# Patient Record
Sex: Female | Born: 1970 | Race: Black or African American | Hispanic: No | Smoking: Never smoker
Health system: Southern US, Community
[De-identification: ages and names within clinical notes are randomized; demographics above are authoritative.]

## PROBLEM LIST (undated history)

## (undated) DIAGNOSIS — C801 Malignant (primary) neoplasm, unspecified: Secondary | ICD-10-CM

## (undated) DIAGNOSIS — N200 Calculus of kidney: Secondary | ICD-10-CM

## (undated) DIAGNOSIS — D496 Neoplasm of unspecified behavior of brain: Secondary | ICD-10-CM

## (undated) DIAGNOSIS — J45909 Unspecified asthma, uncomplicated: Secondary | ICD-10-CM

## (undated) DIAGNOSIS — E079 Disorder of thyroid, unspecified: Secondary | ICD-10-CM

## (undated) DIAGNOSIS — E559 Vitamin D deficiency, unspecified: Secondary | ICD-10-CM

## (undated) DIAGNOSIS — R490 Dysphonia: Secondary | ICD-10-CM

## (undated) DIAGNOSIS — I10 Essential (primary) hypertension: Secondary | ICD-10-CM

## (undated) DIAGNOSIS — I2699 Other pulmonary embolism without acute cor pulmonale: Secondary | ICD-10-CM

## (undated) DIAGNOSIS — R519 Headache, unspecified: Secondary | ICD-10-CM

## (undated) DIAGNOSIS — IMO0002 Reserved for concepts with insufficient information to code with codable children: Secondary | ICD-10-CM

## (undated) DIAGNOSIS — N390 Urinary tract infection, site not specified: Secondary | ICD-10-CM

## (undated) DIAGNOSIS — IMO0001 Reserved for inherently not codable concepts without codable children: Secondary | ICD-10-CM

## (undated) DIAGNOSIS — E669 Obesity, unspecified: Secondary | ICD-10-CM

## (undated) DIAGNOSIS — O009 Unspecified ectopic pregnancy without intrauterine pregnancy: Secondary | ICD-10-CM

## (undated) DIAGNOSIS — F419 Anxiety disorder, unspecified: Secondary | ICD-10-CM

## (undated) DIAGNOSIS — D649 Anemia, unspecified: Secondary | ICD-10-CM

## (undated) DIAGNOSIS — T4145XA Adverse effect of unspecified anesthetic, initial encounter: Secondary | ICD-10-CM

## (undated) DIAGNOSIS — E215 Disorder of parathyroid gland, unspecified: Secondary | ICD-10-CM

## (undated) DIAGNOSIS — F32A Depression, unspecified: Secondary | ICD-10-CM

## (undated) DIAGNOSIS — D332 Benign neoplasm of brain, unspecified: Secondary | ICD-10-CM

## (undated) DIAGNOSIS — F329 Major depressive disorder, single episode, unspecified: Secondary | ICD-10-CM

## (undated) DIAGNOSIS — R51 Headache: Secondary | ICD-10-CM

## (undated) HISTORY — DX: Benign neoplasm of brain, unspecified: D33.2

## (undated) HISTORY — DX: Unspecified ectopic pregnancy without intrauterine pregnancy: O00.90

## (undated) HISTORY — PX: OTHER SURGICAL HISTORY: SHX169

## (undated) HISTORY — DX: Other pulmonary embolism without acute cor pulmonale: I26.99

## (undated) HISTORY — PX: LITHOTRIPSY: SUR834

## (undated) HISTORY — DX: Disorder of thyroid, unspecified: E07.9

## (undated) HISTORY — PX: CYSTOSCOPY W/ URETEROSCOPY: SUR379

---

## 1997-12-18 DIAGNOSIS — T8859XA Other complications of anesthesia, initial encounter: Secondary | ICD-10-CM

## 1997-12-18 HISTORY — DX: Other complications of anesthesia, initial encounter: T88.59XA

## 1997-12-18 HISTORY — PX: CHOLECYSTECTOMY: SHX55

## 2009-12-18 DIAGNOSIS — I2699 Other pulmonary embolism without acute cor pulmonale: Secondary | ICD-10-CM

## 2009-12-18 HISTORY — DX: Other pulmonary embolism without acute cor pulmonale: I26.99

## 2009-12-18 HISTORY — PX: OTHER SURGICAL HISTORY: SHX169

## 2009-12-18 HISTORY — PX: TUBAL LIGATION: SHX77

## 2010-10-14 ENCOUNTER — Emergency Department (HOSPITAL_COMMUNITY): Admission: EM | Admit: 2010-10-14 | Discharge: 2010-10-15 | Payer: Self-pay | Admitting: Emergency Medicine

## 2010-11-21 ENCOUNTER — Emergency Department (HOSPITAL_COMMUNITY): Admission: EM | Admit: 2010-11-21 | Discharge: 2010-11-21 | Payer: Self-pay | Admitting: Emergency Medicine

## 2010-12-15 ENCOUNTER — Emergency Department (HOSPITAL_COMMUNITY)
Admission: EM | Admit: 2010-12-15 | Discharge: 2010-12-15 | Payer: Self-pay | Source: Home / Self Care | Admitting: Emergency Medicine

## 2010-12-28 ENCOUNTER — Emergency Department (HOSPITAL_COMMUNITY)
Admission: EM | Admit: 2010-12-28 | Discharge: 2010-12-28 | Payer: Self-pay | Source: Home / Self Care | Admitting: Family Medicine

## 2011-01-02 LAB — POCT I-STAT, CHEM 8
BUN: 5 mg/dL — ABNORMAL LOW (ref 6–23)
Calcium, Ion: 1.36 mmol/L — ABNORMAL HIGH (ref 1.12–1.32)
Chloride: 105 mEq/L (ref 96–112)
Creatinine, Ser: 0.8 mg/dL (ref 0.4–1.2)
Glucose, Bld: 105 mg/dL — ABNORMAL HIGH (ref 70–99)
HCT: 43 % (ref 36.0–46.0)
Hemoglobin: 14.6 g/dL (ref 12.0–15.0)
Potassium: 4.2 mEq/L (ref 3.5–5.1)
Sodium: 141 mEq/L (ref 135–145)
TCO2: 30 mmol/L (ref 0–100)

## 2011-01-02 LAB — PROTIME-INR
INR: 1.38 (ref 0.00–1.49)
Prothrombin Time: 17.2 seconds — ABNORMAL HIGH (ref 11.6–15.2)

## 2011-02-06 ENCOUNTER — Ambulatory Visit: Payer: Self-pay

## 2011-02-07 ENCOUNTER — Emergency Department (HOSPITAL_COMMUNITY): Payer: Medicaid Other

## 2011-02-07 ENCOUNTER — Emergency Department (HOSPITAL_COMMUNITY)
Admission: EM | Admit: 2011-02-07 | Discharge: 2011-02-07 | Disposition: A | Discharge status: Home / Self Care | Payer: Medicaid Other | Attending: Emergency Medicine | Admitting: Emergency Medicine

## 2011-02-07 DIAGNOSIS — Z86711 Personal history of pulmonary embolism: Secondary | ICD-10-CM | POA: Insufficient documentation

## 2011-02-07 DIAGNOSIS — M545 Low back pain, unspecified: Secondary | ICD-10-CM | POA: Insufficient documentation

## 2011-02-07 DIAGNOSIS — E7800 Pure hypercholesterolemia, unspecified: Secondary | ICD-10-CM | POA: Insufficient documentation

## 2011-02-07 DIAGNOSIS — J45909 Unspecified asthma, uncomplicated: Secondary | ICD-10-CM | POA: Insufficient documentation

## 2011-02-07 DIAGNOSIS — R109 Unspecified abdominal pain: Secondary | ICD-10-CM | POA: Insufficient documentation

## 2011-02-07 DIAGNOSIS — E669 Obesity, unspecified: Secondary | ICD-10-CM | POA: Insufficient documentation

## 2011-02-07 DIAGNOSIS — E119 Type 2 diabetes mellitus without complications: Secondary | ICD-10-CM | POA: Insufficient documentation

## 2011-02-07 DIAGNOSIS — Z7901 Long term (current) use of anticoagulants: Secondary | ICD-10-CM | POA: Insufficient documentation

## 2011-02-07 DIAGNOSIS — I10 Essential (primary) hypertension: Secondary | ICD-10-CM | POA: Insufficient documentation

## 2011-02-07 DIAGNOSIS — Z79899 Other long term (current) drug therapy: Secondary | ICD-10-CM | POA: Insufficient documentation

## 2011-02-07 LAB — URINALYSIS, ROUTINE W REFLEX MICROSCOPIC
Bilirubin Urine: NEGATIVE
Hgb urine dipstick: NEGATIVE
Ketones, ur: NEGATIVE mg/dL
Nitrite: NEGATIVE
Protein, ur: NEGATIVE mg/dL
Specific Gravity, Urine: 1.021 (ref 1.005–1.030)
Urine Glucose, Fasting: NEGATIVE mg/dL
Urobilinogen, UA: 1 mg/dL (ref 0.0–1.0)
pH: 6.5 (ref 5.0–8.0)

## 2011-02-07 LAB — COMPREHENSIVE METABOLIC PANEL
ALT: 14 U/L (ref 0–35)
AST: 14 U/L (ref 0–37)
Albumin: 3.8 g/dL (ref 3.5–5.2)
Alkaline Phosphatase: 112 U/L (ref 39–117)
BUN: 8 mg/dL (ref 6–23)
CO2: 27 mEq/L (ref 19–32)
Calcium: 10.6 mg/dL — ABNORMAL HIGH (ref 8.4–10.5)
Chloride: 105 mEq/L (ref 96–112)
Creatinine, Ser: 0.71 mg/dL (ref 0.4–1.2)
GFR calc Af Amer: >60 mL/min (ref >60)
GFR calc non Af Amer: >60 mL/min (ref >60)
Glucose, Bld: 101 mg/dL — ABNORMAL HIGH (ref 70–99)
Potassium: 4.2 mEq/L (ref 3.5–5.1)
Sodium: 136 mEq/L (ref 135–145)
Total Bilirubin: 0.3 mg/dL (ref 0.3–1.2)
Total Protein: 8 g/dL (ref 6.0–8.3)

## 2011-02-07 LAB — CBC
HCT: 42.9 % (ref 36.0–46.0)
Hemoglobin: 13.3 g/dL (ref 12.0–15.0)
MCH: 26.8 pg (ref 26.0–34.0)
MCHC: 31 g/dL (ref 30.0–36.0)
MCV: 86.3 fL (ref 78.0–100.0)
Platelets: 363 10*3/uL (ref 150–400)
RBC: 4.97 MIL/uL (ref 3.87–5.11)
RDW: 14.4 % (ref 11.5–15.5)
WBC: 8.3 10*3/uL (ref 4.0–10.5)

## 2011-02-07 LAB — PROTIME-INR
INR: 1.09 (ref 0.00–1.49)
Prothrombin Time: 14.3 seconds (ref 11.6–15.2)

## 2011-02-07 LAB — DIFFERENTIAL
Basophils Absolute: 0.1 10*3/uL (ref 0.0–0.1)
Basophils Relative: 1 % (ref 0–1)
Eosinophils Absolute: 0.2 10*3/uL (ref 0.0–0.7)
Eosinophils Relative: 2 % (ref 0–5)
Lymphocytes Relative: 40 % (ref 12–46)
Lymphs Abs: 3.3 10*3/uL (ref 0.7–4.0)
Monocytes Absolute: 0.6 10*3/uL (ref 0.1–1.0)
Monocytes Relative: 8 % (ref 3–12)
Neutro Abs: 4.1 10*3/uL (ref 1.7–7.7)
Neutrophils Relative %: 49 % (ref 43–77)

## 2011-02-07 LAB — POCT PREGNANCY, URINE
Preg Test, Ur: NEGATIVE
Preg Test, Ur: NEGATIVE

## 2011-02-07 LAB — LIPASE, BLOOD: Lipase: 22 U/L (ref 11–59)

## 2011-02-20 ENCOUNTER — Emergency Department (HOSPITAL_COMMUNITY)
Admission: EM | Admit: 2011-02-20 | Discharge: 2011-02-21 | Disposition: A | Discharge status: Home / Self Care | Payer: Medicaid Other | Attending: Emergency Medicine | Admitting: Emergency Medicine

## 2011-02-20 DIAGNOSIS — H9209 Otalgia, unspecified ear: Secondary | ICD-10-CM | POA: Insufficient documentation

## 2011-02-20 DIAGNOSIS — J45909 Unspecified asthma, uncomplicated: Secondary | ICD-10-CM | POA: Insufficient documentation

## 2011-02-20 DIAGNOSIS — E119 Type 2 diabetes mellitus without complications: Secondary | ICD-10-CM | POA: Insufficient documentation

## 2011-02-20 DIAGNOSIS — Z7901 Long term (current) use of anticoagulants: Secondary | ICD-10-CM | POA: Insufficient documentation

## 2011-02-20 DIAGNOSIS — R070 Pain in throat: Secondary | ICD-10-CM | POA: Insufficient documentation

## 2011-02-20 DIAGNOSIS — E7800 Pure hypercholesterolemia, unspecified: Secondary | ICD-10-CM | POA: Insufficient documentation

## 2011-02-20 DIAGNOSIS — J020 Streptococcal pharyngitis: Secondary | ICD-10-CM | POA: Insufficient documentation

## 2011-02-20 DIAGNOSIS — J351 Hypertrophy of tonsils: Secondary | ICD-10-CM | POA: Insufficient documentation

## 2011-02-20 DIAGNOSIS — R599 Enlarged lymph nodes, unspecified: Secondary | ICD-10-CM | POA: Insufficient documentation

## 2011-02-20 DIAGNOSIS — R6883 Chills (without fever): Secondary | ICD-10-CM | POA: Insufficient documentation

## 2011-02-20 DIAGNOSIS — Z86718 Personal history of other venous thrombosis and embolism: Secondary | ICD-10-CM | POA: Insufficient documentation

## 2011-02-20 DIAGNOSIS — Z79899 Other long term (current) drug therapy: Secondary | ICD-10-CM | POA: Insufficient documentation

## 2011-02-20 DIAGNOSIS — R112 Nausea with vomiting, unspecified: Secondary | ICD-10-CM | POA: Insufficient documentation

## 2011-02-20 DIAGNOSIS — H6690 Otitis media, unspecified, unspecified ear: Secondary | ICD-10-CM | POA: Insufficient documentation

## 2011-02-20 DIAGNOSIS — R1310 Dysphagia, unspecified: Secondary | ICD-10-CM | POA: Insufficient documentation

## 2011-02-20 LAB — POCT I-STAT, CHEM 8
BUN: 7 mg/dL (ref 6–23)
Calcium, Ion: 1.38 mmol/L — ABNORMAL HIGH (ref 1.12–1.32)
Chloride: 105 mEq/L (ref 96–112)
Creatinine, Ser: 0.9 mg/dL (ref 0.4–1.2)
Glucose, Bld: 105 mg/dL — ABNORMAL HIGH (ref 70–99)
HCT: 40 % (ref 36.0–46.0)
Hemoglobin: 13.6 g/dL (ref 12.0–15.0)
Potassium: 4.3 mEq/L (ref 3.5–5.1)
Sodium: 139 mEq/L (ref 135–145)
TCO2: 25 mmol/L (ref 0–100)

## 2011-02-20 LAB — CBC
HCT: 38.2 % (ref 36.0–46.0)
Hemoglobin: 12.3 g/dL (ref 12.0–15.0)
MCH: 27.3 pg (ref 26.0–34.0)
MCHC: 32.2 g/dL (ref 30.0–36.0)
MCV: 84.9 fL (ref 78.0–100.0)
Platelets: 328 10*3/uL (ref 150–400)
RBC: 4.5 MIL/uL (ref 3.87–5.11)
RDW: 14.5 % (ref 11.5–15.5)
WBC: 12.2 10*3/uL — ABNORMAL HIGH (ref 4.0–10.5)

## 2011-02-20 LAB — DIFFERENTIAL
Basophils Absolute: 0.1 10*3/uL (ref 0.0–0.1)
Basophils Relative: 1 % (ref 0–1)
Eosinophils Absolute: 0.2 10*3/uL (ref 0.0–0.7)
Eosinophils Relative: 2 % (ref 0–5)
Lymphocytes Relative: 24 % (ref 12–46)
Lymphs Abs: 2.9 10*3/uL (ref 0.7–4.0)
Monocytes Absolute: 1.3 10*3/uL — ABNORMAL HIGH (ref 0.1–1.0)
Monocytes Relative: 11 % (ref 3–12)
Neutro Abs: 7.8 10*3/uL — ABNORMAL HIGH (ref 1.7–7.7)
Neutrophils Relative %: 64 % (ref 43–77)

## 2011-02-21 LAB — URINALYSIS, ROUTINE W REFLEX MICROSCOPIC
Bilirubin Urine: NEGATIVE
Glucose, UA: NEGATIVE mg/dL
Ketones, ur: NEGATIVE mg/dL
Leukocytes, UA: NEGATIVE
Nitrite: NEGATIVE
Protein, ur: NEGATIVE mg/dL
Specific Gravity, Urine: 1.018 (ref 1.005–1.030)
Urobilinogen, UA: 2 mg/dL — ABNORMAL HIGH (ref 0.0–1.0)
pH: 6.5 (ref 5.0–8.0)

## 2011-02-21 LAB — URINE MICROSCOPIC-ADD ON

## 2011-02-21 LAB — PROTIME-INR
INR: 1.13 (ref 0.00–1.49)
Prothrombin Time: 14.7 seconds (ref 11.6–15.2)

## 2011-02-21 LAB — POCT PREGNANCY, URINE: Preg Test, Ur: NEGATIVE

## 2011-02-27 LAB — URINE MICROSCOPIC-ADD ON

## 2011-02-27 LAB — DIFFERENTIAL
Basophils Absolute: 0 10*3/uL (ref 0.0–0.1)
Basophils Relative: 1 % (ref 0–1)
Eosinophils Absolute: 0.2 10*3/uL (ref 0.0–0.7)
Eosinophils Relative: 2 % (ref 0–5)
Lymphocytes Relative: 44 % (ref 12–46)
Lymphs Abs: 3.6 10*3/uL (ref 0.7–4.0)
Monocytes Absolute: 1 10*3/uL (ref 0.1–1.0)
Monocytes Relative: 12 % (ref 3–12)
Neutro Abs: 3.4 10*3/uL (ref 1.7–7.7)
Neutrophils Relative %: 42 % — ABNORMAL LOW (ref 43–77)

## 2011-02-27 LAB — POCT I-STAT, CHEM 8
BUN: 8 mg/dL (ref 6–23)
Calcium, Ion: 1.44 mmol/L — ABNORMAL HIGH (ref 1.12–1.32)
Chloride: 106 meq/L (ref 96–112)
Creatinine, Ser: 0.7 mg/dL (ref 0.4–1.2)
Glucose, Bld: 128 mg/dL — ABNORMAL HIGH (ref 70–99)
HCT: 42 % (ref 36.0–46.0)
Hemoglobin: 14.3 g/dL (ref 12.0–15.0)
Potassium: 4.1 meq/L (ref 3.5–5.1)
Sodium: 141 mEq/L (ref 135–145)
TCO2: 26 mmol/L (ref 0–100)

## 2011-02-27 LAB — POCT PREGNANCY, URINE: Preg Test, Ur: NEGATIVE

## 2011-02-27 LAB — URINALYSIS, ROUTINE W REFLEX MICROSCOPIC
Bilirubin Urine: NEGATIVE
Glucose, UA: NEGATIVE mg/dL
Ketones, ur: NEGATIVE mg/dL
Nitrite: NEGATIVE
Protein, ur: NEGATIVE mg/dL
Specific Gravity, Urine: 1.021 (ref 1.005–1.030)
Urobilinogen, UA: 1 mg/dL (ref 0.0–1.0)
pH: 7 (ref 5.0–8.0)

## 2011-02-27 LAB — LIPASE, BLOOD: Lipase: 27 U/L (ref 11–59)

## 2011-02-27 LAB — HEPATIC FUNCTION PANEL
ALT: 15 U/L (ref 0–35)
Total Protein: 7.4 g/dL (ref 6.0–8.3)

## 2011-02-27 LAB — CBC
HCT: 39.5 % (ref 36.0–46.0)
Hemoglobin: 12.7 g/dL (ref 12.0–15.0)
MCH: 27.7 pg (ref 26.0–34.0)
MCHC: 32.2 g/dL (ref 30.0–36.0)
MCV: 86.2 fL (ref 78.0–100.0)
Platelets: 375 10*3/uL (ref 150–400)
RBC: 4.58 MIL/uL (ref 3.87–5.11)
RDW: 14.7 % (ref 11.5–15.5)
WBC: 8.2 10*3/uL (ref 4.0–10.5)

## 2011-02-27 LAB — PROTIME-INR: INR: 1.21 (ref 0.00–1.49)

## 2011-03-01 LAB — DIFFERENTIAL
Basophils Absolute: 0 10*3/uL (ref 0.0–0.1)
Basophils Relative: 0 % (ref 0–1)
Eosinophils Absolute: 0.2 10*3/uL (ref 0.0–0.7)
Eosinophils Relative: 2 % (ref 0–5)
Lymphocytes Relative: 34 % (ref 12–46)
Lymphs Abs: 3.2 10*3/uL (ref 0.7–4.0)
Monocytes Absolute: 1.3 10*3/uL — ABNORMAL HIGH (ref 0.1–1.0)
Monocytes Relative: 13 % — ABNORMAL HIGH (ref 3–12)
Neutro Abs: 4.9 10*3/uL (ref 1.7–7.7)
Neutrophils Relative %: 51 % (ref 43–77)

## 2011-03-01 LAB — TROPONIN I: Troponin I: 0.02 ng/mL (ref 0.00–0.06)

## 2011-03-01 LAB — CBC
Hemoglobin: 12.1 g/dL (ref 12.0–15.0)
MCH: 27.1 pg (ref 26.0–34.0)
RBC: 4.46 MIL/uL (ref 3.87–5.11)
WBC: 9.6 10*3/uL (ref 4.0–10.5)

## 2011-03-01 LAB — URINALYSIS, ROUTINE W REFLEX MICROSCOPIC
Ketones, ur: NEGATIVE mg/dL
Leukocytes, UA: NEGATIVE
Nitrite: NEGATIVE
Protein, ur: NEGATIVE mg/dL

## 2011-03-01 LAB — PROTIME-INR
INR: 2.4 — ABNORMAL HIGH (ref 0.00–1.49)
Prothrombin Time: 26.3 seconds — ABNORMAL HIGH (ref 11.6–15.2)

## 2011-03-01 LAB — CK TOTAL AND CKMB (NOT AT ARMC)
CK, MB: 1.3 ng/mL (ref 0.3–4.0)
Total CK: 167 U/L (ref 7–177)

## 2011-03-01 LAB — BASIC METABOLIC PANEL
CO2: 29 mEq/L (ref 19–32)
Calcium: 10.1 mg/dL (ref 8.4–10.5)
Chloride: 108 mEq/L (ref 96–112)
GFR calc Af Amer: >60 mL/min (ref >60)
Sodium: 139 mEq/L (ref 135–145)

## 2011-03-01 LAB — URINE MICROSCOPIC-ADD ON

## 2011-03-01 LAB — APTT: aPTT: 40 seconds — ABNORMAL HIGH (ref 24–37)

## 2011-03-01 LAB — POCT PREGNANCY, URINE: Preg Test, Ur: NEGATIVE

## 2011-07-23 ENCOUNTER — Emergency Department (HOSPITAL_COMMUNITY): Payer: Medicaid Other

## 2011-07-23 ENCOUNTER — Emergency Department (HOSPITAL_COMMUNITY)
Admission: EM | Admit: 2011-07-23 | Discharge: 2011-07-23 | Disposition: A | Discharge status: Home / Self Care | Payer: Medicaid Other | Attending: Emergency Medicine | Admitting: Emergency Medicine

## 2011-07-23 DIAGNOSIS — N201 Calculus of ureter: Secondary | ICD-10-CM | POA: Insufficient documentation

## 2011-07-23 DIAGNOSIS — Z86718 Personal history of other venous thrombosis and embolism: Secondary | ICD-10-CM | POA: Insufficient documentation

## 2011-07-23 DIAGNOSIS — R109 Unspecified abdominal pain: Secondary | ICD-10-CM | POA: Insufficient documentation

## 2011-07-23 DIAGNOSIS — E119 Type 2 diabetes mellitus without complications: Secondary | ICD-10-CM | POA: Insufficient documentation

## 2011-07-23 DIAGNOSIS — Z7901 Long term (current) use of anticoagulants: Secondary | ICD-10-CM | POA: Insufficient documentation

## 2011-07-23 LAB — DIFFERENTIAL
Eosinophils Relative: 2 % (ref 0–5)
Lymphocytes Relative: 42 % (ref 12–46)
Lymphs Abs: 3.3 10*3/uL (ref 0.7–4.0)
Monocytes Absolute: 1.1 10*3/uL — ABNORMAL HIGH (ref 0.1–1.0)
Monocytes Relative: 13 % — ABNORMAL HIGH (ref 3–12)
Neutro Abs: 3.4 10*3/uL (ref 1.7–7.7)

## 2011-07-23 LAB — CBC
HCT: 39.5 % (ref 36.0–46.0)
Hemoglobin: 13.1 g/dL (ref 12.0–15.0)
MCH: 27.6 pg (ref 26.0–34.0)
MCHC: 33.2 g/dL (ref 30.0–36.0)
MCV: 83.2 fL (ref 78.0–100.0)
RDW: 14.6 % (ref 11.5–15.5)

## 2011-07-23 LAB — URINALYSIS, ROUTINE W REFLEX MICROSCOPIC
Nitrite: NEGATIVE
Specific Gravity, Urine: 1.027 (ref 1.005–1.030)
Urobilinogen, UA: 1 mg/dL (ref 0.0–1.0)
pH: 5 (ref 5.0–8.0)

## 2011-07-23 LAB — URINE MICROSCOPIC-ADD ON

## 2011-07-23 LAB — BASIC METABOLIC PANEL
BUN: 10 mg/dL (ref 6–23)
Creatinine, Ser: 0.78 mg/dL (ref 0.50–1.10)
GFR calc Af Amer: >60 mL/min (ref >60)
GFR calc non Af Amer: >60 mL/min (ref >60)
Glucose, Bld: 141 mg/dL — ABNORMAL HIGH (ref 70–99)

## 2011-07-23 LAB — GLUCOSE, CAPILLARY
Glucose-Capillary: 138 mg/dL — ABNORMAL HIGH (ref 70–99)
Glucose-Capillary: 111 mg/dL — ABNORMAL HIGH (ref 70–99)

## 2011-08-31 ENCOUNTER — Ambulatory Visit: Payer: Medicaid Other | Attending: Family Medicine | Admitting: Physical Therapy

## 2011-09-28 ENCOUNTER — Ambulatory Visit: Payer: Medicaid Other | Admitting: Physical Therapy

## 2011-10-23 ENCOUNTER — Ambulatory Visit: Payer: Medicaid Other | Attending: Family Medicine | Admitting: Physical Therapy

## 2011-10-23 DIAGNOSIS — M25559 Pain in unspecified hip: Secondary | ICD-10-CM | POA: Insufficient documentation

## 2011-10-23 DIAGNOSIS — M545 Low back pain, unspecified: Secondary | ICD-10-CM | POA: Insufficient documentation

## 2011-10-23 DIAGNOSIS — IMO0001 Reserved for inherently not codable concepts without codable children: Secondary | ICD-10-CM | POA: Insufficient documentation

## 2011-10-31 ENCOUNTER — Ambulatory Visit: Payer: Medicaid Other | Admitting: Physical Therapy

## 2011-11-01 ENCOUNTER — Encounter: Payer: Medicaid Other | Admitting: Physical Therapy

## 2012-07-20 ENCOUNTER — Encounter (HOSPITAL_COMMUNITY): Payer: Self-pay | Admitting: *Deleted

## 2012-07-20 ENCOUNTER — Other Ambulatory Visit (HOSPITAL_COMMUNITY): Payer: Self-pay

## 2012-07-20 ENCOUNTER — Emergency Department (HOSPITAL_COMMUNITY)
Admission: EM | Admit: 2012-07-20 | Discharge: 2012-07-20 | Disposition: A | Discharge status: Home / Self Care | Payer: Medicaid Other | Attending: Emergency Medicine | Admitting: Emergency Medicine

## 2012-07-20 ENCOUNTER — Emergency Department (HOSPITAL_COMMUNITY): Payer: Medicaid Other

## 2012-07-20 DIAGNOSIS — R10819 Abdominal tenderness, unspecified site: Secondary | ICD-10-CM | POA: Insufficient documentation

## 2012-07-20 DIAGNOSIS — R110 Nausea: Secondary | ICD-10-CM

## 2012-07-20 DIAGNOSIS — R109 Unspecified abdominal pain: Secondary | ICD-10-CM | POA: Insufficient documentation

## 2012-07-20 DIAGNOSIS — N200 Calculus of kidney: Secondary | ICD-10-CM | POA: Insufficient documentation

## 2012-07-20 HISTORY — DX: Urinary tract infection, site not specified: N39.0

## 2012-07-20 HISTORY — DX: Obesity, unspecified: E66.9

## 2012-07-20 HISTORY — DX: Calculus of kidney: N20.0

## 2012-07-20 LAB — COMPREHENSIVE METABOLIC PANEL
AST: 12 U/L (ref 0–37)
CO2: 27 mEq/L (ref 19–32)
Calcium: 10.7 mg/dL — ABNORMAL HIGH (ref 8.4–10.5)
Creatinine, Ser: 0.72 mg/dL (ref 0.50–1.10)
GFR calc Af Amer: >90 mL/min (ref >90)
GFR calc non Af Amer: >90 mL/min (ref >90)
Total Protein: 7.5 g/dL (ref 6.0–8.3)

## 2012-07-20 LAB — URINALYSIS, ROUTINE W REFLEX MICROSCOPIC
Glucose, UA: NEGATIVE mg/dL
Nitrite: NEGATIVE
Specific Gravity, Urine: 1.026 (ref 1.005–1.030)
pH: 6.5 (ref 5.0–8.0)

## 2012-07-20 LAB — CBC WITH DIFFERENTIAL/PLATELET
Basophils Absolute: 0.1 10*3/uL (ref 0.0–0.1)
Eosinophils Absolute: 0.2 10*3/uL (ref 0.0–0.7)
Eosinophils Relative: 3 % (ref 0–5)
HCT: 39.4 % (ref 36.0–46.0)
Lymphocytes Relative: 39 % (ref 12–46)
MCH: 28.1 pg (ref 26.0–34.0)
MCHC: 33 g/dL (ref 30.0–36.0)
MCV: 85.1 fL (ref 78.0–100.0)
Monocytes Absolute: 0.7 10*3/uL (ref 0.1–1.0)
RDW: 14.1 % (ref 11.5–15.5)
WBC: 7.1 10*3/uL (ref 4.0–10.5)

## 2012-07-20 LAB — URINE MICROSCOPIC-ADD ON

## 2012-07-20 MED ORDER — HYDROMORPHONE HCL PF 1 MG/ML IJ SOLN
1.0000 mg | Freq: Once | INTRAMUSCULAR | Status: AC
  Administered 2012-07-20: 1 mg via INTRAVENOUS
  Filled 2012-07-20: qty 1

## 2012-07-20 MED ORDER — SODIUM CHLORIDE 0.9 % IV BOLUS (SEPSIS)
500.0000 mL | Freq: Once | INTRAVENOUS | Status: AC
  Administered 2012-07-20: 500 mL via INTRAVENOUS

## 2012-07-20 MED ORDER — OXYCODONE-ACETAMINOPHEN 5-325 MG PO TABS: 1.0000 | ORAL_TABLET | Freq: Four times a day (QID) | ORAL | Status: AC | PRN

## 2012-07-20 MED ORDER — ONDANSETRON HCL 4 MG/2ML IJ SOLN
INTRAMUSCULAR | Status: AC
  Administered 2012-07-20: 4 mg via INTRAVENOUS
  Filled 2012-07-20: qty 2

## 2012-07-20 MED ORDER — ONDANSETRON HCL 4 MG PO TABS: 4.0000 mg | ORAL_TABLET | Freq: Four times a day (QID) | ORAL | Status: AC

## 2012-07-20 MED ORDER — ONDANSETRON HCL 4 MG/2ML IJ SOLN
4.0000 mg | Freq: Once | INTRAMUSCULAR | Status: AC
  Administered 2012-07-20: 4 mg via INTRAVENOUS

## 2012-07-20 NOTE — ED Notes (Signed)
Pt reports lower abd pain and left side pain for a week that has become progressively worse. Denies vaginal symptoms, does report mild pain with urination. Hx of UTI and kidney stones.

## 2012-07-20 NOTE — ED Provider Notes (Signed)
History     CSN: [id number]  Arrival date & time 07/20/12  1242   First MD Initiated Contact with Patient 07/20/12 1251      Chief Complaint  Patient presents with  . Abdominal Pain  . Flank Pain    (Consider location/radiation/quality/duration/timing/severity/associated sxs/prior treatment) HPI Comments: 41 y/o female presents with left sided back and flank pain x 1 week. Pain has worsened over the past couple days. Rates pain 8/10. She admits to history of kidney stones, last one diagnosed Aug 2012. Denies dysuria, but states she has an uncomfortable feeling when she urinates. Admits to increased frequency, urgency, and nausea over the past 4 days. Denies hematuria, vomiting, fever, chills, chest pain, sob, vaginal bleeding or discharge.  Patient is a 41 y.o. female presenting with abdominal pain and flank pain. The history is provided by the patient.  Abdominal Pain The primary symptoms of the illness include abdominal pain and nausea. The primary symptoms of the illness do not include fever, shortness of breath, dysuria, vaginal discharge or vaginal bleeding.  Additional symptoms associated with the illness include urgency, frequency and back pain. Symptoms associated with the illness do not include chills.  Flank Pain Associated symptoms include abdominal pain and nausea. Pertinent negatives include no chest pain, chills or fever.    Past Medical History  Diagnosis Date  . Kidney stone   . UTI (urinary tract infection)   . Obesity     History reviewed. No pertinent past surgical history.  History reviewed. No pertinent family history.  History  Substance Use Topics  . Smoking status: Not on file  . Smokeless tobacco: Not on file  . Alcohol Use: No    OB History    Grav Para Term Preterm Abortions TAB SAB Ect Mult Living                  Review of Systems  Constitutional: Negative for fever and chills.  Respiratory: Negative for shortness of breath.     Cardiovascular: Negative for chest pain.  Gastrointestinal: Positive for nausea and abdominal pain.  Genitourinary: Positive for urgency, frequency and flank pain. Negative for dysuria, vaginal bleeding, vaginal discharge and pelvic pain.  Musculoskeletal: Positive for back pain.    Allergies  Review of patient's allergies indicates not on file.  Home Medications  No current outpatient prescriptions on file.  BP 159/90  Pulse 106  Temp 98.1 F (36.7 C) (Oral)  Resp 20  SpO2 97%  LMP 07/11/2012  Physical Exam  Nursing note and vitals reviewed. Constitutional: She is oriented to person, place, and time. She appears well-developed and well-nourished. No distress.  HENT:  Head: Normocephalic and atraumatic.  Mouth/Throat: Oropharynx is clear and moist.  Eyes: Conjunctivae are normal.  Neck: Neck supple.  Cardiovascular: Normal rate, regular rhythm and normal heart sounds.   Pulmonary/Chest: Effort normal and breath sounds normal.  Abdominal: Soft. Bowel sounds are normal. There is tenderness. There is CVA tenderness (on left). There is no rigidity, no rebound and no guarding.       Positive suprapubic tenderness to deep palpation. Positive left flank tanderness.  Neurological: She is alert and oriented to person, place, and time.  Skin: Skin is warm and dry. No rash noted.  Psychiatric: She has a normal mood and affect. Her behavior is normal.    ED Course  Procedures (including critical care time)  1:58 PM Patient no longer nauseated after receiving zofran. Still in some pain.  Labs Reviewed  URINALYSIS,  ROUTINE W REFLEX MICROSCOPIC  URINE CULTURE  CBC WITH DIFFERENTIAL  COMPREHENSIVE METABOLIC PANEL   Results for orders placed during the hospital encounter of 07/20/12  URINALYSIS, ROUTINE W REFLEX MICROSCOPIC      Component Value Range   Color, Urine YELLOW  YELLOW   APPearance TURBID (*) CLEAR   Specific Gravity, Urine 1.026  1.005 - 1.030   pH 6.5  5.0 - 8.0    Glucose, UA NEGATIVE  NEGATIVE mg/dL   Hgb urine dipstick NEGATIVE  NEGATIVE   Bilirubin Urine NEGATIVE  NEGATIVE   Ketones, ur NEGATIVE  NEGATIVE mg/dL   Protein, ur NEGATIVE  NEGATIVE mg/dL   Urobilinogen, UA 1.0  0.0 - 1.0 mg/dL   Nitrite NEGATIVE  NEGATIVE   Leukocytes, UA SMALL (*) NEGATIVE  CBC WITH DIFFERENTIAL      Component Value Range   WBC 7.1  4.0 - 10.5 K/uL   RBC 4.63  3.87 - 5.11 MIL/uL   Hemoglobin 13.0  12.0 - 15.0 g/dL   HCT 16.1  09.6 - 04.5 %   MCV 85.1  78.0 - 100.0 fL   MCH 28.1  26.0 - 34.0 pg   MCHC 33.0  30.0 - 36.0 g/dL   RDW 40.9  81.1 - 91.4 %   Platelets 335  150 - 400 K/uL   Neutrophils Relative 48  43 - 77 %   Neutro Abs 3.4  1.7 - 7.7 K/uL   Lymphocytes Relative 39  12 - 46 %   Lymphs Abs 2.8  0.7 - 4.0 K/uL   Monocytes Relative 9  3 - 12 %   Monocytes Absolute 0.7  0.1 - 1.0 K/uL   Eosinophils Relative 3  0 - 5 %   Eosinophils Absolute 0.2  0.0 - 0.7 K/uL   Basophils Relative 1  0 - 1 %   Basophils Absolute 0.1  0.0 - 0.1 K/uL  COMPREHENSIVE METABOLIC PANEL      Component Value Range   Sodium 140  135 - 145 mEq/L   Potassium 4.2  3.5 - 5.1 mEq/L   Chloride 106  96 - 112 mEq/L   CO2 27  19 - 32 mEq/L   Glucose, Bld 132 (*) 70 - 99 mg/dL   BUN 7  6 - 23 mg/dL   Creatinine, Ser 7.82  0.50 - 1.10 mg/dL   Calcium 95.6 (*) 8.4 - 10.5 mg/dL   Total Protein 7.5  6.0 - 8.3 g/dL   Albumin 3.5  3.5 - 5.2 g/dL   AST 12  0 - 37 U/L   ALT 9  0 - 35 U/L   Alkaline Phosphatase 130 (*) 39 - 117 U/L   Total Bilirubin 0.3  0.3 - 1.2 mg/dL   GFR calc non Af Amer >90  >90 mL/min   GFR calc Af Amer >90  >90 mL/min  URINE MICROSCOPIC-ADD ON      Component Value Range   Squamous Epithelial / LPF MANY (*) RARE   WBC, UA 3-6  <3 WBC/hpf   Urine-Other MUCOUS PRESENT      Ct Abdomen Pelvis Wo Contrast  07/20/2012  *RADIOLOGY REPORT*  Clinical Data: Flank pain.  CT ABDOMEN AND PELVIS WITHOUT CONTRAST  Technique:  Multidetector CT imaging of the abdomen and  pelvis was performed following the standard protocol without intravenous contrast.  Comparison: 07/23/2011  Findings:  Lung bases are clear.  No pericardial or pleural effusion.  No focal liver abnormalities. Prior cholecystectomy.  The spleen  appears within normal limits. The pancreas is negative.  Both adrenal glands are unremarkable.  The right kidney is normal. No right-sided hydronephrosis or nephrolithiasis.  There is no right hydroureter or ureterolithiasis.  Two stones are identified within the upper pole collecting system of the left kidney.  These measure up to 3 mm, image 37.  There is no left-sided hydronephrosis or hydroureter.  The urinary bladder appears normal.  The uterus and adnexal structures are unremarkable.  There are no enlarged upper abdominal lymph nodes.  There is no pelvic or inguinal adenopathy.  No free fluid or fluid collections identified within the abdomen or the pelvis.  The stomach appears normal.  The small bowel loops are unremarkable.  The appendix is identified and appears normal.  Normal appearance of the colon.  Review of the visualized osseous structures is unremarkable.  IMPRESSION:  1.  No acute findings identified within the abdomen or the pelvis. 2.  Nonobstructing left renal calculi.  Original Report Authenticated By: Rosealee Albee, M.D.     1. Renal calculi   2. Nausea       MDM  41 y/o female with left sided flank pain. CT showing non obstructive left renal calculi. No evidence of UTI. Patient will be discharged with pain control, zofran, and instructions to follow up with urology. Case discussed with Dr. Chaney Malling who agrees with plan of care.       Trevor Mace, PA-C 07/20/12 1455

## 2012-07-20 NOTE — ED Provider Notes (Signed)
Medical screening examination/treatment/procedure(s) were performed by non-physician practitioner and as supervising physician I was immediately available for consultation/collaboration.   Richardean Canal, MD 07/20/12 [phone]

## 2012-07-21 LAB — URINE CULTURE

## 2012-09-04 ENCOUNTER — Other Ambulatory Visit: Payer: Self-pay | Admitting: Ophthalmology

## 2012-09-04 DIAGNOSIS — H469 Unspecified optic neuritis: Secondary | ICD-10-CM

## 2012-09-08 ENCOUNTER — Other Ambulatory Visit: Payer: Self-pay

## 2012-09-14 ENCOUNTER — Ambulatory Visit
Admission: RE | Admit: 2012-09-14 | Discharge: 2012-09-14 | Disposition: A | Discharge status: Home / Self Care | Payer: Medicaid Other | Source: Ambulatory Visit | Attending: Ophthalmology | Admitting: Ophthalmology

## 2012-09-14 DIAGNOSIS — H469 Unspecified optic neuritis: Secondary | ICD-10-CM

## 2012-09-14 MED ORDER — GADOBENATE DIMEGLUMINE 529 MG/ML IV SOLN
20.0000 mL | Freq: Once | INTRAVENOUS | Status: AC | PRN
  Administered 2012-09-14: 20 mL via INTRAVENOUS

## 2012-10-01 ENCOUNTER — Encounter (HOSPITAL_COMMUNITY): Payer: Self-pay | Admitting: Emergency Medicine

## 2012-10-01 ENCOUNTER — Emergency Department (HOSPITAL_COMMUNITY)
Admission: EM | Admit: 2012-10-01 | Discharge: 2012-10-02 | Disposition: A | Discharge status: Home / Self Care | Payer: Medicaid Other | Attending: Emergency Medicine | Admitting: Emergency Medicine

## 2012-10-01 DIAGNOSIS — R109 Unspecified abdominal pain: Secondary | ICD-10-CM | POA: Insufficient documentation

## 2012-10-01 DIAGNOSIS — R51 Headache: Secondary | ICD-10-CM | POA: Insufficient documentation

## 2012-10-01 DIAGNOSIS — R42 Dizziness and giddiness: Secondary | ICD-10-CM | POA: Insufficient documentation

## 2012-10-01 DIAGNOSIS — N390 Urinary tract infection, site not specified: Secondary | ICD-10-CM | POA: Insufficient documentation

## 2012-10-01 LAB — COMPREHENSIVE METABOLIC PANEL
AST: 11 U/L (ref 0–37)
Albumin: 3.6 g/dL (ref 3.5–5.2)
Alkaline Phosphatase: 130 U/L — ABNORMAL HIGH (ref 39–117)
Chloride: 101 mEq/L (ref 96–112)
Potassium: 3.7 mEq/L (ref 3.5–5.1)
Total Bilirubin: 0.2 mg/dL — ABNORMAL LOW (ref 0.3–1.2)

## 2012-10-01 LAB — URINALYSIS, MICROSCOPIC ONLY
Glucose, UA: NEGATIVE mg/dL
Hgb urine dipstick: NEGATIVE
Ketones, ur: NEGATIVE mg/dL
Protein, ur: NEGATIVE mg/dL

## 2012-10-01 LAB — CBC WITH DIFFERENTIAL/PLATELET
Basophils Absolute: 0.1 10*3/uL (ref 0.0–0.1)
Basophils Relative: 1 % (ref 0–1)
Hemoglobin: 12.8 g/dL (ref 12.0–15.0)
MCHC: 32.4 g/dL (ref 30.0–36.0)
Neutro Abs: 4.5 10*3/uL (ref 1.7–7.7)
Neutrophils Relative %: 49 % (ref 43–77)
RDW: 14.2 % (ref 11.5–15.5)

## 2012-10-01 MED ORDER — METOCLOPRAMIDE HCL 5 MG/ML IJ SOLN
10.0000 mg | Freq: Once | INTRAMUSCULAR | Status: AC
  Administered 2012-10-02: 10 mg via INTRAVENOUS
  Filled 2012-10-01: qty 2

## 2012-10-01 MED ORDER — MORPHINE SULFATE 4 MG/ML IJ SOLN
4.0000 mg | Freq: Once | INTRAMUSCULAR | Status: AC
  Administered 2012-10-02: 4 mg via INTRAVENOUS
  Filled 2012-10-01: qty 1

## 2012-10-01 MED ORDER — ONDANSETRON HCL 4 MG/2ML IJ SOLN
4.0000 mg | Freq: Once | INTRAMUSCULAR | Status: AC
  Administered 2012-10-02: 4 mg via INTRAVENOUS
  Filled 2012-10-01: qty 2

## 2012-10-01 NOTE — ED Provider Notes (Signed)
History     CSN: [id number]  Arrival date & time 10/01/12  2032   First MD Initiated Contact with Patient 10/01/12 2308      Chief Complaint  Patient presents with  . Flank Pain    (Consider location/radiation/quality/duration/timing/severity/associated sxs/prior treatment) HPI History provided by pt and prior chart.  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  Pt reports that she has had chronic, brief shooting pains on the left side of her head but pain more severe and constant and covering entire left side of head since waking this morning.  Associated w/ worse than baseline blurred vision on left, tingling on left side of face and loss of balance.  Denies fever, nasal congestion, rhinorrhea.  Also c/o acute on chronic L flank pain.  Pain present x 3 weeks but also acutely worsened today.  Increased urinary frequency this evening but otherwise no urinary sx, as well as nausea.  Pain feels similar to pas kidney stones.  Denies trauma.    Past Medical History  Diagnosis Date  . Kidney stone   . UTI (urinary tract infection)   . Obesity     History reviewed. No pertinent past surgical history.  No family history on file.  History  Substance Use Topics  . Smoking status: Not on file  . Smokeless tobacco: Not on file  . Alcohol Use: No    OB History    Grav Para Term Preterm Abortions TAB SAB Ect Mult Living                  Review of Systems  All other systems reviewed and are negative.    Allergies  Penicillins  Home Medications   Current Outpatient Rx  Name Route Sig Dispense Refill  . ACETAMINOPHEN 500 MG PO TABS Oral Take 1,000 mg by mouth every 6 (six) hours as needed. For pain.    . ALBUTEROL SULFATE HFA 108 (90 BASE) MCG/ACT IN AERS Inhalation Inhale 2 puffs into the lungs every 6 (six) hours as needed. Shortness of breath    .  LISINOPRIL-HYDROCHLOROTHIAZIDE 20-12.5 MG PO TABS Oral Take 1 tablet by mouth daily.    Marland Kitchen PRESCRIPTION MEDICATION Oral Take 1 tablet by mouth at bedtime. Pravastatin    . SERTRALINE HCL 50 MG PO TABS Oral Take 50 mg by mouth daily.      BP 124/72  Pulse 73  Temp 97.8 F (36.6 C) (Oral)  Resp 20  SpO2 98%  Physical Exam  Nursing note and vitals reviewed. Constitutional: She is oriented to person, place, and time. She appears well-developed and well-nourished. No distress.       Morbidly obese  HENT:  Head: Normocephalic and atraumatic.  Eyes:       Normal appearance.  No ptosis or conjunctival injection  Neck: Normal range of motion.  Cardiovascular: Normal rate, regular rhythm and intact distal pulses.   Pulmonary/Chest: Effort normal and breath sounds normal.  Abdominal: Soft. Bowel sounds are normal. She exhibits no distension and no mass. There is no tenderness. There is no rebound and no guarding.  Genitourinary:       L CVA ttp  Musculoskeletal: Normal range of motion.  Neurological: She is alert and oriented to person, place, and time. No sensory deficit. Coordination normal.       CN 3-12 intact.  No nystagmus.  5/5 and equal upper  and lower extremity strength.  No past pointing.    Skin: Skin is warm and dry. No rash noted.  Psychiatric: She has a normal mood and affect. Her behavior is normal.    ED Course  Procedures (including critical care time)  Labs Reviewed  COMPREHENSIVE METABOLIC PANEL - Abnormal; Notable for the following:    Sodium 134 (*)     Glucose, Bld 100 (*)     Calcium 10.9 (*)     Alkaline Phosphatase 130 (*)     Total Bilirubin 0.2 (*)     All other components within normal limits  URINALYSIS, MICROSCOPIC ONLY - Abnormal; Notable for the following:    APPearance CLOUDY (*)     Bacteria, UA MANY (*)     Squamous Epithelial / LPF FEW (*)     All other components within normal limits  CBC WITH DIFFERENTIAL   Ct Head Wo Contrast  10/02/2012   *RADIOLOGY REPORT*  Clinical Data: Dizziness, headaches, tinnitus.  Diagnosed with a tumor on Saturday.  CT HEAD WITHOUT CONTRAST  Technique:  Contiguous axial images were obtained from the base of the skull through the vertex without contrast.  Comparison: MRI brain 09/14/2012  Findings: Increased density in the left sphenoid bone consistent with known meningioma.  This is better seen on the prior MR images. Intracranial contents demonstrate no mass effect or midline shift. No abnormal extra-axial fluid collections.  Gray-white matter junctions are distinct.  Basal cisterns are not effaced.  No ventricular dilatation.  No evidence of acute intracranial hemorrhage.  No depressed skull fractures.  Visualized paranasal sinuses and mastoid air cells are not opacified.  Vascular calcifications.  IMPRESSION: Left sphenoid wing meningioma is better demonstrated on previous MR examination.  No acute intracranial abnormalities.   Original Report Authenticated By: Marlon Pel, M.D.      1. Headache   2. Flank pain   3. UTI (lower urinary tract infection)       MDM  41yo F presents w/ 2 separate complaints.  Recently diagnosed w/ spenoid meningioma and had acute worsening of head pain and left-sided blurred vision yesterday.  On exam, pt well-appearing, afebrile, no focal neuro deficits or meningeal signs.  Had relief of pain w/ IV NS, reglan, benadryl and decadron but now reports feeling "woozy".  This is likely d/t the benadryl.  Will feed and continue to monitor.  Also c/o acute on chronic left flank pain x several weeks.  H/o kidney stones.  CT abd/pelvis for same sx in 07/2012 that showed non-obstructive nephrolithasis.  Pain reproducible w/ palpation but not movement on exam.  U/A positive for many bacteria.  Will treat w/ cipro for UTI and possible early pyelo.  Pt will be discharged home w/ same + hydrocodone and zofran.  I advised her to f/u with her neurologist at [facility] as scheduled, follow up  with her primary care doctor for further evaluation of flank pain, and return to ER if her headache or flank pain worsen, she develops associated fever or uncontrolled vomiting.    Pt reports that her headache has completely resolved, her left flank pain is very mild and she is no longer feeling woozy.  VSS.  Discharged home.   4:15 AM        Otilio Miu, PA 10/02/12 [phone]

## 2012-10-01 NOTE — ED Notes (Signed)
Pt alert, arrives from home, c/o left flank pain, onset a month ago, hx of kidney stone, c/o nausea, no emesis, resp even unlabored, skin pwd

## 2012-10-01 NOTE — ED Notes (Signed)
Pt states she was diagnosed with a tumor on Saturday and started having dizziness, HA, and tinnitus in ears today. Also c/o left flank pain reports hx of kidney stones.

## 2012-10-02 ENCOUNTER — Emergency Department (HOSPITAL_COMMUNITY): Payer: Medicaid Other

## 2012-10-02 MED ORDER — CIPROFLOXACIN HCL 500 MG PO TABS: 500.0000 mg | ORAL_TABLET | Freq: Two times a day (BID) | ORAL | Status: DC

## 2012-10-02 MED ORDER — DEXTROSE 5 % IV SOLN
1.0000 g | Freq: Once | INTRAVENOUS | Status: AC
  Administered 2012-10-02: 1 g via INTRAVENOUS
  Filled 2012-10-02: qty 10

## 2012-10-02 MED ORDER — HYDROCODONE-ACETAMINOPHEN 5-325 MG PO TABS: 1.0000 | ORAL_TABLET | ORAL | Status: DC | PRN

## 2012-10-02 MED ORDER — ONDANSETRON HCL 8 MG PO TABS: 8.0000 mg | ORAL_TABLET | Freq: Three times a day (TID) | ORAL | Status: DC | PRN

## 2012-10-02 NOTE — ED Provider Notes (Signed)
Medical screening examination/treatment/procedure(s) were performed by non-physician practitioner and as supervising physician I was immediately available for consultation/collaboration.   Harper Smoker L Eoghan Belcher, MD 10/02/12 0741 

## 2012-10-18 ENCOUNTER — Emergency Department (HOSPITAL_COMMUNITY)
Admission: EM | Admit: 2012-10-18 | Discharge: 2012-10-18 | Disposition: A | Discharge status: Home / Self Care | Payer: Medicaid Other | Attending: Emergency Medicine | Admitting: Emergency Medicine

## 2012-10-18 ENCOUNTER — Encounter (HOSPITAL_COMMUNITY): Payer: Self-pay | Admitting: Emergency Medicine

## 2012-10-18 ENCOUNTER — Emergency Department (HOSPITAL_COMMUNITY): Payer: Medicaid Other

## 2012-10-18 DIAGNOSIS — E669 Obesity, unspecified: Secondary | ICD-10-CM | POA: Insufficient documentation

## 2012-10-18 DIAGNOSIS — Z79899 Other long term (current) drug therapy: Secondary | ICD-10-CM | POA: Insufficient documentation

## 2012-10-18 DIAGNOSIS — R112 Nausea with vomiting, unspecified: Secondary | ICD-10-CM | POA: Insufficient documentation

## 2012-10-18 DIAGNOSIS — Z87440 Personal history of urinary (tract) infections: Secondary | ICD-10-CM | POA: Insufficient documentation

## 2012-10-18 DIAGNOSIS — Z791 Long term (current) use of non-steroidal anti-inflammatories (NSAID): Secondary | ICD-10-CM | POA: Insufficient documentation

## 2012-10-18 DIAGNOSIS — N200 Calculus of kidney: Secondary | ICD-10-CM | POA: Insufficient documentation

## 2012-10-18 LAB — URINALYSIS, DIPSTICK ONLY
Bilirubin Urine: NEGATIVE
Glucose, UA: NEGATIVE mg/dL
Nitrite: NEGATIVE
Specific Gravity, Urine: 1.027 (ref 1.005–1.030)
pH: 6 (ref 5.0–8.0)

## 2012-10-18 MED ORDER — HYDROMORPHONE HCL PF 1 MG/ML IJ SOLN
1.0000 mg | Freq: Once | INTRAMUSCULAR | Status: AC
Start: 2012-10-18 — End: 2012-10-18
  Administered 2012-10-18: 1 mg via INTRAVENOUS
  Filled 2012-10-18: qty 1

## 2012-10-18 MED ORDER — TAMSULOSIN HCL 0.4 MG PO CAPS: 0.4000 mg | ORAL_CAPSULE | Freq: Two times a day (BID) | ORAL | Status: DC

## 2012-10-18 MED ORDER — PROMETHAZINE HCL 25 MG PO TABS: 25.0000 mg | ORAL_TABLET | Freq: Four times a day (QID) | ORAL | Status: DC | PRN

## 2012-10-18 MED ORDER — KETOROLAC TROMETHAMINE 30 MG/ML IJ SOLN
30.0000 mg | Freq: Once | INTRAMUSCULAR | Status: AC
  Administered 2012-10-18: 30 mg via INTRAVENOUS
  Filled 2012-10-18: qty 1

## 2012-10-18 MED ORDER — NAPROXEN 500 MG PO TABS: 500.0000 mg | ORAL_TABLET | Freq: Two times a day (BID) | ORAL | Status: DC

## 2012-10-18 MED ORDER — HYDROCODONE-ACETAMINOPHEN 5-500 MG PO TABS: 1.0000 | ORAL_TABLET | Freq: Four times a day (QID) | ORAL | Status: DC | PRN

## 2012-10-18 MED ORDER — HYDROMORPHONE HCL PF 1 MG/ML IJ SOLN
1.0000 mg | Freq: Once | INTRAMUSCULAR | Status: AC
  Administered 2012-10-18: 1 mg via INTRAVENOUS
  Filled 2012-10-18: qty 1

## 2012-10-18 MED ORDER — ONDANSETRON 4 MG PO TBDP: 4.0000 mg | ORAL_TABLET | Freq: Three times a day (TID) | ORAL | Status: DC | PRN

## 2012-10-18 MED ORDER — ONDANSETRON HCL 4 MG/2ML IJ SOLN
4.0000 mg | Freq: Once | INTRAMUSCULAR | Status: AC
  Administered 2012-10-18: 4 mg via INTRAVENOUS
  Filled 2012-10-18: qty 2

## 2012-10-18 NOTE — ED Provider Notes (Signed)
History     CSN: [id number]  Arrival date & time 10/18/12  0110   First MD Initiated Contact with Patient 10/18/12 0146      Chief Complaint  Patient presents with  . Flank Pain  . Emesis    (Consider location/radiation/quality/duration/timing/severity/associated sxs/prior treatment) HPI Comments: 41 year old female with a history of recurrent nephrolithiasis who presents with approximately one hour of left-sided flank pain which radiates to the left groin. This is sharp in nature, intermittent and fluctuates in intensity, nothing makes it better or worse and it is associated with nausea and vomiting prior to arrival. At this time the pain has eased off somewhat. She does have a history of needing to have lithotripsy as well as surgery in the past for kidney stones it did not progress. She has had no medication prior to arrival  Patient is a 41 y.o. female presenting with flank pain and vomiting. The history is provided by the patient and a relative.  Flank Pain Associated symptoms include abdominal pain.  Emesis  Associated symptoms include abdominal pain. Pertinent negatives include no chills and no fever.    Past Medical History  Diagnosis Date  . Kidney stone   . UTI (urinary tract infection)   . Obesity     History reviewed. No pertinent past surgical history.  History reviewed. No pertinent family history.  History  Substance Use Topics  . Smoking status: Not on file  . Smokeless tobacco: Not on file  . Alcohol Use: No    OB History    Grav Para Term Preterm Abortions TAB SAB Ect Mult Living                  Review of Systems  Constitutional: Negative for fever and chills.  Gastrointestinal: Positive for nausea, vomiting and abdominal pain.  Genitourinary: Positive for flank pain. Negative for dysuria and hematuria.    Allergies  Penicillins  Home Medications   Current Outpatient Rx  Name Route Sig Dispense Refill  . ACETAMINOPHEN 500 MG PO TABS  Oral Take 1,000 mg by mouth every 6 (six) hours as needed. For pain.    . ALBUTEROL SULFATE HFA 108 (90 BASE) MCG/ACT IN AERS Inhalation Inhale 2 puffs into the lungs every 6 (six) hours as needed. Shortness of breath    . ATORVASTATIN CALCIUM 10 MG PO TABS Oral Take 10 mg by mouth daily.    Marland Kitchen LISINOPRIL-HYDROCHLOROTHIAZIDE 20-12.5 MG PO TABS Oral Take 1 tablet by mouth daily.    Marland Kitchen ONDANSETRON HCL 8 MG PO TABS Oral Take by mouth every 8 (eight) hours as needed. Nausea    . SERTRALINE HCL 50 MG PO TABS Oral Take 50 mg by mouth daily.    Marland Kitchen HYDROCODONE-ACETAMINOPHEN 5-500 MG PO TABS Oral Take 1-2 tablets by mouth every 6 (six) hours as needed for pain. 15 tablet 0  . NAPROXEN 500 MG PO TABS Oral Take 1 tablet (500 mg total) by mouth 2 (two) times daily with a meal. 30 tablet 0  . ONDANSETRON 4 MG PO TBDP Oral Take 1 tablet (4 mg total) by mouth every 8 (eight) hours as needed for nausea. 10 tablet 0  . PROMETHAZINE HCL 25 MG PO TABS Oral Take 1 tablet (25 mg total) by mouth every 6 (six) hours as needed for nausea. 12 tablet 0  . TAMSULOSIN HCL 0.4 MG PO CAPS Oral Take 1 capsule (0.4 mg total) by mouth 2 (two) times daily. 10 capsule 0    BP  144/58  Pulse 87  Temp 97.8 F (36.6 C) (Oral)  Resp 16  SpO2 95%  LMP 10/11/2012  Physical Exam  Nursing note and vitals reviewed. Constitutional: She appears well-developed and well-nourished. No distress.  HENT:  Head: Normocephalic and atraumatic.  Mouth/Throat: Oropharynx is clear and moist. No oropharyngeal exudate.  Eyes: Conjunctivae normal and EOM are normal. Pupils are equal, round, and reactive to light. Right eye exhibits no discharge. Left eye exhibits no discharge. No scleral icterus.  Neck: Normal range of motion. Neck supple. No JVD present. No thyromegaly present.  Cardiovascular: Normal rate, regular rhythm, normal heart sounds and intact distal pulses.  Exam reveals no gallop and no friction rub.   No murmur heard. Pulmonary/Chest:  Effort normal and breath sounds normal. No respiratory distress. She has no wheezes. She has no rales.  Abdominal: Soft. Bowel sounds are normal. She exhibits no distension and no mass. There is no tenderness.       There is no abdominal tenderness to palpation, no CVA tenderness, no guarding, no rebound, no peritoneal signs  Musculoskeletal: Normal range of motion. She exhibits no edema and no tenderness.  Lymphadenopathy:    She has no cervical adenopathy.  Neurological: She is alert. Coordination normal.  Skin: Skin is warm and dry. No rash noted. No erythema.  Psychiatric: She has a normal mood and affect. Her behavior is normal.    ED Course  Procedures (including critical care time)  Labs Reviewed  URINALYSIS, DIPSTICK ONLY - Abnormal; Notable for the following:    Hgb urine dipstick LARGE (*)     All other components within normal limits   Ct Abdomen Pelvis Wo Contrast  10/18/2012  *RADIOLOGY REPORT*  Clinical Data: Left flank and lower abdominal pain.  Micro hematuria.  CT ABDOMEN AND PELVIS WITHOUT CONTRAST  Technique:  Multidetector CT imaging of the abdomen and pelvis was performed following the standard protocol without intravenous contrast.  Comparison: CT abdomen and pelvis 07/20/2012.  Findings: The lung bases are clear.  No pleural or pericardial effusion.  There are no right renal or ureteral stones.  The patient has mild left hydronephrosis due to a punctate stone measuring 0.2 cm just below the pelvic brim.  Two additional small nonobstructing left renal stones are noted.  The patient is status post cholecystectomy.  The liver, spleen, adrenal glands and pancreas appear normal.  The stomach, small and large bowel and appendix are unremarkable.  Calcification in the periphery of the left ovary may be due to prior infection.  Adnexa are otherwise unremarkable.  Uterus appears normal.  No focal bony abnormality.  IMPRESSION: Mild left hydronephrosis due to a 0.2 cm stone just  below the pelvic brim.  Two additional nonobstructing left renal stones are noted.   Original Report Authenticated By: Holley Dexter, M.D.      1. Kidney stone on left side       MDM  At this time the patient appears well, has ongoing mild pain which is fluctuating and has hematuria consistent with nephrolithiasis or ureterolithiasis. Due to the rapid progression of the moving of the pain from her flank to her suprapubic area it appears that the stone is moving, she is able to tolerate the pain and she will be given pain medications. There is no signs of urinary infection, she has agreed to undergo pain control and avoid imaging and radiation at this time.   0.2cm kidney stone - pt stable for d/c.  Med have improved sx.  Discharge Prescriptions include:  Naprosyn Hydrocodone Flomax Phenergan      Vida Roller, MD 10/18/12 [phone]

## 2012-10-18 NOTE — ED Notes (Signed)
ZOX:[id number]<AV> Expected date:10/18/12<BR> Expected time:12:56 AM<BR> Means of arrival:Ambulance<BR> Comments:<BR> Left lower quadrant pain

## 2012-10-18 NOTE — ED Notes (Signed)
Per pt: left flank pain that has moved into lower left abdomen for two hours, history of kidney stones, vomited at home x5. No vomiting since EMS arrival. Pt reports 8/10 pain. #18 in R a/c.

## 2012-10-30 DIAGNOSIS — D320 Benign neoplasm of cerebral meninges: Secondary | ICD-10-CM | POA: Insufficient documentation

## 2012-12-18 DIAGNOSIS — D496 Neoplasm of unspecified behavior of brain: Secondary | ICD-10-CM | POA: Insufficient documentation

## 2012-12-18 HISTORY — DX: Neoplasm of unspecified behavior of brain: D49.6

## 2013-01-16 ENCOUNTER — Encounter (HOSPITAL_COMMUNITY): Payer: Self-pay | Admitting: Emergency Medicine

## 2013-01-16 ENCOUNTER — Emergency Department (HOSPITAL_COMMUNITY)
Admission: EM | Admit: 2013-01-16 | Discharge: 2013-01-16 | Disposition: A | Discharge status: Home / Self Care | Payer: Medicaid Other | Attending: Emergency Medicine | Admitting: Emergency Medicine

## 2013-01-16 ENCOUNTER — Emergency Department (HOSPITAL_COMMUNITY): Payer: Medicaid Other

## 2013-01-16 DIAGNOSIS — Z8661 Personal history of infections of the central nervous system: Secondary | ICD-10-CM | POA: Insufficient documentation

## 2013-01-16 DIAGNOSIS — E669 Obesity, unspecified: Secondary | ICD-10-CM | POA: Insufficient documentation

## 2013-01-16 DIAGNOSIS — Z87440 Personal history of urinary (tract) infections: Secondary | ICD-10-CM | POA: Insufficient documentation

## 2013-01-16 DIAGNOSIS — N200 Calculus of kidney: Secondary | ICD-10-CM | POA: Insufficient documentation

## 2013-01-16 DIAGNOSIS — Z79899 Other long term (current) drug therapy: Secondary | ICD-10-CM | POA: Insufficient documentation

## 2013-01-16 DIAGNOSIS — R109 Unspecified abdominal pain: Secondary | ICD-10-CM | POA: Insufficient documentation

## 2013-01-16 DIAGNOSIS — R112 Nausea with vomiting, unspecified: Secondary | ICD-10-CM | POA: Insufficient documentation

## 2013-01-16 HISTORY — DX: Neoplasm of unspecified behavior of brain: D49.6

## 2013-01-16 LAB — URINE MICROSCOPIC-ADD ON

## 2013-01-16 LAB — URINALYSIS, ROUTINE W REFLEX MICROSCOPIC
Ketones, ur: NEGATIVE mg/dL
Nitrite: NEGATIVE
Protein, ur: NEGATIVE mg/dL

## 2013-01-16 LAB — CBC WITH DIFFERENTIAL/PLATELET
Basophils Absolute: 0 10*3/uL (ref 0.0–0.1)
Basophils Relative: 0 % (ref 0–1)
Eosinophils Absolute: 0 10*3/uL (ref 0.0–0.7)
Eosinophils Relative: 0 % (ref 0–5)
Lymphocytes Relative: 12 % (ref 12–46)
MCH: 28.2 pg (ref 26.0–34.0)
MCV: 88.6 fL (ref 78.0–100.0)
Platelets: 311 10*3/uL (ref 150–400)
RDW: 14.3 % (ref 11.5–15.5)
WBC: 8.4 10*3/uL (ref 4.0–10.5)

## 2013-01-16 LAB — COMPREHENSIVE METABOLIC PANEL
Albumin: 3 g/dL — ABNORMAL LOW (ref 3.5–5.2)
Alkaline Phosphatase: 103 U/L (ref 39–117)
BUN: 12 mg/dL (ref 6–23)
Chloride: 101 mEq/L (ref 96–112)
Potassium: 4.2 mEq/L (ref 3.5–5.1)
Total Bilirubin: 0.3 mg/dL (ref 0.3–1.2)

## 2013-01-16 MED ORDER — HYDROMORPHONE HCL PF 1 MG/ML IJ SOLN
1.0000 mg | Freq: Once | INTRAMUSCULAR | Status: AC
  Administered 2013-01-16: 1 mg via INTRAVENOUS
  Filled 2013-01-16: qty 1

## 2013-01-16 MED ORDER — SODIUM CHLORIDE 0.9 % IV BOLUS (SEPSIS)
1000.0000 mL | Freq: Once | INTRAVENOUS | Status: AC
Start: 2013-01-16 — End: 2013-01-16
  Administered 2013-01-16: 1000 mL via INTRAVENOUS

## 2013-01-16 MED ORDER — ONDANSETRON HCL 4 MG/2ML IJ SOLN
4.0000 mg | Freq: Once | INTRAMUSCULAR | Status: AC
  Administered 2013-01-16: 4 mg via INTRAVENOUS
  Filled 2013-01-16: qty 2

## 2013-01-16 MED ORDER — ONDANSETRON HCL 8 MG PO TABS: 8.0000 mg | ORAL_TABLET | Freq: Three times a day (TID) | ORAL | Status: DC | PRN

## 2013-01-16 MED ORDER — HYDROCODONE-ACETAMINOPHEN 5-500 MG PO TABS: 1.0000 | ORAL_TABLET | Freq: Four times a day (QID) | ORAL | Status: DC | PRN

## 2013-01-16 NOTE — ED Notes (Signed)
Off floor for testing 

## 2013-01-16 NOTE — ED Notes (Signed)
Per EMS, history of kidney stones-last one 6 months ago on left side-dysuria, pain with urination-receives radiation for brain tumor

## 2013-01-16 NOTE — ED Notes (Signed)
Patient received 60 mg of toradol IM in route

## 2013-01-16 NOTE — ED Provider Notes (Signed)
History     CSN: [id number]  Arrival date & time 01/16/13  1308   First MD Initiated Contact with Patient 01/16/13 [phone]      Chief Complaint  Patient presents with  . Nephrolithiasis    (Consider location/radiation/quality/duration/timing/severity/associated sxs/prior treatment) HPI  The patient presents with new left flank pain.  The pain began approximately 2 hours ago, subacutely.  Since onset the pain has become severe, with radiation towards the left inguinal crease.  There is no dysuria, hematuria, though the patient notes that her urine appears odd.  There is associated nausea, vomiting, but no diarrhea.  No fever, chills, no confusion or disorientation. The patient states that the symptoms are very similar to those shakes of prior kidney stone. Notably, the patient is also receiving radiation therapy for a meningioma.  Last session was 2 days ago.   Past Medical History  Diagnosis Date  . Kidney stone   . UTI (urinary tract infection)   . Obesity     No past surgical history on file.  No family history on file.  History  Substance Use Topics  . Smoking status: Not on file  . Smokeless tobacco: Not on file  . Alcohol Use: No    OB History    Grav Para Term Preterm Abortions TAB SAB Ect Mult Living                  Review of Systems  Constitutional:       Per HPI, otherwise negative  HENT:       Per HPI, otherwise negative  Eyes: Negative.   Respiratory:       Per HPI, otherwise negative  Cardiovascular:       Per HPI, otherwise negative  Gastrointestinal: Positive for nausea, vomiting and abdominal pain. Negative for diarrhea.  Genitourinary: Positive for flank pain. Negative for dysuria, hematuria and pelvic pain.  Musculoskeletal:       Per HPI, otherwise negative  Skin: Negative.   Neurological: Negative for syncope.    Allergies  Penicillins  Home Medications   Current Outpatient Rx  Name  Route  Sig  Dispense  Refill  . ACETAMINOPHEN  500 MG PO TABS   Oral   Take 1,000 mg by mouth every 6 (six) hours as needed. For pain.         . ALBUTEROL SULFATE HFA 108 (90 BASE) MCG/ACT IN AERS   Inhalation   Inhale 2 puffs into the lungs every 6 (six) hours as needed. Shortness of breath         . ATORVASTATIN CALCIUM 10 MG PO TABS   Oral   Take 10 mg by mouth daily.         Marland Kitchen HYDROCODONE-ACETAMINOPHEN 5-500 MG PO TABS   Oral   Take 1-2 tablets by mouth every 6 (six) hours as needed for pain.   15 tablet   0   . LISINOPRIL-HYDROCHLOROTHIAZIDE 20-12.5 MG PO TABS   Oral   Take 1 tablet by mouth daily.         Marland Kitchen NAPROXEN 500 MG PO TABS   Oral   Take 1 tablet (500 mg total) by mouth 2 (two) times daily with a meal.   30 tablet   0   . ONDANSETRON 4 MG PO TBDP   Oral   Take 1 tablet (4 mg total) by mouth every 8 (eight) hours as needed for nausea.   10 tablet   0   . ONDANSETRON HCL  8 MG PO TABS   Oral   Take by mouth every 8 (eight) hours as needed. Nausea         . PROMETHAZINE HCL 25 MG PO TABS   Oral   Take 1 tablet (25 mg total) by mouth every 6 (six) hours as needed for nausea.   12 tablet   0   . SERTRALINE HCL 50 MG PO TABS   Oral   Take 50 mg by mouth daily.         Marland Kitchen TAMSULOSIN HCL 0.4 MG PO CAPS   Oral   Take 1 capsule (0.4 mg total) by mouth 2 (two) times daily.   10 capsule   0     There were no vitals taken for this visit.  Physical Exam  Nursing note and vitals reviewed. Constitutional: She is oriented to person, place, and time. She appears well-developed and well-nourished. No distress.  HENT:  Head: Normocephalic and atraumatic.  Eyes: Conjunctivae normal and EOM are normal.  Cardiovascular: Normal rate and regular rhythm.   Pulmonary/Chest: Effort normal and breath sounds normal. No stridor. No respiratory distress.  Abdominal: Soft. Normal appearance. She exhibits no distension. There is tenderness in the left upper quadrant and left lower quadrant. There is CVA  tenderness. There is no rigidity, no rebound and no guarding.  Musculoskeletal: She exhibits no edema.  Neurological: She is alert and oriented to person, place, and time. No cranial nerve deficit.  Skin: Skin is warm and dry.  Psychiatric: She has a normal mood and affect.    ED Course  Procedures (including critical care time)   Labs Reviewed  COMPREHENSIVE METABOLIC PANEL  CBC WITH DIFFERENTIAL  URINALYSIS, ROUTINE W REFLEX MICROSCOPIC  LIPASE, BLOOD   No results found.   No diagnosis found.  11:05 AM I reviewed CT results with the patient.  She remains in pain, but not in distress.  MDM  This patient with a history of kidney stones, as well as recent treatment for meningioma now presents with new left flank pain.  On exam she is in distress, hemodynamically stable.  No fever, nor any leukocytosis.  The patient does have a 3 mm stone, with mild hydronephrosis, but after distress, abnormal vital signs, she is appropriate for discharge with urology followup.     Gerhard Munch, MD 01/16/13 [phone]

## 2013-01-16 NOTE — ED Notes (Signed)
JYN:[id number]<AO> Expected date:<BR> Expected time:<BR> Means of arrival:<BR> Comments:<BR> EMS-kidney stone

## 2013-01-17 LAB — URINE CULTURE

## 2013-03-25 DIAGNOSIS — J45909 Unspecified asthma, uncomplicated: Secondary | ICD-10-CM | POA: Insufficient documentation

## 2013-03-25 DIAGNOSIS — F411 Generalized anxiety disorder: Secondary | ICD-10-CM | POA: Insufficient documentation

## 2013-03-25 DIAGNOSIS — G4733 Obstructive sleep apnea (adult) (pediatric): Secondary | ICD-10-CM | POA: Insufficient documentation

## 2013-03-25 DIAGNOSIS — Z86718 Personal history of other venous thrombosis and embolism: Secondary | ICD-10-CM | POA: Insufficient documentation

## 2013-03-25 DIAGNOSIS — H40009 Preglaucoma, unspecified, unspecified eye: Secondary | ICD-10-CM | POA: Insufficient documentation

## 2013-03-25 DIAGNOSIS — Z6841 Body Mass Index (BMI) 40.0 and over, adult: Secondary | ICD-10-CM | POA: Insufficient documentation

## 2013-03-25 DIAGNOSIS — F32A Depression, unspecified: Secondary | ICD-10-CM | POA: Insufficient documentation

## 2013-05-12 ENCOUNTER — Encounter (HOSPITAL_COMMUNITY): Payer: Self-pay | Admitting: Emergency Medicine

## 2013-05-12 ENCOUNTER — Emergency Department (HOSPITAL_COMMUNITY)
Admission: EM | Admit: 2013-05-12 | Discharge: 2013-05-13 | Disposition: A | Discharge status: Home / Self Care | Payer: Medicaid Other | Attending: Emergency Medicine | Admitting: Emergency Medicine

## 2013-05-12 DIAGNOSIS — D434 Neoplasm of uncertain behavior of spinal cord: Secondary | ICD-10-CM | POA: Insufficient documentation

## 2013-05-12 DIAGNOSIS — Z79899 Other long term (current) drug therapy: Secondary | ICD-10-CM | POA: Insufficient documentation

## 2013-05-12 DIAGNOSIS — D432 Neoplasm of uncertain behavior of brain, unspecified: Secondary | ICD-10-CM | POA: Insufficient documentation

## 2013-05-12 DIAGNOSIS — Z86718 Personal history of other venous thrombosis and embolism: Secondary | ICD-10-CM | POA: Insufficient documentation

## 2013-05-12 DIAGNOSIS — Z87442 Personal history of urinary calculi: Secondary | ICD-10-CM | POA: Insufficient documentation

## 2013-05-12 DIAGNOSIS — R109 Unspecified abdominal pain: Secondary | ICD-10-CM

## 2013-05-12 DIAGNOSIS — M79609 Pain in unspecified limb: Secondary | ICD-10-CM | POA: Insufficient documentation

## 2013-05-12 DIAGNOSIS — Z880 Allergy status to penicillin: Secondary | ICD-10-CM | POA: Insufficient documentation

## 2013-05-12 DIAGNOSIS — Z8639 Personal history of other endocrine, nutritional and metabolic disease: Secondary | ICD-10-CM | POA: Insufficient documentation

## 2013-05-12 DIAGNOSIS — Z87440 Personal history of urinary (tract) infections: Secondary | ICD-10-CM | POA: Insufficient documentation

## 2013-05-12 DIAGNOSIS — R51 Headache: Secondary | ICD-10-CM

## 2013-05-12 DIAGNOSIS — S0990XA Unspecified injury of head, initial encounter: Secondary | ICD-10-CM | POA: Insufficient documentation

## 2013-05-12 DIAGNOSIS — R1110 Vomiting, unspecified: Secondary | ICD-10-CM | POA: Insufficient documentation

## 2013-05-12 DIAGNOSIS — Z9089 Acquired absence of other organs: Secondary | ICD-10-CM | POA: Insufficient documentation

## 2013-05-12 DIAGNOSIS — Z862 Personal history of diseases of the blood and blood-forming organs and certain disorders involving the immune mechanism: Secondary | ICD-10-CM | POA: Insufficient documentation

## 2013-05-12 DIAGNOSIS — S3981XA Other specified injuries of abdomen, initial encounter: Secondary | ICD-10-CM | POA: Insufficient documentation

## 2013-05-12 DIAGNOSIS — Z86011 Personal history of benign neoplasm of the brain: Secondary | ICD-10-CM | POA: Insufficient documentation

## 2013-05-12 DIAGNOSIS — Z923 Personal history of irradiation: Secondary | ICD-10-CM | POA: Insufficient documentation

## 2013-05-12 HISTORY — DX: Reserved for inherently not codable concepts without codable children: IMO0001

## 2013-05-12 HISTORY — DX: Reserved for concepts with insufficient information to code with codable children: IMO0002

## 2013-05-12 MED ORDER — LORAZEPAM 1 MG PO TABS
2.0000 mg | ORAL_TABLET | Freq: Once | ORAL | Status: AC
  Administered 2013-05-12: 2 mg via ORAL
  Filled 2013-05-12: qty 2

## 2013-05-12 MED ORDER — HYDROCODONE-ACETAMINOPHEN 5-325 MG PO TABS
2.0000 | ORAL_TABLET | Freq: Once | ORAL | Status: AC
  Administered 2013-05-12: 2 via ORAL
  Filled 2013-05-12: qty 2
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
## 2013-05-13 MED ORDER — ONDANSETRON 8 MG PO TBDP
8.0000 mg | ORAL_TABLET | Freq: Once | ORAL | Status: AC
  Administered 2013-05-13: 8 mg via ORAL
  Filled 2013-05-13: qty 1

## 2013-05-13 MED ORDER — HYDROMORPHONE HCL PF 2 MG/ML IJ SOLN
2.0000 mg | Freq: Once | INTRAMUSCULAR | Status: AC
  Administered 2013-05-13: 2 mg via INTRAMUSCULAR
  Filled 2013-05-13: qty 1

## 2013-05-13 MED ORDER — DIPHENHYDRAMINE HCL 25 MG PO CAPS
25.0000 mg | ORAL_CAPSULE | Freq: Once | ORAL | Status: AC
  Administered 2013-05-13: 25 mg via ORAL
  Filled 2013-05-13: qty 1

## 2013-08-19 ENCOUNTER — Emergency Department (HOSPITAL_BASED_OUTPATIENT_CLINIC_OR_DEPARTMENT_OTHER)
Admission: EM | Admit: 2013-08-19 | Discharge: 2013-08-19 | Disposition: A | Discharge status: Home / Self Care | Payer: Medicaid Other | Attending: Emergency Medicine | Admitting: Emergency Medicine

## 2013-08-19 ENCOUNTER — Encounter (HOSPITAL_BASED_OUTPATIENT_CLINIC_OR_DEPARTMENT_OTHER): Payer: Self-pay | Admitting: Emergency Medicine

## 2013-08-19 DIAGNOSIS — I10 Essential (primary) hypertension: Secondary | ICD-10-CM | POA: Insufficient documentation

## 2013-08-19 DIAGNOSIS — Z86011 Personal history of benign neoplasm of the brain: Secondary | ICD-10-CM | POA: Insufficient documentation

## 2013-08-19 DIAGNOSIS — Z79899 Other long term (current) drug therapy: Secondary | ICD-10-CM | POA: Insufficient documentation

## 2013-08-19 DIAGNOSIS — Z87442 Personal history of urinary calculi: Secondary | ICD-10-CM | POA: Insufficient documentation

## 2013-08-19 DIAGNOSIS — E669 Obesity, unspecified: Secondary | ICD-10-CM | POA: Insufficient documentation

## 2013-08-19 DIAGNOSIS — Z880 Allergy status to penicillin: Secondary | ICD-10-CM | POA: Insufficient documentation

## 2013-08-19 DIAGNOSIS — Z91040 Latex allergy status: Secondary | ICD-10-CM | POA: Insufficient documentation

## 2013-08-19 DIAGNOSIS — N390 Urinary tract infection, site not specified: Secondary | ICD-10-CM | POA: Insufficient documentation

## 2013-08-19 HISTORY — DX: Essential (primary) hypertension: I10

## 2013-08-19 LAB — URINALYSIS, ROUTINE W REFLEX MICROSCOPIC
Bilirubin Urine: NEGATIVE
Leukocytes, UA: NEGATIVE
Nitrite: NEGATIVE
Specific Gravity, Urine: 1.024 (ref 1.005–1.030)
pH: 6.5 (ref 5.0–8.0)

## 2013-08-19 MED ORDER — NITROFURANTOIN MONOHYD MACRO 100 MG PO CAPS: 100.0000 mg | ORAL_CAPSULE | Freq: Two times a day (BID) | ORAL | Status: DC

## 2013-08-19 MED ORDER — NITROFURANTOIN MONOHYD MACRO 100 MG PO CAPS
100.0000 mg | ORAL_CAPSULE | Freq: Once | ORAL | Status: AC
  Administered 2013-08-19: 100 mg via ORAL
  Filled 2013-08-19: qty 1

## 2013-08-19 NOTE — ED Provider Notes (Signed)
CSN: [id number]     Arrival date & time 08/19/13  2215 History   First MD Initiated Contact with Patient 08/19/13 2233     Chief Complaint  Patient presents with  . Urinary Frequency   (Consider location/radiation/quality/duration/timing/severity/associated sxs/prior Treatment) The history is provided by the patient.  Garrett Bowring is a 42 y.o. female history of recurrent UTI, hypertension, kidney stone here presenting with urinary frequency. Urinary frequency for the last 5 days. She also complains of some pain in her bladder. Denies any fevers or chills or vomiting. She has recurrent urinary tract infections that she says usually resolves with Macrobid.    Past Medical History  Diagnosis Date  . Kidney stone   . UTI (urinary tract infection)   . Obesity   . Brain tumor   . Radiation     Brain tumor  . Hypertension    Past Surgical History  Procedure Laterality Date  . Cholecystectomy    . Kidney stone removal    . Tubal ligation     History reviewed. No pertinent family history. History  Substance Use Topics  . Smoking status: Never Smoker   . Smokeless tobacco: Not on file  . Alcohol Use: No   OB History   Grav Para Term Preterm Abortions TAB SAB Ect Mult Living                 Review of Systems  Genitourinary: Positive for frequency.    Allergies  Latex and Penicillins  Home Medications   Current Outpatient Rx  Name  Route  Sig  Dispense  Refill  . lisinopril (PRINIVIL,ZESTRIL) 10 MG tablet   Oral   Take 10 mg by mouth daily.         Marland Kitchen LORazepam (ATIVAN) 1 MG tablet   Oral   Take 1 mg by mouth every 6 (six) hours as needed for anxiety (anxiety).         Marland Kitchen albuterol (PROVENTIL HFA;VENTOLIN HFA) 108 (90 BASE) MCG/ACT inhaler   Inhalation   Inhale 2 puffs into the lungs every 6 (six) hours as needed. Shortness of breath         . HYDROcodone-acetaminophen (NORCO) 10-325 MG per tablet   Oral   Take 1 tablet by mouth every 6 (six) hours as  needed for pain (pain).         . naproxen sodium (ANAPROX) 220 MG tablet   Oral   Take 220 mg by mouth 2 (two) times daily with a meal. pain         . sertraline (ZOLOFT) 50 MG tablet   Oral   Take 50 mg by mouth daily.          BP 174/93  Pulse 88  Temp(Src) 98.5 F (36.9 C) (Oral)  Resp 18  SpO2 100%  LMP 08/01/2013 Physical Exam  Nursing note and vitals reviewed. Constitutional: She is oriented to person, place, and time. She appears well-developed and well-nourished.  Obese   HENT:  Head: Normocephalic.  Mouth/Throat: Oropharynx is clear and moist.  Eyes: Pupils are equal, round, and reactive to light.  Neck: Normal range of motion. Neck supple.  Cardiovascular: Normal rate, regular rhythm and normal heart sounds.   Pulmonary/Chest: Effort normal and breath sounds normal. No respiratory distress. She has no wheezes. She has no rales.  Abdominal:  Obese, soft. Mild suprapubic tenderness. No CVAT   Musculoskeletal: Normal range of motion.  Neurological: She is alert and oriented to person, place, and  time.  Skin: Skin is warm and dry.  Psychiatric: She has a normal mood and affect. Her behavior is normal. Judgment and thought content normal.    ED Course  Procedures (including critical care time) Labs Review Labs Reviewed  URINALYSIS, ROUTINE W REFLEX MICROSCOPIC - Abnormal; Notable for the following:    APPearance CLOUDY (*)    All other components within normal limits  URINE CULTURE   Imaging Review No results found.  MDM  No diagnosis found. Faren Florence is a 42 y.o. female here with urinary frequency, suprapubic tenderness. Likely UTI. Urine was cloudy but no obvious infection. Will get urine culture. Will treat empirically with macrobid.     Richardean Canal, MD 08/19/13 [phone]

## 2013-08-19 NOTE — ED Notes (Signed)
Pt reports that she gets frequent uti's and for 1 week has had increased urination and pain when urinating

## 2013-08-21 LAB — URINE CULTURE
Colony Count: NO GROWTH
Culture: NO GROWTH

## 2013-12-13 ENCOUNTER — Encounter (HOSPITAL_COMMUNITY): Payer: Self-pay | Admitting: Emergency Medicine

## 2013-12-13 ENCOUNTER — Emergency Department (HOSPITAL_COMMUNITY): Payer: Medicaid Other

## 2013-12-13 ENCOUNTER — Emergency Department (HOSPITAL_COMMUNITY)
Admission: EM | Admit: 2013-12-13 | Discharge: 2013-12-13 | Disposition: A | Discharge status: Home / Self Care | Payer: Medicaid Other | Attending: Emergency Medicine | Admitting: Emergency Medicine

## 2013-12-13 DIAGNOSIS — Z791 Long term (current) use of non-steroidal anti-inflammatories (NSAID): Secondary | ICD-10-CM | POA: Insufficient documentation

## 2013-12-13 DIAGNOSIS — R42 Dizziness and giddiness: Secondary | ICD-10-CM | POA: Insufficient documentation

## 2013-12-13 DIAGNOSIS — Z79899 Other long term (current) drug therapy: Secondary | ICD-10-CM | POA: Insufficient documentation

## 2013-12-13 DIAGNOSIS — Z8669 Personal history of other diseases of the nervous system and sense organs: Secondary | ICD-10-CM | POA: Insufficient documentation

## 2013-12-13 DIAGNOSIS — Z91040 Latex allergy status: Secondary | ICD-10-CM | POA: Insufficient documentation

## 2013-12-13 DIAGNOSIS — R209 Unspecified disturbances of skin sensation: Secondary | ICD-10-CM | POA: Insufficient documentation

## 2013-12-13 DIAGNOSIS — Z87442 Personal history of urinary calculi: Secondary | ICD-10-CM | POA: Insufficient documentation

## 2013-12-13 DIAGNOSIS — Z880 Allergy status to penicillin: Secondary | ICD-10-CM | POA: Insufficient documentation

## 2013-12-13 DIAGNOSIS — I10 Essential (primary) hypertension: Secondary | ICD-10-CM | POA: Insufficient documentation

## 2013-12-13 DIAGNOSIS — R519 Headache, unspecified: Secondary | ICD-10-CM

## 2013-12-13 DIAGNOSIS — E669 Obesity, unspecified: Secondary | ICD-10-CM | POA: Insufficient documentation

## 2013-12-13 DIAGNOSIS — Z923 Personal history of irradiation: Secondary | ICD-10-CM | POA: Insufficient documentation

## 2013-12-13 DIAGNOSIS — R0789 Other chest pain: Secondary | ICD-10-CM

## 2013-12-13 DIAGNOSIS — R110 Nausea: Secondary | ICD-10-CM | POA: Insufficient documentation

## 2013-12-13 DIAGNOSIS — R51 Headache: Secondary | ICD-10-CM | POA: Insufficient documentation

## 2013-12-13 DIAGNOSIS — Z87440 Personal history of urinary (tract) infections: Secondary | ICD-10-CM | POA: Insufficient documentation

## 2013-12-13 LAB — CBC
HCT: 40.4 % (ref 36.0–46.0)
Hemoglobin: 13.1 g/dL (ref 12.0–15.0)
MCH: 28.5 pg (ref 26.0–34.0)
MCV: 88 fL (ref 78.0–100.0)
RBC: 4.59 MIL/uL (ref 3.87–5.11)

## 2013-12-13 LAB — TROPONIN I: Troponin I: <0.3 ng/mL (ref <0.30)

## 2013-12-13 LAB — POCT I-STAT, CHEM 8
BUN: 12 mg/dL (ref 6–23)
Calcium, Ion: 1.49 mmol/L — ABNORMAL HIGH (ref 1.12–1.23)
Chloride: 102 mEq/L (ref 96–112)
Glucose, Bld: 90 mg/dL (ref 70–99)
Potassium: 3.9 mEq/L (ref 3.5–5.1)

## 2013-12-13 MED ORDER — KETOROLAC TROMETHAMINE 30 MG/ML IJ SOLN
30.0000 mg | Freq: Once | INTRAMUSCULAR | Status: AC
  Administered 2013-12-13: 30 mg via INTRAVENOUS
  Filled 2013-12-13: qty 1

## 2013-12-13 MED ORDER — HYDROCODONE-ACETAMINOPHEN 5-325 MG PO TABS: 1.0000 | ORAL_TABLET | Freq: Four times a day (QID) | ORAL | Status: DC | PRN

## 2013-12-13 MED ORDER — PROCHLORPERAZINE EDISYLATE 5 MG/ML IJ SOLN
10.0000 mg | Freq: Once | INTRAMUSCULAR | Status: AC
  Administered 2013-12-13: 10 mg via INTRAVENOUS
  Filled 2013-12-13: qty 2

## 2013-12-13 MED ORDER — SODIUM CHLORIDE 0.9 % IV BOLUS (SEPSIS)
1000.0000 mL | Freq: Once | INTRAVENOUS | Status: AC
  Administered 2013-12-13: 1000 mL via INTRAVENOUS

## 2013-12-13 MED ORDER — IBUPROFEN 800 MG PO TABS: 800.0000 mg | ORAL_TABLET | Freq: Three times a day (TID) | ORAL | Status: DC | PRN

## 2013-12-13 NOTE — ED Notes (Signed)
Pt reports CP for the past week with [facility], pt states that she has not had a cough but that her ribs also hurt when she breathes. Pt states that she has been nauseated and dizzy as well. Pt a&o x4, NAD noted at this time.

## 2013-12-13 NOTE — ED Provider Notes (Signed)
CSN: [id number]     Arrival date & time 12/13/13  0104 History   First MD Initiated Contact with Patient 12/13/13 0601     Chief Complaint  Patient presents with  . Chest Pain   (Consider location/radiation/quality/duration/timing/severity/associated sxs/prior Treatment) HPI Patient presents to the emergency department with headache, chest discomfort and bilateral arm tingling.  For the last 3 days.  Patient, states she's had some mild nausea.  The patient, states, that she has been somewhat dizzy, as well.  Patient denies blurred vision, diarrhea, vomiting, back pain, dysuria, weakness, numbness, shortness of breath, coughing up blood, fever, or syncope.  The patient, states, that she's also had some mild neck discomfort bilaterally.  Patient, states, that she googled all of her symptoms, and that's what brought her to the emergency department.  Patient, states the light does bother her headache. Past Medical History  Diagnosis Date  . Kidney stone   . UTI (urinary tract infection)   . Obesity   . Brain tumor   . Radiation     Brain tumor  . Hypertension    Past Surgical History  Procedure Laterality Date  . Cholecystectomy    . Kidney stone removal    . Tubal ligation     History reviewed. No pertinent family history. History  Substance Use Topics  . Smoking status: Never Smoker   . Smokeless tobacco: Not on file  . Alcohol Use: No   OB History   Grav Para Term Preterm Abortions TAB SAB Ect Mult Living                 Review of Systems All other systems negative except as documented in the HPI. All pertinent positives and negatives as reviewed in the HPI. Allergies  Penicillins and Latex  Home Medications   Current Outpatient Rx  Name  Route  Sig  Dispense  Refill  . albuterol (PROVENTIL HFA;VENTOLIN HFA) 108 (90 BASE) MCG/ACT inhaler   Inhalation   Inhale 2 puffs into the lungs every 6 (six) hours as needed. Shortness of breath         .  HYDROcodone-acetaminophen (NORCO) 10-325 MG per tablet   Oral   Take 1 tablet by mouth every 6 (six) hours as needed for pain (pain).         Marland Kitchen lisinopril (PRINIVIL,ZESTRIL) 10 MG tablet   Oral   Take 10 mg by mouth daily.         Marland Kitchen LORazepam (ATIVAN) 1 MG tablet   Oral   Take 1 mg by mouth every 6 (six) hours as needed for anxiety (anxiety).         . naproxen sodium (ANAPROX) 220 MG tablet   Oral   Take 220 mg by mouth 2 (two) times daily with a meal. pain          BP 109/64  Pulse 79  Temp(Src) 98 F (36.7 C) (Oral)  Resp 16  SpO2 97%  LMP 12/01/2013 Physical Exam  Nursing note and vitals reviewed. Constitutional: She is oriented to person, place, and time. She appears well-developed and well-nourished. No distress.  HENT:  Head: Normocephalic and atraumatic.  Mouth/Throat: Oropharynx is clear and moist.  Eyes: Pupils are equal, round, and reactive to light.  Neck: Normal range of motion. Neck supple.  Cardiovascular: Normal rate, regular rhythm and normal heart sounds.  Exam reveals no gallop and no friction rub.   No murmur heard. Pulmonary/Chest: Effort normal and breath sounds normal. No respiratory  distress.  Neurological: She is alert and oriented to person, place, and time. Coordination normal.  Skin: Skin is warm and dry. No rash noted. No erythema.    ED Course  Procedures (including critical care time) Labs Review Labs Reviewed  GLUCOSE, CAPILLARY - Abnormal; Notable for the following:    Glucose-Capillary 110 (*)    All other components within normal limits  POCT I-STAT, CHEM 8 - Abnormal; Notable for the following:    Calcium, Ion 1.49 (*)    All other components within normal limits  CBC  TROPONIN I  TROPONIN I   Imaging Review Dg Chest 2 View  12/13/2013   CLINICAL DATA:  Chest pain, shortness of breath, nausea and dizziness. Hand tingling.  EXAM: CHEST  2 VIEW  COMPARISON:  Chest radiograph performed 10/14/2010, and CTA of the chest  performed 10/15/2010  FINDINGS: The lungs are well-aerated and clear. There is no evidence of focal opacification, pleural effusion or pneumothorax. Minimal bibasilar densities are thought to reflect overlying soft tissues.  The heart is normal in size; the mediastinal contour is within normal limits. No acute osseous abnormalities are seen. Clips are noted within the right upper quadrant, reflecting prior cholecystectomy.  IMPRESSION: No acute cardiopulmonary process seen.   Electronically Signed   By: Roanna Raider M.D.   On: 12/13/2013 02:33   Ct Head Wo Contrast  12/13/2013   CLINICAL DATA:  Headache, reported sphenoid meningioma.  EXAM: CT HEAD WITHOUT CONTRAST  TECHNIQUE: Contiguous axial images were obtained from the base of the skull through the vertex without intravenous contrast.  COMPARISON:  CT of the head October 02, 2012  FINDINGS: The ventricles and sulci are normal. No intraparenchymal hemorrhage, mass effect nor midline shift. No acute large vascular territory infarcts.  No abnormal extra-axial fluid collections. Basal cisterns are patent.  No skull fracture. Hyperostosis frontalis internus. In addition, platelike calcification along the left sphenoid wing which is thickened/hyperostotic, in addition to thickened appearance of the left anterior clinoid, and sphenoid bone. Visualized paranasal sinuses and mastoid air-cells are well-aerated. The included ocular globes and orbital contents are non-suspicious.  IMPRESSION: No acute intracranial process. 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   Electronically Signed   By: Awilda Metro   On: 12/13/2013 06:46    EKG Interpretation    Date/Time:  Saturday December 13 2013 01:17:52 EST Ventricular Rate:  82 PR Interval:  176 QRS Duration: 80 QT Interval:  359 QTC Calculation: 419 R Axis:   51 Text Interpretation:  Sinus rhythm Low voltage, precordial leads Nonspecific ST  abnormality Abnormal ECG Confirmed by OPITZ  MD, BRIAN ([id number]) on 12/13/2013 1:27:15 AM           Patient is PERC negative.  She has had a history of brain tumor with radiation. we got a CT scan to look for any further history from the.  The nursing note mentioned, shortness of breath.  Patient, states, that she's not really been short of breath.  Patient has no cardiac risk factors.  She does have a history of anxiety and with the, bilateral arm tingling.  This could be a possibility for the cause of her chest discomfort was discomfort has also been constant for the past 3 days.  Carlyle Dolly, PA-C 12/13/13 [phone]

## 2013-12-13 NOTE — ED Provider Notes (Signed)
Medical screening examination/treatment/procedure(s) were performed by non-physician practitioner and as supervising physician I was immediately available for consultation/collaboration.  EKG Interpretation    Date/Time:  Saturday December 13 2013 01:17:52 EST Ventricular Rate:  82 PR Interval:  176 QRS Duration: 80 QT Interval:  359 QTC Calculation: 419 R Axis:   51 Text Interpretation:  Sinus rhythm Low voltage, precordial leads Nonspecific ST abnormality Abnormal ECG Confirmed by OPITZ  MD, BRIAN [phone]) on 12/13/2013 1:27:15 AM             Shon Baton, MD 12/13/13 [id number]

## 2013-12-21 ENCOUNTER — Encounter (HOSPITAL_COMMUNITY): Payer: Self-pay | Admitting: Emergency Medicine

## 2013-12-21 ENCOUNTER — Emergency Department (HOSPITAL_COMMUNITY)
Admission: EM | Admit: 2013-12-21 | Discharge: 2013-12-22 | Disposition: A | Discharge status: Home / Self Care | Payer: Medicaid Other | Attending: Emergency Medicine | Admitting: Emergency Medicine

## 2013-12-21 ENCOUNTER — Emergency Department (HOSPITAL_COMMUNITY): Payer: Medicaid Other

## 2013-12-21 DIAGNOSIS — Z792 Long term (current) use of antibiotics: Secondary | ICD-10-CM | POA: Insufficient documentation

## 2013-12-21 DIAGNOSIS — M79609 Pain in unspecified limb: Secondary | ICD-10-CM | POA: Insufficient documentation

## 2013-12-21 DIAGNOSIS — M7918 Myalgia, other site: Secondary | ICD-10-CM

## 2013-12-21 DIAGNOSIS — Z79899 Other long term (current) drug therapy: Secondary | ICD-10-CM | POA: Insufficient documentation

## 2013-12-21 DIAGNOSIS — Z91040 Latex allergy status: Secondary | ICD-10-CM | POA: Insufficient documentation

## 2013-12-21 DIAGNOSIS — I10 Essential (primary) hypertension: Secondary | ICD-10-CM | POA: Insufficient documentation

## 2013-12-21 DIAGNOSIS — Z86011 Personal history of benign neoplasm of the brain: Secondary | ICD-10-CM | POA: Insufficient documentation

## 2013-12-21 DIAGNOSIS — Z880 Allergy status to penicillin: Secondary | ICD-10-CM | POA: Insufficient documentation

## 2013-12-21 DIAGNOSIS — Z791 Long term (current) use of non-steroidal anti-inflammatories (NSAID): Secondary | ICD-10-CM | POA: Insufficient documentation

## 2013-12-21 DIAGNOSIS — E669 Obesity, unspecified: Secondary | ICD-10-CM | POA: Insufficient documentation

## 2013-12-21 DIAGNOSIS — Z87440 Personal history of urinary (tract) infections: Secondary | ICD-10-CM | POA: Insufficient documentation

## 2013-12-21 DIAGNOSIS — Z87442 Personal history of urinary calculi: Secondary | ICD-10-CM | POA: Insufficient documentation

## 2013-12-21 DIAGNOSIS — M542 Cervicalgia: Secondary | ICD-10-CM | POA: Insufficient documentation

## 2013-12-21 MED ORDER — KETOROLAC TROMETHAMINE 30 MG/ML IJ SOLN: 30.0000 mg | Freq: Once | INTRAMUSCULAR | Status: DC

## 2013-12-21 MED ORDER — KETOROLAC TROMETHAMINE 60 MG/2ML IM SOLN
60.0000 mg | Freq: Once | INTRAMUSCULAR | Status: AC
  Administered 2013-12-21: 60 mg via INTRAMUSCULAR
  Filled 2013-12-21: qty 2

## 2013-12-21 MED ORDER — HYDROCODONE-ACETAMINOPHEN 5-325 MG PO TABS
1.0000 | ORAL_TABLET | Freq: Once | ORAL | Status: AC
  Administered 2013-12-21: 1 via ORAL
  Filled 2013-12-21: qty 1

## 2013-12-21 NOTE — ED Notes (Signed)
Patient reports that for the last 2 -3 days she has had pain starting in her right neck moving down into her right arm. She has a history of blood clots to her lung post op.

## 2013-12-22 LAB — CBC WITH DIFFERENTIAL/PLATELET
BASOS ABS: 0 10*3/uL (ref 0.0–0.1)
Basophils Relative: 1 % (ref 0–1)
EOS PCT: 3 % (ref 0–5)
Eosinophils Absolute: 0.2 10*3/uL (ref 0.0–0.7)
HCT: 39.2 % (ref 36.0–46.0)
Hemoglobin: 12.5 g/dL (ref 12.0–15.0)
LYMPHS PCT: 30 % (ref 12–46)
Lymphs Abs: 1.9 10*3/uL (ref 0.7–4.0)
MCH: 28 pg (ref 26.0–34.0)
MCHC: 31.9 g/dL (ref 30.0–36.0)
MCV: 87.9 fL (ref 78.0–100.0)
Monocytes Absolute: 0.9 10*3/uL (ref 0.1–1.0)
Monocytes Relative: 14 % — ABNORMAL HIGH (ref 3–12)
NEUTROS PCT: 53 % (ref 43–77)
Neutro Abs: 3.4 10*3/uL (ref 1.7–7.7)
PLATELETS: 286 10*3/uL (ref 150–400)
RBC: 4.46 MIL/uL (ref 3.87–5.11)
RDW: 13.9 % (ref 11.5–15.5)
WBC: 6.5 10*3/uL (ref 4.0–10.5)

## 2013-12-22 LAB — BASIC METABOLIC PANEL
BUN: 8 mg/dL (ref 6–23)
CALCIUM: 9.8 mg/dL (ref 8.4–10.5)
CO2: 29 meq/L (ref 19–32)
Chloride: 105 mEq/L (ref 96–112)
Creatinine, Ser: 0.68 mg/dL (ref 0.50–1.10)
GFR calc Af Amer: >90 mL/min (ref >90)
Glucose, Bld: 102 mg/dL — ABNORMAL HIGH (ref 70–99)
POTASSIUM: 4.4 meq/L (ref 3.7–5.3)
SODIUM: 141 meq/L (ref 137–147)

## 2013-12-22 LAB — D-DIMER, QUANTITATIVE (NOT AT ARMC): D DIMER QUANT: 0.29 ug{FEU}/mL (ref 0.00–0.48)

## 2013-12-22 MED ORDER — HYDROCODONE-ACETAMINOPHEN 5-325 MG PO TABS: 1.0000 | ORAL_TABLET | Freq: Four times a day (QID) | ORAL | Status: DC | PRN

## 2013-12-22 NOTE — ED Provider Notes (Signed)
CSN: [id number]     Arrival date & time 12/21/13  2302 History   First MD Initiated Contact with Patient 12/21/13 2324     Chief Complaint  Patient presents with  . Arm Pain   (Consider location/radiation/quality/duration/timing/severity/associated sxs/prior Treatment) HPI  This a 43 year old female with history of benign brain tumor, hypertension who presents with right arm and neck pain. Patient reports she had onset of symptoms 2-3 days ago. Patient reports she had a headache and was seen here on December 27. Following that she developed right neck pain and arm pain. She reports decreased range of motion. She denies any injury. Currently her pain is out of 10. She is taking ibuprofen at home without any relief. Patient also reports she noted a swelling in her right neck and right arm. She denies any fevers, sore throat, or respiratory symptoms. She does have a history of blood clots following surgery.  Past Medical History  Diagnosis Date  . Kidney stone   . UTI (urinary tract infection)   . Obesity   . Brain tumor   . Radiation     Brain tumor  . Hypertension    Past Surgical History  Procedure Laterality Date  . Cholecystectomy    . Kidney stone removal    . Tubal ligation     History reviewed. No pertinent family history. History  Substance Use Topics  . Smoking status: Never Smoker   . Smokeless tobacco: Not on file  . Alcohol Use: No   OB History   Grav Para Term Preterm Abortions TAB SAB Ect Mult Living                 Review of Systems  Constitutional: Negative for fever.  Respiratory: Negative for cough, chest tightness and shortness of breath.   Cardiovascular: Negative for chest pain.  Gastrointestinal: Negative for nausea, vomiting and abdominal pain.  Genitourinary: Negative for dysuria.  Musculoskeletal: Positive for neck pain. Negative for back pain.       Right arm pain  Skin: Negative for wound.  Neurological: Negative for headaches.   Psychiatric/Behavioral: Negative for confusion.  All other systems reviewed and are negative.    Allergies  Penicillins and Latex  Home Medications   Current Outpatient Rx  Name  Route  Sig  Dispense  Refill  . albuterol (PROVENTIL HFA;VENTOLIN HFA) 108 (90 BASE) MCG/ACT inhaler   Inhalation   Inhale 2 puffs into the lungs every 6 (six) hours as needed. Shortness of breath         . clindamycin (CLEOCIN) 300 MG capsule   Oral   Take 300 mg by mouth 3 (three) times daily. Leftover antibiotics from tooth infection.         Marland Kitchen HYDROcodone-acetaminophen (NORCO) 10-325 MG per tablet   Oral   Take 1 tablet by mouth every 6 (six) hours as needed for pain (pain).         Marland Kitchen ibuprofen (ADVIL,MOTRIN) 800 MG tablet   Oral   Take 1 tablet (800 mg total) by mouth every 8 (eight) hours as needed.   21 tablet   0   . lisinopril (PRINIVIL,ZESTRIL) 10 MG tablet   Oral   Take 10 mg by mouth daily.         Marland Kitchen LORazepam (ATIVAN) 1 MG tablet   Oral   Take 1 mg by mouth every 6 (six) hours as needed for anxiety (anxiety).         . naproxen sodium (ANAPROX)  220 MG tablet   Oral   Take 220 mg by mouth 2 (two) times daily with a meal. pain         . HYDROcodone-acetaminophen (NORCO/VICODIN) 5-325 MG per tablet   Oral   Take 1 tablet by mouth every 6 (six) hours as needed for moderate pain.   10 tablet   0    BP 122/79  Pulse 82  Temp(Src) 97.8 F (36.6 C) (Oral)  Resp 16  Wt 324 lb (146.965 kg)  SpO2 99%  LMP 12/01/2013 Physical Exam  Nursing note and vitals reviewed. Constitutional: She is oriented to person, place, and time. She appears well-developed and well-nourished.  Obese  HENT:  Head: Normocephalic and atraumatic.  Eyes: Pupils are equal, round, and reactive to light.  Neck: Normal range of motion. Neck supple.  Cardiovascular: Normal rate, regular rhythm and normal heart sounds.   No murmur heard. Pulmonary/Chest: Effort normal and breath sounds normal.  No respiratory distress. She has no wheezes.  Abdominal: Soft. There is no tenderness.  Musculoskeletal:  Tenderness to palpation over the medial aspect of the right upper extremity.  Small mobile not noted over the medial aspect of the biceps muscle  Lymphadenopathy:    She has no cervical adenopathy.  Neurological: She is alert and oriented to person, place, and time.  Skin: Skin is warm and dry.  Psychiatric: She has a normal mood and affect.    ED Course  Procedures (including critical care time) Labs Review Labs Reviewed  CBC WITH DIFFERENTIAL - Abnormal; Notable for the following:    Monocytes Relative 14 (*)    All other components within normal limits  BASIC METABOLIC PANEL - Abnormal; Notable for the following:    Glucose, Bld 102 (*)    All other components within normal limits  D-DIMER, QUANTITATIVE   Imaging Review Dg Humerus Right  12/22/2013   CLINICAL DATA:  Pain midshaft of humerus.  No known injury.  EXAM: RIGHT HUMERUS - 2+ VIEW  COMPARISON:  None.  FINDINGS: There is no evidence of fracture or other focal bone lesions. Soft tissues are unremarkable.  IMPRESSION: Negative.   Electronically Signed   By: Curlene Dolphin M.D.   On: 12/22/2013 00:58    EKG Interpretation   None       MDM   1. Musculoskeletal pain    Patient presents with right arm and neck pain. She is nontoxic-appearing on exam. Patient has tenderness to palpation over the medial aspect of the proximal humerus which appears to be muscular. There is no evidence of significant swelling. Basic labwork was obtained including a d-dimer which was negative and I have low suspicion for DVT in that extremity.  X-rays negative for fracture. I encouraged the patient to take ibuprofen at home for pain. I will give her short course of Norco for refractory pain.  After history, exam, and medical workup I feel the patient has been appropriately medically screened and is safe for discharge home. Pertinent  diagnoses were discussed with the patient. Patient was given return precautions.    Merryl Hacker, MD 12/22/13 [phone]

## 2013-12-22 NOTE — Discharge Instructions (Signed)
Musculoskeletal Pain °Musculoskeletal pain is muscle and boney aches and pains. These pains can occur in any part of the body. Your caregiver may treat you without knowing the cause of the pain. They may treat you if blood or urine tests, X-rays, and other tests were normal.  °CAUSES °There is often not a definite cause or reason for these pains. These pains may be caused by a type of germ (virus). The discomfort may also come from overuse. Overuse includes working out too hard when your body is not fit. Boney aches also come from weather changes. Bone is sensitive to atmospheric pressure changes. °HOME CARE INSTRUCTIONS  °· Ask when your test results will be ready. Make sure you get your test results. °· Only take over-the-counter or prescription medicines for pain, discomfort, or fever as directed by your caregiver. If you were given medications for your condition, do not drive, operate machinery or power tools, or sign legal documents for 24 hours. Do not drink alcohol. Do not take sleeping pills or other medications that may interfere with treatment. °· Continue all activities unless the activities cause more pain. When the pain lessens, slowly resume normal activities. Gradually increase the intensity and duration of the activities or exercise. °· During periods of severe pain, bed rest may be helpful. Lay or sit in any position that is comfortable. °· Putting ice on the injured area. °· Put ice in a bag. °· Place a towel between your skin and the bag. °· Leave the ice on for 15 to 20 minutes, 3 to 4 times a day. °· Follow up with your caregiver for continued problems and no reason can be found for the pain. If the pain becomes worse or does not go away, it may be necessary to repeat tests or do additional testing. Your caregiver may need to look further for a possible cause. °SEEK IMMEDIATE MEDICAL CARE IF: °· You have pain that is getting worse and is not relieved by medications. °· You develop chest pain  that is associated with shortness or breath, sweating, feeling sick to your stomach (nauseous), or throw up (vomit). °· Your pain becomes localized to the abdomen. °· You develop any new symptoms that seem different or that concern you. °MAKE SURE YOU:  °· Understand these instructions. °· Will watch your condition. °· Will get help right away if you are not doing well or get worse. °Document Released: 12/04/2005 Document Revised: 02/26/2012 Document Reviewed: 07/24/2008 °[employer]® Patient Information ©2014 [employer]. ° °

## 2014-01-12 ENCOUNTER — Encounter (HOSPITAL_COMMUNITY)
Admission: EM | Disposition: A | Discharge status: Home / Self Care | Payer: Self-pay | Source: Home / Self Care | Attending: Emergency Medicine

## 2014-01-12 ENCOUNTER — Encounter (HOSPITAL_COMMUNITY): Payer: Medicaid Other | Admitting: Anesthesiology

## 2014-01-12 ENCOUNTER — Encounter (HOSPITAL_COMMUNITY): Payer: Self-pay | Admitting: Emergency Medicine

## 2014-01-12 ENCOUNTER — Emergency Department (HOSPITAL_COMMUNITY): Payer: Medicaid Other | Admitting: Anesthesiology

## 2014-01-12 ENCOUNTER — Ambulatory Visit (HOSPITAL_COMMUNITY)
Admission: EM | Admit: 2014-01-12 | Discharge: 2014-01-13 | Disposition: A | Discharge status: Home / Self Care | Payer: Medicaid Other | Attending: Emergency Medicine | Admitting: Emergency Medicine

## 2014-01-12 ENCOUNTER — Emergency Department (HOSPITAL_COMMUNITY): Payer: Medicaid Other

## 2014-01-12 DIAGNOSIS — I10 Essential (primary) hypertension: Secondary | ICD-10-CM | POA: Insufficient documentation

## 2014-01-12 DIAGNOSIS — N200 Calculus of kidney: Secondary | ICD-10-CM | POA: Diagnosis present

## 2014-01-12 DIAGNOSIS — R1012 Left upper quadrant pain: Secondary | ICD-10-CM | POA: Insufficient documentation

## 2014-01-12 DIAGNOSIS — Z86711 Personal history of pulmonary embolism: Secondary | ICD-10-CM | POA: Insufficient documentation

## 2014-01-12 DIAGNOSIS — Z79899 Other long term (current) drug therapy: Secondary | ICD-10-CM | POA: Insufficient documentation

## 2014-01-12 HISTORY — DX: Other pulmonary embolism without acute cor pulmonale: I26.99

## 2014-01-12 HISTORY — PX: CYSTOSCOPY WITH STENT PLACEMENT: SHX5790

## 2014-01-12 LAB — URINE MICROSCOPIC-ADD ON

## 2014-01-12 LAB — URINALYSIS, ROUTINE W REFLEX MICROSCOPIC
BILIRUBIN URINE: NEGATIVE
GLUCOSE, UA: NEGATIVE mg/dL
Ketones, ur: NEGATIVE mg/dL
Nitrite: NEGATIVE
PH: 6 (ref 5.0–8.0)
Protein, ur: NEGATIVE mg/dL
Specific Gravity, Urine: 1.011 (ref 1.005–1.030)
Urobilinogen, UA: 1 mg/dL (ref 0.0–1.0)

## 2014-01-12 LAB — COMPREHENSIVE METABOLIC PANEL
ALBUMIN: 3.5 g/dL (ref 3.5–5.2)
ALK PHOS: 164 U/L — AB (ref 39–117)
ALT: 10 U/L (ref 0–35)
AST: 13 U/L (ref 0–37)
BUN: 9 mg/dL (ref 6–23)
CHLORIDE: 99 meq/L (ref 96–112)
CO2: 27 mEq/L (ref 19–32)
Calcium: 10.7 mg/dL — ABNORMAL HIGH (ref 8.4–10.5)
Creatinine, Ser: 0.7 mg/dL (ref 0.50–1.10)
GFR calc Af Amer: >90 mL/min (ref >90)
GFR calc non Af Amer: >90 mL/min (ref >90)
Glucose, Bld: 105 mg/dL — ABNORMAL HIGH (ref 70–99)
POTASSIUM: 3.9 meq/L (ref 3.7–5.3)
SODIUM: 135 meq/L — AB (ref 137–147)
Total Bilirubin: 0.2 mg/dL — ABNORMAL LOW (ref 0.3–1.2)
Total Protein: 8.3 g/dL (ref 6.0–8.3)

## 2014-01-12 LAB — CBC WITH DIFFERENTIAL/PLATELET
BASOS PCT: 1 % (ref 0–1)
Basophils Absolute: 0 10*3/uL (ref 0.0–0.1)
EOS ABS: 0.2 10*3/uL (ref 0.0–0.7)
Eosinophils Relative: 4 % (ref 0–5)
HCT: 39.8 % (ref 36.0–46.0)
Hemoglobin: 12.7 g/dL (ref 12.0–15.0)
LYMPHS ABS: 1.9 10*3/uL (ref 0.7–4.0)
Lymphocytes Relative: 34 % (ref 12–46)
MCH: 28.1 pg (ref 26.0–34.0)
MCHC: 31.9 g/dL (ref 30.0–36.0)
MCV: 88.1 fL (ref 78.0–100.0)
Monocytes Absolute: 0.6 10*3/uL (ref 0.1–1.0)
Monocytes Relative: 10 % (ref 3–12)
NEUTROS ABS: 2.8 10*3/uL (ref 1.7–7.7)
NEUTROS PCT: 52 % (ref 43–77)
PLATELETS: 345 10*3/uL (ref 150–400)
RBC: 4.52 MIL/uL (ref 3.87–5.11)
RDW: 13.9 % (ref 11.5–15.5)
WBC: 5.5 10*3/uL (ref 4.0–10.5)

## 2014-01-12 LAB — LIPASE, BLOOD: Lipase: 19 U/L (ref 11–59)

## 2014-01-12 LAB — POCT PREGNANCY, URINE: Preg Test, Ur: NEGATIVE

## 2014-01-12 SURGERY — CYSTOSCOPY, WITH RETROGRADE PYELOGRAM AND URETERAL STENT INSERTION
Anesthesia: Choice | Laterality: Left

## 2014-01-12 SURGERY — CYSTOSCOPY, WITH STENT INSERTION
Anesthesia: General | Site: Abdomen | Laterality: Left

## 2014-01-12 MED ORDER — KETOROLAC TROMETHAMINE 30 MG/ML IJ SOLN
30.0000 mg | Freq: Once | INTRAMUSCULAR | Status: AC
  Administered 2014-01-12: 30 mg via INTRAVENOUS
  Filled 2014-01-12: qty 1

## 2014-01-12 MED ORDER — SODIUM CHLORIDE 0.9 % IV BOLUS (SEPSIS)
1000.0000 mL | Freq: Once | INTRAVENOUS | Status: AC
  Administered 2014-01-12: 1000 mL via INTRAVENOUS

## 2014-01-12 MED ORDER — ONDANSETRON HCL 4 MG/2ML IJ SOLN
4.0000 mg | Freq: Once | INTRAMUSCULAR | Status: AC
  Administered 2014-01-12: 4 mg via INTRAVENOUS
  Filled 2014-01-12: qty 2

## 2014-01-12 MED ORDER — MORPHINE SULFATE 4 MG/ML IJ SOLN
4.0000 mg | Freq: Once | INTRAMUSCULAR | Status: AC
  Administered 2014-01-12: 4 mg via INTRAVENOUS
  Filled 2014-01-12: qty 1

## 2014-01-12 MED ORDER — PROPOFOL 10 MG/ML IV BOLUS
INTRAVENOUS | Status: AC
  Filled 2014-01-12: qty 20

## 2014-01-12 MED ORDER — FENTANYL CITRATE 0.05 MG/ML IJ SOLN
INTRAMUSCULAR | Status: AC
  Filled 2014-01-12: qty 2

## 2014-01-12 MED ORDER — DEXTROSE 5 % IV SOLN
1.0000 g | Freq: Once | INTRAVENOUS | Status: AC
  Administered 2014-01-12: 1 g via INTRAVENOUS
  Filled 2014-01-12: qty 10

## 2014-01-12 MED ORDER — LORAZEPAM 2 MG/ML IJ SOLN
1.0000 mg | Freq: Once | INTRAMUSCULAR | Status: AC
  Administered 2014-01-12: 1 mg via INTRAVENOUS
  Filled 2014-01-12: qty 1

## 2014-01-12 MED ORDER — MIDAZOLAM HCL 2 MG/2ML IJ SOLN
INTRAMUSCULAR | Status: AC
  Filled 2014-01-12: qty 2

## 2014-01-12 SURGICAL SUPPLY — 11 items
BAG URO CATCHER STRL LF (DRAPE) ×3 IMPLANT
CATH URET 5FR 28IN OPEN ENDED (CATHETERS) IMPLANT
CLOTH BEACON ORANGE TIMEOUT ST (SAFETY) ×3 IMPLANT
DRAPE CAMERA CLOSED 9X96 (DRAPES) ×3 IMPLANT
GLOVE SURG SS PI 8.0 STRL IVOR (GLOVE) IMPLANT
GOWN STRL REUS W/TWL XL LVL3 (GOWN DISPOSABLE) ×3 IMPLANT
MANIFOLD NEPTUNE II (INSTRUMENTS) ×3 IMPLANT
PACK CYSTO (CUSTOM PROCEDURE TRAY) ×3 IMPLANT
STENT CONTOUR 6FRX24X.038 (STENTS) ×3 IMPLANT
TUBING CONNECTING 10 (TUBING) ×2 IMPLANT
TUBING CONNECTING 10' (TUBING) ×1

## 2014-01-12 NOTE — ED Notes (Signed)
Pt reports 10/10 left flank pain. Hx of kidney stones. Positive n/v. Denies blood in urine.

## 2014-01-12 NOTE — ED Notes (Signed)
PA at bedside.

## 2014-01-12 NOTE — Anesthesia Preprocedure Evaluation (Addendum)
Anesthesia Evaluation  Patient identified by MRN, date of birth, ID band Patient awake    Reviewed: Allergy & Precautions, H&P , NPO status , Patient's Chart, lab work & pertinent test results  Airway Mallampati: II TM Distance: <3 FB Neck ROM: Full    Dental no notable dental hx. (+) Dental Advisory Given   Pulmonary shortness of breath, with exertion and lying, PE breath sounds clear to auscultation  + decreased breath sounds      Cardiovascular hypertension, Pt. on medications Rhythm:Regular Rate:Normal  H/O PE   Neuro/Psych negative neurological ROS  negative psych ROS   GI/Hepatic negative GI ROS, Neg liver ROS,   Endo/Other  Morbid obesity  Renal/GU negative Renal ROS  negative genitourinary   Musculoskeletal negative musculoskeletal ROS (+)   Abdominal   Peds negative pediatric ROS (+)  Hematology negative hematology ROS (+)   Anesthesia Other Findings   Reproductive/Obstetrics negative OB ROS                         Anesthesia Physical Anesthesia Plan  ASA: III and emergent  Anesthesia Plan: General   Post-op Pain Management:    Induction: Intravenous  Airway Management Planned: Oral ETT  Additional Equipment:   Intra-op Plan:   Post-operative Plan: Extubation in OR  Informed Consent: I have reviewed the patients History and Physical, chart, labs and discussed the procedure including the risks, benefits and alternatives for the proposed anesthesia with the patient or authorized representative who has indicated his/her understanding and acceptance.   Dental advisory given  Plan Discussed with: CRNA and Surgeon  Anesthesia Plan Comments:         Anesthesia Quick Evaluation

## 2014-01-12 NOTE — ED Provider Notes (Signed)
CSN: [id number]     Arrival date & time 01/12/14  1531 History   First MD Initiated Contact with Patient 01/12/14 1830     Chief Complaint  Patient presents with  . Flank Pain   (Consider location/radiation/quality/duration/timing/severity/associated sxs/prior Treatment) HPI Comments: Patient is a 43 year old female past medical history significant for recurrent kidney stones, hypertension, brain tumor presenting to the emergency department for 2 days of left colicky flank pain with radiation to left abdomen with associated nausea and 2 episodes of nonbloody nonbilious emesis. Patient states she has also had associated decreased urine with frequency. Patient denies any fevers. She endorses that this feels like previous kidney stones, stating she usually gets up 1 once a year. Patient states she has required lithotripsy and surgery for kidney stone removal in past. Denies any alleviating or aggravating factors. Denies any fevers. Abdominal surgical history includes cholecystectomy and kidney stone removal and tubal ligation.   Past Medical History  Diagnosis Date  . Kidney stone   . UTI (urinary tract infection)   . Obesity   . Brain tumor   . Radiation     Brain tumor  . Hypertension   . Pulmonary emboli    Past Surgical History  Procedure Laterality Date  . Cholecystectomy    . Kidney stone removal    . Tubal ligation    . Lithotripsy    . Cystoscopy w/ ureteroscopy     No family history on file. History  Substance Use Topics  . Smoking status: Never Smoker   . Smokeless tobacco: Not on file  . Alcohol Use: No   OB History   Grav Para Term Preterm Abortions TAB SAB Ect Mult Living                 Review of Systems  Constitutional: Negative for fever and chills.  Respiratory: Negative for shortness of breath.   Cardiovascular: Negative for chest pain.  Gastrointestinal: Positive for nausea, vomiting and abdominal pain.  Genitourinary: Positive for flank pain and  decreased urine volume.  Neurological: Negative for headaches.  All other systems reviewed and are negative.    Allergies  Penicillins and Latex  Home Medications   Current Outpatient Rx  Name  Route  Sig  Dispense  Refill  . albuterol (PROVENTIL HFA;VENTOLIN HFA) 108 (90 BASE) MCG/ACT inhaler   Inhalation   Inhale 2 puffs into the lungs every 6 (six) hours as needed. Shortness of breath         . HYDROcodone-acetaminophen (NORCO) 10-325 MG per tablet   Oral   Take 1 tablet by mouth every 6 (six) hours as needed for pain (pain).         Marland Kitchen lisinopril (PRINIVIL,ZESTRIL) 10 MG tablet   Oral   Take 10 mg by mouth daily.         Marland Kitchen LORazepam (ATIVAN) 1 MG tablet   Oral   Take 1 mg by mouth every 6 (six) hours as needed for anxiety (anxiety).          BP 146/78  Pulse 89  Temp(Src) 97.4 F (36.3 C) (Oral)  Resp 16  SpO2 100%  LMP 01/01/2014 Physical Exam  Constitutional: She is oriented to person, place, and time. She appears well-developed and well-nourished. No distress.  HENT:  Head: Normocephalic and atraumatic.  Right Ear: External ear normal.  Left Ear: External ear normal.  Nose: Nose normal.  Mouth/Throat: No oropharyngeal exudate.  Eyes: Conjunctivae are normal.  Neck: Normal range of  motion. Neck supple.  Cardiovascular: Normal rate, regular rhythm and normal heart sounds.   Pulmonary/Chest: Effort normal and breath sounds normal. No respiratory distress.  Abdominal: Soft. Bowel sounds are normal. There is tenderness in the suprapubic area and left lower quadrant. There is CVA tenderness (left sided). There is no rigidity, no rebound and no guarding.  Neurological: She is alert and oriented to person, place, and time.  Skin: Skin is warm and dry. She is not diaphoretic.    ED Course  Procedures (including critical care time) Medications  cefTRIAXone (ROCEPHIN) 1 g in dextrose 5 % 50 mL IVPB (1 g Intravenous New Bag/Given 01/12/14 2312)  sodium  chloride 0.9 % bolus 1,000 mL (0 mLs Intravenous Stopped 01/12/14 2115)  ketorolac (TORADOL) 30 MG/ML injection 30 mg (30 mg Intravenous Given 01/12/14 2026)  ondansetron (ZOFRAN) injection 4 mg (4 mg Intravenous Given 01/12/14 2015)  morphine 4 MG/ML injection 4 mg (4 mg Intravenous Given 01/12/14 2140)  morphine 4 MG/ML injection 4 mg (4 mg Intravenous Given 01/12/14 2312)  sodium chloride 0.9 % bolus 1,000 mL (1,000 mLs Intravenous New Bag/Given 01/12/14 2310)  LORazepam (ATIVAN) injection 1 mg (1 mg Intravenous Given 01/12/14 2312)    Labs Review Labs Reviewed  COMPREHENSIVE METABOLIC PANEL - Abnormal; Notable for the following:    Sodium 135 (*)    Glucose, Bld 105 (*)    Calcium 10.7 (*)    Alkaline Phosphatase 164 (*)    Total Bilirubin 0.2 (*)    All other components within normal limits  URINALYSIS, ROUTINE W REFLEX MICROSCOPIC - Abnormal; Notable for the following:    APPearance CLOUDY (*)    Hgb urine dipstick MODERATE (*)    Leukocytes, UA MODERATE (*)    All other components within normal limits  URINE MICROSCOPIC-ADD ON - Abnormal; Notable for the following:    Squamous Epithelial / LPF MANY (*)    Bacteria, UA MANY (*)    All other components within normal limits  URINE CULTURE  CBC WITH DIFFERENTIAL  LIPASE, BLOOD  POCT PREGNANCY, URINE   Imaging Review Ct Abdomen Pelvis Wo Contrast  01/12/2014   CLINICAL DATA:  Left flank pain.  EXAM: CT ABDOMEN AND PELVIS WITHOUT CONTRAST  TECHNIQUE: Multidetector CT imaging of the abdomen and pelvis was performed following the standard protocol without intravenous contrast.  COMPARISON:  CT 01/16/2013.  FINDINGS: Liver normal . Spleen normal. Pancreas normal. No biliary distention. Cholecystectomy.  Adrenals normal. No focal renal cortical abnormality. Previously identified left hydronephrosis and hydroureter have resolved. There is no evidence of hydronephrosis or hydroureter on either side. Bilateral extrarenal pelves are noted.  Nonobstructive nephrolithiasis with the largest stone measuring 12 mm . Bladder is nondistended. Phleboliths. Tiny 2 -3 mm calcific density left lower pelvis is most likely a phlebolith. Nonobstructing distal ureteral stone cannot be excluded. Uterus is unremarkable. Prior tubal ligation.  No significant adenopathy. Shotty inguinal and retroperitoneal lymph nodes. Aorta normal caliber.  Appendix normal. No inflammatory change in right or left lower quadrant. No bowel distention. No free air. No mesenteric lesions. For  Heart size normal. Lung bases are clear. No acute bony abnormality.  IMPRESSION: 1. Previously identified left hydronephrosis and hydroureter have resolved. Nonobstructing left nephrolithiasis is present with the largest stone measuring 12 mm. There is no hydronephrosis or hydroureter. Phleboliths are present. Nonobstructing distal left ureteral stone cannot be entirely excluded, however tiny calcific density noted in the lower pelvis is most likely a phlebolith. 2. Cholecystectomy.  Electronically Signed   By: Marcello Moores  Register   On: 01/12/2014 21:27    EKG Interpretation   None       MDM   1. Nephrolithiasis     Filed Vitals:   01/12/14 2145  BP: 146/78  Pulse: 89  Temp:   Resp: 16   10:30 PM Dr. Roni Bread will come see patient and admit for pain control and possible stent placement.   Afebrile, NAD, non-toxic appearing, AAOx4.   Abdomen soft, tender, no distention. Positive Left CVA tenderness. I have reviewed nursing notes, vital signs, and all appropriate lab and imaging results for this patient.33mm kidney stone appreciated, urology consult if he will admit patient for pain control and stent placement. Patient is agreeable to plan.       Harlow Mares, PA-C 01/13/14 [phone]

## 2014-01-12 NOTE — H&P (Signed)
Subjective: Carol Ortega is a 43 yo BF who I was asked to see in consultation by Dr. Leonides Schanz for the onset today of severe,but intermittant left flank pain that didn't resolve completely with toradol and 8mg  Morphine in the ER.  She has nausea with the pain and pressure and urgency to void.   She has had no hematuria.   A CT scan shows a 84mm non-obstructing mid pole stone and no hydro.   A small distal stone could not be ruled out.  She has had no fever.   Her UA has mild pyuria and moderate bacteria and epis and is probably a contaminated specimen.   She has had prior ESWL with ureteroscopy in [facility] and had a 3-50mm left ureteral stone that she passed in 1/14.     No current facility-administered medications on file prior to encounter.   Current Outpatient Prescriptions on File Prior to Encounter  Medication Sig Dispense Refill  . albuterol (PROVENTIL HFA;VENTOLIN HFA) 108 (90 BASE) MCG/ACT inhaler Inhale 2 puffs into the lungs every 6 (six) hours as needed. Shortness of breath      . HYDROcodone-acetaminophen (NORCO) 10-325 MG per tablet Take 1 tablet by mouth every 6 (six) hours as needed for pain (pain).      Marland Kitchen lisinopril (PRINIVIL,ZESTRIL) 10 MG tablet Take 10 mg by mouth daily.      Marland Kitchen LORazepam (ATIVAN) 1 MG tablet Take 1 mg by mouth every 6 (six) hours as needed for anxiety (anxiety).       Allergies  Allergen Reactions  . Penicillins Hives  . Latex Rash   Past Medical History  Diagnosis Date  . Kidney stone   . UTI (urinary tract infection)   . Obesity   . Brain tumor   . Radiation     Brain tumor  . Hypertension   . Pulmonary emboli    Past Surgical History  Procedure Laterality Date  . Cholecystectomy    . Kidney stone removal    . Tubal ligation    . Lithotripsy    . Cystoscopy w/ ureteroscopy     History   Social History  . Marital Status: Single    Spouse Name: N/A    Number of Children: N/A  . Years of Education: N/A   Occupational History  . Not on  file.   Social History Main Topics  . Smoking status: Never Smoker   . Smokeless tobacco: Not on file  . Alcohol Use: No  . Drug Use: No  . Sexual Activity: Not on file   Other Topics Concern  . Not on file   Social History Narrative  . No narrative on file   No family history on file.  ROS: Negative except as above and she does report frequent migraines.  She denies chest pain or SOB.  A full 12 point review was completed.   Objective: Vital signs in last 24 hours: Temp:  [97.4 F (36.3 C)] 97.4 F (36.3 C) (01/26 1540) Pulse Rate:  [74-89] 89 (01/26 2145) Resp:  [16-18] 16 (01/26 2145) BP: (130-151)/(72-110) 146/78 mmHg (01/26 2145) SpO2:  [98 %-100 %] 100 % (01/26 2145)  Intake/Output from previous day:   Intake/Output this shift:    General appearance: alert and no distress Head: Normocephalic, without obvious abnormality, atraumatic Neck: supple, symmetrical, trachea midline Resp: clear to auscultation bilaterally Cardio: regular rate and rhythm GI: soft, obese with LLQ tenderness and mild LCVAT.   No mass,hsm or  hernias noted.    Extremities: extremities normal, atraumatic, no cyanosis or edema Skin: Skin color, texture, turgor normal. No rashes or lesions Neurologic: Grossly normal She is morbidly obese.    Lab Results:   Recent Labs  01/12/14 1606  WBC 5.5  HGB 12.7  HCT 39.8  PLT 345   BMET  Recent Labs  01/12/14 1606  NA 135*  K 3.9  CL 99  CO2 27  GLUCOSE 105*  BUN 9  CREATININE 0.70  CALCIUM 10.7*   PT/INR No results found for this basename: LABPROT, INR,  in the last 72 hours ABG No results found for this basename: PHART, PCO2, PO2, HCO3,  in the last 72 hours  Studies/Results: Ct Abdomen Pelvis Wo Contrast  01/12/2014   CLINICAL DATA:  Left flank pain.  EXAM: CT ABDOMEN AND PELVIS WITHOUT CONTRAST  TECHNIQUE: Multidetector CT imaging of the abdomen and pelvis was performed following the standard protocol without intravenous  contrast.  COMPARISON:  CT 01/16/2013.  FINDINGS: Liver normal . Spleen normal. Pancreas normal. No biliary distention. Cholecystectomy.  Adrenals normal. No focal renal cortical abnormality. Previously identified left hydronephrosis and hydroureter have resolved. There is no evidence of hydronephrosis or hydroureter on either side. Bilateral extrarenal pelves are noted. Nonobstructive nephrolithiasis with the largest stone measuring 12 mm . Bladder is nondistended. Phleboliths. Tiny 2 -3 mm calcific density left lower pelvis is most likely a phlebolith. Nonobstructing distal ureteral stone cannot be excluded. Uterus is unremarkable. Prior tubal ligation.  No significant adenopathy. Shotty inguinal and retroperitoneal lymph nodes. Aorta normal caliber.  Appendix normal. No inflammatory change in right or left lower quadrant. No bowel distention. No free air. No mesenteric lesions. For  Heart size normal. Lung bases are clear. No acute bony abnormality.  IMPRESSION: 1. Previously identified left hydronephrosis and hydroureter have resolved. Nonobstructing left nephrolithiasis is present with the largest stone measuring 12 mm. There is no hydronephrosis or hydroureter. Phleboliths are present. Nonobstructing distal left ureteral stone cannot be entirely excluded, however tiny calcific density noted in the lower pelvis is most likely a phlebolith. 2. Cholecystectomy.   Electronically Signed   By: Marcello Moores  Register   On: 01/12/2014 21:27    Anti-infectives: Anti-infectives   Start     Dose/Rate Route Frequency Ordered Stop   01/12/14 2230  cefTRIAXone (ROCEPHIN) 1 g in dextrose 5 % 50 mL IVPB     1 g 100 mL/hr over 30 Minutes Intravenous  Once 01/12/14 2229        Current Facility-Administered Medications  Medication Dose Route Frequency Provider Last Rate Last Dose  . cefTRIAXone (ROCEPHIN) 1 g in dextrose 5 % 50 mL IVPB  1 g Intravenous Once Stephani Police Piepenbrink, PA-C       Current Outpatient  Prescriptions  Medication Sig Dispense Refill  . albuterol (PROVENTIL HFA;VENTOLIN HFA) 108 (90 BASE) MCG/ACT inhaler Inhale 2 puffs into the lungs every 6 (six) hours as needed. Shortness of breath      . HYDROcodone-acetaminophen (NORCO) 10-325 MG per tablet Take 1 tablet by mouth every 6 (six) hours as needed for pain (pain).      Marland Kitchen lisinopril (PRINIVIL,ZESTRIL) 10 MG tablet Take 10 mg by mouth daily.      Marland Kitchen LORazepam (ATIVAN) 1 MG tablet Take 1 mg by mouth every 6 (six) hours as needed for anxiety (anxiety).        Assessment: She has a 40mm LMP renal stone and left flank pain and irritative voiding symptoms without  hydro.  A small left distal stone can't be ruled out.   Plan: I discussed the options and at this time we are going to proceed with cystoscopy and left ureteral stenting which should take care of a small distal stone but also prepare the ureter for a later ureteroscopy with laser treatment of the large radiolucent renal stone.    I reviewed the risks of bleeding, infection, ureteral injury, stent irritation, need for secondary procedures, thrombotic events and anesthetic complications.    CC: Dr. Cyril Mourning Ward.     LOS: 0 days    Gino Garrabrant J 01/12/2014

## 2014-01-13 LAB — URINE CULTURE: Colony Count: >=100000

## 2014-01-13 MED ORDER — PHENAZOPYRIDINE HCL 200 MG PO TABS: 200.0000 mg | ORAL_TABLET | Freq: Three times a day (TID) | ORAL | Status: DC | PRN

## 2014-01-13 MED ORDER — SODIUM CHLORIDE 0.9 % IV SOLN: 250.0000 mL | INTRAVENOUS | Status: DC | PRN

## 2014-01-13 MED ORDER — FENTANYL CITRATE 0.05 MG/ML IJ SOLN
INTRAMUSCULAR | Status: DC | PRN
  Administered 2014-01-12: 50 ug via INTRAVENOUS
  Administered 2014-01-13 (×2): 25 ug via INTRAVENOUS

## 2014-01-13 MED ORDER — CIPROFLOXACIN HCL 500 MG PO TABS: 500.0000 mg | ORAL_TABLET | Freq: Two times a day (BID) | ORAL | Status: DC

## 2014-01-13 MED ORDER — ONDANSETRON HCL 4 MG/2ML IJ SOLN: 4.0000 mg | Freq: Four times a day (QID) | INTRAMUSCULAR | Status: DC | PRN

## 2014-01-13 MED ORDER — SODIUM CHLORIDE 0.9 % IR SOLN
Status: DC | PRN
  Administered 2014-01-13: 3000 mL

## 2014-01-13 MED ORDER — FENTANYL CITRATE 0.05 MG/ML IJ SOLN: 25.0000 ug | INTRAMUSCULAR | Status: DC | PRN

## 2014-01-13 MED ORDER — OXYCODONE HCL 5 MG PO TABS: 5.0000 mg | ORAL_TABLET | ORAL | Status: DC | PRN

## 2014-01-13 MED ORDER — 0.9 % SODIUM CHLORIDE (POUR BTL) OPTIME
TOPICAL | Status: DC | PRN
  Administered 2014-01-13: 1000 mL

## 2014-01-13 MED ORDER — ACETAMINOPHEN 325 MG PO TABS: 650.0000 mg | ORAL_TABLET | ORAL | Status: DC | PRN

## 2014-01-13 MED ORDER — PHENAZOPYRIDINE HCL 200 MG PO TABS
ORAL_TABLET | ORAL | Status: AC
  Filled 2014-01-13: qty 1

## 2014-01-13 MED ORDER — ONDANSETRON HCL 4 MG/2ML IJ SOLN
INTRAMUSCULAR | Status: AC
  Filled 2014-01-13: qty 2

## 2014-01-13 MED ORDER — LIDOCAINE HCL (CARDIAC) 20 MG/ML IV SOLN
INTRAVENOUS | Status: AC
  Filled 2014-01-13: qty 5

## 2014-01-13 MED ORDER — SODIUM CHLORIDE 0.9 % IV SOLN
INTRAVENOUS | Status: DC | PRN
  Administered 2014-01-12: via INTRAVENOUS

## 2014-01-13 MED ORDER — SODIUM CHLORIDE 0.9 % IJ SOLN: 3.0000 mL | INTRAMUSCULAR | Status: DC | PRN

## 2014-01-13 MED ORDER — KETOROLAC TROMETHAMINE 30 MG/ML IJ SOLN: 15.0000 mg | Freq: Once | INTRAMUSCULAR | Status: DC | PRN

## 2014-01-13 MED ORDER — DEXAMETHASONE SODIUM PHOSPHATE 10 MG/ML IJ SOLN
INTRAMUSCULAR | Status: DC | PRN
  Administered 2014-01-13: 10 mg via INTRAVENOUS

## 2014-01-13 MED ORDER — MIDAZOLAM HCL 5 MG/5ML IJ SOLN
INTRAMUSCULAR | Status: DC | PRN
  Administered 2014-01-12: 2 mg via INTRAVENOUS

## 2014-01-13 MED ORDER — ONDANSETRON HCL 4 MG/2ML IJ SOLN
INTRAMUSCULAR | Status: DC | PRN
Start: 2014-01-13 — End: 2014-01-13
  Administered 2014-01-13: 4 mg via INTRAVENOUS

## 2014-01-13 MED ORDER — SODIUM CHLORIDE 0.9 % IV SOLN: INTRAVENOUS | Status: DC

## 2014-01-13 MED ORDER — DEXAMETHASONE SODIUM PHOSPHATE 10 MG/ML IJ SOLN
INTRAMUSCULAR | Status: AC
  Filled 2014-01-13: qty 1

## 2014-01-13 MED ORDER — SODIUM CHLORIDE 0.9 % IJ SOLN: 3.0000 mL | Freq: Two times a day (BID) | INTRAMUSCULAR | Status: DC

## 2014-01-13 MED ORDER — ACETAMINOPHEN 650 MG RE SUPP
650.0000 mg | RECTAL | Status: DC | PRN
  Filled 2014-01-13: qty 1

## 2014-01-13 MED ORDER — PROMETHAZINE HCL 25 MG/ML IJ SOLN: 6.2500 mg | INTRAMUSCULAR | Status: DC | PRN

## 2014-01-13 MED ORDER — IOHEXOL 300 MG/ML  SOLN
INTRAMUSCULAR | Status: DC | PRN
  Administered 2014-01-13: 50 mL

## 2014-01-13 MED ORDER — SUCCINYLCHOLINE CHLORIDE 20 MG/ML IJ SOLN
INTRAMUSCULAR | Status: DC | PRN
  Administered 2014-01-12: 250 mg via INTRAVENOUS

## 2014-01-13 MED ORDER — LIDOCAINE HCL (CARDIAC) 20 MG/ML IV SOLN
INTRAVENOUS | Status: DC | PRN
  Administered 2014-01-12: 50 mg via INTRAVENOUS

## 2014-01-13 NOTE — ED Provider Notes (Signed)
Medical screening examination/treatment/procedure(s) were performed by non-physician practitioner and as supervising physician I was immediately available for consultation/collaboration.  EKG Interpretation    Date/Time:    Ventricular Rate:    PR Interval:    QRS Duration:   QT Interval:    QTC Calculation:   R Axis:     Text Interpretation:                [place], DO 01/13/14 2111

## 2014-01-13 NOTE — Transfer of Care (Signed)
Immediate Anesthesia Transfer of Care Note  Patient: Carol Ortega  Procedure(s) Performed: Procedure(s) (LRB): CYSTOSCOPY WITH STENT PLACEMENT left retrograde (Left)  Patient Location: PACU  Anesthesia Type: General  Level of Consciousness: sedated, patient cooperative and responds to stimulation  Airway & Oxygen Therapy: Patient Spontanous Breathing and Patient connected to face mask oxgen  Post-op Assessment: Report given to PACU RN and Post -op Vital signs reviewed and stable  Post vital signs: Reviewed and stable  Complications: No apparent anesthesia complications

## 2014-01-13 NOTE — Brief Op Note (Signed)
01/12/2014 - 01/13/2014  12:21 AM  PATIENT:  Elvera Maria  43 y.o. female  PRE-OPERATIVE DIAGNOSIS:  left nephrolithiasis  POST-OPERATIVE DIAGNOSIS:  left nephrolithiasis  PROCEDURE:  Procedure(s): CYSTOSCOPY WITH STENT PLACEMENT left retrograde (Left)  SURGEON:  Surgeon(s) and Role:    * Irine Seal, MD - Primary  PHYSICIAN ASSISTANT:   ASSISTANTS: none   ANESTHESIA:   general  EBL:     BLOOD ADMINISTERED:none  DRAINS: 6 x 26 left JJ stent   LOCAL MEDICATIONS USED:  NONE  SPECIMEN:  No Specimen  DISPOSITION OF SPECIMEN:  N/A  COUNTS:  YES  TOURNIQUET:  * No tourniquets in log *  DICTATION: .Other Dictation: Dictation Number [id number]  PLAN OF CARE: Discharge to home after PACU  PATIENT DISPOSITION:  PACU - hemodynamically stable.   Delay start of Pharmacological VTE agent (>24hrs) due to surgical blood loss or risk of bleeding: not applicable

## 2014-01-13 NOTE — Discharge Instructions (Signed)

## 2014-01-14 ENCOUNTER — Other Ambulatory Visit: Payer: Self-pay | Admitting: Urology

## 2014-01-14 ENCOUNTER — Encounter (HOSPITAL_COMMUNITY): Payer: Self-pay | Admitting: Urology

## 2014-01-14 NOTE — Op Note (Signed)
NAMESHEELA, [name]            ACCOUNT NO.:  [id number]  MEDICAL RECORD NO.:  [id number]  LOCATION:                                 FACILITY:  PHYSICIAN:  Carol Ortega, M.D.    DATE OF BIRTH:  [birth year]  DATE OF PROCEDURE:  01/13/2014 DATE OF DISCHARGE:                              OPERATIVE REPORT   PROCEDURE:  Cystoscopy, left retrograde pyelogram with interpretation, insertion of left double-J stent.  PREOPERATIVE DIAGNOSIS:  Left renal stone with left upper quadrant pain and possible left distal stone.  POSTOPERATIVE DIAGNOSIS:  Left renal stone with possible ureteral debris.  SURGEON:  Carol Ortega, M.D.  ANESTHESIA:  General.  SPECIMEN:  None.  DRAINS:  A 6-French x 26-cm left double-J stent.  COMPLICATIONS:  None.  INDICATIONS:  Carol Ortega is a 43 year old, African American female with a history of urolithiasis who presented to the emergency room on the 26th with left flank pain.  A CT scan was obtained, which demonstrated a 12 mm left mid renal stone without obstruction.  There was a question, it was difficult to determine whether she had a distal stone.  However, after reviewing the options with the patient, I decided to go ahead with cystoscopy, retrograde pyelogram, and stent placement, because even if she did not have a distal stone she would need stenting for soft dilation of the left ureter prior to an attempt at left ureterostomy for definitive treatment of her 12 mm radiolucent stone.  FINDINGS OF PROCEDURE:  She had been given Rocephin in the emergency room.  She was taken to the operating room where general anesthetic was induced.  She was placed in lithotomy position.  Her perineum and genitalia were prepped with Betadine solution.  She was draped in usual sterile fashion.  Cystoscopy was performed using a 22-French scope and 12-degree lens. Examination revealed a normal urethra.  The bladder wall had mild trabeculation.  No tumors were  noted.  Ureteral orifices were unremarkable.  Adjacent to the right ureteral orifice, there was a small ball of mucous material appeared that it could have passed from the distal ureter.  At this point, a 5-French open-end catheter was passed and the left ureteral orifice was cannulated.  Contrast was instilled.  Left retrograde pyelogram revealed a very delicate left ureter to an extrarenal pelvis.  There was a filling defect in the mid to lower pole consistent with the stone seen on CT.  Once retrograde pyelogram was completed, a guidewire was passed to the kidney, and a 6-French x 26-cm double-J stent without string was passed to the kidney under fluoroscopic guidance.  Wire was removed leaving good coil in the kidney and good coil in the bladder.  The bladder was drained.  The patient was taken down from the lithotomy position.  Her anesthetic was reversed.  She was moved to recovery room in stable condition.  There were no complications.     Carol Ortega, M.D.     JJW/MEDQ  D:  01/13/2014  T:  01/13/2014  Job:  [id number]

## 2014-01-15 ENCOUNTER — Encounter (HOSPITAL_COMMUNITY): Payer: Self-pay | Admitting: Pharmacy Technician

## 2014-01-15 NOTE — Anesthesia Postprocedure Evaluation (Signed)
  Anesthesia Post-op Note  Patient: Carol Ortega  Procedure(s) Performed: Procedure(s) (LRB): CYSTOSCOPY WITH STENT PLACEMENT left retrograde (Left)  Patient Location: PACU  Anesthesia Type: General  Level of Consciousness: awake and alert   Airway and Oxygen Therapy: Patient Spontanous Breathing  Post-op Pain: mild  Post-op Assessment: Post-op Vital signs reviewed, Patient's Cardiovascular Status Stable, Respiratory Function Stable, Patent Airway and No signs of Nausea or vomiting  Last Vitals:  Filed Vitals:   01/13/14 0115  BP:   Pulse: 97  Temp:   Resp:     Post-op Vital Signs: stable   Complications: No apparent anesthesia complications

## 2014-01-16 NOTE — Progress Notes (Signed)
Cbc with dif, cmet, urine pregnancy, lipase epic Chest 2 view xray 12-13-13 epic ekg 12-13-13 epic

## 2014-01-16 NOTE — Patient Instructions (Addendum)
Laie  01/16/2014   Your procedure is scheduled on: Tuesday February 3rd  Report to [facility] at 900 AM.  Call this number if you have problems the morning of surgery [phone]   Remember: NO VISITORS UNDER AGE 43 PER [place].   Do not eat food or drink liquids :After Midnight.     Take these medicines the morning of surgery with A SIP OF WATER: albuterol inhaler if needed, bring and leave inhaler with your son justin, hydrocodone if needed, ativan if needed                                SEE Guy may not have any metal on your body including hair pins and piercings  Do not wear jewelry, make-up.  Do not wear lotions, powders, or perfumes. No  Deodorant is to be worn.   Men may shave face and neck.  Do not bring valuables to the hospital. [place].  Contacts, dentures or bridgework may not be worn into surgery.  Leave suitcase in the car. After surgery it may be brought to your room.  For patients admitted to the hospital, checkout time is 11:00 AM the day of discharge.   Patients discharged the day of surgery will not be allowed to drive home.  Name and phone number of your driver: Son Gayla Medicus cell [phone]  Special Instructions: N/A  Please read over the following fact sheets that you were given: [facility] Preparing for surgery sheet  Call Zelphia Cairo RN pre op nurse if needed [phone]    [place].  PATIENT SIGNATURE___________________________________________  NURSE SIGNATURE_____________________________________________

## 2014-01-19 ENCOUNTER — Encounter (HOSPITAL_COMMUNITY): Payer: Self-pay

## 2014-01-19 ENCOUNTER — Encounter (INDEPENDENT_AMBULATORY_CARE_PROVIDER_SITE_OTHER): Payer: Self-pay

## 2014-01-19 ENCOUNTER — Encounter (HOSPITAL_COMMUNITY)
Admission: RE | Admit: 2014-01-19 | Discharge: 2014-01-19 | Disposition: A | Discharge status: Home / Self Care | Payer: Medicaid Other | Source: Ambulatory Visit | Attending: Urology | Admitting: Urology

## 2014-01-19 HISTORY — DX: Adverse effect of unspecified anesthetic, initial encounter: T41.45XA

## 2014-01-19 NOTE — Progress Notes (Addendum)
Last mri 10-02-2013 care everywhere, unable to print mri results lov note dr Bridgett Larsson neurology 10-02-2013 care everywhere and on chart

## 2014-01-20 ENCOUNTER — Encounter (HOSPITAL_COMMUNITY): Payer: Self-pay | Admitting: *Deleted

## 2014-01-20 ENCOUNTER — Encounter (HOSPITAL_COMMUNITY): Payer: Medicaid Other | Admitting: Anesthesiology

## 2014-01-20 ENCOUNTER — Ambulatory Visit (HOSPITAL_COMMUNITY): Payer: Medicaid Other | Admitting: Anesthesiology

## 2014-01-20 ENCOUNTER — Encounter (HOSPITAL_COMMUNITY)
Admission: RE | Disposition: A | Discharge status: Home / Self Care | Payer: Self-pay | Source: Ambulatory Visit | Attending: Urology

## 2014-01-20 ENCOUNTER — Ambulatory Visit (HOSPITAL_COMMUNITY)
Admission: RE | Admit: 2014-01-20 | Discharge: 2014-01-20 | Disposition: A | Discharge status: Home / Self Care | Payer: Medicaid Other | Source: Ambulatory Visit | Attending: Urology | Admitting: Urology

## 2014-01-20 DIAGNOSIS — Z86711 Personal history of pulmonary embolism: Secondary | ICD-10-CM | POA: Insufficient documentation

## 2014-01-20 DIAGNOSIS — N201 Calculus of ureter: Secondary | ICD-10-CM | POA: Insufficient documentation

## 2014-01-20 DIAGNOSIS — G43909 Migraine, unspecified, not intractable, without status migrainosus: Secondary | ICD-10-CM | POA: Insufficient documentation

## 2014-01-20 DIAGNOSIS — Z79899 Other long term (current) drug therapy: Secondary | ICD-10-CM | POA: Insufficient documentation

## 2014-01-20 DIAGNOSIS — Z9089 Acquired absence of other organs: Secondary | ICD-10-CM | POA: Insufficient documentation

## 2014-01-20 DIAGNOSIS — Z9851 Tubal ligation status: Secondary | ICD-10-CM | POA: Insufficient documentation

## 2014-01-20 DIAGNOSIS — I10 Essential (primary) hypertension: Secondary | ICD-10-CM | POA: Insufficient documentation

## 2014-01-20 DIAGNOSIS — Z923 Personal history of irradiation: Secondary | ICD-10-CM | POA: Insufficient documentation

## 2014-01-20 DIAGNOSIS — Z87440 Personal history of urinary (tract) infections: Secondary | ICD-10-CM | POA: Insufficient documentation

## 2014-01-20 HISTORY — PX: CYSTOSCOPY WITH RETROGRADE PYELOGRAM, URETEROSCOPY AND STENT PLACEMENT: SHX5789

## 2014-01-20 HISTORY — PX: HOLMIUM LASER APPLICATION: SHX5852

## 2014-01-20 LAB — GLUCOSE, CAPILLARY: GLUCOSE-CAPILLARY: 122 mg/dL — AB (ref 70–99)

## 2014-01-20 SURGERY — CYSTOURETEROSCOPY, WITH RETROGRADE PYELOGRAM AND STENT INSERTION
Anesthesia: General | Laterality: Left

## 2014-01-20 MED ORDER — ESMOLOL HCL 10 MG/ML IV SOLN
INTRAVENOUS | Status: DC | PRN
  Administered 2014-01-20: 40 mg via INTRAVENOUS

## 2014-01-20 MED ORDER — PROPOFOL 10 MG/ML IV BOLUS
INTRAVENOUS | Status: AC
  Filled 2014-01-20: qty 20

## 2014-01-20 MED ORDER — OXYCODONE HCL 5 MG PO TABS
ORAL_TABLET | ORAL | Status: AC
  Filled 2014-01-20: qty 2

## 2014-01-20 MED ORDER — HYOSCYAMINE SULFATE 0.125 MG SL SUBL
0.1250 mg | SUBLINGUAL_TABLET | SUBLINGUAL | Status: AC
  Administered 2014-01-20: 0.125 mg via SUBLINGUAL
  Filled 2014-01-20 (×2): qty 1

## 2014-01-20 MED ORDER — LEVOFLOXACIN 500 MG PO TABS: 500.0000 mg | ORAL_TABLET | Freq: Every day | ORAL | Status: DC

## 2014-01-20 MED ORDER — ACETAMINOPHEN 650 MG RE SUPP
650.0000 mg | RECTAL | Status: DC | PRN
  Filled 2014-01-20: qty 1

## 2014-01-20 MED ORDER — BELLADONNA ALKALOIDS-OPIUM 16.2-60 MG RE SUPP
RECTAL | Status: DC | PRN
Start: 2014-01-20 — End: 2014-01-20
  Administered 2014-01-20: 1 via RECTAL

## 2014-01-20 MED ORDER — LACTATED RINGERS IV SOLN: INTRAVENOUS | Status: DC

## 2014-01-20 MED ORDER — GENTAMICIN SULFATE 40 MG/ML IJ SOLN
500.0000 mg | Freq: Once | INTRAMUSCULAR | Status: DC
  Filled 2014-01-20: qty 12.5

## 2014-01-20 MED ORDER — HYDROMORPHONE HCL PF 1 MG/ML IJ SOLN
0.2500 mg | INTRAMUSCULAR | Status: DC | PRN
Start: 2014-01-20 — End: 2014-01-20
  Administered 2014-01-20: 0.25 mg via INTRAVENOUS

## 2014-01-20 MED ORDER — LABETALOL HCL 5 MG/ML IV SOLN
INTRAVENOUS | Status: DC | PRN
  Administered 2014-01-20 (×2): 5 mg via INTRAVENOUS

## 2014-01-20 MED ORDER — OXYCODONE HCL 5 MG PO TABS
5.0000 mg | ORAL_TABLET | ORAL | Status: DC | PRN
  Administered 2014-01-20: 10 mg via ORAL

## 2014-01-20 MED ORDER — ESMOLOL HCL 10 MG/ML IV SOLN
INTRAVENOUS | Status: AC
  Filled 2014-01-20: qty 10

## 2014-01-20 MED ORDER — MIDAZOLAM HCL 5 MG/5ML IJ SOLN
INTRAMUSCULAR | Status: DC | PRN
  Administered 2014-01-20: 1 mg via INTRAVENOUS

## 2014-01-20 MED ORDER — CIPROFLOXACIN IN D5W 400 MG/200ML IV SOLN
INTRAVENOUS | Status: AC
  Filled 2014-01-20: qty 200

## 2014-01-20 MED ORDER — TAMSULOSIN HCL 0.4 MG PO CAPS
0.4000 mg | ORAL_CAPSULE | ORAL | Status: AC
Start: 2014-01-20 — End: 2014-01-20
  Administered 2014-01-20: 0.4 mg via ORAL
  Filled 2014-01-20 (×2): qty 1

## 2014-01-20 MED ORDER — ONDANSETRON HCL 4 MG/2ML IJ SOLN
INTRAMUSCULAR | Status: AC
  Filled 2014-01-20: qty 2

## 2014-01-20 MED ORDER — FENTANYL CITRATE 0.05 MG/ML IJ SOLN
INTRAMUSCULAR | Status: DC | PRN
Start: 2014-01-20 — End: 2014-01-20
  Administered 2014-01-20: 25 ug via INTRAVENOUS
  Administered 2014-01-20: 75 ug via INTRAVENOUS
  Administered 2014-01-20: 50 ug via INTRAVENOUS
  Administered 2014-01-20: 25 ug via INTRAVENOUS
  Administered 2014-01-20 (×2): 50 ug via INTRAVENOUS
  Administered 2014-01-20: 25 ug via INTRAVENOUS

## 2014-01-20 MED ORDER — ALBUTEROL SULFATE (2.5 MG/3ML) 0.083% IN NEBU
INHALATION_SOLUTION | RESPIRATORY_TRACT | Status: AC
  Filled 2014-01-20: qty 3

## 2014-01-20 MED ORDER — LACTATED RINGERS IV SOLN
INTRAVENOUS | Status: DC | PRN
  Administered 2014-01-20 (×2): via INTRAVENOUS

## 2014-01-20 MED ORDER — PROMETHAZINE HCL 25 MG/ML IJ SOLN
INTRAMUSCULAR | Status: AC
  Filled 2014-01-20: qty 1

## 2014-01-20 MED ORDER — GENTAMICIN SULFATE 40 MG/ML IJ SOLN
5.0000 mg/kg | INTRAVENOUS | Status: DC
  Administered 2014-01-20: 500 mg via INTRAVENOUS

## 2014-01-20 MED ORDER — SODIUM CHLORIDE 0.9 % IJ SOLN: 3.0000 mL | Freq: Two times a day (BID) | INTRAMUSCULAR | Status: DC

## 2014-01-20 MED ORDER — LIDOCAINE HCL (CARDIAC) 20 MG/ML IV SOLN
INTRAVENOUS | Status: AC
  Filled 2014-01-20: qty 5

## 2014-01-20 MED ORDER — SODIUM CHLORIDE 3 % IN NEBU
INHALATION_SOLUTION | RESPIRATORY_TRACT | Status: AC
  Filled 2014-01-20: qty 15

## 2014-01-20 MED ORDER — FENTANYL CITRATE 0.05 MG/ML IJ SOLN
INTRAMUSCULAR | Status: AC
  Filled 2014-01-20: qty 2

## 2014-01-20 MED ORDER — FENTANYL CITRATE 0.05 MG/ML IJ SOLN: 25.0000 ug | INTRAMUSCULAR | Status: DC | PRN

## 2014-01-20 MED ORDER — ONDANSETRON HCL 4 MG/2ML IJ SOLN
INTRAMUSCULAR | Status: DC | PRN
  Administered 2014-01-20: 4 mg via INTRAVENOUS

## 2014-01-20 MED ORDER — ALBUTEROL SULFATE (2.5 MG/3ML) 0.083% IN NEBU
2.5000 mg | INHALATION_SOLUTION | Freq: Once | RESPIRATORY_TRACT | Status: AC
  Administered 2014-01-20: 2.5 mg via RESPIRATORY_TRACT

## 2014-01-20 MED ORDER — 0.9 % SODIUM CHLORIDE (POUR BTL) OPTIME
TOPICAL | Status: DC | PRN
  Administered 2014-01-20: 1000 mL

## 2014-01-20 MED ORDER — BELLADONNA ALKALOIDS-OPIUM 16.2-60 MG RE SUPP
RECTAL | Status: AC
  Filled 2014-01-20: qty 1

## 2014-01-20 MED ORDER — LIDOCAINE HCL (CARDIAC) 20 MG/ML IV SOLN
INTRAVENOUS | Status: DC | PRN
  Administered 2014-01-20: 100 mg via INTRAVENOUS

## 2014-01-20 MED ORDER — MEPERIDINE HCL 50 MG/ML IJ SOLN: 6.2500 mg | INTRAMUSCULAR | Status: DC | PRN

## 2014-01-20 MED ORDER — PROPOFOL 10 MG/ML IV BOLUS
INTRAVENOUS | Status: DC | PRN
  Administered 2014-01-20: 300 mg via INTRAVENOUS

## 2014-01-20 MED ORDER — IOHEXOL 300 MG/ML  SOLN
INTRAMUSCULAR | Status: DC | PRN
  Administered 2014-01-20: 10 mL via URETHRAL

## 2014-01-20 MED ORDER — SODIUM CHLORIDE 0.9 % IV SOLN: 250.0000 mL | INTRAVENOUS | Status: DC | PRN

## 2014-01-20 MED ORDER — HYDROMORPHONE HCL PF 1 MG/ML IJ SOLN
INTRAMUSCULAR | Status: AC
  Filled 2014-01-20: qty 1

## 2014-01-20 MED ORDER — ONDANSETRON HCL 4 MG/2ML IJ SOLN: 4.0000 mg | Freq: Four times a day (QID) | INTRAMUSCULAR | Status: DC | PRN

## 2014-01-20 MED ORDER — SODIUM CHLORIDE 0.9 % IJ SOLN: 3.0000 mL | INTRAMUSCULAR | Status: DC | PRN

## 2014-01-20 MED ORDER — CIPROFLOXACIN IN D5W 400 MG/200ML IV SOLN
400.0000 mg | INTRAVENOUS | Status: AC
  Administered 2014-01-20: 400 mg via INTRAVENOUS

## 2014-01-20 MED ORDER — MIDAZOLAM HCL 2 MG/2ML IJ SOLN
INTRAMUSCULAR | Status: AC
  Filled 2014-01-20: qty 2

## 2014-01-20 MED ORDER — ACETAMINOPHEN 325 MG PO TABS: 650.0000 mg | ORAL_TABLET | ORAL | Status: DC | PRN

## 2014-01-20 MED ORDER — PROMETHAZINE HCL 25 MG/ML IJ SOLN
6.2500 mg | INTRAMUSCULAR | Status: DC | PRN
  Administered 2014-01-20: 12.5 mg via INTRAVENOUS

## 2014-01-20 MED ORDER — SODIUM CHLORIDE 0.9 % IR SOLN
Status: DC | PRN
  Administered 2014-01-20: 2000 mL

## 2014-01-20 MED ORDER — LABETALOL HCL 5 MG/ML IV SOLN
INTRAVENOUS | Status: AC
  Filled 2014-01-20: qty 4

## 2014-01-20 SURGICAL SUPPLY — 15 items
BAG URO CATCHER STRL LF (DRAPE) ×3 IMPLANT
BASKET ZERO TIP NITINOL 2.4FR (BASKET) ×3 IMPLANT
CATH URET 5FR 28IN OPEN ENDED (CATHETERS) IMPLANT
CLOTH BEACON ORANGE TIMEOUT ST (SAFETY) ×3 IMPLANT
DRAPE CAMERA CLOSED 9X96 (DRAPES) ×3 IMPLANT
FIBER LASER FLEXIVA 200 (UROLOGICAL SUPPLIES) ×3 IMPLANT
GLOVE SURG SS PI 8.0 STRL IVOR (GLOVE) IMPLANT
GOWN STRL REUS W/TWL XL LVL3 (GOWN DISPOSABLE) ×3 IMPLANT
GUIDEWIRE STR DUAL SENSOR (WIRE) ×6 IMPLANT
MANIFOLD NEPTUNE II (INSTRUMENTS) ×3 IMPLANT
PACK CYSTO (CUSTOM PROCEDURE TRAY) ×3 IMPLANT
SHEATH ACCESS URETERAL 38CM (SHEATH) ×3 IMPLANT
STENT CONTOUR 6FRX26X.038 (STENTS) ×3 IMPLANT
TUBING CONNECTING 10 (TUBING) ×2 IMPLANT
TUBING CONNECTING 10' (TUBING) ×1

## 2014-01-20 NOTE — Transfer of Care (Signed)
Immediate Anesthesia Transfer of Care Note  Patient: Carol Ortega  Procedure(s) Performed: Procedure(s): LEFT URETEROSCOPY WITH STENT PLACEMENT (Left) HOLMIUM LASER APPLICATION (Left)  Patient Location: PACU  Anesthesia Type:General  Level of Consciousness: sedated  Airway & Oxygen Therapy: Patient Spontanous Breathing and Patient connected to face mask oxygen  Post-op Assessment: Report given to PACU RN and Post -op Vital signs reviewed and stable  Post vital signs: Reviewed and stable  Complications: No apparent anesthesia complications

## 2014-01-20 NOTE — Interval H&P Note (Signed)
History and Physical Interval Note:  She had  A stent placed and is for definitive stone management today.  01/20/2014 10:17 AM  Carol Ortega  has presented today for surgery, with the diagnosis of LEFT RENAL STONE  The various methods of treatment have been discussed with the patient and family. After consideration of risks, benefits and other options for treatment, the patient has consented to  Procedure(s): LEFT URETEROSCOPY WITH STENT PLACEMENT (Left) HOLMIUM LASER APPLICATION (Left) as a surgical intervention .  The patient's history has been reviewed, patient examined, no change in status, stable for surgery.  I have reviewed the patient's chart and labs.  Questions were answered to the patient's satisfaction.     Malky Rudzinski J

## 2014-01-20 NOTE — Preoperative (Signed)
Beta Blockers   Reason not to administer Beta Blockers:Not Applicable 

## 2014-01-20 NOTE — H&P (View-Only) (Signed)
Subjective: Carol Ortega is a 43 yo BF who I was asked to see in consultation by Dr. Leonides Schanz for the onset today of severe,but intermittant left flank pain that didn't resolve completely with toradol and 8mg  Morphine in the ER.  She has nausea with the pain and pressure and urgency to void.   She has had no hematuria.   A CT scan shows a 84mm non-obstructing mid pole stone and no hydro.   A small distal stone could not be ruled out.  She has had no fever.   Her UA has mild pyuria and moderate bacteria and epis and is probably a contaminated specimen.   She has had prior ESWL with ureteroscopy in [facility] and had a 3-50mm left ureteral stone that she passed in 1/14.     No current facility-administered medications on file prior to encounter.   Current Outpatient Prescriptions on File Prior to Encounter  Medication Sig Dispense Refill  . albuterol (PROVENTIL HFA;VENTOLIN HFA) 108 (90 BASE) MCG/ACT inhaler Inhale 2 puffs into the lungs every 6 (six) hours as needed. Shortness of breath      . HYDROcodone-acetaminophen (NORCO) 10-325 MG per tablet Take 1 tablet by mouth every 6 (six) hours as needed for pain (pain).      Marland Kitchen lisinopril (PRINIVIL,ZESTRIL) 10 MG tablet Take 10 mg by mouth daily.      Marland Kitchen LORazepam (ATIVAN) 1 MG tablet Take 1 mg by mouth every 6 (six) hours as needed for anxiety (anxiety).       Allergies  Allergen Reactions  . Penicillins Hives  . Latex Rash   Past Medical History  Diagnosis Date  . Kidney stone   . UTI (urinary tract infection)   . Obesity   . Brain tumor   . Radiation     Brain tumor  . Hypertension   . Pulmonary emboli    Past Surgical History  Procedure Laterality Date  . Cholecystectomy    . Kidney stone removal    . Tubal ligation    . Lithotripsy    . Cystoscopy w/ ureteroscopy     History   Social History  . Marital Status: Single    Spouse Name: N/A    Number of Children: N/A  . Years of Education: N/A   Occupational History  . Not on  file.   Social History Main Topics  . Smoking status: Never Smoker   . Smokeless tobacco: Not on file  . Alcohol Use: No  . Drug Use: No  . Sexual Activity: Not on file   Other Topics Concern  . Not on file   Social History Narrative  . No narrative on file   No family history on file.  ROS: Negative except as above and she does report frequent migraines.  She denies chest pain or SOB.  A full 12 point review was completed.   Objective: Vital signs in last 24 hours: Temp:  [97.4 F (36.3 C)] 97.4 F (36.3 C) (01/26 1540) Pulse Rate:  [74-89] 89 (01/26 2145) Resp:  [16-18] 16 (01/26 2145) BP: (130-151)/(72-110) 146/78 mmHg (01/26 2145) SpO2:  [98 %-100 %] 100 % (01/26 2145)  Intake/Output from previous day:   Intake/Output this shift:    General appearance: alert and no distress Head: Normocephalic, without obvious abnormality, atraumatic Neck: supple, symmetrical, trachea midline Resp: clear to auscultation bilaterally Cardio: regular rate and rhythm GI: soft, obese with LLQ tenderness and mild LCVAT.   No mass,hsm or  hernias noted.    Extremities: extremities normal, atraumatic, no cyanosis or edema Skin: Skin color, texture, turgor normal. No rashes or lesions Neurologic: Grossly normal She is morbidly obese.    Lab Results:   Recent Labs  01/12/14 1606  WBC 5.5  HGB 12.7  HCT 39.8  PLT 345   BMET  Recent Labs  01/12/14 1606  NA 135*  K 3.9  CL 99  CO2 27  GLUCOSE 105*  BUN 9  CREATININE 0.70  CALCIUM 10.7*   PT/INR No results found for this basename: LABPROT, INR,  in the last 72 hours ABG No results found for this basename: PHART, PCO2, PO2, HCO3,  in the last 72 hours  Studies/Results: Ct Abdomen Pelvis Wo Contrast  01/12/2014   CLINICAL DATA:  Left flank pain.  EXAM: CT ABDOMEN AND PELVIS WITHOUT CONTRAST  TECHNIQUE: Multidetector CT imaging of the abdomen and pelvis was performed following the standard protocol without intravenous  contrast.  COMPARISON:  CT 01/16/2013.  FINDINGS: Liver normal . Spleen normal. Pancreas normal. No biliary distention. Cholecystectomy.  Adrenals normal. No focal renal cortical abnormality. Previously identified left hydronephrosis and hydroureter have resolved. There is no evidence of hydronephrosis or hydroureter on either side. Bilateral extrarenal pelves are noted. Nonobstructive nephrolithiasis with the largest stone measuring 12 mm . Bladder is nondistended. Phleboliths. Tiny 2 -3 mm calcific density left lower pelvis is most likely a phlebolith. Nonobstructing distal ureteral stone cannot be excluded. Uterus is unremarkable. Prior tubal ligation.  No significant adenopathy. Shotty inguinal and retroperitoneal lymph nodes. Aorta normal caliber.  Appendix normal. No inflammatory change in right or left lower quadrant. No bowel distention. No free air. No mesenteric lesions. For  Heart size normal. Lung bases are clear. No acute bony abnormality.  IMPRESSION: 1. Previously identified left hydronephrosis and hydroureter have resolved. Nonobstructing left nephrolithiasis is present with the largest stone measuring 12 mm. There is no hydronephrosis or hydroureter. Phleboliths are present. Nonobstructing distal left ureteral stone cannot be entirely excluded, however tiny calcific density noted in the lower pelvis is most likely a phlebolith. 2. Cholecystectomy.   Electronically Signed   By: Marcello Moores  Register   On: 01/12/2014 21:27    Anti-infectives: Anti-infectives   Start     Dose/Rate Route Frequency Ordered Stop   01/12/14 2230  cefTRIAXone (ROCEPHIN) 1 g in dextrose 5 % 50 mL IVPB     1 g 100 mL/hr over 30 Minutes Intravenous  Once 01/12/14 2229        Current Facility-Administered Medications  Medication Dose Route Frequency Provider Last Rate Last Dose  . cefTRIAXone (ROCEPHIN) 1 g in dextrose 5 % 50 mL IVPB  1 g Intravenous Once Stephani Police Piepenbrink, PA-C       Current Outpatient  Prescriptions  Medication Sig Dispense Refill  . albuterol (PROVENTIL HFA;VENTOLIN HFA) 108 (90 BASE) MCG/ACT inhaler Inhale 2 puffs into the lungs every 6 (six) hours as needed. Shortness of breath      . HYDROcodone-acetaminophen (NORCO) 10-325 MG per tablet Take 1 tablet by mouth every 6 (six) hours as needed for pain (pain).      Marland Kitchen lisinopril (PRINIVIL,ZESTRIL) 10 MG tablet Take 10 mg by mouth daily.      Marland Kitchen LORazepam (ATIVAN) 1 MG tablet Take 1 mg by mouth every 6 (six) hours as needed for anxiety (anxiety).        Assessment: She has a 40mm LMP renal stone and left flank pain and irritative voiding symptoms without  hydro.  A small left distal stone can't be ruled out.   Plan: I discussed the options and at this time we are going to proceed with cystoscopy and left ureteral stenting which should take care of a small distal stone but also prepare the ureter for a later ureteroscopy with laser treatment of the large radiolucent renal stone.    I reviewed the risks of bleeding, infection, ureteral injury, stent irritation, need for secondary procedures, thrombotic events and anesthetic complications.    CC: Dr. Cyril Mourning Ward.     LOS: 0 days    Temperance Kelemen J 01/12/2014

## 2014-01-20 NOTE — Anesthesia Preprocedure Evaluation (Signed)
Anesthesia Evaluation  Patient identified by MRN, date of birth, ID band Patient awake    Reviewed: Allergy & Precautions, H&P , NPO status , Patient's Chart, lab work & pertinent test results  Airway Mallampati: II TM Distance: <3 FB Neck ROM: Full    Dental no notable dental hx. (+) Dental Advisory Given   Pulmonary shortness of breath, with exertion and lying, PE breath sounds clear to auscultation  + decreased breath sounds      Cardiovascular hypertension, Pt. on medications Rhythm:Regular Rate:Normal  H/O PE   Neuro/Psych negative neurological ROS  negative psych ROS   GI/Hepatic negative GI ROS, Neg liver ROS,   Endo/Other  Morbid obesity  Renal/GU negative Renal ROS  negative genitourinary   Musculoskeletal negative musculoskeletal ROS (+)   Abdominal   Peds negative pediatric ROS (+)  Hematology negative hematology ROS (+)   Anesthesia Other Findings   Reproductive/Obstetrics negative OB ROS                           Anesthesia Physical  Anesthesia Plan  ASA: III and emergent  Anesthesia Plan: General   Post-op Pain Management:    Induction: Intravenous  Airway Management Planned: LMA  Additional Equipment:   Intra-op Plan:   Post-operative Plan: Extubation in OR  Informed Consent: I have reviewed the patients History and Physical, chart, labs and discussed the procedure including the risks, benefits and alternatives for the proposed anesthesia with the patient or authorized representative who has indicated his/her understanding and acceptance.   Dental advisory given  Plan Discussed with: CRNA and Surgeon  Anesthesia Plan Comments:         Anesthesia Quick Evaluation

## 2014-01-20 NOTE — Progress Notes (Signed)
Post op Short Stay Phase 2 . Pt states her pain is like it was when she came in on left flank and groin. Voided x2 light pink States she has been taking the OTC pyridium (Azo) at home for this pain with little relief. Spoke with Dr Jeffie Pollock about this and Flomax and Levsin ordered for here in Short Stay and a prescription has been called into her home pharmacy

## 2014-01-20 NOTE — Anesthesia Postprocedure Evaluation (Signed)
  Anesthesia Post-op Note  Patient: Carol Ortega  Procedure(s) Performed: Procedure(s) (LRB): LEFT URETEROSCOPY WITH STENT PLACEMENT (Left) HOLMIUM LASER APPLICATION (Left)  Patient Location: PACU  Anesthesia Type: General  Level of Consciousness: awake and alert   Airway and Oxygen Therapy: Patient Spontanous Breathing  Post-op Pain: mild  Post-op Assessment: Post-op Vital signs reviewed, Patient's Cardiovascular Status Stable, Respiratory Function Stable, Patent Airway and No signs of Nausea or vomiting  Last Vitals:  Filed Vitals:   01/20/14 1240  BP:   Pulse: 92  Temp:   Resp: 21    Post-op Vital Signs: stable   Complications: No apparent anesthesia complications

## 2014-01-20 NOTE — Discharge Instructions (Signed)

## 2014-01-20 NOTE — Brief Op Note (Signed)
01/20/2014  11:53 AM  PATIENT:  Carol Ortega  43 y.o. female  PRE-OPERATIVE DIAGNOSIS:  LEFT RENAL STONE  POST-OPERATIVE DIAGNOSIS:  LEFT RENAL STONE  PROCEDURE:  Procedure(s): LEFT URETEROSCOPY WITH STENT PLACEMENT (Left) HOLMIUM LASER APPLICATION (Left)  SURGEON:  Surgeon(s) and Role:    * Irine Seal, MD - Primary  PHYSICIAN ASSISTANT:   ASSISTANTS: none   ANESTHESIA:   general  EBL:  Total I/O In: 1000 [I.V.:1000] Out: -   BLOOD ADMINISTERED:none  DRAINS: 6 x 24 left JJ stent   LOCAL MEDICATIONS USED:  NONE  SPECIMEN:  Source of Specimen:  stone fragments  DISPOSITION OF SPECIMEN:  To office for analysis  COUNTS:  YES  TOURNIQUET:  * No tourniquets in log *  DICTATION: .Other Dictation: Dictation Number [id number]  PLAN OF CARE: Discharge to home after PACU  PATIENT DISPOSITION:  PACU - hemodynamically stable.   Delay start of Pharmacological VTE agent (>24hrs) due to surgical blood loss or risk of bleeding: not applicable

## 2014-01-20 NOTE — Progress Notes (Signed)
Pst Op PHase 2 Short Stay. Ambulated to BR with minimal assist and voided moderate amt of light pink urine. Ambulated back to room with some assist and states her left flank /groin pain is 8/10 and headache is 8/10 . Back in bed with hob 45 degrees an cool cloth to head  Pt is dozing at intervals.

## 2014-01-21 ENCOUNTER — Encounter (HOSPITAL_COMMUNITY): Payer: Self-pay | Admitting: Urology

## 2014-01-21 NOTE — Op Note (Deleted)
Carol Ortega, Carol Ortega            ACCOUNT NO.:  [id number]  MEDICAL RECORD NO.:  [id number]  LOCATION:  PADM                         FACILITY:  [facility]  PHYSICIAN:  Marshall Cork. Jeffie Pollock, M.D.    DATE OF BIRTH:  11/16/1971  DATE OF PROCEDURE:  01/20/2014 DATE OF DISCHARGE:  01/20/2014                              OPERATIVE REPORT   PROCEDURE:  Cystoscopy with removal of left double-J stent, left ureteroscopic stone extraction with holmium lasertripsy, and replacement of double-J stent.  PREOPERATIVE DIAGNOSIS:  A 12 mm radiolucent left proximal stone.  POSTOPERATIVE DIAGNOSIS:  A 12 mm radiolucent left proximal stone.  SURGEON:  Marshall Cork. Jeffie Pollock, M.D.  ANESTHESIA:  General.  SPECIMEN:  Stone fragments.  DRAINS:  A 6-French 24-cm double-J stent.  COMPLICATIONS:  None.  INDICATIONS:  Carol Ortega is a 43 year old African American female with recurrent urolithiasis who underwent stenting approximately a week ago for a 12 mm left proximal ureteral stone with associated infection.  She had been given Rocephin in the emergency room and started on Cipro. Culture grew an E. coli.  She was also given gentamicin today.  FINDINGS OF PROCEDURE:  She was taken to the operating room, received Cipro and gentamicin.  General anesthetic was induced.  She was placed in lithotomy position and fitted with PAS hose.  Perineum and genitalia were prepped with Betadine solution.  She was draped in usual sterile fashion.  Cystoscopy was performed using a 22-French scope and 12-degree lens.  The previously placed stent was grasped and removed through the urethral orifice.  There was mild encrustation.  A wire was passed to the kidney through the stent, and the stent was removed.  A 35 cm access sheath, inner core was used to dilate the ureter.  The access sheath which is a 12 to 21 [place] was reassembled and reinserted to the kidney, and the inner core and wire were removed.  A Storz digital flexible  ureteroscope was then passed through the access sheath to the kidney.  There were some inflammatory changes in the renal pelvic mucosa from the stone which had actually gone back into the mid pole calix.  It was very fragile in appearance, most consistent with a struvite-type stone.  The stone was engaged with a 200 micron laser fiber, set on 0.5 watts and 50 hertz.  The stone fragmented readily into small almost dust-like fragments.  Nitinol basket was used to remove several clusters of fragments and the largest of the residual fragments.  Once all retrievable fragments were removed, the collecting system was irrigated copiously with the scope to flush further fragments from the kidney and at the end, only a small amount of dust and minor fragments remained.  At this point, a guidewire was readvanced through the sheath to the kidney.  Initially, there was some resistance, so I reinserted the scope and advanced the wire through the scope under direct vision into the kidney.  The scope was removed by the access sheath.  There was some bleeding through the sheath as it was removed likely due to guidewire false passing the mucosa.  However once the wire was in good position, the cystoscope was reinserted over the wire and  a 6-French 24-cm double-J stent was inserted without difficulty to the kidney.  Inspection in the bladder during stent placement revealed some bleeding along the stent through the lumen, but it appeared to be abating.  The bladder was then drained. The patient was taken down from lithotomy position.  Her anesthetic was reversed.  She was moved to recovery room in stable condition.  There were no complications.  She will be sent home on Levaquin.     Marshall Cork. Jeffie Pollock, M.D.     JJW/MEDQ  D:  01/20/2014  T:  01/21/2014  Job:  [id number]

## 2014-01-21 NOTE — Op Note (Signed)
NAMERYVER, ZADROZNY            ACCOUNT NO.:  [id number]  MEDICAL RECORD NO.:  [id number]  LOCATION:  PADM                         FACILITY:  [facility]  PHYSICIAN:  Marshall Cork. Jeffie Pollock, M.D.    DATE OF BIRTH:  11/16/1971  DATE OF PROCEDURE:  01/20/2014 DATE OF DISCHARGE:  01/20/2014                              OPERATIVE REPORT   PROCEDURE:  Cystoscopy with removal of left double-J stent, left ureteroscopic stone extraction with holmium lasertripsy, and replacement of double-J stent.  PREOPERATIVE DIAGNOSIS:  A 12 mm radiolucent left proximal stone.  POSTOPERATIVE DIAGNOSIS:  A 12 mm radiolucent left proximal stone.  SURGEON:  Marshall Cork. Jeffie Pollock, M.D.  ANESTHESIA:  General.  SPECIMEN:  Stone fragments.  DRAINS:  A 6-French 24-cm double-J stent.  COMPLICATIONS:  None.  INDICATIONS:  [name] is a 43 year old African American female with recurrent urolithiasis who underwent stenting approximately a week ago for a 12 mm left proximal ureteral stone with associated infection.  She had been given Rocephin in the emergency room and started on Cipro. Culture grew an E. coli.  She was also given gentamicin today.  FINDINGS OF PROCEDURE:  She was taken to the operating room, received Cipro and gentamicin.  General anesthetic was induced.  She was placed in lithotomy position and fitted with PAS hose.  Perineum and genitalia were prepped with Betadine solution.  She was draped in usual sterile fashion.  Cystoscopy was performed using a 22-French scope and 12-degree lens.  The previously placed stent was grasped and removed through the urethral orifice.  There was mild encrustation.  A wire was passed to the kidney through the stent, and the stent was removed.  A 35 cm access sheath, inner core was used to dilate the ureter.  The access sheath which is a 12 to 21 [place] was reassembled and reinserted to the kidney, and the inner core and wire were removed.  A Storz digital flexible  ureteroscope was then passed through the access sheath to the kidney.  There were some inflammatory changes in the renal pelvic mucosa from the stone which had actually gone back into the mid pole calix.  It was very fragile in appearance, most consistent with a struvite-type stone.  The stone was engaged with a 200 micron laser fiber, set on 0.5 watts and 50 hertz.  The stone fragmented readily into small almost dust-like fragments.  Nitinol basket was used to remove several clusters of fragments and the largest of the residual fragments.  Once all retrievable fragments were removed, the collecting system was irrigated copiously with the scope to flush further fragments from the kidney and at the end, only a small amount of dust and minor fragments remained.  At this point, a guidewire was readvanced through the sheath to the kidney.  Initially, there was some resistance, so I reinserted the scope and advanced the wire through the scope under direct vision into the kidney.  The scope was removed by the access sheath.  There was some bleeding through the sheath as it was removed likely due to guidewire false passing the mucosa.  However once the wire was in good position, the cystoscope was reinserted over the wire and  a 6-French 24-cm double-J stent was inserted without difficulty to the kidney.  Inspection in the bladder during stent placement revealed some bleeding along the stent through the lumen, but it appeared to be abating.  The bladder was then drained. The patient was taken down from lithotomy position.  Her anesthetic was reversed.  She was moved to recovery room in stable condition.  There were no complications.  She will be sent home on Levaquin.     Kaliegh Willadsen J. Derita Michelsen, M.D.     JJW/MEDQ  D:  01/20/2014  T:  01/21/2014  Job:  [id number] 

## 2014-01-25 ENCOUNTER — Emergency Department (HOSPITAL_COMMUNITY)
Admission: EM | Admit: 2014-01-25 | Discharge: 2014-01-25 | Disposition: A | Discharge status: Home / Self Care | Payer: Medicaid Other | Attending: Emergency Medicine | Admitting: Emergency Medicine

## 2014-01-25 ENCOUNTER — Encounter (HOSPITAL_COMMUNITY): Payer: Self-pay | Admitting: Emergency Medicine

## 2014-01-25 ENCOUNTER — Emergency Department (HOSPITAL_COMMUNITY): Payer: Medicaid Other

## 2014-01-25 DIAGNOSIS — N200 Calculus of kidney: Secondary | ICD-10-CM

## 2014-01-25 DIAGNOSIS — Z880 Allergy status to penicillin: Secondary | ICD-10-CM | POA: Insufficient documentation

## 2014-01-25 DIAGNOSIS — Z792 Long term (current) use of antibiotics: Secondary | ICD-10-CM | POA: Insufficient documentation

## 2014-01-25 DIAGNOSIS — R109 Unspecified abdominal pain: Secondary | ICD-10-CM

## 2014-01-25 DIAGNOSIS — K5900 Constipation, unspecified: Secondary | ICD-10-CM | POA: Insufficient documentation

## 2014-01-25 DIAGNOSIS — Z91040 Latex allergy status: Secondary | ICD-10-CM | POA: Insufficient documentation

## 2014-01-25 DIAGNOSIS — Z9889 Other specified postprocedural states: Secondary | ICD-10-CM | POA: Insufficient documentation

## 2014-01-25 DIAGNOSIS — Z79899 Other long term (current) drug therapy: Secondary | ICD-10-CM | POA: Insufficient documentation

## 2014-01-25 DIAGNOSIS — Z8709 Personal history of other diseases of the respiratory system: Secondary | ICD-10-CM | POA: Insufficient documentation

## 2014-01-25 DIAGNOSIS — Z87440 Personal history of urinary (tract) infections: Secondary | ICD-10-CM | POA: Insufficient documentation

## 2014-01-25 DIAGNOSIS — I10 Essential (primary) hypertension: Secondary | ICD-10-CM | POA: Insufficient documentation

## 2014-01-25 DIAGNOSIS — Z86011 Personal history of benign neoplasm of the brain: Secondary | ICD-10-CM | POA: Insufficient documentation

## 2014-01-25 DIAGNOSIS — E669 Obesity, unspecified: Secondary | ICD-10-CM | POA: Insufficient documentation

## 2014-01-25 DIAGNOSIS — R112 Nausea with vomiting, unspecified: Secondary | ICD-10-CM | POA: Insufficient documentation

## 2014-01-25 DIAGNOSIS — Z86711 Personal history of pulmonary embolism: Secondary | ICD-10-CM | POA: Insufficient documentation

## 2014-01-25 DIAGNOSIS — Z923 Personal history of irradiation: Secondary | ICD-10-CM | POA: Insufficient documentation

## 2014-01-25 LAB — URINE MICROSCOPIC-ADD ON

## 2014-01-25 LAB — POCT I-STAT, CHEM 8
BUN: 8 mg/dL (ref 6–23)
CALCIUM ION: 1.44 mmol/L — AB (ref 1.12–1.23)
Chloride: 104 mEq/L (ref 96–112)
Creatinine, Ser: 0.8 mg/dL (ref 0.50–1.10)
Glucose, Bld: 108 mg/dL — ABNORMAL HIGH (ref 70–99)
HEMATOCRIT: 43 % (ref 36.0–46.0)
Hemoglobin: 14.6 g/dL (ref 12.0–15.0)
Potassium: 4 mEq/L (ref 3.7–5.3)
Sodium: 141 mEq/L (ref 137–147)
TCO2: 27 mmol/L (ref 0–100)

## 2014-01-25 LAB — URINALYSIS, ROUTINE W REFLEX MICROSCOPIC
BILIRUBIN URINE: NEGATIVE
Glucose, UA: NEGATIVE mg/dL
KETONES UR: NEGATIVE mg/dL
Nitrite: NEGATIVE
PH: 6 (ref 5.0–8.0)
Protein, ur: 100 mg/dL — AB
SPECIFIC GRAVITY, URINE: 1.018 (ref 1.005–1.030)
Urobilinogen, UA: 1 mg/dL (ref 0.0–1.0)

## 2014-01-25 LAB — BASIC METABOLIC PANEL
BUN: 8 mg/dL (ref 6–23)
CALCIUM: 10.7 mg/dL — AB (ref 8.4–10.5)
CO2: 26 mEq/L (ref 19–32)
Chloride: 101 mEq/L (ref 96–112)
Creatinine, Ser: 0.74 mg/dL (ref 0.50–1.10)
GFR calc Af Amer: >90 mL/min (ref >90)
GFR calc non Af Amer: >90 mL/min (ref >90)
Glucose, Bld: 121 mg/dL — ABNORMAL HIGH (ref 70–99)
Potassium: 4.3 mEq/L (ref 3.7–5.3)
SODIUM: 138 meq/L (ref 137–147)

## 2014-01-25 LAB — CBC WITH DIFFERENTIAL/PLATELET
Basophils Absolute: 0.1 10*3/uL (ref 0.0–0.1)
Basophils Relative: 1 % (ref 0–1)
EOS ABS: 0.2 10*3/uL (ref 0.0–0.7)
Eosinophils Relative: 3 % (ref 0–5)
HCT: 39.4 % (ref 36.0–46.0)
Hemoglobin: 12.6 g/dL (ref 12.0–15.0)
LYMPHS ABS: 1.8 10*3/uL (ref 0.7–4.0)
Lymphocytes Relative: 25 % (ref 12–46)
MCH: 28 pg (ref 26.0–34.0)
MCHC: 32 g/dL (ref 30.0–36.0)
MCV: 87.6 fL (ref 78.0–100.0)
Monocytes Absolute: 0.7 10*3/uL (ref 0.1–1.0)
Monocytes Relative: 10 % (ref 3–12)
NEUTROS PCT: 61 % (ref 43–77)
Neutro Abs: 4.2 10*3/uL (ref 1.7–7.7)
PLATELETS: 347 10*3/uL (ref 150–400)
RBC: 4.5 MIL/uL (ref 3.87–5.11)
RDW: 14.1 % (ref 11.5–15.5)
WBC: 6.9 10*3/uL (ref 4.0–10.5)

## 2014-01-25 LAB — LIPASE, BLOOD: LIPASE: 20 U/L (ref 11–59)

## 2014-01-25 MED ORDER — SODIUM CHLORIDE 0.9 % IV BOLUS (SEPSIS)
1000.0000 mL | Freq: Once | INTRAVENOUS | Status: AC
  Administered 2014-01-25: 1000 mL via INTRAVENOUS

## 2014-01-25 MED ORDER — BELLADONNA ALKALOIDS-OPIUM 16.2-60 MG RE SUPP: 1.0000 | Freq: Once | RECTAL | Status: DC

## 2014-01-25 MED ORDER — FENTANYL CITRATE 0.05 MG/ML IJ SOLN
100.0000 ug | Freq: Once | INTRAMUSCULAR | Status: DC
  Filled 2014-01-25: qty 2

## 2014-01-25 MED ORDER — ONDANSETRON HCL 4 MG PO TABS: 4.0000 mg | ORAL_TABLET | Freq: Four times a day (QID) | ORAL | Status: DC

## 2014-01-25 MED ORDER — HYDROMORPHONE HCL PF 1 MG/ML IJ SOLN
1.0000 mg | Freq: Once | INTRAMUSCULAR | Status: AC
  Administered 2014-01-25: 1 mg via INTRAVENOUS
  Filled 2014-01-25: qty 1

## 2014-01-25 MED ORDER — TROSPIUM CHLORIDE ER 60 MG PO CP24: 1.0000 | ORAL_CAPSULE | Freq: Every day | ORAL | Status: DC

## 2014-01-25 MED ORDER — TROSPIUM CHLORIDE 20 MG PO TABS: 60.0000 mg | ORAL_TABLET | Freq: Two times a day (BID) | ORAL | Status: DC

## 2014-01-25 MED ORDER — OXYCODONE-ACETAMINOPHEN 5-325 MG PO TABS
2.0000 | ORAL_TABLET | Freq: Once | ORAL | Status: AC
  Administered 2014-01-25: 2 via ORAL
  Filled 2014-01-25: qty 2

## 2014-01-25 MED ORDER — HYDROMORPHONE HCL PF 1 MG/ML IJ SOLN
1.0000 mg | Freq: Once | INTRAMUSCULAR | Status: AC
Start: 2014-01-25 — End: 2014-01-25
  Administered 2014-01-25: 1 mg via INTRAVENOUS
  Filled 2014-01-25: qty 1

## 2014-01-25 MED ORDER — OXYCODONE-ACETAMINOPHEN 5-325 MG PO TABS: 1.0000 | ORAL_TABLET | Freq: Four times a day (QID) | ORAL | Status: DC | PRN

## 2014-01-25 MED ORDER — ONDANSETRON HCL 4 MG/2ML IJ SOLN
4.0000 mg | Freq: Once | INTRAMUSCULAR | Status: AC
  Administered 2014-01-25: 4 mg via INTRAVENOUS
  Filled 2014-01-25: qty 2

## 2014-01-25 NOTE — Discharge Instructions (Signed)
Ureteral Stent Implantation, Care After Refer to this sheet in the next few weeks. These instructions provide you with information on caring for yourself after your procedure. Your health care provider may also give you more specific instructions. Your treatment has been planned according to current medical practices, but problems sometimes occur. Call your health care provider if you have any problems or questions after your procedure. WHAT TO EXPECT AFTER THE PROCEDURE You should be back to normal activity within 48 hours after the procedure. Nausea and vomiting may occur and are commonly the result of anesthesia. It is common to experience sharp pain in the back or lower abdomen and penis with voiding. This is caused by movement of the ends of the stent with the act of urinating.It usually goes away within minutes after you have stopped urinating. HOME CARE INSTRUCTIONS Make sure to drink plenty of fluids. You may have small amounts of bleeding, causing your urine to be red. This is normal. Certain movements may trigger pain or a feeling that you need to urinate. You may be given medications to prevent infection or bladder spasms. Be sure to take all medications as directed. Only take over-the-counter or prescription medicines for pain, discomfort, or fever as directed by your health care provider. Do not take aspirin, as this can make bleeding worse. Your stent will be left in until the blockage is resolved. This may take 2 weeks or longer, depending on the reason for stent implantation. You may have an X-ray exam to make sure your ureter is open and that the stent has not moved out of position (migrated). The stent can be removed by your health care provider in the office. Medications may be given for comfort while the stent is being removed. Be sure to keep all follow-up appointments so your health care provider can check that you are healing properly. SEEK MEDICAL CARE IF:  You experience  increasing pain.  Your pain medication is not working. SEEK IMMEDIATE MEDICAL CARE IF:  Your urine is dark red or has blood clots.  You are leaking urine (incontinent).  You have a fever, chills, feeling sick to your stomach (nausea), or vomiting.  Your pain is not relieved by pain medication.  The end of the stent comes out of the urethra.  You are unable to urinate. Document Released: 08/06/2013 Document Reviewed: 08/06/2013 [facility] Patient Information 2014 [place]. Ureteral Colic Ureteral colic is spasm-like pain from the kidney or the ureter. This is often caused by a kidney stone. The pain is caused by the stone trying to get through the tubes that pass your pee. HOME CARE   Drink enough fluids to keep your pee (urine) clear or pale yellow.  Strain all your pee. A strainer will be provided. Keep anything caught in the Rudy and bring it to your doctor. The stone causing the pain may be very small.  Only take medicine as told by your doctor.  Follow up with your doctor as told. GET HELP RIGHT AWAY IF:   Pain is not controlled with medicine.  Pain continues or gets worse.  The pain changes and there is chest or belly (abdominal) pain.  You pass out (faint).  You cannot pee.  You keep throwing up (vomiting).  You have a temperature by mouth above 102 F (38.9 C), not controlled by medicine. MAKE SURE YOU:   Understand these instructions.  Will watch this condition.  Will get help right away if you are not doing well or  get worse. Document Released: 05/22/2008 Document Revised: 02/26/2012 Document Reviewed: 05/22/2008 [facility] Patient Information 2014 [place], [place].

## 2014-01-25 NOTE — ED Notes (Signed)
Patient states that she has had both lithotripsy and a JJ stent placement. She is now passing clots and hurting

## 2014-01-25 NOTE — ED Provider Notes (Signed)
CSN: [id number]     Arrival date & time 01/25/14  1250 History   First MD Initiated Contact with Patient 01/25/14 1500     Chief Complaint  Patient presents with  . Abdominal Pain  . Flank Pain   (Consider location/radiation/quality/duration/timing/severity/associated sxs/prior Treatment) Patient is a 43 y.o. female presenting with abdominal pain and flank pain. The history is provided by the patient and the spouse. No language interpreter was used.  Abdominal Pain Pain location:  L flank, LLQ and suprapubic Pain quality: sharp   Pain severity:  Severe Duration:  1 day Timing:  Intermittent Progression:  Waxing and waning Chronicity:  Recurrent Relieved by:  Nothing Worsened by:  Nothing tried Ineffective treatments: norco. Associated symptoms: constipation, hematuria, nausea and vomiting   Associated symptoms: no chest pain, no chills, no cough, no diarrhea, no dysuria, no fatigue, no fever, no shortness of breath and no sore throat   Vomiting:    Quality:  Stomach contents   Number of occurrences:  1   Severity:  Mild Risk factors comment:  Known 32mm stone diagnosed 1/26, with second ureteral stent placed 2/3 Flank Pain Associated symptoms include abdominal pain. Pertinent negatives include no chest pain, no headaches and no shortness of breath.    Past Medical History  Diagnosis Date  . Kidney stone   . UTI (urinary tract infection)   . Obesity   . Brain tumor   . Radiation     Brain tumor  . Hypertension   . Pulmonary emboli 2011  . Complication of anesthesia 1999    lung collapse after surgery for gallbladder   Past Surgical History  Procedure Laterality Date  . Kidney stone removal    . Lithotripsy    . Cystoscopy w/ ureteroscopy    . Cystoscopy with stent placement Left 01/12/2014    Procedure: CYSTOSCOPY WITH STENT PLACEMENT left retrograde;  Surgeon: Bjorn Pippin, MD;  Location: [facility];  Service: Urology;  Laterality: Left;  . Surgery for,ectopic pregnancy   2011     1 fallopian tube rupture  . Tubal ligation  2011  . Cholecystectomy  1999  . Cystoscopy with retrograde pyelogram, ureteroscopy and stent placement Left 01/20/2014    Procedure: LEFT URETEROSCOPY WITH STENT PLACEMENT;  Surgeon: Bjorn Pippin, MD;  Location: [facility];  Service: Urology;  Laterality: Left;  . Holmium laser application Left 01/20/2014    Procedure: HOLMIUM LASER APPLICATION;  Surgeon: Bjorn Pippin, MD;  Location: [facility];  Service: Urology;  Laterality: Left;   History reviewed. No pertinent family history. History  Substance Use Topics  . Smoking status: Never Smoker   . Smokeless tobacco: Never Used  . Alcohol Use: Yes     Comment: social only   OB History   Grav Para Term Preterm Abortions TAB SAB Ect Mult Living                 Review of Systems  Constitutional: Negative for fever, chills, diaphoresis, activity change, appetite change and fatigue.  HENT: Negative for congestion, facial swelling, rhinorrhea and sore throat.   Eyes: Negative for photophobia and discharge.  Respiratory: Negative for cough, chest tightness and shortness of breath.   Cardiovascular: Negative for chest pain, palpitations and leg swelling.  Gastrointestinal: Positive for nausea, vomiting, abdominal pain and constipation. Negative for diarrhea.  Endocrine: Negative for polydipsia and polyuria.  Genitourinary: Positive for hematuria and flank pain. Negative for dysuria, frequency, difficulty urinating and pelvic pain.  Musculoskeletal: Negative for arthralgias, back  pain, neck pain and neck stiffness.  Skin: Negative for color change and wound.  Allergic/Immunologic: Negative for immunocompromised state.  Neurological: Negative for facial asymmetry, weakness, numbness and headaches.  Hematological: Does not bruise/bleed easily.  Psychiatric/Behavioral: Negative for confusion and agitation.    Allergies  Eggs or egg-derived products; Penicillins; and Latex  Home Medications   Current  Outpatient Rx  Name  Route  Sig  Dispense  Refill  . albuterol (PROVENTIL HFA;VENTOLIN HFA) 108 (90 BASE) MCG/ACT inhaler   Inhalation   Inhale 2 puffs into the lungs every 6 (six) hours as needed. Shortness of breath         . HYDROcodone-acetaminophen (NORCO) 10-325 MG per tablet   Oral   Take 1 tablet by mouth every 6 (six) hours as needed for pain (pain).         Marland Kitchen levofloxacin (LEVAQUIN) 500 MG tablet   Oral   Take 1 tablet (500 mg total) by mouth daily.   7 tablet   0   . lisinopril-hydrochlorothiazide (PRINZIDE,ZESTORETIC) 20-12.5 MG per tablet   Oral   Take 1 tablet by mouth every evening.          Marland Kitchen LORazepam (ATIVAN) 1 MG tablet   Oral   Take 1 mg by mouth every 6 (six) hours as needed for anxiety (anxiety).         . naproxen sodium (ANAPROX) 220 MG tablet   Oral   Take 440 mg by mouth 2 (two) times daily as needed (pain).         . phenazopyridine (PYRIDIUM) 200 MG tablet   Oral   Take 200 mg by mouth 3 (three) times daily as needed for pain.         Marland Kitchen ondansetron (ZOFRAN) 4 MG tablet   Oral   Take 1 tablet (4 mg total) by mouth every 6 (six) hours.   20 tablet   0   . oxyCODONE-acetaminophen (PERCOCET) 5-325 MG per tablet   Oral   Take 1-2 tablets by mouth every 6 (six) hours as needed.   20 tablet   0   . Trospium Chloride 60 MG CP24   Oral   Take 1 capsule (60 mg total) by mouth daily.   30 each   0    BP 145/51  Pulse 93  Temp(Src) 98.3 F (36.8 C) (Oral)  Resp 15  SpO2 99%  LMP 01/20/2014 Physical Exam  Constitutional: She is oriented to person, place, and time. She appears well-developed and well-nourished. No distress.  HENT:  Head: Normocephalic and atraumatic.  Mouth/Throat: No oropharyngeal exudate.  Eyes: Pupils are equal, round, and reactive to light.  Neck: Normal range of motion. Neck supple.  Cardiovascular: Normal rate, regular rhythm and normal heart sounds.  Exam reveals no gallop and no friction rub.   No  murmur heard. Pulmonary/Chest: Effort normal and breath sounds normal. No respiratory distress. She has no wheezes. She has no rales.  Abdominal: Soft. Bowel sounds are normal. She exhibits no distension and no mass. There is tenderness in the suprapubic area and left lower quadrant. There is CVA tenderness (left). There is no rebound and no guarding.  Musculoskeletal: Normal range of motion. She exhibits no edema and no tenderness.  Neurological: She is alert and oriented to person, place, and time.  Skin: Skin is warm and dry.  Psychiatric: She has a normal mood and affect.    ED Course  Procedures (including critical care time) Labs Review Labs  Reviewed  BASIC METABOLIC PANEL - Abnormal; Notable for the following:    Glucose, Bld 121 (*)    Calcium 10.7 (*)    All other components within normal limits  URINALYSIS, ROUTINE W REFLEX MICROSCOPIC - Abnormal; Notable for the following:    Color, Urine AMBER (*)    APPearance TURBID (*)    Hgb urine dipstick LARGE (*)    Protein, ur 100 (*)    Leukocytes, UA LARGE (*)    All other components within normal limits  URINE MICROSCOPIC-ADD ON - Abnormal; Notable for the following:    Squamous Epithelial / LPF FEW (*)    All other components within normal limits  POCT I-STAT, CHEM 8 - Abnormal; Notable for the following:    Glucose, Bld 108 (*)    Calcium, Ion 1.44 (*)    All other components within normal limits  URINE CULTURE  CBC WITH DIFFERENTIAL  LIPASE, BLOOD   Imaging Review Dg Abd 1 View  01/25/2014   CLINICAL DATA:  Abdominal pain and hematuria  EXAM: ABDOMEN - 1 VIEW  COMPARISON:  CT abdomen and pelvis January 12, 2014  FINDINGS: There is a double-J stent extending from the level of L1 to the bladder. Bowel gas pattern is unremarkable. There is no appreciable obstruction or free air on this supine examination. There is stool throughout the colon diffusely. There is a phlebolith in the lower left pelvis. No other abnormal  calcifications are identified. There are surgical clips in the gallbladder fossa region.  IMPRESSION: Double-J stent on left. Large amount of stool. Bowel gas pattern unremarkable on this supine examination.   Electronically Signed   By: Lowella Grip M.D.   On: 01/25/2014 16:33    EKG Interpretation   None       MDM   1. Flank pain   2. Nephrolithiasis, uric acid    Pt is a 43 y.o. female with Pmhx as above who presents with increased L flank/abdominal pain since this morning, passage of small blood clots in urine. Pt was diagnosed w/ 169m stone 1/26, had second ureteral stent placed 2/3 by Dr. Idamae Schuller.  Denies fever.  +constipation, +nausea, vomited x1 today.  On PE, VSS.  Pt uncomfortable, but in NAD.  +L CVA tenderness & LLQ/suprapubic ttp w/o rebound or guarding. W/u shows stable Cr.  Urine w/ leukocytes which is likely reactive. WBC nml.  AAS w/ stent in place.  I believe she is having stent related spasm. Spoke to urology at pt's insistence as she was hopin to have stent removed tonight.  Urology recommended Rx for trospium for spasm. We agreed that emergent stent removal not appropriate for tonight.  She can f/u with uro as scheduled in 2 days.  Return precautions given for new or worsening symptoms including fever, inability to tolerate PO. Narcotics switched to percocet.    Neta Ehlers, MD 01/26/14 [phone]

## 2014-01-26 LAB — URINE CULTURE

## 2014-01-30 DIAGNOSIS — E785 Hyperlipidemia, unspecified: Secondary | ICD-10-CM | POA: Insufficient documentation

## 2014-01-30 DIAGNOSIS — I10 Essential (primary) hypertension: Secondary | ICD-10-CM | POA: Insufficient documentation

## 2014-02-09 ENCOUNTER — Encounter: Payer: Self-pay | Admitting: Internal Medicine

## 2014-02-09 ENCOUNTER — Encounter (INDEPENDENT_AMBULATORY_CARE_PROVIDER_SITE_OTHER): Payer: Self-pay

## 2014-02-09 ENCOUNTER — Ambulatory Visit (INDEPENDENT_AMBULATORY_CARE_PROVIDER_SITE_OTHER): Payer: Medicaid Other | Admitting: Internal Medicine

## 2014-02-09 DIAGNOSIS — E559 Vitamin D deficiency, unspecified: Secondary | ICD-10-CM

## 2014-02-09 DIAGNOSIS — E213 Hyperparathyroidism, unspecified: Secondary | ICD-10-CM

## 2014-02-09 LAB — PHOSPHORUS: PHOSPHORUS: 2.9 mg/dL (ref 2.3–4.6)

## 2014-02-09 NOTE — Progress Notes (Addendum)
Patient ID: Carol Ortega, female   DOB: [birth year], 43 y.o.   MRN: [id number]   HPI  Carol Ortega is a 43 y.o.-year-old female, referred by Dr Tresa Moore (her urologist), for evaluation for hypercalcemia. PCP: Dr Eldridge Abrahams.  Pt was referred for hypercalcemia during investigation of nephrolithiasis. She tells me she was found to have a high calcium level in the past by PCP.   She has had L nephrolithiasis 2x a year since 2000 >> many visits to the ED. She has had lithotripsy and then extractions. Some of the stones she passed (12 mm). She has had a stent placed, now taken out.   I reviewed pt's pertinent labs: Lab Results  Component Value Date   CALCIUM 10.7* 01/25/2014   CALCIUM 10.7* 01/12/2014   CALCIUM 9.8 12/21/2013   CALCIUM 10.3 01/16/2013   CALCIUM 10.9* 10/01/2012   CALCIUM 10.7* 07/20/2012   CALCIUM 11.1* 07/23/2011   CALCIUM 10.6* 02/07/2011   CALCIUM 10.1 10/14/2010   She has a h/o vitamin D deficiency few years ago >> started on the high dose Ergocalciferol. No vit D supplements now. She did not have a recent level. Pt is not on calcium and vitamin D; she also eats dairy (not milk as she gets AP)  - not daily; and green, leafy, vegetables.   No h/o osteoporosis or fractures.  No h/o CKD. Last BUN/Cr: Lab Results  Component Value Date   BUN 8 01/25/2014   CREATININE 0.80 01/25/2014   She is very constipated << on Norco.   Pt does not have a FH of hypercalcemia, pituitary tumors, thyroid cancer. Has a h/o osteoporosis in grand gradmother.   I reviewed her chart and she also has a history of left sphenoid wing meningioma >> had 32 days of radiation tx. She also has a h/o HTN and was on Lisinopril-HCTZ, but not recently. She recently restarted Lisinopril only (2 days ago).   ROS: Constitutional: + weight gain, + fatigue, + subjective hyperthermia, + poor sleep Eyes: + blurry vision, no xerophthalmia ENT: + sore throat, no nodules palpated in throat, no dysphagia/odynophagia,+  hoarseness Cardiovascular: no CP/SOB/palpitations/leg swelling Respiratory: no cough/SOB Gastrointestinal: + all: N/V/C/heartburn Musculoskeletal: + muscle/+ joint aches Skin: no rashes Neurological: no tremors/numbness/tingling/dizziness, + HA Psychiatric: no depression/+ anxiety  Past Medical History  Diagnosis Date  . Kidney stone   . UTI (urinary tract infection)   . Obesity   . Brain tumor   . Radiation     Brain tumor  . Hypertension   . Pulmonary emboli 2011  . Complication of anesthesia 1999    lung collapse after surgery for gallbladder   Past Surgical History  Procedure Laterality Date  . Kidney stone removal    . Lithotripsy    . Cystoscopy w/ ureteroscopy    . Cystoscopy with stent placement Left 01/12/2014    Procedure: CYSTOSCOPY WITH STENT PLACEMENT left retrograde;  Surgeon: Irine Seal, MD;  Location: [facility];  Service: Urology;  Laterality: Left;  . Surgery for,ectopic pregnancy  2011     1 fallopian tube rupture  . Tubal ligation  2011  . Cholecystectomy  1999  . Cystoscopy with retrograde pyelogram, ureteroscopy and stent placement Left 01/20/2014    Procedure: LEFT URETEROSCOPY WITH STENT PLACEMENT;  Surgeon: Irine Seal, MD;  Location: [facility];  Service: Urology;  Laterality: Left;  . Holmium laser application Left 03/25/5461    Procedure: HOLMIUM LASER APPLICATION;  Surgeon: Irine Seal, MD;  Location: [facility];  Service: Urology;  Laterality: Left;   History   Social History  . Marital Status: Single    Spouse Name: N/A    Number of Children: 4   Occupational History  . unemployed   Social History Main Topics  . Smoking status: Never Smoker   . Smokeless tobacco: Never Used  . Alcohol Use: Yes     Comment: social only  . Drug Use: No   Current Outpatient Prescriptions on File Prior to Visit  Medication Sig Dispense Refill  . albuterol (PROVENTIL HFA;VENTOLIN HFA) 108 (90 BASE) MCG/ACT inhaler Inhale 2 puffs into the lungs every 6 (six) hours as  needed. Shortness of breath      . lisinopril-hydrochlorothiazide (PRINZIDE,ZESTORETIC) 20-12.5 MG per tablet Take 1 tablet by mouth every evening.       . ondansetron (ZOFRAN) 4 MG tablet Take 1 tablet (4 mg total) by mouth every 6 (six) hours.  20 tablet  0  . HYDROcodone-acetaminophen (NORCO) 10-325 MG per tablet Take 1 tablet by mouth every 6 (six) hours as needed for pain (pain).      . LORazepam (ATIVAN) 1 MG tablet Take 1 mg by mouth every 6 (six) hours as needed for anxiety (anxiety).      . naproxen sodium (ANAPROX) 220 MG tablet Take 440 mg by mouth 2 (two) times daily as needed (pain).      . Trospium Chloride 60 MG CP24 Take 1 capsule (60 mg total) by mouth daily.  30 each  0   No current facility-administered medications on file prior to visit.   Allergies  Allergen Reactions  . Eggs Or Egg-Derived Products Anaphylaxis  . Penicillins Hives  . Latex Rash  . Penicillin G Rash   History reviewed. No pertinent family history.   PE: BP 132/86  Pulse 83  Temp(Src) 97.9 F (36.6 C) (Oral)  Resp 12  Ht 5\' 7"  (1.702 m)  Wt 357 lb (161.934 kg)  BMI 55.90 kg/m2  SpO2 95%  LMP 01/01/2014 Wt Readings from Last 3 Encounters:  02/09/14 357 lb (161.934 kg)  01/19/14 356 lb 6.4 oz (161.662 kg)  12/21/13 324 lb (146.965 kg)   Constitutional: overweight, in NAD. No kyphosis. Eyes: PERRLA, EOMI, no exophthalmos ENT: moist mucous membranes, no thyromegaly, no cervical lymphadenopathy Cardiovascular: RRR, No MRG Respiratory: CTA B Gastrointestinal: abdomen soft, NT, ND, BS+ Musculoskeletal: no deformities, strength intact in all 4 Skin: moist, warm, no rashes Neurological: no tremor with outstretched hands, DTR normal in all 4  Assessment: 1. Hypercalcemia/hyperparathyroidism  Plan: Patient has a h/o elevated calcium, with the highest level being at 11.1. No available corresponding intact PTH level available. It is unclear whether she has vitamin D deficiency now (has a h/o  vit d), however, it is possible that she does have a parathyroid adenoma based on the high PTH level with a borderline/high calcium.  She does have complications from hypercalcemia - nephrolithiasis (per problem list in chart, this was with uric acid). N osteoporosis, no fractures. No abdominal pain, depression, bone pain. - I discussed with the patient about the physiology of calcium and parathyroid hormone, and possible side effects from increased PTH, including kidney stones, osteoporosis, abdominal pain, etc.  - we discussed about criteria for parathyroid surgery:  Increased calcium by more than 1 mg/dL above the upper limit of normal  Kidney ds.  Osteoporosis  Age <60 years old - she does meet the criteria based on age. - I will first want to check: calcium  intact PTH Phosphorus  vitamin D 25 HO Vit D 1,25 HO urinary calcium/creatinine ratio  - If the tests indicate a parathyroid adenoma, I will refer her to surgery. - I will see the patient back in 6 months  Component     Latest Ref Rng 02/09/2014  Vitamin D 1, 25 (OH) Total     18 - 72 pg/mL 129 (H)  Vitamin D3 1, 25 (OH)      100  Vitamin D2 1, 25 (OH)      29  PTH     14.0 - 72.0 pg/mL 165.2 (H)  Calcium     8.4 - 10.5 mg/dL 10.4  Vit D, 25-Hydroxy     30 - 89 ng/mL 14 (L)  Phosphorus     2.3 - 4.6 mg/dL 2.9   I called and discussed with pt: Vitamin D very low >> start 2000 IU daily and do urine collection when vit D increased >> I will recheck it in 2 mo  Received records from [facility] (Dr Tresa Moore) - 01/29/2014: Patient with history of: - left nephrolithiasis - underwent SWL 2015 [place], subsequently presenting with refractory colic, and bacteriuria in [place] and managed with a stent 01/13/2014, followed by definitive ureteroscopy for large left UPJ stone - bacteriuria UTI - most recent positive culture with Escherichia coli 12/2013. Most recent urine culture on 01/25/2014, negative. - Investigation for  nephrolithiasis: Uric acid 5.4 (2.4-7) BMP normal, except glucose 102 and calcium 10.7 (8.4-10.5); her GFR was normal at >89. PTH 190.8 (14-72)

## 2014-02-09 NOTE — Patient Instructions (Signed)
Please stop at the lab. Please join MyChart >> I will send you the results through there.  Please collect a 24h urine and bring it back to our lab.  Patient information (Up-to-Date): Collection of a 24-hour urine specimen  - You should collect every drop of urine during each 24-hour period. It does not matter how much or little urine is passed each time, as long as every drop is collected. - Begin the urine collection in the morning after you wake up, after you have emptied your bladder for the first time. - Urinate (empty the bladder) for the first time and flush it down the toilet. Note the exact time (eg, 6:15 AM). You will begin the urine collection at this time. - Collect every drop of urine during the day and night in an empty collection bottle. Store the bottle at room temperature or in the refrigerator. - If you need to have a bowel movement, any urine passed with the bowel movement should be collected. Try not to include feces with the urine collection. If feces does get mixed in, do not try to remove the feces from the urine collection bottle. - Finish by collecting the first urine passed the next morning, adding it to the collection bottle. This should be within ten minutes before or after the time of the first morning void on the first day (which was flushed). In this example, you would try to void between 6:05 and 6:25 on the second day. - If you need to urinate one hour before the final collection time, drink a full glass of water so that you can void again at the appropriate time. If you have to urinate 20 minutes before, try to hold the urine until the proper time. - Please note the exact time of the final collection, even if it is not the same time as when collection began on day 1. - The bottle(s) may be kept at room temperature for a day or two, but should be kept cool or refrigerated for longer periods of time.   Hypercalcemia Hypercalcemia means the calcium in your blood is too  high. Calcium in our blood is important for the control of many things, such as:  Blood clotting.  Conducting of nerve impulses.  Muscle contraction.  Maintaining teeth and bone health.  Other body functions. In the bloodstream, calcium maintains a constant balance with another mineral, phosphate. Calcium is absorbed into the body through the small intestine. This is helped by Vitamin D. Calcium levels are maintained mostly by vitamin D and a hormone (parathyroid hormone). But the kidneys also help. Hypercalcemia can happen when the concentration of calcium is too high for the kidneys to maintain balance. The body maintains a balance between the calcium we eat and the calcium already in our body. If calcium intake is increased or we cannot use calcium properly, there may be problems. Some common sources of calcium are:   Dairy products.  Nuts.  Eggs.  Whole grains.  Legumes.  Green leafy vegetables. CAUSES There are many causes of this condition, but some common ones are:  Hyperparathyroidism. This is an over activity of the parathyroid gland.  Cancers of the breast, kidney, lung, head and neck are common causes of calcium increases.  Medications that cause you to urinate more often (diuretics), nausea, vomiting and diarrhea also increase the calcium in the blood.  Overuse of calcium-containing antacids. SYMPTOMS  Many patients with mild hypercalcemia have no symptoms. For those with symptoms common problems  include:  Loss of appetite.  Constipation.  Increased thirst.  Heart rhythm changes.  Abnormal thinking.  Nausea.  Abdominal pain.  Kidney stones.  Mood swings.  Coma and death when severe.  Vomiting.  Increased urination.  High blood pressure.  Confusion. DIAGNOSIS   Your caregiver will do a medical history and perform a physical exam on you.  Calcium and parathyroid hormone (PTH) may be measured with a blood test. TREATMENT   The treatment  depends on the calcium level and what is causing the higher level. Hypercalcemia can be lifethreatening. Fast lowering of the calcium level may be necessary.  With normal kidney function, fluids can be given by vein to clear the excess calcium. Hemodialysis works well to reduce dangerous calcium levels if there is poor kidney function. This is a procedure in which a machine is used to filter out unwanted substances. The blood is then returned to the body.  Drugs, such as diuretics, can be given after adequate fluid intake is established. These medications help the kidneys get rid of extra calcium. Drugs that lessen (inhibit) bone loss are helpful in gaining long-term control. Phosphate pills help lower high calcium levels caused by a low supply of phosphate. Anti-inflammatory agents such as steroids are helpful with some cancers and toxic levels of vitamin D.  Treatment of the underlying cause of the hypercalcemia will also correct the imbalance. Hyperparathyroidism is usually treated by surgical removal of one or more of the parathyroid glands and any tissue, other than the glands themselves, that is producing too much hormone.  The hypercalcemia caused by cancer is difficult to treat without controlling the cancer. Symptoms can be improved with fluids and drug therapy as outlined above. PROGNOSIS   Surgery to remove the parathyroid glands is usually successful. This also depends on the amount of damage to the kidneys and whether or not it can be treated.  Mild hypercalcemia can be controlled with good fluid intake and the use of effective medications.  Hypercalcemia often develops as a late complication of cancer. The expected outlook is poor without effective anticancer therapy. PREVENTION   If you are at risk for developing hypercalcemia, be familiar with early symptoms. Report these to your caregiver.  Good fluid intake (up to four quarts of liquid a day if possible) is helpful.  Try to  control nausea and vomiting, and treat fevers to avoid dehydration.  Lowering the amount of calcium in your diet is not necessary. High blood calcium reduces absorption of calcium in the intestine.  Stay as active as possible. SEEK IMMEDIATE MEDICAL CARE IF:   You develop chest pain, sweating, or shortness of breath.  You get confused, feel faint or pass out.  You develop severe nausea and vomiting. MAKE SURE YOU:   Understand these instructions.  Will watch your condition.  Will get help right away if you are not doing well or get worse. Document Released: 02/17/2005 Document Revised: 03/31/2013 Document Reviewed: 11/29/2010 [facility] Patient Information 2014 [place], [place].

## 2014-02-10 LAB — VITAMIN D 25 HYDROXY (VIT D DEFICIENCY, FRACTURES): VIT D 25 HYDROXY: 14 ng/mL — AB (ref 30–89)

## 2014-02-10 LAB — PTH, INTACT AND CALCIUM
Calcium: 10.4 mg/dL (ref 8.4–10.5)
PTH: 165.2 pg/mL — ABNORMAL HIGH (ref 14.0–72.0)

## 2014-02-12 LAB — VITAMIN D 1,25 DIHYDROXY
VITAMIN D 1, 25 (OH) TOTAL: 129 pg/mL — AB (ref 18–72)
VITAMIN D2 1, 25 (OH): 29 pg/mL
VITAMIN D3 1, 25 (OH): 100 pg/mL

## 2014-02-16 ENCOUNTER — Encounter: Payer: Self-pay | Admitting: Internal Medicine

## 2014-04-27 ENCOUNTER — Encounter (HOSPITAL_COMMUNITY): Payer: Self-pay | Admitting: Emergency Medicine

## 2014-04-27 ENCOUNTER — Emergency Department (HOSPITAL_COMMUNITY): Payer: Medicaid Other

## 2014-04-27 ENCOUNTER — Emergency Department (HOSPITAL_COMMUNITY)
Admission: EM | Admit: 2014-04-27 | Discharge: 2014-04-28 | Disposition: A | Discharge status: Home / Self Care | Payer: Medicaid Other | Attending: Emergency Medicine | Admitting: Emergency Medicine

## 2014-04-27 DIAGNOSIS — R519 Headache, unspecified: Secondary | ICD-10-CM

## 2014-04-27 DIAGNOSIS — D496 Neoplasm of unspecified behavior of brain: Secondary | ICD-10-CM | POA: Insufficient documentation

## 2014-04-27 DIAGNOSIS — D329 Benign neoplasm of meninges, unspecified: Secondary | ICD-10-CM

## 2014-04-27 DIAGNOSIS — R51 Headache: Secondary | ICD-10-CM

## 2014-04-27 DIAGNOSIS — I10 Essential (primary) hypertension: Secondary | ICD-10-CM | POA: Insufficient documentation

## 2014-04-27 DIAGNOSIS — N390 Urinary tract infection, site not specified: Secondary | ICD-10-CM | POA: Insufficient documentation

## 2014-04-27 DIAGNOSIS — N200 Calculus of kidney: Secondary | ICD-10-CM | POA: Insufficient documentation

## 2014-04-27 DIAGNOSIS — E669 Obesity, unspecified: Secondary | ICD-10-CM | POA: Insufficient documentation

## 2014-04-27 DIAGNOSIS — I2699 Other pulmonary embolism without acute cor pulmonale: Secondary | ICD-10-CM | POA: Insufficient documentation

## 2014-04-27 LAB — COMPREHENSIVE METABOLIC PANEL
ALBUMIN: 3.4 g/dL — AB (ref 3.5–5.2)
ALK PHOS: 155 U/L — AB (ref 39–117)
ALT: 13 U/L (ref 0–35)
AST: 15 U/L (ref 0–37)
BUN: 9 mg/dL (ref 6–23)
CO2: 26 mEq/L (ref 19–32)
Calcium: 10.4 mg/dL (ref 8.4–10.5)
Chloride: 103 mEq/L (ref 96–112)
Creatinine, Ser: 0.66 mg/dL (ref 0.50–1.10)
GFR calc Af Amer: >90 mL/min (ref >90)
GFR calc non Af Amer: >90 mL/min (ref >90)
Glucose, Bld: 156 mg/dL — ABNORMAL HIGH (ref 70–99)
POTASSIUM: 4 meq/L (ref 3.7–5.3)
Sodium: 139 mEq/L (ref 137–147)
TOTAL PROTEIN: 7.8 g/dL (ref 6.0–8.3)
Total Bilirubin: 0.3 mg/dL (ref 0.3–1.2)

## 2014-04-27 LAB — CBC
HEMATOCRIT: 41.1 % (ref 36.0–46.0)
Hemoglobin: 13.2 g/dL (ref 12.0–15.0)
MCH: 28.1 pg (ref 26.0–34.0)
MCHC: 32.1 g/dL (ref 30.0–36.0)
MCV: 87.4 fL (ref 78.0–100.0)
PLATELETS: 332 10*3/uL (ref 150–400)
RBC: 4.7 MIL/uL (ref 3.87–5.11)
RDW: 14.2 % (ref 11.5–15.5)
WBC: 6 10*3/uL (ref 4.0–10.5)

## 2014-04-27 LAB — URINALYSIS, ROUTINE W REFLEX MICROSCOPIC
Bilirubin Urine: NEGATIVE
GLUCOSE, UA: NEGATIVE mg/dL
Hgb urine dipstick: NEGATIVE
KETONES UR: NEGATIVE mg/dL
LEUKOCYTES UA: NEGATIVE
Nitrite: NEGATIVE
PH: 6.5 (ref 5.0–8.0)
Protein, ur: NEGATIVE mg/dL
Specific Gravity, Urine: 1.023 (ref 1.005–1.030)
Urobilinogen, UA: 1 mg/dL (ref 0.0–1.0)

## 2014-04-27 MED ORDER — HYDROMORPHONE HCL PF 1 MG/ML IJ SOLN
0.5000 mg | Freq: Once | INTRAMUSCULAR | Status: AC
  Administered 2014-04-27: 0.5 mg via INTRAVENOUS
  Filled 2014-04-27: qty 1

## 2014-04-27 MED ORDER — SODIUM CHLORIDE 0.9 % IV BOLUS (SEPSIS)
1000.0000 mL | Freq: Once | INTRAVENOUS | Status: AC
  Administered 2014-04-27: 1000 mL via INTRAVENOUS

## 2014-04-27 MED ORDER — TETRACAINE HCL 0.5 % OP SOLN
1.0000 [drp] | Freq: Once | OPHTHALMIC | Status: AC
  Administered 2014-04-27: 1 [drp] via OPHTHALMIC
  Filled 2014-04-27: qty 2

## 2014-04-27 MED ORDER — GADOBENATE DIMEGLUMINE 529 MG/ML IV SOLN
20.0000 mL | Freq: Once | INTRAVENOUS | Status: AC
Start: 2014-04-27 — End: 2014-04-27
  Administered 2014-04-27: 20 mL via INTRAVENOUS

## 2014-04-27 MED ORDER — METOCLOPRAMIDE HCL 5 MG/ML IJ SOLN
10.0000 mg | Freq: Once | INTRAMUSCULAR | Status: AC
  Administered 2014-04-27: 10 mg via INTRAVENOUS
  Filled 2014-04-27: qty 2

## 2014-04-27 MED ORDER — KETOROLAC TROMETHAMINE 30 MG/ML IJ SOLN
30.0000 mg | Freq: Once | INTRAMUSCULAR | Status: AC
  Administered 2014-04-27: 30 mg via INTRAVENOUS
  Filled 2014-04-27: qty 1

## 2014-04-27 MED ORDER — DIPHENHYDRAMINE HCL 50 MG/ML IJ SOLN
25.0000 mg | Freq: Once | INTRAMUSCULAR | Status: AC
  Administered 2014-04-27: 25 mg via INTRAVENOUS
  Filled 2014-04-27: qty 1

## 2014-04-27 NOTE — ED Notes (Signed)
[facility] notified of transfer.

## 2014-04-27 NOTE — ED Notes (Signed)
Back from [facility]

## 2014-04-27 NOTE — ED Notes (Signed)
Pt states that she has mengioma for year and half now on left side. Pt states for past 2 weeks she has increased pain at site and worsening blurred vision in left eye. Pt states that pain is only on left side.  Pt gets treated at [facility] but was told to come here since closer for her.

## 2014-04-27 NOTE — ED Provider Notes (Signed)
CSN: [id number]     Arrival date & time 04/27/14  1342 History   First MD Initiated Contact with Patient 04/27/14 1524     Chief Complaint  Patient presents with  . Headache  . Blurred Vision    left eye     (Consider location/radiation/quality/duration/timing/severity/associated sxs/prior Treatment) HPI Comments: The patient is a 43 year Ortega with a past medical of history of left cavernous spinous meningioma s/p radiation, presenting to the emergency department with a chief complaint of left sided headache for 2 weeks. The patient describes the discomfort as constant pressure. Discomfort is worsened by light and laying down.  The patient reports intermittent ipsilateral facial twitching.  She reports chronic blurry vision in the left eye since 08/2012. Also states left eye ptosis is chronic, partially relieved by previous radiation therapy, recently worsened. [name] neurologist: Carol Ortega   Patient is a 43 y.o. female presenting with headaches. The history is provided by the patient and medical records.  Headache Associated symptoms: ear pain, eye pain, myalgias, nausea and photophobia   Associated symptoms: no abdominal pain, no diarrhea, no dizziness, no fever, no numbness and no vomiting     Past Medical History  Diagnosis Date  . Kidney stone   . UTI (urinary tract infection)   . Obesity   . Brain tumor   . Radiation     Brain tumor  . Hypertension   . Pulmonary emboli 2011  . Complication of anesthesia 1999    lung collapse after surgery for gallbladder   Past Surgical History  Procedure Laterality Date  . Kidney stone removal    . Lithotripsy    . Cystoscopy w/ ureteroscopy    . Cystoscopy with stent placement Left 01/12/2014    Procedure: CYSTOSCOPY WITH STENT PLACEMENT left retrograde;  Surgeon: Carol Seal, MD;  Location: [facility];  Service: Urology;  Laterality: Left;  . Surgery for,ectopic pregnancy  2011     1 fallopian tube rupture  . Tubal ligation  2011  .  Cholecystectomy  1999  . Cystoscopy with retrograde pyelogram, ureteroscopy and stent placement Left 01/20/2014    Procedure: LEFT URETEROSCOPY WITH STENT PLACEMENT;  Surgeon: Carol Seal, MD;  Location: [facility];  Service: Urology;  Laterality: Left;  . Holmium laser application Left 02/17/6711    Procedure: HOLMIUM LASER APPLICATION;  Surgeon: Carol Seal, MD;  Location: [facility];  Service: Urology;  Laterality: Left;   No family history on file. History  Substance Use Topics  . Smoking status: Never Smoker   . Smokeless tobacco: Never Used  . Alcohol Use: Yes     Comment: social only   OB History   Grav Para Term Preterm Abortions TAB SAB Ect Mult Living                 Review of Systems  Constitutional: Negative for fever and chills.  HENT: Positive for ear pain.   Eyes: Positive for photophobia, pain and visual disturbance. Negative for discharge.  Respiratory: Negative for choking.   Gastrointestinal: Positive for nausea. Negative for vomiting, abdominal pain, diarrhea and constipation.  Musculoskeletal: Positive for myalgias.  Skin: Negative for color change.  Neurological: Positive for headaches. Negative for dizziness, syncope, speech difficulty, weakness, light-headedness and numbness.      Allergies  Eggs or egg-derived products; Penicillins; Latex; and Penicillin g  Home Medications   Prior to Admission medications   Medication Sig Start Date End Date Taking? Authorizing Provider  albuterol (PROVENTIL HFA;VENTOLIN HFA) 108 (90 BASE)  MCG/ACT inhaler Inhale 2 puffs into the lungs every 6 (six) hours as needed. Shortness of breath   Yes Historical Provider, MD  cholecalciferol (VITAMIN D) 1000 UNITS tablet Take 2,000 Units by mouth daily.   Yes Historical Provider, MD  HYDROcodone-acetaminophen (NORCO) 10-325 MG per tablet Take 1 tablet by mouth every 6 (six) hours as needed for pain (pain).   Yes Historical Provider, MD  lisinopril-hydrochlorothiazide (PRINZIDE,ZESTORETIC)  20-12.5 MG per tablet Take 1 tablet by mouth every evening.    Yes Historical Provider, MD  LORazepam (ATIVAN) 1 MG tablet Take 1 mg by mouth every 6 (six) hours as needed for anxiety.    Yes Historical Provider, MD   BP 128/83  Pulse 71  Temp(Src) 98.7 F (37.1 C) (Oral)  Resp 18  SpO2 100%  LMP 04/17/2014 Physical Exam  Nursing note and vitals reviewed. Constitutional: She is oriented to person, place, and time. She appears well-developed and well-nourished. No distress.  HENT:  Head: Normocephalic and atraumatic.  Eyes: EOM are normal. Pupils are equal, round, and reactive to light. Right eye exhibits no discharge. Left eye exhibits no discharge. No scleral icterus.  IOP: Left 16-15 Right 13-11  Neck: Neck supple.  Cardiovascular: Normal rate, regular rhythm and normal heart sounds.   No murmur heard. Pulmonary/Chest: Effort normal and breath sounds normal. She has no wheezes.  Abdominal: Soft. Bowel sounds are normal. There is no tenderness. There is no rebound and no guarding.  Musculoskeletal: Normal range of motion. She exhibits no edema.  Mild left ptosis. Good sensation to lite touch. Speech is clear and goal oriented, follows commands Cranial nerves III - XII grossly intact Normal strength in upper and lower extremities bilaterally, strong and equal grip strength Sensation normal to light touch Normal gait without assistance. Moves all 4 extremities without ataxia, coordination intact  Lymphadenopathy:       Head (right side): No submental, no submandibular, no tonsillar, no preauricular, no posterior auricular and no occipital adenopathy present.       Head (left side): No submental, no submandibular, no tonsillar, no preauricular, no posterior auricular and no occipital adenopathy present.    She has no cervical adenopathy.  Neurological: She is alert and oriented to person, place, and time. Coordination normal.  Mild left eye droop. Good sensation to lite  touch. Speech is clear and goal oriented, follows commands Cranial nerves III - XII grossly intact. Visual fields intact. Normal strength in upper and lower extremities bilaterally, strong and equal grip strength Sensation normal to light touch Normal gait without assistance. Moves all 4 extremities without ataxia, coordination intact  Skin: Skin is warm and dry. No rash noted. She is not diaphoretic.  Psychiatric: She has a normal mood and affect. Her behavior is normal. Thought content normal.    ED Course  Procedures (including critical care time) Visual Acuity CC Visual Acuity - R Distance: 20/50 (patient wears glasses but they are currently broken) ; L Distance: 20/100  Labs Review Results for orders placed during the hospital encounter of 04/27/14  CBC      Result Value Ref Range   WBC 6.0  4.0 - 10.5 K/uL   RBC 4.70  3.87 - 5.11 MIL/uL   Hemoglobin 13.2  12.0 - 15.0 g/dL   HCT 41.1  36.0 - 46.0 %   MCV 87.4  78.0 - 100.0 fL   MCH 28.1  26.0 - 34.0 pg   MCHC 32.1  30.0 - 36.0 g/dL   RDW  14.2  11.5 - 15.5 %   Platelets 332  150 - 400 K/uL  COMPREHENSIVE METABOLIC PANEL      Result Value Ref Range   Sodium 139  137 - 147 mEq/L   Potassium 4.0  3.7 - 5.3 mEq/L   Chloride 103  96 - 112 mEq/L   CO2 26  19 - 32 mEq/L   Glucose, Bld 156 (*) 70 - 99 mg/dL   BUN 9  6 - 23 mg/dL   Creatinine, Ser 0.66  0.50 - 1.10 mg/dL   Calcium 10.4  8.4 - 10.5 mg/dL   Total Protein 7.8  6.0 - 8.3 g/dL   Albumin 3.4 (*) 3.5 - 5.2 g/dL   AST 15  0 - 37 U/L   ALT 13  0 - 35 U/L   Alkaline Phosphatase 155 (*) 39 - 117 U/L   Total Bilirubin 0.3  0.3 - 1.2 mg/dL   GFR calc non Af Amer >90  >90 mL/min   GFR calc Af Amer >90  >90 mL/min  URINALYSIS, ROUTINE W REFLEX MICROSCOPIC      Result Value Ref Range   Color, Urine YELLOW  YELLOW   APPearance CLOUDY (*) CLEAR   Specific Gravity, Urine 1.023  1.005 - 1.030   pH 6.5  5.0 - 8.0   Glucose, UA NEGATIVE  NEGATIVE mg/dL   Hgb urine dipstick  NEGATIVE  NEGATIVE   Bilirubin Urine NEGATIVE  NEGATIVE   Ketones, ur NEGATIVE  NEGATIVE mg/dL   Protein, ur NEGATIVE  NEGATIVE mg/dL   Urobilinogen, UA 1.0  0.0 - 1.0 mg/dL   Nitrite NEGATIVE  NEGATIVE   Leukocytes, UA NEGATIVE  NEGATIVE     MDM   Final diagnoses:  Headache  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 Labs, medication ordered.  Discussed patient history, condition with Dr. Alvino Chapel who agrees on MRI imaging to evaluate meningioma. Patient does have a history of adverse reaction to MRI contrast, reports multiple MRIs with contrast the past "as long as they go slow with medication". Patient's body habitus limits MRI imaging at this facility and we transferred to [facility] one for MRI. Pt will be transfurred to [facility] due to weight limit of MRI machine.  Discussed history, condition, treatment plan with Dr. Steffanie Dunn who agrees to follow up on the MRI results.  If the pt needs admission consult to Dr. Vallarie Mare at [facility]. Re-eval pt resting comfortably in room texting on her phone. Discussed transfer to [facility] for MRI of head, pt agrees.  Meds given in ED:  Medications  tetracaine (PONTOCAINE) 0.5 % ophthalmic solution 1 drop (1 drop Both Eyes Given by Other 04/27/14 1745)  sodium chloride 0.9 % bolus 1,000 mL (0 mLs Intravenous Stopped 04/27/14 1753)  ketorolac (TORADOL) 30 MG/ML injection 30 mg (30 mg Intravenous Given 04/27/14 1610)  diphenhydrAMINE (BENADRYL) injection 25 mg (25 mg Intravenous Given 04/27/14 1651)  metoCLOPramide (REGLAN) injection 10 mg (10 mg Intravenous Given 04/27/14 1832)    New Prescriptions   No medications on file     Lorrine Kin, PA-C 04/28/14 0103

## 2014-04-27 NOTE — ED Notes (Signed)
Patient transported to MRI 

## 2014-04-27 NOTE — ED Notes (Signed)
Report called to [place] ED.  

## 2014-04-27 NOTE — ED Provider Notes (Signed)
CSN: [id number]     Arrival date & time 04/27/14  1342 History   First MD Initiated Contact with Patient 04/27/14 1524     Chief Complaint  Patient presents with  . Headache  . Blurred Vision    left eye     (Consider location/radiation/quality/duration/timing/severity/associated sxs/prior Treatment) HPI Comments: 43 year old female with history of hypercalcemia, meningioma, radiation approximately 1.5 years ago, followed at [facility] neuro oncology Dr. Vallarie Mare presents with left blurry vision and left-sided headache intermittent for the past 2 weeks. Patient feels that the symptoms are milder version of her symptoms that led to her diagnosis. Patient has outpatient followup was told to come in for evaluation. Patient was seen at [facility] however due to weight patient was transferred here for MRI. Headache is intermittent left shooting pain from posterior to the front. No injuries.  Patient is a 43 y.o. female presenting with headaches. The history is provided by the patient.  Headache Associated symptoms: no abdominal pain, no back pain, no congestion, no fever, no neck pain, no neck stiffness and no vomiting     Past Medical History  Diagnosis Date  . Kidney stone   . UTI (urinary tract infection)   . Obesity   . Brain tumor   . Radiation     Brain tumor  . Hypertension   . Pulmonary emboli 2011  . Complication of anesthesia 1999    lung collapse after surgery for gallbladder   Past Surgical History  Procedure Laterality Date  . Kidney stone removal    . Lithotripsy    . Cystoscopy w/ ureteroscopy    . Cystoscopy with stent placement Left 01/12/2014    Procedure: CYSTOSCOPY WITH STENT PLACEMENT left retrograde;  Surgeon: Irine Seal, MD;  Location: [facility];  Service: Urology;  Laterality: Left;  . Surgery for,ectopic pregnancy  2011     1 fallopian tube rupture  . Tubal ligation  2011  . Cholecystectomy  1999  . Cystoscopy with retrograde pyelogram,  ureteroscopy and stent placement Left 01/20/2014    Procedure: LEFT URETEROSCOPY WITH STENT PLACEMENT;  Surgeon: Irine Seal, MD;  Location: [facility];  Service: Urology;  Laterality: Left;  . Holmium laser application Left 01/22/9562    Procedure: HOLMIUM LASER APPLICATION;  Surgeon: Irine Seal, MD;  Location: [facility];  Service: Urology;  Laterality: Left;   No family history on file. History  Substance Use Topics  . Smoking status: Never Smoker   . Smokeless tobacco: Never Used  . Alcohol Use: Yes     Comment: social only   OB History   Grav Para Term Preterm Abortions TAB SAB Ect Mult Living                 Review of Systems  Constitutional: Negative for fever and chills.  HENT: Negative for congestion.   Eyes: Positive for visual disturbance (blurry left eye).  Respiratory: Negative for shortness of breath.   Cardiovascular: Negative for chest pain.  Gastrointestinal: Negative for vomiting and abdominal pain.  Genitourinary: Negative for dysuria and flank pain.  Musculoskeletal: Negative for back pain, neck pain and neck stiffness.  Skin: Negative for rash.  Neurological: Positive for headaches. Negative for light-headedness.      Allergies  Eggs or egg-derived products; Penicillins; Latex; and Penicillin g  Home Medications   Prior to Admission medications   Medication Sig Start Date End Date Taking? Authorizing Provider  albuterol (PROVENTIL HFA;VENTOLIN HFA) 108 (90 BASE) MCG/ACT inhaler Inhale 2  puffs into the lungs every 6 (six) hours as needed. Shortness of breath   Yes Historical Provider, MD  cholecalciferol (VITAMIN D) 1000 UNITS tablet Take 2,000 Units by mouth daily.   Yes Historical Provider, MD  HYDROcodone-acetaminophen (NORCO) 10-325 MG per tablet Take 1 tablet by mouth every 6 (six) hours as needed for pain (pain).   Yes Historical Provider, MD  lisinopril-hydrochlorothiazide (PRINZIDE,ZESTORETIC) 20-12.5 MG per tablet Take 1 tablet by mouth every evening.    Yes  Historical Provider, MD  LORazepam (ATIVAN) 1 MG tablet Take 1 mg by mouth every 6 (six) hours as needed for anxiety.    Yes Historical Provider, MD   BP 137/94  Pulse 81  Temp(Src) 99.8 F (37.7 C) (Oral)  Resp 20  SpO2 99%  LMP 04/17/2014 Physical Exam  Nursing note and vitals reviewed. Constitutional: She is oriented to person, place, and time. She appears well-developed and well-nourished.  HENT:  Head: Normocephalic and atraumatic.  Eyes: Conjunctivae are normal. Right eye exhibits no discharge. Left eye exhibits no discharge.  Neck: Normal range of motion. Neck supple. No tracheal deviation present.  Cardiovascular: Normal rate and regular rhythm.   Pulmonary/Chest: Effort normal and breath sounds normal.  Abdominal: Soft. She exhibits no distension. There is no tenderness. There is no guarding.  Musculoskeletal: She exhibits no edema.  Neurological: She is alert and oriented to person, place, and time. No cranial nerve deficit. GCS eye subscore is 4. GCS verbal subscore is 5. GCS motor subscore is 6.  5+ strength in UE and LE with f/e at major joints. Sensation to palpation intact in UE and LE. CNs 2-12 grossly intact.  EOMFI.  PERRL.   Finger nose and coordination intact bilateral.   Visual fields intact to finger testing.   Skin: Skin is warm. No rash noted.  Psychiatric: She has a normal mood and affect.    ED Course  Procedures (including critical care time) Labs Review Labs Reviewed  COMPREHENSIVE METABOLIC PANEL - Abnormal; Notable for the following:    Glucose, Bld 156 (*)    Albumin 3.4 (*)    Alkaline Phosphatase 155 (*)    All other components within normal limits  URINALYSIS, ROUTINE W REFLEX MICROSCOPIC - Abnormal; Notable for the following:    APPearance CLOUDY (*)    All other components within normal limits  CBC  POC URINE PREG, ED    Imaging Review Mr Kizzie Fantasia Contrast  04/27/2014   CLINICAL DATA:  Blurry vision.  History of skullbase  meningioma.  EXAM: MRI HEAD WITHOUT AND WITH CONTRAST  TECHNIQUE: Multiplanar, multiecho pulse sequences of the brain and surrounding structures were obtained without and with intravenous contrast.  CONTRAST:  16mL MULTIHANCE GADOBENATE DIMEGLUMINE 529 MG/ML IV SOLN  COMPARISON:  CT HEAD W/O CM dated 12/13/2013; MR ORBITS WO/W CM dated 09/14/2012; MR HEAD WO/W CM dated 09/14/2012  FINDINGS: No reduced diffusion to suggest acute ischemia. No susceptibility artifact to suggest hemorrhage.  Susceptibility artifact associated with known left sphenoid wing/orbital apex meningioma. The low T2, homogeneously enhancing meningioma extends into the left orbital apex, with similar cavernous sinus invasion. Mass results and left proptosis, expanding the left sphenoid wing, with extension into the left sphenoid sinus, unchanged. Measures 35 x 21 x 23 mm, previous 35 x 24 x 22 mm, relatively stable. Compression of the extraocular muscles on the left again seen, the left extraocular muscles are somewhat enlarged, possibly edematous and, unchanged. The mass effect on the left optic nerve,  pinched appearance at the orbital apex though, there is no convincing evidence of normal optic nerve signal nor enhancement.  The ventricles and sulci are normal for patient's age. No abnormal parenchymal signal nor abnormal parenchymal enhancement. No abnormal extra-axial fluid collections nor leptomeningeal enhancement. Normal major intracranial vascular flow voids seen at the skull base; diminutive basilar artery with bilateral posterior communicating artery flow voids identified.  The sphenoid sinus air-fluid level. Left maxillary mucosal retention cyst. Mastoid air cells are well aerated. No abnormal sellar expansion. Diffusely heterogeneous bone marrow, with patchy areas of fatty replacement and enhancement, similar.  IMPRESSION: Stable appearance of approximate 35 x 21 x 23 mm left sphenoid wing/orbital apex meningioma with cavernous sinus  invasion, with mass effect on the left optic nerve, no convincing evidence of nerve edema.  No acute intracranial process.  Heterogeneous bone marrow signal, with patchy areas of apparent superimposed mild enhancement suggesting en plaque meningiomas.   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 MRI brain performed and results reviewed by myself and discussed with neurosurgeon at [facility] neurosurgeon felt patient was safe for close outpatient followup and no urgent intervention needed at this time. Normal neurologic exam in the ED. Pain medicines given in ED IV narcotics. Discussed close outpatient followup with her neuro-oncologist in the next 48 hours.  Results and differential diagnosis were discussed with the patient. Close follow up outpatient was discussed, patient comfortable with the plan.   Filed Vitals:   04/27/14 1406 04/27/14 1838 04/27/14 2048  BP: 128/83 131/75 137/94  Pulse: 71 84 81  Temp: 98.7 F (37.1 C) 97.8 F (36.6 C) 99.8 F (37.7 C)  TempSrc: Oral Oral Oral  Resp: 18 20 20   SpO2: 100% 100% 99%        Mariea Clonts, MD 04/27/14 2313

## 2014-04-27 NOTE — ED Notes (Signed)
Report given to [facility]  

## 2014-04-27 NOTE — Discharge Instructions (Signed)
Call your neuro-oncologist tomorrow for an appointment. Return to see a physician if you develop weakness, fevers or worsening or new symptoms.  If you were given medicines take as directed.  If you are on coumadin or contraceptives realize their levels and effectiveness is altered by many different medicines.  If you have any reaction (rash, tongues swelling, other) to the medicines stop taking and see a physician.   Please follow up as directed and return to the ER or see a physician for new or worsening symptoms.  Thank you. Filed Vitals:   04/27/14 1406 04/27/14 1838 04/27/14 2048  BP: 128/83 131/75 137/94  Pulse: 71 84 81  Temp: 98.7 F (37.1 C) 97.8 F (36.6 C) 99.8 F (37.7 C)  TempSrc: Oral Oral Oral  Resp: 18 20 20   SpO2: 100% 100% 99%

## 2014-04-28 NOTE — ED Provider Notes (Signed)
Medical screening examination/treatment/procedure(s) were conducted as a shared visit with non-physician practitioner(s) and myself.  I personally evaluated the patient during the encounter.   EKG Interpretation None     Patient with headache. Has no meningiomas and has had radiation therapy. Headache is worsened. Patient's needs MRI, but is too obese to fit in the scanner at [facility] long and will be transferred to [facility] to get MRI`  Carol Ortega. Alvino Chapel, MD 04/28/14 1446

## 2014-05-27 ENCOUNTER — Emergency Department (HOSPITAL_COMMUNITY): Payer: Medicaid Other

## 2014-05-27 ENCOUNTER — Emergency Department (HOSPITAL_COMMUNITY)
Admission: EM | Admit: 2014-05-27 | Discharge: 2014-05-27 | Discharge status: Against Medical Advice | Payer: Medicaid Other

## 2014-05-27 ENCOUNTER — Emergency Department (HOSPITAL_COMMUNITY)
Admission: EM | Admit: 2014-05-27 | Discharge: 2014-05-27 | Disposition: A | Discharge status: Home / Self Care | Payer: Medicaid Other | Attending: Emergency Medicine | Admitting: Emergency Medicine

## 2014-05-27 DIAGNOSIS — I10 Essential (primary) hypertension: Secondary | ICD-10-CM | POA: Insufficient documentation

## 2014-05-27 DIAGNOSIS — Z79899 Other long term (current) drug therapy: Secondary | ICD-10-CM | POA: Insufficient documentation

## 2014-05-27 DIAGNOSIS — R51 Headache: Secondary | ICD-10-CM | POA: Insufficient documentation

## 2014-05-27 DIAGNOSIS — R42 Dizziness and giddiness: Secondary | ICD-10-CM | POA: Insufficient documentation

## 2014-05-27 DIAGNOSIS — H02409 Unspecified ptosis of unspecified eyelid: Secondary | ICD-10-CM | POA: Insufficient documentation

## 2014-05-27 DIAGNOSIS — E669 Obesity, unspecified: Secondary | ICD-10-CM | POA: Insufficient documentation

## 2014-05-27 DIAGNOSIS — Z91040 Latex allergy status: Secondary | ICD-10-CM | POA: Insufficient documentation

## 2014-05-27 DIAGNOSIS — R519 Headache, unspecified: Secondary | ICD-10-CM

## 2014-05-27 DIAGNOSIS — Z86711 Personal history of pulmonary embolism: Secondary | ICD-10-CM | POA: Insufficient documentation

## 2014-05-27 DIAGNOSIS — Z87442 Personal history of urinary calculi: Secondary | ICD-10-CM | POA: Insufficient documentation

## 2014-05-27 DIAGNOSIS — H539 Unspecified visual disturbance: Secondary | ICD-10-CM

## 2014-05-27 DIAGNOSIS — Z880 Allergy status to penicillin: Secondary | ICD-10-CM | POA: Insufficient documentation

## 2014-05-27 DIAGNOSIS — H02402 Unspecified ptosis of left eyelid: Secondary | ICD-10-CM

## 2014-05-27 DIAGNOSIS — Z87440 Personal history of urinary (tract) infections: Secondary | ICD-10-CM | POA: Insufficient documentation

## 2014-05-27 LAB — COMPREHENSIVE METABOLIC PANEL
ALBUMIN: 3.4 g/dL — AB (ref 3.5–5.2)
ALK PHOS: 146 U/L — AB (ref 39–117)
ALT: 12 U/L (ref 0–35)
AST: 15 U/L (ref 0–37)
BILIRUBIN TOTAL: 0.4 mg/dL (ref 0.3–1.2)
BUN: 10 mg/dL (ref 6–23)
CHLORIDE: 104 meq/L (ref 96–112)
CO2: 21 mEq/L (ref 19–32)
CREATININE: 0.6 mg/dL (ref 0.50–1.10)
Calcium: 10.6 mg/dL — ABNORMAL HIGH (ref 8.4–10.5)
GFR calc Af Amer: >90 mL/min (ref >90)
GFR calc non Af Amer: >90 mL/min (ref >90)
Glucose, Bld: 129 mg/dL — ABNORMAL HIGH (ref 70–99)
POTASSIUM: 4.1 meq/L (ref 3.7–5.3)
Sodium: 138 mEq/L (ref 137–147)
Total Protein: 7.6 g/dL (ref 6.0–8.3)

## 2014-05-27 LAB — CBC
HCT: 41.6 % (ref 36.0–46.0)
Hemoglobin: 13.3 g/dL (ref 12.0–15.0)
MCH: 28.2 pg (ref 26.0–34.0)
MCHC: 32 g/dL (ref 30.0–36.0)
MCV: 88.1 fL (ref 78.0–100.0)
Platelets: 312 10*3/uL (ref 150–400)
RBC: 4.72 MIL/uL (ref 3.87–5.11)
RDW: 14.2 % (ref 11.5–15.5)
WBC: 8.2 10*3/uL (ref 4.0–10.5)

## 2014-05-27 LAB — DIFFERENTIAL
BASOS PCT: 1 % (ref 0–1)
Basophils Absolute: 0 10*3/uL (ref 0.0–0.1)
Eosinophils Absolute: 0.2 10*3/uL (ref 0.0–0.7)
Eosinophils Relative: 2 % (ref 0–5)
Lymphocytes Relative: 37 % (ref 12–46)
Lymphs Abs: 3 10*3/uL (ref 0.7–4.0)
Monocytes Absolute: 0.8 10*3/uL (ref 0.1–1.0)
Monocytes Relative: 9 % (ref 3–12)
NEUTROS ABS: 4.2 10*3/uL (ref 1.7–7.7)
NEUTROS PCT: 51 % (ref 43–77)

## 2014-05-27 LAB — APTT: APTT: 36 s (ref 24–37)

## 2014-05-27 LAB — PROTIME-INR
INR: 1.03 (ref 0.00–1.49)
Prothrombin Time: 13.3 seconds (ref 11.6–15.2)

## 2014-05-27 LAB — I-STAT TROPONIN, ED: Troponin i, poc: 0 ng/mL (ref 0.00–0.08)

## 2014-05-27 MED ORDER — METHYLPREDNISOLONE SODIUM SUCC 125 MG IJ SOLR
125.0000 mg | Freq: Once | INTRAMUSCULAR | Status: AC
  Administered 2014-05-27: 125 mg via INTRAVENOUS
  Filled 2014-05-27: qty 2

## 2014-05-27 MED ORDER — DIPHENHYDRAMINE HCL 50 MG/ML IJ SOLN
12.5000 mg | Freq: Once | INTRAMUSCULAR | Status: AC
  Administered 2014-05-27: 12.5 mg via INTRAVENOUS
  Filled 2014-05-27: qty 1

## 2014-05-27 MED ORDER — METOCLOPRAMIDE HCL 5 MG/ML IJ SOLN
5.0000 mg | Freq: Once | INTRAMUSCULAR | Status: AC
  Administered 2014-05-27: 5 mg via INTRAVENOUS
  Filled 2014-05-27: qty 2

## 2014-05-27 MED ORDER — PREDNISONE 50 MG PO TABS: ORAL_TABLET | ORAL | Status: DC

## 2014-05-27 MED ORDER — SODIUM CHLORIDE 0.9 % IV BOLUS (SEPSIS)
1000.0000 mL | INTRAVENOUS | Status: AC
  Administered 2014-05-27: 1000 mL via INTRAVENOUS

## 2014-05-27 NOTE — ED Provider Notes (Signed)
CSN: [id number]     Arrival date & time 05/27/14  0321 History   First MD Initiated Contact with Patient 05/27/14 [phone]     Chief Complaint  Patient presents with  . Dizziness  . Headache  . Stroke Symptoms     (Consider location/radiation/quality/duration/timing/severity/associated sxs/prior Treatment) Patient is a 43 y.o. female presenting with neurologic complaint. The history is provided by the patient.  Neurologic Problem This is a new problem. The current episode started 3 to 5 hours ago. The problem occurs constantly. The problem has not changed since onset.Associated symptoms include headaches. Pertinent negatives include no chest pain, no abdominal pain and no shortness of breath. The symptoms are aggravated by walking. Nothing relieves the symptoms. She has tried nothing for the symptoms. The treatment provided no relief.    Past Medical History  Diagnosis Date  . Kidney stone   . UTI (urinary tract infection)   . Obesity   . Brain tumor   . Radiation     Brain tumor  . Hypertension   . Pulmonary emboli 2011  . Complication of anesthesia 1999    lung collapse after surgery for gallbladder   Past Surgical History  Procedure Laterality Date  . Kidney stone removal    . Lithotripsy    . Cystoscopy w/ ureteroscopy    . Cystoscopy with stent placement Left 01/12/2014    Procedure: CYSTOSCOPY WITH STENT PLACEMENT left retrograde;  Surgeon: Irine Seal, MD;  Location: [facility];  Service: Urology;  Laterality: Left;  . Surgery for,ectopic pregnancy  2011     1 fallopian tube rupture  . Tubal ligation  2011  . Cholecystectomy  1999  . Cystoscopy with retrograde pyelogram, ureteroscopy and stent placement Left 01/20/2014    Procedure: LEFT URETEROSCOPY WITH STENT PLACEMENT;  Surgeon: Irine Seal, MD;  Location: [facility];  Service: Urology;  Laterality: Left;  . Holmium laser application Left 02/17/3556    Procedure: HOLMIUM LASER APPLICATION;  Surgeon: Irine Seal, MD;  Location: [facility];  Service: Urology;  Laterality: Left;   No family history on file. History  Substance Use Topics  . Smoking status: Never Smoker   . Smokeless tobacco: Never Used  . Alcohol Use: Yes     Comment: social only   OB History   Grav Para Term Preterm Abortions TAB SAB Ect Mult Living                 Review of Systems  Constitutional: Negative for fever and fatigue.  HENT: Negative for congestion and drooling.   Eyes: Negative for pain.  Respiratory: Negative for cough and shortness of breath.   Cardiovascular: Negative for chest pain.  Gastrointestinal: Negative for nausea, vomiting, abdominal pain and diarrhea.  Genitourinary: Negative for dysuria and hematuria.  Musculoskeletal: Negative for back pain, gait problem and neck pain.  Skin: Negative for color change.  Neurological: Positive for dizziness and headaches.  Hematological: Negative for adenopathy.  Psychiatric/Behavioral: Negative for behavioral problems.  All other systems reviewed and are negative.     Allergies  Eggs or egg-derived products; Penicillins; Latex; and Penicillin g  Home Medications   Prior to Admission medications   Medication Sig Start Date End Date Taking? Authorizing Provider  albuterol (PROVENTIL HFA;VENTOLIN HFA) 108 (90 BASE) MCG/ACT inhaler Inhale 2 puffs into the lungs every 6 (six) hours as needed. Shortness of breath    Historical Provider, MD  cholecalciferol (VITAMIN D) 1000 UNITS tablet Take 2,000 Units by mouth daily.  Historical Provider, MD  HYDROcodone-acetaminophen (NORCO) 10-325 MG per tablet Take 1 tablet by mouth every 6 (six) hours as needed for pain (pain).    Historical Provider, MD  lisinopril-hydrochlorothiazide (PRINZIDE,ZESTORETIC) 20-12.5 MG per tablet Take 1 tablet by mouth every evening.     Historical Provider, MD  LORazepam (ATIVAN) 1 MG tablet Take 1 mg by mouth every 6 (six) hours as needed for anxiety.     Historical Provider, MD   BP 124/59  Pulse 79   Temp(Src) 97.8 F (36.6 C) (Oral)  Resp 18  Ht 5\' 7"  (1.702 m)  Wt 340 lb (154.223 kg)  BMI 53.24 kg/m2  SpO2 97%  LMP 04/17/2014 Physical Exam  Nursing note and vitals reviewed. Constitutional: She is oriented to person, place, and time. She appears well-developed and well-nourished.  HENT:  Head: Normocephalic and atraumatic.  Mouth/Throat: Oropharynx is clear and moist. No oropharyngeal exudate.  Eyes: Conjunctivae and EOM are normal. Pupils are equal, round, and reactive to light.  Neck: Normal range of motion. Neck supple.  Cardiovascular: Normal rate, regular rhythm, normal heart sounds and intact distal pulses.  Exam reveals no gallop and no friction rub.   No murmur heard. Pulmonary/Chest: Effort normal and breath sounds normal. No respiratory distress. She has no wheezes.  Abdominal: Soft. Bowel sounds are normal. There is no tenderness. There is no rebound and no guarding.  Musculoskeletal: Normal range of motion. She exhibits no edema and no tenderness.  Neurological: She is alert and oriented to person, place, and time.  alert, oriented x3 speech: normal in context and clarity memory: intact grossly cranial nerves II-XII: ptosis in left eyelid, difficulty focusing but no obvious EOM deficiency, left eye appears to drift laterally, no peripheral field deficit found motor strength: full proximally and distally no involuntary movements or tremors sensation: intact to light touch diffusely  cerebellar: finger-to-nose and heel-to-shin intact gait: able to ambulate forwards and backwards, worsening dizziness w/ ambulation w/ mild imbalance   Skin: Skin is warm and dry.  Psychiatric: She has a normal mood and affect. Her behavior is normal.    ED Course  Procedures (including critical care time) Labs Review Labs Reviewed  COMPREHENSIVE METABOLIC PANEL - Abnormal; Notable for the following:    Glucose, Bld 129 (*)    Calcium 10.6 (*)    Albumin 3.4 (*)    Alkaline  Phosphatase 146 (*)    All other components within normal limits  PROTIME-INR  APTT  CBC  DIFFERENTIAL  I-STAT TROPOININ, ED    Imaging Review Ct Head Wo Contrast  05/27/2014   CLINICAL DATA:  History of left-sided brain tumor diagnosed in 2013. Headache, dizziness, blurred vision.  EXAM: CT HEAD WITHOUT CONTRAST  TECHNIQUE: Contiguous axial images were obtained from the base of the skull through the vertex without intravenous contrast.  COMPARISON:  MRI brain 04/27/2014.  CT head 12/13/2013.  FINDINGS: Sclerosis and expansile changes in the left sphenoid and orbital apex with extension to the posterior ethmoid region. This is consistent with sphenoid meningioma and appears unchanged since prior studies. There is narrowing of the optic canal.  Ventricles and sulci appear symmetrical. No mass effect or midline shift. No abnormal extra-axial fluid collections. Gray-white matter junctions are distinct. Basal cisterns are not effaced. No evidence of acute intracranial hemorrhage. No depressed skull fractures. Mucous retention cysts in the floor of the left maxillary antrum.  IMPRESSION: Unchanged appearance of sclerosis and expansile change in the left sphenoid bone consistent with meningioma. Narrowing  of optic canal as before. No acute intracranial abnormalities.   Electronically Signed   By: Lucienne Capers M.D.   On: 05/27/2014 05:28     EKG Interpretation   Date/Time:  Wednesday May 27 2014 03:37:00 EDT Ventricular Rate:  85 PR Interval:  176 QRS Duration: 82 QT Interval:  376 QTC Calculation: 447 R Axis:   44 Text Interpretation:  Normal sinus rhythm No significant change since last  tracing Confirmed by Bernie Fobes  MD, Ivyrose Hashman ([id number]) on 05/27/2014 4:52:15 AM      MDM   Final diagnoses:  Ptosis, left eyelid  Dizziness  Headache  Visual disturbance    5:05 AM 43 y.o. female with a history of brain meningioma status post radiation in 2014 who presents with dizziness. She states  she awoke at 2 AM this morning to use the bathroom. She notes that she was very dizzy and felt like she was unable to cross her eyes. Since that time she has developed a left-sided parietal dull headache is currently 8/10 in intensity. She is afebrile and vital signs are unremarkable here. Will get screening CT of head and lab work.   Discussed case w/ Dr. Salomon Fick ([facility] at [facility]). Labs/imaging non-contrib. HA improved. Pt's sx persist, but thought to be related to meningioma althought no change in CT. Dr. Salomon Fick recommends course of steroids and close f/u with her NSU.  I have discussed the diagnosis/risks/treatment options with the patient and believe the pt to be eligible for discharge home to follow-up with Dr. Bridgett Larsson at [facility] this week. We also discussed returning to the ED immediately if new or worsening sx occur. We discussed the sx which are most concerning (e.g., worsening dizziness, fever, weakness, numbness, worsening imbalance, ataxia) that necessitate immediate return. Medications administered to the patient during their visit and any new prescriptions provided to the patient are listed below.  Medications given during this visit Medications  sodium chloride 0.9 % bolus 1,000 mL (1,000 mLs Intravenous New Bag/Given 05/27/14 0501)  metoCLOPramide (REGLAN) injection 5 mg (5 mg Intravenous Given 05/27/14 0501)  diphenhydrAMINE (BENADRYL) injection 12.5 mg (12.5 mg Intravenous Given 05/27/14 0500)  methylPREDNISolone sodium succinate (SOLU-MEDROL) 125 mg/2 mL injection 125 mg (125 mg Intravenous Given 05/27/14 0642)    Discharge Medication List as of 05/27/2014  6:53 AM    START taking these medications   Details  predniSONE (DELTASONE) 50 MG tablet Take 1 tablet daily for the next 4 days., Print           Blanchard Kelch, MD 05/27/14 [phone]

## 2014-05-27 NOTE — ED Notes (Signed)
Pt states she awoke with lightheadedness, and trouble looking to the left.  Pt states she was having trouble looking to the right but it has resolved.  Pt states she has a tumor on the left side of her brain that was diagnosed in 2013.

## 2014-05-27 NOTE — ED Notes (Signed)
Patient here with complaint of left eye gaze difficulty, dizziness, and nauseated.

## 2014-06-09 ENCOUNTER — Ambulatory Visit: Payer: Medicaid Other | Admitting: Internal Medicine

## 2014-06-25 ENCOUNTER — Ambulatory Visit (INDEPENDENT_AMBULATORY_CARE_PROVIDER_SITE_OTHER): Payer: Medicaid Other | Admitting: Internal Medicine

## 2014-06-25 ENCOUNTER — Encounter: Payer: Self-pay | Admitting: Internal Medicine

## 2014-06-25 DIAGNOSIS — E559 Vitamin D deficiency, unspecified: Secondary | ICD-10-CM | POA: Insufficient documentation

## 2014-06-25 NOTE — Progress Notes (Addendum)
Patient ID: Carol Ortega, female   DOB: 07-28-1971, 43 y.o.   MRN: [id number]   HPI  Carol Ortega is a 43 y.o.-year-old female, initially referred by Dr Tresa Moore (her urologist), for evaluation for hypercalcemia. PCP: Dr Eldridge Abrahams. Last visit 2 mo ago.  She was on Prednisone 50 mg for 7 days in 05/2014. Now off.  Reviewed hx: Pt was referred for hypercalcemia during investigation of nephrolithiasis. She tells me she was found to have a high calcium level in the past by PCP.   She has had L nephrolithiasis 2x a year since 2000 >> many visits to the ED. She has had lithotripsy and then extractions. Some of the stones she passed (12 mm). She has had a stent placed, now taken out.   I reviewed pt's pertinent labs: Lab Results  Component Value Date   PTH 165.2* 02/09/2014   CALCIUM 10.6* 05/27/2014   CALCIUM 10.4 04/27/2014   CALCIUM 10.4 02/09/2014   CALCIUM 10.7* 01/25/2014   CALCIUM 10.7* 01/12/2014   CALCIUM 9.8 12/21/2013   CALCIUM 10.3 01/16/2013   CALCIUM 10.9* 10/01/2012   CALCIUM 10.7* 07/20/2012   CALCIUM 11.1* 07/23/2011   She has a h/o vitamin D deficiency few years ago >> started on the high dose Ergocalciferol. No vit D supplements at last visit, but a level was low, at 14, so we started 2000 IU vit D OTC and we planned to recheck this and the PTH in 2 months (at this visit). She had the vit D checked by PCP recently >> 9.5 (we asked for records at the time of the appt - the results were communicated by phone - fax is pending)  No h/o osteoporosis or fractures.  No h/o CKD. Last BUN/Cr: Lab Results  Component Value Date   BUN 10 05/27/2014   CREATININE 0.60 05/27/2014   I reviewed her chart and she also has a history of left sphenoid wing meningioma >> had 32 days of radiation tx. She also has a h/o HTN and was on Lisinopril-HCTZ, but not recently. She recently restarted Lisinopril only (2 days ago).   I reviewed pt's medications, allergies, PMH, social hx, family hx and no  changes required, except as mentioned above.  ROS: Constitutional: + weight gain, no fatigue, no subjective hyperthermia, + poor sleep Eyes: + blurry vision, no xerophthalmia ENT: no sore throat, no nodules palpated in throat, no dysphagia/odynophagia,+ hoarseness Cardiovascular: no CP/SOB/palpitations/leg swelling Respiratory: no cough/SOB Gastrointestinal: no N/V/C/heartburn Musculoskeletal: no muscle/+ joint aches Skin: no rashes Neurological: no tremors/numbness/tingling/dizziness, + HA  PE: BP 112/76  Pulse 86  Temp(Src) 97.7 F (36.5 C) (Oral)  Resp 12  Wt 364 lb (165.109 kg)  SpO2 97% Wt Readings from Last 3 Encounters:  06/25/14 364 lb (165.109 kg)  05/27/14 340 lb (154.223 kg)  02/09/14 357 lb (161.934 kg)   Constitutional: overweight, in NAD. No kyphosis. Eyes: PERRLA, EOMI, no exophthalmos ENT: moist mucous membranes, no thyromegaly, no cervical lymphadenopathy Cardiovascular: RRR, No MRG Respiratory: CTA B Gastrointestinal: abdomen soft, NT, ND, BS+ Musculoskeletal: no deformities, strength intact in all 4 Skin: moist, warm, no rashes Neurological: no tremor with outstretched hands, DTR normal in all 4  Assessment: 1. Hypercalcemia/hyperparathyroidism  Records from [facility] (Dr Tresa Moore) - 01/29/2014: Patient with history of: - left nephrolithiasis - underwent SWL 2015 [place], subsequently presenting with refractory colic, and bacteriuria in [place] and managed with a stent 01/13/2014, followed by definitive ureteroscopy for large left UPJ stone - bacteriuria UTI - most  recent positive culture with Escherichia coli 12/2013. Most recent urine culture on 01/25/2014, negative. - Investigation for nephrolithiasis: Uric acid 5.4 (2.4-7) BMP normal, except glucose 102 and calcium 10.7 (8.4-10.5); her GFR was normal at >89. PTH 190.8 (14-72)  Component     Latest Ref Rng 02/09/2014  Vitamin D 1, 25 (OH) Total     18 - 72 pg/mL 129 (H)  Vitamin D3 1, 25  (OH)      100  Vitamin D2 1, 25 (OH)      29  PTH     14.0 - 72.0 pg/mL 165.2 (H)  Calcium     8.4 - 10.5 mg/dL 10.4  Vit D, 25-Hydroxy     30 - 89 ng/mL 14 (L)  Phosphorus     2.3 - 4.6 mg/dL 2.9   2. Vit D def - see above  Plan: 1. Patient has a h/o elevated calcium, with the highest level being at 11.1. PTH levels also high. Vit D very low >> we are trying to replete it, however, it is very likely that she does have a parathyroid adenoma based on the high PTH level with a borderline/high calcium, however, we ned to recheck the tests when vit D normal.  She does have complications from hypercalcemia - nephrolithiasis (per problem list in chart, this was with uric acid). No osteoporosis, no fractures. No abdominal pain, depression, bone pain. - we again discussed with the patient about the physiology of calcium and parathyroid hormone - criteria for parathyroid surgery are:  Increased calcium by more than 1 mg/dL above the upper limit of normal  Kidney ds.  Osteoporosis  Age <43 years old - she does meet the criteria based on age. - will continue vitamin D but increase to 4000 units daily (we cannot use a higher dose to avoid hypercalcemia), then check a  24 h urinary calcium/creatinine ratio  - If the tests indicate a parathyroid adenoma, I will refer her to surgery. - I will see the patient back in 4 months, but RTC for labs in 2 months (only vitamin D)  2. Vit D deficiency - vit D still low per check by PCP - will continue vitamin D OTC - increase to 4000 units daily   Received fax from PCP: - Magnesium 2.0 - vitamin D 9.5, prev. 12, 4 mo ago - TSH 2.54 - BMP: normal, except:  Aphos 131 (32-98), prev. 96, 4 mo ago AST 14 (15-41) Ca 11.2

## 2014-06-25 NOTE — Patient Instructions (Signed)
Please increase the vitamin D to 4000 units daily. Please come back for labs in 2 months. Please come back for a follow-up appointment in 4 months.

## 2014-08-30 ENCOUNTER — Emergency Department (HOSPITAL_COMMUNITY)
Admission: EM | Admit: 2014-08-30 | Discharge: 2014-08-30 | Discharge status: Against Medical Advice | Payer: Medicaid Other | Attending: Emergency Medicine | Admitting: Emergency Medicine

## 2014-08-30 ENCOUNTER — Encounter (HOSPITAL_COMMUNITY): Payer: Self-pay | Admitting: Emergency Medicine

## 2014-08-30 DIAGNOSIS — E669 Obesity, unspecified: Secondary | ICD-10-CM | POA: Diagnosis not present

## 2014-08-30 DIAGNOSIS — R109 Unspecified abdominal pain: Secondary | ICD-10-CM | POA: Insufficient documentation

## 2014-08-30 DIAGNOSIS — R319 Hematuria, unspecified: Secondary | ICD-10-CM | POA: Diagnosis not present

## 2014-08-30 DIAGNOSIS — J029 Acute pharyngitis, unspecified: Secondary | ICD-10-CM | POA: Insufficient documentation

## 2014-08-30 DIAGNOSIS — I10 Essential (primary) hypertension: Secondary | ICD-10-CM | POA: Diagnosis not present

## 2014-08-30 DIAGNOSIS — R110 Nausea: Secondary | ICD-10-CM | POA: Insufficient documentation

## 2014-08-30 LAB — CBC WITH DIFFERENTIAL/PLATELET
BASOS PCT: 1 % (ref 0–1)
Basophils Absolute: 0.1 10*3/uL (ref 0.0–0.1)
Eosinophils Absolute: 0.3 10*3/uL (ref 0.0–0.7)
Eosinophils Relative: 4 % (ref 0–5)
HCT: 39.3 % (ref 36.0–46.0)
Hemoglobin: 12.8 g/dL (ref 12.0–15.0)
LYMPHS PCT: 36 % (ref 12–46)
Lymphs Abs: 2.3 10*3/uL (ref 0.7–4.0)
MCH: 27.9 pg (ref 26.0–34.0)
MCHC: 32.6 g/dL (ref 30.0–36.0)
MCV: 85.8 fL (ref 78.0–100.0)
MONOS PCT: 9 % (ref 3–12)
Monocytes Absolute: 0.6 10*3/uL (ref 0.1–1.0)
Neutro Abs: 3.2 10*3/uL (ref 1.7–7.7)
Neutrophils Relative %: 50 % (ref 43–77)
Platelets: 323 10*3/uL (ref 150–400)
RBC: 4.58 MIL/uL (ref 3.87–5.11)
RDW: 14 % (ref 11.5–15.5)
WBC: 6.4 10*3/uL (ref 4.0–10.5)

## 2014-08-30 LAB — COMPREHENSIVE METABOLIC PANEL
ALT: 13 U/L (ref 0–35)
ANION GAP: 11 (ref 5–15)
AST: 15 U/L (ref 0–37)
Albumin: 3.2 g/dL — ABNORMAL LOW (ref 3.5–5.2)
Alkaline Phosphatase: 133 U/L — ABNORMAL HIGH (ref 39–117)
BILIRUBIN TOTAL: 0.3 mg/dL (ref 0.3–1.2)
BUN: 10 mg/dL (ref 6–23)
CO2: 27 meq/L (ref 19–32)
CREATININE: 0.91 mg/dL (ref 0.50–1.10)
Calcium: 10.6 mg/dL — ABNORMAL HIGH (ref 8.4–10.5)
Chloride: 105 mEq/L (ref 96–112)
GFR, EST AFRICAN AMERICAN: 88 mL/min — AB (ref >90)
GFR, EST NON AFRICAN AMERICAN: 76 mL/min — AB (ref >90)
GLUCOSE: 102 mg/dL — AB (ref 70–99)
Potassium: 4.4 mEq/L (ref 3.7–5.3)
Sodium: 143 mEq/L (ref 137–147)
Total Protein: 7.1 g/dL (ref 6.0–8.3)

## 2014-08-30 LAB — LIPASE, BLOOD: LIPASE: 31 U/L (ref 11–59)

## 2014-08-30 NOTE — ED Notes (Signed)
C/o L flank pain, L sided abd pain, and nausea x 2 weeks.  Reports hematuria x 2 days.  Also c/o sore throat x 1 week.  History of kidney stones.

## 2014-11-05 ENCOUNTER — Encounter: Payer: Self-pay | Admitting: Internal Medicine

## 2014-11-05 ENCOUNTER — Ambulatory Visit (INDEPENDENT_AMBULATORY_CARE_PROVIDER_SITE_OTHER): Payer: Medicaid Other | Admitting: Internal Medicine

## 2014-11-05 DIAGNOSIS — E559 Vitamin D deficiency, unspecified: Secondary | ICD-10-CM

## 2014-11-05 NOTE — Progress Notes (Addendum)
Patient ID: Carol Ortega, female   DOB: [birth year], 43 y.o.   MRN: [id number]   HPI  Carol Ortega is a 43 y.o.-year-old female, initially referred by Dr Tresa Moore (her urologist), for evaluation for hypercalcemia. PCP: Dr Eldridge Abrahams. Last visit 4 mo ago.  Reviewed hx: Pt was referred for hypercalcemia during investigation of nephrolithiasis. She tells me she was found to have a high calcium level in the past by PCP.   She has had L nephrolithiasis 2x a year since 2000 >> many visits to the ED. She has had lithotripsy and then extractions. Some of the stones she passed (12 mm). She has had a stent placed, now taken out.  She had a new kidney stone in 04/2014.    I reviewed pt's pertinent labs: Lab Results  Component Value Date   PTH 165.2* 02/09/2014   CALCIUM 10.6* 08/30/2014   CALCIUM 10.6* 05/27/2014   CALCIUM 10.4 04/27/2014   CALCIUM 10.4 02/09/2014   CALCIUM 10.7* 01/25/2014   CALCIUM 10.7* 01/12/2014   CALCIUM 9.8 12/21/2013   CALCIUM 10.3 01/16/2013   CALCIUM 10.9* 10/01/2012   CALCIUM 10.7* 07/20/2012   She has a h/o vitamin D deficiency few years ago >> started on the high dose Ergocalciferol. No vit D supplements at last visit, but a level was low, at 14, so we started 2000 IU vit D OTC and we planned to recheck this and the PTH in 2 months (at this visit).   She had the vit D checked by PCP recently >> 9.5 >> we increased her vit D supplementation to 4000 units daily and decided to call her back to recheck the level and PTH/Ca.  No h/o osteoporosis or fractures.  No h/o CKD. Last BUN/Cr: Lab Results  Component Value Date   BUN 10 08/30/2014   CREATININE 0.91 08/30/2014   I reviewed her chart and she also has a history of left sphenoid wing meningioma >> had 32 days of radiation tx. She also has a h/o HTN and was on Lisinopril-HCTZ, but not recently. She is on Lisinopril only now.  I reviewed pt's medications, allergies, PMH, social hx, family hx and no changes  required, except as mentioned above.  ROS: Constitutional: + weight gain, + fatigue, no subjective hyperthermia Eyes: + blurry vision, no xerophthalmia ENT: no sore throat, no nodules palpated in throat, no dysphagia/odynophagia,+ hoarseness Cardiovascular: no CP/SOB/palpitations/leg swelling Respiratory: no cough/SOB Gastrointestinal: + N/no V/C/heartburn Musculoskeletal: + muscle/+ joint aches Skin: no rashes Neurological: no tremors/numbness/tingling/dizziness, + HA + Irregular menstrual cycles  PE: BP 138/98 mmHg  Pulse 87  Temp(Src) 98 F (36.7 C) (Oral)  Resp 12  Wt 370 lb (167.831 kg)  SpO2 96%  LMP 08/09/2014 Wt Readings from Last 3 Encounters:  11/05/14 370 lb (167.831 kg)  08/30/14 365 lb 6.4 oz (165.744 kg)  06/25/14 364 lb (165.109 kg)   Constitutional: overweight, in NAD. No kyphosis. Eyes: PERRLA, EOMI, no exophthalmos ENT: moist mucous membranes, no thyromegaly, no cervical lymphadenopathy Cardiovascular: RRR, No MRG Respiratory: CTA B Gastrointestinal: abdomen soft, NT, ND, BS+ Musculoskeletal: no deformities, strength intact in all 4 Skin: moist, warm, no rashes Neurological: no tremor with outstretched hands, DTR normal in all 4  Assessment: 1. Hypercalcemia/hyperparathyroidism  Records from [facility] (Dr Tresa Moore) - 01/29/2014: Patient with history of: - left nephrolithiasis - underwent SWL [place], subsequently presenting with refractory colic, and bacteriuria in [place] and managed with a stent 01/13/2014, followed by definitive ureteroscopy for large left UPJ stone -  bacteriuria UTI - most recent positive culture with Escherichia coli 12/2013. Most recent urine culture on 01/25/2014, negative. - Investigation for nephrolithiasis: Uric acid 5.4 (2.4-7) BMP normal, except glucose 102 and calcium 10.7 (8.4-10.5); her GFR was normal at >89. PTH 190.8 (14-72)  Component     Latest Ref Rng 02/09/2014  Vitamin D 1, 25 (OH) Total     18 - 72  pg/mL 129 (H)  Vitamin D3 1, 25 (OH)      100  Vitamin D2 1, 25 (OH)      29  PTH     14.0 - 72.0 pg/mL 165.2 (H)  Calcium     8.4 - 10.5 mg/dL 10.4  Vit D, 25-Hydroxy     30 - 89 ng/mL 14 (L)  Phosphorus     2.3 - 4.6 mg/dL 2.9   06/2014: - Magnesium 2.0 - vitamin D 9.5, prev. 12, 4 mo ago - TSH 2.54 - BMP: normal, except:  Aphos 131 (32-98), prev. 96, 4 mo ago AST 14 (15-41) Ca 11.2  2. Vit D def - see above  Plan: 1. Patient has a h/o elevated calcium, with the highest level being at 11.1. PTH levels also high. Vit D very low >> we are trying to repleted without using high dose ergocalciferol however the last value that she had was very low, and 9.5. At that point we increase the vitamin D from 2000 to 4000 units daily. We will need to repeat a vitamin D level today. - It is very likely that she does have a parathyroid adenoma based on the high PTH level with a borderline/high calcium, however, we need to recheck the tests when vit D normal. I can send her to surgery them before vitamin D normalizes, however, but we discussed that her vitamin D needs to be close to normal at the time of the surgery, otherwise, she would be at risk for hypocalcemia postop. She does have complications from hypercalcemia - nephrolithiasis (per problem list in chart, this was with uric acid). No osteoporosis, no fractures. No abdominal pain, depression, bone pain. - we again discussed with the patient about the physiology of calcium and parathyroid hormone - criteria for parathyroid surgery are:  Increased calcium by more than 1 mg/dL above the upper limit of normal  Kidney ds.  Osteoporosis  Age <2 years old - she does meet the criteria based on age. - will continue vitamin D 4000 units daily for now, but we might need a higher dose if this is still low. When vitamin D normalizes, will need to check a 24 h urinary calcium/creatinine ratio. - If the tests indicate a parathyroid adenoma, I will  refer her to surgery. - We'll recheck the PTH and calcium today ([facility])  Return in about 4 months (around 03/06/2015).  2. Vit D deficiency - vit D still low per check by PCP in 06/2014 >> we increased dose to 4000 units daily >> recheck level today Orders Placed This Encounter  Procedures  . Vitamin D (25 hydroxy)  . PTH, Intact and Calcium   Pt did not stop to the lab >> we called her to return.   12/30/2014: Received labs from [place], from 12/24/2014:  - calcium 11.3 (8.4-10.5) - GFR 105 - iPTH 32 ([facility])  - vitamin D 25 HO 6.9 (30-100) - lower than before!  Vitamin D lower than before. iPTH not high, but higher than expected for the 11.3 calcium. 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   I will check with her if she is taking the 4000 units of vitamin D daily. If not >> restart. If she does, then will increase to 50,000 units weekly for 8 weeks, then check vit D again and iPTH. Next appt is on 03/08/2015 >> will recheck these at that time.

## 2014-11-05 NOTE — Patient Instructions (Addendum)
Please stop at the lab. If the vitamin D is normal, we will need the urine test. Please come back for a follow-up appointment in 4 months.

## 2014-11-25 ENCOUNTER — Encounter: Payer: Self-pay | Admitting: *Deleted

## 2014-12-16 ENCOUNTER — Emergency Department (HOSPITAL_COMMUNITY)
Admission: EM | Admit: 2014-12-16 | Discharge: 2014-12-17 | Disposition: A | Discharge status: Home / Self Care | Payer: Medicaid Other | Attending: Emergency Medicine | Admitting: Emergency Medicine

## 2014-12-16 ENCOUNTER — Encounter (HOSPITAL_COMMUNITY): Payer: Self-pay | Admitting: Adult Health

## 2014-12-16 ENCOUNTER — Emergency Department (HOSPITAL_COMMUNITY): Payer: Medicaid Other

## 2014-12-16 DIAGNOSIS — N200 Calculus of kidney: Secondary | ICD-10-CM

## 2014-12-16 DIAGNOSIS — R109 Unspecified abdominal pain: Secondary | ICD-10-CM | POA: Diagnosis present

## 2014-12-16 DIAGNOSIS — Z79899 Other long term (current) drug therapy: Secondary | ICD-10-CM | POA: Diagnosis not present

## 2014-12-16 DIAGNOSIS — E669 Obesity, unspecified: Secondary | ICD-10-CM | POA: Insufficient documentation

## 2014-12-16 DIAGNOSIS — Z880 Allergy status to penicillin: Secondary | ICD-10-CM | POA: Insufficient documentation

## 2014-12-16 DIAGNOSIS — Z91040 Latex allergy status: Secondary | ICD-10-CM | POA: Diagnosis not present

## 2014-12-16 DIAGNOSIS — Z3202 Encounter for pregnancy test, result negative: Secondary | ICD-10-CM | POA: Diagnosis not present

## 2014-12-16 DIAGNOSIS — R112 Nausea with vomiting, unspecified: Secondary | ICD-10-CM | POA: Diagnosis not present

## 2014-12-16 DIAGNOSIS — Z87440 Personal history of urinary (tract) infections: Secondary | ICD-10-CM | POA: Insufficient documentation

## 2014-12-16 DIAGNOSIS — Z86711 Personal history of pulmonary embolism: Secondary | ICD-10-CM | POA: Diagnosis not present

## 2014-12-16 DIAGNOSIS — Z85841 Personal history of malignant neoplasm of brain: Secondary | ICD-10-CM | POA: Diagnosis not present

## 2014-12-16 DIAGNOSIS — Z9049 Acquired absence of other specified parts of digestive tract: Secondary | ICD-10-CM | POA: Diagnosis not present

## 2014-12-16 DIAGNOSIS — Z9851 Tubal ligation status: Secondary | ICD-10-CM | POA: Diagnosis not present

## 2014-12-16 LAB — WET PREP, GENITAL
Trich, Wet Prep: NONE SEEN
Yeast Wet Prep HPF POC: NONE SEEN

## 2014-12-16 LAB — URINALYSIS, ROUTINE W REFLEX MICROSCOPIC
Bilirubin Urine: NEGATIVE
Glucose, UA: NEGATIVE mg/dL
Hgb urine dipstick: NEGATIVE
Ketones, ur: NEGATIVE mg/dL
Leukocytes, UA: NEGATIVE
Nitrite: NEGATIVE
Protein, ur: NEGATIVE mg/dL
Specific Gravity, Urine: 1.02 (ref 1.005–1.030)
Urobilinogen, UA: 1 mg/dL (ref 0.0–1.0)
pH: 7 (ref 5.0–8.0)

## 2014-12-16 LAB — POC URINE PREG, ED: Preg Test, Ur: NEGATIVE

## 2014-12-16 MED ORDER — OXYCODONE-ACETAMINOPHEN 5-325 MG PO TABS: 1.0000 | ORAL_TABLET | ORAL | Status: DC | PRN

## 2014-12-16 MED ORDER — ONDANSETRON HCL 4 MG PO TABS: 4.0000 mg | ORAL_TABLET | Freq: Four times a day (QID) | ORAL | Status: DC

## 2014-12-16 MED ORDER — OXYCODONE-ACETAMINOPHEN 5-325 MG PO TABS
1.0000 | ORAL_TABLET | Freq: Once | ORAL | Status: AC
  Administered 2014-12-16: 1 via ORAL
  Filled 2014-12-16: qty 1

## 2014-12-16 MED ORDER — TAMSULOSIN HCL 0.4 MG PO CAPS: 0.4000 mg | ORAL_CAPSULE | Freq: Two times a day (BID) | ORAL | Status: DC

## 2014-12-16 MED ORDER — ONDANSETRON HCL 4 MG/2ML IJ SOLN
4.0000 mg | Freq: Once | INTRAMUSCULAR | Status: AC
  Administered 2014-12-16: 4 mg via INTRAVENOUS
  Filled 2014-12-16: qty 2

## 2014-12-16 MED ORDER — ONDANSETRON 4 MG PO TBDP
8.0000 mg | ORAL_TABLET | Freq: Once | ORAL | Status: AC
  Administered 2014-12-16: 8 mg via ORAL
  Filled 2014-12-16: qty 2

## 2014-12-16 MED ORDER — MORPHINE SULFATE 4 MG/ML IJ SOLN
4.0000 mg | Freq: Once | INTRAMUSCULAR | Status: AC
  Administered 2014-12-16: 4 mg via INTRAVENOUS
  Filled 2014-12-16: qty 1

## 2014-12-16 NOTE — ED Provider Notes (Signed)
CSN: [id number]     Arrival date & time 12/16/14  1748 History   First MD Initiated Contact with Patient 12/16/14 2006     Chief Complaint  Patient presents with  . Flank Pain     (Consider location/radiation/quality/duration/timing/severity/associated sxs/prior Treatment) HPI Carol Ortega is a 43 y.o. female with a history of recurrent nephrolithiasis comes in for evaluation of left flank pain. She states approximately 3 days ago she began to experience intermittent left back pain and left groin pain that she characterizes as a "sudden sharp, stabbing sensation that comes and goes". She reports this pain is the same pain she experiences with her recurrent kidney stones. She reports her urine has been much darker in color, and has a foul odor. She denies dysuria or hematuria. She reports associated nausea without vomiting. She says she had a temperature today of 101 that came down to 98 in the ED without treatment. She is also concerned that 2 days ago she experienced some brown vaginal discharge, but has not had a menstrual cycle since August. Denies any other symptoms, no headaches, chest pain, shortness of breath, other abdominal pain, numbness or weakness  Past Medical History  Diagnosis Date  . Kidney stone   . UTI (urinary tract infection)   . Obesity   . Brain tumor   . Radiation     Brain tumor  . Hypertension   . Pulmonary emboli 2011  . Complication of anesthesia 1999    lung collapse after surgery for gallbladder   Past Surgical History  Procedure Laterality Date  . Kidney stone removal    . Lithotripsy    . Cystoscopy w/ ureteroscopy    . Cystoscopy with stent placement Left 01/12/2014    Procedure: CYSTOSCOPY WITH STENT PLACEMENT left retrograde;  Surgeon: Irine Seal, MD;  Location: [facility];  Service: Urology;  Laterality: Left;  . Surgery for,ectopic pregnancy  2011     1 fallopian tube rupture  . Tubal ligation  2011  . Cholecystectomy  1999  . Cystoscopy with  retrograde pyelogram, ureteroscopy and stent placement Left 01/20/2014    Procedure: LEFT URETEROSCOPY WITH STENT PLACEMENT;  Surgeon: Irine Seal, MD;  Location: [facility];  Service: Urology;  Laterality: Left;  . Holmium laser application Left 05/20/9527    Procedure: HOLMIUM LASER APPLICATION;  Surgeon: Irine Seal, MD;  Location: [facility];  Service: Urology;  Laterality: Left;   History reviewed. No pertinent family history. History  Substance Use Topics  . Smoking status: Never Smoker   . Smokeless tobacco: Never Used  . Alcohol Use: Yes     Comment: social only   OB History    No data available     Review of Systems A 10 point review of systems was completed and was negative except for pertinent positives and negatives as mentioned in the history of present illness     Allergies  Eggs or egg-derived products; Penicillins; Latex; and Penicillin g  Home Medications   Prior to Admission medications   Medication Sig Start Date End Date Taking? Authorizing Provider  albuterol (PROVENTIL HFA;VENTOLIN HFA) 108 (90 BASE) MCG/ACT inhaler Inhale 2 puffs into the lungs every 6 (six) hours as needed. Shortness of breath   Yes Historical Provider, MD  cholecalciferol (VITAMIN D) 1000 UNITS tablet Take 1,000 Units by mouth 4 (four) times daily.    Yes Historical Provider, MD  DULoxetine (CYMBALTA) 30 MG capsule Take 60 mg by mouth 3 (three) times daily. Patient states she  takes two three times a day per patient   Yes Historical Provider, MD  HYDROcodone-acetaminophen (NORCO) 10-325 MG per tablet Take 1 tablet by mouth every 6 (six) hours as needed for pain (pain).   Yes Historical Provider, MD  lisinopril-hydrochlorothiazide (PRINZIDE,ZESTORETIC) 20-12.5 MG per tablet Take 1 tablet by mouth every evening.    Yes Historical Provider, MD  LORazepam (ATIVAN) 1 MG tablet Take 1 mg by mouth every 6 (six) hours as needed for anxiety.    Yes Historical Provider, MD  ondansetron (ZOFRAN) 4 MG tablet Take 1  tablet (4 mg total) by mouth every 6 (six) hours. 12/16/14   Verl Dicker, PA-C  oxyCODONE-acetaminophen (PERCOCET) 5-325 MG per tablet Take 1-2 tablets by mouth every 4 (four) hours as needed. 12/16/14   Viona Gilmore Kerly Rigsbee, PA-C  predniSONE (DELTASONE) 50 MG tablet Take 1 tablet daily for the next 4 days. Patient not taking: Reported on 12/16/2014 05/28/14   Pamella Pert, MD  tamsulosin (FLOMAX) 0.4 MG CAPS capsule Take 1 capsule (0.4 mg total) by mouth 2 (two) times daily. 12/16/14   Viona Gilmore Ryheem Jay, PA-C   BP 128/95 mmHg  Pulse 88  Temp(Src) 98 F (36.7 C) (Oral)  Resp 20  SpO2 97%  LMP 07/18/2014 (Approximate) Physical Exam  Constitutional: She is oriented to person, place, and time. She appears well-developed and well-nourished.  Morbidly obese. Awake, nontoxic in appearance  HENT:  Head: Normocephalic and atraumatic.  Mouth/Throat: Oropharynx is clear and moist.  Eyes: Conjunctivae are normal. Pupils are equal, round, and reactive to light. Right eye exhibits no discharge. Left eye exhibits no discharge. No scleral icterus.  Neck: Normal range of motion. Neck supple.  Cardiovascular: Normal rate, regular rhythm and normal heart sounds.   Pulmonary/Chest: Effort normal and breath sounds normal. No respiratory distress. She has no wheezes. She has no rales.  Abdominal: Soft. There is no tenderness.  Tenderness over left flank with some guarding. Abdomen is otherwise soft, nondistended and nontender. No other obvious lesions or deformities appreciated. There is mild CVA tenderness on the left side  Musculoskeletal: She exhibits no tenderness.  Neurological: She is alert and oriented to person, place, and time.  Cranial Nerves II-XII grossly intact  Skin: Skin is warm and dry. No rash noted.  Psychiatric: She has a normal mood and affect.  Nursing note and vitals reviewed.   ED Course  Procedures (including critical care time) Labs Review Labs Reviewed  WET PREP,  GENITAL - Abnormal; Notable for the following:    Clue Cells Wet Prep HPF POC FEW (*)    WBC, Wet Prep HPF POC FEW (*)    All other components within normal limits  URINALYSIS, ROUTINE W REFLEX MICROSCOPIC - Abnormal; Notable for the following:    APPearance HAZY (*)    All other components within normal limits  GC/CHLAMYDIA PROBE AMP  HIV ANTIBODY (ROUTINE TESTING)  POC URINE PREG, ED    Imaging Review Ct Renal Stone Study  12/16/2014   CLINICAL DATA:  LEFT flank and lower abdominal pain, similar symptoms with prior kidney stones. History cystoscopy and stent placement January 2015.  EXAM: CT ABDOMEN AND PELVIS WITHOUT CONTRAST  TECHNIQUE: Multidetector CT imaging of the abdomen and pelvis was performed following the standard protocol without IV contrast.  COMPARISON:  CT of the abdomen and pelvis January 12, 2014  FINDINGS: Large body habitus results in overall noisy image quality.  LUNG BASES: Included view of the lung bases are clear. The visualized  heart and pericardium are unremarkable.  KIDNEYS/BLADDER: Kidneys are orthotopic, demonstrating normal size and morphology. Multiple nephrolithiasis: 5 mm RIGHT lower pole (new), punctate RIGHT lower pole, 5 mm LEFT upper pole, 2 mm LEFT upper pole, punctate LEFT lower pole. No hydronephrosis. Urinary bladder is well distended, harboring no intravesicular calculi.  SOLID ORGANS: The liver, spleen, pancreas and adrenal glands are unremarkable for this non-contrast examination. Status post cholecystectomy.  GASTROINTESTINAL TRACT: The stomach, small and large bowel are normal in course and caliber without inflammatory changes, the sensitivity may be decreased by lack of enteric contrast. Normal appendix.  PERITONEUM/RETROPERITONEUM: No intraperitoneal free fluid nor free air. Aortoiliac vessels are normal in course and caliber, mild calcific atherosclerosis. No lymphadenopathy by CT size criteria. Internal reproductive organs are nonsuspicious, LEFT  adnexal punctate calcifications, present previously.  SOFT TISSUES/ OSSEOUS STRUCTURES: Nonsuspicious. Moderate sacroiliac osteoarthrosis. Rectus abdominis diastases. Moderate L4-5 broad-based disc osteophyte complex resulting in moderate neural foraminal narrowing.  IMPRESSION: Bilateral nonobstructing nephrolithiasis, measuring up to 5 mm.  No acute intra-abdominal or pelvic process.   Electronically Signed   By: Elon Alas   On: 12/16/2014 23:14     EKG Interpretation None     Meds given in ED:  Medications  oxyCODONE-acetaminophen (PERCOCET/ROXICET) 5-325 MG per tablet 1 tablet (1 tablet Oral Given 12/16/14 1820)  ondansetron (ZOFRAN-ODT) disintegrating tablet 8 mg (8 mg Oral Given 12/16/14 1819)  oxyCODONE-acetaminophen (PERCOCET/ROXICET) 5-325 MG per tablet 1 tablet (1 tablet Oral Given 12/16/14 2058)  morphine 4 MG/ML injection 4 mg (4 mg Intravenous Given 12/16/14 2307)  ondansetron (ZOFRAN) injection 4 mg (4 mg Intravenous Given 12/16/14 2302)    Discharge Medication List as of 12/16/2014 11:59 PM    START taking these medications   Details  ondansetron (ZOFRAN) 4 MG tablet Take 1 tablet (4 mg total) by mouth every 6 (six) hours., Starting 12/16/2014, Until Discontinued, Print    oxyCODONE-acetaminophen (PERCOCET) 5-325 MG per tablet Take 1-2 tablets by mouth every 4 (four) hours as needed., Starting 12/16/2014, Until Discontinued, Print    tamsulosin (FLOMAX) 0.4 MG CAPS capsule Take 1 capsule (0.4 mg total) by mouth 2 (two) times daily., Starting 12/16/2014, Until Discontinued, Print       Filed Vitals:   12/16/14 2030 12/16/14 2200 12/16/14 2230 12/17/14 0010  BP: 147/109 127/79 123/100 128/95  Pulse: 88 82 94 88  Temp:      TempSrc:      Resp: 20 12 22 20   SpO2: 97% 99% 98% 97%    MDM  Carol Ortega is a 43 y.o. female with recurrent nephrolithiasis comes in for evaluation of left flank pain. Three-day history of sharp stabbing, intermittent back and  left groin discomfort. States pain is similar to previous kidney stone pain she has had in the past.   Vitals stable - WNL -afebrile Pt resting comfortably in ED. pain much improved since being in ED. PE--left CVA tenderness. Mild tenderness to palpation and left groin. Otherwise benign abdominal exam. Labwork--noncontributory. Imaging--CT shows bilateral nonobstructing nephrolithiasis, measuring up to 5 mm DDX--discussed symptomatic treatment at home with antinausea and pain medicines. No evidence of UTI or infection. Provided referral to urology for further evaluation and management of her symptoms  Discussed f/u with PCP and return precautions, pt very amenable to plan. Patient stable, in good condition and is appropriate for discharge Prior to patient discharge, I discussed and reviewed this case with Dr. Wilson Singer   Final diagnoses:  Acute left flank pain  Nephrolithiasis  Viona Gilmore [place], PA-C 12/17/14 0121  Virgel Manifold, MD 12/17/14 [id number]

## 2014-12-16 NOTE — ED Notes (Signed)
Pt transported to radiology.

## 2014-12-16 NOTE — ED Notes (Signed)
Pelvic perfomred specs collected

## 2014-12-16 NOTE — ED Notes (Signed)
Lt flnak and and pain .  Hx of kidney stone..  No period since august.  Brown spotting a few days ago

## 2014-12-16 NOTE — ED Notes (Signed)
Presents with left flank pain and lower abdominal pain -"this feels just like the kidney stones I get" pt is tearful, pain rated 10/10

## 2014-12-16 NOTE — ED Notes (Signed)
Med for pain and nausea given iv

## 2014-12-17 LAB — HIV ANTIBODY (ROUTINE TESTING W REFLEX): HIV 1&2 Ab, 4th Generation: NONREACTIVE

## 2014-12-20 LAB — GC/CHLAMYDIA PROBE AMP
CT Probe RNA: NEGATIVE
GC Probe RNA: NEGATIVE

## 2014-12-30 ENCOUNTER — Encounter: Payer: Self-pay | Admitting: *Deleted

## 2014-12-31 ENCOUNTER — Telehealth: Payer: Self-pay | Admitting: *Deleted

## 2014-12-31 NOTE — Telephone Encounter (Signed)
Called pt and lvm advising her per Dr Arman Filter note. Letter sent as well.   Received labs from [facility], from 12/24/2014:  - calcium high, at 11.3 (8.4-10.5)  - GFR 105  - iPTH 32 ([facility])  - vitamin D 25 HO 6.9 (30-100)   Vitamin D lower than before. iPTH not high, but higher than expected for the 11.3 calcium. 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    Please check with her whether she is taking the 4000 units of vitamin D daily recommended before. If not >> restart. If she does, then will increase to 50,000 units weekly for 8 weeks, then check vit D again and iPTH. Next appt is on 03/08/2015 >> will recheck these at that time. Lets order 8 capsules of the Ergocalciferol, if needed.

## 2015-03-07 ENCOUNTER — Emergency Department (HOSPITAL_COMMUNITY)
Admission: EM | Admit: 2015-03-07 | Discharge: 2015-03-07 | Disposition: A | Discharge status: Home / Self Care | Payer: Medicaid Other | Attending: Emergency Medicine | Admitting: Emergency Medicine

## 2015-03-07 ENCOUNTER — Emergency Department (HOSPITAL_COMMUNITY): Payer: Medicaid Other

## 2015-03-07 ENCOUNTER — Encounter (HOSPITAL_COMMUNITY): Payer: Self-pay

## 2015-03-07 DIAGNOSIS — Z880 Allergy status to penicillin: Secondary | ICD-10-CM | POA: Diagnosis not present

## 2015-03-07 DIAGNOSIS — Z91040 Latex allergy status: Secondary | ICD-10-CM | POA: Insufficient documentation

## 2015-03-07 DIAGNOSIS — I10 Essential (primary) hypertension: Secondary | ICD-10-CM | POA: Insufficient documentation

## 2015-03-07 DIAGNOSIS — N200 Calculus of kidney: Secondary | ICD-10-CM | POA: Diagnosis not present

## 2015-03-07 DIAGNOSIS — Z86011 Personal history of benign neoplasm of the brain: Secondary | ICD-10-CM | POA: Diagnosis not present

## 2015-03-07 DIAGNOSIS — Z3202 Encounter for pregnancy test, result negative: Secondary | ICD-10-CM | POA: Insufficient documentation

## 2015-03-07 DIAGNOSIS — Z86711 Personal history of pulmonary embolism: Secondary | ICD-10-CM | POA: Insufficient documentation

## 2015-03-07 DIAGNOSIS — Z79899 Other long term (current) drug therapy: Secondary | ICD-10-CM | POA: Diagnosis not present

## 2015-03-07 DIAGNOSIS — R1084 Generalized abdominal pain: Secondary | ICD-10-CM | POA: Diagnosis present

## 2015-03-07 HISTORY — DX: Disorder of parathyroid gland, unspecified: E21.5

## 2015-03-07 HISTORY — DX: Vitamin D deficiency, unspecified: E55.9

## 2015-03-07 LAB — COMPREHENSIVE METABOLIC PANEL
ALT: 16 U/L (ref 0–35)
ANION GAP: 6 (ref 5–15)
AST: 18 U/L (ref 0–37)
Albumin: 3.8 g/dL (ref 3.5–5.2)
Alkaline Phosphatase: 121 U/L — ABNORMAL HIGH (ref 39–117)
BILIRUBIN TOTAL: 0.6 mg/dL (ref 0.3–1.2)
BUN: 13 mg/dL (ref 6–23)
CALCIUM: 10.6 mg/dL — AB (ref 8.4–10.5)
CHLORIDE: 107 mmol/L (ref 96–112)
CO2: 29 mmol/L (ref 19–32)
CREATININE: 0.81 mg/dL (ref 0.50–1.10)
GFR calc Af Amer: >90 mL/min (ref >90)
GFR calc non Af Amer: 88 mL/min — ABNORMAL LOW (ref >90)
GLUCOSE: 102 mg/dL — AB (ref 70–99)
Potassium: 4.3 mmol/L (ref 3.5–5.1)
SODIUM: 142 mmol/L (ref 135–145)
Total Protein: 7.6 g/dL (ref 6.0–8.3)

## 2015-03-07 LAB — CBC WITH DIFFERENTIAL/PLATELET
BASOS ABS: 0.1 10*3/uL (ref 0.0–0.1)
BASOS PCT: 1 % (ref 0–1)
Eosinophils Absolute: 0.2 10*3/uL (ref 0.0–0.7)
Eosinophils Relative: 3 % (ref 0–5)
HEMATOCRIT: 43.2 % (ref 36.0–46.0)
HEMOGLOBIN: 13.4 g/dL (ref 12.0–15.0)
Lymphocytes Relative: 28 % (ref 12–46)
Lymphs Abs: 1.7 10*3/uL (ref 0.7–4.0)
MCH: 28.3 pg (ref 26.0–34.0)
MCHC: 31 g/dL (ref 30.0–36.0)
MCV: 91.1 fL (ref 78.0–100.0)
MONO ABS: 0.7 10*3/uL (ref 0.1–1.0)
Monocytes Relative: 11 % (ref 3–12)
NEUTROS ABS: 3.5 10*3/uL (ref 1.7–7.7)
NEUTROS PCT: 57 % (ref 43–77)
PLATELETS: 343 10*3/uL (ref 150–400)
RBC: 4.74 MIL/uL (ref 3.87–5.11)
RDW: 13.9 % (ref 11.5–15.5)
WBC: 6.1 10*3/uL (ref 4.0–10.5)

## 2015-03-07 LAB — URINALYSIS, ROUTINE W REFLEX MICROSCOPIC
BILIRUBIN URINE: NEGATIVE
Glucose, UA: NEGATIVE mg/dL
HGB URINE DIPSTICK: NEGATIVE
KETONES UR: NEGATIVE mg/dL
LEUKOCYTES UA: NEGATIVE
NITRITE: NEGATIVE
PROTEIN: NEGATIVE mg/dL
Specific Gravity, Urine: 1.017 (ref 1.005–1.030)
Urobilinogen, UA: 1 mg/dL (ref 0.0–1.0)
pH: 6 (ref 5.0–8.0)

## 2015-03-07 LAB — PREGNANCY, URINE: PREG TEST UR: NEGATIVE

## 2015-03-07 MED ORDER — ONDANSETRON HCL 4 MG PO TABS: 4.0000 mg | ORAL_TABLET | Freq: Four times a day (QID) | ORAL | Status: DC

## 2015-03-07 MED ORDER — SODIUM CHLORIDE 0.9 % IV BOLUS (SEPSIS)
1000.0000 mL | Freq: Once | INTRAVENOUS | Status: AC
  Administered 2015-03-07: 1000 mL via INTRAVENOUS

## 2015-03-07 MED ORDER — HYDROMORPHONE HCL 1 MG/ML IJ SOLN
0.5000 mg | Freq: Once | INTRAMUSCULAR | Status: AC
Start: 2015-03-07 — End: 2015-03-07
  Administered 2015-03-07: 0.5 mg via INTRAVENOUS
  Filled 2015-03-07: qty 1

## 2015-03-07 MED ORDER — TAMSULOSIN HCL 0.4 MG PO CAPS: 0.4000 mg | ORAL_CAPSULE | Freq: Every day | ORAL | Status: DC

## 2015-03-07 MED ORDER — OXYCODONE-ACETAMINOPHEN 5-325 MG PO TABS: 1.0000 | ORAL_TABLET | Freq: Four times a day (QID) | ORAL | Status: DC | PRN

## 2015-03-07 MED ORDER — ONDANSETRON HCL 4 MG/2ML IJ SOLN
4.0000 mg | Freq: Once | INTRAMUSCULAR | Status: AC
Start: 2015-03-07 — End: 2015-03-07
  Administered 2015-03-07: 4 mg via INTRAVENOUS
  Filled 2015-03-07: qty 2

## 2015-03-07 MED ORDER — HYDROMORPHONE HCL 1 MG/ML IJ SOLN
1.0000 mg | Freq: Once | INTRAMUSCULAR | Status: AC
  Administered 2015-03-07: 1 mg via INTRAVENOUS
  Filled 2015-03-07: qty 1

## 2015-03-07 NOTE — Discharge Instructions (Signed)
Follow-up with your urologist. Return to the ER with any severe pain you're unable to control, nausea, vomiting, inability to eat or drink food or fluids, high fever, difficulty urinating.  Kidney Stones Kidney stones (urolithiasis) are deposits that form inside your kidneys. The intense pain is caused by the stone moving through the urinary tract. When the stone moves, the ureter goes into spasm around the stone. The stone is usually passed in the urine.  CAUSES   A disorder that makes certain neck glands produce too much parathyroid hormone (primary hyperparathyroidism).  A buildup of uric acid crystals, similar to gout in your joints.  Narrowing (stricture) of the ureter.  A kidney obstruction present at birth (congenital obstruction).  Previous surgery on the kidney or ureters.  Numerous kidney infections. SYMPTOMS   Feeling sick to your stomach (nauseous).  Throwing up (vomiting).  Blood in the urine (hematuria).  Pain that usually spreads (radiates) to the groin.  Frequency or urgency of urination. DIAGNOSIS   Taking a history and physical exam.  Blood or urine tests.  CT scan.  Occasionally, an examination of the inside of the urinary bladder (cystoscopy) is performed. TREATMENT   Observation.  Increasing your fluid intake.  Extracorporeal shock wave lithotripsy--This is a noninvasive procedure that uses shock waves to break up kidney stones.  Surgery may be needed if you have severe pain or persistent obstruction. There are various surgical procedures. Most of the procedures are performed with the use of small instruments. Only small incisions are needed to accommodate these instruments, so recovery time is minimized. The size, location, and chemical composition are all important variables that will determine the proper choice of action for you. Talk to your health care provider to better understand your situation so that you will minimize the risk of injury to  yourself and your kidney.  HOME CARE INSTRUCTIONS   Drink enough water and fluids to keep your urine clear or pale yellow. This will help you to pass the stone or stone fragments.  Strain all urine through the provided strainer. Keep all particulate matter and stones for your health care provider to see. The stone causing the pain may be as small as a grain of salt. It is very important to use the strainer each and every time you pass your urine. The collection of your stone will allow your health care provider to analyze it and verify that a stone has actually passed. The stone analysis will often identify what you can do to reduce the incidence of recurrences.  Only take over-the-counter or prescription medicines for pain, discomfort, or fever as directed by your health care provider.  Make a follow-up appointment with your health care provider as directed.  Get follow-up X-rays if required. The absence of pain does not always mean that the stone has passed. It may have only stopped moving. If the urine remains completely obstructed, it can cause loss of kidney function or even complete destruction of the kidney. It is your responsibility to make sure X-rays and follow-ups are completed. Ultrasounds of the kidney can show blockages and the status of the kidney. Ultrasounds are not associated with any radiation and can be performed easily in a matter of minutes. SEEK MEDICAL CARE IF:  You experience pain that is progressive and unresponsive to any pain medicine you have been prescribed. SEEK IMMEDIATE MEDICAL CARE IF:   Pain cannot be controlled with the prescribed medicine.  You have a fever or shaking chills.  The severity  or intensity of pain increases over 18 hours and is not relieved by pain medicine.  You develop a new onset of abdominal pain.  You feel faint or pass out.  You are unable to urinate. MAKE SURE YOU:   Understand these instructions.  Will watch your  condition.  Will get help right away if you are not doing well or get worse. Document Released: 12/04/2005 Document Revised: 08/06/2013 Document Reviewed: 05/07/2013 [facility] Patient Information 2015 [place], [place]. This information is not intended to replace advice given to you by your health care provider. Make sure you discuss any questions you have with your health care provider.

## 2015-03-07 NOTE — ED Notes (Signed)
Patient states she has a history of kidney stones. Patient reports left flank pain that radiates into the lower abdomen x 3 weeks with pain becoming progressively worse. Patient reports blood in her urine yesterday.

## 2015-03-07 NOTE — ED Provider Notes (Signed)
CSN: [id number]     Arrival date & time 03/07/15  5361 History   First MD Initiated Contact with Patient 03/07/15 [phone]     Chief Complaint  Patient presents with  . Nephrolithiasis  . Flank Pain     (Consider location/radiation/quality/duration/timing/severity/associated sxs/prior Treatment) HPI Carol Ortega is a 44 year old female with past medical history of kidney stones, UTI, morbid obesity who presents the ER complaining of left-sided flank pain. She reports gradual onset of her left-sided flank pain for the past 3 weeks, with worsening over the past several days. Patient reports associated hematuria yesterday as well as nausea, vomiting over the past 24 hours. Patient states her pain radiates from her left flank into her left abdomen. Patient denies fever, diarrhea, dysuria.  Past Medical History  Diagnosis Date  . Kidney stone   . UTI (urinary tract infection)   . Obesity   . Brain tumor   . Radiation     Brain tumor  . Hypertension   . Pulmonary emboli 2011  . Complication of anesthesia 1999    lung collapse after surgery for gallbladder  . Parathyroid abnormality   . Vitamin D deficiency    Past Surgical History  Procedure Laterality Date  . Kidney stone removal    . Lithotripsy    . Cystoscopy w/ ureteroscopy    . Cystoscopy with stent placement Left 01/12/2014    Procedure: CYSTOSCOPY WITH STENT PLACEMENT left retrograde;  Surgeon: Irine Seal, MD;  Location: [facility];  Service: Urology;  Laterality: Left;  . Surgery for,ectopic pregnancy  2011     1 fallopian tube rupture  . Tubal ligation  2011  . Cholecystectomy  1999  . Cystoscopy with retrograde pyelogram, ureteroscopy and stent placement Left 01/20/2014    Procedure: LEFT URETEROSCOPY WITH STENT PLACEMENT;  Surgeon: Irine Seal, MD;  Location: [facility];  Service: Urology;  Laterality: Left;  . Holmium laser application Left 04/20/85    Procedure: HOLMIUM LASER APPLICATION;  Surgeon: Irine Seal, MD;  Location: [facility];  Service: Urology;  Laterality: Left;   Family History  Problem Relation Age of Onset  . Bell's palsy Mother   . Heart failure Mother   . Asthma Father   . Hypertension Father    History  Substance Use Topics  . Smoking status: Never Smoker   . Smokeless tobacco: Never Used  . Alcohol Use: Yes     Comment: social only   OB History    No data available     Review of Systems  Constitutional: Negative for fever.  HENT: Negative for trouble swallowing.   Eyes: Negative for visual disturbance.  Respiratory: Negative for shortness of breath.   Cardiovascular: Negative for chest pain.  Gastrointestinal: Positive for nausea, vomiting and abdominal pain.  Genitourinary: Positive for flank pain. Negative for dysuria.  Musculoskeletal: Negative for neck pain.  Skin: Negative for rash.  Neurological: Negative for dizziness, weakness and numbness.  Psychiatric/Behavioral: Negative.       Allergies  Bee venom; Eggs or egg-derived products; Penicillins; Latex; Oxycodone-acetaminophen; and Penicillin g  Home Medications   Prior to Admission medications   Medication Sig Start Date End Date Taking? Authorizing Provider  albuterol (PROVENTIL HFA;VENTOLIN HFA) 108 (90 BASE) MCG/ACT inhaler Inhale 2 puffs into the lungs every 6 (six) hours as needed. Shortness of breath   Yes Historical Provider, MD  busPIRone (BUSPAR) 5 MG tablet Take 5 mg by mouth 2 (two) times daily.   Yes Historical Provider, MD  cholecalciferol (VITAMIN D) 1000 UNITS tablet Take 1,000 Units by mouth 4 (four) times daily.    Yes Historical Provider, MD  DULoxetine (CYMBALTA) 30 MG capsule Take 60 mg by mouth 3 (three) times daily. Patient states she takes two three times a day per patient   Yes Historical Provider, MD  HYDROcodone-acetaminophen (NORCO) 10-325 MG per tablet Take 1 tablet by mouth every 6 (six) hours as needed for pain (pain).   Yes Historical Provider, MD  lisinopril-hydrochlorothiazide  (PRINZIDE,ZESTORETIC) 20-12.5 MG per tablet Take 1 tablet by mouth every evening.    Yes Historical Provider, MD  LORazepam (ATIVAN) 1 MG tablet Take 1 mg by mouth every 6 (six) hours as needed for anxiety.    Yes Historical Provider, MD  ondansetron (ZOFRAN) 4 MG tablet Take 1 tablet (4 mg total) by mouth every 6 (six) hours. 12/16/14  Yes Benjamin Cartner, PA-C  ondansetron (ZOFRAN) 4 MG tablet Take 1 tablet (4 mg total) by mouth every 6 (six) hours. 03/07/15   Dahlia Bailiff, PA-C  oxyCODONE-acetaminophen (PERCOCET) 5-325 MG per tablet Take 1-2 tablets by mouth every 6 (six) hours as needed. 03/07/15   Dahlia Bailiff, PA-C  predniSONE (DELTASONE) 50 MG tablet Take 1 tablet daily for the next 4 days. Patient not taking: Reported on 12/16/2014 05/28/14   Pamella Pert, MD  tamsulosin (FLOMAX) 0.4 MG CAPS capsule Take 1 capsule (0.4 mg total) by mouth 2 (two) times daily. Patient not taking: Reported on 03/07/2015 12/16/14   Comer Locket, PA-C  tamsulosin (FLOMAX) 0.4 MG CAPS capsule Take 1 capsule (0.4 mg total) by mouth daily. 03/07/15   Dahlia Bailiff, PA-C   BP 138/77 mmHg  Pulse 82  Temp(Src) 97.9 F (36.6 C) (Oral)  Resp 16  SpO2 100% Physical Exam  Constitutional: She is oriented to person, place, and time. She appears well-developed and well-nourished.  Morbidly obese female in mild amount of distress due to pain.  HENT:  Head: Normocephalic and atraumatic.  Mouth/Throat: Oropharynx is clear and moist. No oropharyngeal exudate.  Eyes: Right eye exhibits no discharge. Left eye exhibits no discharge. No scleral icterus.  Neck: Normal range of motion.  Cardiovascular: Normal rate, regular rhythm and normal heart sounds.   No murmur heard. Pulmonary/Chest: Effort normal and breath sounds normal. No respiratory distress.  Abdominal: Soft. Normal appearance and bowel sounds are normal. There is tenderness in the left upper quadrant and left lower quadrant. There is no rigidity, no guarding, no  tenderness at McBurney's point and negative Murphy's sign.  Mild amount of generalized tenderness in left side of abdomen which patient describes as a "pressure".  Musculoskeletal: Normal range of motion. She exhibits no edema or tenderness.  Neurological: She is alert and oriented to person, place, and time. No cranial nerve deficit. Coordination normal.  Skin: Skin is warm and dry. No rash noted. She is not diaphoretic.  Psychiatric: She has a normal mood and affect.  Nursing note and vitals reviewed.   ED Course  Procedures (including critical care time) Labs Review Labs Reviewed  COMPREHENSIVE METABOLIC PANEL - Abnormal; Notable for the following:    Glucose, Bld 102 (*)    Calcium 10.6 (*)    Alkaline Phosphatase 121 (*)    GFR calc non Af Amer 88 (*)    All other components within normal limits  URINALYSIS, ROUTINE W REFLEX MICROSCOPIC - Abnormal; Notable for the following:    APPearance CLOUDY (*)    All other components within normal limits  CBC  WITH DIFFERENTIAL/PLATELET  PREGNANCY, URINE    Imaging Review US Renal  03/07/2015   CLINICAL DATA:  Left flank pain for 3 weeks.  EXAM: RENAL/URINARY TRACT ULTRASOUND COMPLETE  COMPARISON:  CT, 12/16/2014  FINDINGS: Right Kidney:  Length: 13.2 cm. Echogenicity within normal limits. No mass or hydronephrosis visualized.  Left Kidney:  Length: 15.8 cm. Normal parenchymal echogenicity. Mild hydronephrosis. No renal mass or convincing stone.  Bladder:  Wall appears mildly thickened to 4 mm. No bladder mass or stone. No left ureteral jet.  IMPRESSION: Left hydronephrosis and absent left ureteral jet at the bladder. Patient had bilateral intrarenal stones on the prior CT. Current findings suggest a left ureteral stone. Recommend follow-up unenhanced CT.  Bladder wall is mildly thickened. Consider cystitis if there are consistent clinical symptoms.   Electronically Signed   By: Lajean Manes M.D.   On: 03/07/2015 14:03     EKG  Interpretation None      MDM   Final diagnoses:  Nephrolithiasis    Pt has been diagnosed with a Kidney Stone via ultrasound There is no evidence of significant hydronephrosis, serum creatine WNL, vitals sign stable and the pt does not have irratractable vomiting. Pain well controlled here. Pt will be dc home with pain medications & has been advised to follow up with her urologist. Return precautions discussed, patient verbalizes understanding and agreement with this plan. I encouraged patient to call or return to ER should she have any worsening symptoms or should she have any questions or concerns.  BP 138/77 mmHg  Pulse 82  Temp(Src) 97.9 F (36.6 C) (Oral)  Resp 16  SpO2 100%  Signed,  Dahlia Bailiff, PA-C 3:35 PM   Patient discussed with Dr. Blanchie Dessert, MD   Dahlia Bailiff, PA-C 03/07/15 1535  Blanchie Dessert, MD 03/11/15 [phone]

## 2015-03-08 ENCOUNTER — Ambulatory Visit: Payer: Medicaid Other | Admitting: Internal Medicine

## 2015-03-09 ENCOUNTER — Encounter: Payer: Self-pay | Admitting: Internal Medicine

## 2015-03-09 ENCOUNTER — Ambulatory Visit (INDEPENDENT_AMBULATORY_CARE_PROVIDER_SITE_OTHER): Payer: Medicaid Other | Admitting: Internal Medicine

## 2015-03-09 VITALS — BP 118/68 | HR 92 | Temp 98.2°F | Resp 12 | Wt 376.0 lb

## 2015-03-09 DIAGNOSIS — E559 Vitamin D deficiency, unspecified: Secondary | ICD-10-CM | POA: Diagnosis not present

## 2015-03-09 MED ORDER — VITAMIN D (ERGOCALCIFEROL) 1.25 MG (50000 UNIT) PO CAPS: 50000.0000 [IU] | ORAL_CAPSULE | ORAL | Status: DC

## 2015-03-09 NOTE — Patient Instructions (Signed)
Please stop over the counter vitamin D and start Ergocalciferol 50,000 units once a week.  After 8 weeks, go to your PCP  for a vitamin D level. Continue with 5000 units of regular vitamin D daily afterwards.  Stay well hydrated.  Eat many veggies and fruit.

## 2015-03-09 NOTE — Progress Notes (Addendum)
Patient ID: Carol Ortega, female   DOB: 11-24-1971, 44 y.o.   MRN: [id number]  HPI  Carol Ortega is a 44 y.o.-year-old female, initially referred by Dr Tresa Moore (her urologist), for evaluation for hypercalcemia. PCP: Dr Eldridge Abrahams. Last visit 4 mo ago.  Reviewed hx: Pt was referred for hypercalcemia during investigation of nephrolithiasis. She tells me she was found to have a high calcium level in the past by PCP.   She has had L nephrolithiasis 2x a year since 2000 >> many visits to the ED. She has had lithotripsy and then extractions. Some of the stones she passed (12 mm). She has had a stent placed, now taken out.  Last kidney stone in 02/2015. Prev. 11/2014.    I reviewed pt's pertinent labs: Lab Results  Component Value Date   PTH 165.2* 02/09/2014   CALCIUM 10.6* 03/07/2015   CALCIUM 10.6* 08/30/2014   CALCIUM 10.6* 05/27/2014   CALCIUM 10.4 04/27/2014   CALCIUM 10.4 02/09/2014   CALCIUM 10.7* 01/25/2014   CALCIUM 10.7* 01/12/2014   CALCIUM 9.8 12/21/2013   CALCIUM 10.3 01/16/2013   CALCIUM 10.9* 10/01/2012   She has a h/o vitamin D deficiency few years ago >> started on the high dose Ergocalciferol, stopped subsequently.  01/2014: vit D 14 >> started 2000 IU vit D OTC 06/2014: vit D 9.5 >> increased her vit D supplementation to 4000 units daily 12/2014: vit D 6.9 >> increased her vit D supplementation to 5000 units daily  No h/o osteoporosis or fractures.  No h/o CKD. Last BUN/Cr: Lab Results  Component Value Date   BUN 13 03/07/2015   CREATININE 0.81 03/07/2015   She also has a history of left sphenoid wing meningioma >> had 32 days of radiation tx. She also has a h/o HTN and was on Lisinopril-HCTZ, but not recently. She is on Lisinopril only now.  I reviewed pt's medications, allergies, PMH, social hx, family hx, and changes were documented in the history of present illness. Otherwise, unchanged from my initial visit note.  ROS: Constitutional: + weight gain,  + fatigue, + hot flushes, + poor sleep Eyes: + blurry vision, no xerophthalmia ENT: no sore throat, no nodules palpated in throat, no dysphagia/odynophagia,+ hoarseness Cardiovascular: no CP/SOB/palpitations/leg swelling Respiratory: no cough/SOB Gastrointestinal: + N/no V/+ C/no heartburn Musculoskeletal: + muscle/+ joint aches Skin: no rashes Neurological: no tremors/numbness/tingling/dizziness, no HA + Irregular menstrual cycles, + low libido  PE: BP 118/68 mmHg  Pulse 92  Temp(Src) 98.2 F (36.8 C) (Oral)  Resp 12  Wt 376 lb (170.552 kg)  SpO2 98% Body mass index is 60.72 kg/(m^2). Wt Readings from Last 3 Encounters:  03/09/15 376 lb (170.552 kg)  11/05/14 370 lb (167.831 kg)  08/30/14 365 lb 6.4 oz (165.744 kg)   Constitutional: obese class 3, in NAD. Eyes: PERRLA, EOMI, no exophthalmos ENT: moist mucous membranes, no thyromegaly, no cervical lymphadenopathy Cardiovascular: RRR, No MRG Respiratory: CTA B Gastrointestinal: abdomen soft, NT, ND, BS+ Musculoskeletal: no deformities, strength intact in all 4 Skin: moist, warm, no rashes Neurological: no tremor with outstretched hands, DTR normal in all 4  Assessment: 1. Hypercalcemia/hyperparathyroidism  Records from [facility] (Dr Tresa Moore) - 01/29/2014: Patient with history of: - left nephrolithiasis - underwent SWL 2015 [place], subsequently presenting with refractory colic, and bacteriuria in [place] and managed with a stent 01/13/2014, followed by definitive ureteroscopy for large left UPJ stone - bacteriuria UTI - most recent positive culture with Escherichia coli 12/2013. Most recent urine culture on 01/25/2014,  negative. - Investigation for nephrolithiasis: Uric acid 5.4 (2.4-7) BMP normal, except glucose 102 and calcium 10.7 (8.4-10.5); her GFR was normal at >89. PTH 190.8 (14-72)  Component     Latest Ref Rng 02/09/2014  Vitamin D 1, 25 (OH) Total     18 - 72 pg/mL 129 (H)  Vitamin D3 1, 25 (OH)       100  Vitamin D2 1, 25 (OH)      29  PTH     14.0 - 72.0 pg/mL 165.2 (H)  Calcium     8.4 - 10.5 mg/dL 10.4  Vit D, 25-Hydroxy     30 - 89 ng/mL 14 (L)  Phosphorus     2.3 - 4.6 mg/dL 2.9   01/2014:   - vit D 14 (some time after a course or Ergocalciferol) 06/2014: - Magnesium 2.0 - vitamin D 9.5, prev. 12, 4 mo ago - TSH 2.54 - BMP: normal, except:  Aphos 131 (32-98), prev. 96, 4 mo ago AST 14 (15-41) Ca 11.2  12/24/2014 Labs from [facility]:  - calcium 11.3 (8.4-10.5) - GFR 105 - iPTH 32 ([facility])  - vitamin D 25 HO 6.9 (30-100) - lower than before!  Vitamin D lower than before. iPTH not high, but higher than expected for the 11.3 calcium. 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  2. Vit D def - see above  Plan: 1. And 2Patient has a h/o elevated calcium, with the highest level being at 11.1. PTH levels also high. Vit D very low >> we tried to replete without using high dose ergocalciferol however the last value that she had was very low, and 6.9, lower than before. It is not very clear to me if she is completely compliant with the doses, and she cannot remember her doses very well. I will assume compliance and we will need to start Ergocalciferol at this point. We will start 50,000 units weekly x 8 weeks and then recheck the vit D level. After she finishes the ergocalciferol, we can continue again with 5000 units of regular vitamin D daily.  - we have to be careful with vitamin D replacement 2/2 her h/o nephrolithiasis. - When vitamin D normalizes, will need to check a 24 h urinary calcium/creatinine ratio. - We discussed about other measures to prevent calcium stones including drinking plenty of water and trying to make her urine as alkaline is possible. I suggested that she eats a lot of fruit and vegetables. I also advised her to try to  check with her urologist to see if he has any advice about different measures to prevent her from forming calcium stones while we are trying to raise her vitamin D to allow further investigation for primary hyperparathyroidism and also until her surgery if this investigation is positive. - It is very likely that she does have a parathyroid adenoma based on the high PTH level with a borderline/high calcium, however, we need to recheck the tests when vit D normal. I can send her to surgery them before vitamin D normalizes, but we discussed that her vitamin D needs to be close to normal at the time of the surgery, otherwise, she would be at risk for hypocalcemia postop. - She does have complications from hypercalcemia - nephrolithiasis (per problem list in chart, this was with uric acid). No osteoporosis, no fractures. No  abdominal pain, depression, bone pain. - we again discussed with the patient about the physiology of calcium and parathyroid hormone - criteria for parathyroid surgery are:  Increased calcium by more than 1 mg/dL above the upper limit of normal  Kidney ds.  Osteoporosis  Age <35 years old - she does meet the criteria based on age. - If the tests indicate a parathyroid adenoma, I will refer her to surgery. She agrees with this. Return in about 4 months (around 07/09/2015).

## 2015-04-05 ENCOUNTER — Emergency Department (HOSPITAL_COMMUNITY)
Admission: EM | Admit: 2015-04-05 | Discharge: 2015-04-05 | Disposition: A | Discharge status: Home / Self Care | Payer: Medicaid Other | Attending: Emergency Medicine | Admitting: Emergency Medicine

## 2015-04-05 DIAGNOSIS — Z86011 Personal history of benign neoplasm of the brain: Secondary | ICD-10-CM | POA: Insufficient documentation

## 2015-04-05 DIAGNOSIS — Z86711 Personal history of pulmonary embolism: Secondary | ICD-10-CM | POA: Diagnosis not present

## 2015-04-05 DIAGNOSIS — Z791 Long term (current) use of non-steroidal anti-inflammatories (NSAID): Secondary | ICD-10-CM | POA: Diagnosis not present

## 2015-04-05 DIAGNOSIS — E669 Obesity, unspecified: Secondary | ICD-10-CM | POA: Insufficient documentation

## 2015-04-05 DIAGNOSIS — N23 Unspecified renal colic: Secondary | ICD-10-CM | POA: Insufficient documentation

## 2015-04-05 DIAGNOSIS — R109 Unspecified abdominal pain: Secondary | ICD-10-CM | POA: Diagnosis present

## 2015-04-05 DIAGNOSIS — E559 Vitamin D deficiency, unspecified: Secondary | ICD-10-CM | POA: Diagnosis not present

## 2015-04-05 DIAGNOSIS — Z87440 Personal history of urinary (tract) infections: Secondary | ICD-10-CM | POA: Diagnosis not present

## 2015-04-05 DIAGNOSIS — Z923 Personal history of irradiation: Secondary | ICD-10-CM | POA: Diagnosis not present

## 2015-04-05 DIAGNOSIS — Z3202 Encounter for pregnancy test, result negative: Secondary | ICD-10-CM | POA: Insufficient documentation

## 2015-04-05 DIAGNOSIS — Z91040 Latex allergy status: Secondary | ICD-10-CM | POA: Insufficient documentation

## 2015-04-05 DIAGNOSIS — Z880 Allergy status to penicillin: Secondary | ICD-10-CM | POA: Diagnosis not present

## 2015-04-05 DIAGNOSIS — Z79899 Other long term (current) drug therapy: Secondary | ICD-10-CM | POA: Diagnosis not present

## 2015-04-05 DIAGNOSIS — I10 Essential (primary) hypertension: Secondary | ICD-10-CM | POA: Insufficient documentation

## 2015-04-05 LAB — CBC WITH DIFFERENTIAL/PLATELET
BASOS PCT: 1 % (ref 0–1)
Basophils Absolute: 0.1 10*3/uL (ref 0.0–0.1)
EOS PCT: 3 % (ref 0–5)
Eosinophils Absolute: 0.2 10*3/uL (ref 0.0–0.7)
HCT: 40.4 % (ref 36.0–46.0)
Hemoglobin: 12.7 g/dL (ref 12.0–15.0)
LYMPHS PCT: 32 % (ref 12–46)
Lymphs Abs: 2.1 10*3/uL (ref 0.7–4.0)
MCH: 27.8 pg (ref 26.0–34.0)
MCHC: 31.4 g/dL (ref 30.0–36.0)
MCV: 88.4 fL (ref 78.0–100.0)
MONO ABS: 0.7 10*3/uL (ref 0.1–1.0)
Monocytes Relative: 11 % (ref 3–12)
NEUTROS ABS: 3.5 10*3/uL (ref 1.7–7.7)
Neutrophils Relative %: 53 % (ref 43–77)
PLATELETS: 280 10*3/uL (ref 150–400)
RBC: 4.57 MIL/uL (ref 3.87–5.11)
RDW: 13.5 % (ref 11.5–15.5)
WBC: 6.5 10*3/uL (ref 4.0–10.5)

## 2015-04-05 LAB — URINALYSIS, ROUTINE W REFLEX MICROSCOPIC
Bilirubin Urine: NEGATIVE
GLUCOSE, UA: NEGATIVE mg/dL
KETONES UR: NEGATIVE mg/dL
Nitrite: NEGATIVE
PH: 5.5 (ref 5.0–8.0)
Protein, ur: 30 mg/dL — AB
Specific Gravity, Urine: 1.022 (ref 1.005–1.030)
Urobilinogen, UA: 0.2 mg/dL (ref 0.0–1.0)

## 2015-04-05 LAB — COMPREHENSIVE METABOLIC PANEL
ALBUMIN: 3.3 g/dL — AB (ref 3.5–5.2)
ALK PHOS: 127 U/L — AB (ref 39–117)
ALT: 24 U/L (ref 0–35)
AST: 27 U/L (ref 0–37)
Anion gap: 9 (ref 5–15)
BUN: 10 mg/dL (ref 6–23)
CO2: 27 mmol/L (ref 19–32)
Calcium: 10.5 mg/dL (ref 8.4–10.5)
Chloride: 102 mmol/L (ref 96–112)
Creatinine, Ser: 0.76 mg/dL (ref 0.50–1.10)
GFR calc Af Amer: >90 mL/min (ref >90)
GFR calc non Af Amer: >90 mL/min (ref >90)
Glucose, Bld: 112 mg/dL — ABNORMAL HIGH (ref 70–99)
POTASSIUM: 3.8 mmol/L (ref 3.5–5.1)
SODIUM: 138 mmol/L (ref 135–145)
Total Bilirubin: 0.6 mg/dL (ref 0.3–1.2)
Total Protein: 6.6 g/dL (ref 6.0–8.3)

## 2015-04-05 LAB — LIPASE, BLOOD: Lipase: 21 U/L (ref 11–59)

## 2015-04-05 LAB — URINE MICROSCOPIC-ADD ON

## 2015-04-05 LAB — POC URINE PREG, ED: Preg Test, Ur: NEGATIVE

## 2015-04-05 MED ORDER — HYDROMORPHONE HCL 1 MG/ML IJ SOLN
1.0000 mg | Freq: Once | INTRAMUSCULAR | Status: AC
  Administered 2015-04-05: 1 mg via INTRAVENOUS
  Filled 2015-04-05: qty 1

## 2015-04-05 MED ORDER — SODIUM CHLORIDE 0.9 % IV BOLUS (SEPSIS)
1000.0000 mL | Freq: Once | INTRAVENOUS | Status: AC
  Administered 2015-04-05: 1000 mL via INTRAVENOUS

## 2015-04-05 MED ORDER — HYDROCODONE-ACETAMINOPHEN 5-325 MG PO TABS: 1.0000 | ORAL_TABLET | ORAL | Status: DC | PRN

## 2015-04-05 MED ORDER — ONDANSETRON HCL 4 MG/2ML IJ SOLN
4.0000 mg | Freq: Once | INTRAMUSCULAR | Status: AC
  Administered 2015-04-05: 4 mg via INTRAVENOUS
  Filled 2015-04-05: qty 2

## 2015-04-05 MED ORDER — KETOROLAC TROMETHAMINE 30 MG/ML IJ SOLN
30.0000 mg | Freq: Once | INTRAMUSCULAR | Status: AC
  Administered 2015-04-05: 30 mg via INTRAVENOUS
  Filled 2015-04-05: qty 1

## 2015-04-05 MED ORDER — TAMSULOSIN HCL 0.4 MG PO CAPS: 0.4000 mg | ORAL_CAPSULE | Freq: Every day | ORAL | Status: DC

## 2015-04-05 MED ORDER — ONDANSETRON HCL 4 MG PO TABS: 4.0000 mg | ORAL_TABLET | Freq: Four times a day (QID) | ORAL | Status: DC

## 2015-04-05 NOTE — ED Provider Notes (Signed)
CSN: [id number]     Arrival date & time 04/05/15  9528 History   First MD Initiated Contact with Patient 04/05/15 [phone]     Chief Complaint  Patient presents with  . Flank Pain  . Abdominal Pain     (Consider location/radiation/quality/duration/timing/severity/associated sxs/prior Treatment) HPI   Pt with hx kidney stones, UTI, obesity, p/w left sided flank pain and suprapubic pain and nausea that began yesterday.  Pain is constant and dull with change to throbbing after urination.  Has associated hematuria.  Pain is 10/10 intensity and feels like kidney stones she has had before.  Has hx ureteral stents x 3 for stones.  Denies fevers, CP, SOB, vomiting, change in bowel habits, abnormal vaginal discharge or bleeding.  Has appt with Dr Tresa Moore (urology) tomorrow.  Has used aleve at home without improvement.  LMP unsure, pt has irregular periods and has been told she is perimenopausal.    Past Medical History  Diagnosis Date  . Kidney stone   . UTI (urinary tract infection)   . Obesity   . Brain tumor   . Radiation     Brain tumor  . Hypertension   . Pulmonary emboli 2011  . Complication of anesthesia 1999    lung collapse after surgery for gallbladder  . Parathyroid abnormality   . Vitamin D deficiency    Past Surgical History  Procedure Laterality Date  . Kidney stone removal    . Lithotripsy    . Cystoscopy w/ ureteroscopy    . Cystoscopy with stent placement Left 01/12/2014    Procedure: CYSTOSCOPY WITH STENT PLACEMENT left retrograde;  Surgeon: Irine Seal, MD;  Location: [facility];  Service: Urology;  Laterality: Left;  . Surgery for,ectopic pregnancy  2011     1 fallopian tube rupture  . Tubal ligation  2011  . Cholecystectomy  1999  . Cystoscopy with retrograde pyelogram, ureteroscopy and stent placement Left 01/20/2014    Procedure: LEFT URETEROSCOPY WITH STENT PLACEMENT;  Surgeon: Irine Seal, MD;  Location: [facility];  Service: Urology;  Laterality: Left;  . Holmium laser  application Left 03/21/101    Procedure: HOLMIUM LASER APPLICATION;  Surgeon: Irine Seal, MD;  Location: [facility];  Service: Urology;  Laterality: Left;   Family History  Problem Relation Age of Onset  . Bell's palsy Mother   . Heart failure Mother   . Asthma Father   . Hypertension Father    History  Substance Use Topics  . Smoking status: Never Smoker   . Smokeless tobacco: Never Used  . Alcohol Use: Yes     Comment: social only   OB History    No data available     Review of Systems  All other systems reviewed and are negative.     Allergies  Bee venom; Eggs or egg-derived products; Other; Penicillins; Latex; Oxycodone-acetaminophen; and Penicillin g  Home Medications   Prior to Admission medications   Medication Sig Start Date End Date Taking? Authorizing Provider  albuterol (PROVENTIL HFA;VENTOLIN HFA) 108 (90 BASE) MCG/ACT inhaler Inhale 2 puffs into the lungs every 6 (six) hours as needed. Shortness of breath   Yes Historical Provider, MD  busPIRone (BUSPAR) 5 MG tablet Take 5 mg by mouth 3 (three) times daily.    Yes Historical Provider, MD  HYDROcodone-acetaminophen (NORCO) 10-325 MG per tablet Take 1 tablet by mouth every 6 (six) hours as needed for pain (pain).   Yes Historical Provider, MD  lisinopril-hydrochlorothiazide (PRINZIDE,ZESTORETIC) 20-12.5 MG per tablet Take  1 tablet by mouth every evening.    Yes Historical Provider, MD  naproxen sodium (ANAPROX) 220 MG tablet Take 660 mg by mouth 2 (two) times daily with a meal.   Yes Historical Provider, MD  Vitamin D, Ergocalciferol, (DRISDOL) 50000 UNITS CAPS capsule Take 1 capsule (50,000 Units total) by mouth every 7 (seven) days. 03/09/15  Yes Philemon Kingdom, MD  ondansetron (ZOFRAN) 4 MG tablet Take 1 tablet (4 mg total) by mouth every 6 (six) hours. Patient not taking: Reported on 03/09/2015 12/16/14   Comer Locket, PA-C  oxyCODONE-acetaminophen (PERCOCET) 5-325 MG per tablet Take 1-2 tablets by mouth every  6 (six) hours as needed. Patient not taking: Reported on 04/05/2015 03/07/15   Dahlia Bailiff, PA-C  predniSONE (DELTASONE) 50 MG tablet Take 1 tablet daily for the next 4 days. Patient not taking: Reported on 03/09/2015 05/28/14   Pamella Pert, MD  tamsulosin (FLOMAX) 0.4 MG CAPS capsule Take 1 capsule (0.4 mg total) by mouth 2 (two) times daily. Patient not taking: Reported on 04/05/2015 12/16/14   Comer Locket, PA-C   BP 143/64 mmHg  Pulse 87  Temp(Src) 97.8 F (36.6 C) (Oral)  Resp 17  Ht 5\' 7"  (1.702 m)  Wt 332 lb (150.594 kg)  BMI 51.99 kg/m2  SpO2 99%  LMP 03/29/2015 Physical Exam  Constitutional: She appears well-developed and well-nourished. No distress.  obese  HENT:  Head: Normocephalic and atraumatic.  Neck: Neck supple.  Cardiovascular: Normal rate and regular rhythm.   Pulmonary/Chest: Effort normal and breath sounds normal. No respiratory distress. She has no wheezes. She has no rales.  Abdominal: Soft. She exhibits no distension. There is tenderness. There is no rebound and no guarding.  +left CVA, diffuse left sided abdominal tenderness  Neurological: She is alert.  Skin: She is not diaphoretic.  Nursing note and vitals reviewed.   ED Course  Procedures (including critical care time) Labs Review Labs Reviewed  COMPREHENSIVE METABOLIC PANEL - Abnormal; Notable for the following:    Glucose, Bld 112 (*)    Albumin 3.3 (*)    Alkaline Phosphatase 127 (*)    All other components within normal limits  URINALYSIS, ROUTINE W REFLEX MICROSCOPIC - Abnormal; Notable for the following:    APPearance CLOUDY (*)    Hgb urine dipstick MODERATE (*)    Protein, ur 30 (*)    Leukocytes, UA SMALL (*)    All other components within normal limits  URINE MICROSCOPIC-ADD ON - Abnormal; Notable for the following:    Squamous Epithelial / LPF MANY (*)    All other components within normal limits  URINE CULTURE  CBC WITH DIFFERENTIAL/PLATELET  LIPASE, BLOOD  POC URINE PREG,  ED    Imaging Review No results found.   EKG Interpretation None       Bedside US by Dr Reather Converse revealed no significant hydronephrosis.    12:48 PM Pt reports pain is improving, tolerating PO fluids.    MDM   Final diagnoses:  Renal colic on left side   Afebrile, nontoxic patient with left flank pain with hematuria and dysuria, c/w prior ureteral stones.  No significant hydronephrosis on bedside US (please see Dr Reather Converse' note for further documentation).  UA with hematuria but no obvious infection, 3-6 WBC with rare bacteria, many squamous cells.  Sent for culture.  Renal function is normal.  Labs unremarkable   D/C home with urology follow up with Dr Tresa Moore, previously scheduled for tomorrow, with norco, zofran, and tamsulosin.  Discussed  result, findings, treatment, and follow up  with patient.  Pt given return precautions.  Pt verbalizes understanding and agrees with plan.        Clayton Bibles, PA-C 04/05/15 [place], MD 04/05/15 [phone]

## 2015-04-05 NOTE — Discharge Instructions (Signed)
Read the information below.  Use the prescribed medication as directed.  Please discuss all new medications with your pharmacist.  Do not take additional tylenol while taking the prescribed pain medication to avoid overdose.  You may return to the Emergency Department at any time for worsening condition or any new symptoms that concern you.  If you develop high fevers, worsening abdominal pain, uncontrolled vomiting, or are unable to tolerate fluids by mouth, return to the ER for a recheck.     Ureteral Colic (Kidney Stones) Ureteral colic is the result of a condition when kidney stones form inside the kidney. Once kidney stones are formed they may move into the tube that connects the kidney with the bladder (ureter). If this occurs, this condition may cause pain (colic) in the ureter.  CAUSES  Pain is caused by stone movement in the ureter and the obstruction caused by the stone. SYMPTOMS  The pain comes and goes as the ureter contracts around the stone. The pain is usually intense, sharp, and stabbing in character. The location of the pain may move as the stone moves through the ureter. When the stone is near the kidney the pain is usually located in the back and radiates to the belly (abdomen). When the stone is ready to pass into the bladder the pain is often located in the lower abdomen on the side the stone is located. At this location, the symptoms may mimic those of a urinary tract infection with urinary frequency. Once the stone is located here it often passes into the bladder and the pain disappears completely. TREATMENT   Your caregiver will provide you with medicine for pain relief.  You may require specialized follow-up X-rays.  The absence of pain does not always mean that the stone has passed. It may have just stopped moving. If the urine remains completely obstructed, it can cause loss of kidney function or even complete destruction of the involved kidney. It is your responsibility and  in your interest that X-rays and follow-ups as suggested by your caregiver are completed. Relief of pain without passage of the stone can be associated with severe damage to the kidney, including loss of kidney function on that side.  If your stone does not pass on its own, additional measures may be taken by your caregiver to ensure its removal. HOME CARE INSTRUCTIONS   Increase your fluid intake. Water is the preferred fluid since juices containing vitamin C may acidify the urine making it less likely for certain stones (uric acid stones) to pass.  Strain all urine. A strainer will be provided. Keep all particulate matter or stones for your caregiver to inspect.  Take your pain medicine as directed.  Make a follow-up appointment with your caregiver as directed.  Remember that the goal is passage of your stone. The absence of pain does not mean the stone is gone. Follow your caregiver's instructions.  Only take over-the-counter or prescription medicines for pain, discomfort, or fever as directed by your caregiver. SEEK MEDICAL CARE IF:   Pain cannot be controlled with the prescribed medicine.  You have a fever.  Pain continues for longer than your caregiver advises it should.  There is a change in the pain, and you develop chest discomfort or constant abdominal pain.  You feel faint or pass out. MAKE SURE YOU:   Understand these instructions.  Will watch your condition.  Will get help right away if you are not doing well or get worse. Document Released: 09/13/2005  Document Revised: 03/31/2013 Document Reviewed: 05/31/2011 [facility] Patient Information 2015 [place], [place]. This information is not intended to replace advice given to you by your health care provider. Make sure you discuss any questions you have with your health care provider.

## 2015-04-05 NOTE — ED Notes (Signed)
gingerale given-- requesting crackers

## 2015-04-05 NOTE — ED Notes (Signed)
Attempted blood draw, pt moved when stuck with needle and did not get blood.

## 2015-04-05 NOTE — ED Notes (Addendum)
Pt states has hx of kidney stones, having low abd pain with urination, nauseated. Feels drained, tired all the time.  Hx of Parathyroid problems-- making too much Calcium, cannot have that fixed until Vit D level goes up. Also has hx brain tumor -- with radiation.

## 2015-04-06 LAB — URINE CULTURE: Special Requests: NORMAL

## 2015-04-08 ENCOUNTER — Encounter (HOSPITAL_COMMUNITY): Payer: Self-pay

## 2015-04-08 ENCOUNTER — Ambulatory Visit (HOSPITAL_COMMUNITY): Payer: Medicaid Other

## 2015-04-08 ENCOUNTER — Emergency Department (HOSPITAL_COMMUNITY)
Admission: EM | Admit: 2015-04-08 | Discharge: 2015-04-08 | Disposition: A | Discharge status: Home / Self Care | Payer: Medicaid Other | Attending: Emergency Medicine | Admitting: Emergency Medicine

## 2015-04-08 DIAGNOSIS — Z87440 Personal history of urinary (tract) infections: Secondary | ICD-10-CM | POA: Insufficient documentation

## 2015-04-08 DIAGNOSIS — Z91040 Latex allergy status: Secondary | ICD-10-CM | POA: Insufficient documentation

## 2015-04-08 DIAGNOSIS — N1330 Unspecified hydronephrosis: Secondary | ICD-10-CM

## 2015-04-08 DIAGNOSIS — Z86011 Personal history of benign neoplasm of the brain: Secondary | ICD-10-CM | POA: Insufficient documentation

## 2015-04-08 DIAGNOSIS — Z86711 Personal history of pulmonary embolism: Secondary | ICD-10-CM | POA: Insufficient documentation

## 2015-04-08 DIAGNOSIS — Z880 Allergy status to penicillin: Secondary | ICD-10-CM | POA: Insufficient documentation

## 2015-04-08 DIAGNOSIS — I10 Essential (primary) hypertension: Secondary | ICD-10-CM | POA: Insufficient documentation

## 2015-04-08 DIAGNOSIS — E559 Vitamin D deficiency, unspecified: Secondary | ICD-10-CM | POA: Diagnosis not present

## 2015-04-08 DIAGNOSIS — Z79899 Other long term (current) drug therapy: Secondary | ICD-10-CM | POA: Diagnosis not present

## 2015-04-08 DIAGNOSIS — Z3202 Encounter for pregnancy test, result negative: Secondary | ICD-10-CM | POA: Insufficient documentation

## 2015-04-08 DIAGNOSIS — Z87442 Personal history of urinary calculi: Secondary | ICD-10-CM | POA: Diagnosis not present

## 2015-04-08 DIAGNOSIS — Z791 Long term (current) use of non-steroidal anti-inflammatories (NSAID): Secondary | ICD-10-CM | POA: Diagnosis not present

## 2015-04-08 DIAGNOSIS — R1031 Right lower quadrant pain: Secondary | ICD-10-CM | POA: Diagnosis present

## 2015-04-08 LAB — URINALYSIS, ROUTINE W REFLEX MICROSCOPIC
Bilirubin Urine: NEGATIVE
GLUCOSE, UA: NEGATIVE mg/dL
Ketones, ur: NEGATIVE mg/dL
LEUKOCYTES UA: NEGATIVE
Nitrite: NEGATIVE
PROTEIN: NEGATIVE mg/dL
SPECIFIC GRAVITY, URINE: 1.021 (ref 1.005–1.030)
Urobilinogen, UA: 1 mg/dL (ref 0.0–1.0)
pH: 6 (ref 5.0–8.0)

## 2015-04-08 LAB — CBC WITH DIFFERENTIAL/PLATELET
BASOS ABS: 0.1 10*3/uL (ref 0.0–0.1)
BASOS PCT: 1 % (ref 0–1)
EOS ABS: 0.2 10*3/uL (ref 0.0–0.7)
Eosinophils Relative: 3 % (ref 0–5)
HCT: 41.3 % (ref 36.0–46.0)
Hemoglobin: 12.9 g/dL (ref 12.0–15.0)
Lymphocytes Relative: 31 % (ref 12–46)
Lymphs Abs: 2.4 10*3/uL (ref 0.7–4.0)
MCH: 27.9 pg (ref 26.0–34.0)
MCHC: 31.2 g/dL (ref 30.0–36.0)
MCV: 89.4 fL (ref 78.0–100.0)
Monocytes Absolute: 0.9 10*3/uL (ref 0.1–1.0)
Monocytes Relative: 12 % (ref 3–12)
NEUTROS PCT: 53 % (ref 43–77)
Neutro Abs: 4.1 10*3/uL (ref 1.7–7.7)
PLATELETS: 306 10*3/uL (ref 150–400)
RBC: 4.62 MIL/uL (ref 3.87–5.11)
RDW: 13.6 % (ref 11.5–15.5)
WBC: 7.7 10*3/uL (ref 4.0–10.5)

## 2015-04-08 LAB — BASIC METABOLIC PANEL
ANION GAP: 6 (ref 5–15)
BUN: 12 mg/dL (ref 6–23)
CHLORIDE: 108 mmol/L (ref 96–112)
CO2: 24 mmol/L (ref 19–32)
Calcium: 10.4 mg/dL (ref 8.4–10.5)
Creatinine, Ser: 0.79 mg/dL (ref 0.50–1.10)
GFR calc Af Amer: >90 mL/min (ref >90)
GFR calc non Af Amer: >90 mL/min (ref >90)
Glucose, Bld: 120 mg/dL — ABNORMAL HIGH (ref 70–99)
Potassium: 3.9 mmol/L (ref 3.5–5.1)
Sodium: 138 mmol/L (ref 135–145)

## 2015-04-08 LAB — URINE MICROSCOPIC-ADD ON

## 2015-04-08 LAB — POC URINE PREG, ED: Preg Test, Ur: NEGATIVE

## 2015-04-08 MED ORDER — IOHEXOL 300 MG/ML  SOLN
125.0000 mL | Freq: Once | INTRAMUSCULAR | Status: AC | PRN
Start: 2015-04-08 — End: 2015-04-08
  Administered 2015-04-08: 125 mL via INTRAVENOUS

## 2015-04-08 MED ORDER — HYDROMORPHONE HCL 1 MG/ML IJ SOLN
1.0000 mg | Freq: Once | INTRAMUSCULAR | Status: AC
  Administered 2015-04-08: 1 mg via INTRAVENOUS
  Filled 2015-04-08: qty 1

## 2015-04-08 MED ORDER — ONDANSETRON HCL 4 MG/2ML IJ SOLN
4.0000 mg | Freq: Once | INTRAMUSCULAR | Status: AC
  Administered 2015-04-08: 4 mg via INTRAVENOUS
  Filled 2015-04-08: qty 2

## 2015-04-08 MED ORDER — IOHEXOL 300 MG/ML  SOLN: 50.0000 mL | Freq: Once | INTRAMUSCULAR | Status: DC | PRN

## 2015-04-08 NOTE — Discharge Instructions (Signed)
Hydronephrosis Keep your follow up appointment with urology on 04/15/15.  Take percocet as needed for pain. Hydronephrosis is an abnormal enlargement of your kidney. It can affect one or both the kidneys. It results from the backward pressure of urine on the kidneys, when the flow of urine is blocked. Normally, the urine drains from the kidney through the urine tube (ureter), into a sac which holds the urine until urination (bladder). When the urinary flow is blocked, the urine collects above the block. This causes an increase in the pressure inside the kidney, which in turn leads to its enlargement. The block can occur at the point where the kidney joins the ureter. Treatment depends on the cause and location of the block.  CAUSES  The causes of this condition include:  Birth defect of the kidney or ureter.  Kink at the point where the kidney joins the ureter.  Stones and blood clots in the kidney or ureter.  Cancer, injury, or infection of the ureter.  Scar tissue formation.  Backflow of urine (reflux).  Cancer of bladder or prostate gland.  Abnormality of the nerves or muscles of the kidney or ureter.  Lower part of the ureter protruding into the bladder (ureterocele).  Abnormal contractions of the bladder.  Both the kidneys can be affected during pregnancy. This is because the enlarging uterus presses on the ureters and blocks the flow of urine. SYMPTOMS  The symptoms depend on the location of the block. They also depend on how long the block has been present. You may feel pain on the affected side. Sometimes, you may not have any symptoms. There may be a dull ache or discomfort in the flank. The common symptoms are:  Flank pain.  Swelling of the abdomen.  Pain in the abdomen.  Nausea and vomiting.  Fever.  Pain while passing urine.  Urgency for urination.  Frequent or urgent urination.  Infection of the urinary tract. DIAGNOSIS  Your caregiver will examine you  after asking about your symptoms. You may be asked to do blood and urine tests. Your caregiver may order a special X-ray, ultrasound, or CT scan. Sometimes a rigid or flexible telescope (cystoscope) is used to view the site of the blockage.  TREATMENT  Treatment depends on the site, cause, and duration of the block. The goal of treatment is to remove the blockage. Your caregiver will plan the treatment based on your condition. The different types of treatment are:   Putting in a soft plastic tube (ureteral stent) to connect the bladder with the kidney. This will help in draining the urine.  Putting in a soft tube (nephrostomy tube). This is placed through skin into the kidney. The trapped urine is drained out through the back. A plastic bag is attached to your skin to hold the urine that has drained out.  Antibiotics to treat or prevent infection.  Breaking down of the stone (lithotripsy). HOME CARE INSTRUCTIONS   It may take some time for the hydronephrosis to go away (resolve). Drink fluids as directed by your caregiver , and get a lot of rest.  If you have a drain in, your caregiver will give you directions about how to care for it. Be sure you understand these directions completely before you go home.  Take any antibiotics, pain medications, or other prescriptions exactly as prescribed.  Follow-up with your caregivers as directed. SEEK MEDICAL CARE IF:   You continue to have flank pain, nausea, or difficulty with urination.  You have  any problem with any type of drainage device.  Your urine becomes cloudy or bloody. SEEK IMMEDIATE MEDICAL CARE IF:   You have severe flank and/or abdominal pain.  You develop vomiting and are unable to hold down fluids.  You develop a fever above 100.5 F (38.1 C), or as per your caregiver. MAKE SURE YOU:   Understand these instructions.  Will watch your condition.  Will get help right away if you are not doing well or get worse. Document  Released: 10/01/2007 Document Revised: 02/26/2012 Document Reviewed: 11/17/2010 [facility] Patient Information 2015 [place], [place]. This information is not intended to replace advice given to you by your health care provider. Make sure you discuss any questions you have with your health care provider.

## 2015-04-08 NOTE — ED Notes (Signed)
Per EMS, Pt, from home, c/o flank pain radiating into lower abdominal pain/R groin starting this morning.  Pain score 10/10.  Hx of kidney stones.  Pt was seen at [facility] x 3 days ago for abdominal pain.  Pt reports that "this feels totally different."  Sts she has been taking prescribed pain medication w/o relief.

## 2015-04-08 NOTE — ED Notes (Signed)
Bed: GF94 Expected date:  Expected time:  Means of arrival:  Comments: EMS- 44yo F, flank pain, Hx of kidney stones

## 2015-04-08 NOTE — ED Provider Notes (Signed)
CSN: [id number]     Arrival date & time 04/08/15  8341 History   First MD Initiated Contact with Patient 04/08/15 0818     Chief Complaint  Patient presents with  . Abdominal Pain  . Flank Pain     (Consider location/radiation/quality/duration/timing/severity/associated sxs/prior Treatment) Patient is a 44 y.o. female presenting with abdominal pain and flank pain. The history is provided by the patient and the spouse. No language interpreter was used.  Abdominal Pain Flank Pain Associated symptoms include abdominal pain.  Carol Ortega is a 44 y.o female with a history of kidney stones that required lithotripsy and stents, brain tumor, HTN, obesity and PE. She presents for constant right lower quadrant abdominal pain with intermittent sharp twisting and knotting sensation that began at 6:15am. She states that movement makes it worse and nothing makes it better.  She also has nausea, one episode of vomiting, hematuria, and dysuria. She was evaluated for on 4/18 for for a left sided kidney stone and an [place] was done which showed no hydronephrosis. She states this pain is different and is on the right side this time. She states her urology appointment is on 4/28. She denies any fever, chills, chest pain, or shortness of breath.   Past Medical History  Diagnosis Date  . Kidney stone   . UTI (urinary tract infection)   . Obesity   . Brain tumor   . Radiation     Brain tumor  . Hypertension   . Pulmonary emboli 2011  . Complication of anesthesia 1999    lung collapse after surgery for gallbladder  . Parathyroid abnormality   . Vitamin D deficiency    Past Surgical History  Procedure Laterality Date  . Kidney stone removal    . Lithotripsy    . Cystoscopy w/ ureteroscopy    . Cystoscopy with stent placement Left 01/12/2014    Procedure: CYSTOSCOPY WITH STENT PLACEMENT left retrograde;  Surgeon: Irine Seal, MD;  Location: [facility];  Service: Urology;  Laterality: Left;  . Surgery for,ectopic  pregnancy  2011     1 fallopian tube rupture  . Tubal ligation  2011  . Cholecystectomy  1999  . Cystoscopy with retrograde pyelogram, ureteroscopy and stent placement Left 01/20/2014    Procedure: LEFT URETEROSCOPY WITH STENT PLACEMENT;  Surgeon: Irine Seal, MD;  Location: [facility];  Service: Urology;  Laterality: Left;  . Holmium laser application Left 08/23/2228    Procedure: HOLMIUM LASER APPLICATION;  Surgeon: Irine Seal, MD;  Location: [facility];  Service: Urology;  Laterality: Left;   Family History  Problem Relation Age of Onset  . Bell's palsy Mother   . Heart failure Mother   . Asthma Father   . Hypertension Father    History  Substance Use Topics  . Smoking status: Never Smoker   . Smokeless tobacco: Never Used  . Alcohol Use: Yes     Comment: social only   OB History    No data available     Review of Systems  Gastrointestinal: Positive for abdominal pain.  Genitourinary: Positive for flank pain.  All other systems reviewed and are negative.     Allergies  Bee venom; Eggs or egg-derived products; Other; Penicillins; Latex; Oxycodone-acetaminophen; and Penicillin g  Home Medications   Prior to Admission medications   Medication Sig Start Date End Date Taking? Authorizing Provider  albuterol (PROVENTIL HFA;VENTOLIN HFA) 108 (90 BASE) MCG/ACT inhaler Inhale 2 puffs into the lungs every 6 (six) hours as needed  for wheezing or shortness of breath.    Yes Historical Provider, MD  busPIRone (BUSPAR) 5 MG tablet Take 5 mg by mouth 3 (three) times daily.    Yes Historical Provider, MD  lisinopril-hydrochlorothiazide (PRINZIDE,ZESTORETIC) 20-12.5 MG per tablet Take 1 tablet by mouth every evening.    Yes Historical Provider, MD  naproxen sodium (ANAPROX) 220 MG tablet Take 660 mg by mouth 2 (two) times daily with a meal.   Yes Historical Provider, MD  Vitamin D, Ergocalciferol, (DRISDOL) 50000 UNITS CAPS capsule Take 1 capsule (50,000 Units total) by mouth every 7 (seven)  days. Patient taking differently: Take 50,000 Units by mouth every Monday.  03/09/15  Yes Philemon Kingdom, MD  HYDROcodone-acetaminophen (NORCO/VICODIN) 5-325 MG per tablet Take 1-2 tablets by mouth every 4 (four) hours as needed for moderate pain or severe pain. Patient not taking: Reported on 04/08/2015 04/05/15   Clayton Bibles, PA-C  ondansetron (ZOFRAN) 4 MG tablet Take 1 tablet (4 mg total) by mouth every 6 (six) hours. Patient not taking: Reported on 04/08/2015 04/05/15   Clayton Bibles, PA-C  predniSONE (DELTASONE) 50 MG tablet Take 1 tablet daily for the next 4 days. Patient not taking: Reported on 03/09/2015 05/28/14   Pamella Pert, MD  tamsulosin (FLOMAX) 0.4 MG CAPS capsule Take 1 capsule (0.4 mg total) by mouth daily. Patient not taking: Reported on 04/08/2015 04/05/15   Clayton Bibles, PA-C   BP 111/85 mmHg  Pulse 90  Temp(Src) 98.6 F (37 C) (Oral)  Resp 14  Ht 5\' 5"  (1.651 m)  SpO2 98%  LMP 03/29/2015 Physical Exam  Constitutional: She is oriented to person, place, and time. She appears well-developed and well-nourished.  Morbid obesity.  Cardiovascular: Normal rate, regular rhythm and normal heart sounds.   Pulmonary/Chest: Effort normal and breath sounds normal.  Abdominal: Soft. There is tenderness in the right upper quadrant and right lower quadrant. There is guarding. There is no CVA tenderness.  Hard to evaluate for hernia, distention or mass due to body habitus.   Neurological: She is alert and oriented to person, place, and time.  Skin: Skin is warm and dry.  Nursing note and vitals reviewed.   ED Course  Procedures (including critical care time) Labs Review Labs Reviewed  URINALYSIS, ROUTINE W REFLEX MICROSCOPIC - Abnormal; Notable for the following:    APPearance CLOUDY (*)    Hgb urine dipstick LARGE (*)    All other components within normal limits  BASIC METABOLIC PANEL - Abnormal; Notable for the following:    Glucose, Bld 120 (*)    All other components within  normal limits  URINE MICROSCOPIC-ADD ON - Abnormal; Notable for the following:    Bacteria, UA FEW (*)    Crystals CA OXALATE CRYSTALS (*)    All other components within normal limits  CBC WITH DIFFERENTIAL/PLATELET  POC URINE PREG, ED    Imaging Review Ct Abdomen Pelvis W Contrast  04/08/2015   CLINICAL DATA:  Flank pain on the right radiating to the right lower quadrant for 2 days  EXAM: CT ABDOMEN AND PELVIS WITH CONTRAST  TECHNIQUE: Multidetector CT imaging of the abdomen and pelvis was performed using the standard protocol following bolus administration of intravenous contrast.  CONTRAST:  OMNIPAQUE IOHEXOL 300 MG/ML SOLN, 157mL OMNIPAQUE IOHEXOL 300 MG/ML SOLN  COMPARISON:  03/07/2015  FINDINGS: Lung bases are free of acute infiltrate or sizable effusion.  The liver is diffusely decreased in attenuation consistent with fatty infiltration. The gallbladder has been surgically removed.  The spleen, adrenal glands and pancreas are all normal in their CT appearance. The left kidney is well visualized and demonstrates mid kidney calculus without obstructive changes. This measures approximately 9 mm in greatest dimension. Collecting system and left ureter are within normal limits.  The right kidney demonstrates enlargement with evidence of hydronephrosis and hydroureter. A nonobstructing stone measuring 5 mm is noted in the midportion of the right kidney. There are questionable changes at the ureteropelvic junction which may represent is small faintly calcified stone. Alternatively these changes may represent edema from recently passed stone.  The appendix is within normal limits. The bladder is well distended. No pelvic mass lesion or sidewall abnormality is noted.  IMPRESSION: Nonobstructing bilateral renal calculi.  Right-sided hydronephrosis with changes suggestive of a poorly calcified stone at the ureteral pelvic junction or edema from recently passed stone.  No other focal abnormality is noted.    Electronically Signed   By: Inez Catalina M.D.   On: 04/08/2015 11:23     EKG Interpretation None      MDM   Final diagnoses:  Hydronephrosis of right kidney  Patient presents for right sided abdominal pain, nausea, and vomiting since 6 am this morning. She had an US done 3 days ago for right sided abdominal pain but no hydronephrosis or kidney stone was identified.  Her pain is now on the right side.  I ordered a CT abdomen since she still has her appendix.  9:15 Patient vomiting. She has an IV.  Medications were order but have not been given yet.   Her labs are unremarkable. Her CT shows nonobstructing bilateral renal calculi.  She does have hydronephrosis but she is able to urinate and she does not have a UTI.  Her kidney function is normal.  I feel comfortable sending her home.  She has not filled her norco prescription from 3 days ago so I did not write for pain meds. She also has zofran and flomax from 4/18.  She will follow up with urology on 4/28 as scheduled a few days ago.  She agrees with the plan.  I gave her return precautions such as increased abdominal pain, dysuria, inability to urinate, and fever.     Ottie Glazier, PA-C 04/08/15 1548  Ernestina Patches, MD 04/08/15 [phone]

## 2015-04-16 ENCOUNTER — Other Ambulatory Visit: Payer: Self-pay | Admitting: Urology

## 2015-04-20 NOTE — Progress Notes (Signed)
Called for release to sign and held in Epic surgery 04-23-15 pre op 04-22-15 Thanks

## 2015-04-21 NOTE — Patient Instructions (Addendum)
YOUR PROCEDURE IS SCHEDULED ON :  04/23/15  REPORT TO [place] MAIN ENTRANCE FOLLOW SIGNS TO SHORT STAY CENTER AT :  5:30 AM  CALL THIS NUMBER IF YOU HAVE PROBLEMS THE MORNING OF SURGERY [phone]  REMEMBER:  DO NOT EAT FOOD OR DRINK LIQUIDS AFTER MIDNIGHT  TAKE THESE MEDICINES THE MORNING OF SURGERY:  BUSPAR / MAY TAKE HYDROCODONE IF NEEDED / MAY USE ALBUTEROL IF NEEDED  YOU MAY NOT HAVE ANY METAL ON YOUR BODY INCLUDING HAIR PINS AND PIERCING'S. DO NOT WEAR JEWELRY, MAKEUP, LOTIONS, POWDERS OR PERFUMES. DO NOT WEAR NAIL POLISH. DO NOT SHAVE 48 HRS PRIOR TO SURGERY. MEN MAY SHAVE FACE AND NECK.  DO NOT [place]. [place] IS NOT RESPONSIBLE FOR VALUABLES.  CONTACTS, DENTURES OR PARTIALS MAY NOT BE WORN TO SURGERY. LEAVE SUITCASE IN CAR. CAN BE BROUGHT TO ROOM AFTER SURGERY.  PATIENTS DISCHARGED THE DAY OF SURGERY WILL NOT BE ALLOWED TO DRIVE HOME.  PLEASE READ OVER THE FOLLOWING INSTRUCTION SHEETS _________________________________________________________________________________                                          [facility] - PREPARING FOR SURGERY  Before surgery, you can play an important role.  Because skin is not sterile, your skin needs to be as free of germs as possible.  You can reduce the number of germs on your skin by washing with CHG (chlorahexidine gluconate) soap before surgery.  CHG is an antiseptic cleaner which kills germs and bonds with the skin to continue killing germs even after washing. Please DO NOT use if you have an allergy to CHG or antibacterial soaps.  If your skin becomes reddened/irritated stop using the CHG and inform your nurse when you arrive at Short Stay. Do not shave (including legs and underarms) for at least 48 hours prior to the first CHG shower.  You may shave your face. Please follow these instructions carefully:   1.  Shower with CHG Soap the night before surgery and the  morning of  Surgery.   2.  If you choose to wash your hair, wash your hair first as usual with your  normal  Shampoo.   3.  After you shampoo, rinse your hair and body thoroughly to remove the  shampoo.                                         4.  Use CHG as you would any other liquid soap.  You can apply chg directly  to the skin and wash . Gently wash with scrungie or clean wascloth    5.  Apply the CHG Soap to your body ONLY FROM THE NECK DOWN.   Do not use on open                           Wound or open sores. Avoid contact with eyes, ears mouth and genitals (private parts).                        Genitals (private parts) with your normal soap.              6.  Wash thoroughly, paying special  attention to the area where your surgery  will be performed.   7.  Thoroughly rinse your body with warm water from the neck down.   8.  DO NOT shower/wash with your normal soap after using and rinsing off  the CHG Soap .                9.  Pat yourself dry with a clean towel.             10.  Wear clean night clothes to bed after shower             11.  Place clean sheets on your bed the night of your first shower and do not  sleep with pets.  Day of Surgery : Do not apply any lotions/deodorants the morning of surgery.  Please wear clean clothes to the hospital/surgery center.  FAILURE TO FOLLOW THESE INSTRUCTIONS MAY RESULT IN THE CANCELLATION OF YOUR SURGERY    PATIENT SIGNATURE_________________________________  ______________________________________________________________________

## 2015-04-22 ENCOUNTER — Encounter (HOSPITAL_COMMUNITY)
Admission: RE | Admit: 2015-04-22 | Discharge: 2015-04-22 | Disposition: A | Discharge status: Home / Self Care | Payer: Medicaid Other | Source: Ambulatory Visit | Attending: Anatomic Pathology & Clinical Pathology | Admitting: Anatomic Pathology & Clinical Pathology

## 2015-04-22 ENCOUNTER — Encounter (HOSPITAL_COMMUNITY): Payer: Self-pay

## 2015-04-22 DIAGNOSIS — R109 Unspecified abdominal pain: Secondary | ICD-10-CM | POA: Insufficient documentation

## 2015-04-22 DIAGNOSIS — Z01818 Encounter for other preprocedural examination: Secondary | ICD-10-CM | POA: Insufficient documentation

## 2015-04-22 DIAGNOSIS — N200 Calculus of kidney: Secondary | ICD-10-CM | POA: Insufficient documentation

## 2015-04-22 HISTORY — DX: Headache: R51

## 2015-04-22 HISTORY — DX: Anxiety disorder, unspecified: F41.9

## 2015-04-22 HISTORY — DX: Depression, unspecified: F32.A

## 2015-04-22 HISTORY — DX: Headache, unspecified: R51.9

## 2015-04-22 HISTORY — DX: Unspecified asthma, uncomplicated: J45.909

## 2015-04-22 HISTORY — DX: Major depressive disorder, single episode, unspecified: F32.9

## 2015-04-22 LAB — BASIC METABOLIC PANEL
ANION GAP: 7 (ref 5–15)
BUN: 15 mg/dL (ref 6–20)
CALCIUM: 10.8 mg/dL — AB (ref 8.9–10.3)
CO2: 27 mmol/L (ref 22–32)
CREATININE: 0.86 mg/dL (ref 0.44–1.00)
Chloride: 106 mmol/L (ref 101–111)
GFR calc Af Amer: >60 mL/min (ref >60)
GFR calc non Af Amer: >60 mL/min (ref >60)
Glucose, Bld: 103 mg/dL — ABNORMAL HIGH (ref 70–99)
Potassium: 4.5 mmol/L (ref 3.5–5.1)
Sodium: 140 mmol/L (ref 135–145)

## 2015-04-22 LAB — CBC
HCT: 41.1 % (ref 36.0–46.0)
Hemoglobin: 12.8 g/dL (ref 12.0–15.0)
MCH: 27.9 pg (ref 26.0–34.0)
MCHC: 31.1 g/dL (ref 30.0–36.0)
MCV: 89.5 fL (ref 78.0–100.0)
Platelets: 299 10*3/uL (ref 150–400)
RBC: 4.59 MIL/uL (ref 3.87–5.11)
RDW: 13.6 % (ref 11.5–15.5)
WBC: 8.7 10*3/uL (ref 4.0–10.5)

## 2015-04-22 LAB — HCG, SERUM, QUALITATIVE: Preg, Serum: NEGATIVE

## 2015-04-22 MED ORDER — GENTAMICIN SULFATE 40 MG/ML IJ SOLN
480.0000 mg | INTRAVENOUS | Status: DC
  Administered 2015-04-23: 480 mg via INTRAVENOUS
  Filled 2015-04-22: qty 12

## 2015-04-23 ENCOUNTER — Ambulatory Visit (HOSPITAL_COMMUNITY)
Admission: RE | Admit: 2015-04-23 | Discharge: 2015-04-23 | Disposition: A | Discharge status: Home / Self Care | Payer: Medicaid Other | Source: Ambulatory Visit | Attending: Urology | Admitting: Urology

## 2015-04-23 ENCOUNTER — Encounter (HOSPITAL_COMMUNITY): Payer: Self-pay

## 2015-04-23 ENCOUNTER — Ambulatory Visit (HOSPITAL_COMMUNITY): Payer: Medicaid Other | Admitting: Certified Registered Nurse Anesthetist

## 2015-04-23 ENCOUNTER — Encounter (HOSPITAL_COMMUNITY)
Admission: RE | Disposition: A | Discharge status: Home / Self Care | Payer: Self-pay | Source: Ambulatory Visit | Attending: Urology

## 2015-04-23 DIAGNOSIS — Z91012 Allergy to eggs: Secondary | ICD-10-CM | POA: Insufficient documentation

## 2015-04-23 DIAGNOSIS — Z91030 Bee allergy status: Secondary | ICD-10-CM | POA: Insufficient documentation

## 2015-04-23 DIAGNOSIS — F419 Anxiety disorder, unspecified: Secondary | ICD-10-CM | POA: Insufficient documentation

## 2015-04-23 DIAGNOSIS — Z886 Allergy status to analgesic agent status: Secondary | ICD-10-CM | POA: Insufficient documentation

## 2015-04-23 DIAGNOSIS — E559 Vitamin D deficiency, unspecified: Secondary | ICD-10-CM | POA: Insufficient documentation

## 2015-04-23 DIAGNOSIS — I10 Essential (primary) hypertension: Secondary | ICD-10-CM | POA: Diagnosis not present

## 2015-04-23 DIAGNOSIS — Z9851 Tubal ligation status: Secondary | ICD-10-CM | POA: Diagnosis not present

## 2015-04-23 DIAGNOSIS — F329 Major depressive disorder, single episode, unspecified: Secondary | ICD-10-CM | POA: Diagnosis not present

## 2015-04-23 DIAGNOSIS — Z923 Personal history of irradiation: Secondary | ICD-10-CM | POA: Insufficient documentation

## 2015-04-23 DIAGNOSIS — Z8249 Family history of ischemic heart disease and other diseases of the circulatory system: Secondary | ICD-10-CM | POA: Diagnosis not present

## 2015-04-23 DIAGNOSIS — J45909 Unspecified asthma, uncomplicated: Secondary | ICD-10-CM | POA: Insufficient documentation

## 2015-04-23 DIAGNOSIS — N2581 Secondary hyperparathyroidism of renal origin: Secondary | ICD-10-CM | POA: Diagnosis not present

## 2015-04-23 DIAGNOSIS — Z6841 Body Mass Index (BMI) 40.0 and over, adult: Secondary | ICD-10-CM | POA: Insufficient documentation

## 2015-04-23 DIAGNOSIS — N200 Calculus of kidney: Secondary | ICD-10-CM | POA: Diagnosis present

## 2015-04-23 DIAGNOSIS — Z880 Allergy status to penicillin: Secondary | ICD-10-CM | POA: Insufficient documentation

## 2015-04-23 DIAGNOSIS — Z91040 Latex allergy status: Secondary | ICD-10-CM | POA: Insufficient documentation

## 2015-04-23 DIAGNOSIS — Z9049 Acquired absence of other specified parts of digestive tract: Secondary | ICD-10-CM | POA: Insufficient documentation

## 2015-04-23 HISTORY — PX: HOLMIUM LASER APPLICATION: SHX5852

## 2015-04-23 HISTORY — PX: CYSTOSCOPY WITH RETROGRADE PYELOGRAM, URETEROSCOPY AND STENT PLACEMENT: SHX5789

## 2015-04-23 SURGERY — CYSTOURETEROSCOPY, WITH RETROGRADE PYELOGRAM AND STENT INSERTION
Anesthesia: General | Laterality: Bilateral

## 2015-04-23 MED ORDER — ONDANSETRON HCL 4 MG/2ML IJ SOLN
INTRAMUSCULAR | Status: DC | PRN
  Administered 2015-04-23: 4 mg via INTRAVENOUS

## 2015-04-23 MED ORDER — FENTANYL CITRATE (PF) 100 MCG/2ML IJ SOLN
INTRAMUSCULAR | Status: DC | PRN
  Administered 2015-04-23 (×6): 50 ug via INTRAVENOUS

## 2015-04-23 MED ORDER — EPHEDRINE SULFATE 50 MG/ML IJ SOLN
INTRAMUSCULAR | Status: AC
  Filled 2015-04-23: qty 1

## 2015-04-23 MED ORDER — LIDOCAINE HCL (CARDIAC) 20 MG/ML IV SOLN
INTRAVENOUS | Status: DC | PRN
  Administered 2015-04-23: 100 mg via INTRAVENOUS

## 2015-04-23 MED ORDER — LACTATED RINGERS IV SOLN
INTRAVENOUS | Status: DC | PRN
  Administered 2015-04-23 (×2): via INTRAVENOUS

## 2015-04-23 MED ORDER — MIDAZOLAM HCL 2 MG/2ML IJ SOLN
INTRAMUSCULAR | Status: AC
  Filled 2015-04-23: qty 2

## 2015-04-23 MED ORDER — LIDOCAINE HCL 2 % EX GEL
CUTANEOUS | Status: AC
  Filled 2015-04-23: qty 10

## 2015-04-23 MED ORDER — PROPOFOL 10 MG/ML IV BOLUS
INTRAVENOUS | Status: AC
  Filled 2015-04-23: qty 20

## 2015-04-23 MED ORDER — HYDROMORPHONE HCL 1 MG/ML IJ SOLN
0.2500 mg | INTRAMUSCULAR | Status: DC | PRN
  Administered 2015-04-23 (×2): 0.5 mg via INTRAVENOUS

## 2015-04-23 MED ORDER — GLYCOPYRROLATE 0.2 MG/ML IJ SOLN
INTRAMUSCULAR | Status: DC | PRN
  Administered 2015-04-23 (×2): .05 mg via INTRAVENOUS

## 2015-04-23 MED ORDER — HYDROMORPHONE HCL 2 MG PO TABS: 2.0000 mg | ORAL_TABLET | Freq: Four times a day (QID) | ORAL | Status: DC | PRN

## 2015-04-23 MED ORDER — FENTANYL CITRATE (PF) 100 MCG/2ML IJ SOLN
INTRAMUSCULAR | Status: AC
  Filled 2015-04-23: qty 2

## 2015-04-23 MED ORDER — ESMOLOL HCL 10 MG/ML IV SOLN
INTRAVENOUS | Status: DC | PRN
  Administered 2015-04-23: 10 mg via INTRAVENOUS

## 2015-04-23 MED ORDER — KETOROLAC TROMETHAMINE 30 MG/ML IJ SOLN
30.0000 mg | Freq: Once | INTRAMUSCULAR | Status: AC
  Administered 2015-04-23: 30 mg via INTRAVENOUS
  Filled 2015-04-23: qty 1

## 2015-04-23 MED ORDER — HYDROMORPHONE HCL 1 MG/ML IJ SOLN
INTRAMUSCULAR | Status: AC
  Filled 2015-04-23: qty 1

## 2015-04-23 MED ORDER — HYDROMORPHONE HCL 1 MG/ML IJ SOLN: 0.2500 mg | INTRAMUSCULAR | Status: DC | PRN

## 2015-04-23 MED ORDER — OXYBUTYNIN CHLORIDE 5 MG PO TABS
ORAL_TABLET | ORAL | Status: DC
Start: 2015-04-23 — End: 2015-04-23
  Filled 2015-04-23: qty 1

## 2015-04-23 MED ORDER — IOHEXOL 300 MG/ML  SOLN
INTRAMUSCULAR | Status: DC | PRN
  Administered 2015-04-23: 30 mL

## 2015-04-23 MED ORDER — DEXAMETHASONE SODIUM PHOSPHATE 10 MG/ML IJ SOLN
INTRAMUSCULAR | Status: AC
  Filled 2015-04-23: qty 1

## 2015-04-23 MED ORDER — LIDOCAINE HCL (CARDIAC) 20 MG/ML IV SOLN
INTRAVENOUS | Status: AC
  Filled 2015-04-23: qty 5

## 2015-04-23 MED ORDER — SODIUM CHLORIDE 0.9 % IR SOLN
Status: DC | PRN
  Administered 2015-04-23: 3000 mL via INTRAVESICAL

## 2015-04-23 MED ORDER — PROPOFOL 10 MG/ML IV BOLUS
INTRAVENOUS | Status: DC | PRN
  Administered 2015-04-23: 200 mg via INTRAVENOUS
  Administered 2015-04-23: 25 mg via INTRAVENOUS
  Administered 2015-04-23: 100 mg via INTRAVENOUS

## 2015-04-23 MED ORDER — ESMOLOL HCL 10 MG/ML IV SOLN
INTRAVENOUS | Status: AC
  Filled 2015-04-23: qty 10

## 2015-04-23 MED ORDER — OXYBUTYNIN CHLORIDE 5 MG PO TABS
5.0000 mg | ORAL_TABLET | ORAL | Status: AC
  Administered 2015-04-23: 5 mg via ORAL

## 2015-04-23 MED ORDER — SENNOSIDES-DOCUSATE SODIUM 8.6-50 MG PO TABS: 1.0000 | ORAL_TABLET | Freq: Two times a day (BID) | ORAL | Status: DC

## 2015-04-23 MED ORDER — SODIUM CHLORIDE 0.9 % IJ SOLN
INTRAMUSCULAR | Status: AC
  Filled 2015-04-23: qty 10

## 2015-04-23 MED ORDER — SULFAMETHOXAZOLE-TRIMETHOPRIM 800-160 MG PO TABS: 1.0000 | ORAL_TABLET | Freq: Two times a day (BID) | ORAL | Status: DC

## 2015-04-23 MED ORDER — OXYBUTYNIN CHLORIDE 5 MG PO TABS: 5.0000 mg | ORAL_TABLET | Freq: Three times a day (TID) | ORAL | Status: DC | PRN

## 2015-04-23 MED ORDER — HYDROMORPHONE HCL 2 MG PO TABS
2.0000 mg | ORAL_TABLET | ORAL | Status: DC | PRN
  Administered 2015-04-23: 2 mg via ORAL
  Filled 2015-04-23: qty 1

## 2015-04-23 MED ORDER — ONDANSETRON HCL 4 MG/2ML IJ SOLN
INTRAMUSCULAR | Status: AC
  Filled 2015-04-23: qty 2

## 2015-04-23 MED ORDER — DEXAMETHASONE SODIUM PHOSPHATE 10 MG/ML IJ SOLN
INTRAMUSCULAR | Status: DC | PRN
  Administered 2015-04-23: 10 mg via INTRAVENOUS

## 2015-04-23 MED ORDER — MIDAZOLAM HCL 5 MG/5ML IJ SOLN
INTRAMUSCULAR | Status: DC | PRN
  Administered 2015-04-23 (×3): 0.5 mg via INTRAVENOUS
  Administered 2015-04-23: .5 mg via INTRAVENOUS

## 2015-04-23 SURGICAL SUPPLY — 26 items
BAG URINE DRAINAGE (UROLOGICAL SUPPLIES) IMPLANT
BASKET LASER NITINOL 1.9FR (BASKET) ×3 IMPLANT
BASKET STNLS GEMINI 4WIRE 3FR (BASKET) IMPLANT
BASKET ZERO TIP NITINOL 2.4FR (BASKET) IMPLANT
CATH INTERMIT  6FR 70CM (CATHETERS) ×3 IMPLANT
CLOTH BEACON ORANGE TIMEOUT ST (SAFETY) ×3 IMPLANT
ELECT REM PT RETURN 9FT ADLT (ELECTROSURGICAL)
ELECTRODE REM PT RTRN 9FT ADLT (ELECTROSURGICAL) IMPLANT
FIBER LASER FLEXIVA 1000 (UROLOGICAL SUPPLIES) IMPLANT
FIBER LASER FLEXIVA 200 (UROLOGICAL SUPPLIES) ×3 IMPLANT
FIBER LASER FLEXIVA 365 (UROLOGICAL SUPPLIES) IMPLANT
FIBER LASER FLEXIVA 550 (UROLOGICAL SUPPLIES) IMPLANT
FIBER LASER TRAC TIP (UROLOGICAL SUPPLIES) ×3 IMPLANT
GLOVE BIOGEL M STRL SZ7.5 (GLOVE) ×3 IMPLANT
GOWN STRL REUS W/TWL LRG LVL3 (GOWN DISPOSABLE) ×6 IMPLANT
GUIDEWIRE ANG ZIPWIRE 038X150 (WIRE) ×6 IMPLANT
GUIDEWIRE STR DUAL SENSOR (WIRE) ×3 IMPLANT
HOVERMATT HALF SINGLE USE (PATIENT TRANSFER) ×3 IMPLANT
IV NS IRRIG 3000ML ARTHROMATIC (IV SOLUTION) ×3 IMPLANT
PACK CYSTO (CUSTOM PROCEDURE TRAY) ×3 IMPLANT
SHEATH ACCESS URETERAL 24CM (SHEATH) ×3 IMPLANT
STENT POLARIS 5FRX24 (STENTS) ×3 IMPLANT
STENT POLARIS 5FRX26 (STENTS) ×3 IMPLANT
SYRINGE 10CC LL (SYRINGE) IMPLANT
SYRINGE IRR TOOMEY STRL 70CC (SYRINGE) IMPLANT
TUBE FEEDING 8FR 16IN STR KANG (MISCELLANEOUS) ×3 IMPLANT

## 2015-04-23 NOTE — Anesthesia Preprocedure Evaluation (Addendum)
Anesthesia Evaluation  Patient identified by MRN, date of birth, ID band Patient awake    Airway Mallampati: II  TM Distance: >3 FB Neck ROM: Full    Dental   Pulmonary asthma ,  breath sounds clear to auscultation        Cardiovascular hypertension, Rhythm:Regular Rate:Normal     Neuro/Psych    GI/Hepatic negative GI ROS, Neg liver ROS,   Endo/Other  negative endocrine ROS  Renal/GU Renal disease     Musculoskeletal   Abdominal   Peds  Hematology   Anesthesia Other Findings   Reproductive/Obstetrics                          Anesthesia Physical Anesthesia Plan  ASA: III  Anesthesia Plan: General   Post-op Pain Management:    Induction: Intravenous  Airway Management Planned: LMA  Additional Equipment:   Intra-op Plan:   Post-operative Plan: Extubation in OR  Informed Consent: I have reviewed the patients History and Physical, chart, labs and discussed the procedure including the risks, benefits and alternatives for the proposed anesthesia with the patient or authorized representative who has indicated his/her understanding and acceptance.   Dental advisory given  Plan Discussed with: CRNA and Anesthesiologist  Anesthesia Plan Comments:         Anesthesia Quick Evaluation

## 2015-04-23 NOTE — Discharge Instructions (Signed)
1 - You may have urinary urgency (bladder spasms) and bloody urine on / off with stent in place. This is normal.  2 - Call MD or go to ER for fever >102, severe pain / nausea / vomiting not relieved by medications, or acute change in medical status   PATIENT INSTRUCTIONS POST-ANESTHESIA  IMMEDIATELY FOLLOWING SURGERY:  Do not drive or operate machinery for the first twenty four hours after surgery.  Do not make any important decisions for twenty four hours after surgery or while taking narcotic pain medications or sedatives.Do not visit public places today.  If you develop intractable nausea and vomiting or a severe headache please notify your doctor immediately.  FOLLOW-UP:  Please make an appointment with your surgeon as instructed. You do not need to follow up with anesthesia unless specifically instructed to do so.   QUESTIONS?:  Please feel free to call your physician or the hospital operator if you have any questions, and they will be happy to assist you.

## 2015-04-23 NOTE — Anesthesia Postprocedure Evaluation (Signed)
  Anesthesia Post-op Note  Patient: Carol Ortega  Procedure(s) Performed: Procedure(s): CYSTOSCOPY WITH BILATERAL RETROGRADE PYELOGRAM, URETEROSCOPY AND STENT PLACEMENT (Bilateral) HOLMIUM LASER APPLICATION (Bilateral)  Patient Location: PACU  Anesthesia Type:General  Level of Consciousness: awake  Airway and Oxygen Therapy: Patient Spontanous Breathing  Post-op Pain: mild  Post-op Assessment: Post-op Vital signs reviewed  Post-op Vital Signs: Reviewed  Last Vitals:  Filed Vitals:   04/23/15 0919  BP: 162/107  Pulse: 100  Temp: 36.6 C  Resp: 28    Complications: No apparent anesthesia complications

## 2015-04-23 NOTE — Progress Notes (Signed)
Call to Dr Tresa Moore for PRN pain med. Patient is still sleepy but not much relief from stents

## 2015-04-23 NOTE — H&P (Signed)
Carol Ortega is an 44 y.o. female.    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  Her PCP is Carol AbrahamsHer endocrinologist is Carol Ortega  Today Carol Ortega to proceed with bilateral ureteroscopic stone manipulation for goal of stone free. No interval fevers. Most recent UCX non-clonal low growth. She has been on bactrim proph prior.    Past Medical History  Diagnosis Date  . Kidney stone   . UTI (urinary tract infection)   . Obesity   . Brain tumor 2014    tx with radiation  . Radiation     Brain tumor  . Hypertension   . Pulmonary emboli 2011  . Parathyroid abnormality   . Vitamin D deficiency   . Complication of anesthesia 1999    lung collapse after surgery for gallbladder  . Asthma     seasonal  . Headache     migraines  . Depression   . Anxiety     Past Surgical History  Procedure Laterality Date  . Kidney stone removal    . Lithotripsy    . Cystoscopy w/ ureteroscopy    . Cystoscopy with stent placement Left 01/12/2014    Procedure: CYSTOSCOPY WITH STENT PLACEMENT left retrograde;  Surgeon: John Irine Seal  Location: [facility];  Service: Urology;  Laterality: Left;  . Surgery for,ectopic pregnancy  2011     1 fallopian tube rupture  . Tubal ligation  2011  . Cholecystectomy  1999  . Cystoscopy with retrograde  pyelogram, ureteroscopy and stent placement Left 01/20/2014    Procedure: LEFT URETEROSCOPY WITH STENT PLACEMENT;  Surgeon: John Irine Seal  Location: [facility];  Service: Urology;  Laterality: Left;  . Holmium laser application Left 01/20/26/2/5366rocedure: HOLMIUM LASER APPLICATION;  Surgeon: John Irine Seal  Location: [facility];  Service: Urology;  Laterality: Left;    Family History  Problem Relation Age of Onset  . Bell's palsy Mother   . Heart failure Mother   . Asthma Father   . Hypertension Father    Social History:  reports that she has never smoked. She has never used smokeless tobacco. She reports that she drinks alcohol. She reports that she does not use illicit drugs.  Allergies:  Allergies  Allergen Reactions  . Bee Venom Swelling  . Eggs Or Egg-Derived Products Anaphylaxis  . Other Anaphylaxis    Surgical dye.. Pt states ok to take if previously taken benadryl or prednisone  . Penicillins Hives  . Latex Rash  . Oxycodone-Acetaminophen Itching  . Penicillin G Rash    No prescriptions prior to admission    Results for orders placed or performed during the hospital encounter of 04/22/15 (from the past 48 hour(s))  CBC     Status: None   Collection Time: 04/22/15 10:55 AM  Result Value Ref Range   WBC 8.7 4.0 - 10.5  K/uL   RBC 4.59 3.87 - 5.11 MIL/uL   Hemoglobin 12.8 12.0 - 15.0 g/dL   HCT 41.1 36.0 - 46.0 %   MCV 89.5 78.0 - 100.0 fL   MCH 27.9 26.0 - 34.0 pg   MCHC 31.1 30.0 - 36.0 g/dL   RDW 13.6 11.5 - 15.5 %   Platelets 299 150 - 400 K/uL  Basic metabolic panel     Status: Abnormal   Collection Time: 04/22/15 10:55 AM  Result Value Ref Range   Sodium 140 135 - 145 mmol/L   Potassium 4.5 3.5 - 5.1 mmol/L   Chloride 106 101 - 111 mmol/L   CO2 27 22 - 32 mmol/L   Glucose, Bld 103 (H) 70 - 99 mg/dL   BUN 15 6 - 20 mg/dL   Creatinine, Ser 0.86 0.44 - 1.00 mg/dL   Calcium 10.8 (H) 8.9 - 10.3 mg/dL   GFR calc non Af Amer >60 >60 mL/min   GFR calc Af Amer >60  >60 mL/min    Comment: (NOTE) The eGFR has been calculated using the CKD EPI equation. This calculation has not been validated in all clinical situations. eGFR's persistently <90 mL/min signify possible Chronic Kidney Disease.    Anion gap 7 5 - 15  hCG, serum, qualitative     Status: None   Collection Time: 04/22/15 10:55 AM  Result Value Ref Range   Preg, Serum NEGATIVE NEGATIVE    Comment:        THE SENSITIVITY OF THIS METHODOLOGY IS >10 mIU/mL.    No results found.  Review of Systems  Constitutional: Negative.  Negative for fever.  HENT: Negative.   Eyes: Negative.   Respiratory: Negative.   Cardiovascular: Negative.   Gastrointestinal: Negative.   Genitourinary: Negative.   Musculoskeletal: Negative.   Skin: Negative.   Neurological: Negative.   Endo/Heme/Allergies: Negative.   Psychiatric/Behavioral: Negative.     Last menstrual period 03/29/2015. Physical Exam   Assessment/Plan     1 - Recurrent Nephrolithiasis - now with some interval stone growth including symptomatic small Rt UPJ stone. Discussed medical therapy v. Rt sided RUS v. bilaeral URS. SWL not favored due to body habitus.   We rediscussed ureteroscopic stone manipulation with basketing and laser-lithotripsy in detail.  We rediscussed risks including bleeding, infection, damage to kidney / ureter  bladder, rarely loss of kidney. We rediscussed anesthetic risks and rare but serious surgical complications including DVT, PE, MI, and mortality. We specificallyre addressed that in 5-10% of cases a staged approach is required with stenting followed by re-attempt ureteroscopy if anatomy unfavorable.   The patient voiced understanding and wises to proceed today as planned.   2 - Primary Hyperparathyroidism / Hypercalcemia / Medical Stone Disease - strongly encourage follow up with PCP and endocrinologist and maintian excellent hydration. She ahs been very compliant inthis regard.   ,  04/23/2015,  4:45 AM

## 2015-04-23 NOTE — Anesthesia Procedure Notes (Signed)
Procedure Name: LMA Insertion Date/Time: 04/23/2015 7:40 AM Performed by: Ofilia Neas Pre-anesthesia Checklist: Patient identified, Emergency Drugs available, Suction available, Patient being monitored and Timeout performed Patient Re-evaluated:Patient Re-evaluated prior to inductionOxygen Delivery Method: Circle system utilized Preoxygenation: Pre-oxygenation with 100% oxygen Intubation Type: IV induction LMA: LMA with gastric port inserted LMA Size: 4.0 Number of attempts: 2 Tube secured with: Tape Dental Injury: Teeth and Oropharynx as per pre-operative assessment

## 2015-04-23 NOTE — Brief Op Note (Signed)
04/23/2015  9:04 AM  PATIENT:  Carol Ortega  44 y.o. female  PRE-OPERATIVE DIAGNOSIS:  BILATERAL RENAL STONES, FLANK PAIN  POST-OPERATIVE DIAGNOSIS:  BILATERAL RENAL STONES, FLANK PAIN  PROCEDURE:  Procedure(s): CYSTOSCOPY WITH BILATERAL RETROGRADE PYELOGRAM, URETEROSCOPY AND STENT PLACEMENT (Bilateral) HOLMIUM LASER APPLICATION (Bilateral)  SURGEON:  Surgeon(s) and Role:    * Alexis Frock, MD - Primary  PHYSICIAN ASSISTANT:   ASSISTANTS: none   ANESTHESIA:   general  EBL:     BLOOD ADMINISTERED:none  DRAINS: none   LOCAL MEDICATIONS USED:  NONE  SPECIMEN:  Source of Specimen:  bilateral renal stone fragments  DISPOSITION OF SPECIMEN:  [facility] for compositional analysis  COUNTS:  YES  TOURNIQUET:  * No tourniquets in log *  DICTATION: .Other Dictation: Dictation Number [id number]  PLAN OF CARE: Discharge to home after PACU  PATIENT DISPOSITION:  PACU - hemodynamically stable.   Delay start of Pharmacological VTE agent (>24hrs) due to surgical blood loss or risk of bleeding: not applicable

## 2015-04-23 NOTE — Progress Notes (Signed)
PACU note----pt c/o pain with stent, med given and also pt given Ditropan per her prescription for home

## 2015-04-23 NOTE — Transfer of Care (Signed)
Immediate Anesthesia Transfer of Care Note  Patient: Carol Ortega  Procedure(s) Performed: Procedure(s): CYSTOSCOPY WITH BILATERAL RETROGRADE PYELOGRAM, URETEROSCOPY AND STENT PLACEMENT (Bilateral) HOLMIUM LASER APPLICATION (Bilateral)  Patient Location: PACU  Anesthesia Type:General  Level of Consciousness: awake, patient cooperative, lethargic and responds to stimulation  Airway & Oxygen Therapy: Patient Spontanous Breathing and Patient connected to face mask oxygen  Post-op Assessment: Report given to RN, Post -op Vital signs reviewed and stable and Patient moving all extremities  Post vital signs: Reviewed and stable  Last Vitals:  Filed Vitals:   04/23/15 0533  BP: 138/96  Pulse: 96  Temp: 36.6 C  Resp: 18    Complications: No apparent anesthesia complications

## 2015-04-24 NOTE — Op Note (Signed)
Carol Ortega            ACCOUNT NO.:  [id number]  MEDICAL RECORD NO.:  [id number]  LOCATION:  [facility]                         FACILITY:  [facility]  PHYSICIAN:  Alexis Frock, MD     DATE OF BIRTH:  1971/04/06  DATE OF PROCEDURE: 04/23/2015                                OPERATIVE REPORT   DIAGNOSES: 1. Bilateral renal stones, right renal colic. 2. Secondary hyperparathyroidism.  PROCEDURES: 1. Cystoscopy with bilateral retrograde pyelograms and interpretation. 2. Bilateral ureteroscopy with laser lithotripsy. 3. Insertion of bilateral ureteral stents.  Right, 5 x 26, Polaris.     Left, 5 x 24, Polaris.  No tethers.  ESTIMATED BLOOD LOSS:  Nil.  COMPLICATIONS:  None.  SPECIMENS:  Bilateral renal stone fragments for compositional analysis.  FINDINGS: 1. Unremarkable bilateral pyelograms. 2. Solitary right upper pole renal stone, this was free floating,     possibly causing some ball-valving intermittently. 3. Small papillary tip calcifications on the right. 4. Complete resolution of all stone fragments larger than 1/3rd mm     following laser lithotripsy and basketing on the right. 5. Left upper-mid stone. 6. Complete resolution of all stone fragments larger than 1/3rd mm     following laser lithotripsy and basket extraction on the left.  INDICATIONS:  Carol Ortega is a 44 year old lady with history of morbid obesity, secondary hyperparathyroidism due to vitamin D deficiency, has had recurrent nephrolithiasis.  She underwent evaluation in the emergency room recently with a prodrome of right colicky flank pain, was found to have a likely very punctate right proximal ureteral stone as well as recurrent bilateral renal stones.  Her symptoms were persistent despite medical therapy.  Options were discussed with Therapy including continued medical therapy versus shockwave lithotripsy versus ureteroscopic stone manipulation, unilateral versus bilateral and she wished to  proceed with the latter with gallstone free.  Informed consent was obtained and placed in the medical record.  PROCEDURE IN DETAIL:  The patient being Carol Ortega, was verified. Procedure being bilateral ureteroscopic stone manipulation was confirmed.  Procedure was carried out.  Time-out was performed. Intravenous antibiotics were administered.  General LMA anesthesia was introduced.  The patient was very carefully placed into a low-lithotomy position employing sequential compression devices.  Sterile field was created by prepping and draping the patient's vagina, introitus, and proximal thighs using iodine x3.  Next, cystourethroscopy was performed using a 23-French rigid cystoscope with 30-degree offset lens. Inspection of the urinary bladder revealed no diverticula, calcifications, or papular lesions.  The right ureteral orifice was cannulated with a 6-French end-hole catheter and right retrograde pyelogram was obtained.  Right retrograde pyelogram demonstrated a single right ureter with single-system right kidney.  No obvious filling defects or narrowing noted.  A 0.038 zip wire was advanced at the level of the upper pole and set aside as a safety wire.  Next, semi-rigid ureteroscopy was performed to the distal two-thirds of the right ureter alongside a separate Sensor working wire.  No mucosal abnormalities were found.  An 8-French feeding tube was placed in urinary bladder for pressure release.  Next, the semi- rigid ureteroscope was exchanged for the 12/14, 24-cm ureteral access sheath at the level of proximal ureter using continuous  fluoroscopic guidance.  Next, flexible digital ureteroscopy was performed using dual- channel digital ureteroscope following inspection of the proximal right ureter and systematic inspection of the right kidney x2.  There was a single dominant calcification that was free floating, likely represent intermittent ball-valving and cause of a right  flank pain.  There was additional very small punctate papillary tip calcifications throughout the kidney with dominant stone-free position in upper pole calyx and holmium laser energy was applied to the stone using settings of 0.2 joules and 20 Hz dusting approximately 50% of volume of the stent.  The remaining volume of the stone was fragmented into pieces, approximately 2-3 mm and these were grasped with an Escape basket and brought out in their entirety, set aside for compositional analysis.  Following these maneuvers, there was complete resolution of all stone fragments larger than 1/3rd mm.  There was excellent hemostasis.  Additional holmium laser energy was applied to the papillary tip calcifications, which were smaller than 1/3rd mm and these were allowed to then release to irrigate out.  The access sheath was removed under continuous uteroscopic vision, and no significant mucosal abnormalities were found.  The safety wire was left in place at this point.  Next, the left ureteral orifice was cannulated with a 6-French end-hole catheter and left retrograde pyelogram was obtained.  Left retrograde pyelogram demonstrated a single left ureter with single- system left kidney.  No filling defects or narrowing noted.  A 0.038 zip wire was advanced at the level of the upper pole and set aside as a safety wire.  Next, semi-rigid ureteroscopy was performed at the distal fourth-fifth of the left ureter alongside a separate Sensor working wire.  No mucosal abnormalities were found.  The semi-rigid scope was exchanged for the 24-cm ureteral access sheath.  Next, flexible digital ureteroscopy was performed in the left side, and inspection of the proximal left ureter and systematic inspection of the left kidney x3.  A single dominant calcification was noted in the upper and mid pole calyx, this was quite large and estimated approximately 1 cm.  This appeared to be much too large for simple  basketing.  As such, holmium laser energy was applied to the stone using settings of 0.2 joules and 20 hertz, dusting approximately 50% of volume of the stone.  The remaining fragments were amenable to basketing.  These were sequentially removed in their entirety and set aside for compositional analysis.  Following these maneuvers, there was complete resolution of all stone fragments larger than 1/3rd mm.  Excellent hemostasis.  No evidence of renal perforation.  The access sheath was removed under continuous uteroscopic vision and no significant mucosal abnormalities were found.  As the patient had undergone bilateral procedure today and large volume stones with significant creation of small dust fragments, it was felt that intervening stenting was clearly warranted.  As such, a new 6 x 24 Polaris-type stent was placed in the left side over the remaining safety wire.  Good proximal and distal deployment were noted fluoroscopically. Similarly, a right ureteral stent was placed, 5 x 26, Polaris.  Good proximal and distal deployment were noted.  The bladder was emptied per cystoscope.  Procedure was then terminated.  The patient tolerated the procedure well.  There were no immediate periprocedural complications. The patient was taken to the postanesthesia care unit stable condition.          ______________________________ Alexis Frock, MD     TM/MEDQ  D:  04/23/2015  T:  04/24/2015  Job:  [id number]

## 2015-04-26 ENCOUNTER — Encounter (HOSPITAL_COMMUNITY): Payer: Self-pay | Admitting: Urology

## 2015-05-05 ENCOUNTER — Emergency Department (HOSPITAL_COMMUNITY)
Admission: EM | Admit: 2015-05-05 | Discharge: 2015-05-05 | Disposition: A | Discharge status: Home / Self Care | Payer: Medicaid Other | Attending: Emergency Medicine | Admitting: Emergency Medicine

## 2015-05-05 ENCOUNTER — Encounter (HOSPITAL_COMMUNITY): Payer: Self-pay | Admitting: Emergency Medicine

## 2015-05-05 DIAGNOSIS — Z9049 Acquired absence of other specified parts of digestive tract: Secondary | ICD-10-CM | POA: Diagnosis not present

## 2015-05-05 DIAGNOSIS — E559 Vitamin D deficiency, unspecified: Secondary | ICD-10-CM | POA: Insufficient documentation

## 2015-05-05 DIAGNOSIS — J45909 Unspecified asthma, uncomplicated: Secondary | ICD-10-CM | POA: Diagnosis not present

## 2015-05-05 DIAGNOSIS — R1030 Lower abdominal pain, unspecified: Secondary | ICD-10-CM | POA: Diagnosis present

## 2015-05-05 DIAGNOSIS — E669 Obesity, unspecified: Secondary | ICD-10-CM | POA: Insufficient documentation

## 2015-05-05 DIAGNOSIS — Z91040 Latex allergy status: Secondary | ICD-10-CM | POA: Diagnosis not present

## 2015-05-05 DIAGNOSIS — F419 Anxiety disorder, unspecified: Secondary | ICD-10-CM | POA: Diagnosis not present

## 2015-05-05 DIAGNOSIS — F329 Major depressive disorder, single episode, unspecified: Secondary | ICD-10-CM | POA: Insufficient documentation

## 2015-05-05 DIAGNOSIS — Z9851 Tubal ligation status: Secondary | ICD-10-CM | POA: Insufficient documentation

## 2015-05-05 DIAGNOSIS — I10 Essential (primary) hypertension: Secondary | ICD-10-CM | POA: Diagnosis not present

## 2015-05-05 DIAGNOSIS — Z880 Allergy status to penicillin: Secondary | ICD-10-CM | POA: Diagnosis not present

## 2015-05-05 DIAGNOSIS — Z86011 Personal history of benign neoplasm of the brain: Secondary | ICD-10-CM | POA: Insufficient documentation

## 2015-05-05 DIAGNOSIS — N390 Urinary tract infection, site not specified: Secondary | ICD-10-CM | POA: Insufficient documentation

## 2015-05-05 DIAGNOSIS — Z79899 Other long term (current) drug therapy: Secondary | ICD-10-CM | POA: Insufficient documentation

## 2015-05-05 DIAGNOSIS — Z87442 Personal history of urinary calculi: Secondary | ICD-10-CM | POA: Diagnosis not present

## 2015-05-05 LAB — COMPREHENSIVE METABOLIC PANEL
ALT: 27 U/L (ref 14–54)
AST: 23 U/L (ref 15–41)
Albumin: 3.7 g/dL (ref 3.5–5.0)
Alkaline Phosphatase: 111 U/L (ref 38–126)
Anion gap: 10 (ref 5–15)
BUN: 8 mg/dL (ref 6–20)
CO2: 24 mmol/L (ref 22–32)
Calcium: 10.6 mg/dL — ABNORMAL HIGH (ref 8.9–10.3)
Chloride: 104 mmol/L (ref 101–111)
Creatinine, Ser: 0.92 mg/dL (ref 0.44–1.00)
GFR calc Af Amer: >60 mL/min (ref >60)
GFR calc non Af Amer: >60 mL/min (ref >60)
Glucose, Bld: 106 mg/dL — ABNORMAL HIGH (ref 65–99)
Potassium: 3.8 mmol/L (ref 3.5–5.1)
Sodium: 138 mmol/L (ref 135–145)
Total Bilirubin: 0.4 mg/dL (ref 0.3–1.2)
Total Protein: 7.4 g/dL (ref 6.5–8.1)

## 2015-05-05 LAB — URINE MICROSCOPIC-ADD ON

## 2015-05-05 LAB — CBC WITH DIFFERENTIAL/PLATELET
Basophils Absolute: 0.1 10*3/uL (ref 0.0–0.1)
Basophils Relative: 1 % (ref 0–1)
Eosinophils Absolute: 0.3 10*3/uL (ref 0.0–0.7)
Eosinophils Relative: 4 % (ref 0–5)
HCT: 39.4 % (ref 36.0–46.0)
Hemoglobin: 12.3 g/dL (ref 12.0–15.0)
Lymphocytes Relative: 30 % (ref 12–46)
Lymphs Abs: 2.2 10*3/uL (ref 0.7–4.0)
MCH: 27.5 pg (ref 26.0–34.0)
MCHC: 31.2 g/dL (ref 30.0–36.0)
MCV: 87.9 fL (ref 78.0–100.0)
Monocytes Absolute: 0.7 10*3/uL (ref 0.1–1.0)
Monocytes Relative: 10 % (ref 3–12)
Neutro Abs: 3.9 10*3/uL (ref 1.7–7.7)
Neutrophils Relative %: 55 % (ref 43–77)
Platelets: 310 10*3/uL (ref 150–400)
RBC: 4.48 MIL/uL (ref 3.87–5.11)
RDW: 13.6 % (ref 11.5–15.5)
WBC: 7.1 10*3/uL (ref 4.0–10.5)

## 2015-05-05 LAB — URINALYSIS, ROUTINE W REFLEX MICROSCOPIC
Glucose, UA: NEGATIVE mg/dL
Ketones, ur: 15 mg/dL — AB
Nitrite: POSITIVE — AB
PH: 5.5 (ref 5.0–8.0)
Specific Gravity, Urine: 1.018 (ref 1.005–1.030)
Urobilinogen, UA: 1 mg/dL (ref 0.0–1.0)

## 2015-05-05 MED ORDER — HYDROMORPHONE HCL 1 MG/ML IJ SOLN
1.0000 mg | Freq: Once | INTRAMUSCULAR | Status: AC
  Administered 2015-05-05: 1 mg via INTRAVENOUS
  Filled 2015-05-05: qty 1

## 2015-05-05 MED ORDER — DEXTROSE 5 % IV SOLN
1.0000 g | Freq: Once | INTRAVENOUS | Status: AC
  Administered 2015-05-05: 1 g via INTRAVENOUS
  Filled 2015-05-05: qty 10

## 2015-05-05 MED ORDER — OXYCODONE-ACETAMINOPHEN 5-325 MG PO TABS: 1.0000 | ORAL_TABLET | Freq: Four times a day (QID) | ORAL | Status: DC | PRN

## 2015-05-05 MED ORDER — ONDANSETRON HCL 4 MG PO TABS: 4.0000 mg | ORAL_TABLET | Freq: Four times a day (QID) | ORAL | Status: DC

## 2015-05-05 MED ORDER — ONDANSETRON HCL 4 MG/2ML IJ SOLN
4.0000 mg | Freq: Once | INTRAMUSCULAR | Status: AC
  Administered 2015-05-05: 4 mg via INTRAVENOUS
  Filled 2015-05-05: qty 2

## 2015-05-05 MED ORDER — CIPROFLOXACIN HCL 500 MG PO TABS: 500.0000 mg | ORAL_TABLET | Freq: Two times a day (BID) | ORAL | Status: DC

## 2015-05-05 NOTE — ED Notes (Signed)
Pt states that she had bilateral ureteral stents placed on 5/6 and has had worsening pain ever since. Dr. Tresa Moore told her to come in if the pain got too bad. Alert and oriented.

## 2015-05-05 NOTE — Discharge Instructions (Signed)
Please use Percocet as needed for breakthrough pain, ibuprofen as needed for moderate pain, Zofran as needed for nausea. Please monitor for worsening signs or symptoms, follow-up immediately if any present. Please contact Dr. Tresa Moore tomorrow morning and inform them of your visit and all relevant data including urinary tract infection and antibiotic therapy.

## 2015-05-05 NOTE — ED Provider Notes (Signed)
CSN: [id number]     Arrival date & time 05/05/15  1722 History   First MD Initiated Contact with Patient 05/05/15 1735     Chief Complaint  Patient presents with  . Flank Pain   HPI   44 year old female with a hisotry of of kidney stones cystoscopy with bilateral retrograde pyelograms, Bilateral ureteroscopy with laser lithotripsy Insertion of bilateral ureteral stents. Right, 5 x 26, Polaris. left, 5 x 24  on 04/23/15. Pt reports that since her surgery she's had bilateral flank pain and lower abdominal pain. Patient reports to previous surgeries reports this pain is similar to that with an increase in intensity. Patient reports that she's been using at home Dilaudid as needed for pain, but ran out yesterday. She reports that she contacted Dr. Tresa Moore who instructed to use ibuprofen and follow up with her previously scheduled evaluation in 1 week. Patient reports nausea, and has been unable to maintain by mouth intake. Patient denies fever, chills, hematuria, discharge, or any other concerning signs or symptoms.   Past Medical History  Diagnosis Date  . Kidney stone   . UTI (urinary tract infection)   . Obesity   . Brain tumor 2014    tx with radiation  . Radiation     Brain tumor  . Hypertension   . Pulmonary emboli 2011  . Parathyroid abnormality   . Vitamin D deficiency   . Complication of anesthesia 1999    lung collapse after surgery for gallbladder  . Asthma     seasonal  . Headache     migraines  . Depression   . Anxiety    Past Surgical History  Procedure Laterality Date  . Kidney stone removal    . Lithotripsy    . Cystoscopy w/ ureteroscopy    . Cystoscopy with stent placement Left 01/12/2014    Procedure: CYSTOSCOPY WITH STENT PLACEMENT left retrograde;  Surgeon: Irine Seal, MD;  Location: [facility];  Service: Urology;  Laterality: Left;  . Surgery for,ectopic pregnancy  2011     1 fallopian tube rupture  . Tubal ligation  2011  . Cholecystectomy  1999  . Cystoscopy  with retrograde pyelogram, ureteroscopy and stent placement Left 01/20/2014    Procedure: LEFT URETEROSCOPY WITH STENT PLACEMENT;  Surgeon: Irine Seal, MD;  Location: [facility];  Service: Urology;  Laterality: Left;  . Holmium laser application Left 4/0/9811    Procedure: HOLMIUM LASER APPLICATION;  Surgeon: Irine Seal, MD;  Location: [facility];  Service: Urology;  Laterality: Left;  . Cystoscopy with retrograde pyelogram, ureteroscopy and stent placement Bilateral 04/23/2015    Procedure: CYSTOSCOPY WITH BILATERAL RETROGRADE PYELOGRAM, URETEROSCOPY AND STENT PLACEMENT;  Surgeon: Alexis Frock, MD;  Location: [facility];  Service: Urology;  Laterality: Bilateral;  . Holmium laser application Bilateral 08/18/4781    Procedure: HOLMIUM LASER APPLICATION;  Surgeon: Alexis Frock, MD;  Location: [facility];  Service: Urology;  Laterality: Bilateral;   Family History  Problem Relation Age of Onset  . Bell's palsy Mother   . Heart failure Mother   . Asthma Father   . Hypertension Father    History  Substance Use Topics  . Smoking status: Never Smoker   . Smokeless tobacco: Never Used  . Alcohol Use: Yes     Comment: social only   OB History    No data available     Review of Systems  All other systems reviewed and are negative.   Allergies  Bee venom; Eggs or egg-derived products;  Other; Penicillins; Latex; Oxycodone-acetaminophen; and Penicillin g  Home Medications   Prior to Admission medications   Medication Sig Start Date End Date Taking? Authorizing Provider  albuterol (PROVENTIL HFA;VENTOLIN HFA) 108 (90 BASE) MCG/ACT inhaler Inhale 2 puffs into the lungs every 6 (six) hours as needed for wheezing or shortness of breath.     Historical Provider, MD  busPIRone (BUSPAR) 5 MG tablet Take 5 mg by mouth 3 (three) times daily.     Historical Provider, MD  HYDROmorphone (DILAUDID) 2 MG tablet Take 1 tablet (2 mg total) by mouth every 6 (six) hours as needed for moderate pain or severe pain.  Postoperatively 04/23/15   Alexis Frock, MD  lisinopril-hydrochlorothiazide (PRINZIDE,ZESTORETIC) 20-12.5 MG per tablet Take 1 tablet by mouth every evening.     Historical Provider, MD  oxybutynin (DITROPAN) 5 MG tablet Take 1 tablet (5 mg total) by mouth every 8 (eight) hours as needed for bladder spasms. / stent discomfort 04/23/15   Alexis Frock, MD  senna-docusate (SENOKOT-S) 8.6-50 MG per tablet Take 1 tablet by mouth 2 (two) times daily. While taking pain meds to prevent constipation 04/23/15   Alexis Frock, MD  sulfamethoxazole-trimethoprim (BACTRIM DS,SEPTRA DS) 800-160 MG per tablet Take 1 tablet by mouth 2 (two) times daily. X 3 days, begin day prior to next Urology appointment. 04/23/15   Alexis Frock, MD  Vitamin D, Ergocalciferol, (DRISDOL) 50000 UNITS CAPS capsule Take 1 capsule (50,000 Units total) by mouth every 7 (seven) days. Patient taking differently: Take 50,000 Units by mouth every Wednesday.  03/09/15   Philemon Kingdom, MD   BP 131/85 mmHg  Pulse 93  Temp(Src) 97.7 F (36.5 C) (Oral)  SpO2 99%  LMP 04/22/2015 Physical Exam  Constitutional: She is oriented to person, place, and time. She appears well-developed and well-nourished.  HENT:  Head: Normocephalic and atraumatic.  Eyes: Pupils are equal, round, and reactive to light.  Neck: Normal range of motion. Neck supple. No JVD present. No tracheal deviation present. No thyromegaly present.  Cardiovascular: Normal rate, regular rhythm, normal heart sounds and intact distal pulses.  Exam reveals no gallop and no friction rub.   No murmur heard. Pulmonary/Chest: Effort normal and breath sounds normal. No stridor. No respiratory distress. She has no wheezes. She has no rales. She exhibits no tenderness.  Abdominal: There is CVA tenderness.  Musculoskeletal: Normal range of motion.  Lymphadenopathy:    She has no cervical adenopathy.  Neurological: She is alert and oriented to person, place, and time. Coordination normal.   Skin: Skin is warm and dry.  Psychiatric: She has a normal mood and affect. Her behavior is normal. Judgment and thought content normal.  Nursing note and vitals reviewed.   ED Course  Procedures (including critical care time) Labs Review Labs Reviewed  URINALYSIS, ROUTINE W REFLEX MICROSCOPIC    Imaging Review No results found.   EKG Interpretation None      MDCBC, CMP, urine culture, urinalysis CBCM   Final diagnoses:  UTI (lower urinary tract infection)    Labs: CBC, CMP, urine culture, urinalysis significant for urinary tract infection  Imaging: None indicated  Consults: None  Therapeutics: Dilaudid, Zofran  Assessment: Urinary tract infection  Plan: Patient presents with significant bilateral flank pain and a urinary tract infection. She recently had stents placed, this is likely where her pain is coming from. Urinalysis showed urinary tract infection that could be contributing to this. Pain was managed here in the ED, short course of oral pain medication  was given for discharge. Patient was instructed to use antibiotics as directed contact Dr. Tresa Moore and inform him of her visit today and all relevant data. Patient has a scheduled follow-up visit with him, encouraged follow-up sooner than next week as best when it was scheduled. Patient was amenable to this plan, and strict return precautions were given the event new or worsening signs or symptoms presented, she had no further questions or concerns at the time of discharge and was visibly improved since her initial evaluation. Patient was able to urinate without difficulty, ambulate without difficulty, and had no further questions or concerns at the time of discharge. She verbalized her understanding and agreement to the plan.      Okey Regal, PA-C 05/07/15 [id number]  Pamella Pert, MD 05/07/15 [phone]

## 2015-05-06 LAB — URINE CULTURE: Colony Count: 15000

## 2015-05-30 ENCOUNTER — Emergency Department (HOSPITAL_COMMUNITY)
Admission: EM | Admit: 2015-05-30 | Discharge: 2015-05-30 | Disposition: A | Discharge status: Home / Self Care | Payer: Medicaid Other | Attending: Emergency Medicine | Admitting: Emergency Medicine

## 2015-05-30 ENCOUNTER — Encounter (HOSPITAL_COMMUNITY): Payer: Self-pay | Admitting: Emergency Medicine

## 2015-05-30 ENCOUNTER — Emergency Department (HOSPITAL_COMMUNITY): Payer: Medicaid Other

## 2015-05-30 DIAGNOSIS — Z91040 Latex allergy status: Secondary | ICD-10-CM | POA: Diagnosis not present

## 2015-05-30 DIAGNOSIS — Z923 Personal history of irradiation: Secondary | ICD-10-CM | POA: Diagnosis not present

## 2015-05-30 DIAGNOSIS — I10 Essential (primary) hypertension: Secondary | ICD-10-CM | POA: Insufficient documentation

## 2015-05-30 DIAGNOSIS — J45909 Unspecified asthma, uncomplicated: Secondary | ICD-10-CM | POA: Diagnosis not present

## 2015-05-30 DIAGNOSIS — N23 Unspecified renal colic: Secondary | ICD-10-CM | POA: Diagnosis not present

## 2015-05-30 DIAGNOSIS — Z880 Allergy status to penicillin: Secondary | ICD-10-CM | POA: Insufficient documentation

## 2015-05-30 DIAGNOSIS — Z87440 Personal history of urinary (tract) infections: Secondary | ICD-10-CM | POA: Insufficient documentation

## 2015-05-30 DIAGNOSIS — F419 Anxiety disorder, unspecified: Secondary | ICD-10-CM | POA: Insufficient documentation

## 2015-05-30 DIAGNOSIS — E669 Obesity, unspecified: Secondary | ICD-10-CM | POA: Insufficient documentation

## 2015-05-30 DIAGNOSIS — N1330 Unspecified hydronephrosis: Secondary | ICD-10-CM

## 2015-05-30 DIAGNOSIS — Z3202 Encounter for pregnancy test, result negative: Secondary | ICD-10-CM | POA: Insufficient documentation

## 2015-05-30 DIAGNOSIS — Z87442 Personal history of urinary calculi: Secondary | ICD-10-CM | POA: Insufficient documentation

## 2015-05-30 DIAGNOSIS — Z85841 Personal history of malignant neoplasm of brain: Secondary | ICD-10-CM | POA: Insufficient documentation

## 2015-05-30 DIAGNOSIS — Z79899 Other long term (current) drug therapy: Secondary | ICD-10-CM | POA: Diagnosis not present

## 2015-05-30 DIAGNOSIS — Z86711 Personal history of pulmonary embolism: Secondary | ICD-10-CM | POA: Insufficient documentation

## 2015-05-30 DIAGNOSIS — Z9851 Tubal ligation status: Secondary | ICD-10-CM | POA: Insufficient documentation

## 2015-05-30 DIAGNOSIS — R109 Unspecified abdominal pain: Secondary | ICD-10-CM | POA: Diagnosis present

## 2015-05-30 DIAGNOSIS — F329 Major depressive disorder, single episode, unspecified: Secondary | ICD-10-CM | POA: Insufficient documentation

## 2015-05-30 DIAGNOSIS — Z9049 Acquired absence of other specified parts of digestive tract: Secondary | ICD-10-CM | POA: Insufficient documentation

## 2015-05-30 DIAGNOSIS — E559 Vitamin D deficiency, unspecified: Secondary | ICD-10-CM | POA: Diagnosis not present

## 2015-05-30 LAB — URINE MICROSCOPIC-ADD ON

## 2015-05-30 LAB — URINALYSIS, ROUTINE W REFLEX MICROSCOPIC
Bilirubin Urine: NEGATIVE
Glucose, UA: NEGATIVE mg/dL
Hgb urine dipstick: NEGATIVE
KETONES UR: NEGATIVE mg/dL
Leukocytes, UA: NEGATIVE
NITRITE: NEGATIVE
PH: 7.5 (ref 5.0–8.0)
Protein, ur: NEGATIVE mg/dL
Specific Gravity, Urine: 1.014 (ref 1.005–1.030)
Urobilinogen, UA: 1 mg/dL (ref 0.0–1.0)

## 2015-05-30 LAB — CBC WITH DIFFERENTIAL/PLATELET
BASOS PCT: 0 % (ref 0–1)
Basophils Absolute: 0 10*3/uL (ref 0.0–0.1)
EOS PCT: 1 % (ref 0–5)
Eosinophils Absolute: 0.1 10*3/uL (ref 0.0–0.7)
HCT: 39.7 % (ref 36.0–46.0)
Hemoglobin: 12.3 g/dL (ref 12.0–15.0)
Lymphocytes Relative: 17 % (ref 12–46)
Lymphs Abs: 1.5 10*3/uL (ref 0.7–4.0)
MCH: 27.3 pg (ref 26.0–34.0)
MCHC: 31 g/dL (ref 30.0–36.0)
MCV: 88.2 fL (ref 78.0–100.0)
Monocytes Absolute: 1.3 10*3/uL — ABNORMAL HIGH (ref 0.1–1.0)
Monocytes Relative: 14 % — ABNORMAL HIGH (ref 3–12)
Neutro Abs: 6.3 10*3/uL (ref 1.7–7.7)
Neutrophils Relative %: 68 % (ref 43–77)
Platelets: 351 10*3/uL (ref 150–400)
RBC: 4.5 MIL/uL (ref 3.87–5.11)
RDW: 14 % (ref 11.5–15.5)
WBC: 9.2 10*3/uL (ref 4.0–10.5)

## 2015-05-30 LAB — COMPREHENSIVE METABOLIC PANEL
ALBUMIN: 3.7 g/dL (ref 3.5–5.0)
ALT: 21 U/L (ref 14–54)
AST: 30 U/L (ref 15–41)
Alkaline Phosphatase: 121 U/L (ref 38–126)
Anion gap: 6 (ref 5–15)
BUN: 11 mg/dL (ref 6–20)
CALCIUM: 10.1 mg/dL (ref 8.9–10.3)
CO2: 25 mmol/L (ref 22–32)
CREATININE: 0.89 mg/dL (ref 0.44–1.00)
Chloride: 107 mmol/L (ref 101–111)
GFR calc Af Amer: >60 mL/min (ref >60)
GFR calc non Af Amer: >60 mL/min (ref >60)
GLUCOSE: 120 mg/dL — AB (ref 65–99)
POTASSIUM: 4.1 mmol/L (ref 3.5–5.1)
Sodium: 138 mmol/L (ref 135–145)
TOTAL PROTEIN: 7.5 g/dL (ref 6.5–8.1)
Total Bilirubin: 0.9 mg/dL (ref 0.3–1.2)

## 2015-05-30 LAB — POC URINE PREG, ED: Preg Test, Ur: NEGATIVE

## 2015-05-30 LAB — LIPASE, BLOOD: LIPASE: 17 U/L — AB (ref 22–51)

## 2015-05-30 MED ORDER — OXYCODONE-ACETAMINOPHEN 5-325 MG PO TABS: 1.0000 | ORAL_TABLET | Freq: Four times a day (QID) | ORAL | Status: DC | PRN

## 2015-05-30 MED ORDER — DIPHENHYDRAMINE HCL 25 MG PO CAPS
25.0000 mg | ORAL_CAPSULE | Freq: Once | ORAL | Status: AC
  Administered 2015-05-30: 25 mg via ORAL
  Filled 2015-05-30: qty 1

## 2015-05-30 MED ORDER — OXYCODONE-ACETAMINOPHEN 5-325 MG PO TABS
2.0000 | ORAL_TABLET | Freq: Once | ORAL | Status: AC
  Administered 2015-05-30: 2 via ORAL
  Filled 2015-05-30: qty 2

## 2015-05-30 MED ORDER — KETOROLAC TROMETHAMINE 30 MG/ML IJ SOLN
30.0000 mg | Freq: Once | INTRAMUSCULAR | Status: AC
  Administered 2015-05-30: 30 mg via INTRAVENOUS
  Filled 2015-05-30: qty 1

## 2015-05-30 MED ORDER — MORPHINE SULFATE 4 MG/ML IJ SOLN
4.0000 mg | Freq: Once | INTRAMUSCULAR | Status: AC
  Administered 2015-05-30: 4 mg via INTRAVENOUS
  Filled 2015-05-30: qty 1

## 2015-05-30 MED ORDER — ONDANSETRON HCL 4 MG/2ML IJ SOLN
4.0000 mg | Freq: Once | INTRAMUSCULAR | Status: AC
  Administered 2015-05-30: 4 mg via INTRAVENOUS
  Filled 2015-05-30: qty 2

## 2015-05-30 MED ORDER — ONDANSETRON HCL 4 MG PO TABS: 4.0000 mg | ORAL_TABLET | Freq: Three times a day (TID) | ORAL | Status: DC | PRN

## 2015-05-30 NOTE — ED Notes (Signed)
Awake. Verbally responsive. Resp even and unlabored. No audible adventitious breath sounds noted. ABC's intact. Abd soft/nondistended but tender to palpate. BS (+) and active x4 quadrants. No N/V/D reported. IV SL patent and intact. Family at bedside.

## 2015-05-30 NOTE — ED Notes (Addendum)
Awake. Verbally responsive. Resp even and unlabored. No audible adventitious breath sounds noted. ABC's intact. Pt reported having rt flank pain radiating to RLQ. Reported having nausea without emesis. IV saline lock patent and intact. Family at bedside. Pt reported having bil kidney stents and kidney stones removal 04/2015.

## 2015-05-30 NOTE — Discharge Instructions (Signed)
Hydronephrosis Hydronephrosis is an abnormal enlargement of your kidney. It can affect one or both the kidneys. It results from the backward pressure of urine on the kidneys, when the flow of urine is blocked. Normally, the urine drains from the kidney through the urine tube (ureter), into a sac which holds the urine until urination (bladder). When the urinary flow is blocked, the urine collects above the block. This causes an increase in the pressure inside the kidney, which in turn leads to its enlargement. The block can occur at the point where the kidney joins the ureter. Treatment depends on the cause and location of the block.  CAUSES  The causes of this condition include:  Birth defect of the kidney or ureter.  Kink at the point where the kidney joins the ureter.  Stones and blood clots in the kidney or ureter.  Cancer, injury, or infection of the ureter.  Scar tissue formation.  Backflow of urine (reflux).  Cancer of bladder or prostate gland.  Abnormality of the nerves or muscles of the kidney or ureter.  Lower part of the ureter protruding into the bladder (ureterocele).  Abnormal contractions of the bladder.  Both the kidneys can be affected during pregnancy. This is because the enlarging uterus presses on the ureters and blocks the flow of urine. SYMPTOMS  The symptoms depend on the location of the block. They also depend on how long the block has been present. You may feel pain on the affected side. Sometimes, you may not have any symptoms. There may be a dull ache or discomfort in the flank. The common symptoms are:  Flank pain.  Swelling of the abdomen.  Pain in the abdomen.  Nausea and vomiting.  Fever.  Pain while passing urine.  Urgency for urination.  Frequent or urgent urination.  Infection of the urinary tract. DIAGNOSIS  Your caregiver will examine you after asking about your symptoms. You may be asked to do blood and urine tests. Your caregiver  may order a special X-ray, ultrasound, or CT scan. Sometimes a rigid or flexible telescope (cystoscope) is used to view the site of the blockage.  TREATMENT  Treatment depends on the site, cause, and duration of the block. The goal of treatment is to remove the blockage. Your caregiver will plan the treatment based on your condition. The different types of treatment are:   Putting in a soft plastic tube (ureteral stent) to connect the bladder with the kidney. This will help in draining the urine.  Putting in a soft tube (nephrostomy tube). This is placed through skin into the kidney. The trapped urine is drained out through the back. A plastic bag is attached to your skin to hold the urine that has drained out.  Antibiotics to treat or prevent infection.  Breaking down of the stone (lithotripsy). HOME CARE INSTRUCTIONS   It may take some time for the hydronephrosis to go away (resolve). Drink fluids as directed by your caregiver , and get a lot of rest.  If you have a drain in, your caregiver will give you directions about how to care for it. Be sure you understand these directions completely before you go home.  Take any antibiotics, pain medications, or other prescriptions exactly as prescribed.  Follow-up with your caregivers as directed. SEEK MEDICAL CARE IF:   You continue to have flank pain, nausea, or difficulty with urination.  You have any problem with any type of drainage device.  Your urine becomes cloudy or bloody. SEEK  IMMEDIATE MEDICAL CARE IF:   You have severe flank and/or abdominal pain.  You develop vomiting and are unable to hold down fluids.  You develop a fever above 100.5 F (38.1 C), or as per your caregiver. MAKE SURE YOU:   Understand these instructions.  Will watch your condition.  Will get help right away if you are not doing well or get worse. Document Released: 10/01/2007 Document Revised: 02/26/2012 Document Reviewed: 11/17/2010 [facility]  Patient Information 2015 [place], [place]. This information is not intended to replace advice given to you by your health care provider. Make sure you discuss any questions you have with your health care provider.

## 2015-05-30 NOTE — ED Notes (Addendum)
Per EMS, pt with Hx of renal calculi and renal stents c/o n/v, abdominal pain originating at umbilicus and radiating to right flank. Pt states this feels like renal calculi. EMS administered 4 mg zofran and 30 mg toradol IV en route.

## 2015-05-30 NOTE — ED Notes (Addendum)
Awake. Verbally responsive. Resp even and unlabored. No audible adventitious breath sounds noted. ABC's intact. Abd soft/nondistended but tender to palpate. BS (+) and active x4 quadrants. No N/V/D reported. IV SL patent and intact. Family at bedside.

## 2015-05-30 NOTE — ED Notes (Signed)
Awake. Verbally responsive. Resp even and unlabored. No audible adventitious breath sounds noted. ABC's intact. No N/V/D reported. IV SL patent and intact. Family at bedside.

## 2015-05-30 NOTE — ED Notes (Addendum)
Awake. Verbally responsive. Resp even and unlabored. No audible adventitious breath sounds noted. ABC's intact. IV saline lock patent and intact. Family at bedside.

## 2015-05-30 NOTE — ED Provider Notes (Signed)
CSN: [id number]     Arrival date & time 05/30/15  1046 History   First MD Initiated Contact with Patient 05/30/15 1211     Chief Complaint  Patient presents with  . Abdominal Pain  . Flank Pain  . Emesis     (Consider location/radiation/quality/duration/timing/severity/associated sxs/prior Treatment) Patient is a 44 y.o. female presenting with abdominal pain, flank pain, and vomiting.  Abdominal Pain Pain location:  R flank Pain quality: aching and pressure   Pain radiates to:  Groin Pain severity:  Severe Onset quality:  Gradual Duration:  4 hours Timing:  Constant Progression:  Worsening Chronicity:  Recurrent Context comment:  Hx of kidney stones.  had ureteral stent removed recently. Relieved by:  Nothing Worsened by:  Nothing tried Ineffective treatments:  None tried Associated symptoms: nausea and vomiting   Associated symptoms: no diarrhea, no fever and no hematuria   Flank Pain Associated symptoms include abdominal pain.  Emesis Associated symptoms: abdominal pain   Associated symptoms: no diarrhea     Past Medical History  Diagnosis Date  . Kidney stone   . UTI (urinary tract infection)   . Obesity   . Brain tumor 2014    tx with radiation  . Radiation     Brain tumor  . Hypertension   . Pulmonary emboli 2011  . Parathyroid abnormality   . Vitamin D deficiency   . Complication of anesthesia 1999    lung collapse after surgery for gallbladder  . Asthma     seasonal  . Headache     migraines  . Depression   . Anxiety    Past Surgical History  Procedure Laterality Date  . Kidney stone removal    . Lithotripsy    . Cystoscopy w/ ureteroscopy    . Cystoscopy with stent placement Left 01/12/2014    Procedure: CYSTOSCOPY WITH STENT PLACEMENT left retrograde;  Surgeon: Irine Seal, MD;  Location: [facility];  Service: Urology;  Laterality: Left;  . Surgery for,ectopic pregnancy  2011     1 fallopian tube rupture  . Tubal ligation  2011  . Cholecystectomy   1999  . Cystoscopy with retrograde pyelogram, ureteroscopy and stent placement Left 01/20/2014    Procedure: LEFT URETEROSCOPY WITH STENT PLACEMENT;  Surgeon: Irine Seal, MD;  Location: [facility];  Service: Urology;  Laterality: Left;  . Holmium laser application Left 02/21/6282    Procedure: HOLMIUM LASER APPLICATION;  Surgeon: Irine Seal, MD;  Location: [facility];  Service: Urology;  Laterality: Left;  . Cystoscopy with retrograde pyelogram, ureteroscopy and stent placement Bilateral 04/23/2015    Procedure: CYSTOSCOPY WITH BILATERAL RETROGRADE PYELOGRAM, URETEROSCOPY AND STENT PLACEMENT;  Surgeon: Alexis Frock, MD;  Location: [facility];  Service: Urology;  Laterality: Bilateral;  . Holmium laser application Bilateral 12/23/1759    Procedure: HOLMIUM LASER APPLICATION;  Surgeon: Alexis Frock, MD;  Location: [facility];  Service: Urology;  Laterality: Bilateral;   Family History  Problem Relation Age of Onset  . Bell's palsy Mother   . Heart failure Mother   . Asthma Father   . Hypertension Father    History  Substance Use Topics  . Smoking status: Never Smoker   . Smokeless tobacco: Never Used  . Alcohol Use: Yes     Comment: social only   OB History    No data available     Review of Systems  Constitutional: Negative for fever.  Gastrointestinal: Positive for nausea, vomiting and abdominal pain. Negative for diarrhea.  Genitourinary: Positive  for flank pain. Negative for hematuria.  All other systems reviewed and are negative.     Allergies  Bee venom; Eggs or egg-derived products; Other; Penicillins; Latex; Oxycodone-acetaminophen; and Penicillin g  Home Medications   Prior to Admission medications   Medication Sig Start Date End Date Taking? Authorizing Provider  bisacodyl (DULCOLAX) 5 MG EC tablet Take 5 mg by mouth daily as needed for moderate constipation.   Yes Historical Provider, MD  busPIRone (BUSPAR) 5 MG tablet Take 5 mg by mouth 3 (three) times daily.    Yes Historical  Provider, MD  HYDROcodone-acetaminophen (NORCO) 10-325 MG per tablet Take by mouth every 6 (six) hours as needed for severe pain (migraine).   Yes Historical Provider, MD  lisinopril-hydrochlorothiazide (PRINZIDE,ZESTORETIC) 20-12.5 MG per tablet Take 1 tablet by mouth every evening.    Yes Historical Provider, MD  ondansetron (ZOFRAN) 4 MG tablet Take 1 tablet (4 mg total) by mouth every 6 (six) hours. 05/05/15  Yes Jeffrey Hedges, PA-C  senna-docusate (SENOKOT-S) 8.6-50 MG per tablet Take 1 tablet by mouth 2 (two) times daily. While taking pain meds to prevent constipation 04/23/15  Yes Alexis Frock, MD  Vitamin D, Ergocalciferol, (DRISDOL) 50000 UNITS CAPS capsule Take 1 capsule (50,000 Units total) by mouth every 7 (seven) days. Patient taking differently: Take 50,000 Units by mouth every Wednesday.  03/09/15  Yes Philemon Kingdom, MD  albuterol (PROVENTIL HFA;VENTOLIN HFA) 108 (90 BASE) MCG/ACT inhaler Inhale 2 puffs into the lungs every 6 (six) hours as needed for wheezing or shortness of breath.     Historical Provider, MD  ciprofloxacin (CIPRO) 500 MG tablet Take 1 tablet (500 mg total) by mouth every 12 (twelve) hours. Patient not taking: Reported on 05/30/2015 05/05/15   Okey Regal, PA-C  HYDROmorphone (DILAUDID) 2 MG tablet Take 1 tablet (2 mg total) by mouth every 6 (six) hours as needed for moderate pain or severe pain. Postoperatively Patient not taking: Reported on 05/05/2015 04/23/15   Alexis Frock, MD  oxybutynin (DITROPAN) 5 MG tablet Take 1 tablet (5 mg total) by mouth every 8 (eight) hours as needed for bladder spasms. / stent discomfort Patient not taking: Reported on 05/30/2015 04/23/15   Alexis Frock, MD  oxyCODONE-acetaminophen (PERCOCET/ROXICET) 5-325 MG per tablet Take 1 tablet by mouth every 6 (six) hours as needed for severe pain. Patient not taking: Reported on 05/30/2015 05/05/15   Okey Regal, PA-C  sulfamethoxazole-trimethoprim (BACTRIM DS,SEPTRA DS) 800-160 MG per  tablet Take 1 tablet by mouth 2 (two) times daily. X 3 days, begin day prior to next Urology appointment. Patient not taking: Reported on 05/30/2015 04/23/15   Alexis Frock, MD   BP 153/96 mmHg  Pulse 87  Temp(Src) 97.7 F (36.5 C) (Oral)  Resp 18  SpO2 99%  LMP 04/22/2015 Physical Exam  Constitutional: She is oriented to person, place, and time. She appears well-developed and well-nourished. No distress.  obese  HENT:  Head: Normocephalic and atraumatic.  Mouth/Throat: Oropharynx is clear and moist.  Eyes: Conjunctivae are normal. Pupils are equal, round, and reactive to light. No scleral icterus.  Neck: Neck supple.  Cardiovascular: Normal rate, regular rhythm, normal heart sounds and intact distal pulses.   No murmur heard. Pulmonary/Chest: Effort normal and breath sounds normal. No stridor. No respiratory distress. She has no rales.  Abdominal: Soft. Bowel sounds are normal. She exhibits no distension. There is tenderness in the right upper quadrant. There is CVA tenderness (right flank). There is no rigidity, no rebound and no  guarding.  Musculoskeletal: Normal range of motion.  Neurological: She is alert and oriented to person, place, and time.  Skin: Skin is warm and dry. No rash noted.  Psychiatric: She has a normal mood and affect. Her behavior is normal.  Nursing note and vitals reviewed.   ED Course  Procedures (including critical care time) Labs Review Labs Reviewed  CBC WITH DIFFERENTIAL/PLATELET - Abnormal; Notable for the following:    Monocytes Relative 14 (*)    Monocytes Absolute 1.3 (*)    All other components within normal limits  COMPREHENSIVE METABOLIC PANEL - Abnormal; Notable for the following:    Glucose, Bld 120 (*)    All other components within normal limits  LIPASE, BLOOD - Abnormal; Notable for the following:    Lipase 17 (*)    All other components within normal limits  URINALYSIS, ROUTINE W REFLEX MICROSCOPIC (NOT AT [facility]) - Abnormal; Notable  for the following:    APPearance TURBID (*)    All other components within normal limits  URINE MICROSCOPIC-ADD ON - Abnormal; Notable for the following:    Bacteria, UA MANY (*)    All other components within normal limits  POC URINE PREG, ED    Imaging Review US Renal  05/30/2015   CLINICAL DATA:  44 year old female with right flank pain  EXAM: RENAL / URINARY TRACT ULTRASOUND COMPLETE  COMPARISON:  Prior CT abdomen/ pelvis 04/08/2015 ; prior renal ultrasound 03/07/2015  FINDINGS: Right Kidney:  Length: 15.8 cm. 13 mm echogenic stone within the lower pole collecting system. Mild right-sided hydronephrosis. Comparing across modalities to the prior CT scan from 04/08/2015 there has been no significant interval increase in the degree of hydronephrosis.  Left Kidney:  Length: 13.2 cm. Echogenicity within normal limits. No mass or hydronephrosis visualized. Extrarenal pelvis noted incidentally.  Bladder:  Appears normal for degree of bladder distention.  IMPRESSION: 1. Mild to moderate right-sided hydronephrosis which appears unchanged comparing across modalities to the prior CT scan dated 04/08/2015. This may represent represent chronic hydronephrosis, or recurrent hydronephrosis in the setting of recurrent obstructing ureteral stone. 2. A 1.3 cm stone is noted in the right lower pole collecting system. 3. No definite left hydronephrosis.  Mildly extrarenal pelvis.   Electronically Signed   By: Jacqulynn Cadet M.D.   On: 05/30/2015 14:44     EKG Interpretation None      MDM   Final diagnoses:  Hydronephrosis, right  Renal colic    History of kidney stones, and she thinks pain is similar to prior episodes of ureteral colic.  Labs reassuring and no blood in UA.  She has had hydronephrosis in past.  Will check renal US.  IV morphine and zofran for symptom control.    Right flank pain is likely secondary to right hydronephrosis.  Discussed case with Dr. Junious Silk (Urology) with plan for  continued pain control and close Urology follow up.  Pt comfortable with this plan.    Serita Grit, MD 05/30/15 [phone]

## 2015-08-16 ENCOUNTER — Ambulatory Visit: Payer: Medicaid Other | Admitting: Internal Medicine

## 2015-08-19 ENCOUNTER — Other Ambulatory Visit: Payer: Self-pay | Admitting: Nurse Practitioner

## 2015-08-19 DIAGNOSIS — Z1231 Encounter for screening mammogram for malignant neoplasm of breast: Secondary | ICD-10-CM

## 2015-08-25 ENCOUNTER — Emergency Department (HOSPITAL_COMMUNITY): Payer: Medicaid Other

## 2015-08-25 ENCOUNTER — Encounter (HOSPITAL_COMMUNITY): Payer: Self-pay | Admitting: *Deleted

## 2015-08-25 ENCOUNTER — Emergency Department (HOSPITAL_COMMUNITY)
Admission: EM | Admit: 2015-08-25 | Discharge: 2015-08-25 | Disposition: A | Discharge status: Home / Self Care | Payer: Medicaid Other | Attending: Emergency Medicine | Admitting: Emergency Medicine

## 2015-08-25 DIAGNOSIS — J45909 Unspecified asthma, uncomplicated: Secondary | ICD-10-CM | POA: Diagnosis not present

## 2015-08-25 DIAGNOSIS — N938 Other specified abnormal uterine and vaginal bleeding: Secondary | ICD-10-CM

## 2015-08-25 DIAGNOSIS — G43909 Migraine, unspecified, not intractable, without status migrainosus: Secondary | ICD-10-CM | POA: Diagnosis not present

## 2015-08-25 DIAGNOSIS — E669 Obesity, unspecified: Secondary | ICD-10-CM | POA: Diagnosis not present

## 2015-08-25 DIAGNOSIS — Z87442 Personal history of urinary calculi: Secondary | ICD-10-CM | POA: Insufficient documentation

## 2015-08-25 DIAGNOSIS — Z9889 Other specified postprocedural states: Secondary | ICD-10-CM | POA: Insufficient documentation

## 2015-08-25 DIAGNOSIS — Z91040 Latex allergy status: Secondary | ICD-10-CM | POA: Diagnosis not present

## 2015-08-25 DIAGNOSIS — F329 Major depressive disorder, single episode, unspecified: Secondary | ICD-10-CM | POA: Diagnosis not present

## 2015-08-25 DIAGNOSIS — Z86711 Personal history of pulmonary embolism: Secondary | ICD-10-CM | POA: Diagnosis not present

## 2015-08-25 DIAGNOSIS — Z79899 Other long term (current) drug therapy: Secondary | ICD-10-CM | POA: Insufficient documentation

## 2015-08-25 DIAGNOSIS — F419 Anxiety disorder, unspecified: Secondary | ICD-10-CM | POA: Insufficient documentation

## 2015-08-25 DIAGNOSIS — Z86011 Personal history of benign neoplasm of the brain: Secondary | ICD-10-CM | POA: Insufficient documentation

## 2015-08-25 DIAGNOSIS — E559 Vitamin D deficiency, unspecified: Secondary | ICD-10-CM | POA: Diagnosis not present

## 2015-08-25 DIAGNOSIS — Z880 Allergy status to penicillin: Secondary | ICD-10-CM | POA: Diagnosis not present

## 2015-08-25 DIAGNOSIS — Z87440 Personal history of urinary (tract) infections: Secondary | ICD-10-CM | POA: Insufficient documentation

## 2015-08-25 DIAGNOSIS — I10 Essential (primary) hypertension: Secondary | ICD-10-CM | POA: Insufficient documentation

## 2015-08-25 DIAGNOSIS — R52 Pain, unspecified: Secondary | ICD-10-CM

## 2015-08-25 LAB — CBC WITH DIFFERENTIAL/PLATELET
BASOS ABS: 0.1 10*3/uL (ref 0.0–0.1)
BASOS PCT: 1 % (ref 0–1)
Eosinophils Absolute: 0.1 10*3/uL (ref 0.0–0.7)
Eosinophils Relative: 1 % (ref 0–5)
HEMATOCRIT: 40.9 % (ref 36.0–46.0)
HEMOGLOBIN: 12.8 g/dL (ref 12.0–15.0)
Lymphocytes Relative: 28 % (ref 12–46)
Lymphs Abs: 2.2 10*3/uL (ref 0.7–4.0)
MCH: 27.4 pg (ref 26.0–34.0)
MCHC: 31.3 g/dL (ref 30.0–36.0)
MCV: 87.4 fL (ref 78.0–100.0)
Monocytes Absolute: 0.8 10*3/uL (ref 0.1–1.0)
Monocytes Relative: 11 % (ref 3–12)
NEUTROS ABS: 4.6 10*3/uL (ref 1.7–7.7)
NEUTROS PCT: 59 % (ref 43–77)
Platelets: 350 10*3/uL (ref 150–400)
RBC: 4.68 MIL/uL (ref 3.87–5.11)
RDW: 14.5 % (ref 11.5–15.5)
WBC: 7.8 10*3/uL (ref 4.0–10.5)

## 2015-08-25 MED ORDER — MEGESTROL ACETATE 40 MG PO TABS: 40.0000 mg | ORAL_TABLET | Freq: Three times a day (TID) | ORAL | Status: DC

## 2015-08-25 NOTE — ED Notes (Signed)
Ultrasound at bedside

## 2015-08-25 NOTE — ED Provider Notes (Signed)
CSN: [id number]     Arrival date & time 08/25/15  1651 History   First MD Initiated Contact with Patient 08/25/15 1820     Chief Complaint  Patient presents with  . Vaginal Bleeding     (Consider location/radiation/quality/duration/timing/severity/associated sxs/prior Treatment) HPI 44 year old female who presents with vaginal bleeding. History of brain tumor, HTN, anxiety, depression.Since May, she has been having abnormal vaginal bleeding. Since end of June to now, she has had persistent vaginal bleeding. Bleeding initially requiring a pad every 3-4 hours, but this week has noticed more profuse bleeding, a pad 1-2 hours and passing clots. She has gynecology follow-up set for the end of October. Has been feeling more lightheaded and weak while this is going on. Has had loose stools over past day, but denies vomiting, nausea, urinary problems. Associated with low abdominal discomfort.   Past Medical History  Diagnosis Date  . Kidney stone   . UTI (urinary tract infection)   . Obesity   . Brain tumor 2014    tx with radiation  . Radiation     Brain tumor  . Hypertension   . Pulmonary emboli 2011  . Parathyroid abnormality   . Vitamin D deficiency   . Complication of anesthesia 1999    lung collapse after surgery for gallbladder  . Asthma     seasonal  . Headache     migraines  . Depression   . Anxiety    Past Surgical History  Procedure Laterality Date  . Kidney stone removal    . Lithotripsy    . Cystoscopy w/ ureteroscopy    . Cystoscopy with stent placement Left 01/12/2014    Procedure: CYSTOSCOPY WITH STENT PLACEMENT left retrograde;  Surgeon: Irine Seal, MD;  Location: [facility];  Service: Urology;  Laterality: Left;  . Surgery for,ectopic pregnancy  2011     1 fallopian tube rupture  . Tubal ligation  2011  . Cholecystectomy  1999  . Cystoscopy with retrograde pyelogram, ureteroscopy and stent placement Left 01/20/2014    Procedure: LEFT URETEROSCOPY WITH STENT  PLACEMENT;  Surgeon: Irine Seal, MD;  Location: [facility];  Service: Urology;  Laterality: Left;  . Holmium laser application Left 08/24/2951    Procedure: HOLMIUM LASER APPLICATION;  Surgeon: Irine Seal, MD;  Location: [facility];  Service: Urology;  Laterality: Left;  . Cystoscopy with retrograde pyelogram, ureteroscopy and stent placement Bilateral 04/23/2015    Procedure: CYSTOSCOPY WITH BILATERAL RETROGRADE PYELOGRAM, URETEROSCOPY AND STENT PLACEMENT;  Surgeon: Alexis Frock, MD;  Location: [facility];  Service: Urology;  Laterality: Bilateral;  . Holmium laser application Bilateral 07/22/1323    Procedure: HOLMIUM LASER APPLICATION;  Surgeon: Alexis Frock, MD;  Location: [facility];  Service: Urology;  Laterality: Bilateral;   Family History  Problem Relation Age of Onset  . Bell's palsy Mother   . Heart failure Mother   . Asthma Father   . Hypertension Father    Social History  Substance Use Topics  . Smoking status: Never Smoker   . Smokeless tobacco: Never Used  . Alcohol Use: Yes     Comment: social only   OB History    No data available     Review of Systems  10/14 systems reviewed and are negative other than those stated in the HPI   Allergies  Bee venom; Eggs or egg-derived products; Other; Penicillins; Latex; Oxycodone-acetaminophen; and Penicillin g  Home Medications   Prior to Admission medications   Medication Sig Start Date End  Date Taking? Authorizing Provider  busPIRone (BUSPAR) 7.5 MG tablet TK 1 T PO  BID 08/05/15  Yes Historical Provider, MD  HYDROcodone-acetaminophen ([place]) 10-325 MG per tablet Take by mouth every 6 (six) hours as needed for severe pain (migraine).   Yes Historical Provider, MD  lisinopril-hydrochlorothiazide (PRINZIDE,ZESTORETIC) 20-12.5 MG per tablet Take 1 tablet by mouth every evening.    Yes Historical Provider, MD  LORazepam (ATIVAN) 1 MG tablet Take 1 mg by mouth every 6 (six) hours as needed for anxiety.  08/06/15  Yes Historical Provider, MD   naproxen sodium (ANAPROX) 220 MG tablet Take 440 mg by mouth 2 (two) times daily as needed (headache).   Yes Historical Provider, MD  ciprofloxacin (CIPRO) 500 MG tablet Take 1 tablet (500 mg total) by mouth every 12 (twelve) hours. Patient not taking: Reported on 05/30/2015 05/05/15   Okey Regal, PA-C  HYDROmorphone (DILAUDID) 2 MG tablet Take 1 tablet (2 mg total) by mouth every 6 (six) hours as needed for moderate pain or severe pain. Postoperatively Patient not taking: Reported on 05/05/2015 04/23/15   Alexis Frock, MD  megestrol (MEGACE) 40 MG tablet Take 1 tablet (40 mg total) by mouth 3 (three) times daily. Take 1 tablet by mouth 3 times daily until your bleeding stops. Then, take 1 tablet by mouth once daily until you follow-up with OB/GYN. 08/25/15   Forde Dandy, MD  ondansetron (ZOFRAN) 4 MG tablet Take 1 tablet (4 mg total) by mouth every 8 (eight) hours as needed for nausea or vomiting. Patient not taking: Reported on 08/25/2015 05/30/15   Serita Grit, MD  oxybutynin (DITROPAN) 5 MG tablet Take 1 tablet (5 mg total) by mouth every 8 (eight) hours as needed for bladder spasms. / stent discomfort Patient not taking: Reported on 05/30/2015 04/23/15   Alexis Frock, MD  oxyCODONE-acetaminophen (PERCOCET/ROXICET) 5-325 MG per tablet Take 1-2 tablets by mouth every 6 (six) hours as needed for severe pain. Patient not taking: Reported on 08/25/2015 05/30/15   Serita Grit, MD  senna-docusate (SENOKOT-S) 8.6-50 MG per tablet Take 1 tablet by mouth 2 (two) times daily. While taking pain meds to prevent constipation Patient not taking: Reported on 08/25/2015 04/23/15   Alexis Frock, MD  sulfamethoxazole-trimethoprim (BACTRIM DS,SEPTRA DS) 800-160 MG per tablet Take 1 tablet by mouth 2 (two) times daily. X 3 days, begin day prior to next Urology appointment. Patient not taking: Reported on 05/30/2015 04/23/15   Alexis Frock, MD  Vitamin D, Ergocalciferol, (DRISDOL) 50000 UNITS CAPS capsule Take 1 capsule  (50,000 Units total) by mouth every 7 (seven) days. Patient not taking: Reported on 08/25/2015 03/09/15   Philemon Kingdom, MD   BP 139/97 mmHg  Pulse 93  Temp(Src) 98.7 F (37.1 C) (Oral)  Resp 20  SpO2 98% Physical Exam Physical Exam  Nursing note and vitals reviewed. Constitutional: Well developed, well nourished, non-toxic, and in no acute distress Head: Normocephalic and atraumatic.  Mouth/Throat: Oropharynx is clear and moist.  Neck: Normal range of motion. Neck supple.  Cardiovascular: Normal rate and regular rhythm.   Pulmonary/Chest: Effort normal and breath sounds normal.  Abdominal: Soft. There is some suprapubic tenderness. There is no rebound and no guarding.  Musculoskeletal: Normal range of motion.  Neurological: Alert, no facial droop, fluent speech, moves all extremities symmetrically Skin: Skin is warm and dry.  Psychiatric: Cooperative Pelvic: Normal external genitalia. Normal internal genitalia. No discharge. Small clot and blood within the vagina. No cervical motion tenderness. No adnexal masses or tenderness.  Minimal bleeding from the cervix.     ED Course  Procedures (including critical care time) Labs Review Labs Reviewed  CBC WITH DIFFERENTIAL/PLATELET  I-STAT BETA HCG BLOOD, ED ([facility], [facility], [facility] ONLY)    Imaging Review US Transvaginal Non-ob  08/25/2015   CLINICAL DATA:  Dysfunctional uterine bleeding. Lower abdominal pain since July. History of ectopic pregnancy with left tube removed. Pregnancy test is pending.  EXAM: TRANSABDOMINAL AND TRANSVAGINAL ULTRASOUND OF PELVIS  TECHNIQUE: Both transabdominal and transvaginal ultrasound examinations of the pelvis were performed. Transabdominal technique was performed for global imaging of the pelvis including uterus, ovaries, adnexal regions, and pelvic cul-de-sac. It was necessary to proceed with endovaginal exam following the transabdominal exam to visualize the uterus and ovaries.  COMPARISON:  CT abdomen and pelvis  04/08/2015  FINDINGS: Uterus  Measurements: 10.6 x 5 x 6.2 cm, anteverted. No fibroids or other mass visualized. Small nabothian cysts in the cervix.  Endometrium  Thickness: 11.9 mm.  No focal abnormality visualized.  Right ovary  Right ovary is not visualized. No adnexal masses are seen in the images obtained.  Left ovary  Left ovary is not visualized. No adnexal masses are seen in the images obtained.  Other findings  No free fluid.  Examination is technically limited due to patient's body habitus.  IMPRESSION: Normal ultrasound appearance of the uterus. Ovaries are not visualized but no adnexal masses are seen.   Electronically Signed   By: Lucienne Capers M.D.   On: 08/25/2015 21:51   US Pelvis Complete  08/25/2015   CLINICAL DATA:  Dysfunctional uterine bleeding. Lower abdominal pain since July. History of ectopic pregnancy with left tube removed. Pregnancy test is pending.  EXAM: TRANSABDOMINAL AND TRANSVAGINAL ULTRASOUND OF PELVIS  TECHNIQUE: Both transabdominal and transvaginal ultrasound examinations of the pelvis were performed. Transabdominal technique was performed for global imaging of the pelvis including uterus, ovaries, adnexal regions, and pelvic cul-de-sac. It was necessary to proceed with endovaginal exam following the transabdominal exam to visualize the uterus and ovaries.  COMPARISON:  CT abdomen and pelvis 04/08/2015  FINDINGS: Uterus  Measurements: 10.6 x 5 x 6.2 cm, anteverted. No fibroids or other mass visualized. Small nabothian cysts in the cervix.  Endometrium  Thickness: 11.9 mm.  No focal abnormality visualized.  Right ovary  Right ovary is not visualized. No adnexal masses are seen in the images obtained.  Left ovary  Left ovary is not visualized. No adnexal masses are seen in the images obtained.  Other findings  No free fluid.  Examination is technically limited due to patient's body habitus.  IMPRESSION: Normal ultrasound appearance of the uterus. Ovaries are not visualized but  no adnexal masses are seen.   Electronically Signed   By: Lucienne Capers M.D.   On: 08/25/2015 21:51   I have personally reviewed and evaluated these images and lab results as part of my medical decision-making.    MDM   Final diagnoses:  Dysfunctional uterine bleeding    In short, this is a 44 year old female who presents with vaginal bleeding. Is not anticoagulated. Has normal vital signs on arrival. Has a soft and nonsurgical abdomen, with some suprapubic tenderness to palpation. Pelvic exam reveals no significant active bleeding from the cervix. Hemoglobin is normal. Pelvic ultrasound reveals no acute intrapelvic processes to explain bleeding. She is diagnosed with dysfunctional uterine bleeding. Under discussion with OB consult, Dr. Glo Herring, she is started on Megace for control of vaginal bleeding until she follows up with OB in October.  Strict return follow-up instructions are reviewed. She expressed understanding of all discharge instructions and felt comfortable to plan of care.    Forde Dandy, MD 08/26/15 [phone]

## 2015-08-25 NOTE — ED Notes (Signed)
Pt reports she has been having vaginal bleeding since late July, went to see her PCP x 2 weeks and was referred to a GYN.  GYN is not able to see her until October.  Pt reports mild dizziness.  Had blood work done 2 weeks ago and they were WNL.

## 2015-08-25 NOTE — Discharge Instructions (Signed)
Return without fail for worsening symptoms, including worsening pain, vomiting and unable to keep down food/fluids, worsening bleeding (> 1 pad per hour), or any other symptoms concerning to you.  Abnormal Uterine Bleeding Abnormal uterine bleeding can affect women at various stages in life, including teenagers, women in their reproductive years, pregnant women, and women who have reached menopause. Several kinds of uterine bleeding are considered abnormal, including:  Bleeding or spotting between periods.   Bleeding after sexual intercourse.   Bleeding that is heavier or more than normal.   Periods that last longer than usual.  Bleeding after menopause.  Many cases of abnormal uterine bleeding are minor and simple to treat, while others are more serious. Any type of abnormal bleeding should be evaluated by your health care provider. Treatment will depend on the cause of the bleeding. HOME CARE INSTRUCTIONS Monitor your condition for any changes. The following actions may help to alleviate any discomfort you are experiencing:  Avoid the use of tampons and douches as directed by your health care provider.  Change your pads frequently. You should get regular pelvic exams and Pap tests. Keep all follow-up appointments for diagnostic tests as directed by your health care provider.  SEEK MEDICAL CARE IF:   Your bleeding lasts more than 1 week.   You feel dizzy at times.  SEEK IMMEDIATE MEDICAL CARE IF:   You pass out.   You are changing pads every 15 to 30 minutes.   You have abdominal pain.  You have a fever.   You become sweaty or weak.   You are passing large blood clots from the vagina.   You start to feel nauseous and vomit. MAKE SURE YOU:   Understand these instructions.  Will watch your condition.  Will get help right away if you are not doing well or get worse. Document Released: 12/04/2005 Document Revised: 12/09/2013 Document Reviewed:  07/03/2013 [facility] Patient Information 2015 [place], [place]. This information is not intended to replace advice given to you by your health care provider. Make sure you discuss any questions you have with your health care provider.

## 2015-09-08 ENCOUNTER — Ambulatory Visit: Payer: Medicaid Other | Admitting: Internal Medicine

## 2015-09-10 ENCOUNTER — Ambulatory Visit (INDEPENDENT_AMBULATORY_CARE_PROVIDER_SITE_OTHER): Payer: Medicaid Other | Admitting: Internal Medicine

## 2015-09-10 ENCOUNTER — Encounter: Payer: Self-pay | Admitting: Internal Medicine

## 2015-09-10 VITALS — BP 112/68 | HR 88 | Temp 98.6°F | Resp 12 | Wt 362.0 lb

## 2015-09-10 DIAGNOSIS — E213 Hyperparathyroidism, unspecified: Secondary | ICD-10-CM

## 2015-09-10 DIAGNOSIS — E559 Vitamin D deficiency, unspecified: Secondary | ICD-10-CM

## 2015-09-10 LAB — VITAMIN D 25 HYDROXY (VIT D DEFICIENCY, FRACTURES): VITD: 11.7 ng/mL — AB (ref 30.00–100.00)

## 2015-09-10 MED ORDER — VITAMIN D (ERGOCALCIFEROL) 1.25 MG (50000 UNIT) PO CAPS: 50000.0000 [IU] | ORAL_CAPSULE | ORAL | Status: DC

## 2015-09-10 NOTE — Patient Instructions (Signed)
Please stop at the lab.  Please come back for a follow-up appointment in 6 months.  Patient information (Up-to-Date): Collection of a 24-hour urine specimen  - You should collect every drop of urine during each 24-hour period. It does not matter how much or little urine is passed each time, as long as every drop is collected. - Begin the urine collection in the morning after you wake up, after you have emptied your bladder for the first time. - Urinate (empty the bladder) for the first time and flush it down the toilet. Note the exact time (eg, 6:15 AM). You will begin the urine collection at this time. - Collect every drop of urine during the day and night in an empty collection bottle. Store the bottle at room temperature or in the refrigerator. - If you need to have a bowel movement, any urine passed with the bowel movement should be collected. Try not to include feces with the urine collection. If feces does get mixed in, do not try to remove the feces from the urine collection bottle. - Finish by collecting the first urine passed the next morning, adding it to the collection bottle. This should be within ten minutes before or after the time of the first morning void on the first day (which was flushed). In this example, you would try to void between 6:05 and 6:25 on the second day. - If you need to urinate one hour before the final collection time, drink a full glass of water so that you can void again at the appropriate time. If you have to urinate 20 minutes before, try to hold the urine until the proper time. - Please note the exact time of the final collection, even if it is not the same time as when collection began on day 1. - The bottle(s) may be kept at room temperature for a day or two, but should be kept cool or refrigerated for longer periods of time.    

## 2015-09-10 NOTE — Progress Notes (Signed)
Patient ID: Carol Ortega, female   DOB: [birth year], 44 y.o.   MRN: [id number]  HPI  Carol Ortega is a 44 y.o.-year-old female, initially referred by Dr Tresa Moore (her urologist), for evaluation for hypercalcemia, hyperparathyroidism, vitamin D def.. PCP: Dr Eldridge Abrahams. Last visit 6 mo ago.  She has been hospitalized for uterine bleeding >> started Megace >> bleeding stopped.   Reviewed hx: Pt was referred for hypercalcemia during investigation of nephrolithiasis. She tells me she was found to have a high calcium level in the past by PCP.   She has had L nephrolithiasis 2x a year since 2000 >> many visits to the ED. She has had lithotripsy and then extractions. Some of the stones she passed (12 mm). She has had a stent placed, now taken out.   I reviewed pt's pertinent labs: Lab Results  Component Value Date   PTH 165.2* 02/09/2014   CALCIUM 10.1 05/30/2015   CALCIUM 10.6* 05/05/2015   CALCIUM 10.8* 04/22/2015   CALCIUM 10.4 04/08/2015   CALCIUM 10.5 04/05/2015   CALCIUM 10.6* 03/07/2015   CALCIUM 10.6* 08/30/2014   CALCIUM 10.6* 05/27/2014   CALCIUM 10.4 04/27/2014   CALCIUM 10.4 02/09/2014   She has a h/o vitamin D deficiency and we had a lot of difficulty in replacing her vit D level so we can properly test her for primary hyperparathyroidism: 01/2014: vit D 14 >> started 2000 IU vit D OTC 06/2014: vit D 9.5 >> increased her vit D supplementation to 4000 units daily 12/2014: vit D 6.9 >> increased her vit D supplementation to 5000 units daily, then started ergocalciferol 50,000 units weekly x8 weeks >> then continued with 5000 IU daily. She is now on this dose.  No h/o osteoporosis or fractures.  No h/o CKD. Last BUN/Cr: Lab Results  Component Value Date   BUN 11 05/30/2015   CREATININE 0.89 05/30/2015   She also has a history of left sphenoid wing meningioma >> had 32 days of radiation tx. She also has a h/o HTN and was on Lisinopril-HCTZ, but not recently. She is on  Lisinopril only now.  I reviewed pt's medications, allergies, PMH, social hx, family hx, and changes were documented in the history of present illness. Otherwise, unchanged from my initial visit note.  ROS: Constitutional: + weight loss - intentional, no fatigue, + hot flushes Eyes: no blurry vision, no xerophthalmia ENT: no sore throat, no nodules palpated in throat, no dysphagia/odynophagia,+ hoarseness Cardiovascular: no CP/SOB/palpitations/leg swelling Respiratory: no cough/SOB Gastrointestinal: no N/V/C/no heartburn Musculoskeletal: no muscle/joint aches Skin: no rashes Neurological: no tremors/numbness/tingling/dizziness, no HA  PE: BP 112/68 mmHg  Pulse 88  Temp(Src) 98.6 F (37 C) (Oral)  Resp 12  Wt 362 lb (164.202 kg)  SpO2 97% Body mass index is 56.68 kg/(m^2). Wt Readings from Last 3 Encounters:  09/10/15 362 lb (164.202 kg)  04/23/15 378 lb (171.46 kg)  04/22/15 378 lb (171.46 kg)   Constitutional: obese class 3, in NAD. Eyes: PERRLA, EOMI, no exophthalmos ENT: moist mucous membranes, no thyromegaly, no cervical lymphadenopathy Cardiovascular: RRR, No MRG Respiratory: CTA B Gastrointestinal: abdomen soft, NT, ND, BS+ Musculoskeletal: no deformities, strength intact in all 4 Skin: moist, warm, no rashes Neurological: no tremor with outstretched hands, DTR normal in all 4  Assessment: 1. Hypercalcemia/hyperparathyroidism  Records from [facility] (Dr Tresa Moore) - 01/29/2014: Patient with history of: - left nephrolithiasis - underwent SWL 2015 [place], subsequently presenting with refractory colic, and bacteriuria in [place] and managed with a stent 01/13/2014,  followed by definitive ureteroscopy for large left UPJ stone - bacteriuria UTI - most recent positive culture with Escherichia coli 12/2013. Most recent urine culture on 01/25/2014, negative. - Investigation for nephrolithiasis: Uric acid 5.4 (2.4-7) BMP normal, except glucose 102 and calcium 10.7  (8.4-10.5); her GFR was normal at >89. PTH 190.8 (14-72)  Component     Latest Ref Rng 02/09/2014  Vitamin D 1, 25 (OH) Total     18 - 72 pg/mL 129 (H)  Vitamin D3 1, 25 (OH)      100  Vitamin D2 1, 25 (OH)      29  PTH     14.0 - 72.0 pg/mL 165.2 (H)  Calcium     8.4 - 10.5 mg/dL 10.4  Vit D, 25-Hydroxy     30 - 89 ng/mL 14 (L)  Phosphorus     2.3 - 4.6 mg/dL 2.9   01/2014:   - vit D 14 (some time after a course or Ergocalciferol) 06/2014: - Magnesium 2.0 - vitamin D 9.5, prev. 12, 4 mo ago - TSH 2.54 - BMP: normal, except:  Aphos 131 (32-98), prev. 96, 4 mo ago AST 14 (15-41) Ca 11.2  12/24/2014 Labs from [facility]:  - calcium 11.3 (8.4-10.5) - GFR 105 - iPTH 32 ([facility])  - vitamin D 25 HO 6.9 (30-100) - lower than before!  Vitamin D lower than before. iPTH not high, but higher than expected for the 11.3 calcium. 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  2. Vit D def - see above  Plan: 1. And 2. Patient has a h/o elevated calcium, with the highest level being at 11.1. PTH levels also high. Vit D very low >> we tried to replete without using high dose ergocalciferol however the last value that she had was very low >> started 50,000 units weekly Ergocalciferol x 8 weeks, then 5000 units of regular vitamin D daily; we have to be careful with vitamin D replacement 2/2 her h/o nephrolithiasis. We'll check her vitamin D at this visit and hopefully this is normalized. When vitamin D normalizes, will need to check a 24 h urinary calcium/creatinine ratio - we again discussed about how to do the collection correctly. - No osteoporosis, no fractures. No abdominal pain, depression, bone pain, but she has had a lot of problems with kidney stones in the past and also developed hydronephrosis. We have discussed about other measures to prevent  calcium stones including drinking plenty of water and trying to make her urine as alkaline is possible. I suggested that she eats a lot of fruit and vegetables.  - It is very likely that she does have a parathyroid adenoma based on the high PTH level with a borderline/high calcium, however, we need to recheck the tests when vit D normal. I can refer her to surgery when her vitamin D normalizes, but we discussed that her vitamin D needs to be close to normal at the time of the surgery, otherwise, she would be at risk for hypocalcemia postop. - we again discussed with the patient about the physiology of calcium and parathyroid hormone - criteria for parathyroid surgery are:  Increased calcium by more than 1 mg/dL above the upper limit of normal  Kidney ds.  Osteoporosis  Age <38 years old - she does meet the criteria based on age. Return in about  6 months (around 03/09/2016).   Office Visit on 09/10/2015  Component Date Value Ref Range Status  . VITD 09/10/2015 11.70* 30.00 - 100.00 ng/mL Final   Vitamin D a little better, but still very low. Hold off other testing for now, start ergocalciferol weekly and remain on it chronically. We'll check another vitamin D level in 2 months.

## 2015-10-11 ENCOUNTER — Emergency Department (HOSPITAL_COMMUNITY)
Admission: EM | Admit: 2015-10-11 | Discharge: 2015-10-11 | Disposition: A | Discharge status: Home / Self Care | Payer: Medicaid Other | Attending: Emergency Medicine | Admitting: Emergency Medicine

## 2015-10-11 ENCOUNTER — Encounter (HOSPITAL_COMMUNITY): Payer: Self-pay | Admitting: Emergency Medicine

## 2015-10-11 ENCOUNTER — Emergency Department (HOSPITAL_COMMUNITY): Payer: Medicaid Other

## 2015-10-11 DIAGNOSIS — Z91040 Latex allergy status: Secondary | ICD-10-CM | POA: Diagnosis not present

## 2015-10-11 DIAGNOSIS — R109 Unspecified abdominal pain: Secondary | ICD-10-CM | POA: Diagnosis present

## 2015-10-11 DIAGNOSIS — Z87440 Personal history of urinary (tract) infections: Secondary | ICD-10-CM | POA: Insufficient documentation

## 2015-10-11 DIAGNOSIS — E669 Obesity, unspecified: Secondary | ICD-10-CM | POA: Diagnosis not present

## 2015-10-11 DIAGNOSIS — Z86011 Personal history of benign neoplasm of the brain: Secondary | ICD-10-CM | POA: Diagnosis not present

## 2015-10-11 DIAGNOSIS — E559 Vitamin D deficiency, unspecified: Secondary | ICD-10-CM | POA: Diagnosis not present

## 2015-10-11 DIAGNOSIS — F419 Anxiety disorder, unspecified: Secondary | ICD-10-CM | POA: Insufficient documentation

## 2015-10-11 DIAGNOSIS — Z880 Allergy status to penicillin: Secondary | ICD-10-CM | POA: Diagnosis not present

## 2015-10-11 DIAGNOSIS — I10 Essential (primary) hypertension: Secondary | ICD-10-CM | POA: Insufficient documentation

## 2015-10-11 DIAGNOSIS — Z86711 Personal history of pulmonary embolism: Secondary | ICD-10-CM | POA: Insufficient documentation

## 2015-10-11 DIAGNOSIS — N200 Calculus of kidney: Secondary | ICD-10-CM | POA: Diagnosis not present

## 2015-10-11 DIAGNOSIS — Z3202 Encounter for pregnancy test, result negative: Secondary | ICD-10-CM | POA: Insufficient documentation

## 2015-10-11 DIAGNOSIS — J45909 Unspecified asthma, uncomplicated: Secondary | ICD-10-CM | POA: Insufficient documentation

## 2015-10-11 DIAGNOSIS — Z79899 Other long term (current) drug therapy: Secondary | ICD-10-CM | POA: Insufficient documentation

## 2015-10-11 DIAGNOSIS — F329 Major depressive disorder, single episode, unspecified: Secondary | ICD-10-CM | POA: Diagnosis not present

## 2015-10-11 DIAGNOSIS — R52 Pain, unspecified: Secondary | ICD-10-CM

## 2015-10-11 LAB — BASIC METABOLIC PANEL
Anion gap: 8 (ref 5–15)
BUN: 9 mg/dL (ref 6–20)
CHLORIDE: 108 mmol/L (ref 101–111)
CO2: 23 mmol/L (ref 22–32)
Calcium: 10.8 mg/dL — ABNORMAL HIGH (ref 8.9–10.3)
Creatinine, Ser: 0.85 mg/dL (ref 0.44–1.00)
GFR calc Af Amer: >60 mL/min (ref >60)
GFR calc non Af Amer: >60 mL/min (ref >60)
Glucose, Bld: 112 mg/dL — ABNORMAL HIGH (ref 65–99)
Potassium: 3.8 mmol/L (ref 3.5–5.1)
Sodium: 139 mmol/L (ref 135–145)

## 2015-10-11 LAB — CBC
HEMATOCRIT: 42.1 % (ref 36.0–46.0)
HEMOGLOBIN: 13.7 g/dL (ref 12.0–15.0)
MCH: 27.9 pg (ref 26.0–34.0)
MCHC: 32.5 g/dL (ref 30.0–36.0)
MCV: 85.7 fL (ref 78.0–100.0)
Platelets: 362 10*3/uL (ref 150–400)
RBC: 4.91 MIL/uL (ref 3.87–5.11)
RDW: 14.7 % (ref 11.5–15.5)
WBC: 8.6 10*3/uL (ref 4.0–10.5)

## 2015-10-11 LAB — URINE MICROSCOPIC-ADD ON

## 2015-10-11 LAB — URINALYSIS, ROUTINE W REFLEX MICROSCOPIC
Bilirubin Urine: NEGATIVE
GLUCOSE, UA: NEGATIVE mg/dL
KETONES UR: NEGATIVE mg/dL
Nitrite: NEGATIVE
PROTEIN: NEGATIVE mg/dL
Specific Gravity, Urine: 1.014 (ref 1.005–1.030)
UROBILINOGEN UA: 1 mg/dL (ref 0.0–1.0)
pH: 7 (ref 5.0–8.0)

## 2015-10-11 LAB — I-STAT BETA HCG BLOOD, ED (MC, WL, AP ONLY)

## 2015-10-11 MED ORDER — ONDANSETRON HCL 4 MG/2ML IJ SOLN
4.0000 mg | Freq: Once | INTRAMUSCULAR | Status: AC
  Administered 2015-10-11: 4 mg via INTRAVENOUS
  Filled 2015-10-11: qty 2

## 2015-10-11 MED ORDER — FENTANYL CITRATE (PF) 100 MCG/2ML IJ SOLN
50.0000 ug | Freq: Once | INTRAMUSCULAR | Status: AC
  Administered 2015-10-11: 50 ug via INTRAVENOUS
  Filled 2015-10-11: qty 2

## 2015-10-11 MED ORDER — KETOROLAC TROMETHAMINE 30 MG/ML IJ SOLN
30.0000 mg | Freq: Once | INTRAMUSCULAR | Status: AC
  Administered 2015-10-11: 30 mg via INTRAVENOUS
  Filled 2015-10-11: qty 1

## 2015-10-11 MED ORDER — ONDANSETRON 4 MG PO TBDP: ORAL_TABLET | ORAL | Status: DC

## 2015-10-11 MED ORDER — HYDROMORPHONE HCL 4 MG PO TABS: 4.0000 mg | ORAL_TABLET | Freq: Four times a day (QID) | ORAL | Status: DC | PRN

## 2015-10-11 MED ORDER — MORPHINE SULFATE (PF) 4 MG/ML IV SOLN
6.0000 mg | Freq: Once | INTRAVENOUS | Status: AC
  Administered 2015-10-11: 6 mg via INTRAVENOUS
  Filled 2015-10-11: qty 2

## 2015-10-11 MED ORDER — HYDROMORPHONE HCL 1 MG/ML IJ SOLN
1.0000 mg | Freq: Once | INTRAMUSCULAR | Status: AC
  Administered 2015-10-11: 1 mg via INTRAVENOUS
  Filled 2015-10-11: qty 1

## 2015-10-11 NOTE — ED Provider Notes (Signed)
CSN: [id number]     Arrival date & time 10/11/15  1328 History   First MD Initiated Contact with Patient 10/11/15 1512     Chief Complaint  Patient presents with  . Flank Pain     (Consider location/radiation/quality/duration/timing/severity/associated sxs/prior Treatment) Patient is a 44 y.o. female presenting with flank pain. The history is provided by the patient (Patient complains of left flank pain. Patient has a history of kidney stones.).  Flank Pain This is a recurrent problem. The current episode started 1 to 2 hours ago. The problem occurs constantly. The problem has not changed since onset.Pertinent negatives include no chest pain, no abdominal pain, no headaches and no shortness of breath. Nothing aggravates the symptoms. Nothing relieves the symptoms.    Past Medical History  Diagnosis Date  . Kidney stone   . UTI (urinary tract infection)   . Obesity   . Brain tumor ([place]) 2014    tx with radiation  . Radiation     Brain tumor  . Hypertension   . Pulmonary emboli ([place]) 2011  . Parathyroid abnormality ([place])   . Vitamin D deficiency   . Complication of anesthesia 1999    lung collapse after surgery for gallbladder  . Asthma     seasonal  . Headache     migraines  . Depression   . Anxiety    Past Surgical History  Procedure Laterality Date  . Kidney stone removal    . Lithotripsy    . Cystoscopy w/ ureteroscopy    . Cystoscopy with stent placement Left 01/12/2014    Procedure: CYSTOSCOPY WITH STENT PLACEMENT left retrograde;  Surgeon: Irine Seal, MD;  [facility]: [facility];  Service: Urology;  Laterality: Left;  . Surgery for,ectopic pregnancy  2011     1 fallopian tube rupture  . Tubal ligation  2011  . Cholecystectomy  1999  . Cystoscopy with retrograde pyelogram, ureteroscopy and stent placement Left 01/20/2014    Procedure: LEFT URETEROSCOPY WITH STENT PLACEMENT;  Surgeon: Irine Seal, MD;  [facility];  Service: Urology;  Laterality: Left;  . Holmium  laser application Left 06/22/7208    Procedure: HOLMIUM LASER APPLICATION;  Surgeon: Irine Seal, MD;  [facility];  Service: Urology;  Laterality: Left;  . Cystoscopy with retrograde pyelogram, ureteroscopy and stent placement Bilateral 04/23/2015    Procedure: CYSTOSCOPY WITH BILATERAL RETROGRADE PYELOGRAM, URETEROSCOPY AND STENT PLACEMENT;  Surgeon: Alexis Frock, MD;  [facility];  Service: Urology;  Laterality: Bilateral;  . Holmium laser application Bilateral 03/24/961    Procedure: HOLMIUM LASER APPLICATION;  Surgeon: Alexis Frock, MD;  [facility];  Service: Urology;  Laterality: Bilateral;   Family History  Problem Relation Age of Onset  . Bell's palsy Mother   . Heart failure Mother   . Asthma Father   . Hypertension Father    Social History  Substance Use Topics  . Smoking status: Never Smoker   . Smokeless tobacco: Never Used  . Alcohol Use: Yes     Comment: social only   OB History    No data available     Review of Systems  Constitutional: Negative for appetite change and fatigue.  HENT: Negative for congestion, ear discharge and sinus pressure.   Eyes: Negative for discharge.  Respiratory: Negative for cough and shortness of breath.   Cardiovascular: Negative for chest pain.  Gastrointestinal: Negative for abdominal pain and diarrhea.  Genitourinary: Positive for flank pain. Negative for frequency and hematuria.  Musculoskeletal: Negative  for back pain.  Skin: Negative for rash.  Neurological: Negative for seizures and headaches.  Psychiatric/Behavioral: Negative for hallucinations.      Allergies  Bee venom; Eggs or egg-derived products; Other; Penicillins; Latex; Oxycodone-acetaminophen; and Penicillin g  Home Medications   Prior to Admission medications   Medication Sig Start Date End Date Taking? Authorizing Provider  busPIRone (BUSPAR) 7.5 MG tablet Take 7.5 mg by mouth 2 (two) times daily.   Yes Historical Provider, MD   HYDROcodone-acetaminophen (NORCO) 10-325 MG per tablet Take by mouth every 6 (six) hours as needed for severe pain (migraine).   Yes Historical Provider, MD  lisinopril-hydrochlorothiazide (PRINZIDE,ZESTORETIC) 20-12.5 MG per tablet Take 1 tablet by mouth every evening.    Yes Historical Provider, MD  LORazepam (ATIVAN) 1 MG tablet Take 1 mg by mouth every 6 (six) hours as needed for anxiety.  08/06/15  Yes Historical Provider, MD  megestrol (MEGACE) 40 MG tablet Take 1 tablet (40 mg total) by mouth 3 (three) times daily. Take 1 tablet by mouth 3 times daily until your bleeding stops. Then, take 1 tablet by mouth once daily until you follow-up with OB/GYN. Patient taking differently: Take 40 mg by mouth daily.  08/25/15  Yes Forde Dandy, MD  naproxen sodium (ANAPROX) 220 MG tablet Take 440 mg by mouth 2 (two) times daily as needed (headache).   Yes Historical Provider, MD  ondansetron (ZOFRAN) 4 MG tablet Take 1 tablet (4 mg total) by mouth every 8 (eight) hours as needed for nausea or vomiting. 05/30/15  Yes Serita Grit, MD  Vitamin D, Ergocalciferol, (DRISDOL) 50000 UNITS CAPS capsule Take 1 capsule (50,000 Units total) by mouth every 7 (seven) days. Patient taking differently: Take 50,000 Units by mouth every 7 (seven) days. Monday 09/10/15  Yes Philemon Kingdom, MD  ciprofloxacin (CIPRO) 500 MG tablet Take 1 tablet (500 mg total) by mouth every 12 (twelve) hours. Patient not taking: Reported on 09/10/2015 05/05/15   Okey Regal, PA-C  HYDROmorphone (DILAUDID) 4 MG tablet Take 1 tablet (4 mg total) by mouth every 6 (six) hours as needed for severe pain. 10/11/15   Milton Ferguson, MD  ondansetron (ZOFRAN ODT) 4 MG disintegrating tablet 4mg  ODT q4 hours prn nausea/vomit 10/11/15   Milton Ferguson, MD  oxybutynin (DITROPAN) 5 MG tablet Take 1 tablet (5 mg total) by mouth every 8 (eight) hours as needed for bladder spasms. / stent discomfort Patient not taking: Reported on 10/11/2015 04/23/15   Alexis Frock, MD  oxyCODONE-acetaminophen (PERCOCET/ROXICET) 5-325 MG per tablet Take 1-2 tablets by mouth every 6 (six) hours as needed for severe pain. Patient not taking: Reported on 10/11/2015 05/30/15   Serita Grit, MD  senna-docusate (SENOKOT-S) 8.6-50 MG per tablet Take 1 tablet by mouth 2 (two) times daily. While taking pain meds to prevent constipation Patient not taking: Reported on 10/11/2015 04/23/15   Alexis Frock, MD  sulfamethoxazole-trimethoprim (BACTRIM DS,SEPTRA DS) 800-160 MG per tablet Take 1 tablet by mouth 2 (two) times daily. X 3 days, begin day prior to next Urology appointment. Patient not taking: Reported on 10/11/2015 04/23/15   Alexis Frock, MD   BP 148/100 mmHg  Pulse 91  Temp(Src) 97.8 F (36.6 C) (Temporal)  Resp 21  SpO2 97%  LMP  (LMP Unknown) Physical Exam  Constitutional: She is oriented to person, place, and time. She appears well-developed.  HENT:  Head: Normocephalic.  Eyes: Conjunctivae and EOM are normal. No scleral icterus.  Neck: Neck supple. No thyromegaly present.  Cardiovascular: Normal rate and regular rhythm.  Exam reveals no gallop and no friction rub.   No murmur heard. Pulmonary/Chest: No stridor. She has no wheezes. She has no rales. She exhibits no tenderness.  Abdominal: She exhibits no distension. There is no tenderness. There is no rebound.  Genitourinary:  Tender left flank  Musculoskeletal: Normal range of motion. She exhibits no edema.  Lymphadenopathy:    She has no cervical adenopathy.  Neurological: She is oriented to person, place, and time. She exhibits normal muscle tone. Coordination normal.  Skin: No rash noted. No erythema.  Psychiatric: She has a normal mood and affect. Her behavior is normal.    ED Course  Procedures (including critical care time) Labs Review Labs Reviewed  URINALYSIS, [place] (NOT AT [facility]) - Abnormal; Notable for the following:    APPearance TURBID (*)    Hgb urine dipstick  LARGE (*)    Leukocytes, UA SMALL (*)    All other components within normal limits  BASIC METABOLIC PANEL - Abnormal; Notable for the following:    Glucose, Bld 112 (*)    Calcium 10.8 (*)    All other components within normal limits  URINE MICROSCOPIC-ADD ON - Abnormal; Notable for the following:    Squamous Epithelial / LPF FEW (*)    Bacteria, UA FEW (*)    All other components within normal limits  URINE CULTURE  CBC  I-STAT BETA HCG BLOOD, ED ([facility], [facility], [facility] ONLY)    Imaging Review Ct Renal Stone Study  10/11/2015  CLINICAL DATA:  Left flank pain starting 9 a.m. this morning EXAM: CT ABDOMEN AND PELVIS WITHOUT CONTRAST TECHNIQUE: Multidetector CT imaging of the abdomen and pelvis was performed following the standard protocol without IV contrast. COMPARISON:  04/08/2015 FINDINGS: Lung bases are unremarkable. There are streaky artifacts from patient's large body habitus. Unenhanced liver shows no biliary ductal dilatation. Status post cholecystectomy. Unenhanced pancreas, spleen and adrenal glands are unremarkable. At least 2 nonobstructive punctate calcified calculi are noted within right kidney the largest measures 4 mm in midpole. Nonobstructive calcified calculus in midpole of the left kidney measures 2.2 mm. There is mild left hydronephrosis and left hydroureter. In axial image 52 with there is 4.4 mm calcified obstructive calculus in proximal left ureter at the level of upper endplate of L5 vertebral body. Bilateral distal ureter is unremarkable. The patient is status post tubal ligation. The uterus is anteflexed. The urinary bladder is unremarkable. Bilateral distal ureter is unremarkable. Normal appendix noted in axial image 64. No pericecal inflammation. No small bowel obstruction. No ascites or free air. No adenopathy There is disc space flattening with vacuum disc phenomenon at L4-L5 level. IMPRESSION: 1. There is bilateral nonobstructive nephrolithiasis. 2. Mild left hydronephrosis and  proximal left hydroureter. 3. There is 4.4 mm calcified obstructive calculus in proximal left ureter at the level of upper endplate of L5 vertebral body. 4. Normal appendix.  No pericecal inflammation. 5. No calcified calculi are noted within urinary bladder. Electronically Signed   By: Lahoma Crocker M.D.   On: 10/11/2015 16:22   I have personally reviewed and evaluated these images and lab results as part of my medical decision-making.   EKG Interpretation None      MDM   Final diagnoses:  Kidney stone    Patient with a kidney stone, left flank area. We'll treat patient with Dilaudid and Zofran and her follow-up with her neurologist this week.    Milton Ferguson, MD 10/11/15  1807 

## 2015-10-11 NOTE — ED Notes (Signed)
Per pt, states left flank pain that started around 9 am-radiating to back

## 2015-10-11 NOTE — ED Notes (Signed)
Patient was alert, oriented and stable upon discharge. RN went over AVS and patient had no further questions.  

## 2015-10-11 NOTE — ED Notes (Signed)
Bed: WA05 Expected date:  Expected time:  Means of arrival:  Comments: Hold for triage 1 

## 2015-10-11 NOTE — Discharge Instructions (Signed)
Follow-up with your urologist this week. °

## 2015-10-11 NOTE — ED Notes (Signed)
Walked in to reassess patient's pain level-pt is crying in pain. MD Kohut aware. Morphine and Toradol IV ordered. To give to patient immediately.

## 2015-10-13 LAB — URINE CULTURE: SPECIAL REQUESTS: NORMAL

## 2015-10-23 ENCOUNTER — Emergency Department (HOSPITAL_COMMUNITY)
Admission: EM | Admit: 2015-10-23 | Discharge: 2015-10-23 | Disposition: A | Discharge status: Home / Self Care | Payer: Medicaid Other | Attending: Emergency Medicine | Admitting: Emergency Medicine

## 2015-10-23 ENCOUNTER — Encounter (HOSPITAL_COMMUNITY): Payer: Self-pay

## 2015-10-23 DIAGNOSIS — Z8659 Personal history of other mental and behavioral disorders: Secondary | ICD-10-CM | POA: Diagnosis not present

## 2015-10-23 DIAGNOSIS — Z9851 Tubal ligation status: Secondary | ICD-10-CM | POA: Insufficient documentation

## 2015-10-23 DIAGNOSIS — Z79899 Other long term (current) drug therapy: Secondary | ICD-10-CM | POA: Insufficient documentation

## 2015-10-23 DIAGNOSIS — Z9049 Acquired absence of other specified parts of digestive tract: Secondary | ICD-10-CM | POA: Diagnosis not present

## 2015-10-23 DIAGNOSIS — Z87440 Personal history of urinary (tract) infections: Secondary | ICD-10-CM | POA: Insufficient documentation

## 2015-10-23 DIAGNOSIS — Z87442 Personal history of urinary calculi: Secondary | ICD-10-CM | POA: Insufficient documentation

## 2015-10-23 DIAGNOSIS — N898 Other specified noninflammatory disorders of vagina: Secondary | ICD-10-CM | POA: Diagnosis present

## 2015-10-23 DIAGNOSIS — J45909 Unspecified asthma, uncomplicated: Secondary | ICD-10-CM | POA: Diagnosis not present

## 2015-10-23 DIAGNOSIS — Z8679 Personal history of other diseases of the circulatory system: Secondary | ICD-10-CM | POA: Insufficient documentation

## 2015-10-23 DIAGNOSIS — N939 Abnormal uterine and vaginal bleeding, unspecified: Secondary | ICD-10-CM

## 2015-10-23 DIAGNOSIS — Z3202 Encounter for pregnancy test, result negative: Secondary | ICD-10-CM | POA: Insufficient documentation

## 2015-10-23 DIAGNOSIS — E669 Obesity, unspecified: Secondary | ICD-10-CM | POA: Insufficient documentation

## 2015-10-23 DIAGNOSIS — Z86011 Personal history of benign neoplasm of the brain: Secondary | ICD-10-CM | POA: Diagnosis not present

## 2015-10-23 DIAGNOSIS — Z880 Allergy status to penicillin: Secondary | ICD-10-CM | POA: Diagnosis not present

## 2015-10-23 DIAGNOSIS — R1030 Lower abdominal pain, unspecified: Secondary | ICD-10-CM | POA: Diagnosis not present

## 2015-10-23 DIAGNOSIS — Z91040 Latex allergy status: Secondary | ICD-10-CM | POA: Insufficient documentation

## 2015-10-23 DIAGNOSIS — Z86711 Personal history of pulmonary embolism: Secondary | ICD-10-CM | POA: Diagnosis not present

## 2015-10-23 DIAGNOSIS — E559 Vitamin D deficiency, unspecified: Secondary | ICD-10-CM | POA: Insufficient documentation

## 2015-10-23 LAB — WET PREP, GENITAL
Clue Cells Wet Prep HPF POC: NONE SEEN
TRICH WET PREP: NONE SEEN
YEAST WET PREP: NONE SEEN

## 2015-10-23 LAB — URINALYSIS, ROUTINE W REFLEX MICROSCOPIC
GLUCOSE, UA: NEGATIVE mg/dL
Ketones, ur: NEGATIVE mg/dL
NITRITE: NEGATIVE
PH: 5.5 (ref 5.0–8.0)
Protein, ur: 30 mg/dL — AB
Specific Gravity, Urine: 1.03 (ref 1.005–1.030)
Urobilinogen, UA: 1 mg/dL (ref 0.0–1.0)

## 2015-10-23 LAB — HIV ANTIBODY (ROUTINE TESTING W REFLEX): HIV SCREEN 4TH GENERATION: NONREACTIVE

## 2015-10-23 LAB — URINE MICROSCOPIC-ADD ON

## 2015-10-23 LAB — RPR: RPR Ser Ql: NONREACTIVE

## 2015-10-23 LAB — POC URINE PREG, ED: Preg Test, Ur: NEGATIVE

## 2015-10-23 MED ORDER — IBUPROFEN 800 MG PO TABS: 800.0000 mg | ORAL_TABLET | Freq: Three times a day (TID) | ORAL | Status: DC

## 2015-10-23 MED ORDER — HYDROCODONE-ACETAMINOPHEN 5-325 MG PO TABS
2.0000 | ORAL_TABLET | Freq: Once | ORAL | Status: AC
  Administered 2015-10-23: 2 via ORAL
  Filled 2015-10-23: qty 2

## 2015-10-23 NOTE — ED Notes (Signed)
Pt report abnormal vaginal bleeding and expelling tissue and lower abd pressure. Pt denies fever, diarrhea, and vomiting but having emesis.

## 2015-10-23 NOTE — Discharge Instructions (Signed)
You have been seen today for vaginal bleeding and abdominal pain. Your lab tests showed no abnormalities. It is recommended that you follow up with your OBGYN for an ultrasound as soon as possible. Follow up with PCP as needed. Return to ED should symptoms worsen.

## 2015-10-23 NOTE — ED Provider Notes (Signed)
CSN: [id number]     Arrival date & time 10/23/15  0455 History   First MD Initiated Contact with Patient 10/23/15 0602     Chief Complaint  Patient presents with  . Vaginal Discharge     (Consider location/radiation/quality/duration/timing/severity/associated sxs/prior Treatment) HPI   Carol Ortega is a 44 y.o. female, pt with a history of kidney stones and anxiety, presenting with heavy vaginal bleeding for 5 months. Pt was seen by her OBGYN this past Thursday, Nov 3, who gave her Megace and "another hormone medication."  Bleeding is cyclical and occurs monthly. Pt has been going through about 3 pads or tampons every hour. Pt has had this current episode of bleeding for the past 3 weeks. Pt states the bleeding hasn't slowed since starting the hormone therapy. Pt states she took the Megace once before in September of this year and it stopped the bleeding the next day. Pt states this morning she began to have cramping/"heaviness" in her pelvis and lower abdomen bilaterally, "like I have to have a bowel movement," rated 10/10, non-radiating. Pt had a BM and states it was a little loose, but pretty much normal. BM did not relieve the pain. Pt denies fever/chills, N/V/C/D, hematochezia, vaginal pain, dysuria, or any other pain or complaints. Pt has history of cholecystectomy, oophorectomy on left, tubal ligation on right.   Past Medical History  Diagnosis Date  . Kidney stone   . UTI (urinary tract infection)   . Obesity   . Brain tumor ([place]) 2014    tx with radiation  . Radiation     Brain tumor  . Hypertension   . Pulmonary emboli ([place]) 2011  . Parathyroid abnormality ([place])   . Vitamin D deficiency   . Complication of anesthesia 1999    lung collapse after surgery for gallbladder  . Asthma     seasonal  . Headache     migraines  . Depression   . Anxiety    Past Surgical History  Procedure Laterality Date  . Kidney stone removal    . Lithotripsy    . Cystoscopy w/  ureteroscopy    . Cystoscopy with stent placement Left 01/12/2014    Procedure: CYSTOSCOPY WITH STENT PLACEMENT left retrograde;  Surgeon: Irine Seal, MD;  [facility];  Service: Urology;  Laterality: Left;  . Surgery for,ectopic pregnancy  2011     1 fallopian tube rupture  . Tubal ligation  2011  . Cholecystectomy  1999  . Cystoscopy with retrograde pyelogram, ureteroscopy and stent placement Left 01/20/2014    Procedure: LEFT URETEROSCOPY WITH STENT PLACEMENT;  Surgeon: Irine Seal, MD;  [facility];  Service: Urology;  Laterality: Left;  . Holmium laser application Left 05/21/4033    Procedure: HOLMIUM LASER APPLICATION;  Surgeon: Irine Seal, MD;  [facility];  Service: Urology;  Laterality: Left;  . Cystoscopy with retrograde pyelogram, ureteroscopy and stent placement Bilateral 04/23/2015    Procedure: CYSTOSCOPY WITH BILATERAL RETROGRADE PYELOGRAM, URETEROSCOPY AND STENT PLACEMENT;  Surgeon: Alexis Frock, MD;  [facility];  Service: Urology;  Laterality: Bilateral;  . Holmium laser application Bilateral 06/20/2594    Procedure: HOLMIUM LASER APPLICATION;  Surgeon: Alexis Frock, MD;  [facility];  Service: Urology;  Laterality: Bilateral;   Family History  Problem Relation Age of Onset  . Bell's palsy Mother   . Heart failure Mother   . Asthma Father   . Hypertension Father    Social History  Substance Use Topics  .  Smoking status: Never Smoker   . Smokeless tobacco: Never Used  . Alcohol Use: Yes     Comment: social only   OB History    No data available     Review of Systems  Constitutional: Negative for fever, chills, diaphoresis and unexpected weight change.  Respiratory: Negative for cough, chest tightness and shortness of breath.   Cardiovascular: Negative for chest pain, palpitations and leg swelling.  Gastrointestinal: Positive for abdominal pain. Negative for nausea, vomiting, diarrhea and constipation.  Genitourinary: Positive for pelvic pain.  Negative for dysuria and flank pain.  Musculoskeletal: Negative for back pain.  Skin: Negative for color change and pallor.  Neurological: Negative for dizziness, syncope, weakness and light-headedness.  All other systems reviewed and are negative.     Allergies  Bee venom; Eggs or egg-derived products; Other; Penicillins; Latex; Oxycodone-acetaminophen; and Penicillin g  Home Medications   Prior to Admission medications   Medication Sig Start Date End Date Taking? Authorizing Provider  lisinopril-hydrochlorothiazide (PRINZIDE,ZESTORETIC) 20-12.5 MG per tablet Take 1 tablet by mouth every evening.    Yes Historical Provider, MD  megestrol (MEGACE) 40 MG tablet Take 1 tablet (40 mg total) by mouth 3 (three) times daily. Take 1 tablet by mouth 3 times daily until your bleeding stops. Then, take 1 tablet by mouth once daily until you follow-up with OB/GYN. Patient taking differently: Take 40 mg by mouth daily.  08/25/15  Yes Forde Dandy, MD  Vitamin D, Ergocalciferol, (DRISDOL) 50000 UNITS CAPS capsule Take 1 capsule (50,000 Units total) by mouth every 7 (seven) days. Patient taking differently: Take 50,000 Units by mouth every 7 (seven) days. Monday 09/10/15  Yes Philemon Kingdom, MD  ciprofloxacin (CIPRO) 500 MG tablet Take 1 tablet (500 mg total) by mouth every 12 (twelve) hours. Patient not taking: Reported on 09/10/2015 05/05/15   Okey Regal, PA-C  HYDROmorphone (DILAUDID) 4 MG tablet Take 1 tablet (4 mg total) by mouth every 6 (six) hours as needed for severe pain. Patient not taking: Reported on 10/23/2015 10/11/15   Milton Ferguson, MD  ibuprofen (ADVIL,MOTRIN) 800 MG tablet Take 1 tablet (800 mg total) by mouth 3 (three) times daily. 10/23/15   Kjersti Dittmer C Hristopher Missildine, PA-C  ondansetron (ZOFRAN ODT) 4 MG disintegrating tablet 4mg  ODT q4 hours prn nausea/vomit Patient not taking: Reported on 10/23/2015 10/11/15   Milton Ferguson, MD  ondansetron (ZOFRAN) 4 MG tablet Take 1 tablet (4 mg total) by mouth  every 8 (eight) hours as needed for nausea or vomiting. Patient not taking: Reported on 10/23/2015 05/30/15   Serita Grit, MD  oxybutynin (DITROPAN) 5 MG tablet Take 1 tablet (5 mg total) by mouth every 8 (eight) hours as needed for bladder spasms. / stent discomfort Patient not taking: Reported on 10/11/2015 04/23/15   Alexis Frock, MD  oxyCODONE-acetaminophen (PERCOCET/ROXICET) 5-325 MG per tablet Take 1-2 tablets by mouth every 6 (six) hours as needed for severe pain. Patient not taking: Reported on 10/11/2015 05/30/15   Serita Grit, MD  senna-docusate (SENOKOT-S) 8.6-50 MG per tablet Take 1 tablet by mouth 2 (two) times daily. While taking pain meds to prevent constipation Patient not taking: Reported on 10/11/2015 04/23/15   Alexis Frock, MD  sulfamethoxazole-trimethoprim (BACTRIM DS,SEPTRA DS) 800-160 MG per tablet Take 1 tablet by mouth 2 (two) times daily. X 3 days, begin day prior to next Urology appointment. Patient not taking: Reported on 10/11/2015 04/23/15   Alexis Frock, MD   BP 133/81 mmHg  Pulse 86  Temp(Src) 97.8  F (36.6 C) (Oral)  Resp 18  SpO2 99%  LMP  (LMP Unknown) Physical Exam  Constitutional: She appears well-developed and well-nourished. No distress.  HENT:  Head: Normocephalic and atraumatic.  Eyes: Conjunctivae are normal. Pupils are equal, round, and reactive to light.  Cardiovascular: Normal rate, regular rhythm and normal heart sounds.   Pulmonary/Chest: Effort normal and breath sounds normal. No respiratory distress.  Abdominal: Soft. Normal appearance and bowel sounds are normal. There is no tenderness. There is no CVA tenderness.  Genitourinary: Rectum normal and uterus normal. Pelvic exam was performed with patient supine. There is no rash, tenderness or lesion on the right labia. There is no rash, tenderness or lesion on the left labia. Cervix exhibits no motion tenderness and no friability. Right adnexum displays no mass, no tenderness and no fullness.  Left adnexum displays no mass, no tenderness and no fullness. No tenderness or bleeding in the vagina.  Moderate amount of dark red blood present in vaginal vault and seemingly coming from the cervical os. No other discharge noted. Med Tech acted as chaperone during exam. Otherwise normal female genitalia.   Musculoskeletal: She exhibits no edema or tenderness.  Neurological: She is alert.  Skin: Skin is warm and dry. She is not diaphoretic.  Nursing note and vitals reviewed.   ED Course  Pelvic exam Date/Time: 10/23/2015 8:02 AM Performed by: Lorayne Bender Authorized by: Arlean Hopping C Consent: Verbal consent obtained. Risks and benefits: risks, benefits and alternatives were discussed Consent given by: patient Patient understanding: patient states understanding of the procedure being performed Patient consent: the patient's understanding of the procedure matches consent given Procedure consent: procedure consent matches procedure scheduled Patient identity confirmed: verbally with patient and arm band Time out: Immediately prior to procedure a "time out" was called to verify the correct patient, procedure, equipment, support staff and site/side marked as required. Local anesthesia used: no Patient sedated: no Patient tolerance: Patient tolerated the procedure well with no immediate complications   (including critical care time) Labs Review Labs Reviewed  WET PREP, GENITAL - Abnormal; Notable for the following:    WBC, Wet Prep HPF POC RARE (*)    All other components within normal limits  URINALYSIS, ROUTINE W REFLEX MICROSCOPIC (NOT AT [facility]) - Abnormal; Notable for the following:    Color, Urine AMBER (*)    APPearance CLOUDY (*)    Hgb urine dipstick LARGE (*)    Bilirubin Urine SMALL (*)    Protein, ur 30 (*)    Leukocytes, UA SMALL (*)    All other components within normal limits  URINE MICROSCOPIC-ADD ON - Abnormal; Notable for the following:    Squamous Epithelial / LPF  FEW (*)    All other components within normal limits  RPR  HIV ANTIBODY (ROUTINE TESTING)  POC URINE PREG, ED  GC/CHLAMYDIA PROBE AMP ([place]) NOT AT [facility]    Imaging Review No results found. I have personally reviewed and evaluated these images and lab results as part of my medical decision-making.   EKG Interpretation None      MDM   Final diagnoses:  Vaginal bleeding  Lower abdominal pain    Carol Ortega presents with heavy vaginal bleeding and lower abdominal discomfort bilaterally.  Findings and plan of care discussed with Noemi Chapel, MD.  Doubt torsion, diverticulitis, appendicitis. Suspect abnormal uterine bleeding possibly from the beginning of menopause versus uterine fibroids. Plan to have the patient discharged with pain medication and follow-up with her  OB/GYN as soon as possible. Plan of care communicated with patient who agreed and is comfortable with discharge.  Lorayne Bender, PA-C 10/23/15 [place], MD 10/24/15 [phone]

## 2015-10-23 NOTE — ED Notes (Signed)
Pt has been having vaginal bleeding intermittently since May and was seen yesterday in [facility] by the Obgyn and given a hormone replacement pill, this am she was having lots of cramping and passed a large amount of tissue through her vagina.

## 2015-10-23 NOTE — ED Notes (Signed)
Awake. Verbally responsive. A/O x4. Resp even and unlabored. No audible adventitious breath sounds noted. ABC's intact.  

## 2015-10-25 LAB — GC/CHLAMYDIA PROBE AMP (~~LOC~~) NOT AT ARMC
CHLAMYDIA, DNA PROBE: NEGATIVE
Neisseria Gonorrhea: NEGATIVE

## 2015-11-06 ENCOUNTER — Encounter (HOSPITAL_COMMUNITY): Payer: Self-pay

## 2015-11-06 ENCOUNTER — Emergency Department (HOSPITAL_COMMUNITY)
Admission: EM | Admit: 2015-11-06 | Discharge: 2015-11-06 | Disposition: A | Discharge status: Home / Self Care | Payer: Medicaid Other | Attending: Emergency Medicine | Admitting: Emergency Medicine

## 2015-11-06 ENCOUNTER — Emergency Department (HOSPITAL_COMMUNITY): Payer: Medicaid Other

## 2015-11-06 DIAGNOSIS — Z791 Long term (current) use of non-steroidal anti-inflammatories (NSAID): Secondary | ICD-10-CM | POA: Diagnosis not present

## 2015-11-06 DIAGNOSIS — Z91040 Latex allergy status: Secondary | ICD-10-CM | POA: Insufficient documentation

## 2015-11-06 DIAGNOSIS — F419 Anxiety disorder, unspecified: Secondary | ICD-10-CM | POA: Diagnosis not present

## 2015-11-06 DIAGNOSIS — E669 Obesity, unspecified: Secondary | ICD-10-CM | POA: Diagnosis not present

## 2015-11-06 DIAGNOSIS — R109 Unspecified abdominal pain: Secondary | ICD-10-CM | POA: Diagnosis not present

## 2015-11-06 DIAGNOSIS — Z79899 Other long term (current) drug therapy: Secondary | ICD-10-CM | POA: Diagnosis not present

## 2015-11-06 DIAGNOSIS — Z3202 Encounter for pregnancy test, result negative: Secondary | ICD-10-CM | POA: Diagnosis not present

## 2015-11-06 DIAGNOSIS — Z9049 Acquired absence of other specified parts of digestive tract: Secondary | ICD-10-CM | POA: Diagnosis not present

## 2015-11-06 DIAGNOSIS — Z87440 Personal history of urinary (tract) infections: Secondary | ICD-10-CM | POA: Diagnosis not present

## 2015-11-06 DIAGNOSIS — N1330 Unspecified hydronephrosis: Secondary | ICD-10-CM | POA: Insufficient documentation

## 2015-11-06 DIAGNOSIS — I10 Essential (primary) hypertension: Secondary | ICD-10-CM | POA: Diagnosis not present

## 2015-11-06 DIAGNOSIS — Z86011 Personal history of benign neoplasm of the brain: Secondary | ICD-10-CM | POA: Insufficient documentation

## 2015-11-06 DIAGNOSIS — F329 Major depressive disorder, single episode, unspecified: Secondary | ICD-10-CM | POA: Diagnosis not present

## 2015-11-06 DIAGNOSIS — E559 Vitamin D deficiency, unspecified: Secondary | ICD-10-CM | POA: Insufficient documentation

## 2015-11-06 DIAGNOSIS — Z880 Allergy status to penicillin: Secondary | ICD-10-CM | POA: Insufficient documentation

## 2015-11-06 DIAGNOSIS — Z87442 Personal history of urinary calculi: Secondary | ICD-10-CM | POA: Diagnosis not present

## 2015-11-06 DIAGNOSIS — J45909 Unspecified asthma, uncomplicated: Secondary | ICD-10-CM | POA: Insufficient documentation

## 2015-11-06 DIAGNOSIS — Z86711 Personal history of pulmonary embolism: Secondary | ICD-10-CM | POA: Insufficient documentation

## 2015-11-06 LAB — URINALYSIS, ROUTINE W REFLEX MICROSCOPIC
Bilirubin Urine: NEGATIVE
Glucose, UA: NEGATIVE mg/dL
Ketones, ur: NEGATIVE mg/dL
Leukocytes, UA: NEGATIVE
Nitrite: NEGATIVE
PH: 7 (ref 5.0–8.0)
Protein, ur: NEGATIVE mg/dL
Specific Gravity, Urine: 1.018 (ref 1.005–1.030)

## 2015-11-06 LAB — URINE MICROSCOPIC-ADD ON

## 2015-11-06 LAB — I-STAT BETA HCG BLOOD, ED (MC, WL, AP ONLY)

## 2015-11-06 MED ORDER — ONDANSETRON HCL 8 MG PO TABS: 8.0000 mg | ORAL_TABLET | Freq: Three times a day (TID) | ORAL | Status: DC | PRN

## 2015-11-06 MED ORDER — MORPHINE SULFATE (PF) 4 MG/ML IV SOLN
4.0000 mg | Freq: Once | INTRAVENOUS | Status: AC
  Administered 2015-11-06: 4 mg via INTRAVENOUS
  Filled 2015-11-06: qty 1

## 2015-11-06 MED ORDER — SODIUM CHLORIDE 0.9 % IV BOLUS (SEPSIS)
500.0000 mL | Freq: Once | INTRAVENOUS | Status: AC
  Administered 2015-11-06: 500 mL via INTRAVENOUS

## 2015-11-06 MED ORDER — TAMSULOSIN HCL 0.4 MG PO CAPS: 0.4000 mg | ORAL_CAPSULE | Freq: Every day | ORAL | Status: DC

## 2015-11-06 MED ORDER — HYDROCODONE-ACETAMINOPHEN 5-325 MG PO TABS: 2.0000 | ORAL_TABLET | ORAL | Status: DC | PRN

## 2015-11-06 MED ORDER — ONDANSETRON HCL 4 MG/2ML IJ SOLN
4.0000 mg | Freq: Once | INTRAMUSCULAR | Status: AC
  Administered 2015-11-06: 4 mg via INTRAVENOUS
  Filled 2015-11-06: qty 2

## 2015-11-06 NOTE — ED Provider Notes (Signed)
CSN: [id number]     Arrival date & time 11/06/15  1035 History   First MD Initiated Contact with Patient 11/06/15 1050     Chief Complaint  Patient presents with  . Flank Pain     (Consider location/radiation/quality/duration/timing/severity/associated sxs/prior Treatment) HPI....... left flank pain radiating to left lower quadrant since 03 100 today. Long history of kidney stones. No hematuria, fever, sweats, chills. Severity of pain is moderate. Nothing makes symptoms better or worse.  Past Medical History  Diagnosis Date  . Kidney stone   . UTI (urinary tract infection)   . Obesity   . Brain tumor ([place]) 2014    tx with radiation  . Radiation     Brain tumor  . Hypertension   . Pulmonary emboli ([place]) 2011  . Parathyroid abnormality ([place])   . Vitamin D deficiency   . Complication of anesthesia 1999    lung collapse after surgery for gallbladder  . Asthma     seasonal  . Headache     migraines  . Depression   . Anxiety    Past Surgical History  Procedure Laterality Date  . Kidney stone removal    . Lithotripsy    . Cystoscopy w/ ureteroscopy    . Cystoscopy with stent placement Left 01/12/2014    Procedure: CYSTOSCOPY WITH STENT PLACEMENT left retrograde;  Surgeon: Irine Seal, MD;  Location: [facility];  Service: Urology;  Laterality: Left;  . Surgery for,ectopic pregnancy  2011     1 fallopian tube rupture  . Tubal ligation  2011  . Cholecystectomy  1999  . Cystoscopy with retrograde pyelogram, ureteroscopy and stent placement Left 01/20/2014    Procedure: LEFT URETEROSCOPY WITH STENT PLACEMENT;  Surgeon: Irine Seal, MD;  [facility]: [facility];  Service: Urology;  Laterality: Left;  . Holmium laser application Left [id number]    Procedure: HOLMIUM LASER APPLICATION;  Surgeon: Irine Seal, MD;  [facility]: [facility];  Service: Urology;  Laterality: Left;  . Cystoscopy with retrograde pyelogram, ureteroscopy and stent placement Bilateral 04/23/2015    Procedure: CYSTOSCOPY WITH BILATERAL  RETROGRADE PYELOGRAM, URETEROSCOPY AND STENT PLACEMENT;  Surgeon: Alexis Frock, MD;  [facility];  Service: Urology;  Laterality: Bilateral;  . Holmium laser application Bilateral [id number]    Procedure: HOLMIUM LASER APPLICATION;  Surgeon: Alexis Frock, MD;  [facility];  Service: Urology;  Laterality: Bilateral;   Family History  Problem Relation Age of Onset  . Bell's palsy Mother   . Heart failure Mother   . Asthma Father   . Hypertension Father    Social History  Substance Use Topics  . Smoking status: Never Smoker   . Smokeless tobacco: Never Used  . Alcohol Use: Yes     Comment: social only   OB History    No data available     Review of Systems  All other systems reviewed and are negative.     Allergies  Bee venom; Eggs or egg-derived products; Other; Gadobenate; Penicillins; Latex; Oxycodone-acetaminophen; and Penicillin g  Home Medications   Prior to Admission medications   Medication Sig Start Date End Date Taking? Authorizing Provider  busPIRone (BUSPAR) 7.5 MG tablet Take 7.5 mg by mouth 2 (two) times daily.   Yes Historical Provider, MD  lisinopril-hydrochlorothiazide (PRINZIDE,ZESTORETIC) 20-12.5 MG per tablet Take 1 tablet by mouth every evening.    Yes Historical Provider, MD  Vitamin D, Ergocalciferol, (DRISDOL) 50000 UNITS CAPS capsule Take 1 capsule (50,000 Units total) by mouth every 7 (seven) days.  Patient taking differently: Take 50,000 Units by mouth every 7 (seven) days. Monday 09/10/15  Yes Philemon Kingdom, MD  HYDROcodone-acetaminophen (NORCO/VICODIN) 5-325 MG tablet Take 2 tablets by mouth every 4 (four) hours as needed. 11/06/15   Nat Christen, MD  HYDROmorphone (DILAUDID) 4 MG tablet Take 1 tablet (4 mg total) by mouth every 6 (six) hours as needed for severe pain. Patient not taking: Reported on 10/23/2015 10/11/15   Milton Ferguson, MD  ibuprofen (ADVIL,MOTRIN) 800 MG tablet Take 1 tablet (800 mg total) by mouth 3 (three) times daily.  10/23/15   Shawn C Joy, PA-C  ondansetron (ZOFRAN) 8 MG tablet Take 1 tablet (8 mg total) by mouth every 8 (eight) hours as needed for nausea or vomiting. 11/06/15   Nat Christen, MD  tamsulosin (FLOMAX) 0.4 MG CAPS capsule Take 1 capsule (0.4 mg total) by mouth daily. 11/06/15   Nat Christen, MD   BP 128/91 mmHg  Pulse 87  Temp(Src) 98.9 F (37.2 C) (Oral)  Resp 19  Ht 5\' 7"  (1.702 m)  Wt 320 lb (145.151 kg)  BMI 50.11 kg/m2  SpO2 100%  LMP  (LMP Unknown) Physical Exam  Constitutional: She is oriented to person, place, and time.  Obese  HENT:  Head: Normocephalic and atraumatic.  Eyes: Conjunctivae and EOM are normal. Pupils are equal, round, and reactive to light.  Neck: Normal range of motion. Neck supple.  Cardiovascular: Normal rate and regular rhythm.   Pulmonary/Chest: Effort normal and breath sounds normal.  Abdominal: Soft. Bowel sounds are normal.  Genitourinary:  Tender left flank  Musculoskeletal: Normal range of motion.  Neurological: She is alert and oriented to person, place, and time.  Skin: Skin is warm and dry.  Psychiatric: She has a normal mood and affect. Her behavior is normal.  Nursing note and vitals reviewed.   ED Course  Procedures (including critical care time) Labs Review Labs Reviewed  URINALYSIS, ROUTINE W REFLEX MICROSCOPIC (NOT AT [facility]) - Abnormal; Notable for the following:    Hgb urine dipstick SMALL (*)    All other components within normal limits  URINE MICROSCOPIC-ADD ON - Abnormal; Notable for the following:    Squamous Epithelial / LPF 0-5 (*)    Bacteria, UA FEW (*)    All other components within normal limits  I-STAT BETA HCG BLOOD, ED ([facility], [facility], [facility] ONLY)    Imaging Review US Renal  11/06/2015  CLINICAL DATA:  Patient with left flank pain for 1 day. EXAM: RENAL / URINARY TRACT ULTRASOUND COMPLETE COMPARISON:  CT abdomen pelvis 10/11/2015. FINDINGS: Right Kidney: Length: 14.2 cm. Normal renal cortical thickness and echogenicity. No  hydronephrosis. There are a few tiny punctate echogenic foci most compatible with known renal stones. There is a 1.7 cm cyst within the right kidney. Left Kidney: Length: 15.7 cm. Mild hydronephrosis. Multiple small cyst within the left kidney. Additionally there are small punctate hyperechoic foci compatible with known small stones. Bladder: Appears normal for degree of bladder distention. IMPRESSION: Mild left hydronephrosis. Bilateral renal stones. Electronically Signed   By: Lovey Newcomer M.D.   On: 11/06/2015 12:22   I have personally reviewed and evaluated these images and lab results as part of my medical decision-making.   EKG Interpretation None      MDM   Final diagnoses:  Left flank pain    Patient has had multiple CT scans in past. Ultrasound reveals mild left hydronephrosis. Urinalysis shows hemoglobin. Patient feels better after pain management. She will follow-up with  urology. Discharge medications Vicodin, Zofran 8 mg, Flomax 0.4 mg.    Nat Christen, MD 11/06/15 1500

## 2015-11-06 NOTE — ED Notes (Signed)
Patient c/o left flank pain since 0300. Patient has a history of kidney stones.

## 2015-11-06 NOTE — Discharge Instructions (Signed)
Follow-up with urologist. Medications for pain, nausea, and to increase urinary flow.

## 2016-01-28 ENCOUNTER — Encounter (HOSPITAL_COMMUNITY): Payer: Self-pay

## 2016-01-28 ENCOUNTER — Emergency Department (HOSPITAL_COMMUNITY): Payer: Medicaid Other

## 2016-01-28 ENCOUNTER — Emergency Department (HOSPITAL_COMMUNITY)
Admission: EM | Admit: 2016-01-28 | Discharge: 2016-01-28 | Disposition: A | Discharge status: Home / Self Care | Payer: Medicaid Other | Attending: Emergency Medicine | Admitting: Emergency Medicine

## 2016-01-28 DIAGNOSIS — E669 Obesity, unspecified: Secondary | ICD-10-CM | POA: Diagnosis not present

## 2016-01-28 DIAGNOSIS — Z87440 Personal history of urinary (tract) infections: Secondary | ICD-10-CM | POA: Insufficient documentation

## 2016-01-28 DIAGNOSIS — Z9851 Tubal ligation status: Secondary | ICD-10-CM | POA: Diagnosis not present

## 2016-01-28 DIAGNOSIS — I10 Essential (primary) hypertension: Secondary | ICD-10-CM | POA: Diagnosis not present

## 2016-01-28 DIAGNOSIS — F419 Anxiety disorder, unspecified: Secondary | ICD-10-CM | POA: Insufficient documentation

## 2016-01-28 DIAGNOSIS — Z880 Allergy status to penicillin: Secondary | ICD-10-CM | POA: Insufficient documentation

## 2016-01-28 DIAGNOSIS — Z86011 Personal history of benign neoplasm of the brain: Secondary | ICD-10-CM | POA: Insufficient documentation

## 2016-01-28 DIAGNOSIS — N23 Unspecified renal colic: Secondary | ICD-10-CM | POA: Insufficient documentation

## 2016-01-28 DIAGNOSIS — Z79899 Other long term (current) drug therapy: Secondary | ICD-10-CM | POA: Diagnosis not present

## 2016-01-28 DIAGNOSIS — Z9049 Acquired absence of other specified parts of digestive tract: Secondary | ICD-10-CM | POA: Insufficient documentation

## 2016-01-28 DIAGNOSIS — J45909 Unspecified asthma, uncomplicated: Secondary | ICD-10-CM | POA: Insufficient documentation

## 2016-01-28 DIAGNOSIS — Z3202 Encounter for pregnancy test, result negative: Secondary | ICD-10-CM | POA: Insufficient documentation

## 2016-01-28 DIAGNOSIS — Z86711 Personal history of pulmonary embolism: Secondary | ICD-10-CM | POA: Diagnosis not present

## 2016-01-28 DIAGNOSIS — Z9889 Other specified postprocedural states: Secondary | ICD-10-CM | POA: Diagnosis not present

## 2016-01-28 DIAGNOSIS — Z91040 Latex allergy status: Secondary | ICD-10-CM | POA: Insufficient documentation

## 2016-01-28 DIAGNOSIS — E559 Vitamin D deficiency, unspecified: Secondary | ICD-10-CM | POA: Insufficient documentation

## 2016-01-28 DIAGNOSIS — R1030 Lower abdominal pain, unspecified: Secondary | ICD-10-CM | POA: Diagnosis present

## 2016-01-28 LAB — COMPREHENSIVE METABOLIC PANEL
ALBUMIN: 3.7 g/dL (ref 3.5–5.0)
ALK PHOS: 120 U/L (ref 38–126)
ALT: 16 U/L (ref 14–54)
ANION GAP: 7 (ref 5–15)
AST: 17 U/L (ref 15–41)
BILIRUBIN TOTAL: 0.5 mg/dL (ref 0.3–1.2)
BUN: 12 mg/dL (ref 6–20)
CALCIUM: 11.2 mg/dL — AB (ref 8.9–10.3)
CO2: 25 mmol/L (ref 22–32)
CREATININE: 0.74 mg/dL (ref 0.44–1.00)
Chloride: 109 mmol/L (ref 101–111)
GFR calc Af Amer: >60 mL/min (ref >60)
GFR calc non Af Amer: >60 mL/min (ref >60)
GLUCOSE: 117 mg/dL — AB (ref 65–99)
Potassium: 4.3 mmol/L (ref 3.5–5.1)
Sodium: 141 mmol/L (ref 135–145)
TOTAL PROTEIN: 7.6 g/dL (ref 6.5–8.1)

## 2016-01-28 LAB — URINALYSIS, ROUTINE W REFLEX MICROSCOPIC
BILIRUBIN URINE: NEGATIVE
BILIRUBIN URINE: NEGATIVE
GLUCOSE, UA: NEGATIVE mg/dL
Glucose, UA: NEGATIVE mg/dL
HGB URINE DIPSTICK: NEGATIVE
KETONES UR: NEGATIVE mg/dL
Ketones, ur: NEGATIVE mg/dL
LEUKOCYTES UA: NEGATIVE
NITRITE: POSITIVE — AB
Nitrite: POSITIVE — AB
PH: 5.5 (ref 5.0–8.0)
Protein, ur: NEGATIVE mg/dL
Protein, ur: NEGATIVE mg/dL
SPECIFIC GRAVITY, URINE: 1.02 (ref 1.005–1.030)
SPECIFIC GRAVITY, URINE: 1.019 (ref 1.005–1.030)
pH: 6 (ref 5.0–8.0)

## 2016-01-28 LAB — CBC WITH DIFFERENTIAL/PLATELET
BASOS PCT: 1 %
Basophils Absolute: 0 10*3/uL (ref 0.0–0.1)
EOS ABS: 0.2 10*3/uL (ref 0.0–0.7)
EOS PCT: 3 %
HEMATOCRIT: 40.4 % (ref 36.0–46.0)
HEMOGLOBIN: 12.1 g/dL (ref 12.0–15.0)
Lymphocytes Relative: 28 %
Lymphs Abs: 1.5 10*3/uL (ref 0.7–4.0)
MCH: 26.4 pg (ref 26.0–34.0)
MCHC: 30 g/dL (ref 30.0–36.0)
MCV: 88.2 fL (ref 78.0–100.0)
Monocytes Absolute: 0.8 10*3/uL (ref 0.1–1.0)
Monocytes Relative: 14 %
NEUTROS PCT: 54 %
Neutro Abs: 3.1 10*3/uL (ref 1.7–7.7)
Platelets: 323 10*3/uL (ref 150–400)
RBC: 4.58 MIL/uL (ref 3.87–5.11)
RDW: 14.5 % (ref 11.5–15.5)
WBC: 5.6 10*3/uL (ref 4.0–10.5)

## 2016-01-28 LAB — URINE MICROSCOPIC-ADD ON

## 2016-01-28 LAB — POC URINE PREG, ED: PREG TEST UR: NEGATIVE

## 2016-01-28 MED ORDER — HYDROMORPHONE HCL 1 MG/ML IJ SOLN
1.0000 mg | Freq: Once | INTRAMUSCULAR | Status: AC
  Administered 2016-01-28: 1 mg via INTRAVENOUS
  Filled 2016-01-28: qty 1

## 2016-01-28 MED ORDER — SODIUM CHLORIDE 0.9 % IV BOLUS (SEPSIS)
1000.0000 mL | Freq: Once | INTRAVENOUS | Status: AC
  Administered 2016-01-28: 1000 mL via INTRAVENOUS

## 2016-01-28 MED ORDER — TAMSULOSIN HCL 0.4 MG PO CAPS: 0.4000 mg | ORAL_CAPSULE | Freq: Every day | ORAL | Status: DC

## 2016-01-28 MED ORDER — HYDROCODONE-ACETAMINOPHEN 10-325 MG PO TABS: 1.0000 | ORAL_TABLET | Freq: Four times a day (QID) | ORAL | Status: DC | PRN

## 2016-01-28 MED ORDER — TAMSULOSIN HCL 0.4 MG PO CAPS
0.4000 mg | ORAL_CAPSULE | Freq: Once | ORAL | Status: AC
  Administered 2016-01-28: 0.4 mg via ORAL
  Filled 2016-01-28: qty 1

## 2016-01-28 MED ORDER — ONDANSETRON HCL 4 MG/2ML IJ SOLN
4.0000 mg | Freq: Once | INTRAMUSCULAR | Status: AC
  Administered 2016-01-28: 4 mg via INTRAVENOUS
  Filled 2016-01-28: qty 2

## 2016-01-28 NOTE — Discharge Instructions (Signed)
Kidney Stones °Kidney stones (urolithiasis) are deposits that form inside your kidneys. The intense pain is caused by the stone moving through the urinary tract. When the stone moves, the ureter goes into spasm around the stone. The stone is usually passed in the urine.  °CAUSES  °· A disorder that makes certain neck glands produce too much parathyroid hormone (primary hyperparathyroidism). °· A buildup of uric acid crystals, similar to gout in your joints. °· Narrowing (stricture) of the ureter. °· A kidney obstruction present at birth (congenital obstruction). °· Previous surgery on the kidney or ureters. °· Numerous kidney infections. °SYMPTOMS  °· Feeling sick to your stomach (nauseous). °· Throwing up (vomiting). °· Blood in the urine (hematuria). °· Pain that usually spreads (radiates) to the groin. °· Frequency or urgency of urination. °DIAGNOSIS  °· Taking a history and physical exam. °· Blood or urine tests. °· CT scan. °· Occasionally, an examination of the inside of the urinary bladder (cystoscopy) is performed. °TREATMENT  °· Observation. °· Increasing your fluid intake. °· Extracorporeal shock wave lithotripsy--This is a noninvasive procedure that uses shock waves to break up kidney stones. °· Surgery may be needed if you have severe pain or persistent obstruction. There are various surgical procedures. Most of the procedures are performed with the use of small instruments. Only small incisions are needed to accommodate these instruments, so recovery time is minimized. °The size, location, and chemical composition are all important variables that will determine the proper choice of action for you. Talk to your health care provider to better understand your situation so that you will minimize the risk of injury to yourself and your kidney.  °HOME CARE INSTRUCTIONS  °· Drink enough water and fluids to keep your urine clear or pale yellow. This will help you to pass the stone or stone fragments. °· Strain  all urine through the provided strainer. Keep all particulate matter and stones for your health care provider to see. The stone causing the pain may be as small as a grain of salt. It is very important to use the strainer each and every time you pass your urine. The collection of your stone will allow your health care provider to analyze it and verify that a stone has actually passed. The stone analysis will often identify what you can do to reduce the incidence of recurrences. °· Only take over-the-counter or prescription medicines for pain, discomfort, or fever as directed by your health care provider. °· Keep all follow-up visits as told by your health care provider. This is important. °· Get follow-up X-rays if required. The absence of pain does not always mean that the stone has passed. It may have only stopped moving. If the urine remains completely obstructed, it can cause loss of kidney function or even complete destruction of the kidney. It is your responsibility to make sure X-rays and follow-ups are completed. Ultrasounds of the kidney can show blockages and the status of the kidney. Ultrasounds are not associated with any radiation and can be performed easily in a matter of minutes. °· Make changes to your daily diet as told by your health care provider. You may be told to: °¨ Limit the amount of salt that you eat. °¨ Eat 5 or more servings of fruits and vegetables each day. °¨ Limit the amount of meat, poultry, fish, and eggs that you eat. °· Collect a 24-hour urine sample as told by your health care provider. You may need to collect another urine sample every 6-12   months. °SEEK MEDICAL CARE IF: °· You experience pain that is progressive and unresponsive to any pain medicine you have been prescribed. °SEEK IMMEDIATE MEDICAL CARE IF:  °· Pain cannot be controlled with the prescribed medicine. °· You have a fever or shaking chills. °· The severity or intensity of pain increases over 18 hours and is not  relieved by pain medicine. °· You develop a new onset of abdominal pain. °· You feel faint or pass out. °· You are unable to urinate. °  °This information is not intended to replace advice given to you by your health care provider. Make sure you discuss any questions you have with your health care provider. °  °Document Released: 12/04/2005 Document Revised: 08/25/2015 Document Reviewed: 05/07/2013 °Elsevier Interactive Patient Education ©2016 [employer] ° °

## 2016-01-28 NOTE — ED Notes (Signed)
Per EMS patient from home had left flank pain that began x2 days ago.  Pain has now moved to her lower quadrant pain.  Per EMS patient with a Hx of kidney stones.  [facility] gave 30mg  torodol and 4mg  zofran en route.  Per EMS patient ambulated to truck.

## 2016-01-28 NOTE — ED Provider Notes (Signed)
CSN: [id number]     Arrival date & time 01/28/16  0605 History   First MD Initiated Contact with Patient 01/28/16 [phone]     Chief Complaint  Patient presents with  . Flank Pain     Patient is a 45 y.o. female presenting with flank pain. The history is provided by the patient. No language interpreter was used.  Flank Pain   Carol Ortega is a 45 y.o. female who presents to the Emergency Department complaining of right flank pain.  She reports 2 days of lower abdominal/bladder pressure. She thought she was coming down with the GI bug. She had associated urinary frequency and foul-smelling urine. She took Azo. This morning she developed right flank pain, vomiting. She states the flank pain feels like her prior kidney stones. She denies any fevers, diarrhea. Symptoms are moderate, constant, worsening.  Past Medical History  Diagnosis Date  . Kidney stone   . UTI (urinary tract infection)   . Obesity   . Brain tumor ([place]) 2014    tx with radiation  . Radiation     Brain tumor  . Hypertension   . Pulmonary emboli ([place]) 2011  . Parathyroid abnormality ([place])   . Vitamin D deficiency   . Complication of anesthesia 1999    lung collapse after surgery for gallbladder  . Asthma     seasonal  . Headache     migraines  . Depression   . Anxiety    Past Surgical History  Procedure Laterality Date  . Kidney stone removal    . Lithotripsy    . Cystoscopy w/ ureteroscopy    . Cystoscopy with stent placement Left 01/12/2014    Procedure: CYSTOSCOPY WITH STENT PLACEMENT left retrograde;  Surgeon: Irine Seal, MD;  [facility];  Service: Urology;  Laterality: Left;  . Surgery for,ectopic pregnancy  2011     1 fallopian tube rupture  . Tubal ligation  2011  . Cholecystectomy  1999  . Cystoscopy with retrograde pyelogram, ureteroscopy and stent placement Left 01/20/2014    Procedure: LEFT URETEROSCOPY WITH STENT PLACEMENT;  Surgeon: Irine Seal, MD;  [facility];  Service: Urology;   Laterality: Left;  . Holmium laser application Left [id number]    Procedure: HOLMIUM LASER APPLICATION;  Surgeon: Irine Seal, MD;  [facility];  Service: Urology;  Laterality: Left;  . Cystoscopy with retrograde pyelogram, ureteroscopy and stent placement Bilateral 04/23/2015    Procedure: CYSTOSCOPY WITH BILATERAL RETROGRADE PYELOGRAM, URETEROSCOPY AND STENT PLACEMENT;  Surgeon: Alexis Frock, MD;  [facility];  Service: Urology;  Laterality: Bilateral;  . Holmium laser application Bilateral [id number]    Procedure: HOLMIUM LASER APPLICATION;  Surgeon: Alexis Frock, MD;  [facility];  Service: Urology;  Laterality: Bilateral;   Family History  Problem Relation Age of Onset  . Bell's palsy Mother   . Heart failure Mother   . Asthma Father   . Hypertension Father    Social History  Substance Use Topics  . Smoking status: Never Smoker   . Smokeless tobacco: Never Used  . Alcohol Use: Yes     Comment: social only   OB History    No data available     Review of Systems  Genitourinary: Positive for flank pain.  All other systems reviewed and are negative.     Allergies  Bee venom; Eggs or egg-derived products; Other; Gadobenate; Penicillins; Latex; Oxycodone-acetaminophen; and Penicillin g  Home Medications   Prior to Admission medications  Medication Sig Start Date End Date Taking? Authorizing Provider  busPIRone (BUSPAR) 7.5 MG tablet Take 7.5 mg by mouth 2 (two) times daily.   Yes Historical Provider, MD  ibuprofen (ADVIL,MOTRIN) 200 MG tablet Take 400 mg by mouth every 6 (six) hours as needed for headache, mild pain or moderate pain.   Yes Historical Provider, MD  lisinopril-hydrochlorothiazide (PRINZIDE,ZESTORETIC) 20-12.5 MG per tablet Take 1 tablet by mouth daily as needed (blood pressure).    Yes Historical Provider, MD  Vitamin D, Ergocalciferol, (DRISDOL) 50000 UNITS CAPS capsule Take 1 capsule (50,000 Units total) by mouth every 7 (seven) days. Patient  taking differently: Take 50,000 Units by mouth every 7 (seven) days. Monday 09/10/15  Yes Philemon Kingdom, MD  HYDROcodone-acetaminophen (NORCO) 10-325 MG tablet Take 1 tablet by mouth every 6 (six) hours as needed. 01/28/16   Quintella Reichert, MD  tamsulosin (FLOMAX) 0.4 MG CAPS capsule Take 1 capsule (0.4 mg total) by mouth daily. 01/28/16   Quintella Reichert, MD   BP 123/91 mmHg  Pulse 89  Temp(Src) 98.6 F (37 C) (Oral)  Resp 17  SpO2 99%  LMP  Physical Exam  Constitutional: She is oriented to person, place, and time. She appears well-developed and well-nourished.  HENT:  Head: Normocephalic and atraumatic.  Cardiovascular: Normal rate and regular rhythm.   No murmur heard. Pulmonary/Chest: Effort normal and breath sounds normal. No respiratory distress.  Abdominal: Soft. There is no rebound and no guarding.  Obese abdomen. Mild diffuse tenderness. No CVA tenderness.  Musculoskeletal: She exhibits no edema or tenderness.  Neurological: She is alert and oriented to person, place, and time.  Skin: Skin is warm and dry.  Psychiatric: She has a normal mood and affect. Her behavior is normal.  Nursing note and vitals reviewed.   ED Course  Procedures (including critical care time) Labs Review Labs Reviewed  URINALYSIS, ROUTINE W REFLEX MICROSCOPIC (NOT AT [facility]) - Abnormal; Notable for the following:    Color, Urine ORANGE (*)    APPearance CLOUDY (*)    Hgb urine dipstick MODERATE (*)    Nitrite POSITIVE (*)    All other components within normal limits  COMPREHENSIVE METABOLIC PANEL - Abnormal; Notable for the following:    Glucose, Bld 117 (*)    Calcium 11.2 (*)    All other components within normal limits  URINE MICROSCOPIC-ADD ON - Abnormal; Notable for the following:    Squamous Epithelial / LPF 6-30 (*)    Bacteria, UA MANY (*)    All other components within normal limits  URINALYSIS, ROUTINE W REFLEX MICROSCOPIC (NOT AT [facility]) - Abnormal; Notable for the following:     Color, Urine AMBER (*)    Nitrite POSITIVE (*)    Leukocytes, UA SMALL (*)    All other components within normal limits  URINE MICROSCOPIC-ADD ON - Abnormal; Notable for the following:    Squamous Epithelial / LPF 0-5 (*)    Bacteria, UA FEW (*)    All other components within normal limits  URINE CULTURE  CBC WITH DIFFERENTIAL/PLATELET  POC URINE PREG, ED    Imaging Review Ct Renal Stone Study  01/28/2016  CLINICAL DATA:  Right flank pain.  History of kidney stones. EXAM: CT ABDOMEN AND PELVIS WITHOUT CONTRAST TECHNIQUE: Multidetector CT imaging of the abdomen and pelvis was performed following the standard protocol without IV contrast. COMPARISON:  CT scan dated 10/11/2015 FINDINGS: Lower chest:  Normal. Hepatobiliary: The gallbladder has been removed.  Otherwise, normal. Pancreas: Normal. Spleen:  Normal. Adrenals/Urinary Tract: There is a 6 mm stone obstructing the right ureteral pelvic junction with mild right hydronephrosis. 4 mm stone in the mid right kidney. 3 mm stone in the mid left kidney. No left hydronephrosis. The left ureter is normal. Distal right ureter is normal. Bladder is normal. Adrenal glands are normal. Stomach/Bowel: Normal. Vascular/Lymphatic: Minimal calcification in the right common iliac artery. Otherwise normal. Reproductive: Normal. Other: No mass lesions or adenopathy. Musculoskeletal: No significant abnormalities. IMPRESSION: 6 mm stone obstructing the right ureteral pelvic junction creating slight right hydronephrosis. Small bilateral renal calculi. Electronically Signed   By: Lorriane Shire M.D.   On: 01/28/2016 08:46   I have personally reviewed and evaluated these images and lab results as part of my medical decision-making.   EKG Interpretation None      MDM   Final diagnoses:  Renal colic on right side    Patient here for evaluation of right flank pain, has a history of UTI, currently on Pyridium. UA is difficult to interpret based on Pyridium use.  Initially UA had contaminant with epithelials. Repeat UA with in and out catheter is not consistent with acute infectious process. Discussed with patient care for renal colic, oral fluid hydration, urology follow-up, close return precautions. Discussed the case with Dr. Tresa Moore the patient's urologist.  Quintella Reichert, MD 01/28/16 [phone]

## 2016-01-28 NOTE — ED Notes (Signed)
Bed: [id number] Expected date:  Expected time:  Means of arrival:  Comments: Right lower quad pain

## 2016-01-29 LAB — URINE CULTURE: CULTURE: NO GROWTH

## 2016-02-02 ENCOUNTER — Emergency Department (HOSPITAL_COMMUNITY): Payer: Medicaid Other

## 2016-02-02 ENCOUNTER — Emergency Department (HOSPITAL_COMMUNITY)
Admission: EM | Admit: 2016-02-02 | Discharge: 2016-02-03 | Disposition: A | Discharge status: Home / Self Care | Payer: Medicaid Other | Attending: Emergency Medicine | Admitting: Emergency Medicine

## 2016-02-02 ENCOUNTER — Encounter (HOSPITAL_COMMUNITY): Payer: Self-pay | Admitting: Emergency Medicine

## 2016-02-02 DIAGNOSIS — Z86711 Personal history of pulmonary embolism: Secondary | ICD-10-CM | POA: Insufficient documentation

## 2016-02-02 DIAGNOSIS — Z880 Allergy status to penicillin: Secondary | ICD-10-CM | POA: Diagnosis not present

## 2016-02-02 DIAGNOSIS — Z87440 Personal history of urinary (tract) infections: Secondary | ICD-10-CM | POA: Insufficient documentation

## 2016-02-02 DIAGNOSIS — Z91040 Latex allergy status: Secondary | ICD-10-CM | POA: Diagnosis not present

## 2016-02-02 DIAGNOSIS — F430 Acute stress reaction: Secondary | ICD-10-CM

## 2016-02-02 DIAGNOSIS — I10 Essential (primary) hypertension: Secondary | ICD-10-CM | POA: Insufficient documentation

## 2016-02-02 DIAGNOSIS — E559 Vitamin D deficiency, unspecified: Secondary | ICD-10-CM | POA: Insufficient documentation

## 2016-02-02 DIAGNOSIS — J45909 Unspecified asthma, uncomplicated: Secondary | ICD-10-CM | POA: Insufficient documentation

## 2016-02-02 DIAGNOSIS — Z87442 Personal history of urinary calculi: Secondary | ICD-10-CM | POA: Diagnosis not present

## 2016-02-02 DIAGNOSIS — F410 Panic disorder [episodic paroxysmal anxiety] without agoraphobia: Secondary | ICD-10-CM | POA: Diagnosis not present

## 2016-02-02 DIAGNOSIS — Z86011 Personal history of benign neoplasm of the brain: Secondary | ICD-10-CM | POA: Diagnosis not present

## 2016-02-02 DIAGNOSIS — Z79899 Other long term (current) drug therapy: Secondary | ICD-10-CM | POA: Diagnosis not present

## 2016-02-02 DIAGNOSIS — R079 Chest pain, unspecified: Secondary | ICD-10-CM | POA: Diagnosis present

## 2016-02-02 DIAGNOSIS — E669 Obesity, unspecified: Secondary | ICD-10-CM | POA: Diagnosis not present

## 2016-02-02 LAB — BASIC METABOLIC PANEL
ANION GAP: 7 (ref 5–15)
BUN: 10 mg/dL (ref 6–20)
CHLORIDE: 104 mmol/L (ref 101–111)
CO2: 29 mmol/L (ref 22–32)
Calcium: 11.6 mg/dL — ABNORMAL HIGH (ref 8.9–10.3)
Creatinine, Ser: 0.81 mg/dL (ref 0.44–1.00)
GFR calc Af Amer: >60 mL/min (ref >60)
Glucose, Bld: 103 mg/dL — ABNORMAL HIGH (ref 65–99)
POTASSIUM: 3.8 mmol/L (ref 3.5–5.1)
SODIUM: 140 mmol/L (ref 135–145)

## 2016-02-02 LAB — CBC
HEMATOCRIT: 41.5 % (ref 36.0–46.0)
HEMOGLOBIN: 13 g/dL (ref 12.0–15.0)
MCH: 27.1 pg (ref 26.0–34.0)
MCHC: 31.3 g/dL (ref 30.0–36.0)
MCV: 86.5 fL (ref 78.0–100.0)
Platelets: 336 10*3/uL (ref 150–400)
RBC: 4.8 MIL/uL (ref 3.87–5.11)
RDW: 14.2 % (ref 11.5–15.5)
WBC: 6.7 10*3/uL (ref 4.0–10.5)

## 2016-02-02 LAB — I-STAT TROPONIN, ED: Troponin i, poc: 0 ng/mL (ref 0.00–0.08)

## 2016-02-02 MED ORDER — HYDROCHLOROTHIAZIDE 12.5 MG PO CAPS
12.5000 mg | ORAL_CAPSULE | Freq: Every day | ORAL | Status: DC | PRN
  Administered 2016-02-02: 12.5 mg via ORAL
  Filled 2016-02-02: qty 1

## 2016-02-02 MED ORDER — LORAZEPAM 1 MG PO TABS
2.0000 mg | ORAL_TABLET | Freq: Once | ORAL | Status: AC
  Administered 2016-02-02: 2 mg via SUBLINGUAL
  Filled 2016-02-02: qty 2

## 2016-02-02 MED ORDER — LISINOPRIL-HYDROCHLOROTHIAZIDE 20-12.5 MG PO TABS: 1.0000 | ORAL_TABLET | Freq: Every day | ORAL | Status: DC | PRN

## 2016-02-02 MED ORDER — LISINOPRIL 20 MG PO TABS
20.0000 mg | ORAL_TABLET | Freq: Every day | ORAL | Status: DC | PRN
  Administered 2016-02-02: 20 mg via ORAL
  Filled 2016-02-02: qty 1

## 2016-02-02 NOTE — ED Notes (Addendum)
Pt stated "I have a hx of anxiety and take buspar.  I have a 45 y/o at home I don't know what to do with and an autistic child that I feel I can't participate in things with him."  Questioned if pt had taken b/p med today.  Pt denied stating "I didn't take it and I haven't eaten today either."

## 2016-02-02 NOTE — ED Provider Notes (Signed)
CSN: [id number]     Arrival date & time 02/02/16  1856 History   First MD Initiated Contact with Patient 02/02/16 2210     Chief Complaint  Patient presents with  . Anxiety  . Chest Pain     (Consider location/radiation/quality/duration/timing/severity/associated sxs/prior Treatment) HPI   Blood pressure 159/112, pulse 88, temperature 98.2 F (36.8 C), temperature source Oral, resp. rate 20, SpO2 98 %.  Carol Ortega is a 45 y.o. female complaining of left-sided nonradiating chest pain onset this a.m., it's associated with episodes of anxiety, when patient has control of her breathing and is calm chest pain resolves. Patient states she is particular stressed out because her 45 year old just had a child, her older child has a pregnant girlfriend is moving in, she has a young autistic child that she feels that she's not able to give enough attention to. She also states that she has a benign brain tumor has been taking BuSpar for her anxiety, but does not feel that it is helping. She denies suicidal ideation, homicidal ideation, auditory or visual hallucinations, alcohol or drug abuse. She has a history of PE, she is not anticoagulated, states that it was provoked secondary to pelvic surgery. Patient denies recent immobilization, calf pain, leg swelling, pleuritic chest pain, cough, shortness of breath, fever, hemoptysis. Patient has not taken any medications today.   Past Medical History  Diagnosis Date  . Kidney stone   . UTI (urinary tract infection)   . Obesity   . Brain tumor ([place]) 2014    tx with radiation  . Radiation     Brain tumor  . Hypertension   . Pulmonary emboli ([place]) 2011  . Parathyroid abnormality ([place])   . Vitamin D deficiency   . Complication of anesthesia 1999    lung collapse after surgery for gallbladder  . Asthma     seasonal  . Headache     migraines  . Depression   . Anxiety    Past Surgical History  Procedure Laterality Date  . Kidney stone removal     . Lithotripsy    . Cystoscopy w/ ureteroscopy    . Cystoscopy with stent placement Left 01/12/2014    Procedure: CYSTOSCOPY WITH STENT PLACEMENT left retrograde;  Surgeon: Irine Seal, MD;  Location: [facility];  Service: Urology;  Laterality: Left;  . Surgery for,ectopic pregnancy  2011     1 fallopian tube rupture  . Tubal ligation  2011  . Cholecystectomy  1999  . Cystoscopy with retrograde pyelogram, ureteroscopy and stent placement Left 01/20/2014    Procedure: LEFT URETEROSCOPY WITH STENT PLACEMENT;  Surgeon: Irine Seal, MD;  Location: [facility];  Service: Urology;  Laterality: Left;  . Holmium laser application Left [id number]    Procedure: HOLMIUM LASER APPLICATION;  Surgeon: Irine Seal, MD;  Location: [facility];  Service: Urology;  Laterality: Left;  . Cystoscopy with retrograde pyelogram, ureteroscopy and stent placement Bilateral 04/23/2015    Procedure: CYSTOSCOPY WITH BILATERAL RETROGRADE PYELOGRAM, URETEROSCOPY AND STENT PLACEMENT;  Surgeon: Alexis Frock, MD;  Location: [facility];  Service: Urology;  Laterality: Bilateral;  . Holmium laser application Bilateral [id number]    Procedure: HOLMIUM LASER APPLICATION;  Surgeon: Alexis Frock, MD;  Location: [facility];  Service: Urology;  Laterality: Bilateral;   Family History  Problem Relation Age of Onset  . Bell's palsy Mother   . Heart failure Mother   . Asthma Father   . Hypertension Father    Social History  Substance Use Topics  .  Smoking status: Never Smoker   . Smokeless tobacco: Never Used  . Alcohol Use: No   OB History    No data available     Review of Systems  10 systems reviewed and found to be negative, except as noted in the HPI.   Allergies  Bee venom; Eggs or egg-derived products; Other; Gadobenate; Penicillins; Latex; Oxycodone-acetaminophen; and Penicillin g  Home Medications   Prior to Admission medications   Medication Sig Start Date End Date Taking? Authorizing Provider  busPIRone (BUSPAR) 7.5 MG tablet Take  7.5 mg by mouth 2 (two) times daily.   Yes Historical Provider, MD  HYDROcodone-acetaminophen (NORCO) 10-325 MG tablet Take 1 tablet by mouth every 6 (six) hours as needed. 01/28/16  Yes Quintella Reichert, MD  naproxen sodium (ANAPROX) 220 MG tablet Take 440 mg by mouth 2 (two) times daily as needed (headache).   Yes Historical Provider, MD  tamsulosin (FLOMAX) 0.4 MG CAPS capsule Take 1 capsule (0.4 mg total) by mouth daily. 01/28/16  Yes Quintella Reichert, MD  Vitamin D, Ergocalciferol, (DRISDOL) 50000 UNITS CAPS capsule Take 1 capsule (50,000 Units total) by mouth every 7 (seven) days. Patient taking differently: Take 50,000 Units by mouth every 7 (seven) days. Monday 09/10/15  Yes Philemon Kingdom, MD  ibuprofen (ADVIL,MOTRIN) 200 MG tablet Take 400 mg by mouth every 6 (six) hours as needed for headache, mild pain or moderate pain.    Historical Provider, MD  lisinopril-hydrochlorothiazide (PRINZIDE,ZESTORETIC) 20-12.5 MG per tablet Take 1 tablet by mouth daily as needed (blood pressure).     Historical Provider, MD   BP 140/123 mmHg  Pulse 87  Temp(Src) 98.2 F (36.8 C) (Oral)  Resp 14  SpO2 99% Physical Exam  Constitutional: She is oriented to person, place, and time. She appears well-developed and well-nourished. No distress.  Obese, tearful  HENT:  Head: Normocephalic.  Mouth/Throat: Oropharynx is clear and moist.  Eyes: Conjunctivae and EOM are normal. Pupils are equal, round, and reactive to light.  Neck: Normal range of motion. No JVD present. No tracheal deviation present.  Cardiovascular: Normal rate, regular rhythm and intact distal pulses.   Radial pulse equal bilaterally  Pulmonary/Chest: Effort normal and breath sounds normal. No stridor. No respiratory distress. She has no wheezes. She has no rales. She exhibits no tenderness.  Abdominal: Soft. Bowel sounds are normal. She exhibits no distension and no mass. There is no tenderness. There is no rebound and no guarding.   Musculoskeletal: Normal range of motion. She exhibits no edema or tenderness.  No calf asymmetry, superficial collaterals, palpable cords, edema, Homans sign negative bilaterally.    Neurological: She is alert and oriented to person, place, and time.  Skin: Skin is warm. She is not diaphoretic.  Psychiatric: She has a normal mood and affect.  Nursing note and vitals reviewed.   ED Course  Procedures (including critical care time) Labs Review Labs Reviewed  BASIC METABOLIC PANEL - Abnormal; Notable for the following:    Glucose, Bld 103 (*)    Calcium 11.6 (*)    All other components within normal limits  CBC  I-STAT TROPOININ, ED    Imaging Review Dg Chest 2 View  02/02/2016  CLINICAL DATA:  45 year old female with intermittent upper chest pain, anxiety and tachycardia EXAM: CHEST  2 VIEW COMPARISON:  Prior chest x-ray 12/13/2013 FINDINGS: The lungs are clear and negative for focal airspace consolidation, pulmonary edema or suspicious pulmonary nodule. No pleural effusion or pneumothorax. Cardiac and mediastinal contours are  within normal limits. No acute fracture or lytic or blastic osseous lesions. The visualized upper abdominal bowel gas pattern is unremarkable. IMPRESSION: Negative chest x-ray Electronically Signed   By: Jacqulynn Cadet M.D.   On: 02/02/2016 23:14   I have personally reviewed and evaluated these images and lab results as part of my medical decision-making.   EKG Interpretation None      MDM   Final diagnoses:  Panic attack as reaction to stress    Filed Vitals:   02/02/16 1901 02/02/16 1905 02/02/16 2305  BP: 159/112  140/123  Pulse: 88  87  Temp: 98.2 F (36.8 C)    TempSrc: Oral    Resp: 20  14  SpO2: 100% 98% 99%    Medications  lisinopril (PRINIVIL,ZESTRIL) tablet 20 mg (20 mg Oral Given 02/02/16 2316)  hydrochlorothiazide (MICROZIDE) capsule 12.5 mg (12.5 mg Oral Given 02/02/16 2316)  LORazepam (ATIVAN) tablet 2 mg (2 mg Sublingual Given  02/02/16 2301)    Tyesha Grassel is 45 y.o. female presenting with left-sided chest pain associated with severe anxiety. No SI or HI. EKG with no arrhythmia or ischemic changes. Blood work reassuring including troponin. Patient has a history of PE but I think this is likely the cause of her pain, think it is much more likely due to her severe anxiety. Patient is low risk by heart score.  Sublingual Ativan significantly improve this patient's anxiety and her chest pain has completely resolved. Will write her a short prescription to go home and she will follow closely with PCP for further management.  Evaluation does not show pathology that would require ongoing emergent intervention or inpatient treatment. Pt is hemodynamically stable and mentating appropriately. Discussed findings and plan with patient/guardian, who agrees with care plan. All questions answered. Return precautions discussed and outpatient follow up given.   New Prescriptions   LORAZEPAM (ATIVAN) 1 MG TABLET    Take 1 tablet (1 mg total) by mouth 3 (three) times daily as needed for anxiety.         Monico Blitz, PA-C 02/03/16 0141  Jola Schmidt, MD 02/05/16 [phone]

## 2016-02-02 NOTE — ED Notes (Signed)
Patient transported to X-ray 

## 2016-02-02 NOTE — ED Notes (Signed)
Pt placed on cardiac monitoring.  

## 2016-02-02 NOTE — ED Notes (Signed)
Pt states she has been having chest pain since late last night  Pt states she was laying in bed when it started  Pt states it has been off and on for about a month worse today  Pt states the pain is on the left side and feels like tightness  Denies any other sxs  Pt has no hx of heart problems

## 2016-02-02 NOTE — ED Notes (Signed)
Per EMS- Patient reports that she has been arguing with her 45 year old son x several days. Patient c/o anxiety and chest pain. EMS EKG- NSR.

## 2016-02-03 LAB — I-STAT TROPONIN, ED: TROPONIN I, POC: 0 ng/mL (ref 0.00–0.08)

## 2016-02-03 MED ORDER — LORAZEPAM 1 MG PO TABS: 1.0000 mg | ORAL_TABLET | Freq: Three times a day (TID) | ORAL | Status: DC | PRN

## 2016-02-03 NOTE — Discharge Instructions (Signed)
Please follow with your primary care doctor in the next 2 days for a check-up. They must obtain records for further management.  ° °Do not hesitate to return to the Emergency Department for any new, worsening or concerning symptoms.  ° ° °Panic Attacks °Panic attacks are sudden, short-lived surges of severe anxiety, fear, or discomfort. They may occur for no reason when you are relaxed, when you are anxious, or when you are sleeping. Panic attacks may occur for a number of reasons:  °· Healthy people occasionally have panic attacks in extreme, life-threatening situations, such as war or natural disasters. Normal anxiety is a protective mechanism of the body that helps us react to danger (fight or flight response). °· Panic attacks are often seen with anxiety disorders, such as panic disorder, social anxiety disorder, generalized anxiety disorder, and phobias. Anxiety disorders cause excessive or uncontrollable anxiety. They may interfere with your relationships or other life activities. °· Panic attacks are sometimes seen with other mental illnesses, such as depression and posttraumatic stress disorder. °· Certain medical conditions, prescription medicines, and drugs of abuse can cause panic attacks. °SYMPTOMS  °Panic attacks start suddenly, peak within 20 minutes, and are accompanied by four or more of the following symptoms: °· Pounding heart or fast heart rate (palpitations). °· Sweating. °· Trembling or shaking. °· Shortness of breath or feeling smothered. °· Feeling choked. °· Chest pain or discomfort. °· Nausea or strange feeling in your stomach. °· Dizziness, light-headedness, or feeling like you will faint. °· Chills or hot flushes. °· Numbness or tingling in your lips or hands and feet. °· Feeling that things are not real or feeling that you are not yourself. °· Fear of losing control or going crazy. °· Fear of dying. °Some of these symptoms can mimic serious medical conditions. For example, you may think  you are having a heart attack. Although panic attacks can be very scary, they are not life threatening. °DIAGNOSIS  °Panic attacks are diagnosed through an assessment by your health care provider. Your health care provider will ask questions about your symptoms, such as where and when they occurred. Your health care provider will also ask about your medical history and use of alcohol and drugs, including prescription medicines. Your health care provider may order blood tests or other studies to rule out a serious medical condition. Your health care provider may refer you to a mental health professional for further evaluation. °TREATMENT  °· Most healthy people who have one or two panic attacks in an extreme, life-threatening situation will not require treatment. °· The treatment for panic attacks associated with anxiety disorders or other mental illness typically involves counseling with a mental health professional, medicine, or a combination of both. Your health care provider will help determine what treatment is best for you. °· Panic attacks due to physical illness usually go away with treatment of the illness. If prescription medicine is causing panic attacks, talk with your health care provider about stopping the medicine, decreasing the dose, or substituting another medicine. °· Panic attacks due to alcohol or drug abuse go away with abstinence. Some adults need professional help in order to stop drinking or using drugs. °HOME CARE INSTRUCTIONS  °· Take all medicines as directed by your health care provider.   °· Schedule and attend follow-up visits as directed by your health care provider. It is important to keep all your appointments. °SEEK MEDICAL CARE IF: °· You are not able to take your medicines as prescribed. °· Your symptoms do   not improve or get worse. SEEK IMMEDIATE MEDICAL CARE IF:   You experience panic attack symptoms that are different than your usual symptoms.  You have serious thoughts  about hurting yourself or others.  You are taking medicine for panic attacks and have a serious side effect. MAKE SURE YOU:  Understand these instructions.  Will watch your condition.  Will get help right away if you are not doing well or get worse.   This information is not intended to replace advice given to you by your health care provider. Make sure you discuss any questions you have with your health care provider.   Document Released: 12/04/2005 Document Revised: 12/09/2013 Document Reviewed: 07/18/2013 Elsevier Interactive Patient Education [employer].

## 2016-03-09 ENCOUNTER — Encounter: Payer: Self-pay | Admitting: Internal Medicine

## 2016-03-09 ENCOUNTER — Ambulatory Visit (INDEPENDENT_AMBULATORY_CARE_PROVIDER_SITE_OTHER): Payer: Medicaid Other | Admitting: Internal Medicine

## 2016-03-09 VITALS — BP 118/70 | HR 87 | Temp 97.9°F | Resp 12 | Wt 359.0 lb

## 2016-03-09 DIAGNOSIS — E559 Vitamin D deficiency, unspecified: Secondary | ICD-10-CM

## 2016-03-09 DIAGNOSIS — E213 Hyperparathyroidism, unspecified: Secondary | ICD-10-CM | POA: Diagnosis not present

## 2016-03-09 LAB — MAGNESIUM: MAGNESIUM: 2.1 mg/dL (ref 1.5–2.5)

## 2016-03-09 LAB — PHOSPHORUS: Phosphorus: 2.8 mg/dL (ref 2.3–4.6)

## 2016-03-09 NOTE — Patient Instructions (Signed)
Please stop at the lab.  Please come back for a follow-up appointment in 6 months.  

## 2016-03-09 NOTE — Progress Notes (Signed)
Patient ID: Carol Ortega, female   DOB: [birth year], 45 y.o.   MRN: [id number]  HPI  Carol Ortega is a 45 y.o.-year-old female, initially referred by Dr Tresa Moore (her urologist), now returning for follow-up for hypercalcemia, hyperparathyroidism, vitamin D def. PCP: Dr Eldridge Abrahams. Last visit 6 mo ago.  She has a history of vitamin D deficiency, which is very uncontrolled, and not responding to treatment. It is of the severity of her vitamin D deficiency, we were not able to reevaluate her parathyroid status to clearly establish a diagnosis of primary hyperparathyroidism may refer her to surgery. She continues to have episodes of kidney stones.  She passed a new stone last month. She is getting them every month.  Reviewed hx: Pt was referred for hypercalcemia during investigation of nephrolithiasis. She tells me she was found to have a high calcium level in the past by PCP.   She has had L nephrolithiasis 2x a year since 2000 >> many visits to the ED. She has had lithotripsy and then extractions. Some of the stones she passed (12 mm). She has had a stent placed, now taken out.  No h/o osteoporosis or fractures.   I reviewed pt's pertinent labs: 12/24/2014 Labs from [facility]:  - calcium 11.3 (8.4-10.5) - GFR 105 - iPTH 32 ([facility])  Lab Results  Component Value Date   PTH 165.2* 02/09/2014   CALCIUM 11.6* 02/02/2016   CALCIUM 11.2* 01/28/2016   CALCIUM 10.8* 10/11/2015   CALCIUM 10.1 05/30/2015   CALCIUM 10.6* 05/05/2015   CALCIUM 10.8* 04/22/2015   CALCIUM 10.4 04/08/2015   CALCIUM 10.5 04/05/2015   CALCIUM 10.6* 03/07/2015   CALCIUM 10.6* 08/30/2014   She has a h/o vitamin D deficiency and we had a lot of difficulty in replacing her vit D level so we can properly test her for primary hyperparathyroidism:  01/2014: vit D 14 >> started 2000 IU vit D OTC  06/2014: vit D 9.5 >> increased her vit D supplementation to 4000 units daily  12/2014: vit D 6.9  >> increased her vit D supplementation to 5000 units daily, then started ergocalciferol 50,000 units weekly x8 weeks >> then continued with 5000 IU daily.   09/10/2015: vit D 11.7 >> started ergocalciferol 50,000 units once a week, but she did not understand that she is to continue this long-term, and therefore is now on daily 5000 units OTC vitamin - for the last few months, cannot remember exactly when she stopped the high-dose vitamin D  No h/o CKD. Last BUN/Cr: Lab Results  Component Value Date   BUN 10 02/02/2016   CREATININE 0.81 02/02/2016   She also has a history of left sphenoid wing meningioma >> had 32 days of radiation tx. She also has a h/o HTN and was on Lisinopril-HCTZ, but not recently. She is on Lisinopril only now.  I reviewed pt's medications, allergies, PMH, social hx, family hx, and changes were documented in the history of present illness. Otherwise, unchanged from my initial visit note.  ROS: Constitutional: + weight Gain, no fatigue, + hot flushes Eyes: no blurry vision, no xerophthalmia ENT: no sore throat, no nodules palpated in throat, no dysphagia/odynophagia,+ hoarseness Cardiovascular: no CP/SOB/palpitations/+ leg swelling Respiratory: no cough/SOB Gastrointestinal: no N/V/C/no heartburn Musculoskeletal: no muscle/joint aches Skin: no rashes Neurological: no tremors/numbness/tingling/dizziness, no HA  PE: BP 118/70 mmHg  Pulse 87  Temp(Src) 97.9 F (36.6 C) (Oral)  Resp 12  Wt 359 lb (162.841 kg)  SpO2 95% Body mass  index is 56.21 kg/(m^2). Wt Readings from Last 3 Encounters:  03/09/16 359 lb (162.841 kg)  11/06/15 320 lb (145.151 kg)  09/10/15 362 lb (164.202 kg)   Constitutional: obese class 3, in NAD. Eyes: PERRLA, EOMI, no exophthalmos ENT: moist mucous membranes, no thyromegaly, no cervical lymphadenopathy Cardiovascular: RRR, No MRG Respiratory: CTA B Gastrointestinal: abdomen soft, NT, ND, BS+ Musculoskeletal: no deformities, strength  intact in all 4 Skin: moist, warm, no rashes Neurological: no tremor with outstretched hands, DTR normal in all 4  Assessment: 1. Hypercalcemia/hyperparathyroidism  Records from [facility] (Dr Tresa Moore) - 01/29/2014: Patient with history of: - left nephrolithiasis - underwent SWL 2015 [place], subsequently presenting with refractory colic, and bacteriuria in [place] and managed with a stent 01/13/2014, followed by definitive ureteroscopy for large left UPJ stone - bacteriuria UTI - most recent positive culture with Escherichia coli 12/2013. Most recent urine culture on 01/25/2014, negative. - Investigation for nephrolithiasis: Uric acid 5.4 (2.4-7) BMP normal, except glucose 102 and calcium 10.7 (8.4-10.5); her GFR was normal at >89. PTH 190.8 (14-72)  Component     Latest Ref Rng 02/09/2014  Vitamin D 1, 25 (OH) Total     18 - 72 pg/mL 129 (H)  Vitamin D3 1, 25 (OH)      100  Vitamin D2 1, 25 (OH)      29  PTH     14.0 - 72.0 pg/mL 165.2 (H)  Calcium     8.4 - 10.5 mg/dL 10.4  Vit D, 25-Hydroxy     30 - 89 ng/mL 14 (L)  Phosphorus     2.3 - 4.6 mg/dL 2.9   01/2014:   - vit D 14 (some time after a course or Ergocalciferol) 06/2014: - Magnesium 2.0 - vitamin D 9.5, prev. 12, 4 mo ago - TSH 2.54 - BMP: normal, except:  Aphos 131 (32-98), prev. 96, 4 mo ago AST 14 (15-41) Ca 11.2  12/24/2014 Labs from [facility]:  - calcium 11.3 (8.4-10.5) - GFR 105 - iPTH 32 ([facility])  - vitamin D 25 HO 6.9 (30-100) - lower than before!  Vitamin D lower than before. iPTH not high, but higher than expected for the 11.3 calcium. 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  2. Vit D def - see above  Plan: 1. And 2. Patient has a h/o elevated calcium, with the highest level being at 11.6. PTH levels also high, but decreased at last check in  12/2014. As mentioned above, patient has a very low Vit D and we have been unsuccessful in increasing it to close to normal range. At last visit, we started ergocalciferol 50,000 units weekly, and she has now transitioned to over-the-counter vitamin D 3 50,000 units daily. We have to be careful with vitamin D replacement 2/2 her h/o nephrolithiasis. Due to the very low vitamin D level, I could not retest her parathyroid hormone or check the rest of the labs to investigate for primary hyperparathyroidism. She continues to have kidney stones that happens very frequently. She also developed hydronephrosis at one point. We again discussed discussed about other measures to prevent calcium stones including drinking plenty of water and trying to make her urine as alkaline is possible. I suggested that she eats a lot of fruit and vegetables.   - It is very likely that she does have a parathyroid  adenoma based on the high PTH level with a borderline/high calcium, however, we need to recheck the tests when vit D normal. I can refer her to surgery when her vitamin D normalizes, but we discussed that her vitamin D needs to be close to normal at the time of the surgery, otherwise, she would be at risk for hypocalcemia postop. - However, due to the severity of her kidney stones, I would like to check a technetium sestamibi scan and, if positive, I would probably refer her to surgery but she will need to have close observation after surgery due to the possibility of hypocalcemia in the setting of low vitamin D. Will order the Tc sestamibi scan after results are back. - She does meet criteria for parathyroid surgery if she turns out to have primary hyperparathyroidism Increased calcium by more than 1 mg/dL above the upper limit of normal  Kidney ds.  Osteoporosis  Age <10 years old Return in about 6 months (around 09/09/2016).  Component     Latest Ref Rng 03/09/2016 03/09/2016        11:18 AM 11:18 AM  Calcium     8.7  - 10.2 mg/dL  10.8 (H)  PTH     15 - 65 pg/mL  110 (H)  Phosphorus     2.3 - 4.6 mg/dL 2.8   VITD     30.00 - 100.00 ng/mL 19.93 (L)   Magnesium     1.5 - 2.5 mg/dL 2.1    Vitamin D improved! We'll continue 5000 units daily of vitamin D3 for now. Magnesium, phosphorus normal. Calcium and PTH high. Will await the result of her technetium sestamibi scan, and then referred to surgery, since I have high suspicion for primary hyperparathyroidism.  I will addend the results when they become available.

## 2016-03-10 LAB — PTH, INTACT AND CALCIUM
Calcium: 10.8 mg/dL — ABNORMAL HIGH (ref 8.7–10.2)
PTH: 110 pg/mL — ABNORMAL HIGH (ref 15–65)

## 2016-03-10 LAB — VITAMIN D 25 HYDROXY (VIT D DEFICIENCY, FRACTURES): VITD: 19.93 ng/mL — ABNORMAL LOW (ref 30.00–100.00)

## 2016-03-12 LAB — VITAMIN D 1,25 DIHYDROXY
VITAMIN D 1, 25 (OH) TOTAL: 124 pg/mL — AB (ref 18–72)
Vitamin D2 1, 25 (OH)2: 77 pg/mL
Vitamin D3 1, 25 (OH)2: 47 pg/mL

## 2016-03-30 ENCOUNTER — Encounter (HOSPITAL_COMMUNITY): Payer: Medicaid Other

## 2016-04-20 IMAGING — CR DG CHEST 2V
2 series · 2 of 2 positions shown · non-contrast
Comparison: Prior chest x-ray 12/13/2013

CLINICAL DATA: 44-year-old female with intermittent upper chest
pain, anxiety and tachycardia

EXAM:
CHEST  2 VIEW

[w chest pa]
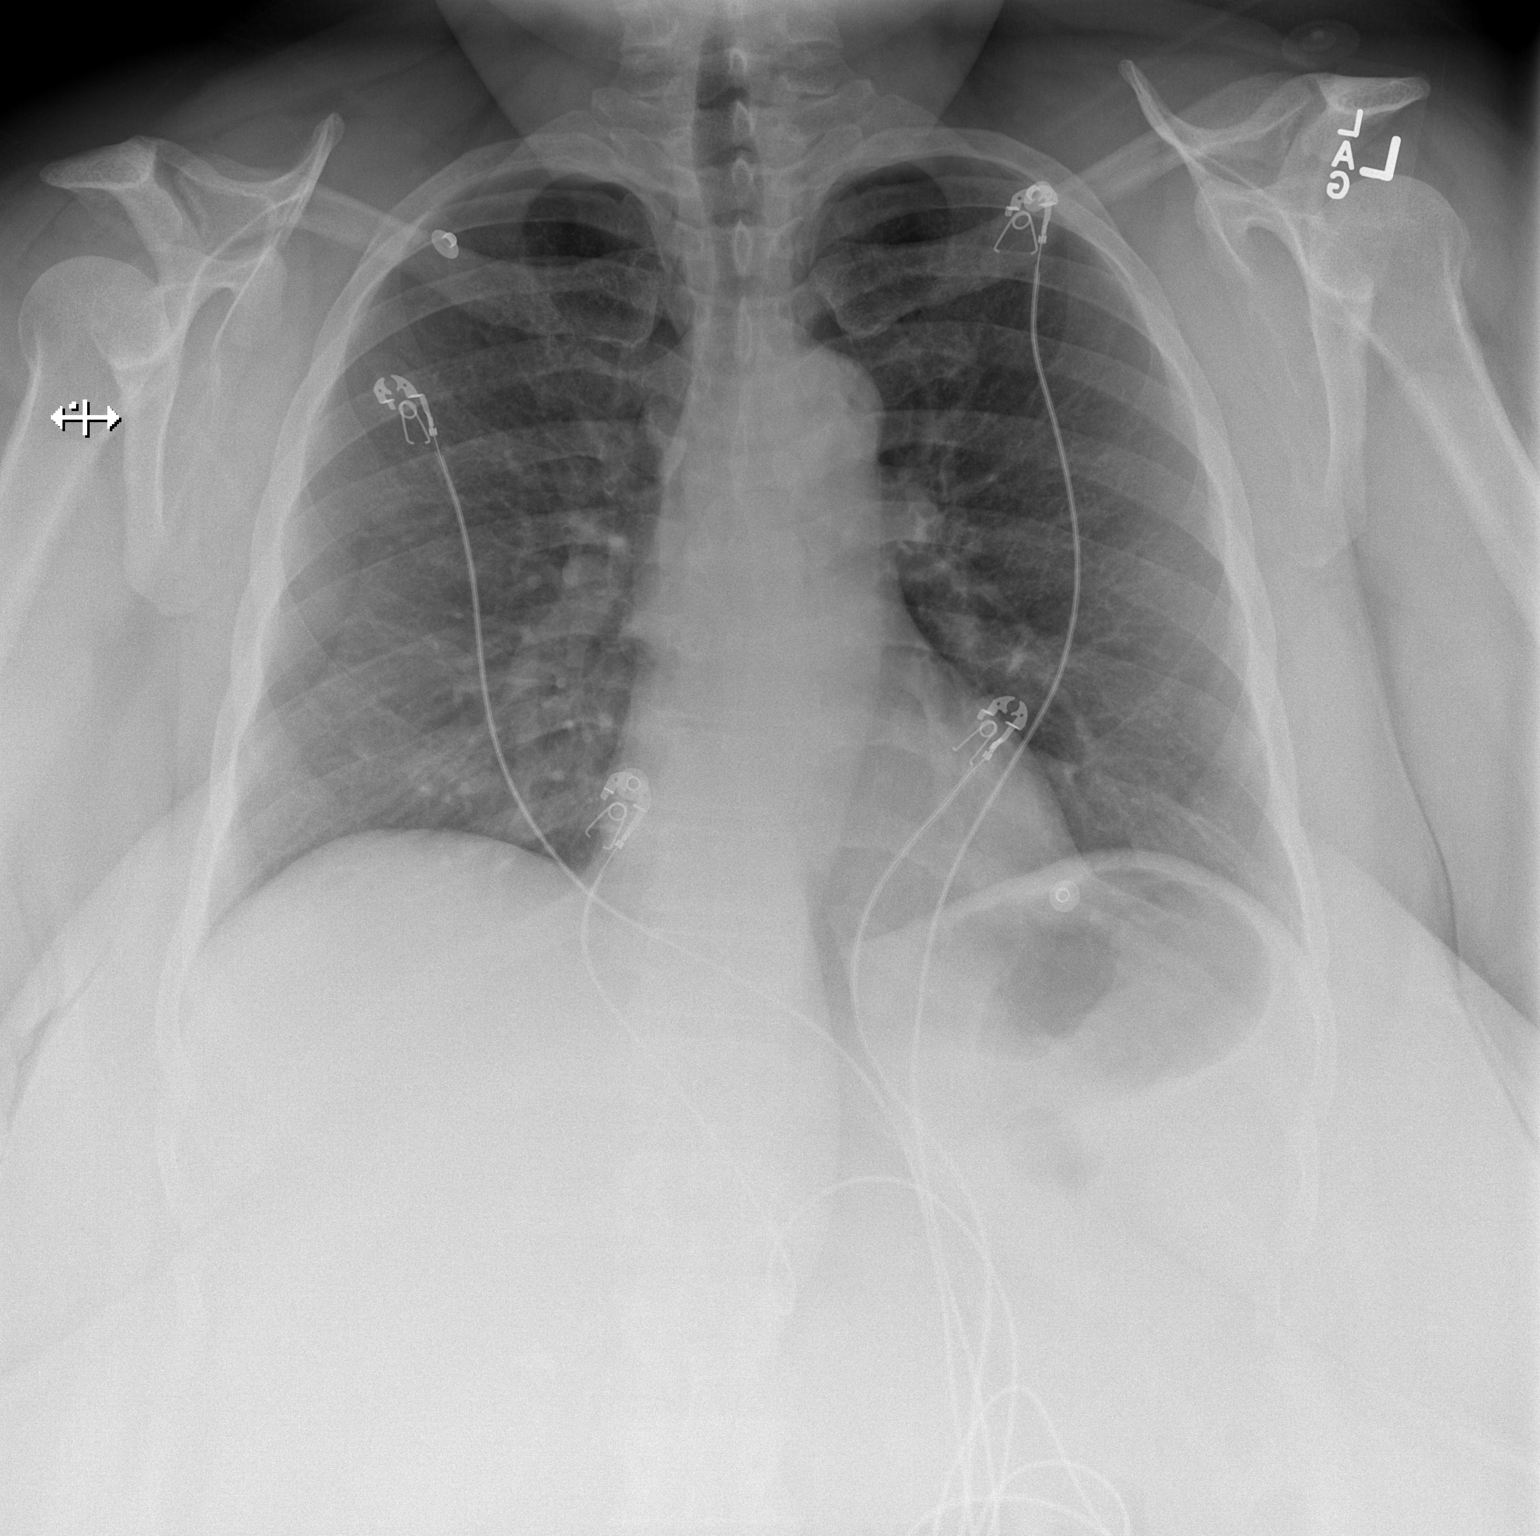

[w chest lat]
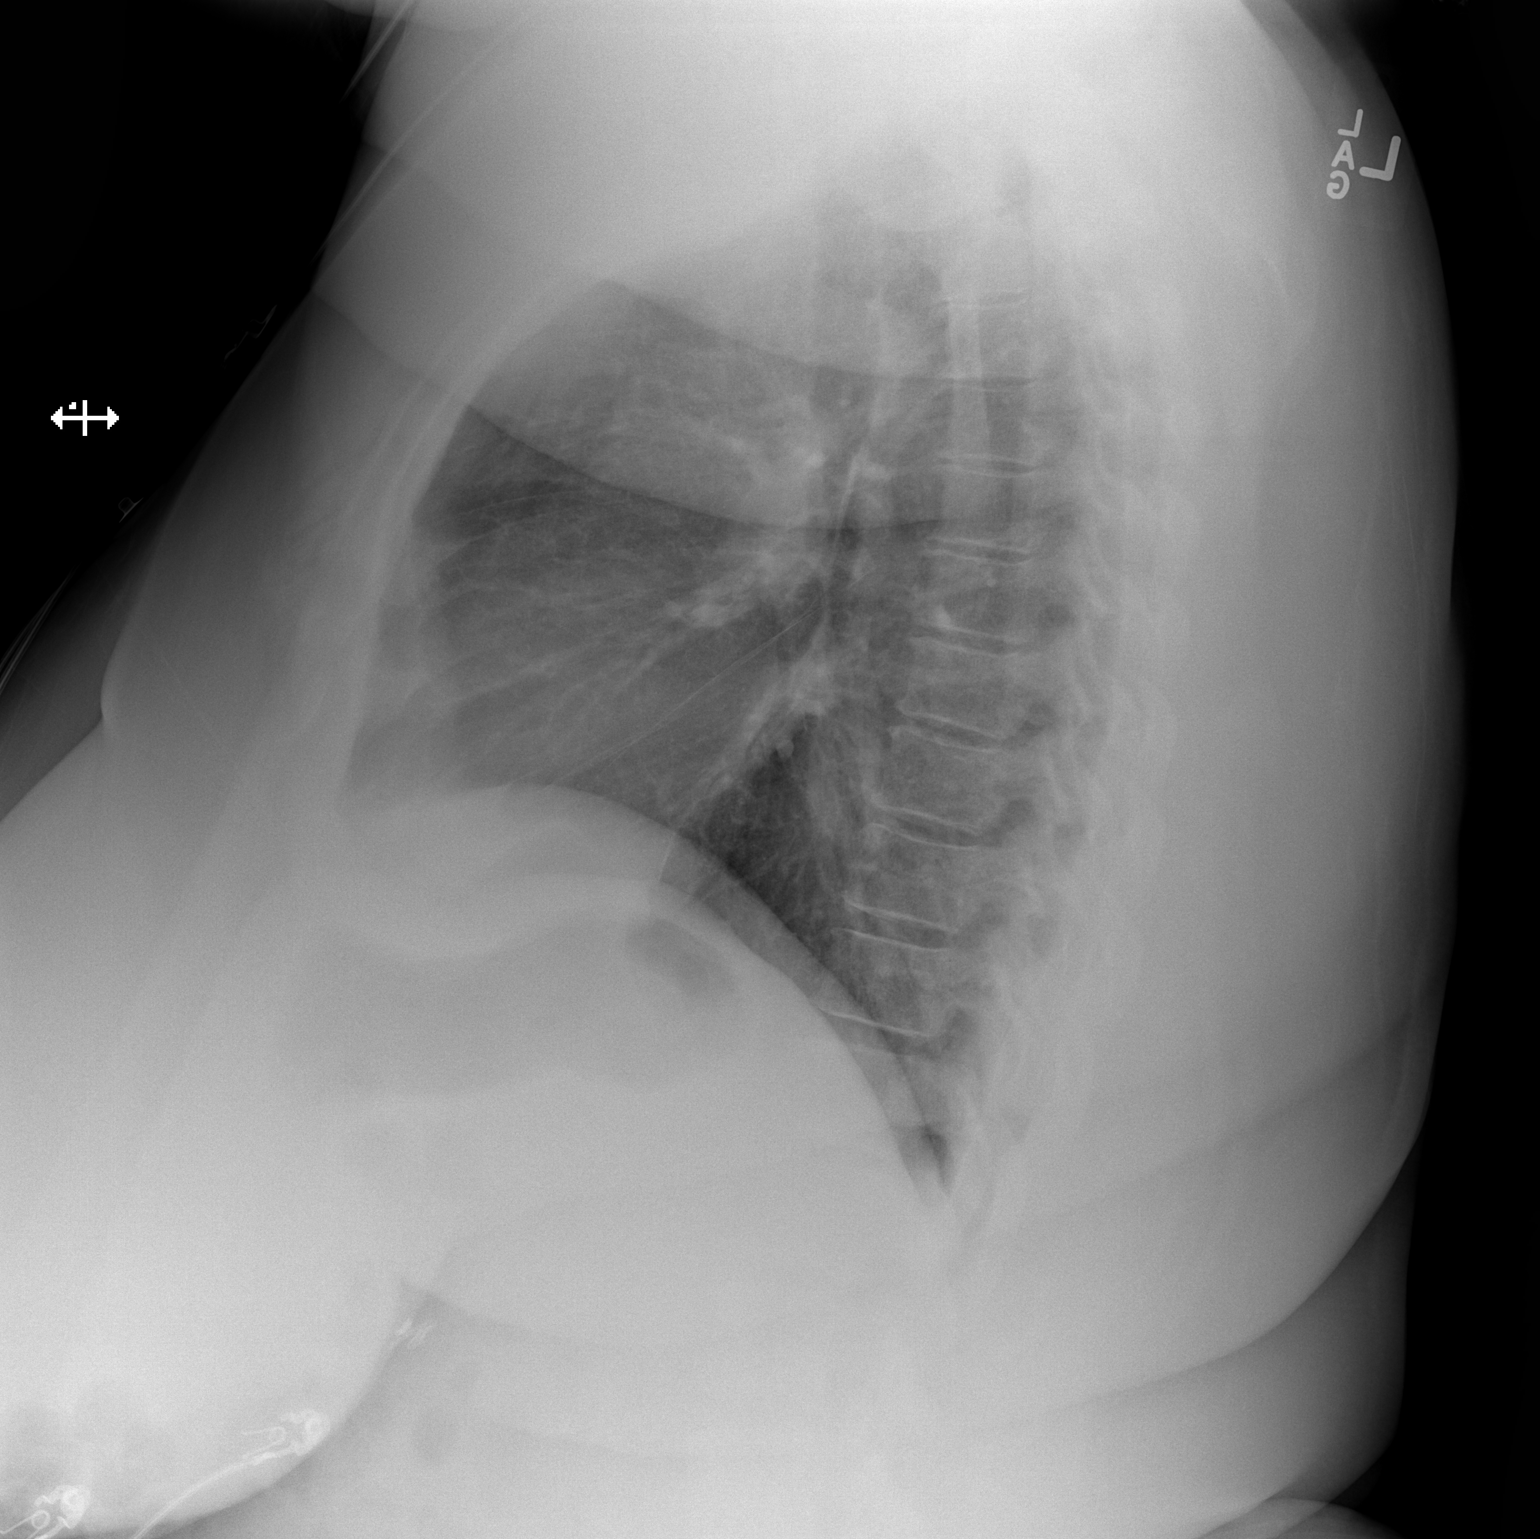

[2 of 2 positions shown; findings below may reference images not displayed]

FINDINGS: The lungs are clear and negative for focal airspace consolidation,
pulmonary edema or suspicious pulmonary nodule. No pleural effusion
or pneumothorax. Cardiac and mediastinal contours are within normal
limits. No acute fracture or lytic or blastic osseous lesions. The
visualized upper abdominal bowel gas pattern is unremarkable.
IMPRESSION: Negative chest x-ray

## 2016-04-21 ENCOUNTER — Encounter (HOSPITAL_COMMUNITY): Payer: Self-pay | Admitting: *Deleted

## 2016-04-21 ENCOUNTER — Emergency Department (HOSPITAL_COMMUNITY): Payer: Medicaid Other

## 2016-04-21 ENCOUNTER — Emergency Department (HOSPITAL_COMMUNITY)
Admission: EM | Admit: 2016-04-21 | Discharge: 2016-04-22 | Disposition: A | Discharge status: Home / Self Care | Payer: Medicaid Other | Attending: Emergency Medicine | Admitting: Emergency Medicine

## 2016-04-21 DIAGNOSIS — F329 Major depressive disorder, single episode, unspecified: Secondary | ICD-10-CM | POA: Insufficient documentation

## 2016-04-21 DIAGNOSIS — E669 Obesity, unspecified: Secondary | ICD-10-CM | POA: Diagnosis not present

## 2016-04-21 DIAGNOSIS — Z79891 Long term (current) use of opiate analgesic: Secondary | ICD-10-CM | POA: Diagnosis not present

## 2016-04-21 DIAGNOSIS — R0602 Shortness of breath: Secondary | ICD-10-CM | POA: Diagnosis present

## 2016-04-21 DIAGNOSIS — Z79899 Other long term (current) drug therapy: Secondary | ICD-10-CM | POA: Diagnosis not present

## 2016-04-21 DIAGNOSIS — R079 Chest pain, unspecified: Secondary | ICD-10-CM

## 2016-04-21 DIAGNOSIS — I10 Essential (primary) hypertension: Secondary | ICD-10-CM | POA: Diagnosis not present

## 2016-04-21 DIAGNOSIS — R0789 Other chest pain: Secondary | ICD-10-CM | POA: Diagnosis not present

## 2016-04-21 DIAGNOSIS — J45909 Unspecified asthma, uncomplicated: Secondary | ICD-10-CM | POA: Insufficient documentation

## 2016-04-21 LAB — CBC
HEMATOCRIT: 42.7 % (ref 36.0–46.0)
Hemoglobin: 13.9 g/dL (ref 12.0–15.0)
MCH: 27.6 pg (ref 26.0–34.0)
MCHC: 32.6 g/dL (ref 30.0–36.0)
MCV: 84.7 fL (ref 78.0–100.0)
Platelets: 346 10*3/uL (ref 150–400)
RBC: 5.04 MIL/uL (ref 3.87–5.11)
RDW: 14.8 % (ref 11.5–15.5)
WBC: 7.3 10*3/uL (ref 4.0–10.5)

## 2016-04-21 LAB — I-STAT TROPONIN, ED: Troponin i, poc: 0 ng/mL (ref 0.00–0.08)

## 2016-04-21 NOTE — ED Notes (Signed)
Pt reports onset of shortness of breath, mild chest pain, lightheadedness, and dry cough since about 4pm, after working out. Pt reports hx of PE.

## 2016-04-22 LAB — BASIC METABOLIC PANEL
ANION GAP: 10 (ref 5–15)
BUN: 10 mg/dL (ref 6–20)
CALCIUM: 11.5 mg/dL — AB (ref 8.9–10.3)
CO2: 23 mmol/L (ref 22–32)
Chloride: 105 mmol/L (ref 101–111)
Creatinine, Ser: 0.82 mg/dL (ref 0.44–1.00)
GLUCOSE: 103 mg/dL — AB (ref 65–99)
POTASSIUM: 3.6 mmol/L (ref 3.5–5.1)
Sodium: 138 mmol/L (ref 135–145)

## 2016-04-22 LAB — D-DIMER, QUANTITATIVE (NOT AT ARMC)

## 2016-04-22 MED ORDER — ACETAMINOPHEN 325 MG PO TABS
650.0000 mg | ORAL_TABLET | Freq: Once | ORAL | Status: AC
  Administered 2016-04-22: 650 mg via ORAL
  Filled 2016-04-22: qty 2

## 2016-04-22 NOTE — Discharge Instructions (Signed)
Nonspecific Chest Pain  °Chest pain can be caused by many different conditions. There is always a chance that your pain could be related to something serious, such as a heart attack or a blood clot in your lungs. Chest pain can also be caused by conditions that are not life-threatening. If you have chest pain, it is very important to follow up with your health care provider. °CAUSES  °Chest pain can be caused by: °· Heartburn. °· Pneumonia or bronchitis. °· Anxiety or stress. °· Inflammation around your heart (pericarditis) or lung (pleuritis or pleurisy). °· A blood clot in your lung. °· A collapsed lung (pneumothorax). It can develop suddenly on its own (spontaneous pneumothorax) or from trauma to the chest. °· Shingles infection (varicella-zoster virus). °· Heart attack. °· Damage to the bones, muscles, and cartilage that make up your chest wall. This can include: °¨ Bruised bones due to injury. °¨ Strained muscles or cartilage due to frequent or repeated coughing or overwork. °¨ Fracture to one or more ribs. °¨ Sore cartilage due to inflammation (costochondritis). °RISK FACTORS  °Risk factors for chest pain may include: °· Activities that increase your risk for trauma or injury to your chest. °· Respiratory infections or conditions that cause frequent coughing. °· Medical conditions or overeating that can cause heartburn. °· Heart disease or family history of heart disease. °· Conditions or health behaviors that increase your risk of developing a blood clot. °· Having had chicken pox (varicella zoster). °SIGNS AND SYMPTOMS °Chest pain can feel like: °· Burning or tingling on the surface of your chest or deep in your chest. °· Crushing, pressure, aching, or squeezing pain. °· Dull or sharp pain that is worse when you move, cough, or take a deep breath. °· Pain that is also felt in your back, neck, shoulder, or arm, or pain that spreads to any of these areas. °Your chest pain may come and go, or it may stay  constant. °DIAGNOSIS °Lab tests or other studies may be needed to find the cause of your pain. Your health care provider may have you take a test called an ambulatory ECG (electrocardiogram). An ECG records your heartbeat patterns at the time the test is performed. You may also have other tests, such as: °· Transthoracic echocardiogram (TTE). During echocardiography, sound waves are used to create a picture of all of the heart structures and to look at how blood flows through your heart. °· Transesophageal echocardiogram (TEE). This is a more advanced imaging test that obtains images from inside your body. It allows your health care provider to see your heart in finer detail. °· Cardiac monitoring. This allows your health care provider to monitor your heart rate and rhythm in real time. °· Holter monitor. This is a portable device that records your heartbeat and can help to diagnose abnormal heartbeats. It allows your health care provider to track your heart activity for several days, if needed. °· Stress tests. These can be done through exercise or by taking medicine that makes your heart beat more quickly. °· Blood tests. °· Imaging tests. °TREATMENT  °Your treatment depends on what is causing your chest pain. Treatment may include: °· Medicines. These may include: °¨ Acid blockers for heartburn. °¨ Anti-inflammatory medicine. °¨ Pain medicine for inflammatory conditions. °¨ Antibiotic medicine, if an infection is present. °¨ Medicines to dissolve blood clots. °¨ Medicines to treat coronary artery disease. °· Supportive care for conditions that do not require medicines. This may include: °¨ Resting. °¨ Applying heat   or cold packs to injured areas. °¨ Limiting activities until pain decreases. °HOME CARE INSTRUCTIONS °· If you were prescribed an antibiotic medicine, finish it all even if you start to feel better. °· Avoid any activities that bring on chest pain. °· Do not use any tobacco products, including  cigarettes, chewing tobacco, or electronic cigarettes. If you need help quitting, ask your health care provider. °· Do not drink alcohol. °· Take medicines only as directed by your health care provider. °· Keep all follow-up visits as directed by your health care provider. This is important. This includes any further testing if your chest pain does not go away. °· If heartburn is the cause for your chest pain, you may be told to keep your head raised (elevated) while sleeping. This reduces the chance that acid will go from your stomach into your esophagus. °· Make lifestyle changes as directed by your health care provider. These may include: °¨ Getting regular exercise. Ask your health care provider to suggest some activities that are safe for you. °¨ Eating a heart-healthy diet. A registered dietitian can help you to learn healthy eating options. °¨ Maintaining a healthy weight. °¨ Managing diabetes, if necessary. °¨ Reducing stress. °SEEK MEDICAL CARE IF: °· Your chest pain does not go away after treatment. °· You have a rash with blisters on your chest. °· You have a fever. °SEEK IMMEDIATE MEDICAL CARE IF:  °· Your chest pain is worse. °· You have an increasing cough, or you cough up blood. °· You have severe abdominal pain. °· You have severe weakness. °· You faint. °· You have chills. °· You have sudden, unexplained chest discomfort. °· You have sudden, unexplained discomfort in your arms, back, neck, or jaw. °· You have shortness of breath at any time. °· You suddenly start to sweat, or your skin gets clammy. °· You feel nauseous or you vomit. °· You suddenly feel light-headed or dizzy. °· Your heart begins to beat quickly, or it feels like it is skipping beats. °These symptoms may represent a serious problem that is an emergency. Do not wait to see if the symptoms will go away. Get medical help right away. Call your local emergency services (911 in the [place] Do not drive yourself to the hospital. °  °This  information is not intended to replace advice given to you by your health care provider. Make sure you discuss any questions you have with your health care provider. °  °Document Released: 09/13/2005 Document Revised: 12/25/2014 Document Reviewed: 07/10/2014 °Elsevier Interactive Patient Education ©2016 [employer] ° °

## 2016-04-22 NOTE — ED Provider Notes (Addendum)
CSN: [id number]     Arrival date & time 04/21/16  2257 History  By signing my name below, I, Carol Ortega, attest that this documentation has been prepared under the direction and in the presence of Dorie Rank, MD. Electronically Signed: Judithann Ortega, ED Scribe. 04/22/2016. 12:59 AM.    Chief Complaint  Patient presents with  . Shortness of Breath   Patient is a 45 y.o. female presenting with shortness of breath. The history is provided by the patient. No language interpreter was used.  Shortness of Breath Severity:  Moderate Onset quality:  Gradual Timing:  Intermittent Progression:  Improving Chronicity:  New Context: activity   Relieved by:  None tried Worsened by:  Nothing tried Ineffective treatments:  None tried Associated symptoms: cough   Associated symptoms: no fever, no sputum production and no vomiting   Risk factors: hx of cancer, hx of PE/DVT and obesity    HPI Comments: Carol Ortega is a 45 y.o. female with a hx of HTN and PE who presents to the Emergency Department complaining of gradually worsening non-productive cough onset 4 pm yesterday. Pt reports associated SOB. She explains that she worked out today prior to onset of her symptoms. No alleviating factors noted. Pt has not tried any medications PTA. She reports a hx of PE in 2011. She denies any leg swelling, CP, or n/v.   Past Medical History  Diagnosis Date  . Kidney stone   . UTI (urinary tract infection)   . Obesity   . Brain tumor ([place]) 2014    tx with radiation  . Radiation     Brain tumor  . Hypertension   . Pulmonary emboli ([place]) 2011  . Parathyroid abnormality ([place])   . Vitamin D deficiency   . Complication of anesthesia 1999    lung collapse after surgery for gallbladder  . Asthma     seasonal  . Headache     migraines  . Depression   . Anxiety    Past Surgical History  Procedure Laterality Date  . Kidney stone removal    . Lithotripsy    . Cystoscopy w/ ureteroscopy    .  Cystoscopy with stent placement Left 01/12/2014    Procedure: CYSTOSCOPY WITH STENT PLACEMENT left retrograde;  Surgeon: Irine Seal, MD;  Location: [facility];  Service: Urology;  Laterality: Left;  . Surgery for,ectopic pregnancy  2011     1 fallopian tube rupture  . Tubal ligation  2011  . Cholecystectomy  1999  . Cystoscopy with retrograde pyelogram, ureteroscopy and stent placement Left 01/20/2014    Procedure: LEFT URETEROSCOPY WITH STENT PLACEMENT;  Surgeon: Irine Seal, MD;  Location: [facility];  Service: Urology;  Laterality: Left;  . Holmium laser application Left [id number]    Procedure: HOLMIUM LASER APPLICATION;  Surgeon: Irine Seal, MD;  Location: [facility];  Service: Urology;  Laterality: Left;  . Cystoscopy with retrograde pyelogram, ureteroscopy and stent placement Bilateral 04/23/2015    Procedure: CYSTOSCOPY WITH BILATERAL RETROGRADE PYELOGRAM, URETEROSCOPY AND STENT PLACEMENT;  Surgeon: Alexis Frock, MD;  Location: [facility];  Service: Urology;  Laterality: Bilateral;  . Holmium laser application Bilateral [id number]    Procedure: HOLMIUM LASER APPLICATION;  Surgeon: Alexis Frock, MD;  Location: [facility];  Service: Urology;  Laterality: Bilateral;   Family History  Problem Relation Age of Onset  . Bell's palsy Mother   . Heart failure Mother   . Asthma Father   . Hypertension Father    Social History  Substance  Use Topics  . Smoking status: Never Smoker   . Smokeless tobacco: Never Used  . Alcohol Use: No   OB History    No data available     Review of Systems  Constitutional: Negative for fever and chills.  Respiratory: Positive for cough and shortness of breath. Negative for sputum production.   Cardiovascular: Negative for leg swelling.  Gastrointestinal: Negative for nausea and vomiting.      Allergies  Bee venom; Eggs or egg-derived products; Other; Gadobenate; Penicillins; Latex; Oxycodone-acetaminophen; and Penicillin g  Home Medications   Prior to Admission  medications   Medication Sig Start Date End Date Taking? Authorizing Provider  Cholecalciferol (VITAMIN D3) 5000 units CAPS Take 5,000 Units by mouth daily.   Yes Historical Provider, MD  ibuprofen (ADVIL,MOTRIN) 200 MG tablet Take 400 mg by mouth every 6 (six) hours as needed for headache, mild pain or moderate pain.   Yes Historical Provider, MD  lisinopril-hydrochlorothiazide (PRINZIDE,ZESTORETIC) 20-12.5 MG per tablet Take 1 tablet by mouth daily as needed (blood pressure).    Yes Historical Provider, MD  phentermine (ADIPEX-P) 37.5 MG tablet Take 37.5 mg by mouth daily before breakfast.   Yes Historical Provider, MD  HYDROcodone-acetaminophen (NORCO) 10-325 MG tablet Take 1 tablet by mouth every 6 (six) hours as needed. Patient not taking: Reported on 04/22/2016 01/28/16   Quintella Reichert, MD  LORazepam (ATIVAN) 1 MG tablet Take 1 tablet (1 mg total) by mouth 3 (three) times daily as needed for anxiety. Patient not taking: Reported on 04/22/2016 02/03/16   Elmyra Ricks Pisciotta, PA-C  tamsulosin (FLOMAX) 0.4 MG CAPS capsule Take 1 capsule (0.4 mg total) by mouth daily. Patient not taking: Reported on 04/22/2016 01/28/16   Quintella Reichert, MD   BP 112/73 mmHg  Pulse 82  Temp(Src) 98.3 F (36.8 C) (Oral)  Resp 23  Ht 5\' 6"  (1.676 m)  Wt 154.223 kg  BMI 54.90 kg/m2  SpO2 100% Physical Exam  Constitutional: She appears well-developed and well-nourished. No distress.  Overweight  HENT:  Head: Normocephalic and atraumatic.  Right Ear: External ear normal.  Left Ear: External ear normal.  Eyes: Conjunctivae are normal. Right eye exhibits no discharge. Left eye exhibits no discharge. No scleral icterus.  Neck: Neck supple. No tracheal deviation present.  Cardiovascular: Normal rate, regular rhythm and intact distal pulses.   Pulmonary/Chest: Effort normal and breath sounds normal. No stridor. No respiratory distress. She has no wheezes. She has no rales.  Abdominal: Soft. Bowel sounds are normal. She  exhibits no distension. There is no tenderness. There is no rebound and no guarding.  Musculoskeletal: She exhibits no edema or tenderness.  Neurological: She is alert. She has normal strength. No cranial nerve deficit (no facial droop, extraocular movements intact, no slurred speech) or sensory deficit. She exhibits normal muscle tone. She displays no seizure activity. Coordination normal.  Skin: Skin is warm and dry. No rash noted.  Psychiatric: She has a normal mood and affect.  Nursing note and vitals reviewed.   ED Course  Procedures (including critical care time) DIAGNOSTIC STUDIES: Oxygen Saturation is 98% on RA, normal by my interpretation.    COORDINATION OF CARE: 12:56 AM- Pt advised of plan for treatment and pt agrees. Pt informed of her lab results and x-ray. She will receive a d-dimer for further evaluation.    Labs Review Labs Reviewed  BASIC METABOLIC PANEL - Abnormal; Notable for the following:    Glucose, Bld 103 (*)    Calcium 11.5 (*)  All other components within normal limits  CBC  D-DIMER, QUANTITATIVE (NOT AT [facility])  Randolm Idol, ED    Imaging Review Dg Chest 2 View  04/21/2016  CLINICAL DATA:  Sudden onset of dyspnea and mild chest pain at 16:00. EXAM: CHEST  2 VIEW COMPARISON:  02/02/2016 FINDINGS: The lungs are clear. The pulmonary vasculature is normal. Heart size is normal. Hilar and mediastinal contours are unremarkable. There is no pleural effusion. IMPRESSION: No active cardiopulmonary disease. Electronically Signed   By: Andreas Newport M.D.   On: 04/21/2016 23:56   Dorie Rank, MD has personally reviewed and evaluated these images and lab results as part of his medical decision-making.   EKG Interpretation   Date/Time:  Friday Apr 21 2016 23:29:12 EDT Ventricular Rate:  94 PR Interval:  181 QRS Duration: 89 QT Interval:  338 QTC Calculation: 423 R Axis:   45 Text Interpretation:  Sinus rhythm Low voltage, precordial leads Baseline   wander in lead(s) V1 No significant change since last tracing Confirmed by  Langford Carias  MD-J, Korry Dalgleish [id number]) on 04/21/2016 11:36:58 PM      MDM   Final diagnoses:  Chest pain with low risk for cardiac etiology   Pt presents to the ED with chest pain.  Sx are atypical for cardiac disease.   Low risk for ACS.  Troponin is normal and ECG is normal.  Pt has had a PE in the past.  No tachycardia or tachypnea.  D dimer negative.  Doubt PE.  Stable for discharge.  I personally performed the services described in this documentation, which was scribed in my presence.  The recorded information has been reviewed and is accurate.    Dorie Rank, MD 04/22/16 [phone]

## 2016-06-13 ENCOUNTER — Encounter (HOSPITAL_COMMUNITY): Payer: Self-pay | Admitting: Emergency Medicine

## 2016-06-13 ENCOUNTER — Emergency Department (HOSPITAL_COMMUNITY)
Admission: EM | Admit: 2016-06-13 | Discharge: 2016-06-14 | Disposition: A | Discharge status: Home / Self Care | Payer: Medicaid Other | Attending: Emergency Medicine | Admitting: Emergency Medicine

## 2016-06-13 ENCOUNTER — Emergency Department (HOSPITAL_COMMUNITY): Payer: Medicaid Other

## 2016-06-13 DIAGNOSIS — Z85841 Personal history of malignant neoplasm of brain: Secondary | ICD-10-CM | POA: Insufficient documentation

## 2016-06-13 DIAGNOSIS — Z86718 Personal history of other venous thrombosis and embolism: Secondary | ICD-10-CM | POA: Diagnosis not present

## 2016-06-13 DIAGNOSIS — Z791 Long term (current) use of non-steroidal anti-inflammatories (NSAID): Secondary | ICD-10-CM | POA: Diagnosis not present

## 2016-06-13 DIAGNOSIS — J45901 Unspecified asthma with (acute) exacerbation: Secondary | ICD-10-CM | POA: Insufficient documentation

## 2016-06-13 DIAGNOSIS — Z79899 Other long term (current) drug therapy: Secondary | ICD-10-CM | POA: Insufficient documentation

## 2016-06-13 DIAGNOSIS — I10 Essential (primary) hypertension: Secondary | ICD-10-CM | POA: Insufficient documentation

## 2016-06-13 DIAGNOSIS — F329 Major depressive disorder, single episode, unspecified: Secondary | ICD-10-CM | POA: Diagnosis not present

## 2016-06-13 MED ORDER — ALBUTEROL SULFATE (2.5 MG/3ML) 0.083% IN NEBU
5.0000 mg | INHALATION_SOLUTION | Freq: Once | RESPIRATORY_TRACT | Status: AC
  Administered 2016-06-13: 5 mg via RESPIRATORY_TRACT
  Filled 2016-06-13: qty 6

## 2016-06-13 MED ORDER — PREDNISONE 20 MG PO TABS
60.0000 mg | ORAL_TABLET | Freq: Once | ORAL | Status: AC
  Administered 2016-06-13: 60 mg via ORAL
  Filled 2016-06-13: qty 3

## 2016-06-13 MED ORDER — IPRATROPIUM BROMIDE 0.02 % IN SOLN
0.5000 mg | Freq: Once | RESPIRATORY_TRACT | Status: AC
  Administered 2016-06-13: 0.5 mg via RESPIRATORY_TRACT
  Filled 2016-06-13: qty 2.5

## 2016-06-13 MED ORDER — PREDNISONE 20 MG PO TABS: 60.0000 mg | ORAL_TABLET | Freq: Every day | ORAL | Status: DC

## 2016-06-13 MED ORDER — METHYLPREDNISOLONE SODIUM SUCC 125 MG IJ SOLR: 125.0000 mg | Freq: Once | INTRAMUSCULAR | Status: DC

## 2016-06-13 MED ORDER — IPRATROPIUM-ALBUTEROL 0.5-2.5 (3) MG/3ML IN SOLN
3.0000 mL | Freq: Once | RESPIRATORY_TRACT | Status: AC
  Administered 2016-06-13: 3 mL via RESPIRATORY_TRACT
  Filled 2016-06-13: qty 3

## 2016-06-13 NOTE — ED Notes (Signed)
Bed: [id number] Expected date:  Expected time:  Means of arrival:  Comments: EMS lacerations to leg and arm

## 2016-06-13 NOTE — ED Provider Notes (Signed)
CSN: [id number]     Arrival date & time 06/13/16  2104 History   First MD Initiated Contact with Patient 06/13/16 2111     Chief Complaint  Patient presents with  . Asthma     (Consider location/radiation/quality/duration/timing/severity/associated sxs/prior Treatment) HPI   Carol Ortega is a 45 y/o with a PMHx of brain tumor s/p radiation in 2014, PE in 2011 after surgery, asthma, depression, and anxiety presenting with cough and wheezing x 2 days.  She started having cough, wheezing, and SOB on Sunday. Her albuterol inhaler would improve her symptoms intermittently until she got up to ambulate. Today, she started having SOB at rest. She used her albuterol inhaler 6-7x yesterday and then again today. She has not gotten any improvement with her treatments today. Cough is non-productive. She also notes associated chest tightness that began today as well. She notes the chest tightness is centrally located. She denies any radiation of the chest tightness. She denies any diaphoresis or lightheadedness with the chest tightness. No fever, chills, otalgias, sneezing, watery eyes, HA, facial pains.  She was hoarse last week and she thought it may be secondary to allergies, it resolved on Thursday or Friday. She denies any other symptoms with her hoarseness.   She denies any recent surgeries, no long trips, no malgancies.  She denies any lower extremity swelling or pain.    Past Medical History  Diagnosis Date  . Kidney stone   . UTI (urinary tract infection)   . Obesity   . Brain tumor ([place]) 2014    tx with radiation  . Radiation     Brain tumor  . Hypertension   . Pulmonary emboli ([place]) 2011  . Parathyroid abnormality ([place])   . Vitamin D deficiency   . Complication of anesthesia 1999    lung collapse after surgery for gallbladder  . Asthma     seasonal  . Headache     migraines  . Depression   . Anxiety    Past Surgical History  Procedure Laterality Date  . Kidney stone removal     . Lithotripsy    . Cystoscopy w/ ureteroscopy    . Cystoscopy with stent placement Left 01/12/2014    Procedure: CYSTOSCOPY WITH STENT PLACEMENT left retrograde;  Surgeon: Irine Seal, MD;  Location: [facility];  Service: Urology;  Laterality: Left;  . Surgery for,ectopic pregnancy  2011     1 fallopian tube rupture  . Tubal ligation  2011  . Cholecystectomy  1999  . Cystoscopy with retrograde pyelogram, ureteroscopy and stent placement Left 01/20/2014    Procedure: LEFT URETEROSCOPY WITH STENT PLACEMENT;  Surgeon: Irine Seal, MD;  Location: [facility];  Service: Urology;  Laterality: Left;  . Holmium laser application Left [id number]    Procedure: HOLMIUM LASER APPLICATION;  Surgeon: Irine Seal, MD;  Location: [facility];  Service: Urology;  Laterality: Left;  . Cystoscopy with retrograde pyelogram, ureteroscopy and stent placement Bilateral 04/23/2015    Procedure: CYSTOSCOPY WITH BILATERAL RETROGRADE PYELOGRAM, URETEROSCOPY AND STENT PLACEMENT;  Surgeon: Alexis Frock, MD;  Location: [facility];  Service: Urology;  Laterality: Bilateral;  . Holmium laser application Bilateral [id number]    Procedure: HOLMIUM LASER APPLICATION;  Surgeon: Alexis Frock, MD;  Location: [facility];  Service: Urology;  Laterality: Bilateral;   Family History  Problem Relation Age of Onset  . Bell's palsy Mother   . Heart failure Mother   . Asthma Father   . Hypertension Father    Social History  Substance  Use Topics  . Smoking status: Never Smoker   . Smokeless tobacco: Never Used  . Alcohol Use: No   OB History    No data available     Review of Systems  Constitutional: Negative for fever, chills, diaphoresis and appetite change.  HENT: Negative for congestion, ear discharge, ear pain, facial swelling, mouth sores, nosebleeds, postnasal drip, rhinorrhea, sinus pressure, sneezing, sore throat and tinnitus.   Eyes: Negative for photophobia, discharge, redness and visual disturbance.  Respiratory: Positive for cough, chest  tightness, shortness of breath and wheezing. Negative for choking and stridor.   Cardiovascular: Negative for chest pain, palpitations and leg swelling.  Gastrointestinal: Negative for nausea, vomiting, abdominal pain, diarrhea and constipation.  Endocrine: Negative.   Genitourinary: Negative.   Musculoskeletal: Negative.   Skin: Negative for rash.  Allergic/Immunologic: Positive for environmental allergies and food allergies.  Neurological: Negative for dizziness, tremors, syncope, weakness, light-headedness and headaches.  Hematological: Negative.   Psychiatric/Behavioral: Negative.       Allergies  Bee venom; Eggs or egg-derived products; Other; Gadobenate; Penicillins; Latex; Oxycodone-acetaminophen; and Penicillin g  Home Medications   Prior to Admission medications   Medication Sig Start Date End Date Taking? Authorizing Provider  Cholecalciferol (VITAMIN D3) 5000 units CAPS Take 5,000 Units by mouth daily.   Yes Historical Provider, MD  ibuprofen (ADVIL,MOTRIN) 200 MG tablet Take 400 mg by mouth every 6 (six) hours as needed for headache, mild pain or moderate pain.   Yes Historical Provider, MD  lisinopril-hydrochlorothiazide (PRINZIDE,ZESTORETIC) 20-12.5 MG per tablet Take 1 tablet by mouth daily as needed (blood pressure).    Yes Historical Provider, MD  phentermine (ADIPEX-P) 37.5 MG tablet Take 37.5 mg by mouth daily before breakfast.   Yes Historical Provider, MD  PROVENTIL HFA 108 (90 Base) MCG/ACT inhaler Inhale 2 puffs into the lungs every 6 (six) hours as needed for wheezing or shortness of breath.  06/12/16  Yes Historical Provider, MD  predniSONE (DELTASONE) 20 MG tablet Take 3 tablets (60 mg total) by mouth daily with breakfast. 06/13/16   Archie Patten, MD   BP 132/94 mmHg  Pulse 110  Temp(Src) 98.3 F (36.8 C) (Oral)  Resp 24  SpO2 98% Physical Exam  Constitutional: She is oriented to person, place, and time. She appears well-developed and well-nourished. No  distress.  HENT:  Head: Normocephalic.  Nose: Nose normal.  Mouth/Throat: Oropharynx is clear and moist. No oropharyngeal exudate.  Eyes: Conjunctivae are normal. Right eye exhibits no discharge. Left eye exhibits no discharge. No scleral icterus.  Neck: Normal range of motion. Neck supple. No JVD present.  Cardiovascular: Regular rhythm, normal heart sounds and intact distal pulses.  Tachycardia present.  Exam reveals no gallop and no friction rub.   No murmur heard. Pulmonary/Chest: Effort normal. No respiratory distress. She has wheezes. She has no rales. She exhibits no tenderness.  Faint expiratory wheeze in the upper lobes bilaterally  Abdominal: Soft. Bowel sounds are normal. She exhibits no distension. There is no tenderness. There is no rebound and no guarding.  Musculoskeletal: Normal range of motion. She exhibits no edema or tenderness.  Neurological: She is alert and oriented to person, place, and time. She displays normal reflexes. She exhibits normal muscle tone.  Skin: Skin is warm. No rash noted. She is not diaphoretic.  Psychiatric: She has a normal mood and affect.    ED Course  Procedures (including critical care time)  2145: patient in the process of an albuterol treatment. Expiratory wheeze  noted on exam. Will give a dose of prednisone 60mg  and Atrovent treatment. CXR ordered.  2240: CXR without acute abnormality. Finished Atrovent neb about 5 minutes ago. Cough resolved. Still with some SOB but improved. Will give another Duoneb and re-evaluate.  2345: Patient feeling much better. No SOB. Able to ambulate without dyspnea. Patient with faint expiratory wheeze on exam still.   Labs Review Labs Reviewed - No data to display  Imaging Review Dg Chest 2 View  06/13/2016  CLINICAL DATA:  Dyspnea and wheezing. Nonproductive cough. Three days duration. EXAM: CHEST  2 VIEW COMPARISON:  04/21/2016 FINDINGS: The heart size and mediastinal contours are within normal limits.  Both lungs are clear. The visualized skeletal structures are unremarkable. IMPRESSION: No active cardiopulmonary disease. Electronically Signed   By: Andreas Newport M.D.   On: 06/13/2016 22:30   I have personally reviewed and evaluated these images and lab results as part of my medical decision-making.   MDM   Final diagnoses:  Asthma exacerbation    Carol Ortega is a 45 year old female with past medical history of asthma With dyspnea, wheezing, and cough for the last 2 days. On exam, there is no increased work of breathing and she is satting 98%  on room air. There is a faint expiratory wheeze on exam. She was given given nebs 2 and is of prednisone 60 mg orally with improvement in her symptoms. She has a history of a PE in the past in the setting of surgery and is tachycardiac on presentation, however her tachycardia is most likely secondary to the albuterol. Her well's score is 3 for tachycardia and a previous diagnosis of PE. She's not been immobilized, had any recent surgeries, have any malignancies, have any unilateral lower extremity swelling, or an oxygen requirement therefore PE very unlikely. No evidence of infection on exam.  The patient is stable for discharge. She'll be given prednisone 60 mg daily for the next 4 days. She was advised to use albuterol every 4 hours while awake for the next 24 hours, then as needed. She is advised follow up with her PCP. Strict return precautions were discussed.    Archie Patten, MD 06/14/16 [id number]  Blanchie Dessert, MD 06/14/16 1313

## 2016-06-13 NOTE — ED Notes (Signed)
Pt states that she has had coughing and wheezing x 2 days. Denies recent URI. Alert and oriented.

## 2016-06-13 NOTE — Discharge Instructions (Signed)
Asthma, Adult Asthma is a condition of the lungs in which the airways tighten and narrow. Asthma can make it hard to breathe. Asthma cannot be cured, but medicine and lifestyle changes can help control it. Asthma may be started (triggered) by:  Animal skin flakes (dander).  Dust.  Cockroaches.  Pollen.  Mold.  Smoke.  Cleaning products.  Hair sprays or aerosol sprays.  Paint fumes or strong smells.  Cold air, weather changes, and winds.  Crying or laughing hard.  Stress.  Certain medicines or drugs.  Foods, such as dried fruit, potato chips, and sparkling grape juice.  Infections or conditions (colds, flu).  Exercise.  Certain medical conditions or diseases.  Exercise or tiring activities. HOME CARE   Take medicine as told by your doctor.  Use a peak flow meter as told by your doctor. A peak flow meter is a tool that measures how well the lungs are working.  Record and keep track of the peak flow meter's readings.  Understand and use the asthma action plan. An asthma action plan is a written plan for taking care of your asthma and treating your attacks.  To help prevent asthma attacks:  Do not smoke. Stay away from secondhand smoke.  Change your heating and air conditioning filter often.  Limit your use of fireplaces and wood stoves.  Get rid of pests (such as roaches and mice) and their droppings.  Throw away plants if you see mold on them.  Clean your floors. Dust regularly. Use cleaning products that do not smell.  Have someone vacuum when you are not home. Use a vacuum cleaner with a HEPA filter if possible.  Replace carpet with wood, tile, or vinyl flooring. Carpet can trap animal skin flakes and dust.  Use allergy-proof pillows, mattress covers, and box spring covers.  Wash bed sheets and blankets every week in hot water and dry them in a dryer.  Use blankets that are made of polyester or cotton.  Clean bathrooms and kitchens with bleach.  If possible, have someone repaint the walls in these rooms with mold-resistant paint. Keep out of the rooms that are being cleaned and painted.  Wash hands often. GET HELP IF:  You have make a whistling sound when breaking (wheeze), have shortness of breath, or have a cough even if taking medicine to prevent attacks.  The colored mucus you cough up (sputum) is thicker than usual.  The colored mucus you cough up changes from clear or white to yellow, green, gray, or bloody.  You have problems from the medicine you are taking such as:  A rash.  Itching.  Swelling.  Trouble breathing.  You need reliever medicines more than 2-3 times a week.  Your peak flow measurement is still at 50-79% of your personal best after following the action plan for 1 hour.  You have a fever. GET HELP RIGHT AWAY IF:   You seem to be worse and are not responding to medicine during an asthma attack.  You are short of breath even at rest.  You get short of breath when doing very little activity.  You have trouble eating, drinking, or talking.  You have chest pain.  You have a fast heartbeat.  Your lips or fingernails start to turn blue.  You are light-headed, dizzy, or faint.  Your peak flow is less than 50% of your personal best.   This information is not intended to replace advice given to you by your health care provider. Make sure   you discuss any questions you have with your health care provider.   Document Released: 05/22/2008 Document Revised: 08/25/2015 Document Reviewed: 07/03/2013 Elsevier Interactive Patient Education 2016 [employer]  

## 2016-07-10 ENCOUNTER — Telehealth: Payer: Self-pay

## 2016-07-10 ENCOUNTER — Telehealth: Payer: Self-pay | Admitting: Internal Medicine

## 2016-07-10 NOTE — Telephone Encounter (Signed)
Called and left message for patient to call back with information for

## 2016-07-10 NOTE — Telephone Encounter (Signed)
Patient is returning your call.  

## 2016-07-10 NOTE — Telephone Encounter (Signed)
PT wants to know about some kind of procedure that she was supposed to be scheduled for, she stated she missed the first part and wasn't sure what she needed to do to proceed.

## 2016-08-28 DIAGNOSIS — Z86711 Personal history of pulmonary embolism: Secondary | ICD-10-CM | POA: Insufficient documentation

## 2016-08-28 DIAGNOSIS — Z9049 Acquired absence of other specified parts of digestive tract: Secondary | ICD-10-CM | POA: Insufficient documentation

## 2016-08-28 DIAGNOSIS — Z8759 Personal history of other complications of pregnancy, childbirth and the puerperium: Secondary | ICD-10-CM

## 2016-09-08 ENCOUNTER — Ambulatory Visit (INDEPENDENT_AMBULATORY_CARE_PROVIDER_SITE_OTHER): Payer: Medicaid Other | Admitting: Internal Medicine

## 2016-09-08 ENCOUNTER — Other Ambulatory Visit: Payer: Self-pay | Admitting: General Surgery

## 2016-09-08 ENCOUNTER — Encounter: Payer: Self-pay | Admitting: Internal Medicine

## 2016-09-08 VITALS — BP 142/94 | HR 73 | Wt 337.0 lb

## 2016-09-08 DIAGNOSIS — E213 Hyperparathyroidism, unspecified: Secondary | ICD-10-CM | POA: Diagnosis not present

## 2016-09-08 DIAGNOSIS — E559 Vitamin D deficiency, unspecified: Secondary | ICD-10-CM | POA: Diagnosis not present

## 2016-09-08 LAB — VITAMIN D 25 HYDROXY (VIT D DEFICIENCY, FRACTURES): VITD: 11.43 ng/mL — ABNORMAL LOW (ref 30.00–100.00)

## 2016-09-08 NOTE — Patient Instructions (Addendum)
Please stop at the lab.  Please continue vitamin D 5000 units daily.  Please come back for a follow-up appointment in 4 months.

## 2016-09-08 NOTE — Progress Notes (Addendum)
Patient ID: Carol Ortega, female   DOB: 1971/09/01, 45 y.o.   MRN: [id number]  HPI  Carol Ortega is a 46 y.o.-year-old female, initially referred by Dr Tresa Moore (her urologist), now returning for follow-up for hypercalcemia, hyperparathyroidism, vitamin D def. PCP: Dr Eldridge Abrahams. Last visit 6 mo ago.  She has a history of vitamin D deficiency, which is very uncontrolled, and not well responding to treatment. Due to the severity of her vitamin D deficiency, we were not able to reevaluate her parathyroid status to clearly establish a diagnosis of primary hyperparathyroidism may refer her to surgery. Last kidney stone in 04/2016.  At last visit, after the vitamin D increased to 19.9, I referred her for a technetium sestamibi parathyroid scan. This was not done yet...  Reviewed  and addended hx: Pt was referred for hypercalcemia during investigation of nephrolithiasis. She tells me she was found to have a high calcium level in the past by PCP.   She has had L nephrolithiasis 2x a year since 2000 >> many visits to the ED. She has had lithotripsy and then extractions. Some of the stones she passed (12 mm). She has had a stent placed, now taken out.  No h/o osteoporosis or fractures.   I reviewed pt's pertinent labs: 12/24/2014 Labs from [facility]:  - calcium 11.3 (8.4-10.5) - GFR 105 - iPTH 32 ([facility])  Lab Results  Component Value Date   PTH 110 (H) 03/09/2016   PTH Comment 03/09/2016   PTH 165.2 (H) 02/09/2014   CALCIUM 11.5 (H) 04/21/2016   CALCIUM 10.8 (H) 03/09/2016   CALCIUM 11.6 (H) 02/02/2016   CALCIUM 11.2 (H) 01/28/2016   CALCIUM 10.8 (H) 10/11/2015   CALCIUM 10.1 05/30/2015   CALCIUM 10.6 (H) 05/05/2015   CALCIUM 10.8 (H) 04/22/2015   CALCIUM 10.4 04/08/2015   CALCIUM 10.5 04/05/2015   She has a h/o vitamin D deficiency and we had a lot of difficulty in replacing her vit D level. Vit D 1,25 dihydroxy was high, c/w hyperparathyroidism:  Component      Latest Ref Rng & Units 02/09/2014  Vitamin D 1, 25 (OH) Total     18 - 72 pg/mL 129 (H)  Vitamin D3 1, 25 (OH)     pg/mL 100  Vitamin D2 1, 25 (OH)     pg/mL 29   Component     Latest Ref Rng & Units 03/09/2016  Vitamin D 1, 25 (OH) Total     18 - 72 pg/mL 124 (H)  Vitamin D3 1, 25 (OH)     pg/mL 47  Vitamin D2 1, 25 (OH)     pg/mL 77    01/2014: vit D 14 >> started 2000 IU vit D OTC  06/2014: vit D 9.5 >> increased her vit D supplementation to 4000 units daily  12/2014: vit D 6.9 >> increased her vit D supplementation to 5000 units daily, then started ergocalciferol 50,000 units weekly x8 weeks >> then continued with 5000 IU daily.   09/10/2015: vit D 11.7 >> started ergocalciferol 50,000 units once a week, but she did not understand that she is to continue this long-term, and therefore is now on daily 5000 units OTC vitamin - for the last few months, cannot remember exactly when she stopped the high-dose vitamin D  03/09/2016: Vitamin D 19.93 >> we continued 5000 units vitamin D daily  Component     Latest Ref Rng 03/09/2016  Phosphorus     2.3 - 4.6  mg/dL 2.8  Magnesium     1.5 - 2.5 mg/dL 2.1   No h/o CKD. Last BUN/Cr: Lab Results  Component Value Date   BUN 10 04/21/2016   CREATININE 0.82 04/21/2016   She also has a history of left sphenoid wing meningioma >> had 32 days of radiation tx. She also has a h/o HTN and was on Lisinopril-HCTZ, but not recently. She is on Lisinopril only now.  I reviewed pt's medications, allergies, PMH, social hx, family hx, and changes were documented in the history of present illness. Otherwise, unchanged from my initial visit note.  ROS: Constitutional: + weight loss, no fatigue Eyes: no blurry vision, no xerophthalmia ENT: no sore throat, no nodules palpated in throat, no dysphagia/odynophagia,+ hoarseness Cardiovascular: no CP/SOB/palpitations/leg swelling Respiratory: no cough/SOB Gastrointestinal: no N/V/C/no  heartburn Musculoskeletal: no muscle/joint aches Skin: no rashes Neurological: no tremors/numbness/tingling/dizziness, no HA  PE: BP (!) 142/94 (BP Location: Left Arm, Patient Position: Sitting)   Pulse 73   Wt (!) 337 lb (152.9 kg)   SpO2 95%   BMI 54.39 kg/m  Body mass index is 54.39 kg/m. Wt Readings from Last 3 Encounters:  09/08/16 (!) 337 lb (152.9 kg)  04/21/16 (!) 340 lb (154.2 kg)  03/09/16 (!) 359 lb (162.8 kg)   Constitutional: obese class 3, in NAD. Eyes: PERRLA, EOMI, no exophthalmos ENT: moist mucous membranes, no thyromegaly, no cervical lymphadenopathy Cardiovascular: RRR, No MRG Respiratory: CTA B Gastrointestinal: abdomen soft, NT, ND, BS+ Musculoskeletal: no deformities, strength intact in all 4 Skin: moist, warm, no rashes Neurological: no tremor with outstretched hands, DTR normal in all 4  Assessment: 1. Hypercalcemia/hyperparathyroidism  Records from [facility] (Dr Tresa Moore) - 01/29/2014: Patient with history of: - left nephrolithiasis - underwent SWL 2015 [place], subsequently presenting with refractory colic, and bacteriuria in [place] and managed with a stent 01/13/2014, followed by definitive ureteroscopy for large left UPJ stone - bacteriuria UTI - most recent positive culture with Escherichia coli 12/2013. Most recent urine culture on 01/25/2014, negative. - Investigation for nephrolithiasis: Uric acid 5.4 (2.4-7) BMP normal, except glucose 102 and calcium 10.7 (8.4-10.5); her GFR was normal at >89. PTH 190.8 (14-72)  Component     Latest Ref Rng 02/09/2014  Vitamin D 1, 25 (OH) Total     18 - 72 pg/mL 129 (H)  Vitamin D3 1, 25 (OH)      100  Vitamin D2 1, 25 (OH)      29  PTH     14.0 - 72.0 pg/mL 165.2 (H)  Calcium     8.4 - 10.5 mg/dL 10.4  Vit D, 25-Hydroxy     30 - 89 ng/mL 14 (L)  Phosphorus     2.3 - 4.6 mg/dL 2.9   01/2014:   - vit D 14 (some time after a course or Ergocalciferol) 06/2014: - Magnesium 2.0 - vitamin D  9.5, prev. 12, 4 mo ago - TSH 2.54 - BMP: normal, except:  Aphos 131 (32-98), prev. 96, 4 mo ago AST 14 (15-41) Ca 11.2  12/24/2014 Labs from [facility]:  - calcium 11.3 (8.4-10.5) - GFR 105 - iPTH 32 ([facility])  - vitamin D 25 HO 6.9 (30-100) - lower than before!  2. Vit D def - see above  Plan: 1. And 2. Patient has a h/o elevated calcium, with the highest level being at 11.6. PTH levels are also high. Patient also has a very low Vit D and we have been trying  to replete her vit D, but this turned out to be a very slow process. She was on Ergocalciferol 50,000 units weekly, but we got better results with over-the-counter vitamin D 3 50,000 units daily. We have to be careful with vitamin D replacement 2/2 her h/o nephrolithiasis. Due to the very low vitamin D level, we could not check a UCa or send her to surgery. She continues to have kidney stones, but not in last 4 mo. She did develop hydronephrosis at one point. We again discussed discussed about other measures to prevent calcium stones including drinking plenty of water and trying to make her urine as alkaline is possible. I suggested that she eats a lot of fruit and vegetables.   - It is very likely that she does have a parathyroid adenoma based on the high calcium, PTH level, and vit 1,25 vitamin D, but we discussed that her vitamin D needs to be close to normal at the time of the surgery, otherwise, she would be at risk for hypocalcemia postop. - However, due to the severity of her kidney stones, I would like to check a technetium sestamibi scan and, if positive, I would probably refer her to surgery but she will need to have close observation after surgery due to the possibility of hypocalcemia in the setting of low vitamin D. Will order the Tc sestamibi scan after results are back. - She does meet criteria for parathyroid surgery if she turns out to have primary hyperparathyroidism Increased calcium by more than 1  mg/dL above the upper limit of normal  Kidney ds.  Osteoporosis  Age <30 years old - will check a vitamin D level today Return in about 4 months (around 01/08/2017).  Orders Placed This Encounter  Procedures  . NM Parathyroid W/Spect  . Vitamin D, 25-hydroxy   Office Visit on 09/08/2016  Component Date Value Ref Range Status  . VITD 09/08/2016 11.43* 30.00 - 100.00 ng/mL Final  Vitamin D level is again very low. I am not sure if she is missing any doses of the vitamin D, but I think this is likely, since at last visit, on the same dose, the level was close to 20. I am very reticent to increase the vitamin D further because of the risk of worsening hypercalcemia. Will await her sestamibi scan, which is scheduled for 09/20/2016.NM Parathyroid W/Spect  Order: [id number]  Status:  Final result Visible to patient:  No (Not Released) Dx:  Hyperparathyroidism [facility])  Details   Reading Physician Reading Date Result Priority  Lavonia Dana, MD 09/20/2016   Narrative    CLINICAL DATA: Hyperparathyroidism, elevated calcium and parathormone levels, with kidney stones  EXAM: NM PARATHYROID SCINTIGRAPHY AND SPECT IMAGING  TECHNIQUE: Following intravenous administration of radiopharmaceutical, early and 2-hour delayed planar images were obtained in the anterior projection. Delayed triplanar SPECT images were also obtained at 2 hours.  RADIOPHARMACEUTICALS: 27.1 mCi Tc-50m Sestamibi IV  COMPARISON: None  FINDINGS: Planar images at 15 minutes and at 2 hours demonstrate normal washout of tracer from the thyroid lobes.  No definite focal abnormal sestamibi retention is seen to suggest parathyroid adenoma by planar imaging.  SPECT imaging was performed at 2 hours.  Images demonstrate a focus of subtle increased tracer localization at the expected position of the RIGHT superior parathyroid gland.  Parathyroid adenoma not excluded at this site.  No abnormal sestamibi retention is seen at  the sites of the remaining parathyroid glands.  IMPRESSION: Abnormal SPECT exam demonstrating a subtle focus  of increased tracer localization at the expected position of the superior RIGHT parathyroid gland, cannot exclude a parathyroid adenoma.   Electronically Signed By: Lavonia Dana M.D. 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 Will advise the patient to continue with 5000 units of vitamin D daily. At this point, I would like to refer her to surgery since we have tried for several years to improve her vitamin D without consistent results and she continues to have recurrent nephrolithiasis along with elevated calcium and parathyroid hormone.  Philemon Kingdom, MD PhD [facility]

## 2016-09-14 ENCOUNTER — Ambulatory Visit
Admission: RE | Admit: 2016-09-14 | Discharge: 2016-09-14 | Disposition: A | Discharge status: Home / Self Care | Payer: Medicaid Other | Source: Ambulatory Visit | Attending: General Surgery | Admitting: General Surgery

## 2016-09-20 ENCOUNTER — Ambulatory Visit (HOSPITAL_COMMUNITY)
Admission: RE | Admit: 2016-09-20 | Discharge: 2016-09-20 | Disposition: A | Discharge status: Home / Self Care | Payer: Medicaid Other | Source: Ambulatory Visit | Attending: Internal Medicine | Admitting: Internal Medicine

## 2016-09-20 ENCOUNTER — Encounter (HOSPITAL_COMMUNITY)
Admission: RE | Admit: 2016-09-20 | Discharge: 2016-09-20 | Disposition: A | Discharge status: Home / Self Care | Payer: Medicaid Other | Source: Ambulatory Visit | Attending: Internal Medicine | Admitting: Internal Medicine

## 2016-09-20 DIAGNOSIS — R948 Abnormal results of function studies of other organs and systems: Secondary | ICD-10-CM | POA: Diagnosis not present

## 2016-09-20 DIAGNOSIS — E213 Hyperparathyroidism, unspecified: Secondary | ICD-10-CM | POA: Diagnosis not present

## 2016-09-20 MED ORDER — TECHNETIUM TC 99M SESTAMIBI GENERIC - CARDIOLITE
27.1000 | Freq: Once | INTRAVENOUS | Status: AC | PRN
  Administered 2016-09-20: 27.1 via INTRAVENOUS

## 2016-09-22 ENCOUNTER — Telehealth: Payer: Self-pay

## 2016-09-22 NOTE — Telephone Encounter (Signed)
Called and spoke with patient about scan and vitamin d messages on mychart. Patient states she will get on and look at those, she was no aware. Patient advised to call back if any questions or if she was unable to get into mychart. She stated she would. No other questions at this time.

## 2016-09-22 NOTE — Addendum Note (Signed)
Addended by: Philemon Kingdom on: 09/22/2016 08:09 AM   Modules accepted: Orders

## 2016-09-25 ENCOUNTER — Other Ambulatory Visit: Payer: Self-pay | Admitting: Surgery

## 2016-09-25 DIAGNOSIS — E213 Hyperparathyroidism, unspecified: Secondary | ICD-10-CM

## 2016-09-29 ENCOUNTER — Ambulatory Visit
Admission: RE | Admit: 2016-09-29 | Discharge: 2016-09-29 | Disposition: A | Discharge status: Home / Self Care | Payer: Medicaid Other | Source: Ambulatory Visit | Attending: Surgery | Admitting: Surgery

## 2016-09-29 DIAGNOSIS — E213 Hyperparathyroidism, unspecified: Secondary | ICD-10-CM

## 2016-10-25 ENCOUNTER — Ambulatory Visit: Payer: Self-pay | Admitting: Surgery

## 2016-11-08 ENCOUNTER — Encounter (HOSPITAL_COMMUNITY)
Admission: RE | Admit: 2016-11-08 | Discharge: 2016-11-08 | Disposition: A | Discharge status: Home / Self Care | Payer: Medicaid Other | Source: Ambulatory Visit | Attending: Surgery | Admitting: Surgery

## 2016-11-08 ENCOUNTER — Ambulatory Visit (HOSPITAL_COMMUNITY)
Admission: RE | Admit: 2016-11-08 | Discharge: 2016-11-08 | Disposition: A | Discharge status: Home / Self Care | Payer: Medicaid Other | Source: Ambulatory Visit | Attending: Surgery | Admitting: Surgery

## 2016-11-08 ENCOUNTER — Encounter (HOSPITAL_COMMUNITY): Payer: Self-pay | Admitting: *Deleted

## 2016-11-08 DIAGNOSIS — E039 Hypothyroidism, unspecified: Secondary | ICD-10-CM | POA: Diagnosis not present

## 2016-11-08 DIAGNOSIS — Z01810 Encounter for preprocedural cardiovascular examination: Secondary | ICD-10-CM | POA: Insufficient documentation

## 2016-11-08 DIAGNOSIS — Z01812 Encounter for preprocedural laboratory examination: Secondary | ICD-10-CM | POA: Diagnosis present

## 2016-11-08 HISTORY — DX: Dysphonia: R49.0

## 2016-11-08 HISTORY — DX: Malignant (primary) neoplasm, unspecified: C80.1

## 2016-11-08 HISTORY — DX: Anemia, unspecified: D64.9

## 2016-11-08 LAB — BASIC METABOLIC PANEL
Anion gap: 5 (ref 5–15)
BUN: 8 mg/dL (ref 6–20)
CHLORIDE: 108 mmol/L (ref 101–111)
CO2: 27 mmol/L (ref 22–32)
CREATININE: 0.91 mg/dL (ref 0.44–1.00)
Calcium: 11 mg/dL — ABNORMAL HIGH (ref 8.9–10.3)
GFR calc Af Amer: >60 mL/min (ref >60)
GFR calc non Af Amer: >60 mL/min (ref >60)
Glucose, Bld: 102 mg/dL — ABNORMAL HIGH (ref 65–99)
POTASSIUM: 4.1 mmol/L (ref 3.5–5.1)
Sodium: 140 mmol/L (ref 135–145)

## 2016-11-08 LAB — CBC
HEMATOCRIT: 44 % (ref 36.0–46.0)
HEMOGLOBIN: 14 g/dL (ref 12.0–15.0)
MCH: 28.3 pg (ref 26.0–34.0)
MCHC: 31.8 g/dL (ref 30.0–36.0)
MCV: 88.9 fL (ref 78.0–100.0)
Platelets: 308 10*3/uL (ref 150–400)
RBC: 4.95 MIL/uL (ref 3.87–5.11)
RDW: 14 % (ref 11.5–15.5)
WBC: 6.6 10*3/uL (ref 4.0–10.5)

## 2016-11-08 LAB — HCG, SERUM, QUALITATIVE: Preg, Serum: NEGATIVE

## 2016-11-08 MED ORDER — CHLORHEXIDINE GLUCONATE CLOTH 2 % EX PADS: 6.0000 | MEDICATED_PAD | Freq: Once | CUTANEOUS | Status: DC

## 2016-11-08 NOTE — Pre-Procedure Instructions (Signed)
    Carol Ortega  11/08/2016      [facility] SN:[id number] [place] [address] Phone: [phone] Fax: [phone]    Your procedure is scheduled on 11/13/16.  Report to [facility] Admitting at 530 A.M.  Call this number if you have problems the morning of surgery:  [phone]   Remember:  Do not eat food or drink liquids after midnight.  Take these medicines the morning of surgery with A SIP OF WATER --all inhales   Do not wear jewelry, make-up or nail polish.  Do not wear lotions, powders, or perfumes, or deoderant.  Do not shave 48 hours prior to surgery.  Men may shave face and neck.  Do not bring valuables to the hospital.  [facility] is not responsible for any belongings or valuables.  Contacts, dentures or bridgework may not be worn into surgery.  Leave your suitcase in the car.  After surgery it may be brought to your room.  For patients admitted to the hospital, discharge time will be determined by your treatment team.  Patients discharged the day of surgery will not be allowed to drive home.   Name and phone number of your driver:   Special instructions:  Do not take any aspirin,anti-inflammatories,vitamins,or herbal supplements 5-7 days prior to surgery.  Please read over the following fact sheets that you were given.

## 2016-11-10 ENCOUNTER — Encounter (HOSPITAL_COMMUNITY): Payer: Self-pay | Admitting: Surgery

## 2016-11-10 NOTE — H&P (Signed)
General Surgery [facility]  Carol Ortega DOB: 07-30-1971 Single / Language: [name] / Race: Black or African American Female  History of Present Illness The patient is a 45 year old female who presents with a parathyroid neoplasm.  Patient is referred by Dr. Philemon Kingdom for surgical evaluation and management of primary hyperparathyroidism. Patient's primary care provider is Carol Ortega. Patient's urologist is Dr. Phebe Colla. Patient was first noted to have hypercalcemia approximately 2 years ago. She had had multiple kidney stones. Stone analysis revealed calcium stones. Evaluation showed an elevated serum calcium level which is ranged from 10.8-11.4. Recent intact PTH level was elevated at 110. Patient has had vitamin D deficiency and has been treated but remains with a low 25 hydroxy vitamin D level of 11.4. Patient complains of fatigue, hoarseness, nephrolithiasis, bone and joint pain, and intermittent abdominal discomfort. She does have a history of radiation therapy for a brain lesion in 2014. She developed hoarseness following this procedure. There is a family history of thyroid disease and the patient's mother and sister. There is no other family history of endocrine neoplasms.  Patient underwent nuclear medicine parathyroid scan on September 20, 2016. This showed an area of increased tracer uptake at the right superior position suspicious for parathyroid adenoma. Patient underwent ultrasound examination of the neck on September 29, 2016. Thyroid gland was essentially normal. There was a 1.1 x 0.6 x 0.9 cm hypoechoic nodule posterior to the right superior pole of the thyroid consistent with parathyroid adenoma. Patient is now referred for consideration for surgical resection.   Other Problems  Asthma Kidney Stone Migraine Headache Pulmonary Embolism / Blood Clot in Legs  Past Surgical History Gallbladder Surgery - Laparoscopic Oral  Surgery  Diagnostic Studies History Colonoscopy never Mammogram 1-3 years ago Pap Smear 1-5 years ago  Allergies  Latex Exam Gloves *MEDICAL DEVICES AND SUPPLIES* Rash. Penicillins Hives, Rash, Swelling. Contrast Media Ready-Box *MEDICAL DEVICES AND SUPPLIES* Anaphylaxis. OxyCODONE HCl (Abuse Deter) *ANALGESICS - OPIOID* Itching.  Medication History  Albuterol Sulfate ((2.5 MG/3ML)0.083% Nebulized Soln, Inhalation) Active. Lisinopril-Hydrochlorothiazide (20-12.5MG  Tablet, Oral) Active. Vitamin D3 (5000UNIT Capsule, Oral) Active. Medications Reconciled  Social History Alcohol use Occasional alcohol use. Caffeine use Tea. No drug use Tobacco use Never smoker.  Family History  Heart Disease Mother. Heart disease in female family member before age 23 Respiratory Condition Father, Mother, Sister, Son.  Pregnancy / Birth History Age at menarche 88 years. Gravida 6 Irregular periods Length (months) of breastfeeding 3-6 Maternal age 19-20 Para 4  Review of Systems HEENT Present- Hoarseness, Seasonal Allergies and Sore Throat. Not Present- Earache, Hearing Loss, Nose Bleed, Oral Ulcers, Ringing in the Ears, Sinus Pain, Visual Disturbances, Wears glasses/contact lenses and Yellow Eyes. Respiratory Not Present- Bloody sputum, Chronic Cough, Difficulty Breathing, Snoring and Wheezing. Breast Not Present- Breast Mass, Breast Pain, Nipple Discharge and Skin Changes. Cardiovascular Not Present- Chest Pain, Difficulty Breathing Lying Down, Leg Cramps, Palpitations, Rapid Heart Rate, Shortness of Breath and Swelling of Extremities. Gastrointestinal Not Present- Abdominal Pain, Bloating, Bloody Stool, Change in Bowel Habits, Chronic diarrhea, Constipation, Difficulty Swallowing, Excessive gas, Gets full quickly at meals, Hemorrhoids, Indigestion, Nausea, Rectal Pain and Vomiting. Female Genitourinary Not Present- Frequency, Nocturia, Painful Urination, Pelvic Pain  and Urgency. Musculoskeletal Present- Joint Pain. Not Present- Back Pain, Joint Stiffness, Muscle Pain, Muscle Weakness and Swelling of Extremities. Neurological Present- Headaches and Tingling. Not Present- Decreased Memory, Fainting, Numbness, Seizures, Tremor, Trouble walking and Weakness. Psychiatric Present- Anxiety. Not Present- Bipolar, Change in  Sleep Pattern, Depression, Fearful and Frequent crying. Endocrine Present- Hot flashes. Not Present- Cold Intolerance, Excessive Hunger, Hair Changes, Heat Intolerance and New Diabetes. Hematology Not Present- Blood Thinners, Easy Bruising, Excessive bleeding, Gland problems, HIV and Persistent Infections.  Vitals Weight: 348 lb Height: 66in Body Surface Area: 2.53 m Body Mass Index: 56.17 kg/m  Temp.: 97.77F(Oral)  BP: 126/84 (Sitting, Left Arm, Standard)   Physical Exam The physical exam findings are as follows: Note:General - appears comfortable, no distress; not diaphorectic  HEENT - normocephalic; sclerae clear, gaze conjugate; mucous membranes moist, dentition good; voice normal  Neck - symmetric on extension; no palpable anterior or posterior cervical adenopathy; no palpable masses in the thyroid bed  Chest - clear bilaterally without rhonchi, rales, or wheeze  Cor - regular rhythm with normal rate; no significant murmur  Ext - non-tender without significant edema or lymphedema  Neuro - grossly intact; no tremor    Assessment & Plan  PRIMARY HYPERPARATHYROIDISM (E21.0)  Pt Education - Pamphlet Given - The Parathyroid Surgery Book: discussed with patient and provided information. Patient presents with signs and symptoms of primary hyperparathyroidism. Patient is provided with written literature on parathyroid disease to review at home.  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 I have recommended proceeding with an outpatient surgical procedure for right superior  parathyroidectomy by minimally invasive technique. The patient and I discussed the risk and benefits of the procedure. We discussed her postoperative recovery. We discussed the potential for a second parathyroid gland adenoma. She understands and wishes to proceed with surgery in the near future.  The risks and benefits of the procedure have been discussed at length with the patient. The patient understands the proposed procedure, potential alternative treatments, and the course of recovery to be expected. All of the patient's questions have been answered at this time. The patient wishes to proceed with surgery.  Earnstine Regal, MD, [facility] Office: [phone]

## 2016-11-13 ENCOUNTER — Ambulatory Visit (HOSPITAL_COMMUNITY): Payer: Medicaid Other | Admitting: Emergency Medicine

## 2016-11-13 ENCOUNTER — Encounter (HOSPITAL_COMMUNITY): Payer: Self-pay | Admitting: Anesthesiology

## 2016-11-13 ENCOUNTER — Ambulatory Visit (HOSPITAL_COMMUNITY)
Admission: RE | Admit: 2016-11-13 | Discharge: 2016-11-13 | Disposition: A | Discharge status: Home / Self Care | Payer: Medicaid Other | Source: Ambulatory Visit | Attending: Surgery | Admitting: Surgery

## 2016-11-13 ENCOUNTER — Ambulatory Visit (HOSPITAL_COMMUNITY): Payer: Medicaid Other | Admitting: Anesthesiology

## 2016-11-13 ENCOUNTER — Encounter (HOSPITAL_COMMUNITY)
Admission: RE | Disposition: A | Discharge status: Home / Self Care | Payer: Self-pay | Source: Ambulatory Visit | Attending: Surgery

## 2016-11-13 DIAGNOSIS — Z880 Allergy status to penicillin: Secondary | ICD-10-CM | POA: Insufficient documentation

## 2016-11-13 DIAGNOSIS — E559 Vitamin D deficiency, unspecified: Secondary | ICD-10-CM | POA: Insufficient documentation

## 2016-11-13 DIAGNOSIS — Z79899 Other long term (current) drug therapy: Secondary | ICD-10-CM | POA: Diagnosis not present

## 2016-11-13 DIAGNOSIS — Z923 Personal history of irradiation: Secondary | ICD-10-CM | POA: Insufficient documentation

## 2016-11-13 DIAGNOSIS — E210 Primary hyperparathyroidism: Secondary | ICD-10-CM | POA: Diagnosis present

## 2016-11-13 DIAGNOSIS — J45909 Unspecified asthma, uncomplicated: Secondary | ICD-10-CM | POA: Insufficient documentation

## 2016-11-13 DIAGNOSIS — D351 Benign neoplasm of parathyroid gland: Secondary | ICD-10-CM | POA: Insufficient documentation

## 2016-11-13 DIAGNOSIS — E213 Hyperparathyroidism, unspecified: Secondary | ICD-10-CM | POA: Diagnosis present

## 2016-11-13 HISTORY — PX: PARATHYROIDECTOMY: SHX19

## 2016-11-13 SURGERY — PARATHYROIDECTOMY
Anesthesia: General | Site: Neck | Laterality: Right

## 2016-11-13 MED ORDER — BUPIVACAINE HCL (PF) 0.25 % IJ SOLN
INTRAMUSCULAR | Status: AC
  Filled 2016-11-13: qty 30

## 2016-11-13 MED ORDER — FENTANYL CITRATE (PF) 100 MCG/2ML IJ SOLN
INTRAMUSCULAR | Status: AC
  Filled 2016-11-13: qty 2

## 2016-11-13 MED ORDER — ONDANSETRON HCL 4 MG/2ML IJ SOLN: 4.0000 mg | Freq: Four times a day (QID) | INTRAMUSCULAR | Status: DC | PRN

## 2016-11-13 MED ORDER — OXYCODONE HCL 5 MG/5ML PO SOLN: 5.0000 mg | Freq: Once | ORAL | Status: DC | PRN

## 2016-11-13 MED ORDER — HYDROCODONE-ACETAMINOPHEN 5-325 MG PO TABS: 1.0000 | ORAL_TABLET | ORAL | 0 refills | Status: DC | PRN

## 2016-11-13 MED ORDER — DEXAMETHASONE SODIUM PHOSPHATE 10 MG/ML IJ SOLN
INTRAMUSCULAR | Status: DC | PRN
  Administered 2016-11-13: 10 mg via INTRAVENOUS

## 2016-11-13 MED ORDER — HYDROMORPHONE HCL 1 MG/ML IJ SOLN
INTRAMUSCULAR | Status: DC
Start: 2016-11-13 — End: 2016-11-13
  Filled 2016-11-13: qty 0.5

## 2016-11-13 MED ORDER — FENTANYL CITRATE (PF) 100 MCG/2ML IJ SOLN
INTRAMUSCULAR | Status: AC
  Filled 2016-11-13: qty 4

## 2016-11-13 MED ORDER — OXYCODONE HCL 5 MG PO TABS: 5.0000 mg | ORAL_TABLET | Freq: Once | ORAL | Status: DC | PRN

## 2016-11-13 MED ORDER — FENTANYL CITRATE (PF) 100 MCG/2ML IJ SOLN
INTRAMUSCULAR | Status: DC | PRN
  Administered 2016-11-13 (×2): 100 ug via INTRAVENOUS

## 2016-11-13 MED ORDER — 0.9 % SODIUM CHLORIDE (POUR BTL) OPTIME
TOPICAL | Status: DC | PRN
  Administered 2016-11-13: 1000 mL

## 2016-11-13 MED ORDER — ROCURONIUM BROMIDE 100 MG/10ML IV SOLN
INTRAVENOUS | Status: DC | PRN
  Administered 2016-11-13: 30 mg via INTRAVENOUS
  Administered 2016-11-13: 20 mg via INTRAVENOUS

## 2016-11-13 MED ORDER — BUPIVACAINE HCL (PF) 0.25 % IJ SOLN
INTRAMUSCULAR | Status: DC | PRN
  Administered 2016-11-13: 10 mL

## 2016-11-13 MED ORDER — HEMOSTATIC AGENTS (NO CHARGE) OPTIME
TOPICAL | Status: DC | PRN
  Administered 2016-11-13: 1

## 2016-11-13 MED ORDER — PROPOFOL 10 MG/ML IV BOLUS
INTRAVENOUS | Status: DC | PRN
  Administered 2016-11-13: 200 mg via INTRAVENOUS

## 2016-11-13 MED ORDER — LACTATED RINGERS IV SOLN
INTRAVENOUS | Status: DC | PRN
  Administered 2016-11-13 (×2): via INTRAVENOUS

## 2016-11-13 MED ORDER — LIDOCAINE HCL (CARDIAC) 20 MG/ML IV SOLN
INTRAVENOUS | Status: DC | PRN
Start: 2016-11-13 — End: 2016-11-13
  Administered 2016-11-13: 100 mg via INTRAVENOUS

## 2016-11-13 MED ORDER — CIPROFLOXACIN IN D5W 400 MG/200ML IV SOLN
400.0000 mg | INTRAVENOUS | Status: AC
  Administered 2016-11-13: 400 mg via INTRAVENOUS
  Filled 2016-11-13: qty 200

## 2016-11-13 MED ORDER — SUGAMMADEX SODIUM 500 MG/5ML IV SOLN
INTRAVENOUS | Status: DC | PRN
  Administered 2016-11-13: 500 mg via INTRAVENOUS

## 2016-11-13 MED ORDER — PROPOFOL 10 MG/ML IV BOLUS
INTRAVENOUS | Status: AC
  Filled 2016-11-13: qty 20

## 2016-11-13 MED ORDER — MIDAZOLAM HCL 2 MG/2ML IJ SOLN
INTRAMUSCULAR | Status: AC
  Filled 2016-11-13: qty 2

## 2016-11-13 MED ORDER — ONDANSETRON HCL 4 MG/2ML IJ SOLN
INTRAMUSCULAR | Status: DC | PRN
Start: 2016-11-13 — End: 2016-11-13
  Administered 2016-11-13 (×2): 4 mg via INTRAVENOUS

## 2016-11-13 MED ORDER — FENTANYL CITRATE (PF) 100 MCG/2ML IJ SOLN
100.0000 ug | Freq: Once | INTRAMUSCULAR | Status: AC
  Administered 2016-11-13 (×2): 50 ug via INTRAVENOUS

## 2016-11-13 MED ORDER — MIDAZOLAM HCL 5 MG/5ML IJ SOLN
INTRAMUSCULAR | Status: DC | PRN
  Administered 2016-11-13: 2 mg via INTRAVENOUS

## 2016-11-13 MED ORDER — HYDROMORPHONE HCL 1 MG/ML IJ SOLN
INTRAMUSCULAR | Status: AC
  Filled 2016-11-13: qty 0.5

## 2016-11-13 MED ORDER — HYDROMORPHONE HCL 1 MG/ML IJ SOLN
0.2500 mg | INTRAMUSCULAR | Status: DC | PRN
  Administered 2016-11-13 (×3): 0.5 mg via INTRAVENOUS

## 2016-11-13 MED ORDER — SUCCINYLCHOLINE CHLORIDE 20 MG/ML IJ SOLN
INTRAMUSCULAR | Status: DC | PRN
  Administered 2016-11-13: 120 mg via INTRAVENOUS

## 2016-11-13 SURGICAL SUPPLY — 51 items
ATTRACTOMAT 16X20 MAGNETIC DRP (DRAPES) ×3 IMPLANT
BLADE SURG 15 STRL LF DISP TIS (BLADE) ×1 IMPLANT
BLADE SURG 15 STRL SS (BLADE) ×2
CANISTER SUCTION 2500CC (MISCELLANEOUS) ×3 IMPLANT
CHLORAPREP W/TINT 26ML (MISCELLANEOUS) ×3 IMPLANT
CLIP TI MEDIUM 6 (CLIP) ×3 IMPLANT
CLIP TI WIDE RED SMALL 6 (CLIP) ×3 IMPLANT
CONT SPEC 4OZ CLIKSEAL STRL BL (MISCELLANEOUS) ×3 IMPLANT
COVER SURGICAL LIGHT HANDLE (MISCELLANEOUS) ×3 IMPLANT
CRADLE DONUT ADULT HEAD (MISCELLANEOUS) ×3 IMPLANT
DECANTER SPIKE VIAL GLASS SM (MISCELLANEOUS) ×3 IMPLANT
DRAPE LAPAROTOMY 100X72 PEDS (DRAPES) ×3 IMPLANT
DRAPE UTILITY XL STRL (DRAPES) ×3 IMPLANT
ELECT CAUTERY BLADE 6.4 (BLADE) ×3 IMPLANT
ELECT REM PT RETURN 9FT ADLT (ELECTROSURGICAL) ×3
ELECTRODE REM PT RTRN 9FT ADLT (ELECTROSURGICAL) ×1 IMPLANT
GAUZE SPONGE 4X4 16PLY XRAY LF (GAUZE/BANDAGES/DRESSINGS) ×3 IMPLANT
GLOVE BIOGEL PI IND STRL 6.5 (GLOVE) ×1 IMPLANT
GLOVE BIOGEL PI IND STRL 7.0 (GLOVE) ×1 IMPLANT
GLOVE BIOGEL PI IND STRL 8 (GLOVE) ×1 IMPLANT
GLOVE BIOGEL PI INDICATOR 6.5 (GLOVE) ×2
GLOVE BIOGEL PI INDICATOR 7.0 (GLOVE) ×2
GLOVE BIOGEL PI INDICATOR 8 (GLOVE) ×2
GLOVE SURG SS PI 6.5 STRL IVOR (GLOVE) ×3 IMPLANT
GLOVE SURG SS PI 7.0 STRL IVOR (GLOVE) ×3 IMPLANT
GLOVE SURG SS PI 8.0 STRL IVOR (GLOVE) ×6 IMPLANT
GOWN STRL REUS W/ TWL LRG LVL3 (GOWN DISPOSABLE) ×3 IMPLANT
GOWN STRL REUS W/ TWL XL LVL3 (GOWN DISPOSABLE) ×1 IMPLANT
GOWN STRL REUS W/TWL LRG LVL3 (GOWN DISPOSABLE) ×6
GOWN STRL REUS W/TWL XL LVL3 (GOWN DISPOSABLE) ×2
HEMOSTAT SURGICEL 2X4 FIBR (HEMOSTASIS) ×3 IMPLANT
KIT BASIN OR (CUSTOM PROCEDURE TRAY) ×3 IMPLANT
KIT ROOM TURNOVER OR (KITS) ×3 IMPLANT
NEEDLE HYPO 25GX1X1/2 BEV (NEEDLE) ×3 IMPLANT
NS IRRIG 1000ML POUR BTL (IV SOLUTION) ×3 IMPLANT
PACK SURGICAL SETUP 50X90 (CUSTOM PROCEDURE TRAY) ×3 IMPLANT
PAD ARMBOARD 7.5X6 YLW CONV (MISCELLANEOUS) ×3 IMPLANT
PENCIL BUTTON HOLSTER BLD 10FT (ELECTRODE) ×3 IMPLANT
SPONGE INTESTINAL PEANUT (DISPOSABLE) ×3 IMPLANT
SUT MNCRL AB 4-0 PS2 18 (SUTURE) ×3 IMPLANT
SUT SILK 2 0 (SUTURE) ×2
SUT SILK 2-0 18XBRD TIE 12 (SUTURE) ×1 IMPLANT
SUT SILK 3 0 (SUTURE) ×2
SUT SILK 3-0 18XBRD TIE 12 (SUTURE) ×1 IMPLANT
SUT VIC AB 3-0 SH 18 (SUTURE) ×3 IMPLANT
SYR BULB 3OZ (MISCELLANEOUS) ×3 IMPLANT
SYR CONTROL 10ML LL (SYRINGE) ×3 IMPLANT
TOWEL OR 17X24 6PK STRL BLUE (TOWEL DISPOSABLE) IMPLANT
TOWEL OR 17X26 10 PK STRL BLUE (TOWEL DISPOSABLE) ×3 IMPLANT
TUBE CONNECTING 12'X1/4 (SUCTIONS) ×1
TUBE CONNECTING 12X1/4 (SUCTIONS) ×2 IMPLANT

## 2016-11-13 NOTE — Anesthesia Postprocedure Evaluation (Signed)
Anesthesia Post Note  Patient: Carol Ortega  Procedure(s) Performed: Procedure(s) (LRB): RIGHT SUPERIOR PARATHYROIDECTOMY (Right)  Patient location during evaluation: PACU Anesthesia Type: General Level of consciousness: awake and alert and patient cooperative Pain management: pain level controlled Vital Signs Assessment: post-procedure vital signs reviewed and stable Respiratory status: spontaneous breathing and respiratory function stable Cardiovascular status: stable Anesthetic complications: no    Last Vitals:  Vitals:   11/13/16 0920 11/13/16 0941  BP: 137/89 110/82  Pulse: 88 86  Resp: 17 18  Temp:      Last Pain:  Vitals:   11/13/16 0941  TempSrc:   PainSc: [place]

## 2016-11-13 NOTE — Progress Notes (Signed)
Order furnished for pt. Complaint of pain relative to "Kidney Stone moving". ( Per Dr. Marcie Bal)

## 2016-11-13 NOTE — Anesthesia Preprocedure Evaluation (Signed)
Anesthesia Evaluation  Patient identified by MRN, date of birth, ID band Patient awake    Reviewed: Allergy & Precautions, H&P , NPO status , Patient's Chart, lab work & pertinent test results  Airway Mallampati: II   Neck ROM: full    Dental   Pulmonary asthma ,    breath sounds clear to auscultation       Cardiovascular hypertension,  Rhythm:regular Rate:Normal     Neuro/Psych  Headaches, Anxiety Depression    GI/Hepatic   Endo/Other  Morbid obesity  Renal/GU      Musculoskeletal   Abdominal   Peds  Hematology   Anesthesia Other Findings   Reproductive/Obstetrics                             Anesthesia Physical Anesthesia Plan  ASA: II  Anesthesia Plan: General   Post-op Pain Management:    Induction: Intravenous  Airway Management Planned: Oral ETT  Additional Equipment:   Intra-op Plan:   Post-operative Plan: Extubation in OR  Informed Consent: I have reviewed the patients History and Physical, chart, labs and discussed the procedure including the risks, benefits and alternatives for the proposed anesthesia with the patient or authorized representative who has indicated his/her understanding and acceptance.     Plan Discussed with: CRNA, Anesthesiologist and Surgeon  Anesthesia Plan Comments:         Anesthesia Quick Evaluation

## 2016-11-13 NOTE — Op Note (Signed)
OPERATIVE REPORT - PARATHYROIDECTOMY  Preoperative diagnosis: Primary hyperparathyroidism  Postop diagnosis: Same  Procedure: Right superior minimally invasive parathyroidectomy  Surgeon:  Earnstine Regal, MD, FACS  Anesthesia: Gen. endotracheal  Estimated blood loss: Minimal  Preparation: ChloraPrep  Indications: Patient is referred by Dr. Philemon Kingdom for surgical evaluation and management of primary hyperparathyroidism. Patient's primary care provider is Eldridge Abrahams. Patient's urologist is Dr. Phebe Colla. Patient was first noted to have hypercalcemia approximately 2 years ago. She had had multiple kidney stones. Stone analysis revealed calcium stones. Evaluation showed an elevated serum calcium level which is ranged from 10.8-11.4. Recent intact PTH level was elevated at 110. Patient has had vitamin D deficiency and has been treated but remains with a low 25 hydroxy vitamin D level of 11.4. Patient complains of fatigue, hoarseness, nephrolithiasis, bone and joint pain, and intermittent abdominal discomfort. She does have a history of radiation therapy for a brain lesion in 2014. She developed hoarseness following this procedure. There is a family history of thyroid disease and the patient's mother and sister. There is no other family history of endocrine neoplasms. Patient underwent nuclear medicine parathyroid scan on September 20, 2016. This showed an area of increased tracer uptake at the right superior position suspicious for parathyroid adenoma. Patient underwent ultrasound examination of the neck on September 29, 2016. Thyroid gland was essentially normal. There was a 1.1 x 0.6 x 0.9 cm hypoechoic nodule posterior to the right superior pole of the thyroid consistent with parathyroid adenoma.   Procedure: Patient was prepared in the holding area. He was brought to operating room and placed in a supine position on the operating room table. Following administration of general  anesthesia, the patient was positioned and then prepped and draped in the usual strict aseptic fashion. After ascertaining that an adequate level of anesthesia been achieved, a neck incision was made with a #15 blade. Dissection was carried through subcutaneous tissues and platysma. Hemostasis was obtained with the electrocautery. Skin flaps were developed circumferentially and a Weitlander retractor was placed for exposure.  Strap muscles were incised in the midline. Strap muscles were reflected exposing the thyroid lobe. With gentle blunt dissection the thyroid lobe was mobilized.  Dissection was carried through adipose tissue and an enlarged parathyroid gland was identified. It was gently mobilized. Vascular structures were divided between small and medium ligaclips. Care was taken to avoid the recurrent laryngeal nerve and the esophagus. The parathyroid gland was completely excised. It was submitted to pathology where frozen section confirmed parathyroid tissue consistent with adenoma.  Neck was irrigated with warm saline and good hemostasis was noted. Fibrillar was placed in the operative field. Strap muscles were reapproximated in the midline with interrupted 3-0 Vicryl sutures. Platysma was closed with interrupted 3-0 Vicryl sutures. Skin was closed with a running 4-0 Monocryl subcuticular suture. Marcaine was infiltrated circumferentially. Wound was washed and dried and benzoin and Steri-Strips were applied. Sterile gauze dressings were applied. Patient was awakened from anesthesia and brought to the recovery room. The patient tolerated the procedure well.   Earnstine Regal, MD, [facility]

## 2016-11-13 NOTE — Interval H&P Note (Signed)
History and Physical Interval Note:  11/13/2016 7:14 AM  Carol Ortega  has presented today for surgery, with the diagnosis of Primary hyperthyroidism  The various methods of treatment have been discussed with the patient and family. After consideration of risks, benefits and other options for treatment, the patient has consented to    Procedure(s): RIGHT SUPERIOR PARATHYROIDECTOMY (Right) as a surgical intervention .    The patient's history has been reviewed, patient examined, no change in status, stable for surgery.  I have reviewed the patient's chart and labs.  Questions were answered to the patient's satisfaction.    Earnstine Regal, MD, [facility] Office: [place]

## 2016-11-13 NOTE — Transfer of Care (Signed)
Immediate Anesthesia Transfer of Care Note  Patient: Carol Ortega  Procedure(s) Performed: Procedure(s): RIGHT SUPERIOR PARATHYROIDECTOMY (Right)  Patient Location: PACU  Anesthesia Type:General  Level of Consciousness: awake, alert , oriented and patient cooperative  Airway & Oxygen Therapy: Patient Spontanous Breathing and Patient connected to face mask oxygen  Post-op Assessment: Report given to RN and Post -op Vital signs reviewed and stable  Post vital signs: Reviewed and stable  Last Vitals:  Vitals:   11/13/16 0657 11/13/16 0848  BP: (!) 176/123   Pulse:    Resp:    Temp:  (P) 36.4 C    Last Pain:  Vitals:   11/13/16 0709  TempSrc:   PainSc: 10-Worst pain ever         Complications: No apparent anesthesia complications

## 2016-11-14 ENCOUNTER — Encounter (HOSPITAL_COMMUNITY): Payer: Self-pay | Admitting: Surgery

## 2016-11-26 ENCOUNTER — Telehealth: Payer: Self-pay | Admitting: General Surgery

## 2016-11-26 NOTE — Telephone Encounter (Signed)
Pt having diarrhea for ~2 days.  No fevers or nausea.  Urinating well.  She is 1 week s/p parathyroid surgery.  Recommended that she try imodium today and call the office tomorrow if her symptoms persist.

## 2016-12-31 ENCOUNTER — Emergency Department (HOSPITAL_COMMUNITY): Payer: Medicaid Other

## 2016-12-31 ENCOUNTER — Emergency Department (HOSPITAL_COMMUNITY)
Admission: EM | Admit: 2016-12-31 | Discharge: 2017-01-01 | Disposition: A | Discharge status: Home / Self Care | Payer: Medicaid Other | Attending: Emergency Medicine | Admitting: Emergency Medicine

## 2016-12-31 ENCOUNTER — Encounter (HOSPITAL_COMMUNITY): Payer: Self-pay | Admitting: *Deleted

## 2016-12-31 DIAGNOSIS — I10 Essential (primary) hypertension: Secondary | ICD-10-CM | POA: Insufficient documentation

## 2016-12-31 DIAGNOSIS — R109 Unspecified abdominal pain: Secondary | ICD-10-CM | POA: Diagnosis present

## 2016-12-31 DIAGNOSIS — K5900 Constipation, unspecified: Secondary | ICD-10-CM

## 2016-12-31 DIAGNOSIS — Z91040 Latex allergy status: Secondary | ICD-10-CM | POA: Diagnosis not present

## 2016-12-31 DIAGNOSIS — J45909 Unspecified asthma, uncomplicated: Secondary | ICD-10-CM | POA: Diagnosis not present

## 2016-12-31 DIAGNOSIS — Z79899 Other long term (current) drug therapy: Secondary | ICD-10-CM | POA: Insufficient documentation

## 2016-12-31 DIAGNOSIS — Z859 Personal history of malignant neoplasm, unspecified: Secondary | ICD-10-CM | POA: Insufficient documentation

## 2016-12-31 LAB — COMPREHENSIVE METABOLIC PANEL
ALBUMIN: 3.4 g/dL — AB (ref 3.5–5.0)
ALT: 9 U/L — ABNORMAL LOW (ref 14–54)
ANION GAP: 10 (ref 5–15)
AST: 13 U/L — AB (ref 15–41)
Alkaline Phosphatase: 109 U/L (ref 38–126)
BILIRUBIN TOTAL: 0.4 mg/dL (ref 0.3–1.2)
BUN: 6 mg/dL (ref 6–20)
CO2: 25 mmol/L (ref 22–32)
Calcium: 8.9 mg/dL (ref 8.9–10.3)
Chloride: 105 mmol/L (ref 101–111)
Creatinine, Ser: 0.76 mg/dL (ref 0.44–1.00)
GFR calc Af Amer: >60 mL/min (ref >60)
GFR calc non Af Amer: >60 mL/min (ref >60)
GLUCOSE: 100 mg/dL — AB (ref 65–99)
POTASSIUM: 4.1 mmol/L (ref 3.5–5.1)
SODIUM: 140 mmol/L (ref 135–145)
TOTAL PROTEIN: 7 g/dL (ref 6.5–8.1)

## 2016-12-31 LAB — URINALYSIS, ROUTINE W REFLEX MICROSCOPIC
BILIRUBIN URINE: NEGATIVE
Bacteria, UA: NONE SEEN
GLUCOSE, UA: NEGATIVE mg/dL
KETONES UR: NEGATIVE mg/dL
LEUKOCYTES UA: NEGATIVE
NITRITE: NEGATIVE
PH: 6 (ref 5.0–8.0)
Protein, ur: 30 mg/dL — AB
Specific Gravity, Urine: 1.02 (ref 1.005–1.030)

## 2016-12-31 LAB — LIPASE, BLOOD: Lipase: 17 U/L (ref 11–51)

## 2016-12-31 LAB — CBC
HCT: 42.3 % (ref 36.0–46.0)
Hemoglobin: 13.5 g/dL (ref 12.0–15.0)
MCH: 28.1 pg (ref 26.0–34.0)
MCHC: 31.9 g/dL (ref 30.0–36.0)
MCV: 88.1 fL (ref 78.0–100.0)
PLATELETS: 319 10*3/uL (ref 150–400)
RBC: 4.8 MIL/uL (ref 3.87–5.11)
RDW: 13.5 % (ref 11.5–15.5)
WBC: 6.8 10*3/uL (ref 4.0–10.5)

## 2016-12-31 LAB — PREGNANCY, URINE: Preg Test, Ur: NEGATIVE

## 2016-12-31 MED ORDER — ONDANSETRON HCL 4 MG PO TABS
4.0000 mg | ORAL_TABLET | Freq: Once | ORAL | Status: AC
  Administered 2016-12-31: 4 mg via ORAL
  Filled 2016-12-31: qty 1

## 2016-12-31 MED ORDER — POLYETHYLENE GLYCOL 3350 17 G PO PACK: 17.0000 g | PACK | Freq: Every day | ORAL | 0 refills | Status: DC

## 2016-12-31 MED ORDER — KETOROLAC TROMETHAMINE 15 MG/ML IJ SOLN
15.0000 mg | Freq: Once | INTRAMUSCULAR | Status: AC
  Administered 2016-12-31: 15 mg via INTRAMUSCULAR
  Filled 2016-12-31: qty 1

## 2016-12-31 NOTE — ED Notes (Signed)
Patient transported to CT 

## 2016-12-31 NOTE — ED Notes (Signed)
Pt presents with mild abdominal pain for 3 weeks; came in today because she developed flank pain.  Has hx kidney stones.

## 2016-12-31 NOTE — ED Provider Notes (Signed)
[place] DEPT Provider Note   CSN: [id number] Arrival date & time: 12/31/16  1800     History   Chief Complaint Chief Complaint  Patient presents with  . Flank Pain    HPI Carol Ortega is a 46 y.o. female.  HPI   46 year old female presents today with complaints of left flank pain. Patient notes 2 days of left flank pain described as sharp, coming and going. Patient notes this feels similar to her previous kidney stones. Patient also notes that on December 7 she had a parathyroidectomy. She notes since then she has had constipation, notes that she continues to pass gas, and is able to move her bowels with herbal tea. Most recently patient has been using Imodium and black seed bitters as she feels like her flank pain is secondary to gas. She denies any vomiting or fevers. She notes that after using herbal tea she does have some diarrhea. Patient denies any upper respiratory complaints including shortness of breath, cough, congestion.   Past Medical History:  Diagnosis Date  . Anemia   . Anxiety   . Asthma    seasonal  . Brain tumor ([place]) 2014   tx with radiation  . Cancer ([place])   . Complication of anesthesia 1999   lung collapse after surgery for gallbladder  . Depression   . Headache    migraines  . Hoarseness of voice   . Hypertension   . Kidney stone   . Obesity   . Parathyroid abnormality ([place])   . Pulmonary emboli ([place]) 2011  . Radiation    Brain tumor  . UTI (urinary tract infection)   . Vitamin D deficiency     Patient Active Problem List   Diagnosis Date Noted  . Hyperparathyroidism ([place]) 09/10/2015  . Vitamin D deficiency 06/25/2014  . Hypercalcemia 02/09/2014  . 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 CHOLECYSTECTOMY  1999  . CYSTOSCOPY W/ URETEROSCOPY    . CYSTOSCOPY WITH RETROGRADE PYELOGRAM, URETEROSCOPY AND STENT PLACEMENT Left 01/20/2014   Procedure: LEFT URETEROSCOPY WITH STENT  PLACEMENT;  Surgeon: Irine Seal, MD;  Location: [facility];  Service: Urology;  Laterality: Left;  . CYSTOSCOPY WITH RETROGRADE PYELOGRAM, URETEROSCOPY AND STENT PLACEMENT Bilateral 04/23/2015   Procedure: CYSTOSCOPY WITH BILATERAL RETROGRADE PYELOGRAM, URETEROSCOPY AND STENT PLACEMENT;  Surgeon: Alexis Frock, MD;  [facility];  Service: Urology;  Laterality: Bilateral;  . CYSTOSCOPY WITH STENT PLACEMENT Left 01/12/2014   Procedure: CYSTOSCOPY WITH STENT PLACEMENT left retrograde;  Surgeon: Irine Seal, MD;  [facility];  Service: Urology;  Laterality: Left;  . HOLMIUM LASER APPLICATION Left [id number]   Procedure: HOLMIUM LASER APPLICATION;  Surgeon: Irine Seal, MD;  [facility];  Service: Urology;  Laterality: Left;  . HOLMIUM LASER APPLICATION Bilateral [id number]   Procedure: HOLMIUM LASER APPLICATION;  Surgeon: Alexis Frock, MD;  [facility];  Service: Urology;  Laterality: Bilateral;  . kidney stone removal    . LITHOTRIPSY    . PARATHYROIDECTOMY Right 11/13/2016   Procedure: RIGHT SUPERIOR PARATHYROIDECTOMY;  Surgeon: Armandina Gemma, MD;  Location: [facility];  Service: General;  Laterality: Right;  . surgery for,ectopic pregnancy  2011    1 fallopian tube rupture  . TUBAL LIGATION  2011    OB History    No data available       Home Medications    Prior to Admission medications   Medication Sig Start Date End Date Taking? Authorizing Provider  lisinopril-hydrochlorothiazide (PRINZIDE,ZESTORETIC) 20-12.5 MG per tablet Take 1 tablet by mouth daily.    Yes Historical Provider, MD  PROVENTIL HFA 108 (90 Base) MCG/ACT inhaler Inhale 2 puffs into the lungs every 6 (six) hours as needed for wheezing or shortness of breath.  06/12/16  Yes Historical Provider, MD  HYDROcodone-acetaminophen (NORCO/VICODIN) 5-325 MG tablet Take 1-2 tablets by mouth every 4 (four) hours as needed for moderate pain. Patient not taking: Reported on 12/31/2016 11/13/16   Armandina Gemma, MD  polyethylene glycol  [facility]) packet Take 17 g by mouth daily. 12/31/16   Okey Regal, PA-C    Family History Family History  Problem Relation Age of Onset  . Bell's palsy Mother   . Heart failure Mother   . Asthma Father   . Hypertension Father     Social History Social History  Substance Use Topics  . Smoking status: Never Smoker  . Smokeless tobacco: Never Used  . Alcohol use Yes     Comment: holidays     Allergies   Bee venom; Eggs or egg-derived products; Other; Gadobenate; Penicillins; Latex; Oxycodone-acetaminophen; and Penicillin g   Review of Systems Review of Systems  All other systems reviewed and are negative.    Physical Exam Updated Vital Signs BP 118/75   Pulse 79   Temp 98.6 F (37 C)   Resp 18   Ht 5\' 6"  (1.676 m)   Wt (!) 155.8 kg   SpO2 99%   BMI 55.45 kg/m   Physical Exam  Constitutional: She is oriented to person, place, and time. She appears well-developed and well-nourished.  obese  HENT:  Head: Normocephalic and atraumatic.  Eyes: Conjunctivae are normal. Pupils are equal, round, and reactive to light. Right eye exhibits no discharge. Left eye exhibits no discharge. No scleral icterus.  Neck: Normal range of motion. No JVD present. No tracheal deviation present.  Pulmonary/Chest: Effort normal. No stridor.  Abdominal: Soft. She exhibits no mass. There is tenderness. There is no guarding.  Patient has moderate tenderness to palpation to the left lateral flank, no CVA tenderness- remainder of abdomen soft nontender  Neurological: She is alert and oriented to person, place, and time. Coordination normal.  Psychiatric: She has a normal mood and affect. Her behavior is normal. Judgment and thought content normal.  Nursing note and vitals reviewed.    ED Treatments / Results  Labs (all labs ordered are listed, but only abnormal results are displayed) Labs Reviewed  COMPREHENSIVE METABOLIC PANEL - Abnormal; Notable for the following:       Result Value    Glucose, Bld 100 (*)    Albumin 3.4 (*)    AST 13 (*)    ALT 9 (*)    All other components within normal limits  URINALYSIS, ROUTINE W REFLEX MICROSCOPIC - Abnormal; Notable for the following:    Hgb urine dipstick SMALL (*)    Protein, ur 30 (*)    Squamous Epithelial / LPF 0-5 (*)    All other components within normal limits  LIPASE, BLOOD  CBC  PREGNANCY, URINE    EKG  EKG Interpretation None       Radiology Ct Renal Stone Study  Result Date: 12/31/2016 CLINICAL DATA:  Left flank pain. EXAM: CT ABDOMEN AND PELVIS WITHOUT CONTRAST TECHNIQUE: Multidetector CT imaging of the abdomen and pelvis was performed following the standard protocol without IV contrast. COMPARISON:  CT 01/28/2016 FINDINGS: Lower chest: Very faint reticulonodular opacities in the left lower lobe. No confluent airspace  disease. No pleural fluid. Hepatobiliary: Hepatomegaly and hepatic steatosis. Liver spans 21.3 cm cranial caudal. Clips in the gallbladder fossa postcholecystectomy. No biliary dilatation. Pancreas: No ductal dilatation or inflammation. Spleen: Normal in size without focal abnormality. Adrenals/Urinary Tract: No adrenal nodule. Nonobstructing stone in the mid upper left kidney is increased in size from prior exam currently measuring 10 mm. No left hydronephrosis or perinephric edema. There is an 8 mm stone in the right renal pelvis that is new. There is prominence of the right renal pelvis without frank hydronephrosis. Additional punctate nonobstructing stone in the lower right kidney. Low-density lesions within lower poles of both kidneys are incompletely characterized without contrast but likely cysts. Both ureters appear decompressed without stone. Urinary bladder is minimally distended without stone. Stomach/Bowel: Stomach is within normal limits. Appendix appears normal. No evidence of bowel wall thickening, distention, or inflammatory changes. Vascular/Lymphatic: Trace abdominal aortic  atherosclerosis. No abdominal or pelvic adenopathy. Reproductive: Uterus is unremarkable. Linear metallic density about the left fallopian tube may be surgical suture or tubal occlusion device. No adnexal mass. Other: No free air, free fluid, or intra-abdominal fluid collection. Musculoskeletal: There are no acute or suspicious osseous abnormalities. Degenerative change with facet arthropathy in the lumbar spine. There 4 non-rib-bearing lumbar vertebra. IMPRESSION: 1. Bilateral renal calculi, which I have increased in size from prior CT. No left hydronephrosis to explain left flank pain. There is an 8 mm stone in the right renal pelvis without hydronephrosis. 2. Hepatomegaly and hepatic steatosis. 3. Faint reticulonodular opacities at the left lung base may be mild bronchiolitis, likely infectious. Electronically Signed   By: Jeb Levering M.D.   On: 12/31/2016 23:15    Procedures Procedures (including critical care time)  Medications Ordered in ED Medications  ketorolac (TORADOL) 15 MG/ML injection 15 mg (15 mg Intramuscular Given 12/31/16 2149)  ondansetron (ZOFRAN) tablet 4 mg (4 mg Oral Given 12/31/16 2148)     Initial Impression / Assessment and Plan / ED Course  I have reviewed the triage vital signs and the nursing notes.  Pertinent labs & imaging results that were available during my care of the patient were reviewed by me and considered in my medical decision making (see chart for details).  Clinical Course      Labs:Urine pregnancy, urinalysis, lipase, CMP, CBC   Imaging:  Consults:  Therapeutics:  Discharge Meds:   Assessment/Plan:   46 year old female presents today with left flank pain typical of her previous kidney stones. CT shows nephrolithiasis, no signs of ascending stone, obstruction. Patient also having complaints of constipation, patient passing stool with 8 of herbal teas, no signs of obstruction on today's exam. Incidentally patient was noted to have faint  opacities in the left lung base suggesting mild bronchiolitis, she has no infectious etiology on exam today, she was informed of all of her lab and imaging results, she will follow up with gastroenterology if she continues to have intermittent constipation, she'll follow up with urology for findings of nephrolithiasis. Patient given strict return precautions, she verbalized understanding and agreement to today's plan had no further questions or concerns at the time of discharge.      Final Clinical Impressions(s) / ED Diagnoses   Final diagnoses:  Flank pain  Constipation, unspecified constipation type    New Prescriptions New Prescriptions   POLYETHYLENE GLYCOL (MIRALAX) PACKET    Take 17 g by mouth daily.     Okey Regal, PA-C 01/01/17 0040    Charlesetta Shanks, MD 01/02/17 [phone]

## 2016-12-31 NOTE — Discharge Instructions (Signed)
Please read attached information. If you experience any new or worsening signs or symptoms please return to the emergency room for evaluation. Please follow-up with your primary care provider or specialist as discussed. Please use medication prescribed only as directed and discontinue taking if you have any concerning signs or symptoms.   °

## 2016-12-31 NOTE — ED Triage Notes (Signed)
The pt is c/o abd pain for 3 weeks  Loss of appetite   No diarrhea  No bm last week  lmp  Nov  She has irregular periods

## 2017-01-01 ENCOUNTER — Emergency Department (HOSPITAL_COMMUNITY): Payer: Medicaid Other

## 2017-01-01 ENCOUNTER — Encounter (HOSPITAL_COMMUNITY): Payer: Self-pay | Admitting: Emergency Medicine

## 2017-01-01 ENCOUNTER — Emergency Department (HOSPITAL_COMMUNITY)
Admission: EM | Admit: 2017-01-01 | Discharge: 2017-01-02 | Disposition: A | Discharge status: Home / Self Care | Payer: Medicaid Other | Source: Home / Self Care | Attending: Emergency Medicine | Admitting: Emergency Medicine

## 2017-01-01 DIAGNOSIS — I10 Essential (primary) hypertension: Secondary | ICD-10-CM | POA: Insufficient documentation

## 2017-01-01 DIAGNOSIS — J45909 Unspecified asthma, uncomplicated: Secondary | ICD-10-CM | POA: Insufficient documentation

## 2017-01-01 DIAGNOSIS — R1084 Generalized abdominal pain: Secondary | ICD-10-CM | POA: Insufficient documentation

## 2017-01-01 DIAGNOSIS — Z91040 Latex allergy status: Secondary | ICD-10-CM | POA: Insufficient documentation

## 2017-01-01 LAB — CBC WITH DIFFERENTIAL/PLATELET
Basophils Absolute: 0 10*3/uL (ref 0.0–0.1)
Basophils Relative: 1 %
EOS ABS: 0.2 10*3/uL (ref 0.0–0.7)
Eosinophils Relative: 3 %
HEMATOCRIT: 40.8 % (ref 36.0–46.0)
HEMOGLOBIN: 13.1 g/dL (ref 12.0–15.0)
LYMPHS ABS: 2 10*3/uL (ref 0.7–4.0)
LYMPHS PCT: 30 %
MCH: 28.1 pg (ref 26.0–34.0)
MCHC: 32.1 g/dL (ref 30.0–36.0)
MCV: 87.6 fL (ref 78.0–100.0)
MONOS PCT: 9 %
Monocytes Absolute: 0.6 10*3/uL (ref 0.1–1.0)
NEUTROS PCT: 57 %
Neutro Abs: 3.8 10*3/uL (ref 1.7–7.7)
Platelets: 298 10*3/uL (ref 150–400)
RBC: 4.66 MIL/uL (ref 3.87–5.11)
RDW: 13.8 % (ref 11.5–15.5)
WBC: 6.6 10*3/uL (ref 4.0–10.5)

## 2017-01-01 LAB — COMPREHENSIVE METABOLIC PANEL
ALK PHOS: 103 U/L (ref 38–126)
ALT: 9 U/L — AB (ref 14–54)
ANION GAP: 7 (ref 5–15)
AST: 13 U/L — ABNORMAL LOW (ref 15–41)
Albumin: 3.7 g/dL (ref 3.5–5.0)
BILIRUBIN TOTAL: 0.6 mg/dL (ref 0.3–1.2)
BUN: 9 mg/dL (ref 6–20)
CALCIUM: 8.7 mg/dL — AB (ref 8.9–10.3)
CO2: 27 mmol/L (ref 22–32)
CREATININE: 0.72 mg/dL (ref 0.44–1.00)
Chloride: 104 mmol/L (ref 101–111)
GFR calc non Af Amer: >60 mL/min (ref >60)
Glucose, Bld: 101 mg/dL — ABNORMAL HIGH (ref 65–99)
Potassium: 3.6 mmol/L (ref 3.5–5.1)
SODIUM: 138 mmol/L (ref 135–145)
TOTAL PROTEIN: 7.3 g/dL (ref 6.5–8.1)

## 2017-01-01 LAB — LIPASE, BLOOD: LIPASE: 24 U/L (ref 11–51)

## 2017-01-01 MED ORDER — ONDANSETRON 4 MG PO TBDP
4.0000 mg | ORAL_TABLET | Freq: Once | ORAL | Status: AC | PRN
  Administered 2017-01-01: 4 mg via ORAL
  Filled 2017-01-01: qty 1

## 2017-01-01 MED ORDER — ONDANSETRON HCL 4 MG/2ML IJ SOLN
4.0000 mg | Freq: Once | INTRAMUSCULAR | Status: AC
  Administered 2017-01-01: 4 mg via INTRAVENOUS
  Filled 2017-01-01: qty 2

## 2017-01-01 MED ORDER — LORAZEPAM 2 MG/ML IJ SOLN
1.0000 mg | Freq: Once | INTRAMUSCULAR | Status: AC
  Administered 2017-01-01: 1 mg via INTRAVENOUS
  Filled 2017-01-01: qty 1

## 2017-01-01 MED ORDER — MORPHINE SULFATE (PF) 4 MG/ML IV SOLN
4.0000 mg | Freq: Once | INTRAVENOUS | Status: AC
  Administered 2017-01-01: 4 mg via INTRAVENOUS
  Filled 2017-01-01: qty 1

## 2017-01-01 MED ORDER — SODIUM CHLORIDE 0.9 % IV BOLUS (SEPSIS)
1000.0000 mL | Freq: Once | INTRAVENOUS | Status: AC
  Administered 2017-01-01: 1000 mL via INTRAVENOUS

## 2017-01-01 MED ORDER — SODIUM CHLORIDE 0.9 % IV SOLN: INTRAVENOUS | Status: DC

## 2017-01-01 NOTE — ED Notes (Signed)
Bed: WA08 Expected date:  Expected time:  Means of arrival:  Comments: EMS 46 yo female from home/abd pain-constipation after surgery 2 weeks ago

## 2017-01-01 NOTE — ED Triage Notes (Signed)
Pt comes from home via EMS with complaints of abdominal pain and constipation after having surgery 2 weeks ago (thyroid surgery).  Pt has been taking oxycodone post surgery.  Pt reports nausea and vomiting with anything she tries to take in orally. BP 156/105, HR 82 in route. Ambulatory to truck on arrival. Pain seems to be generalized over entire abdomen.

## 2017-01-01 NOTE — ED Provider Notes (Signed)
[facility] DEPT Provider Note   CSN: [id number] Arrival date & time: 01/01/17  2042     History   Chief Complaint Chief Complaint  Patient presents with  . Abdominal Pain  . Constipation  . Emesis    HPI Carol Ortega is a 46 y.o. female.  47 year old female who presents with worsening crampy abdominal pain is been diffuse in nature. Had recent parathyroid surgery 3 weeks ago and has had problems since then. She's had recurrent kidney stones. She was seen last night at [facility] in the chart was reviewed and patient had a renal CT which was negative for stone. It did show possible pneumonia. She however denies any cough or congestion. Denies any urinary symptoms other than her chronic hematuria. States that she started to have emesis today after she couldn't keep down her laxative. She denies any current diarrhea. Does feel as if she has abdominal distention. Had been taking opiates but denies any current use. Pain seems to come in waves and characterized as crampy. Called EMS and was transported here      Past Medical History:  Diagnosis Date  . Anemia   . Anxiety   . Asthma    seasonal  . Brain tumor ([place]) 2014   tx with radiation  . Cancer ([place])   . Complication of anesthesia 1999   lung collapse after surgery for gallbladder  . Depression   . Headache    migraines  . Hoarseness of voice   . Hypertension   . Kidney stone   . Obesity   . Parathyroid abnormality ([place])   . Pulmonary emboli ([place]) 2011  . Radiation    Brain tumor  . UTI (urinary tract infection)   . Vitamin D deficiency     Patient Active Problem List   Diagnosis Date Noted  . Hyperparathyroidism ([place]) 09/10/2015  . Vitamin D deficiency 06/25/2014  . Hypercalcemia 02/09/2014  . 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 CHOLECYSTECTOMY  1999  . CYSTOSCOPY W/ URETEROSCOPY    . CYSTOSCOPY WITH RETROGRADE PYELOGRAM, URETEROSCOPY  AND STENT PLACEMENT Left 01/20/2014   Procedure: LEFT URETEROSCOPY WITH STENT PLACEMENT;  Surgeon: Irine Seal, MD;  [facility];  Service: Urology;  Laterality: Left;  . CYSTOSCOPY WITH RETROGRADE PYELOGRAM, URETEROSCOPY AND STENT PLACEMENT Bilateral 04/23/2015   Procedure: CYSTOSCOPY WITH BILATERAL RETROGRADE PYELOGRAM, URETEROSCOPY AND STENT PLACEMENT;  Surgeon: Alexis Frock, MD;  [facility];  Service: Urology;  Laterality: Bilateral;  . CYSTOSCOPY WITH STENT PLACEMENT Left 01/12/2014   Procedure: CYSTOSCOPY WITH STENT PLACEMENT left retrograde;  Surgeon: Irine Seal, MD;  [facility];  Service: Urology;  Laterality: Left;  . HOLMIUM LASER APPLICATION Left [id number]   Procedure: HOLMIUM LASER APPLICATION;  Surgeon: Irine Seal, MD;  [facility];  Service: Urology;  Laterality: Left;  . HOLMIUM LASER APPLICATION Bilateral [id number]   Procedure: HOLMIUM LASER APPLICATION;  Surgeon: Alexis Frock, MD;  [facility];  Service: Urology;  Laterality: Bilateral;  . kidney stone removal    . LITHOTRIPSY    . PARATHYROIDECTOMY Right 11/13/2016   Procedure: RIGHT SUPERIOR PARATHYROIDECTOMY;  Surgeon: Armandina Gemma, MD;  Location: [facility];  Service: General;  Laterality: Right;  . surgery for,ectopic pregnancy  2011    1 fallopian tube rupture  . TUBAL LIGATION  2011    OB History    No data available       Home Medications    Prior  to Admission medications   Medication Sig Start Date End Date Taking? Authorizing Provider  albuterol (PROVENTIL) (2.5 MG/3ML) 0.083% nebulizer solution Inhale 2.5 mg into the lungs every 6 (six) hours as needed for shortness of breath. 06/26/16 06/26/17 Yes Historical Provider, MD  lisinopril-hydrochlorothiazide (PRINZIDE,ZESTORETIC) 20-12.5 MG per tablet Take 1 tablet by mouth daily.    Yes Historical Provider, MD  polyethylene glycol (MIRALAX) packet Take 17 g by mouth daily. 12/31/16  Yes Jeffrey Hedges, PA-C  PROVENTIL HFA 108 (90 Base) MCG/ACT  inhaler Inhale 2 puffs into the lungs every 6 (six) hours as needed for wheezing or shortness of breath.  06/12/16  Yes Historical Provider, MD  HYDROcodone-acetaminophen (NORCO/VICODIN) 5-325 MG tablet Take 1-2 tablets by mouth every 4 (four) hours as needed for moderate pain. Patient not taking: Reported on 12/31/2016 11/13/16   Armandina Gemma, MD    Family History Family History  Problem Relation Age of Onset  . Bell's palsy Mother   . Heart failure Mother   . Asthma Father   . Hypertension Father     Social History Social History  Substance Use Topics  . Smoking status: Never Smoker  . Smokeless tobacco: Never Used  . Alcohol use Yes     Comment: holidays     Allergies   Bee venom; Eggs or egg-derived products; Other; Gadobenate; Penicillins; Latex; Oxycodone-acetaminophen; and Penicillin g   Review of Systems Review of Systems  All other systems reviewed and are negative.    Physical Exam Updated Vital Signs BP 124/80 (BP Location: Left Arm)   Pulse 85   Temp 98.1 F (36.7 C) (Oral)   Resp 20   SpO2 100%   Physical Exam  Constitutional: She is oriented to person, place, and time. She appears well-developed and well-nourished.  Non-toxic appearance. No distress.  HENT:  Head: Normocephalic and atraumatic.  Eyes: Conjunctivae, EOM and lids are normal. Pupils are equal, round, and reactive to light.  Neck: Normal range of motion. Neck supple. No tracheal deviation present. No thyroid mass present.  Cardiovascular: Normal rate, regular rhythm and normal heart sounds.  Exam reveals no gallop.   No murmur heard. Pulmonary/Chest: Effort normal and breath sounds normal. No stridor. No respiratory distress. She has no decreased breath sounds. She has no wheezes. She has no rhonchi. She has no rales.  Abdominal: Soft. Normal appearance and bowel sounds are normal. She exhibits no distension and no ascites. There is tenderness in the left lower quadrant. There is no rebound  and no CVA tenderness.    Musculoskeletal: Normal range of motion. She exhibits no edema or tenderness.  Neurological: She is alert and oriented to person, place, and time. She has normal strength. No cranial nerve deficit or sensory deficit. GCS eye subscore is 4. GCS verbal subscore is 5. GCS motor subscore is 6.  Skin: Skin is warm and dry. No abrasion and no rash noted.  Psychiatric: She has a normal mood and affect. Her speech is normal and behavior is normal.  Nursing note and vitals reviewed.    ED Treatments / Results  Labs (all labs ordered are listed, but only abnormal results are displayed) Labs Reviewed  CBC WITH DIFFERENTIAL/PLATELET  COMPREHENSIVE METABOLIC PANEL  LIPASE, BLOOD    EKG  EKG Interpretation None       Radiology Ct Renal Stone Study  Result Date: 12/31/2016 CLINICAL DATA:  Left flank pain. EXAM: CT ABDOMEN AND PELVIS WITHOUT CONTRAST TECHNIQUE: Multidetector CT imaging of the abdomen and pelvis  was performed following the standard protocol without IV contrast. COMPARISON:  CT 01/28/2016 FINDINGS: Lower chest: Very faint reticulonodular opacities in the left lower lobe. No confluent airspace disease. No pleural fluid. Hepatobiliary: Hepatomegaly and hepatic steatosis. Liver spans 21.3 cm cranial caudal. Clips in the gallbladder fossa postcholecystectomy. No biliary dilatation. Pancreas: No ductal dilatation or inflammation. Spleen: Normal in size without focal abnormality. Adrenals/Urinary Tract: No adrenal nodule. Nonobstructing stone in the mid upper left kidney is increased in size from prior exam currently measuring 10 mm. No left hydronephrosis or perinephric edema. There is an 8 mm stone in the right renal pelvis that is new. There is prominence of the right renal pelvis without frank hydronephrosis. Additional punctate nonobstructing stone in the lower right kidney. Low-density lesions within lower poles of both kidneys are incompletely characterized  without contrast but likely cysts. Both ureters appear decompressed without stone. Urinary bladder is minimally distended without stone. Stomach/Bowel: Stomach is within normal limits. Appendix appears normal. No evidence of bowel wall thickening, distention, or inflammatory changes. Vascular/Lymphatic: Trace abdominal aortic atherosclerosis. No abdominal or pelvic adenopathy. Reproductive: Uterus is unremarkable. Linear metallic density about the left fallopian tube may be surgical suture or tubal occlusion device. No adnexal mass. Other: No free air, free fluid, or intra-abdominal fluid collection. Musculoskeletal: There are no acute or suspicious osseous abnormalities. Degenerative change with facet arthropathy in the lumbar spine. There 4 non-rib-bearing lumbar vertebra. IMPRESSION: 1. Bilateral renal calculi, which I have increased in size from prior CT. No left hydronephrosis to explain left flank pain. There is an 8 mm stone in the right renal pelvis without hydronephrosis. 2. Hepatomegaly and hepatic steatosis. 3. Faint reticulonodular opacities at the left lung base may be mild bronchiolitis, likely infectious. Electronically Signed   By: Jeb Levering M.D.   On: 12/31/2016 23:15    Procedures Procedures (including critical care time)  Medications Ordered in ED Medications  0.9 %  sodium chloride infusion (not administered)  sodium chloride 0.9 % bolus 1,000 mL (not administered)  ondansetron (ZOFRAN) injection 4 mg (not administered)  LORazepam (ATIVAN) injection 1 mg (not administered)  morphine 4 MG/ML injection 4 mg (not administered)  ondansetron (ZOFRAN-ODT) disintegrating tablet 4 mg (4 mg Oral Given 01/01/17 2118)     Initial Impression / Assessment and Plan / ED Course  I have reviewed the triage vital signs and the nursing notes.  Pertinent labs & imaging results that were available during my care of the patient were reviewed by me and considered in my medical decision making  (see chart for details).  Clinical Course     Patient given IV fluids and pain meds here feels better. Follow-up with her GI doctor.  Final Clinical Impressions(s) / ED Diagnoses   Final diagnoses:  None    New Prescriptions New Prescriptions   No medications on file     Carol Leigh, MD 01/01/17 2308

## 2017-01-08 ENCOUNTER — Ambulatory Visit: Payer: Self-pay | Admitting: Internal Medicine

## 2017-01-09 ENCOUNTER — Telehealth: Payer: Self-pay | Admitting: Internal Medicine

## 2017-01-09 NOTE — Telephone Encounter (Signed)
No show

## 2017-03-09 ENCOUNTER — Encounter (HOSPITAL_COMMUNITY): Payer: Self-pay | Admitting: *Deleted

## 2017-03-09 ENCOUNTER — Emergency Department (HOSPITAL_COMMUNITY): Payer: Medicaid Other

## 2017-03-09 ENCOUNTER — Emergency Department (HOSPITAL_COMMUNITY)
Admission: EM | Admit: 2017-03-09 | Discharge: 2017-03-09 | Disposition: A | Discharge status: Home / Self Care | Payer: Medicaid Other | Attending: Emergency Medicine | Admitting: Emergency Medicine

## 2017-03-09 DIAGNOSIS — Z91040 Latex allergy status: Secondary | ICD-10-CM | POA: Insufficient documentation

## 2017-03-09 DIAGNOSIS — Z85841 Personal history of malignant neoplasm of brain: Secondary | ICD-10-CM | POA: Diagnosis not present

## 2017-03-09 DIAGNOSIS — R3129 Other microscopic hematuria: Secondary | ICD-10-CM | POA: Insufficient documentation

## 2017-03-09 DIAGNOSIS — R110 Nausea: Secondary | ICD-10-CM | POA: Insufficient documentation

## 2017-03-09 DIAGNOSIS — R1011 Right upper quadrant pain: Secondary | ICD-10-CM | POA: Diagnosis not present

## 2017-03-09 DIAGNOSIS — J45909 Unspecified asthma, uncomplicated: Secondary | ICD-10-CM | POA: Diagnosis not present

## 2017-03-09 DIAGNOSIS — Z79899 Other long term (current) drug therapy: Secondary | ICD-10-CM | POA: Diagnosis not present

## 2017-03-09 DIAGNOSIS — I10 Essential (primary) hypertension: Secondary | ICD-10-CM | POA: Insufficient documentation

## 2017-03-09 DIAGNOSIS — R109 Unspecified abdominal pain: Secondary | ICD-10-CM

## 2017-03-09 DIAGNOSIS — N200 Calculus of kidney: Secondary | ICD-10-CM | POA: Diagnosis not present

## 2017-03-09 LAB — COMPREHENSIVE METABOLIC PANEL
ALT: 11 U/L — AB (ref 14–54)
AST: 14 U/L — ABNORMAL LOW (ref 15–41)
Albumin: 3.7 g/dL (ref 3.5–5.0)
Alkaline Phosphatase: 96 U/L (ref 38–126)
Anion gap: 5 (ref 5–15)
BUN: 11 mg/dL (ref 6–20)
CHLORIDE: 104 mmol/L (ref 101–111)
CO2: 29 mmol/L (ref 22–32)
CREATININE: 0.78 mg/dL (ref 0.44–1.00)
Calcium: 9 mg/dL (ref 8.9–10.3)
GFR calc non Af Amer: >60 mL/min (ref >60)
Glucose, Bld: 111 mg/dL — ABNORMAL HIGH (ref 65–99)
Potassium: 4.1 mmol/L (ref 3.5–5.1)
SODIUM: 138 mmol/L (ref 135–145)
Total Bilirubin: 0.8 mg/dL (ref 0.3–1.2)
Total Protein: 7.6 g/dL (ref 6.5–8.1)

## 2017-03-09 LAB — URINALYSIS, ROUTINE W REFLEX MICROSCOPIC
BACTERIA UA: NONE SEEN
Bilirubin Urine: NEGATIVE
GLUCOSE, UA: NEGATIVE mg/dL
KETONES UR: NEGATIVE mg/dL
Nitrite: NEGATIVE
PH: 5 (ref 5.0–8.0)
PROTEIN: 30 mg/dL — AB
Specific Gravity, Urine: 1.02 (ref 1.005–1.030)

## 2017-03-09 LAB — CBC
HEMATOCRIT: 41.4 % (ref 36.0–46.0)
HEMOGLOBIN: 13.1 g/dL (ref 12.0–15.0)
MCH: 27.5 pg (ref 26.0–34.0)
MCHC: 31.6 g/dL (ref 30.0–36.0)
MCV: 87 fL (ref 78.0–100.0)
Platelets: 314 10*3/uL (ref 150–400)
RBC: 4.76 MIL/uL (ref 3.87–5.11)
RDW: 14.2 % (ref 11.5–15.5)
WBC: 7.2 10*3/uL (ref 4.0–10.5)

## 2017-03-09 LAB — POC URINE PREG, ED: Preg Test, Ur: NEGATIVE

## 2017-03-09 LAB — LIPASE, BLOOD: LIPASE: 19 U/L (ref 11–51)

## 2017-03-09 MED ORDER — KETOROLAC TROMETHAMINE 30 MG/ML IJ SOLN
30.0000 mg | Freq: Once | INTRAMUSCULAR | Status: AC
  Administered 2017-03-09: 30 mg via INTRAMUSCULAR
  Filled 2017-03-09: qty 1

## 2017-03-09 MED ORDER — ONDANSETRON 8 MG PO TBDP
8.0000 mg | ORAL_TABLET | Freq: Once | ORAL | Status: AC
  Administered 2017-03-09: 8 mg via ORAL
  Filled 2017-03-09: qty 1

## 2017-03-09 MED ORDER — ONDANSETRON 4 MG PO TBDP: 4.0000 mg | ORAL_TABLET | Freq: Three times a day (TID) | ORAL | 0 refills | Status: DC | PRN

## 2017-03-09 MED ORDER — NAPROXEN 500 MG PO TABS: 500.0000 mg | ORAL_TABLET | Freq: Two times a day (BID) | ORAL | 0 refills | Status: DC | PRN

## 2017-03-09 NOTE — ED Notes (Signed)
Pt reports R flank pain that started x 2 weeks ago, a week later the pain started to radiate to her RUQ.  Pt reports hx of kidney stones.  Pt also reports constant nausea-denies vomiting.

## 2017-03-09 NOTE — Discharge Instructions (Signed)
Take naprosyn as directed as needed for pain using tylenol for breakthrough pain. Use Zofran as needed for nausea. Strain all urine to try to catch the stone when it passes. Follow-up with your urologist in the next 1 to 2 weeks for recheck of ongoing pain, and with your primary care doctor in the next 1-2 weeks for recheck of symptoms and ongoing management of your complaints; however for intractable or uncontrollable pain at home then return to the [facility] emergency department.

## 2017-03-09 NOTE — ED Triage Notes (Signed)
Pt complains of right sided abdomen and flank pain x 2-3 weeks. Pt states she has been on abx for 2 weeks for h. Pylori.

## 2017-03-09 NOTE — ED Provider Notes (Signed)
[facility] DEPT Provider Note   CSN: [id number] Arrival date & time: 03/09/17  1707     History   Chief Complaint Chief Complaint  Patient presents with  . Abdominal Pain    HPI Carol Ortega is a 46 y.o. female with a PMHx of nephrolithiasis, brain tumor s/p radiation, HTN, asthma, migraines, anemia, anxiety/depression, and other medical conditions listed below, with a PSHx of kidney stone removal, cystoscopy w/ ureteroscopy and stent placement multiple times most recently 04/23/15 by Dr. Tresa Moore, lithotripsy, ectopic pregnancy surgery (unknown laterality), bilateral tubal ligation, cholecystectomy, and R parathyroidectomy 11/13/16, who presents to the ED with complaints of "I think I'm passing a kidney stone". Patient states that she's had multiple kidney stones past, and her current symptoms feel similar to that. She reports that over the last 2 weeks she's had gradually worsening right flank pain that she describes as 10/10 constant sharp right flank pain radiating to the right lateral abdomen and RUQ, worse with walking, improved mildly with laying on her left side, and unrelieved with Aleve. She reports associated nausea. She states that 2 weeks ago she saw her PCP and was diagnosed with H pylori, she is currently on a regimen and states that her epigastric pain has improved from that treatment, reports that her current symptoms are different than those symptoms were. She is perimenopausal and has irregular menses, but states that her menstrual cycle started today. She is sexually active with one female partner, protected with condoms. She denies recent travel, sick contacts, suspicious food intake, alcohol use, or chronic NSAID use.   She also denies fevers, chills, CP, SOB, V/D/C, obstipation, melena, hematochezia, hematuria, dysuria, malodorous urine, urinary frequency/urgency, vaginal discharge, myalgias, arthralgias, numbness, tingling, focal weakness, or any other complaints at this  time.    The history is provided by the patient and medical records. No language interpreter was used.  Flank Pain  This is a recurrent problem. The current episode started more than 1 week ago. The problem occurs constantly. The problem has been gradually worsening. Associated symptoms include abdominal pain. Pertinent negatives include no chest pain and no shortness of breath. The symptoms are aggravated by walking. The symptoms are relieved by lying down (on L side). Treatments tried: aleve. The treatment provided no relief.    Past Medical History:  Diagnosis Date  . Anemia   . Anxiety   . Asthma    seasonal  . Brain tumor ([place]) 2014   tx with radiation  . Cancer ([place])   . Complication of anesthesia 1999   lung collapse after surgery for gallbladder  . Depression   . Headache    migraines  . Hoarseness of voice   . Hypertension   . Kidney stone   . Obesity   . Parathyroid abnormality ([place])   . Pulmonary emboli ([place]) 2011  . Radiation    Brain tumor  . UTI (urinary tract infection)   . Vitamin D deficiency     Patient Active Problem List   Diagnosis Date Noted  . Hyperparathyroidism ([place]) 09/10/2015  . Vitamin D deficiency 06/25/2014  . Hypercalcemia 02/09/2014  . 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 CHOLECYSTECTOMY  1999  . CYSTOSCOPY W/ URETEROSCOPY    . CYSTOSCOPY WITH RETROGRADE PYELOGRAM, URETEROSCOPY AND STENT PLACEMENT Left 01/20/2014   Procedure: LEFT URETEROSCOPY WITH STENT PLACEMENT;  Surgeon: Irine Seal, MD;  Location: [facility];  Service: Urology;  Laterality: Left;  . CYSTOSCOPY  WITH RETROGRADE PYELOGRAM, URETEROSCOPY AND STENT PLACEMENT Bilateral 04/23/2015   Procedure: CYSTOSCOPY WITH BILATERAL RETROGRADE PYELOGRAM, URETEROSCOPY AND STENT PLACEMENT;  Surgeon: Alexis Frock, MD;  Location[facility];  Service: Urology;  Laterality: Bilateral;  . CYSTOSCOPY WITH STENT PLACEMENT Left 01/12/2014    Procedure: CYSTOSCOPY WITH STENT PLACEMENT left retrograde;  Surgeon: Irine Seal, MD;  [facility];  Service: Urology;  Laterality: Left;  . HOLMIUM LASER APPLICATION Left 03/21/3153   Procedure: HOLMIUM LASER APPLICATION;  Surgeon: Irine Seal, MD;  [facility];  Service: Urology;  Laterality: Left;  . HOLMIUM LASER APPLICATION Bilateral 0/0/8676   Procedure: HOLMIUM LASER APPLICATION;  Surgeon: Alexis Frock, MD;  [facility];  Service: Urology;  Laterality: Bilateral;  . kidney stone removal    . LITHOTRIPSY    . PARATHYROIDECTOMY Right 11/13/2016   Procedure: RIGHT SUPERIOR PARATHYROIDECTOMY;  Surgeon: Armandina Gemma, MD;  Location: [facility];  Service: General;  Laterality: Right;  . surgery for,ectopic pregnancy  2011    1 fallopian tube rupture  . TUBAL LIGATION  2011    OB History    No data available       Home Medications    Prior to Admission medications   Medication Sig Start Date End Date Taking? Authorizing Provider  albuterol (PROVENTIL) (2.5 MG/3ML) 0.083% nebulizer solution Inhale 2.5 mg into the lungs every 6 (six) hours as needed for shortness of breath. 06/26/16 06/26/17  Historical Provider, MD  HYDROcodone-acetaminophen (NORCO/VICODIN) 5-325 MG tablet Take 1-2 tablets by mouth every 4 (four) hours as needed for moderate pain. Patient not taking: Reported on 12/31/2016 11/13/16   Armandina Gemma, MD  lisinopril-hydrochlorothiazide (PRINZIDE,ZESTORETIC) 20-12.5 MG per tablet Take 1 tablet by mouth daily.     Historical Provider, MD  polyethylene glycol (MIRALAX) packet Take 17 g by mouth daily. 12/31/16   Okey Regal, PA-C  PROVENTIL HFA 108 (90 Base) MCG/ACT inhaler Inhale 2 puffs into the lungs every 6 (six) hours as needed for wheezing or shortness of breath.  06/12/16   Historical Provider, MD    Family History Family History  Problem Relation Age of Onset  . Bell's palsy Mother   . Heart failure Mother   . Asthma Father   . Hypertension Father      Social History Social History  Substance Use Topics  . Smoking status: Never Smoker  . Smokeless tobacco: Never Used  . Alcohol use Yes     Comment: holidays     Allergies   Bee venom; Eggs or egg-derived products; Other; Gadobenate; Penicillins; Latex; Oxycodone-acetaminophen; and Penicillin g   Review of Systems Review of Systems  Constitutional: Negative for chills and fever.  Respiratory: Negative for shortness of breath.   Cardiovascular: Negative for chest pain.  Gastrointestinal: Positive for abdominal pain and nausea. Negative for blood in stool, constipation, diarrhea and vomiting.  Genitourinary: Positive for flank pain. Negative for dysuria, frequency, hematuria, urgency and vaginal discharge. Vaginal bleeding: on menses.       No malodorous urine  Musculoskeletal: Negative for arthralgias and myalgias.  Skin: Negative for color change.  Allergic/Immunologic: Negative for immunocompromised state.  Neurological: Negative for weakness and numbness.  Psychiatric/Behavioral: Negative for confusion.   10 Systems reviewed and are negative for acute change except as noted in the HPI.   Physical Exam Updated Vital Signs BP (!) 158/99 (BP Location: Left Arm)   Pulse 85   Temp 97.8 F (36.6 C) (Oral)   Resp 20   SpO2 98%  Physical Exam  Constitutional: She is oriented to person, place, and time. Vital signs are normal. She appears well-developed and well-nourished.  Non-toxic appearance. No distress.  Afebrile, nontoxic, NAD  HENT:  Head: Normocephalic and atraumatic.  Mouth/Throat: Oropharynx is clear and moist and mucous membranes are normal.  Eyes: Conjunctivae and EOM are normal. Right eye exhibits no discharge. Left eye exhibits no discharge.  Neck: Normal range of motion. Neck supple.  Cardiovascular: Normal rate, regular rhythm, normal heart sounds and intact distal pulses.  Exam reveals no gallop and no friction rub.   No murmur heard. Pulmonary/Chest:  Effort normal and breath sounds normal. No respiratory distress. She has no decreased breath sounds. She has no wheezes. She has no rhonchi. She has no rales.  Abdominal: Soft. Normal appearance and bowel sounds are normal. She exhibits no distension. There is tenderness in the right upper quadrant. There is CVA tenderness. There is no rigidity, no rebound, no guarding, no tenderness at McBurney's point and negative Murphy's sign.    Soft, obese which limits exam slightly however overall without obvious distension, +BS throughout, with mild R lateral/RUQ TTP tracking towards the R flank region, no r/g/r, neg murphy's, neg mcburney's, with mild +R sided CVA TTP   Musculoskeletal: Normal range of motion.  Neurological: She is alert and oriented to person, place, and time. She has normal strength. No sensory deficit.  Skin: Skin is warm, dry and intact. No rash noted.  Psychiatric: She has a normal mood and affect.  Nursing note and vitals reviewed.    ED Treatments / Results  Labs (all labs ordered are listed, but only abnormal results are displayed) Labs Reviewed  COMPREHENSIVE METABOLIC PANEL - Abnormal; Notable for the following:       Result Value   Glucose, Bld 111 (*)    AST 14 (*)    ALT 11 (*)    All other components within normal limits  URINALYSIS, ROUTINE W REFLEX MICROSCOPIC - Abnormal; Notable for the following:    Hgb urine dipstick LARGE (*)    Protein, ur 30 (*)    Leukocytes, UA TRACE (*)    Squamous Epithelial / LPF 0-5 (*)    All other components within normal limits  URINE CULTURE  LIPASE, BLOOD  CBC  POC URINE PREG, ED    EKG  EKG Interpretation None       Radiology Dg Abdomen 1 View  Result Date: 03/09/2017 CLINICAL DATA:  Right flank pain. EXAM: ABDOMEN - 1 VIEW COMPARISON:  Radiographs of January 01, 2017. FINDINGS: The bowel gas pattern is normal. Status post cholecystectomy. Stable bilateral nephrolithiasis. Phlebolith is noted in the pelvis.  IMPRESSION: Bilateral nephrolithiasis. No evidence of bowel obstruction or ileus. Electronically Signed   By: Marijo Conception, M.D.   On: 03/09/2017 20:54    Procedures Procedures (including critical care time)  Medications Ordered in ED Medications  ondansetron (ZOFRAN-ODT) disintegrating tablet 8 mg (8 mg Oral Given 03/09/17 2014)  ketorolac (TORADOL) 30 MG/ML injection 30 mg (30 mg Intramuscular Given 03/09/17 2014)     Initial Impression / Assessment and Plan / ED Course  I have reviewed the triage vital signs and the nursing notes.  Pertinent labs & imaging results that were available during my care of the patient were reviewed by me and considered in my medical decision making (see chart for details).     46 y.o. female here with R flank pain x2 weeks, states it feels like prior kidney stones.  Some mild nausea as well. Was recently started on H.pylori regimen and states her epigastric pain has improved, reports this is different, and feels like a kidney stone. On exam, mild R CVA TTP, mild RUQ/R lateral abd TTP tracking towards the R flank, nonperitoneal, obese which somewhat limits exam. Labs reveal: CBC WNL, CMP WNL, lipase WNL, Upreg neg, U/A with TNTC RBCs, 0-5 squamous, 6-30 WBC however no bacteria seen; WBCs likely from ureteral/urethral irritation from ?kidney stone, less likely due to UTI; no symptoms of UTI, will send for culture but doubt need for empiric tx. Symptoms most consistent with kidney stone. Will get KUB xray to see if kidney stone is present and/or too big to pass. Will give zofran and toradol then reassess shortly.  9:15 PM KUB showing stable b/l nephrolithiasis. Pt feeling slightly better. Overall symptoms/presentation likely related to kidney stone, she could have passed a small part of the stone that's in the kidney, or could just be having symptomatic renal colic despite the fact that the stone appears stable and unchanged; will start on naprosyn and rx zofran as  well, will give urine strainer; dietary guidelines for kidney stones advised. Discussed use of tylenol. Advised pt f/up with her urologist and with PCP in 1-2wks for recheck of symptoms and ongoing management of her recurrent/chronic R flank/abd pain. Pt already having some issues with lightheadedness recently, therefore will hold off on giving flomax. I explained the diagnosis and have given explicit precautions to return to the ER including for any other new or worsening symptoms. The patient understands and accepts the medical plan as it's been dictated and I have answered their questions. Discharge instructions concerning home care and prescriptions have been given. The patient is STABLE and is discharged to home in good condition.   Final Clinical Impressions(s) / ED Diagnoses   Final diagnoses:  Right flank pain  Right upper quadrant abdominal pain  Nausea  Other microscopic hematuria  Nephrolithiasis    New Prescriptions New Prescriptions   NAPROXEN (NAPROSYN) 500 MG TABLET    Take 1 tablet (500 mg total) by mouth 2 (two) times daily as needed for mild pain, moderate pain or headache (TAKE WITH MEALS.).   ONDANSETRON (ZOFRAN ODT) 4 MG DISINTEGRATING TABLET    Take 1 tablet (4 mg total) by mouth every 8 (eight) hours as needed for nausea or vomiting.     [address] PA-C 03/09/17 2116    Forde Dandy, MD 03/10/17 [phone]

## 2017-03-11 LAB — URINE CULTURE: Culture: <10000 — AB

## 2017-04-08 ENCOUNTER — Emergency Department (HOSPITAL_COMMUNITY): Payer: Medicaid Other

## 2017-04-08 ENCOUNTER — Ambulatory Visit (HOSPITAL_COMMUNITY)
Admission: EM | Admit: 2017-04-08 | Discharge: 2017-04-09 | Disposition: A | Discharge status: Home / Self Care | Payer: Medicaid Other | Attending: Urology | Admitting: Urology

## 2017-04-08 ENCOUNTER — Emergency Department (HOSPITAL_COMMUNITY): Payer: Medicaid Other | Admitting: Certified Registered Nurse Anesthetist

## 2017-04-08 ENCOUNTER — Encounter (HOSPITAL_COMMUNITY)
Admission: EM | Disposition: A | Discharge status: Home / Self Care | Payer: Self-pay | Source: Home / Self Care | Attending: Emergency Medicine

## 2017-04-08 ENCOUNTER — Encounter (HOSPITAL_COMMUNITY): Payer: Self-pay | Admitting: Emergency Medicine

## 2017-04-08 DIAGNOSIS — E892 Postprocedural hypoparathyroidism: Secondary | ICD-10-CM | POA: Insufficient documentation

## 2017-04-08 DIAGNOSIS — N132 Hydronephrosis with renal and ureteral calculous obstruction: Secondary | ICD-10-CM | POA: Diagnosis not present

## 2017-04-08 DIAGNOSIS — I10 Essential (primary) hypertension: Secondary | ICD-10-CM | POA: Diagnosis not present

## 2017-04-08 DIAGNOSIS — E559 Vitamin D deficiency, unspecified: Secondary | ICD-10-CM | POA: Diagnosis not present

## 2017-04-08 DIAGNOSIS — Z923 Personal history of irradiation: Secondary | ICD-10-CM | POA: Diagnosis not present

## 2017-04-08 DIAGNOSIS — Z6841 Body Mass Index (BMI) 40.0 and over, adult: Secondary | ICD-10-CM | POA: Diagnosis not present

## 2017-04-08 DIAGNOSIS — R490 Dysphonia: Secondary | ICD-10-CM | POA: Diagnosis not present

## 2017-04-08 DIAGNOSIS — Z885 Allergy status to narcotic agent status: Secondary | ICD-10-CM | POA: Diagnosis not present

## 2017-04-08 DIAGNOSIS — D649 Anemia, unspecified: Secondary | ICD-10-CM | POA: Insufficient documentation

## 2017-04-08 DIAGNOSIS — Z820 Family history of epilepsy and other diseases of the nervous system: Secondary | ICD-10-CM | POA: Diagnosis not present

## 2017-04-08 DIAGNOSIS — Z888 Allergy status to other drugs, medicaments and biological substances status: Secondary | ICD-10-CM | POA: Diagnosis not present

## 2017-04-08 DIAGNOSIS — Z91040 Latex allergy status: Secondary | ICD-10-CM | POA: Insufficient documentation

## 2017-04-08 DIAGNOSIS — Z85841 Personal history of malignant neoplasm of brain: Secondary | ICD-10-CM | POA: Insufficient documentation

## 2017-04-08 DIAGNOSIS — F329 Major depressive disorder, single episode, unspecified: Secondary | ICD-10-CM | POA: Insufficient documentation

## 2017-04-08 DIAGNOSIS — Z91030 Bee allergy status: Secondary | ICD-10-CM | POA: Insufficient documentation

## 2017-04-08 DIAGNOSIS — Z8249 Family history of ischemic heart disease and other diseases of the circulatory system: Secondary | ICD-10-CM | POA: Insufficient documentation

## 2017-04-08 DIAGNOSIS — Z880 Allergy status to penicillin: Secondary | ICD-10-CM | POA: Diagnosis not present

## 2017-04-08 DIAGNOSIS — J45909 Unspecified asthma, uncomplicated: Secondary | ICD-10-CM | POA: Diagnosis not present

## 2017-04-08 DIAGNOSIS — Z825 Family history of asthma and other chronic lower respiratory diseases: Secondary | ICD-10-CM | POA: Diagnosis not present

## 2017-04-08 DIAGNOSIS — Z91012 Allergy to eggs: Secondary | ICD-10-CM | POA: Insufficient documentation

## 2017-04-08 DIAGNOSIS — F419 Anxiety disorder, unspecified: Secondary | ICD-10-CM | POA: Diagnosis not present

## 2017-04-08 DIAGNOSIS — G43909 Migraine, unspecified, not intractable, without status migrainosus: Secondary | ICD-10-CM | POA: Insufficient documentation

## 2017-04-08 DIAGNOSIS — N201 Calculus of ureter: Secondary | ICD-10-CM

## 2017-04-08 DIAGNOSIS — Z86711 Personal history of pulmonary embolism: Secondary | ICD-10-CM | POA: Insufficient documentation

## 2017-04-08 DIAGNOSIS — N200 Calculus of kidney: Secondary | ICD-10-CM | POA: Diagnosis present

## 2017-04-08 DIAGNOSIS — Z9049 Acquired absence of other specified parts of digestive tract: Secondary | ICD-10-CM | POA: Insufficient documentation

## 2017-04-08 HISTORY — PX: CYSTOSCOPY W/ URETERAL STENT PLACEMENT: SHX1429

## 2017-04-08 LAB — CBC WITH DIFFERENTIAL/PLATELET
BASOS PCT: 1 %
Basophils Absolute: 0 10*3/uL (ref 0.0–0.1)
EOS ABS: 0.3 10*3/uL (ref 0.0–0.7)
EOS PCT: 4 %
HCT: 43.9 % (ref 36.0–46.0)
HEMOGLOBIN: 14.1 g/dL (ref 12.0–15.0)
Lymphocytes Relative: 40 %
Lymphs Abs: 2.9 10*3/uL (ref 0.7–4.0)
MCH: 28 pg (ref 26.0–34.0)
MCHC: 32.1 g/dL (ref 30.0–36.0)
MCV: 87.3 fL (ref 78.0–100.0)
MONOS PCT: 9 %
Monocytes Absolute: 0.6 10*3/uL (ref 0.1–1.0)
NEUTROS PCT: 46 %
Neutro Abs: 3.4 10*3/uL (ref 1.7–7.7)
PLATELETS: 315 10*3/uL (ref 150–400)
RBC: 5.03 MIL/uL (ref 3.87–5.11)
RDW: 14.4 % (ref 11.5–15.5)
WBC: 7.3 10*3/uL (ref 4.0–10.5)

## 2017-04-08 LAB — COMPREHENSIVE METABOLIC PANEL
ALBUMIN: 3.8 g/dL (ref 3.5–5.0)
ALK PHOS: 95 U/L (ref 38–126)
ALT: 12 U/L — ABNORMAL LOW (ref 14–54)
ANION GAP: 8 (ref 5–15)
AST: 16 U/L (ref 15–41)
BUN: 9 mg/dL (ref 6–20)
CALCIUM: 9 mg/dL (ref 8.9–10.3)
CHLORIDE: 104 mmol/L (ref 101–111)
CO2: 27 mmol/L (ref 22–32)
Creatinine, Ser: 0.86 mg/dL (ref 0.44–1.00)
GFR calc non Af Amer: >60 mL/min (ref >60)
GLUCOSE: 91 mg/dL (ref 65–99)
Potassium: 3.6 mmol/L (ref 3.5–5.1)
SODIUM: 139 mmol/L (ref 135–145)
Total Bilirubin: 0.9 mg/dL (ref 0.3–1.2)
Total Protein: 8.1 g/dL (ref 6.5–8.1)

## 2017-04-08 LAB — URINALYSIS, ROUTINE W REFLEX MICROSCOPIC
BILIRUBIN URINE: NEGATIVE
Glucose, UA: NEGATIVE mg/dL
KETONES UR: NEGATIVE mg/dL
NITRITE: NEGATIVE
Protein, ur: 100 mg/dL — AB
Specific Gravity, Urine: 1.025 (ref 1.005–1.030)
pH: 6 (ref 5.0–8.0)

## 2017-04-08 LAB — PREGNANCY, URINE: Preg Test, Ur: NEGATIVE

## 2017-04-08 SURGERY — CYSTOSCOPY, WITH RETROGRADE PYELOGRAM AND URETERAL STENT INSERTION
Anesthesia: General | Laterality: Right

## 2017-04-08 MED ORDER — KETOROLAC TROMETHAMINE 30 MG/ML IJ SOLN
30.0000 mg | Freq: Once | INTRAMUSCULAR | Status: AC
  Administered 2017-04-08: 30 mg via INTRAVENOUS
  Filled 2017-04-08: qty 1

## 2017-04-08 MED ORDER — HYDROMORPHONE HCL 1 MG/ML IJ SOLN
1.0000 mg | INTRAMUSCULAR | Status: AC | PRN
  Administered 2017-04-08 (×2): 1 mg via INTRAVENOUS
  Filled 2017-04-08 (×3): qty 1

## 2017-04-08 MED ORDER — LEVOFLOXACIN IN D5W 750 MG/150ML IV SOLN
750.0000 mg | Freq: Once | INTRAVENOUS | Status: AC
  Administered 2017-04-08: 750 mg via INTRAVENOUS
  Filled 2017-04-08: qty 150

## 2017-04-08 MED ORDER — ACETAMINOPHEN 160 MG/5ML PO SOLN: 325.0000 mg | ORAL | Status: DC | PRN

## 2017-04-08 MED ORDER — HYDROMORPHONE HCL 1 MG/ML IJ SOLN
1.0000 mg | INTRAMUSCULAR | Status: AC | PRN
  Administered 2017-04-08: 1 mg via INTRAVENOUS
  Filled 2017-04-08: qty 1

## 2017-04-08 MED ORDER — PROPOFOL 10 MG/ML IV BOLUS
INTRAVENOUS | Status: AC
  Filled 2017-04-08: qty 20

## 2017-04-08 MED ORDER — ACETAMINOPHEN 325 MG PO TABS: 325.0000 mg | ORAL_TABLET | ORAL | Status: DC | PRN

## 2017-04-08 MED ORDER — ONDANSETRON HCL 4 MG/2ML IJ SOLN: 4.0000 mg | Freq: Four times a day (QID) | INTRAMUSCULAR | Status: DC | PRN

## 2017-04-08 MED ORDER — CIPROFLOXACIN IN D5W 400 MG/200ML IV SOLN
INTRAVENOUS | Status: AC
  Filled 2017-04-08: qty 200

## 2017-04-08 MED ORDER — FENTANYL CITRATE (PF) 100 MCG/2ML IJ SOLN
INTRAMUSCULAR | Status: AC
  Filled 2017-04-08: qty 2

## 2017-04-08 MED ORDER — CIPROFLOXACIN IN D5W 400 MG/200ML IV SOLN: 400.0000 mg | INTRAVENOUS | Status: DC

## 2017-04-08 MED ORDER — DEXAMETHASONE SODIUM PHOSPHATE 10 MG/ML IJ SOLN
INTRAMUSCULAR | Status: AC
  Filled 2017-04-08: qty 1

## 2017-04-08 MED ORDER — SODIUM CHLORIDE 0.9 % IV BOLUS (SEPSIS)
500.0000 mL | Freq: Once | INTRAVENOUS | Status: AC
  Administered 2017-04-08: 500 mL via INTRAVENOUS

## 2017-04-08 MED ORDER — MIDAZOLAM HCL 2 MG/2ML IJ SOLN
INTRAMUSCULAR | Status: AC
  Filled 2017-04-08: qty 2

## 2017-04-08 MED ORDER — FENTANYL CITRATE (PF) 100 MCG/2ML IJ SOLN
25.0000 ug | INTRAMUSCULAR | Status: DC | PRN
  Administered 2017-04-09: 25 ug via INTRAVENOUS

## 2017-04-08 MED ORDER — ONDANSETRON HCL 4 MG/2ML IJ SOLN
4.0000 mg | Freq: Once | INTRAMUSCULAR | Status: AC
  Administered 2017-04-08: 4 mg via INTRAVENOUS
  Filled 2017-04-08: qty 2

## 2017-04-08 MED ORDER — SODIUM CHLORIDE 0.9 % IV SOLN
Freq: Once | INTRAVENOUS | Status: AC
  Administered 2017-04-08: 18:00:00 via INTRAVENOUS

## 2017-04-08 MED ORDER — FENTANYL CITRATE (PF) 100 MCG/2ML IJ SOLN
50.0000 ug | INTRAMUSCULAR | Status: DC | PRN
  Administered 2017-04-08: 50 ug via INTRAVENOUS
  Filled 2017-04-08: qty 2

## 2017-04-08 MED ORDER — TAMSULOSIN HCL 0.4 MG PO CAPS
0.4000 mg | ORAL_CAPSULE | Freq: Once | ORAL | Status: AC
  Administered 2017-04-08: 0.4 mg via ORAL
  Filled 2017-04-08: qty 1

## 2017-04-08 MED ORDER — LIDOCAINE 2% (20 MG/ML) 5 ML SYRINGE
INTRAMUSCULAR | Status: AC
  Filled 2017-04-08: qty 5

## 2017-04-08 MED ORDER — ONDANSETRON HCL 4 MG/2ML IJ SOLN
INTRAMUSCULAR | Status: AC
  Filled 2017-04-08: qty 2

## 2017-04-08 MED ORDER — FENTANYL CITRATE (PF) 100 MCG/2ML IJ SOLN: 50.0000 ug | INTRAMUSCULAR | Status: DC | PRN

## 2017-04-08 SURGICAL SUPPLY — 19 items
BAG URO CATCHER STRL LF (MISCELLANEOUS) ×3 IMPLANT
BASKET ZERO TIP NITINOL 2.4FR (BASKET) IMPLANT
CATH INTERMIT  6FR 70CM (CATHETERS) IMPLANT
CATH URET 5FR 28IN CONE TIP (BALLOONS)
CATH URET 5FR 70CM CONE TIP (BALLOONS) IMPLANT
CLOTH BEACON ORANGE TIMEOUT ST (SAFETY) ×3 IMPLANT
COVER SURGICAL LIGHT HANDLE (MISCELLANEOUS) ×3 IMPLANT
GLOVE BIOGEL PI IND STRL 6.5 (GLOVE) ×1 IMPLANT
GLOVE BIOGEL PI INDICATOR 6.5 (GLOVE) ×2
GLOVE INDICATOR 8.0 STRL GRN (GLOVE) ×3 IMPLANT
GOWN STRL REUS W/TWL LRG LVL3 (GOWN DISPOSABLE) ×3 IMPLANT
GOWN STRL REUS W/TWL XL LVL3 (GOWN DISPOSABLE) ×3 IMPLANT
GUIDEWIRE STR DUAL SENSOR (WIRE) ×3 IMPLANT
MANIFOLD NEPTUNE II (INSTRUMENTS) ×3 IMPLANT
PACK CYSTO (CUSTOM PROCEDURE TRAY) ×3 IMPLANT
STENT URET 6FRX24 CONTOUR (STENTS) ×3 IMPLANT
TUBING CONNECTING 10 (TUBING) IMPLANT
TUBING CONNECTING 10' (TUBING)
WIRE COONS/BENSON .038X145CM (WIRE) ×3 IMPLANT

## 2017-04-08 NOTE — H&P (Signed)
Carol Ortega is an 46 y.o. female.   Chief Complaint: Right flank pain with 12 mm r upj stone HPI: Carol Ortega has an extensive history of nephrolithiasis.  She previously was under the care of Dr. Irine Seal and is more recently been taken care of by Dr. Phebe Colla.  She is currently 46 years of age.  In the past.  She has had multiple ureteroscopy is as well as shockwave lithotripsy.  Her most recent procedure by [facility] was in 2016.  She has been diagnosed with hyperparathyroidism and is status post parathyroidectomy by Dr. Dot Lanes.  She has had several months of intermittent discomfort.  Last night she had extremely severe right flank pain, which then eased and recurred this afternoon.  She has been in the [facility] now for 46 hours.  They did not feel they were able to her pain under control.  CT imaging revealed a 12 mm stone in her right renal pelvis/UPJ region.  There was mild hydronephrosis.  She has had no dysuria.  She has had no fever or chills.  She has had no gross hematuria.  There is been no other significant change in her overall medical history.  She does have a prior history of pulmonary embolus.  Past Medical History:  Diagnosis Date  . Anemia   . Anxiety   . Asthma    seasonal  . Brain tumor ([place]) 2014   tx with radiation  . Cancer ([place])   . Complication of anesthesia 1999   lung collapse after surgery for gallbladder  . Depression   . Headache    migraines  . Hoarseness of voice   . Hypertension   . Kidney stone   . Obesity   . Parathyroid abnormality ([place])   . Pulmonary emboli ([place]) 2011  . Radiation    Brain tumor  . UTI (urinary tract infection)   . Vitamin D deficiency     Past Surgical History:  Procedure Laterality Date  . CHOLECYSTECTOMY  1999  . CYSTOSCOPY W/ URETEROSCOPY    . CYSTOSCOPY WITH RETROGRADE PYELOGRAM, URETEROSCOPY AND STENT PLACEMENT Left 01/20/2014   Procedure: LEFT URETEROSCOPY WITH STENT PLACEMENT;  Surgeon:  Irine Seal, MD;  [facility];  Service: Urology;  Laterality: Left;  . CYSTOSCOPY WITH RETROGRADE PYELOGRAM, URETEROSCOPY AND STENT PLACEMENT Bilateral 04/23/2015   Procedure: CYSTOSCOPY WITH BILATERAL RETROGRADE PYELOGRAM, URETEROSCOPY AND STENT PLACEMENT;  Surgeon: Alexis Frock, MD;  [facility];  Service: Urology;  Laterality: Bilateral;  . CYSTOSCOPY WITH STENT PLACEMENT Left 01/12/2014   Procedure: CYSTOSCOPY WITH STENT PLACEMENT left retrograde;  Surgeon: Irine Seal, MD;  [facility];  Service: Urology;  Laterality: Left;  . HOLMIUM LASER APPLICATION Left 04/24/997   Procedure: HOLMIUM LASER APPLICATION;  Surgeon: Irine Seal, MD;  [facility];  Service: Urology;  Laterality: Left;  . HOLMIUM LASER APPLICATION Bilateral 02/17/8249   Procedure: HOLMIUM LASER APPLICATION;  Surgeon: Alexis Frock, MD;  [facility];  Service: Urology;  Laterality: Bilateral;  . kidney stone removal    . LITHOTRIPSY    . PARATHYROIDECTOMY Right 11/13/2016   Procedure: RIGHT SUPERIOR PARATHYROIDECTOMY;  Surgeon: Armandina Gemma, MD;  Location: [facility];  Service: General;  Laterality: Right;  . surgery for,ectopic pregnancy  2011    1 fallopian tube rupture  . TUBAL LIGATION  2011    Family History  Problem Relation Age of Onset  . Bell's palsy Mother   . Heart failure Mother   .  Asthma Father   . Hypertension Father    Social History:  reports that she has never smoked. She has never used smokeless tobacco. She reports that she drinks alcohol. She reports that she does not use drugs.  Allergies:  Allergies  Allergen Reactions  . Bee Venom Swelling  . Eggs Or Egg-Derived Products Anaphylaxis  . Other Anaphylaxis    Surgical dye.. Pt states ok to take if previously taken benadryl or prednisone  . Gadobenate Itching and Nausea Only    Itching in throat (contrast dye)  . Penicillins Hives and Swelling    Has patient had a PCN reaction causing immediate rash, facial/tongue/throat  swelling, SOB or lightheadedness with hypotension: yes Has patient had a PCN reaction causing severe rash involving mucus membranes or skin necrosis: no Has patient had a PCN reaction that required hospitalization no Has patient had a PCN reaction occurring within the last 10 years: no If all of the above answers are "NO", then may proceed with Cephalosporin use.   . Latex Rash  . Oxycodone-Acetaminophen Itching  . Penicillin G Rash     (Not in a hospital admission)  Results for orders placed or performed during the hospital encounter of 04/08/17 (from the past 48 hour(s))  Urinalysis, Routine w reflex microscopic- may I&O cath if menses     Status: Abnormal   Collection Time: 04/08/17  1:48 PM  Result Value Ref Range   Color, Urine YELLOW YELLOW   APPearance HAZY (A) CLEAR   Specific Gravity, Urine 1.025 1.005 - 1.030   pH 6.0 5.0 - 8.0   Glucose, UA NEGATIVE NEGATIVE mg/dL   Hgb urine dipstick LARGE (A) NEGATIVE   Bilirubin Urine NEGATIVE NEGATIVE   Ketones, ur NEGATIVE NEGATIVE mg/dL   Protein, ur 100 (A) NEGATIVE mg/dL   Nitrite NEGATIVE NEGATIVE   Leukocytes, UA TRACE (A) NEGATIVE   RBC / HPF TOO NUMEROUS TO COUNT 0 - 5 RBC/hpf   WBC, UA TOO NUMEROUS TO COUNT 0 - 5 WBC/hpf   Bacteria, UA RARE (A) NONE SEEN   Squamous Epithelial / LPF 0-5 (A) NONE SEEN   Mucous PRESENT   Pregnancy, urine     Status: None   Collection Time: 04/08/17  2:09 PM  Result Value Ref Range   Preg Test, Ur NEGATIVE NEGATIVE    Comment:        THE SENSITIVITY OF THIS METHODOLOGY IS >20 mIU/mL.   CBC with Differential/Platelet     Status: None   Collection Time: 04/08/17  5:16 PM  Result Value Ref Range   WBC 7.3 4.0 - 10.5 K/uL   RBC 5.03 3.87 - 5.11 MIL/uL   Hemoglobin 14.1 12.0 - 15.0 g/dL   HCT 43.9 36.0 - 46.0 %   MCV 87.3 78.0 - 100.0 fL   MCH 28.0 26.0 - 34.0 pg   MCHC 32.1 30.0 - 36.0 g/dL   RDW 14.4 11.5 - 15.5 %   Platelets 315 150 - 400 K/uL   Neutrophils Relative % 46 %    Neutro Abs 3.4 1.7 - 7.7 K/uL   Lymphocytes Relative 40 %   Lymphs Abs 2.9 0.7 - 4.0 K/uL   Monocytes Relative 9 %   Monocytes Absolute 0.6 0.1 - 1.0 K/uL   Eosinophils Relative 4 %   Eosinophils Absolute 0.3 0.0 - 0.7 K/uL   Basophils Relative 1 %   Basophils Absolute 0.0 0.0 - 0.1 K/uL  Comprehensive metabolic panel     Status: Abnormal  Collection Time: 04/08/17  5:16 PM  Result Value Ref Range   Sodium 139 135 - 145 mmol/L   Potassium 3.6 3.5 - 5.1 mmol/L   Chloride 104 101 - 111 mmol/L   CO2 27 22 - 32 mmol/L   Glucose, Bld 91 65 - 99 mg/dL   BUN 9 6 - 20 mg/dL   Creatinine, Ser 0.86 0.44 - 1.00 mg/dL   Calcium 9.0 8.9 - 10.3 mg/dL   Total Protein 8.1 6.5 - 8.1 g/dL   Albumin 3.8 3.5 - 5.0 g/dL   AST 16 15 - 41 U/L   ALT 12 (L) 14 - 54 U/L   Alkaline Phosphatase 95 38 - 126 U/L   Total Bilirubin 0.9 0.3 - 1.2 mg/dL   GFR calc non Af Amer >60 >60 mL/min   GFR calc Af Amer >60 >60 mL/min    Comment: (NOTE) The eGFR has been calculated using the CKD EPI equation. This calculation has not been validated in all clinical situations. eGFR's persistently <60 mL/min signify possible Chronic Kidney Disease.    Anion gap 8 5 - 15   Ct Renal Stone Study  Result Date: 04/08/2017 CLINICAL DATA:  46 year old female with history of right-sided flank pain for the past couple of weeks. Prior history of kidney stones. Decreased urination. EXAM: CT ABDOMEN AND PELVIS WITHOUT CONTRAST TECHNIQUE: Multidetector CT imaging of the abdomen and pelvis was performed following the standard protocol without IV contrast. COMPARISON:  CT the abdomen and pelvis 12/31/2016. FINDINGS: Lower chest: Unremarkable. Hepatobiliary: No definite cystic or solid hepatic lesions are identified on today's noncontrast CT examination. Status post cholecystectomy. Pancreas: No definite pancreatic mass or peripancreatic inflammatory changes are noted on today's noncontrast CT examination. Spleen: Unremarkable.  Adrenals/Urinary Tract: In the right renal pelvis there is a 12 mm calculus. This is associated with fullness in the right renal pelvis and very mild right hydronephrosis. Multiple additional nonobstructive calculi are present within the collecting systems of both kidneys, measuring up to 12 mm in the upper pole the left kidney. 22 mm low-attenuation lesion in the lower pole of the right kidney is incompletely characterize, but statistically likely a cyst. Left kidney and bilateral adrenal glands are otherwise unremarkable in appearance. No hydroureteronephrosis. Urinary bladder is normal in appearance. Stomach/Bowel: Unenhanced appearance of the stomach is normal. No pathologic dilatation of small bowel or colon. Normal appendix. Vascular/Lymphatic: Atherosclerotic calcifications in the pelvic vasculature. No definite aneurysm in the abdominal or pelvic vasculature. No lymphadenopathy noted in the abdomen or pelvis. Reproductive: Uterus and ovaries are unremarkable in appearance. Other: No significant volume of ascites.  No pneumoperitoneum. Musculoskeletal: There are no aggressive appearing lytic or blastic lesions noted in the visualized portions of the skeleton. IMPRESSION: 1. 12 mm calculus in the right renal pelvis associated with mild right hydronephrosis. 2. Additional nonobstructive calculi in the collecting systems of the kidneys bilaterally, measuring up to 12 mm in the upper pole the left kidney. 3. Normal appendix. 4. Atherosclerosis. 5. Status post cholecystectomy. Electronically Signed   By: Vinnie Langton M.D.   On: 04/08/2017 18:10    Review of Systems - Negative except Flank pain, nausea     No dysuria/ fever/ chills  Blood pressure 112/80, pulse 95, temperature 97.7 F (36.5 C), temperature source Oral, resp. rate 16, SpO2 95 %. General appearance: alert, cooperative and no distress Neck: no adenopathy and no JVD Resp: clear to auscultation bilaterally Cardio: regular rate and  rhythm GI: soft, non-tender; bowel sounds normal;  no masses,  no organomegaly Female genitalia: normal, penis: no lesions or discharge. testes: no masses or tenderness. no hernias Pelvic: pending cystoscopic evalauation Extremities: extremities normal, atraumatic, no cyanosis or edema Skin: Skin color, texture, turgor normal. No rashes or lesions Neurologic: Grossly normal  Assessment/Plan Partially obstructing 12 mm r renal pelvic stone. Has been in ED x 9 hours for pain control. No evidence of concurrent UTI/ sepsis. Will plan Cysto with JJ stent Definitive ESWL or U-scope probably next 1-2 weeks. Monday ESWL possible pending schedule check.   Bernestine Amass 04/08/2017, 10:49 PM

## 2017-04-08 NOTE — ED Notes (Signed)
Pt transported to OR

## 2017-04-08 NOTE — ED Notes (Signed)
Valuables and belongings given to [facility] pts daughter per pt request.

## 2017-04-08 NOTE — ED Notes (Signed)
Pt reports losing blood in her urine.

## 2017-04-08 NOTE — ED Provider Notes (Signed)
[facility] DEPT Provider Note   CSN: [id number] Arrival date & time: 04/08/17  1320     History   Chief Complaint Chief Complaint  Patient presents with  . Flank Pain  . Dizziness    HPI Carol Ortega is a 46 y.o. female. CC: severe flank pain.  HPI:  Pt has h/o kidney stones. Intermitent pain. Prior multiple procedures with stents, and lithotripsies. Pt of Dr. Jeffie Pollock  Severe right flank and mid AP with vomiting. No fever. + Dysuria.    Past Medical History:  Diagnosis Date  . Anemia   . Anxiety   . Asthma    seasonal  . Brain tumor ([place]) 2014   tx with radiation  . Cancer ([place])   . Complication of anesthesia 1999   lung collapse after surgery for gallbladder  . Depression   . Headache    migraines  . Hoarseness of voice   . Hypertension   . Kidney stone   . Obesity   . Parathyroid abnormality ([place])   . Pulmonary emboli ([place]) 2011  . Radiation    Brain tumor  . UTI (urinary tract infection)   . Vitamin D deficiency     Patient Active Problem List   Diagnosis Date Noted  . Hyperparathyroidism ([place]) 09/10/2015  . Vitamin D deficiency 06/25/2014  . Hypercalcemia 02/09/2014  . 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 CHOLECYSTECTOMY  1999  . CYSTOSCOPY W/ URETEROSCOPY    . CYSTOSCOPY WITH RETROGRADE PYELOGRAM, URETEROSCOPY AND STENT PLACEMENT Left 01/20/2014   Procedure: LEFT URETEROSCOPY WITH STENT PLACEMENT;  Surgeon: Irine Seal, MD;  Location: [facility];  Service: Urology;  Laterality: Left;  . CYSTOSCOPY WITH RETROGRADE PYELOGRAM, URETEROSCOPY AND STENT PLACEMENT Bilateral 04/23/2015   Procedure: CYSTOSCOPY WITH BILATERAL RETROGRADE PYELOGRAM, URETEROSCOPY AND STENT PLACEMENT;  Surgeon: Alexis Frock, MD;  Location: [facility];  Service: Urology;  Laterality: Bilateral;  . CYSTOSCOPY WITH STENT PLACEMENT Left 01/12/2014   Procedure: CYSTOSCOPY WITH STENT PLACEMENT left retrograde;  Surgeon: Irine Seal,  MD;  Location: [facility];  Service: Urology;  Laterality: Left;  . HOLMIUM LASER APPLICATION Left 03/24/4258   Procedure: HOLMIUM LASER APPLICATION;  Surgeon: Irine Seal, MD;  Location: [facility];  Service: Urology;  Laterality: Left;  . HOLMIUM LASER APPLICATION Bilateral 04/22/3874   Procedure: HOLMIUM LASER APPLICATION;  Surgeon: Alexis Frock, MD;  Location: [facility];  Service: Urology;  Laterality: Bilateral;  . kidney stone removal    . LITHOTRIPSY    . PARATHYROIDECTOMY Right 11/13/2016   Procedure: RIGHT SUPERIOR PARATHYROIDECTOMY;  Surgeon: Armandina Gemma, MD;  Location: [facility];  Service: General;  Laterality: Right;  . surgery for,ectopic pregnancy  2011    1 fallopian tube rupture  . TUBAL LIGATION  2011    OB History    No data available       Home Medications    Prior to Admission medications   Medication Sig Start Date End Date Taking? Authorizing Provider  albuterol (PROVENTIL) (2.5 MG/3ML) 0.083% nebulizer solution Inhale 2.5 mg into the lungs every 6 (six) hours as needed for shortness of breath. 06/26/16 06/26/17 Yes Historical Provider, MD  escitalopram (LEXAPRO) 10 MG tablet Take 10 mg by mouth daily.  02/02/17  Yes Historical Provider, MD  ibuprofen (ADVIL,MOTRIN) 200 MG tablet Take 400 mg by mouth every 6 (six) hours as needed (pain).   Yes Historical Provider, MD  Ibuprofen-Diphenhydramine Cit (ADVIL PM) 200-38 MG TABS Take 1 tablet  by mouth as needed (sleep).   Yes Historical Provider, MD  lisinopril-hydrochlorothiazide (PRINZIDE,ZESTORETIC) 20-12.5 MG per tablet Take 1 tablet by mouth daily.    Yes Historical Provider, MD  naproxen (NAPROSYN) 500 MG tablet Take 1 tablet (500 mg total) by mouth 2 (two) times daily as needed for mild pain, moderate pain or headache (TAKE WITH MEALS.). 03/09/17  Yes Kosse, PA-C  ondansetron (ZOFRAN ODT) 4 MG disintegrating tablet Take 1 tablet (4 mg total) by mouth every 8 (eight) hours as needed for nausea or vomiting. 03/09/17  Yes  Clifford, PA-C  pantoprazole (PROTONIX) 40 MG tablet Take 40 mg by mouth daily.  02/07/17  Yes Historical Provider, MD  PROVENTIL HFA 108 (90 Base) MCG/ACT inhaler Inhale 2 puffs into the lungs every 6 (six) hours as needed for wheezing or shortness of breath.  06/12/16  Yes Historical Provider, MD  HYDROcodone-acetaminophen (NORCO/VICODIN) 5-325 MG tablet Take 1-2 tablets by mouth every 4 (four) hours as needed for moderate pain. Patient not taking: Reported on 12/31/2016 11/13/16   Armandina Gemma, MD  polyethylene glycol [facility]) packet Take 17 g by mouth daily. Patient not taking: Reported on 03/09/2017 12/31/16   Okey Regal, PA-C    Family History Family History  Problem Relation Age of Onset  . Bell's palsy Mother   . Heart failure Mother   . Asthma Father   . Hypertension Father     Social History Social History  Substance Use Topics  . Smoking status: Never Smoker  . Smokeless tobacco: Never Used  . Alcohol use Yes     Comment: holidays     Allergies   Bee venom; Eggs or egg-derived products; Other; Gadobenate; Penicillins; Latex; Oxycodone-acetaminophen; and Penicillin g   Review of Systems Review of Systems  Constitutional: Negative for appetite change, chills, diaphoresis, fatigue and fever.  HENT: Negative for mouth sores, sore throat and trouble swallowing.   Eyes: Negative for visual disturbance.  Respiratory: Negative for cough, chest tightness, shortness of breath and wheezing.   Cardiovascular: Negative for chest pain.  Gastrointestinal: Positive for abdominal pain and vomiting. Negative for abdominal distention, diarrhea and nausea.  Endocrine: Negative for polydipsia, polyphagia and polyuria.  Genitourinary: Positive for dysuria. Negative for frequency and hematuria.  Musculoskeletal: Negative for gait problem.  Skin: Negative for color change, pallor and rash.  Neurological: Negative for dizziness, syncope, light-headedness and headaches.    Hematological: Does not bruise/bleed easily.  Psychiatric/Behavioral: Negative for behavioral problems and confusion.     Physical Exam Updated Vital Signs BP 112/80 (BP Location: Left Wrist)   Pulse 95   Temp 97.7 F (36.5 C) (Oral)   Resp 16   SpO2 95%   Physical Exam  Constitutional: She is oriented to person, place, and time. She appears well-developed and well-nourished. No distress.  Pt standing at bedside, hands on bed, writihing in pain  HENT:  Head: Normocephalic.  Eyes: Conjunctivae are normal. Pupils are equal, round, and reactive to light. No scleral icterus.  Neck: Normal range of motion. Neck supple. No thyromegaly present.  Cardiovascular: Normal rate and regular rhythm.  Exam reveals no gallop and no friction rub.   No murmur heard. Pulmonary/Chest: Effort normal and breath sounds normal. No respiratory distress. She has no wheezes. She has no rales.  Abdominal: Soft. Bowel sounds are normal. She exhibits no distension. There is no tenderness. There is no rebound.    Musculoskeletal: Normal range of motion.  Neurological: She is alert and oriented to person,  place, and time.  Skin: Skin is warm and dry. No rash noted.  Psychiatric: She has a normal mood and affect. Her behavior is normal.     ED Treatments / Results  Labs (all labs ordered are listed, but only abnormal results are displayed) Labs Reviewed  URINALYSIS, ROUTINE W REFLEX MICROSCOPIC - Abnormal; Notable for the following:       Result Value   APPearance HAZY (*)    Hgb urine dipstick LARGE (*)    Protein, ur 100 (*)    Leukocytes, UA TRACE (*)    Bacteria, UA RARE (*)    Squamous Epithelial / LPF 0-5 (*)    All other components within normal limits  COMPREHENSIVE METABOLIC PANEL - Abnormal; Notable for the following:    ALT 12 (*)    All other components within normal limits  URINE CULTURE  CBC WITH DIFFERENTIAL/PLATELET  PREGNANCY, URINE    EKG  EKG Interpretation None        Radiology Ct Renal Stone Study  Result Date: 04/08/2017 CLINICAL DATA:  46 year old female with history of right-sided flank pain for the past couple of weeks. Prior history of kidney stones. Decreased urination. EXAM: CT ABDOMEN AND PELVIS WITHOUT CONTRAST TECHNIQUE: Multidetector CT imaging of the abdomen and pelvis was performed following the standard protocol without IV contrast. COMPARISON:  CT the abdomen and pelvis 12/31/2016. FINDINGS: Lower chest: Unremarkable. Hepatobiliary: No definite cystic or solid hepatic lesions are identified on today's noncontrast CT examination. Status post cholecystectomy. Pancreas: No definite pancreatic mass or peripancreatic inflammatory changes are noted on today's noncontrast CT examination. Spleen: Unremarkable. Adrenals/Urinary Tract: In the right renal pelvis there is a 12 mm calculus. This is associated with fullness in the right renal pelvis and very mild right hydronephrosis. Multiple additional nonobstructive calculi are present within the collecting systems of both kidneys, measuring up to 12 mm in the upper pole the left kidney. 22 mm low-attenuation lesion in the lower pole of the right kidney is incompletely characterize, but statistically likely a cyst. Left kidney and bilateral adrenal glands are otherwise unremarkable in appearance. No hydroureteronephrosis. Urinary bladder is normal in appearance. Stomach/Bowel: Unenhanced appearance of the stomach is normal. No pathologic dilatation of small bowel or colon. Normal appendix. Vascular/Lymphatic: Atherosclerotic calcifications in the pelvic vasculature. No definite aneurysm in the abdominal or pelvic vasculature. No lymphadenopathy noted in the abdomen or pelvis. Reproductive: Uterus and ovaries are unremarkable in appearance. Other: No significant volume of ascites.  No pneumoperitoneum. Musculoskeletal: There are no aggressive appearing lytic or blastic lesions noted in the visualized portions of the  skeleton. IMPRESSION: 1. 12 mm calculus in the right renal pelvis associated with mild right hydronephrosis. 2. Additional nonobstructive calculi in the collecting systems of the kidneys bilaterally, measuring up to 12 mm in the upper pole the left kidney. 3. Normal appendix. 4. Atherosclerosis. 5. Status post cholecystectomy. Electronically Signed   By: Vinnie Langton M.D.   On: 04/08/2017 18:10    Procedures Procedures (including critical care time)  Medications Ordered in ED Medications  fentaNYL (SUBLIMAZE) injection 50 mcg ( Intravenous MAR Hold 04/08/17 2330)  ciprofloxacin (CIPRO) IVPB 400 mg (not administered)  ciprofloxacin (CIPRO) 400 MG/200ML IVPB (not administered)  sodium chloride 0.9 % bolus 500 mL (0 mLs Intravenous Stopped 04/08/17 1757)  0.9 %  sodium chloride infusion ( Intravenous New Bag/Given 04/08/17 1808)  ketorolac (TORADOL) 30 MG/ML injection 30 mg (30 mg Intravenous Given 04/08/17 1723)  ondansetron (ZOFRAN) injection 4 mg (4  mg Intravenous Given 04/08/17 1721)  tamsulosin (FLOMAX) capsule 0.4 mg (0.4 mg Oral Given 04/08/17 1807)  HYDROmorphone (DILAUDID) injection 1 mg (1 mg Intravenous Given 04/08/17 1839)  levofloxacin (LEVAQUIN) IVPB 750 mg (0 mg Intravenous Stopped 04/08/17 2215)  HYDROmorphone (DILAUDID) injection 1 mg (1 mg Intravenous Given 04/08/17 2103)     Initial Impression / Assessment and Plan / ED Course  I have reviewed the triage vital signs and the nursing notes.  Pertinent labs & imaging results that were available during my care of the patient were reviewed by me and considered in my medical decision making (see chart for details).    Discussed with  Dr. Risa Grill. He is here and planning stent.   Final Clinical Impressions(s) / ED Diagnoses   Final diagnoses:  Kidney stone    New Prescriptions Current Discharge Medication List       Tanna Furry, MD 04/08/17 2330

## 2017-04-08 NOTE — ED Triage Notes (Signed)
Patient states that she been having right flak pain for couple weeks.was told when she was here before that she had stones in both kidneys. Pain in right flank area gotten worse. Patient states that she hasnt been urinating less than normal over the past couple days. Patient states that she has to sit and wait little bit before urine comes out.  Patient also c/o dizzy spells x week.

## 2017-04-08 NOTE — Anesthesia Preprocedure Evaluation (Signed)
Anesthesia Evaluation  Patient identified by MRN, date of birth, ID band Patient awake    Reviewed: Allergy & Precautions, H&P , NPO status , Patient's Chart, lab work & pertinent test results  Airway Mallampati: II   Neck ROM: full    Dental   Pulmonary asthma ,    breath sounds clear to auscultation       Cardiovascular hypertension,  Rhythm:regular Rate:Normal     Neuro/Psych  Headaches, PSYCHIATRIC DISORDERS Anxiety Depression Brain tumor s/p XRT    GI/Hepatic   Endo/Other  Morbid obesity  Renal/GU Renal diseasestones     Musculoskeletal   Abdominal   Peds  Hematology   Anesthesia Other Findings   Reproductive/Obstetrics                             Anesthesia Physical Anesthesia Plan  ASA: II  Anesthesia Plan: General   Post-op Pain Management:    Induction: Intravenous  Airway Management Planned: LMA  Additional Equipment:   Intra-op Plan:   Post-operative Plan:   Informed Consent: I have reviewed the patients History and Physical, chart, labs and discussed the procedure including the risks, benefits and alternatives for the proposed anesthesia with the patient or authorized representative who has indicated his/her understanding and acceptance.     Plan Discussed with: CRNA, Anesthesiologist and Surgeon  Anesthesia Plan Comments:         Anesthesia Quick Evaluation

## 2017-04-08 NOTE — ED Notes (Signed)
Pt cleaned w/ CHG wipes.

## 2017-04-08 NOTE — Op Note (Signed)
Preoperative diagnosis:  1. R 78mm UPJ stone   Postoperative diagnosis:  1. same  Procedure:  1. Cystoscopy 2. right ureteral stent placement (24cm) 54f 3. right retrograde pyelography with interpretation filling defect right renal pelvis  Surgeon: Bernestine Amass, MD  Anesthesia: General  Complications: None  Intraoperative findings:   EBL: Minimal  Specimens: None  Indication: Carol Ortega is a 46 y.o. patient with uncontrolled pain from a R 67mm UPJ stone. After reviewing the management options for treatment, she elected to proceed with the above surgical procedure(s). We have discussed the potential benefits and risks of the procedure, side effects of the proposed treatment, the likelihood of the patient achieving the goals of the procedure, and any potential problems that might occur during the procedure or recuperation. Informed consent has been obtained.  Description of procedure:  The patient was taken to the operating room and general anesthesia was induced.  The patient was placed in the dorsal lithotomy position, prepped and draped in the usual sterile fashion, and preoperative antibiotics were administered. A preoperative time-out was performed.   Cystourethroscopy was performed.  The patient's urethra was examined and was normal. The bladder was then systematically examined in its entirety. There was no evidence for any bladder tumors, stones, or other mucosal pathology.    Attention then turned to the right ureteral orifice and a ureteral catheter was used to intubate the ureteral orifice.  Omnipaque contrast was injected through the ureteral catheter and a retrograde pyelogram was performed with findings as dictated above.  A 0.38 sensor guidewire was then advanced up the right ureter into the renal pelvis under fluoroscopic guidance.  The wire was then backloaded through the cystoscope and a ureteral stent was advance over the wire using Seldinger technique.  The  stent was positioned appropriately under fluoroscopic and cystoscopic guidance.  The wire was then removed with an adequate stent curl noted in the renal pelvis as well as in the bladder.  The bladder was then emptied and the procedure ended.  The patient appeared to tolerate the procedure well and without complications.  The patient was able to be awakened and transferred to the recovery unit in satisfactory condition.    Bernestine Amass, MD

## 2017-04-09 ENCOUNTER — Other Ambulatory Visit: Payer: Self-pay | Admitting: Urology

## 2017-04-09 ENCOUNTER — Emergency Department (HOSPITAL_COMMUNITY): Payer: Medicaid Other

## 2017-04-09 ENCOUNTER — Encounter (HOSPITAL_COMMUNITY): Payer: Self-pay | Admitting: Urology

## 2017-04-09 DIAGNOSIS — N200 Calculus of kidney: Secondary | ICD-10-CM

## 2017-04-09 MED ORDER — PNEUMOCOCCAL VAC POLYVALENT 25 MCG/0.5ML IJ INJ: 0.5000 mL | INJECTION | INTRAMUSCULAR | Status: DC

## 2017-04-09 MED ORDER — HYDROCODONE-ACETAMINOPHEN 5-325 MG PO TABS
1.0000 | ORAL_TABLET | ORAL | Status: DC | PRN
  Administered 2017-04-09 (×3): 2 via ORAL
  Filled 2017-04-09 (×3): qty 2

## 2017-04-09 MED ORDER — ONDANSETRON 4 MG PO TBDP: 4.0000 mg | ORAL_TABLET | Freq: Three times a day (TID) | ORAL | Status: DC | PRN

## 2017-04-09 MED ORDER — FENTANYL CITRATE (PF) 100 MCG/2ML IJ SOLN
INTRAMUSCULAR | Status: DC | PRN
  Administered 2017-04-08 – 2017-04-09 (×2): 50 ug via INTRAVENOUS

## 2017-04-09 MED ORDER — DIPHENHYDRAMINE HCL 25 MG PO CAPS
25.0000 mg | ORAL_CAPSULE | Freq: Every evening | ORAL | Status: DC | PRN
Start: 2017-04-09 — End: 2017-04-09
  Administered 2017-04-09: 25 mg via ORAL
  Filled 2017-04-09: qty 1

## 2017-04-09 MED ORDER — IBUPROFEN-DIPHENHYDRAMINE CIT 200-38 MG PO TABS: 1.0000 | ORAL_TABLET | ORAL | Status: DC | PRN

## 2017-04-09 MED ORDER — PROPOFOL 10 MG/ML IV BOLUS
INTRAVENOUS | Status: DC | PRN
  Administered 2017-04-08: 200 mg via INTRAVENOUS

## 2017-04-09 MED ORDER — MORPHINE SULFATE (PF) 10 MG/ML IV SOLN
2.0000 mg | INTRAVENOUS | Status: DC | PRN
  Administered 2017-04-09: 2 mg via INTRAVENOUS
  Administered 2017-04-09 (×2): 4 mg via INTRAVENOUS
  Filled 2017-04-09 (×3): qty 1

## 2017-04-09 MED ORDER — HEPARIN SODIUM (PORCINE) 5000 UNIT/ML IJ SOLN
5000.0000 [IU] | Freq: Three times a day (TID) | INTRAMUSCULAR | Status: DC
  Administered 2017-04-09: 5000 [IU] via SUBCUTANEOUS
  Filled 2017-04-09: qty 1

## 2017-04-09 MED ORDER — ALBUTEROL SULFATE (2.5 MG/3ML) 0.083% IN NEBU: 2.5000 mg | INHALATION_SOLUTION | Freq: Four times a day (QID) | RESPIRATORY_TRACT | Status: DC | PRN

## 2017-04-09 MED ORDER — HYDROCODONE-ACETAMINOPHEN 5-325 MG PO TABS: 1.0000 | ORAL_TABLET | Freq: Four times a day (QID) | ORAL | 0 refills | Status: DC | PRN

## 2017-04-09 MED ORDER — LIDOCAINE 2% (20 MG/ML) 5 ML SYRINGE
INTRAMUSCULAR | Status: DC | PRN
  Administered 2017-04-08: 60 mg via INTRAVENOUS

## 2017-04-09 MED ORDER — KETOROLAC TROMETHAMINE 10 MG PO TABS: 10.0000 mg | ORAL_TABLET | Freq: Four times a day (QID) | ORAL | 1 refills | Status: DC | PRN

## 2017-04-09 MED ORDER — ONDANSETRON HCL 4 MG/2ML IJ SOLN
INTRAMUSCULAR | Status: DC | PRN
  Administered 2017-04-09: 4 mg via INTRAVENOUS

## 2017-04-09 MED ORDER — FENTANYL CITRATE (PF) 100 MCG/2ML IJ SOLN
INTRAMUSCULAR | Status: AC
  Filled 2017-04-09: qty 2

## 2017-04-09 MED ORDER — SENNOSIDES-DOCUSATE SODIUM 8.6-50 MG PO TABS: 1.0000 | ORAL_TABLET | Freq: Two times a day (BID) | ORAL | 0 refills | Status: DC

## 2017-04-09 MED ORDER — LISINOPRIL 20 MG PO TABS
20.0000 mg | ORAL_TABLET | Freq: Every day | ORAL | Status: DC
  Administered 2017-04-09: 20 mg via ORAL
  Filled 2017-04-09: qty 1

## 2017-04-09 MED ORDER — OXYBUTYNIN CHLORIDE 5 MG PO TABS
5.0000 mg | ORAL_TABLET | Freq: Three times a day (TID) | ORAL | Status: DC | PRN
  Administered 2017-04-09 (×2): 5 mg via ORAL
  Filled 2017-04-09 (×2): qty 1

## 2017-04-09 MED ORDER — IBUPROFEN 200 MG PO TABS
400.0000 mg | ORAL_TABLET | Freq: Four times a day (QID) | ORAL | Status: DC | PRN
  Administered 2017-04-09: 400 mg via ORAL
  Filled 2017-04-09: qty 2

## 2017-04-09 MED ORDER — HYDROCHLOROTHIAZIDE 12.5 MG PO CAPS
12.5000 mg | ORAL_CAPSULE | Freq: Every day | ORAL | Status: DC
  Administered 2017-04-09: 12.5 mg via ORAL
  Filled 2017-04-09: qty 1

## 2017-04-09 MED ORDER — MIDAZOLAM HCL 5 MG/5ML IJ SOLN
INTRAMUSCULAR | Status: DC | PRN
  Administered 2017-04-08 (×2): 1 mg via INTRAVENOUS

## 2017-04-09 MED ORDER — SODIUM CHLORIDE 0.9 % IR SOLN
Status: DC | PRN
  Administered 2017-04-08: 3000 mL via INTRAVESICAL

## 2017-04-09 MED ORDER — LACTATED RINGERS IV SOLN
INTRAVENOUS | Status: DC | PRN
Start: 2017-04-08 — End: 2017-04-09
  Administered 2017-04-08: via INTRAVENOUS

## 2017-04-09 MED ORDER — LISINOPRIL-HYDROCHLOROTHIAZIDE 20-12.5 MG PO TABS: 1.0000 | ORAL_TABLET | Freq: Every day | ORAL | Status: DC

## 2017-04-09 MED ORDER — ALBUTEROL SULFATE (2.5 MG/3ML) 0.083% IN NEBU
INHALATION_SOLUTION | RESPIRATORY_TRACT | Status: AC
  Administered 2017-04-09: 2.5 mg
  Filled 2017-04-09: qty 3

## 2017-04-09 MED ORDER — IBUPROFEN 200 MG PO TABS: 200.0000 mg | ORAL_TABLET | Freq: Every evening | ORAL | Status: DC | PRN

## 2017-04-09 MED ORDER — KCL IN DEXTROSE-NACL 20-5-0.45 MEQ/L-%-% IV SOLN
INTRAVENOUS | Status: DC
  Administered 2017-04-09: 02:00:00 via INTRAVENOUS
  Filled 2017-04-09 (×2): qty 1000

## 2017-04-09 MED ORDER — ALBUTEROL SULFATE (2.5 MG/3ML) 0.083% IN NEBU
3.0000 mL | INHALATION_SOLUTION | Freq: Four times a day (QID) | RESPIRATORY_TRACT | Status: DC | PRN
  Administered 2017-04-09: 3 mL via RESPIRATORY_TRACT
  Filled 2017-04-09: qty 3

## 2017-04-09 MED ORDER — ONDANSETRON HCL 4 MG/2ML IJ SOLN
4.0000 mg | INTRAMUSCULAR | Status: DC | PRN
Start: 2017-04-09 — End: 2017-04-09

## 2017-04-09 MED ORDER — ESCITALOPRAM OXALATE 10 MG PO TABS
10.0000 mg | ORAL_TABLET | Freq: Every day | ORAL | Status: DC
  Administered 2017-04-09: 10 mg via ORAL
  Filled 2017-04-09: qty 1

## 2017-04-09 MED ORDER — SODIUM CHLORIDE 0.9 % IV SOLN
INTRAVENOUS | Status: DC | PRN
  Administered 2017-04-09: 50 mL

## 2017-04-09 MED ORDER — PANTOPRAZOLE SODIUM 40 MG PO TBEC
40.0000 mg | DELAYED_RELEASE_TABLET | Freq: Every day | ORAL | Status: DC
  Administered 2017-04-09: 40 mg via ORAL
  Filled 2017-04-09: qty 1

## 2017-04-09 NOTE — Care Management Note (Signed)
Case Management Note  Patient Details  Name: Carol Ortega MRN: [id number] Date of Birth: 03-Dec-1971  Subjective/Objective:                    Action/Plan:d/c home.   Expected Discharge Date:  04/09/17               Expected Discharge Plan:  Home/Self Care  In-House Referral:     Discharge planning Services  CM Consult  Post Acute Care Choice:    Choice offered to:     DME Arranged:    DME Agency:     HH Arranged:    HH Agency:     Status of Service:  Completed, signed off  If discussed at [employer] of Stay Meetings, dates discussed:    Additional Comments:  Dessa Phi, RN 04/09/2017, 11:58 AM

## 2017-04-09 NOTE — Discharge Instructions (Signed)
1 - You may have urinary urgency (bladder spasms) and bloody urine on / off with stent in place. This is normal. ° °2 - Call MD or go to ER for fever >102, severe pain / nausea / vomiting not relieved by medications, or acute change in medical status ° °

## 2017-04-09 NOTE — Discharge Summary (Signed)
Physician Discharge Summary  Patient ID: Mairi Stagliano MRN: [id number] DOB/AGE: 01-27-1971 46 y.o.  Admit date: 04/08/2017 Discharge date: 04/09/2017  Admission Diagnoses:  Discharge Diagnoses:  Active Problems:   Kidney stone   Discharged Condition: good  Hospital Course:   1 - Right Renal / UPJ Stone - pt underwent cysto and right ureteral stent placement 4/22 the day of admission for renal decompression in setting of refracotry colic from likely intermitnantly obstructing stone. By the late AM of POD 1 she is ambulatory, pain controlled on PO meds, maintaining hydration, and felt to be adequate for discharge. Plan will be for ureteroscopy in elective setting for definitive management.  Consults: None  Significant Diagnostic Studies: labs: Cr <1.5. WBC <12k.  Treatments: surgery: right ureteral stent placment.   Discharge Exam: Blood pressure 111/80, pulse 97, temperature 97.5 F (36.4 C), temperature source Oral, resp. rate 16, height 5\' 6"  (1.676 m), weight (!) 157.6 kg (347 lb 7.1 oz), SpO2 96 %. General appearance: alert, cooperative and appears stated age Eyes: negative Nose: Nares normal. Septum midline. Mucosa normal. No drainage or sinus tenderness. Throat: lips, mucosa, and tongue normal; teeth and gums normal Neck: supple, symmetrical, trachea midline Back: symmetric, no curvature. ROM normal. No CVA tenderness. Resp: non-labored on room air Cardio: Nl rate GI: soft, non-tender; bowel sounds normal; no masses,  no organomegaly and morbid truncal obesity. Extremities: extremities normal, atraumatic, no cyanosis or edema Pulses: 2+ and symmetric Skin: Skin color, texture, turgor normal. No rashes or lesions Lymph nodes: Cervical, supraclavicular, and axillary nodes normal. Neurologic: Grossly normal  Disposition: 01-Home or Self Care   Allergies as of 04/09/2017      Reactions   Bee Venom Swelling   Eggs Or Egg-derived Products Anaphylaxis   Other  Anaphylaxis   Surgical dye.. Pt states ok to take if previously taken benadryl or prednisone   Gadobenate Itching, Nausea Only   Itching in throat (contrast dye)   Penicillins Hives, Swelling   Has patient had a PCN reaction causing immediate rash, facial/tongue/throat swelling, SOB or lightheadedness with hypotension: yes Has patient had a PCN reaction causing severe rash involving mucus membranes or skin necrosis: no Has patient had a PCN reaction that required hospitalization no Has patient had a PCN reaction occurring within the last 10 years: no If all of the above answers are "NO", then may proceed with Cephalosporin use.   Latex Rash   Oxycodone-acetaminophen Itching   Penicillin G Rash      Medication List    STOP taking these medications   ibuprofen 200 MG tablet Commonly known as:  ADVIL,MOTRIN   naproxen 500 MG tablet Commonly known as:  NAPROSYN   polyethylene glycol packet Commonly known as:  MIRALAX     TAKE these medications   ADVIL PM 200-38 MG Tabs Generic drug:  Ibuprofen-Diphenhydramine Cit Take 1 tablet by mouth as needed (sleep).   escitalopram 10 MG tablet Commonly known as:  LEXAPRO Take 10 mg by mouth daily.   HYDROcodone-acetaminophen 5-325 MG tablet Commonly known as:  NORCO Take 1-2 tablets by mouth every 6 (six) hours as needed for severe pain. What changed:  when to take this  reasons to take this   ketorolac 10 MG tablet Commonly known as:  TORADOL Take 1 tablet (10 mg total) by mouth every 6 (six) hours as needed. For mild pain / stent discomfort.   lisinopril-hydrochlorothiazide 20-12.5 MG tablet Commonly known as:  PRINZIDE,ZESTORETIC Take 1 tablet by mouth daily.  ondansetron 4 MG disintegrating tablet Commonly known as:  ZOFRAN ODT Take 1 tablet (4 mg total) by mouth every 8 (eight) hours as needed for nausea or vomiting.   pantoprazole 40 MG tablet Commonly known as:  PROTONIX Take 40 mg by mouth daily.   PROVENTIL HFA  108 (90 Base) MCG/ACT inhaler Generic drug:  albuterol Inhale 2 puffs into the lungs every 6 (six) hours as needed for wheezing or shortness of breath.   albuterol (2.5 MG/3ML) 0.083% nebulizer solution Commonly known as:  PROVENTIL Inhale 2.5 mg into the lungs every 6 (six) hours as needed for shortness of breath.   senna-docusate 8.6-50 MG tablet Commonly known as:  Senokot-S Take 1 tablet by mouth 2 (two) times daily. While taking strongest pain meds to prevent constipation.      Follow-up Information    Alexis Frock, MD Follow up.   Specialty:  Urology Why:  Office will call to arrange next surgery within 2 weeks. Contact information: [address] [place] [address] [phone]           Signed: Alexis Frock 04/09/2017, 7:52 AM

## 2017-04-09 NOTE — Transfer of Care (Signed)
Immediate Anesthesia Transfer of Care Note  Patient: Carol Ortega  Procedure(s) Performed: Procedure(s): CYSTOSCOPY WITH RETROGRADE PYELOGRAM/URETERAL STENT PLACEMENT (Right)  Patient Location: PACU  Anesthesia Type:General  Level of Consciousness:  sedated, patient cooperative and responds to stimulation  Airway & Oxygen Therapy:Patient Spontanous Breathing and Patient connected to face mask oxgen  Post-op Assessment:  Report given to PACU RN and Post -op Vital signs reviewed and stable  Post vital signs:  Reviewed and stable  Last Vitals:  Vitals:   04/08/17 2221 04/09/17 0015  BP: 112/80   Pulse: 95   Resp: 16 13  Temp:  43.8 C    Complications: No apparent anesthesia complications

## 2017-04-09 NOTE — Anesthesia Procedure Notes (Signed)
Procedure Name: LMA Insertion Performed by: West Pugh Pre-anesthesia Checklist: Patient identified, Emergency Drugs available, Suction available, Patient being monitored and Timeout performed Patient Re-evaluated:Patient Re-evaluated prior to inductionOxygen Delivery Method: Circle system utilized Preoxygenation: Pre-oxygenation with 100% oxygen Intubation Type: IV induction Ventilation: Mask ventilation without difficulty LMA: LMA with gastric port inserted LMA Size: 5.0 Number of attempts: 1 Placement Confirmation: positive ETCO2,  CO2 detector and breath sounds checked- equal and bilateral Tube secured with: Tape Dental Injury: Teeth and Oropharynx as per pre-operative assessment

## 2017-04-09 NOTE — Progress Notes (Signed)
Pt discharged to home with family.  Pt verbalized understanding of discharge instructions and follow up care.  All belongings sent home with pt.  Education completed re: pain management, when to call MD and follow up care.  Danton Clap, RN

## 2017-04-10 ENCOUNTER — Encounter (HOSPITAL_COMMUNITY): Payer: Self-pay | Admitting: *Deleted

## 2017-04-10 LAB — URINE CULTURE: Culture: <10000 — AB

## 2017-04-10 NOTE — Anesthesia Postprocedure Evaluation (Signed)
Anesthesia Post Note  Patient: Carol Ortega  Procedure(s) Performed: Procedure(s) (LRB): CYSTOSCOPY WITH RETROGRADE PYELOGRAM/URETERAL STENT PLACEMENT (Right)  Patient location during evaluation: PACU Anesthesia Type: General Level of consciousness: awake and alert and patient cooperative Pain management: pain level controlled Vital Signs Assessment: post-procedure vital signs reviewed and stable Respiratory status: spontaneous breathing and respiratory function stable Cardiovascular status: stable Anesthetic complications: no       Last Vitals:  Vitals:   04/09/17 0113 04/09/17 0523  BP: 121/70 111/80  Pulse: 90 97  Resp: 15 16  Temp: 36.3 C 36.4 C    Last Pain:  Vitals:   04/09/17 1342  TempSrc:   PainSc: [place]

## 2017-04-12 MED ORDER — GENTAMICIN SULFATE 40 MG/ML IJ SOLN
500.0000 mg | INTRAMUSCULAR | Status: AC
  Administered 2017-04-13: 500 mg via INTRAVENOUS
  Filled 2017-04-12 (×2): qty 12.5

## 2017-04-12 NOTE — Anesthesia Preprocedure Evaluation (Signed)
Anesthesia Evaluation  Patient identified by MRN, date of birth, ID band Patient awake    Reviewed: Allergy & Precautions, H&P , NPO status , Patient's Chart, lab work & pertinent test results  Airway Mallampati: II  TM Distance: >3 FB Neck ROM: full    Dental   Pulmonary asthma ,    breath sounds clear to auscultation       Cardiovascular hypertension,  Rhythm:regular Rate:Normal     Neuro/Psych  Headaches, PSYCHIATRIC DISORDERS Anxiety Depression Brain tumor s/p XRT    GI/Hepatic   Endo/Other  Morbid obesity  Renal/GU Renal diseasestones     Musculoskeletal   Abdominal   Peds  Hematology   Anesthesia Other Findings   Reproductive/Obstetrics                             Anesthesia Physical  Anesthesia Plan  ASA: III  Anesthesia Plan: General   Post-op Pain Management:    Induction: Intravenous  Airway Management Planned: LMA  Additional Equipment:   Intra-op Plan:   Post-operative Plan: Extubation in OR  Informed Consent: I have reviewed the patients History and Physical, chart, labs and discussed the procedure including the risks, benefits and alternatives for the proposed anesthesia with the patient or authorized representative who has indicated his/her understanding and acceptance.   Dental advisory given  Plan Discussed with: CRNA  Anesthesia Plan Comments:         Anesthesia Quick Evaluation

## 2017-04-13 ENCOUNTER — Ambulatory Visit (HOSPITAL_COMMUNITY): Payer: Medicaid Other | Admitting: Anesthesiology

## 2017-04-13 ENCOUNTER — Other Ambulatory Visit: Payer: Self-pay

## 2017-04-13 ENCOUNTER — Encounter (HOSPITAL_COMMUNITY): Payer: Self-pay | Admitting: *Deleted

## 2017-04-13 ENCOUNTER — Encounter (HOSPITAL_COMMUNITY)
Admission: RE | Disposition: A | Discharge status: Home / Self Care | Payer: Self-pay | Source: Ambulatory Visit | Attending: Urology

## 2017-04-13 ENCOUNTER — Ambulatory Visit (HOSPITAL_COMMUNITY): Payer: Medicaid Other

## 2017-04-13 ENCOUNTER — Ambulatory Visit (HOSPITAL_COMMUNITY)
Admission: RE | Admit: 2017-04-13 | Discharge: 2017-04-13 | Disposition: A | Discharge status: Home / Self Care | Payer: Medicaid Other | Source: Ambulatory Visit | Attending: Urology | Admitting: Urology

## 2017-04-13 DIAGNOSIS — Z6841 Body Mass Index (BMI) 40.0 and over, adult: Secondary | ICD-10-CM | POA: Insufficient documentation

## 2017-04-13 DIAGNOSIS — Z880 Allergy status to penicillin: Secondary | ICD-10-CM | POA: Diagnosis not present

## 2017-04-13 DIAGNOSIS — R490 Dysphonia: Secondary | ICD-10-CM | POA: Diagnosis not present

## 2017-04-13 DIAGNOSIS — Z91030 Bee allergy status: Secondary | ICD-10-CM | POA: Diagnosis not present

## 2017-04-13 DIAGNOSIS — Z885 Allergy status to narcotic agent status: Secondary | ICD-10-CM | POA: Diagnosis not present

## 2017-04-13 DIAGNOSIS — Z9049 Acquired absence of other specified parts of digestive tract: Secondary | ICD-10-CM | POA: Diagnosis not present

## 2017-04-13 DIAGNOSIS — Z86711 Personal history of pulmonary embolism: Secondary | ICD-10-CM | POA: Insufficient documentation

## 2017-04-13 DIAGNOSIS — Z91040 Latex allergy status: Secondary | ICD-10-CM | POA: Insufficient documentation

## 2017-04-13 DIAGNOSIS — Z8249 Family history of ischemic heart disease and other diseases of the circulatory system: Secondary | ICD-10-CM | POA: Diagnosis not present

## 2017-04-13 DIAGNOSIS — D649 Anemia, unspecified: Secondary | ICD-10-CM | POA: Insufficient documentation

## 2017-04-13 DIAGNOSIS — Z91012 Allergy to eggs: Secondary | ICD-10-CM | POA: Insufficient documentation

## 2017-04-13 DIAGNOSIS — Z820 Family history of epilepsy and other diseases of the nervous system: Secondary | ICD-10-CM | POA: Diagnosis not present

## 2017-04-13 DIAGNOSIS — E892 Postprocedural hypoparathyroidism: Secondary | ICD-10-CM | POA: Diagnosis not present

## 2017-04-13 DIAGNOSIS — J45909 Unspecified asthma, uncomplicated: Secondary | ICD-10-CM | POA: Insufficient documentation

## 2017-04-13 DIAGNOSIS — N200 Calculus of kidney: Secondary | ICD-10-CM | POA: Insufficient documentation

## 2017-04-13 DIAGNOSIS — R51 Headache: Secondary | ICD-10-CM | POA: Diagnosis not present

## 2017-04-13 DIAGNOSIS — Z825 Family history of asthma and other chronic lower respiratory diseases: Secondary | ICD-10-CM | POA: Diagnosis not present

## 2017-04-13 DIAGNOSIS — Z8589 Personal history of malignant neoplasm of other organs and systems: Secondary | ICD-10-CM | POA: Diagnosis not present

## 2017-04-13 DIAGNOSIS — F419 Anxiety disorder, unspecified: Secondary | ICD-10-CM | POA: Insufficient documentation

## 2017-04-13 DIAGNOSIS — F329 Major depressive disorder, single episode, unspecified: Secondary | ICD-10-CM | POA: Insufficient documentation

## 2017-04-13 DIAGNOSIS — Z923 Personal history of irradiation: Secondary | ICD-10-CM | POA: Diagnosis not present

## 2017-04-13 DIAGNOSIS — I10 Essential (primary) hypertension: Secondary | ICD-10-CM | POA: Insufficient documentation

## 2017-04-13 DIAGNOSIS — E559 Vitamin D deficiency, unspecified: Secondary | ICD-10-CM | POA: Insufficient documentation

## 2017-04-13 DIAGNOSIS — Z79899 Other long term (current) drug therapy: Secondary | ICD-10-CM | POA: Insufficient documentation

## 2017-04-13 HISTORY — PX: CYSTOSCOPY WITH RETROGRADE PYELOGRAM, URETEROSCOPY AND STENT PLACEMENT: SHX5789

## 2017-04-13 HISTORY — PX: HOLMIUM LASER APPLICATION: SHX5852

## 2017-04-13 SURGERY — CYSTOURETEROSCOPY, WITH RETROGRADE PYELOGRAM AND STENT INSERTION
Anesthesia: General | Laterality: Right

## 2017-04-13 MED ORDER — PROMETHAZINE HCL 25 MG/ML IJ SOLN: 6.2500 mg | INTRAMUSCULAR | Status: DC | PRN

## 2017-04-13 MED ORDER — FENTANYL CITRATE (PF) 100 MCG/2ML IJ SOLN
INTRAMUSCULAR | Status: AC
  Filled 2017-04-13: qty 2

## 2017-04-13 MED ORDER — DEXAMETHASONE SODIUM PHOSPHATE 10 MG/ML IJ SOLN
INTRAMUSCULAR | Status: AC
  Filled 2017-04-13: qty 1

## 2017-04-13 MED ORDER — MIDAZOLAM HCL 5 MG/5ML IJ SOLN
INTRAMUSCULAR | Status: DC | PRN
  Administered 2017-04-13: 2 mg via INTRAVENOUS

## 2017-04-13 MED ORDER — ONDANSETRON HCL 4 MG/2ML IJ SOLN
INTRAMUSCULAR | Status: AC
  Filled 2017-04-13: qty 2

## 2017-04-13 MED ORDER — ONDANSETRON HCL 4 MG/2ML IJ SOLN
INTRAMUSCULAR | Status: DC | PRN
  Administered 2017-04-13: 4 mg via INTRAVENOUS

## 2017-04-13 MED ORDER — LACTATED RINGERS IV SOLN
INTRAVENOUS | Status: DC
  Administered 2017-04-13 (×2): via INTRAVENOUS

## 2017-04-13 MED ORDER — PROPOFOL 10 MG/ML IV BOLUS
INTRAVENOUS | Status: AC
  Filled 2017-04-13: qty 40

## 2017-04-13 MED ORDER — FENTANYL CITRATE (PF) 100 MCG/2ML IJ SOLN
INTRAMUSCULAR | Status: DC | PRN
  Administered 2017-04-13 (×4): 50 ug via INTRAVENOUS

## 2017-04-13 MED ORDER — MIDAZOLAM HCL 2 MG/2ML IJ SOLN
INTRAMUSCULAR | Status: AC
  Filled 2017-04-13: qty 2

## 2017-04-13 MED ORDER — SODIUM CHLORIDE 0.9 % IR SOLN
Status: DC | PRN
  Administered 2017-04-13: 4000 mL via INTRAVESICAL

## 2017-04-13 MED ORDER — MEPERIDINE HCL 50 MG/ML IJ SOLN: 6.2500 mg | INTRAMUSCULAR | Status: DC | PRN

## 2017-04-13 MED ORDER — LIDOCAINE 2% (20 MG/ML) 5 ML SYRINGE
INTRAMUSCULAR | Status: AC
  Filled 2017-04-13: qty 5

## 2017-04-13 MED ORDER — HYDROCODONE-ACETAMINOPHEN 5-325 MG PO TABS: 1.0000 | ORAL_TABLET | Freq: Four times a day (QID) | ORAL | 0 refills | Status: DC | PRN

## 2017-04-13 MED ORDER — IOHEXOL 300 MG/ML  SOLN
INTRAMUSCULAR | Status: DC | PRN
  Administered 2017-04-13: 14 mL via URETHRAL

## 2017-04-13 MED ORDER — HYDROMORPHONE HCL 1 MG/ML IJ SOLN: 0.2500 mg | INTRAMUSCULAR | Status: DC | PRN

## 2017-04-13 MED ORDER — DEXAMETHASONE SODIUM PHOSPHATE 10 MG/ML IJ SOLN
INTRAMUSCULAR | Status: DC | PRN
  Administered 2017-04-13: 10 mg via INTRAVENOUS

## 2017-04-13 MED ORDER — HYDROCODONE-ACETAMINOPHEN 5-325 MG PO TABS
1.0000 | ORAL_TABLET | Freq: Four times a day (QID) | ORAL | Status: DC | PRN
  Administered 2017-04-13: 2 via ORAL
  Filled 2017-04-13: qty 2

## 2017-04-13 MED ORDER — PROPOFOL 10 MG/ML IV BOLUS
INTRAVENOUS | Status: DC | PRN
  Administered 2017-04-13: 250 mg via INTRAVENOUS
  Administered 2017-04-13: 50 mg via INTRAVENOUS

## 2017-04-13 MED ORDER — LIDOCAINE 2% (20 MG/ML) 5 ML SYRINGE
INTRAMUSCULAR | Status: DC | PRN
  Administered 2017-04-13: 100 mg via INTRAVENOUS

## 2017-04-13 SURGICAL SUPPLY — 24 items
BAG URO CATCHER STRL LF (MISCELLANEOUS) ×3 IMPLANT
BASKET LASER NITINOL 1.9FR (BASKET) ×3 IMPLANT
CATH INTERMIT  6FR 70CM (CATHETERS) ×3 IMPLANT
CLOTH BEACON ORANGE TIMEOUT ST (SAFETY) ×3 IMPLANT
COVER SURGICAL LIGHT HANDLE (MISCELLANEOUS) IMPLANT
FIBER LASER FLEXIVA 1000 (UROLOGICAL SUPPLIES) IMPLANT
FIBER LASER FLEXIVA 365 (UROLOGICAL SUPPLIES) IMPLANT
FIBER LASER FLEXIVA 550 (UROLOGICAL SUPPLIES) IMPLANT
FIBER LASER TRAC TIP (UROLOGICAL SUPPLIES) ×3 IMPLANT
GLOVE BIOGEL M STRL SZ7.5 (GLOVE) IMPLANT
GOWN STRL REUS W/TWL LRG LVL3 (GOWN DISPOSABLE) ×6 IMPLANT
GUIDEWIRE ANG ZIPWIRE 038X150 (WIRE) ×3 IMPLANT
GUIDEWIRE STR DUAL SENSOR (WIRE) ×3 IMPLANT
MANIFOLD NEPTUNE II (INSTRUMENTS) ×3 IMPLANT
NS IRRIG 1000ML POUR BTL (IV SOLUTION) ×3 IMPLANT
PACK CYSTO (CUSTOM PROCEDURE TRAY) ×3 IMPLANT
SHEATH ACCESS URETERAL 24CM (SHEATH) ×3 IMPLANT
SHEATH ACCESS URETERAL 38CM (SHEATH) IMPLANT
SHEATH ACCESS URETERAL 54CM (SHEATH) IMPLANT
STENT POLARIS 5FRX24 (STENTS) ×3 IMPLANT
SYR CONTROL 10ML LL (SYRINGE) ×3 IMPLANT
TUBE FEEDING 8FR 16IN STR KANG (MISCELLANEOUS) ×3 IMPLANT
TUBING CONNECTING 10 (TUBING) ×2 IMPLANT
TUBING CONNECTING 10' (TUBING) ×1

## 2017-04-13 NOTE — Brief Op Note (Signed)
04/13/2017  9:55 AM  PATIENT:  Carol Ortega  46 y.o. female  PRE-OPERATIVE DIAGNOSIS:  RIGHT RENAL STONE  POST-OPERATIVE DIAGNOSIS:  RIGHT RENAL STONE  PROCEDURE:  Procedure(s): CYSTOSCOPY WITH RETROGRADE PYELOGRAM, URETEROSCOPY AND STENT EXCHANGE (Right) HOLMIUM LASER APPLICATION (Right)  SURGEON:  Surgeon(s) and Role:    * Alexis Frock, MD - Primary  PHYSICIAN ASSISTANT:   ASSISTANTS: none   ANESTHESIA:   general  EBL:  Total I/O In: 1000 [I.V.:1000] Out: -   BLOOD ADMINISTERED:none  DRAINS: none   LOCAL MEDICATIONS USED:  NONE  SPECIMEN:  Source of Specimen:  Rt renal stone fragments  DISPOSITION OF SPECIMEN:  [facility] for compositional analysis  COUNTS:  YES  TOURNIQUET:  * No tourniquets in log *  DICTATION: .Other Dictation: Dictation Number [id number]  PLAN OF CARE: Discharge to home after PACU  PATIENT DISPOSITION:  PACU - hemodynamically stable.   Delay start of Pharmacological VTE agent (>24hrs) due to surgical blood loss or risk of bleeding: yes

## 2017-04-13 NOTE — Anesthesia Postprocedure Evaluation (Signed)
Anesthesia Post Note  Patient: Carol Ortega  Procedure(s) Performed: Procedure(s) (LRB): CYSTOSCOPY WITH RETROGRADE PYELOGRAM, URETEROSCOPY AND STENT EXCHANGE (Right) HOLMIUM LASER APPLICATION (Right)  Patient location during evaluation: PACU Anesthesia Type: General Level of consciousness: sedated and patient cooperative Pain management: pain level controlled Vital Signs Assessment: post-procedure vital signs reviewed and stable Respiratory status: spontaneous breathing Cardiovascular status: stable Anesthetic complications: no       Last Vitals:  Vitals:   04/13/17 1053 04/13/17 1133  BP: 117/63 116/70  Pulse: 79 73  Resp: 20 18  Temp: 36.6 C     Last Pain:  Vitals:   04/13/17 1200  TempSrc:   PainSc: 2                  Nolon Nations

## 2017-04-13 NOTE — Progress Notes (Signed)
Called Dr. Tresa Moore  Notified of patient's current stable VS and normal EKG. OK to go home per Dr. Tresa Moore. Patient  States she is feeling much better burping. Pain down to 2/10. Good po no N/V.

## 2017-04-13 NOTE — Op Note (Deleted)
  The note originally documented on this encounter has been moved the the encounter in which it belongs.  

## 2017-04-13 NOTE — Interval H&P Note (Signed)
History and Physical Interval Note:  04/13/2017 9:02 AM  Carol Ortega  has presented today for surgery, with the diagnosis of RIGHT RENAL STONE  The various methods of treatment have been discussed with the patient and family. After consideration of risks, benefits and other options for treatment, the patient has consented to  Procedure(s): CYSTOSCOPY WITH RETROGRADE PYELOGRAM, URETEROSCOPY AND STENT PLACEMENT (Right) HOLMIUM LASER APPLICATION (Right) as a surgical intervention .  The patient's history has been reviewed, patient examined, no change in status, stable for surgery.  I have reviewed the patient's chart and labs.  Questions were answered to the patient's satisfaction.     Samanda Buske

## 2017-04-13 NOTE — Progress Notes (Signed)
Dr Tresa Moore contacted by Gomez Cleverly RN:  aware of current vital signs, and complaint of R sided chest pain.  Also, aware pt sipping ginger ale and ambulated and voided in bathroom.  Dr Tresa Moore ordered EKG, and ordered pt to be discharged if EKG normal.

## 2017-04-13 NOTE — H&P (Signed)
Carol Ortega is an 46 y.o. female.    Chief Complaint: PRe-op RIGHT Ureteroscopic Stone Manipulation  HPI:   1 - RIGHT Proximal Ureteral Stone - 42mm Rt UPJ stone by ER CT 04/08/17 on eval flank pain. Stone is solitary. Temporized with Rt JJ stent 4/22 by Dr. Risa Grill. Most recent UA withtou infectious parameters.  Today "Carol Ortega" is seen to proceed with RIGHT ureteroscopic stone manipulation with goal of Rt side stone free. NO interval fevers.   Past Medical History:  Diagnosis Date  . Anemia   . Anxiety   . Asthma    seasonal  . Brain tumor ([place]) 2014   tx with radiation  . Cancer ([place])   . Complication of anesthesia 1999   lung collapse after surgery for gallbladder  . Depression   . Headache    migraines  . Hoarseness of voice   . Hypertension   . Kidney stone   . Obesity   . Parathyroid abnormality ([place])   . Pulmonary emboli ([place]) 2011  . Radiation    Brain tumor  . UTI (urinary tract infection)   . Vitamin D deficiency     Past Surgical History:  Procedure Laterality Date  . CHOLECYSTECTOMY  1999  . CYSTOSCOPY W/ URETERAL STENT PLACEMENT Right 04/08/2017   Procedure: CYSTOSCOPY WITH RETROGRADE PYELOGRAM/URETERAL STENT PLACEMENT;  Surgeon: Rana Snare, MD;  Location: [facility];  Service: Urology;  Laterality: Right;  . CYSTOSCOPY W/ URETEROSCOPY    . CYSTOSCOPY WITH RETROGRADE PYELOGRAM, URETEROSCOPY AND STENT PLACEMENT Left 01/20/2014   Procedure: LEFT URETEROSCOPY WITH STENT PLACEMENT;  Surgeon: Irine Seal, MD;  Location: [facility];  Service: Urology;  Laterality: Left;  . CYSTOSCOPY WITH RETROGRADE PYELOGRAM, URETEROSCOPY AND STENT PLACEMENT Bilateral 04/23/2015   Procedure: CYSTOSCOPY WITH BILATERAL RETROGRADE PYELOGRAM, URETEROSCOPY AND STENT PLACEMENT;  Surgeon: Alexis Frock, MD;  Location: [facility];  Service: Urology;  Laterality: Bilateral;  . CYSTOSCOPY WITH STENT PLACEMENT Left 01/12/2014   Procedure: CYSTOSCOPY WITH STENT PLACEMENT left retrograde;  Surgeon: Irine Seal, MD;  Location: [facility];  Service: Urology;  Laterality: Left;  . HOLMIUM LASER APPLICATION Left 05/19/3761   Procedure: HOLMIUM LASER APPLICATION;  Surgeon: Irine Seal, MD;  Location: [facility];  Service: Urology;  Laterality: Left;  . HOLMIUM LASER APPLICATION Bilateral 07/20/1516   Procedure: HOLMIUM LASER APPLICATION;  Surgeon: Alexis Frock, MD;  Location: [facility];  Service: Urology;  Laterality: Bilateral;  . kidney stone removal    . LITHOTRIPSY    . PARATHYROIDECTOMY Right 11/13/2016   Procedure: RIGHT SUPERIOR PARATHYROIDECTOMY;  Surgeon: Armandina Gemma, MD;  Location: [facility];  Service: General;  Laterality: Right;  . surgery for,ectopic pregnancy  2011    1 fallopian tube rupture  . TUBAL LIGATION  2011    Family History  Problem Relation Age of Onset  . Bell's palsy Mother   . Heart failure Mother   . Asthma Father   . Hypertension Father    Social History:  reports that she has never smoked. She has never used smokeless tobacco. She reports that she drinks alcohol. She reports that she does not use drugs.  Allergies:  Allergies  Allergen Reactions  . Bee Venom Swelling  . Eggs Or Egg-Derived Products Anaphylaxis  . Other Anaphylaxis    Surgical dye.. Pt states ok to take if previously taken benadryl or prednisone  . Gadobenate Itching and Nausea Only    Itching in throat (contrast dye)  . Penicillins Hives and Swelling    Has  patient had a PCN reaction causing immediate rash, facial/tongue/throat swelling, SOB or lightheadedness with hypotension: yes Has patient had a PCN reaction causing severe rash involving mucus membranes or skin necrosis: no Has patient had a PCN reaction that required hospitalization no Has patient had a PCN reaction occurring within the last 10 years: no If all of the above answers are "NO", then may proceed with Cephalosporin use.   . Latex Rash  . Oxycodone-Acetaminophen Itching  . Penicillin G Rash    Medications Prior to Admission   Medication Sig Dispense Refill  . albuterol (PROVENTIL) (2.5 MG/3ML) 0.083% nebulizer solution Inhale 2.5 mg into the lungs every 6 (six) hours as needed for shortness of breath.    . escitalopram (LEXAPRO) 10 MG tablet Take 10 mg by mouth daily.   3  . HYDROcodone-acetaminophen (NORCO) 5-325 MG tablet Take 1-2 tablets by mouth every 6 (six) hours as needed for severe pain. 20 tablet 0  . Ibuprofen-Diphenhydramine Cit (ADVIL PM) 200-38 MG TABS Take 1 tablet by mouth as needed (sleep).    Marland Kitchen ketorolac (TORADOL) 10 MG tablet Take 1 tablet (10 mg total) by mouth every 6 (six) hours as needed. For mild pain / stent discomfort. 20 tablet 1  . lisinopril-hydrochlorothiazide (PRINZIDE,ZESTORETIC) 20-12.5 MG per tablet Take 1 tablet by mouth daily.     . ondansetron (ZOFRAN ODT) 4 MG disintegrating tablet Take 1 tablet (4 mg total) by mouth every 8 (eight) hours as needed for nausea or vomiting. 15 tablet 0  . pantoprazole (PROTONIX) 40 MG tablet Take 40 mg by mouth daily.   0  . PROVENTIL HFA 108 (90 Base) MCG/ACT inhaler Inhale 2 puffs into the lungs every 6 (six) hours as needed for wheezing or shortness of breath.   0  . senna-docusate (SENOKOT-S) 8.6-50 MG tablet Take 1 tablet by mouth 2 (two) times daily. While taking strongest pain meds to prevent constipation. 30 tablet 0    No results found for this or any previous visit (from the past 48 hour(s)). No results found.  Review of Systems  Constitutional: Negative.  Negative for chills and fever.  HENT: Negative.   Eyes: Negative.   Respiratory: Negative.   Cardiovascular: Negative.   Gastrointestinal: Negative.   Genitourinary: Positive for flank pain.  Skin: Negative.   Neurological: Negative.   Endo/Heme/Allergies: Negative.   Psychiatric/Behavioral: Negative.     Last menstrual period 01/21/2017. Physical Exam  Constitutional: She is oriented to person, place, and time. She appears well-developed.  HENT:  Head: Normocephalic.   Eyes: Pupils are equal, round, and reactive to light.  Neck: Normal range of motion.  Cardiovascular: Normal rate.   Respiratory: Effort normal.  GI: Soft.  Morbid truncal obesity limits sensitivity of exam.   Genitourinary:  Genitourinary Comments: NO CVAT at present. Some stent colic as expected.   Musculoskeletal: Normal range of motion.  Neurological: She is alert and oriented to person, place, and time.  Skin: Skin is warm.  Psychiatric: She has a normal mood and affect.     Assessment/Plan  1 - RIGHT Proximal Ureteral Stone - proceed as planned with RIGHT ureteroscopy. Risks, benefits, alternatives, need for post-op stent discussed previously and reiterated today.   Alexis Frock, MD 04/13/2017, 6:56 AM

## 2017-04-13 NOTE — Progress Notes (Signed)
Dr Lissa Hoard contacted: pt complaining of R sided chest pain en route to [facility].  Pt ambulated to bathroom and voided unassisted. VSS, sipping ginger ale.  Dr Lissa Hoard instructed RN to contact medical service.

## 2017-04-13 NOTE — Discharge Instructions (Signed)
General Anesthesia, Adult, Care After °These instructions provide you with information about caring for yourself after your procedure. Your health care provider may also give you more specific instructions. Your treatment has been planned according to current medical practices, but problems sometimes occur. Call your health care provider if you have any problems or questions after your procedure. °What can I expect after the procedure? °After the procedure, it is common to have: °· Vomiting. °· A sore throat. °· Mental slowness. °It is common to feel: °· Nauseous. °· Cold or shivery. °· Sleepy. °· Tired. °· Sore or achy, even in parts of your body where you did not have surgery. °Follow these instructions at home: °For at least 24 hours after the procedure: °· Do not: °¨ Participate in activities where you could fall or become injured. °¨ Drive. °¨ Use heavy machinery. °¨ Drink alcohol. °¨ Take sleeping pills or medicines that cause drowsiness. °¨ Make important decisions or sign legal documents. °¨ Take care of children on your own. °· Rest. °Eating and drinking °· If you vomit, drink water, juice, or soup when you can drink without vomiting. °· Drink enough fluid to keep your urine clear or pale yellow. °· Make sure you have little or no nausea before eating solid foods. °· Follow the diet recommended by your health care provider. °General instructions °· Have a responsible adult stay with you until you are awake and alert. °· Return to your normal activities as told by your health care provider. Ask your health care provider what activities are safe for you. °· Take over-the-counter and prescription medicines only as told by your health care provider. °· If you smoke, do not smoke without supervision. °· Keep all follow-up visits as told by your health care provider. This is important. °Contact a health care provider if: °· You continue to have nausea or vomiting at home, and medicines are not helpful. °· You  cannot drink fluids or start eating again. °· You cannot urinate after 8-12 hours. °· You develop a skin rash. °· You have fever. °· You have increasing redness at the site of your procedure. °Get help right away if: °· You have difficulty breathing. °· You have chest pain. °· You have unexpected bleeding. °· You feel that you are having a life-threatening or urgent problem. °This information is not intended to replace advice given to you by your health care provider. Make sure you discuss any questions you have with your health care provider. °Document Released: 03/12/2001 Document Revised: 05/08/2016 Document Reviewed: 11/18/2015 °Elsevier Interactive Patient Education © 2017 [employer] ° ° ° °1 - You may have urinary urgency (bladder spasms) and bloody urine on / off with stent in place. This is normal. ° °2 - Call MD or go to ER for fever >102, severe pain / nausea / vomiting not relieved by medications, or acute change in medical status ° °

## 2017-04-13 NOTE — Anesthesia Procedure Notes (Signed)
Procedure Name: LMA Insertion Date/Time: 04/13/2017 8:54 AM Performed by: Lind Covert Pre-anesthesia Checklist: Patient identified, Emergency Drugs available, Suction available, Patient being monitored and Timeout performed Oxygen Delivery Method: Circle system utilized Preoxygenation: Pre-oxygenation with 100% oxygen Intubation Type: IV induction LMA: LMA inserted LMA Size: 4.0 Number of attempts: 1 Placement Confirmation: positive ETCO2 and breath sounds checked- equal and bilateral Tube secured with: Tape Dental Injury: Teeth and Oropharynx as per pre-operative assessment

## 2017-04-13 NOTE — Transfer of Care (Signed)
Immediate Anesthesia Transfer of Care Note  Patient: Carol Ortega  Procedure(s) Performed: Procedure(s): CYSTOSCOPY WITH RETROGRADE PYELOGRAM, URETEROSCOPY AND STENT EXCHANGE (Right) HOLMIUM LASER APPLICATION (Right)  Patient Location: PACU  Anesthesia Type:General  Level of Consciousness: sedated  Airway & Oxygen Therapy: Patient Spontanous Breathing and Patient connected to face mask oxygen  Post-op Assessment: Report given to RN and Post -op Vital signs reviewed and stable  Post vital signs: Reviewed and stable  Last Vitals:  Vitals:   04/13/17 0703  BP: (!) 167/111  Pulse: 80  Resp: 18  Temp: 36.8 C    Last Pain:  Vitals:   04/13/17 0726  TempSrc:   PainSc: 10-Worst pain ever      Patients Stated Pain Goal: 3 (89/84/21 0312)  Complications: No apparent anesthesia complications

## 2017-04-13 NOTE — Op Note (Signed)
NAMELIAN, POUNDS NO.:  [id number]  MEDICAL RECORD NO.:  [id number]  LOCATION:                               FACILITY:  [facility]  PHYSICIAN:  Alexis Frock, MD     DATE OF BIRTH:  26-Mar-1971  DATE OF PROCEDURE:  04/13/2017 DATE OF DISCHARGE:                              OPERATIVE REPORT   PREOPERATIVE DIAGNOSIS:  Large right renal stone with likely intermittent obstruction.  PROCEDURE: 1. Cystoscopy with right retrograde pyelogram interpretation. 2. Exchange of right ureteral stent, 5 x 24 Polaris, no tether. 3. Right ureteroscopy with laser lithotripsy. 4. Retrograde pyelogram interpretation.  INDICATION:  Carol Ortega is a 46 year old lady with history of hyperparathyroidism, now status post surgery, she had recurrent nephrolithiasis, very high frequency before this.  She was found on workup of colicky flank pain to have a large right proximal renal pelvis stone with intermittent obstruction.  She underwent temporizing with ureteral stenting approximately 5 days ago.  Options were discussed for definitive management including recommended path of ureteroscopic stone manipulation given the stone size and density and she wished to proceed. Informed consent was obtained and placed in the medical record.  PROCEDURE IN DETAIL:  The patient being, Carol Ortega, verified. Procedure being right ureteroscopic stone manipulation was confirmed. Procedure time-out was performed.  Intravenous antibiotics were administered.  General anesthesia introduced.  The patient was placed into a low lithotomy position.  Sterile field was created by prepping and draping the patient's vagina, introitus, and proximal thighs using iodine.  Next, cystourethroscopy was performed using a rigid cystoscope with offset lens.  Inspection of bladder revealed distal end of the ureteral stent in situ.  This was grasped brought to the level of the urethral meatus, through which a 0.038 Zip  wire was advanced into lower pole and exchanged for an open-ended catheter and right retrograde pyelogram was obtained.  Right retrograde pyelogram demonstrated a single right ureter with single-system right kidney.  There was a large calcification and mobile filling defect in the renal pelvis consistent with known stone.  The Zip wire was once again advanced to lower pole set aside as a safety wire. An 8-French feeding tube placed in urinary bladder for pressure release. A semi-rigid ureteroscopy was performed of the distal four-fifths of the right ureter along a separate Sensor working wire.  No mucosal abnormalities were seen.  The semi-rigid scope was then exchanged for a 12/14, 24-cm ureteral access sheath at the level of the proximal ureter and systematic inspection was performed of the proximal right ureter and right kidney including all calices x3 with a single-channel digital ureteroscope.  The stone in question was indeed found.  It was sitting in the lower pole calyx.  The angulation of this was somewhat acute. Therefore, it was grasped in Escape basket, and transferred into upper pole calyx to allow less acute angulation and holmium laser energy applied to the stone using settings of 0.3 joules and 30 hertz dusting approximately 70% of its volume.  The remaining 30% was fragmented into pieces, 1-2 mm in diameter, which were then sequentially grasped, brought out in their entirety, set aside for compositional analysis. Additional dusting technique was used.  The  residual stones fragmenting into essentially sand.  Following these maneuvers, there was excellent hemostasis.  No evidence of renal perforation.  All stone fragments larger than one-third millimeter had been removed.  The access sheath was removed under continuous vision.  No mucosal abnormalities were found.  Given access sheath use and large volume of stone in dusting technique, it was felt that interval stenting  with non-tethered approach would be warranted.  As such, a new 5 x 24 Polaris-type stent was placed remaining safety wire using cystoscopic and fluoroscopic guidance.  Good proximal and distal deployment were noted.  Bladder was emptied per cystoscope.  Procedure was then terminated.  The patient tolerated procedure well.  There were no immediate periprocedural complications. The patient was taken to the postanesthesia care unit in stable condition.    ______________________________ Alexis Frock, MD   ______________________________ Alexis Frock, MD    TM/MEDQ  D:  04/13/2017  T:  04/13/2017  Job:  [id number]

## 2017-04-26 ENCOUNTER — Emergency Department (HOSPITAL_COMMUNITY)
Admission: EM | Admit: 2017-04-26 | Discharge: 2017-04-26 | Disposition: A | Discharge status: Home / Self Care | Payer: Medicaid Other | Attending: Emergency Medicine | Admitting: Emergency Medicine

## 2017-04-26 ENCOUNTER — Encounter (HOSPITAL_COMMUNITY): Payer: Self-pay | Admitting: Emergency Medicine

## 2017-04-26 ENCOUNTER — Emergency Department (HOSPITAL_COMMUNITY): Payer: Medicaid Other

## 2017-04-26 DIAGNOSIS — Z91040 Latex allergy status: Secondary | ICD-10-CM | POA: Insufficient documentation

## 2017-04-26 DIAGNOSIS — N390 Urinary tract infection, site not specified: Secondary | ICD-10-CM

## 2017-04-26 DIAGNOSIS — R509 Fever, unspecified: Secondary | ICD-10-CM | POA: Diagnosis present

## 2017-04-26 DIAGNOSIS — Z79899 Other long term (current) drug therapy: Secondary | ICD-10-CM | POA: Insufficient documentation

## 2017-04-26 DIAGNOSIS — J45909 Unspecified asthma, uncomplicated: Secondary | ICD-10-CM | POA: Insufficient documentation

## 2017-04-26 DIAGNOSIS — I10 Essential (primary) hypertension: Secondary | ICD-10-CM | POA: Insufficient documentation

## 2017-04-26 LAB — COMPREHENSIVE METABOLIC PANEL
ALBUMIN: 3.6 g/dL (ref 3.5–5.0)
ALT: 17 U/L (ref 14–54)
AST: 19 U/L (ref 15–41)
Alkaline Phosphatase: 82 U/L (ref 38–126)
Anion gap: 9 (ref 5–15)
BILIRUBIN TOTAL: 1.4 mg/dL — AB (ref 0.3–1.2)
BUN: 6 mg/dL (ref 6–20)
CALCIUM: 8.8 mg/dL — AB (ref 8.9–10.3)
CO2: 23 mmol/L (ref 22–32)
CREATININE: 0.98 mg/dL (ref 0.44–1.00)
Chloride: 102 mmol/L (ref 101–111)
GFR calc Af Amer: >60 mL/min (ref >60)
GLUCOSE: 118 mg/dL — AB (ref 65–99)
POTASSIUM: 3.1 mmol/L — AB (ref 3.5–5.1)
Sodium: 134 mmol/L — ABNORMAL LOW (ref 135–145)
TOTAL PROTEIN: 7.8 g/dL (ref 6.5–8.1)

## 2017-04-26 LAB — URINALYSIS, ROUTINE W REFLEX MICROSCOPIC
BILIRUBIN URINE: NEGATIVE
Bacteria, UA: NONE SEEN
GLUCOSE, UA: NEGATIVE mg/dL
Hgb urine dipstick: NEGATIVE
KETONES UR: NEGATIVE mg/dL
NITRITE: NEGATIVE
PH: 7 (ref 5.0–8.0)
Protein, ur: NEGATIVE mg/dL
Specific Gravity, Urine: 1.008 (ref 1.005–1.030)

## 2017-04-26 LAB — CBC WITH DIFFERENTIAL/PLATELET
BASOS ABS: 0 10*3/uL (ref 0.0–0.1)
Basophils Relative: 0 %
EOS PCT: 0 %
Eosinophils Absolute: 0 10*3/uL (ref 0.0–0.7)
HCT: 41.4 % (ref 36.0–46.0)
Hemoglobin: 13.6 g/dL (ref 12.0–15.0)
LYMPHS PCT: 18 %
Lymphs Abs: 2.4 10*3/uL (ref 0.7–4.0)
MCH: 28.3 pg (ref 26.0–34.0)
MCHC: 32.9 g/dL (ref 30.0–36.0)
MCV: 86.3 fL (ref 78.0–100.0)
MONO ABS: 1.7 10*3/uL — AB (ref 0.1–1.0)
Monocytes Relative: 13 %
Neutro Abs: 9.2 10*3/uL — ABNORMAL HIGH (ref 1.7–7.7)
Neutrophils Relative %: 69 %
Platelets: 302 10*3/uL (ref 150–400)
RBC: 4.8 MIL/uL (ref 3.87–5.11)
RDW: 14.1 % (ref 11.5–15.5)
WBC: 13.4 10*3/uL — AB (ref 4.0–10.5)

## 2017-04-26 LAB — I-STAT CG4 LACTIC ACID, ED: Lactic Acid, Venous: 0.84 mmol/L (ref 0.5–1.9)

## 2017-04-26 LAB — LIPASE, BLOOD: Lipase: 17 U/L (ref 11–51)

## 2017-04-26 MED ORDER — POTASSIUM CHLORIDE CRYS ER 20 MEQ PO TBCR
40.0000 meq | EXTENDED_RELEASE_TABLET | Freq: Once | ORAL | Status: AC
  Administered 2017-04-26: 40 meq via ORAL
  Filled 2017-04-26: qty 2

## 2017-04-26 MED ORDER — ONDANSETRON 4 MG PO TBDP
4.0000 mg | ORAL_TABLET | Freq: Once | ORAL | Status: AC | PRN
  Administered 2017-04-26: 4 mg via ORAL
  Filled 2017-04-26: qty 1

## 2017-04-26 MED ORDER — SODIUM CHLORIDE 0.9 % IV BOLUS (SEPSIS)
2000.0000 mL | Freq: Once | INTRAVENOUS | Status: AC
  Administered 2017-04-26: 2000 mL via INTRAVENOUS

## 2017-04-26 MED ORDER — ONDANSETRON HCL 4 MG/2ML IJ SOLN
4.0000 mg | Freq: Once | INTRAMUSCULAR | Status: AC
  Administered 2017-04-26: 4 mg via INTRAVENOUS
  Filled 2017-04-26: qty 2

## 2017-04-26 MED ORDER — CEPHALEXIN 500 MG PO CAPS: 500.0000 mg | ORAL_CAPSULE | Freq: Four times a day (QID) | ORAL | 0 refills | Status: DC

## 2017-04-26 MED ORDER — IBUPROFEN 800 MG PO TABS
800.0000 mg | ORAL_TABLET | Freq: Once | ORAL | Status: AC
  Administered 2017-04-26: 800 mg via ORAL
  Filled 2017-04-26: qty 1

## 2017-04-26 MED ORDER — HYDROMORPHONE HCL 1 MG/ML IJ SOLN
1.0000 mg | Freq: Once | INTRAMUSCULAR | Status: AC
  Administered 2017-04-26: 1 mg via INTRAVENOUS
  Filled 2017-04-26: qty 1

## 2017-04-26 MED ORDER — DEXTROSE 5 % IV SOLN
1.0000 g | Freq: Once | INTRAVENOUS | Status: AC
  Administered 2017-04-26: 1 g via INTRAVENOUS
  Filled 2017-04-26: qty 10

## 2017-04-26 NOTE — Discharge Instructions (Addendum)
Drink plenty of fluids follow-up with your urologist or family doctor the beginning of next week. Take Tylenol for pain

## 2017-04-26 NOTE — ED Triage Notes (Signed)
Pt complains of fever for the past 3 days ranging between 103-105.  Endorses taking ibuprofen for relief.  Endorses N&V and diarrhea. Pt endorses congestion also. Ambulatory and A&O x4. Had a stent removed on Monday from her left kidney and has been feeling sick ever since.

## 2017-04-26 NOTE — ED Notes (Signed)
Pt states that she feels ill, has body aches and has had a fever.  Pt had a ureteral stent removed recently and recently was treated for a UTI. Pt to the bathroom to void

## 2017-04-26 NOTE — ED Provider Notes (Signed)
[facility] DEPT Provider Note   CSN: [id number] Arrival date & time: 04/26/17  1708     History   Chief Complaint Chief Complaint  Patient presents with  . Fever  . Emesis    HPI Carol Ortega is a 46 y.o. female.  Patient complains of fever and aches. She just had a stent removed 2 days ago from her right ureter   The history is provided by the patient. No language interpreter was used.  Fever   This is a new problem. The current episode started 2 days ago. The problem occurs constantly. The problem has not changed since onset.The maximum temperature noted was 103 to 104 F. Associated symptoms include vomiting. Pertinent negatives include no chest pain, no diarrhea, no congestion, no headaches and no cough. She has tried nothing for the symptoms.  Emesis   Associated symptoms include a fever. Pertinent negatives include no abdominal pain, no cough, no diarrhea and no headaches.    Past Medical History:  Diagnosis Date  . Anemia   . Anxiety   . Asthma    seasonal  . Brain tumor ([place]) 2014   tx with radiation  . Cancer ([place])   . Complication of anesthesia 1999   lung collapse after surgery for gallbladder  . Depression   . Headache    migraines  . Hoarseness of voice   . Hypertension   . Kidney stone   . Obesity   . Parathyroid abnormality ([place])   . Pulmonary emboli ([place]) 2011  . Radiation    Brain tumor  . UTI (urinary tract infection)   . Vitamin D deficiency     Patient Active Problem List   Diagnosis Date Noted  . Kidney stone 04/09/2017  . Hyperparathyroidism ([place]) 09/10/2015  . Vitamin D deficiency 06/25/2014  . Hypercalcemia 02/09/2014  . 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 CHOLECYSTECTOMY  1999  . CYSTOSCOPY W/ URETERAL STENT PLACEMENT Right 04/08/2017   Procedure: CYSTOSCOPY WITH RETROGRADE PYELOGRAM/URETERAL STENT PLACEMENT;  Surgeon: Rana Snare, MD;  [facility];   Service: Urology;  Laterality: Right;  . CYSTOSCOPY W/ URETEROSCOPY    . CYSTOSCOPY WITH RETROGRADE PYELOGRAM, URETEROSCOPY AND STENT PLACEMENT Left 01/20/2014   Procedure: LEFT URETEROSCOPY WITH STENT PLACEMENT;  Surgeon: Irine Seal, MD;  [facility];  Service: Urology;  Laterality: Left;  . CYSTOSCOPY WITH RETROGRADE PYELOGRAM, URETEROSCOPY AND STENT PLACEMENT Bilateral 04/23/2015   Procedure: CYSTOSCOPY WITH BILATERAL RETROGRADE PYELOGRAM, URETEROSCOPY AND STENT PLACEMENT;  Surgeon: Alexis Frock, MD;  [facility];  Service: Urology;  Laterality: Bilateral;  . CYSTOSCOPY WITH RETROGRADE PYELOGRAM, URETEROSCOPY AND STENT PLACEMENT Right 04/13/2017   Procedure: CYSTOSCOPY WITH RETROGRADE PYELOGRAM, URETEROSCOPY AND STENT EXCHANGE;  Surgeon: Alexis Frock, MD;  [facility];  Service: Urology;  Laterality: Right;  . CYSTOSCOPY WITH STENT PLACEMENT Left 01/12/2014   Procedure: CYSTOSCOPY WITH STENT PLACEMENT left retrograde;  Surgeon: Irine Seal, MD;  [facility];  Service: Urology;  Laterality: Left;  . HOLMIUM LASER APPLICATION Left 02/17/6711   Procedure: HOLMIUM LASER APPLICATION;  Surgeon: Irine Seal, MD;  [facility];  Service: Urology;  Laterality: Left;  . HOLMIUM LASER APPLICATION Bilateral 03/22/8098   Procedure: HOLMIUM LASER APPLICATION;  Surgeon: Alexis Frock, MD;  [facility];  Service: Urology;  Laterality: Bilateral;  . HOLMIUM LASER APPLICATION Right 8/33/8250   Procedure: HOLMIUM LASER APPLICATION;  Surgeon: Alexis Frock, MD;  [facility];  Service: Urology;  Laterality: Right;  . kidney stone removal    . LITHOTRIPSY    . PARATHYROIDECTOMY Right 11/13/2016   Procedure: RIGHT SUPERIOR PARATHYROIDECTOMY;  Surgeon: Armandina Gemma, MD;  Location: [facility];  Service: General;  Laterality: Right;  . surgery for,ectopic pregnancy  2011    1 fallopian tube rupture  . TUBAL LIGATION  2011    OB History    No data available       Home Medications     Prior to Admission medications   Medication Sig Start Date End Date Taking? Authorizing Provider  albuterol (PROVENTIL HFA;VENTOLIN HFA) 108 (90 Base) MCG/ACT inhaler Inhale 2 puffs into the lungs every 6 (six) hours as needed for wheezing or shortness of breath.   Yes [provider]  albuterol (PROVENTIL) (2.5 MG/3ML) 0.083% nebulizer solution Inhale 2.5 mg into the lungs every 6 (six) hours as needed for wheezing or shortness of breath.    Yes [provider]  escitalopram (LEXAPRO) 10 MG tablet Take 10 mg by mouth daily.    Yes [provider]  Ibuprofen-Diphenhydramine Cit (ADVIL PM) 200-38 MG TABS Take 1 tablet by mouth at bedtime as needed (for sleep).    Yes [provider]  ketorolac (TORADOL) 10 MG tablet Take 1 tablet (10 mg total) by mouth every 6 (six) hours as needed. For mild pain / stent discomfort. 04/09/17  Yes Alexis Frock, MD  lisinopril-hydrochlorothiazide (PRINZIDE,ZESTORETIC) 20-12.5 MG per tablet Take 1 tablet by mouth daily.    Yes [provider]  pantoprazole (PROTONIX) 40 MG tablet Take 40 mg by mouth daily.    Yes [provider]  cephALEXin (KEFLEX) 500 MG capsule Take 1 capsule (500 mg total) by mouth 4 (four) times daily. 04/26/17   Milton Ferguson, MD    Family History Family History  Problem Relation Age of Onset  . Bell's palsy Mother   . Heart failure Mother   . Asthma Father   . Hypertension Father     Social History Social History  Substance Use Topics  . Smoking status: Never Smoker  . Smokeless tobacco: Never Used  . Alcohol use Yes     Comment: holidays     Allergies   Bee venom; Contrast media [iodinated diagnostic agents]; Eggs or egg-derived products; Penicillins; Latex; and Oxycodone-acetaminophen   Review of Systems Review of Systems  Constitutional: Positive for fever. Negative for appetite change and fatigue.  HENT: Negative for congestion, ear discharge and sinus pressure.    Eyes: Negative for discharge.  Respiratory: Negative for cough.   Cardiovascular: Negative for chest pain.  Gastrointestinal: Positive for vomiting. Negative for abdominal pain and diarrhea.  Genitourinary: Negative for frequency and hematuria.  Musculoskeletal: Negative for back pain.  Skin: Negative for rash.  Neurological: Negative for seizures and headaches.  Psychiatric/Behavioral: Negative for hallucinations.     Physical Exam Updated Vital Signs BP 110/67   Pulse (!) 107   Temp (!) 103.1 F (39.5 C) (Oral)   Resp 16   SpO2 94%   Physical Exam  Constitutional: She is oriented to person, place, and time. She appears well-developed.  HENT:  Head: Normocephalic.  Eyes: Conjunctivae and EOM are normal. No scleral icterus.  Neck: Neck supple. No thyromegaly present.  Cardiovascular: Normal rate and regular rhythm.  Exam reveals no gallop and no friction rub.   No murmur heard. Pulmonary/Chest: No stridor. She has no wheezes. She has no rales. She exhibits no tenderness.  Abdominal: She exhibits no distension. There  is no tenderness. There is no rebound.  Musculoskeletal: Normal range of motion. She exhibits no edema.  Lymphadenopathy:    She has no cervical adenopathy.  Neurological: She is oriented to person, place, and time. She exhibits normal muscle tone. Coordination normal.  Skin: No rash noted. No erythema.  Psychiatric: She has a normal mood and affect. Her behavior is normal.     ED Treatments / Results  Labs (all labs ordered are listed, but only abnormal results are displayed) Labs Reviewed  COMPREHENSIVE METABOLIC PANEL - Abnormal; Notable for the following:       Result Value   Sodium 134 (*)    Potassium 3.1 (*)    Glucose, Bld 118 (*)    Calcium 8.8 (*)    Total Bilirubin 1.4 (*)    All other components within normal limits  CBC WITH DIFFERENTIAL/PLATELET - Abnormal; Notable for the following:    WBC 13.4 (*)    Neutro Abs 9.2 (*)    Monocytes  Absolute 1.7 (*)    All other components within normal limits  URINALYSIS, ROUTINE W REFLEX MICROSCOPIC - Abnormal; Notable for the following:    Leukocytes, UA TRACE (*)    Squamous Epithelial / LPF 0-5 (*)    All other components within normal limits  URINE CULTURE  LIPASE, BLOOD  I-STAT CG4 LACTIC ACID, ED  I-STAT CG4 LACTIC ACID, ED    EKG  EKG Interpretation None       Radiology Dg Chest 2 View  Result Date: 04/26/2017 CLINICAL DATA:  Fever for the past 3 days. EXAM: CHEST  2 VIEW COMPARISON:  01/01/2017. FINDINGS: The heart size and mediastinal contours are within normal limits. Both lungs are clear. The visualized skeletal structures are unremarkable. IMPRESSION: No active cardiopulmonary disease.  Stable exam. Electronically Signed   By: Staci Righter M.D.   On: 04/26/2017 19:32    Procedures Procedures (including critical care time)  Medications Ordered in ED Medications  ondansetron (ZOFRAN-ODT) disintegrating tablet 4 mg (4 mg Oral Given 04/26/17 1735)  sodium chloride 0.9 % bolus 2,000 mL (2,000 mLs Intravenous New Bag/Given 04/26/17 1813)  HYDROmorphone (DILAUDID) injection 1 mg (1 mg Intravenous Given 04/26/17 1907)  ondansetron (ZOFRAN) injection 4 mg (4 mg Intravenous Given 04/26/17 1908)  cefTRIAXone (ROCEPHIN) 1 g in dextrose 5 % 50 mL IVPB (1 g Intravenous New Bag/Given 04/26/17 1935)  potassium chloride SA (K-DUR,KLOR-CON) CR tablet 40 mEq (40 mEq Oral Given 04/26/17 1932)  ibuprofen (ADVIL,MOTRIN) tablet 800 mg (800 mg Oral Given 04/26/17 1935)     Initial Impression / Assessment and Plan / ED Course  I have reviewed the triage vital signs and the nursing notes.  Pertinent labs & imaging results that were available during my care of the patient were reviewed by me and considered in my medical decision making (see chart for details).    Labs suggest possible urinary tract infection. She will be given Keflex and told to follow-up with her family doctor or  urology next week  Final Clinical Impressions(s) / ED Diagnoses   Final diagnoses:  Lower urinary tract infectious disease    New Prescriptions New Prescriptions   CEPHALEXIN (KEFLEX) 500 MG CAPSULE    Take 1 capsule (500 mg total) by mouth 4 (four) times daily.  Patient complains of fever and aches last couple days. She just had a stent taken out of her right ureter 2 days ago   Milton Ferguson, MD 04/26/17 2053

## 2017-04-29 LAB — URINE CULTURE: Culture: >=100000 — AB

## 2017-04-30 ENCOUNTER — Telehealth: Payer: Self-pay | Admitting: Emergency Medicine

## 2017-04-30 NOTE — Telephone Encounter (Signed)
Post ED Visit - Positive Culture Follow-up: Successful Patient Follow-Up  Culture assessed and recommendations reviewed by: []  Elenor Quinones, Pharm.D. [x]  Heide Guile, Pharm.D., BCPS AQ-ID []  Parks Neptune, Pharm.D., BCPS []  Alycia Rossetti, Pharm.D., BCPS []  [place], Pharm.D., BCPS, AAHIVP []  Legrand Como, Pharm.D., BCPS, AAHIVP []  [name] Arnt, PharmD, BCPS []  Dimitri Ped, PharmD, BCPS []  Vincenza Hews, PharmD, BCPS  Positive urine culture  []  Patient discharged without antimicrobial prescription and treatment is now indicated [x]  Organism is resistant to prescribed ED discharge antimicrobial []  Patient with positive blood cultures  Changes discussed with ED provider: Mervin Hack PA New antibiotic prescription stop cephalexin, symptom check, if + symptoms return to ED or primary MD or urologist  Attempting to contact patient    Carol Ortega 04/30/2017, 11:41 AM

## 2017-06-19 ENCOUNTER — Telehealth: Payer: Self-pay | Admitting: Emergency Medicine

## 2017-06-19 NOTE — Telephone Encounter (Signed)
LOST TO FOLLOWUP 

## 2017-10-13 ENCOUNTER — Emergency Department (HOSPITAL_COMMUNITY): Payer: Medicaid Other

## 2017-10-13 ENCOUNTER — Emergency Department (HOSPITAL_COMMUNITY)
Admission: EM | Admit: 2017-10-13 | Discharge: 2017-10-14 | Disposition: A | Discharge status: Home / Self Care | Payer: Medicaid Other | Attending: Emergency Medicine | Admitting: Emergency Medicine

## 2017-10-13 ENCOUNTER — Encounter (HOSPITAL_COMMUNITY): Payer: Self-pay | Admitting: Nurse Practitioner

## 2017-10-13 DIAGNOSIS — I10 Essential (primary) hypertension: Secondary | ICD-10-CM | POA: Diagnosis not present

## 2017-10-13 DIAGNOSIS — J45909 Unspecified asthma, uncomplicated: Secondary | ICD-10-CM | POA: Diagnosis not present

## 2017-10-13 DIAGNOSIS — N939 Abnormal uterine and vaginal bleeding, unspecified: Secondary | ICD-10-CM

## 2017-10-13 DIAGNOSIS — Z79899 Other long term (current) drug therapy: Secondary | ICD-10-CM | POA: Diagnosis not present

## 2017-10-13 LAB — COMPREHENSIVE METABOLIC PANEL
ALT: 15 U/L (ref 14–54)
AST: 15 U/L (ref 15–41)
Albumin: 3.5 g/dL (ref 3.5–5.0)
Alkaline Phosphatase: 65 U/L (ref 38–126)
Anion gap: 8 (ref 5–15)
BUN: 9 mg/dL (ref 6–20)
CHLORIDE: 108 mmol/L (ref 101–111)
CO2: 24 mmol/L (ref 22–32)
Calcium: 8.8 mg/dL — ABNORMAL LOW (ref 8.9–10.3)
Creatinine, Ser: 0.81 mg/dL (ref 0.44–1.00)
Glucose, Bld: 101 mg/dL — ABNORMAL HIGH (ref 65–99)
POTASSIUM: 4 mmol/L (ref 3.5–5.1)
Sodium: 140 mmol/L (ref 135–145)
Total Bilirubin: 0.4 mg/dL (ref 0.3–1.2)
Total Protein: 7.4 g/dL (ref 6.5–8.1)

## 2017-10-13 LAB — CBC WITH DIFFERENTIAL/PLATELET
BASOS ABS: 0 10*3/uL (ref 0.0–0.1)
Basophils Relative: 1 %
EOS PCT: 3 %
Eosinophils Absolute: 0.2 10*3/uL (ref 0.0–0.7)
HCT: 37.7 % (ref 36.0–46.0)
Hemoglobin: 12.1 g/dL (ref 12.0–15.0)
LYMPHS PCT: 40 %
Lymphs Abs: 3.1 10*3/uL (ref 0.7–4.0)
MCH: 28.2 pg (ref 26.0–34.0)
MCHC: 32.1 g/dL (ref 30.0–36.0)
MCV: 87.9 fL (ref 78.0–100.0)
MONO ABS: 0.6 10*3/uL (ref 0.1–1.0)
Monocytes Relative: 8 %
Neutro Abs: 3.7 10*3/uL (ref 1.7–7.7)
Neutrophils Relative %: 48 %
PLATELETS: 384 10*3/uL (ref 150–400)
RBC: 4.29 MIL/uL (ref 3.87–5.11)
RDW: 14.5 % (ref 11.5–15.5)
WBC: 7.7 10*3/uL (ref 4.0–10.5)

## 2017-10-13 LAB — URINALYSIS, ROUTINE W REFLEX MICROSCOPIC
BILIRUBIN URINE: NEGATIVE
Bacteria, UA: NONE SEEN
Glucose, UA: NEGATIVE mg/dL
Ketones, ur: NEGATIVE mg/dL
Leukocytes, UA: NEGATIVE
Nitrite: NEGATIVE
PH: 6 (ref 5.0–8.0)
Protein, ur: NEGATIVE mg/dL
SPECIFIC GRAVITY, URINE: 1.02 (ref 1.005–1.030)
SQUAMOUS EPITHELIAL / LPF: NONE SEEN

## 2017-10-13 LAB — WET PREP, GENITAL
Clue Cells Wet Prep HPF POC: NONE SEEN
SPERM: NONE SEEN
TRICH WET PREP: NONE SEEN
YEAST WET PREP: NONE SEEN

## 2017-10-13 LAB — SAMPLE TO BLOOD BANK

## 2017-10-13 LAB — I-STAT BETA HCG BLOOD, ED (MC, WL, AP ONLY)

## 2017-10-13 LAB — TSH: TSH: 2.073 u[IU]/mL (ref 0.350–4.500)

## 2017-10-13 MED ORDER — HYDROCODONE-ACETAMINOPHEN 5-325 MG PO TABS
2.0000 | ORAL_TABLET | Freq: Once | ORAL | Status: AC
  Administered 2017-10-13: 2 via ORAL
  Filled 2017-10-13: qty 2

## 2017-10-13 NOTE — ED Provider Notes (Signed)
[facility] DEPT Provider Note   CSN: [id number] Arrival date & time: 10/13/17  1951     History   Chief Complaint Chief Complaint  Patient presents with  . Vaginal Bleeding    HPI Carol Ortega is a 46 y.o. female.  HPI   Patient is a 46 year old female with a history of brain tumor, abnormal uterine bleeding, nephrolithiasis, status post parathyroidectomy, and abdominal surgical history significant for cholecystectomy, left fallopian tube removal and right tubal ligation presenting for acute on chronic abnormal uterine bleeding.  Patient reports she has been bleeding since September 19 with clots.  Prior to symptom onset, the patient reports that her menstrual periods have not been regular. Patient visited her primary care provider who treated with 10 days of estrogen, but patient reports that the bleeding returned, but heavier.  Patient reports that 3 days ago she began experiening lower abdominal cramping up to 10 out of 10 in severity.  This evening, patient reports that she had worse bleeding than she has had throughout this entire course and was bleeding through an overnight pad within 10 minutes.  Patient reports that this happened twice this evening.  Patient reports that this past week she has been slightly dizzy upon standing.  No palpitations, chest pain.  Patient reports she has had some soft stool over the past couple days, but no melena, hematochezia.  Patient denies any nausea or vomiting.  Patient reports that she has not had any sexual partners in the past 4-5 months.  Patient denies any abnormal vaginal discharge or odor.  Patient has a history of abnormal uterine bleeding 2 years ago, uterine ablation versus hysterectomy. No procedures performed yet. Patient has follow-up with a OB/GYN on 10/31.    Past Medical History:  Diagnosis Date  . Anemia   . Anxiety   . Asthma    seasonal  . Brain tumor ([place]) 2014   tx with radiation  .  Cancer ([place])   . Complication of anesthesia 1999   lung collapse after surgery for gallbladder  . Depression   . Headache    migraines  . Hoarseness of voice   . Hypertension   . Kidney stone   . Obesity   . Parathyroid abnormality ([place])   . Pulmonary emboli ([place]) 2011  . Radiation    Brain tumor  . UTI (urinary tract infection)   . Vitamin D deficiency     Patient Active Problem List   Diagnosis Date Noted  . Kidney stone 04/09/2017  . Hyperparathyroidism ([place]) 09/10/2015  . Vitamin D deficiency 06/25/2014  . Hypercalcemia 02/09/2014  . 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 CHOLECYSTECTOMY  1999  . CYSTOSCOPY W/ URETERAL STENT PLACEMENT Right 04/08/2017   Procedure: CYSTOSCOPY WITH RETROGRADE PYELOGRAM/URETERAL STENT PLACEMENT;  Surgeon: Rana Snare, MD;  [facility];  Service: Urology;  Laterality: Right;  . CYSTOSCOPY W/ URETEROSCOPY    . CYSTOSCOPY WITH RETROGRADE PYELOGRAM, URETEROSCOPY AND STENT PLACEMENT Left 01/20/2014   Procedure: LEFT URETEROSCOPY WITH STENT PLACEMENT;  Surgeon: Irine Seal, MD;  [facility];  Service: Urology;  Laterality: Left;  . CYSTOSCOPY WITH RETROGRADE PYELOGRAM, URETEROSCOPY AND STENT PLACEMENT Bilateral 04/23/2015   Procedure: CYSTOSCOPY WITH BILATERAL RETROGRADE PYELOGRAM, URETEROSCOPY AND STENT PLACEMENT;  Surgeon: Alexis Frock, MD;  [facility];  Service: Urology;  Laterality: Bilateral;  . CYSTOSCOPY WITH RETROGRADE PYELOGRAM, URETEROSCOPY AND STENT PLACEMENT Right 04/13/2017   Procedure: CYSTOSCOPY WITH  RETROGRADE PYELOGRAM, URETEROSCOPY AND STENT EXCHANGE;  Surgeon: Alexis Frock, MD;  [facility];  Service: Urology;  Laterality: Right;  . CYSTOSCOPY WITH STENT PLACEMENT Left 01/12/2014   Procedure: CYSTOSCOPY WITH STENT PLACEMENT left retrograde;  Surgeon: Irine Seal, MD;  [facility];  Service: Urology;  Laterality: Left;  . HOLMIUM LASER APPLICATION Left  04/20/85   Procedure: HOLMIUM LASER APPLICATION;  Surgeon: Irine Seal, MD;  [facility];  Service: Urology;  Laterality: Left;  . HOLMIUM LASER APPLICATION Bilateral 06/22/1949   Procedure: HOLMIUM LASER APPLICATION;  Surgeon: Alexis Frock, MD;  [facility];  Service: Urology;  Laterality: Bilateral;  . HOLMIUM LASER APPLICATION Right 9/32/6712   Procedure: HOLMIUM LASER APPLICATION;  Surgeon: Alexis Frock, MD;  [facility];  Service: Urology;  Laterality: Right;  . kidney stone removal    . LITHOTRIPSY    . PARATHYROIDECTOMY Right 11/13/2016   Procedure: RIGHT SUPERIOR PARATHYROIDECTOMY;  Surgeon: Armandina Gemma, MD;  Location: [facility];  Service: General;  Laterality: Right;  . surgery for,ectopic pregnancy  2011    1 fallopian tube rupture  . TUBAL LIGATION  2011    OB History    No data available       Home Medications    Prior to Admission medications   Medication Sig Start Date End Date Taking? Authorizing Provider  acetaminophen (TYLENOL) 500 MG tablet Take 1,000 mg by mouth every 6 (six) hours as needed.   Yes [provider]  albuterol (PROVENTIL HFA;VENTOLIN HFA) 108 (90 Base) MCG/ACT inhaler Inhale 2 puffs into the lungs every 6 (six) hours as needed for wheezing or shortness of breath.   Yes [provider]  albuterol (PROVENTIL) (2.5 MG/3ML) 0.083% nebulizer solution Inhale 2.5 mg into the lungs every 6 (six) hours as needed for wheezing or shortness of breath.    Yes [provider]  escitalopram (LEXAPRO) 10 MG tablet Take 10 mg by mouth daily.    Yes [provider]  lisinopril-hydrochlorothiazide (PRINZIDE,ZESTORETIC) 20-12.5 MG per tablet Take 1 tablet by mouth daily.    Yes [provider]  norethindrone (AYGESTIN) 5 MG tablet Take 5 mg by mouth daily. 10/01/17  Yes [provider]  pantoprazole (PROTONIX) 40 MG tablet Take 40 mg by mouth daily.    Yes [provider]  cephALEXin (KEFLEX) 500  MG capsule Take 1 capsule (500 mg total) by mouth 4 (four) times daily. Patient not taking: Reported on 10/13/2017 04/26/17   Milton Ferguson, MD  ketorolac (TORADOL) 10 MG tablet Take 1 tablet (10 mg total) by mouth every 6 (six) hours as needed. For mild pain / stent discomfort. Patient not taking: Reported on 10/13/2017 04/09/17   Alexis Frock, MD    Family History Family History  Problem Relation Age of Onset  . Bell's palsy Mother   . Heart failure Mother   . Asthma Father   . Hypertension Father     Social History Social History  Substance Use Topics  . Smoking status: Never Smoker  . Smokeless tobacco: Never Used  . Alcohol use Yes     Comment: holidays     Allergies   Bee venom; Contrast media [iodinated diagnostic agents]; Eggs or egg-derived products; Penicillins; Latex; and Oxycodone-acetaminophen   Review of Systems Review of Systems  Constitutional: Negative for chills and fever.  HENT: Negative for congestion, rhinorrhea, sinus pain and sore throat.   Eyes: Negative for visual disturbance.  Respiratory: Negative for cough, chest tightness and  shortness of breath.   Cardiovascular: Negative for chest pain, palpitations and leg swelling.  Gastrointestinal: Negative for nausea and vomiting.       + soft stool  Genitourinary: Positive for pelvic pain. Negative for dysuria and flank pain.  Musculoskeletal: Negative for back pain and myalgias.  Skin: Negative for rash.  Neurological: Positive for dizziness. Negative for syncope, light-headedness and headaches.     Physical Exam Updated Vital Signs BP (!) 148/103   Pulse 81   Temp 98.7 F (37.1 C) (Oral)   Resp 16   SpO2 100%   Physical Exam  Constitutional: She appears well-developed and well-nourished. No distress.  HENT:  Head: Normocephalic and atraumatic.  Mouth/Throat: Oropharynx is clear and moist.  Eyes: Pupils are equal, round, and reactive to light. Conjunctivae and EOM are normal.  Neck:  Normal range of motion. Neck supple.  Cardiovascular: Normal rate, regular rhythm, S1 normal and S2 normal.   No murmur heard. Pulmonary/Chest: Effort normal and breath sounds normal. She has no wheezes. She has no rales.  Abdominal: Soft. She exhibits no distension. There is tenderness. There is no guarding.  Suprapubic tenderness to palpation.  No guarding or rebound.  Genitourinary:  Genitourinary Comments: Exam performed with nurse chaperone present.  No external lesions of vagina or vulva.  Moderate amount of blood collecting in vaginal vault with no active hemorrhage.  No lacerations of vaginal wall noted. Cervix only partially visualized. No lacerations noted of cervix in view.  No erythema noted of cervix in view.    Musculoskeletal: Normal range of motion. She exhibits no edema or deformity.  Lymphadenopathy:    She has no cervical adenopathy.  Neurological: She is alert.  Cranial nerves grossly intact. Patient moves extremities with good coordination and symmetrically.  Skin: Skin is warm and dry. No rash noted. No erythema.  Psychiatric: She has a normal mood and affect. Her behavior is normal. Judgment and thought content normal.  Nursing note and vitals reviewed.    ED Treatments / Results  Labs (all labs ordered are listed, but only abnormal results are displayed) Labs Reviewed  WET PREP, GENITAL - Abnormal; Notable for the following:       Result Value   WBC, Wet Prep HPF POC FEW (*)    All other components within normal limits  COMPREHENSIVE METABOLIC PANEL - Abnormal; Notable for the following:    Glucose, Bld 101 (*)    Calcium 8.8 (*)    All other components within normal limits  CBC WITH DIFFERENTIAL/PLATELET  TSH  RPR  HIV ANTIBODY (ROUTINE TESTING)  URINALYSIS, ROUTINE W REFLEX MICROSCOPIC  I-STAT BETA HCG BLOOD, ED ([facility], [facility], [facility] ONLY)  SAMPLE TO BLOOD BANK  GC/CHLAMYDIA PROBE AMP ([facility]) NOT AT [facility]    EKG  EKG Interpretation None        Radiology No results found.  Procedures Procedures (including critical care time)  Medications Ordered in ED Medications  HYDROcodone-acetaminophen (NORCO/VICODIN) 5-325 MG per tablet 2 tablet (2 tablets Oral Given 10/13/17 2154)     Initial Impression / Assessment and Plan / ED Course  I have reviewed the triage vital signs and the nursing notes.  Pertinent labs & imaging results that were available during my care of the patient were reviewed by me and considered in my medical decision making (see chart for details).  Clinical Course as of Oct 14 2243  Sat Oct 13, 2017  2243 Patient reevaluated.  Patient reports that she has less sharp  pain after Vicodin.  [AM]    Clinical Course User Index [AM] Albesa Seen, PA-C    Final Clinical Impressions(s) / ED Diagnoses   Final diagnoses:  None   Patient is well-appearing is hemodynamically stable, and in no acute distress.  Moderate amount of blood noted on vaginal exam but no active hemorrhage.    Hemoglobin 12.1.  Platelets 384.  No abnormalities noted on CMP, urinalysis, or wet prep.  TSH 2.073 today.  Transvaginal ultrasound was sub optimal view demonstrates no fibroids or uterine masses.  Ovaries were unable to be visualized.  Flow of blood diminished during emergency department visit.  Patient not having bleeding to the same quantity she had prior this evening.  Given that patient's vital signs are stable, her hemoglobin is stable at 12.1, patient should be stable for follow-up outpatient at her previously scheduled visit on October 31.  Patient symptoms are likely acute on chronic dysmenorrhea and abnormal uterine bleeding.  Patient's abdominal tenderness with suprapubic and unlikely to be caused by other abdominal pathologies.  Patient given return precautions for any increasing bleeding that is of the same character and quality she experienced earlier this evening with persistent soaking through large pads multiple times  in an hour.  Patient is in understanding and agrees with the plan of care.  This is a shared visit with Dr. Orlie Dakin. Patient was independently evaluated by this attending physician. Attending physician consulted in evaluation and discharge management.  New Prescriptions New Prescriptions   No medications on file     Tamala Julian 10/14/17 [place], [place], PA-C 10/14/17 0251    Orlie Dakin, MD 10/15/17 [phone]

## 2017-10-13 NOTE — ED Notes (Signed)
Pt unable to provide urine specimen. Will use call bell when ready.

## 2017-10-13 NOTE — ED Provider Notes (Signed)
MSE was initiated and I personally evaluated the patient and placed orders (if any) at  8:13 PM on October 13, 2017.  The patient appears stable so that the remainder of the MSE may be completed by another provider.   Carol Ortega is a 46 y.o. female complaining of heavy vaginal bleeding onset 41 days ago, she initially started menstruating September 21, she states that it was very heavy she was passing large clots.  This is not atypical for her, she saw her primary care physician who wrote her a prescription for unknown oral medication to stop the bleeding for 2 days and then it returned, she states that she is soaking through a heavy pad every 10 minutes.  She denies any palpitations, shortness of breath, dyspnea on exertion but she does feel very fatigued.  She has an appointment with her OB/GYN in [facility] on the 31st of this month.  She states that in the past she had seen his OB/GYN and it was recommended that she have an ablation or hysterectomy.  She has not followed with them.  She does not have fibroids.  She has cramping lower abdominal pain which has been taking acetaminophen for with little relief.   Carol Ortega 10/13/17 2015    Orlie Dakin, MD 10/13/17 2127

## 2017-10-13 NOTE — ED Provider Notes (Signed)
Complains of vaginal bleeding onset September 05, 2017 accompanied by lower abdominal cramping which started gradually possibly 4 days ago and is constant.  Treat herself with Tylenol, without relief.  No fever.  No urinary symptoms.  No vomiting.  No other associated symptoms on exam no distress abdomen is morbidly obese, nontender   Orlie Dakin, MD 10/13/17 2128

## 2017-10-13 NOTE — ED Triage Notes (Signed)
Pt states she has been experiencing heavy and painful menstrual period for over a month. Although evaluated by pcp and gyn, today she noticed that the bleeding was excessive to a point she is "saturating 1pad/46minutes." Reports mild dizziness and taking tylenol for the pain without getting much relief.

## 2017-10-14 LAB — RPR: RPR: NONREACTIVE

## 2017-10-14 LAB — HIV ANTIBODY (ROUTINE TESTING W REFLEX): HIV SCREEN 4TH GENERATION: NONREACTIVE

## 2017-10-14 MED ORDER — HYDROCODONE-ACETAMINOPHEN 5-325 MG PO TABS: 1.0000 | ORAL_TABLET | ORAL | 0 refills | Status: DC | PRN

## 2017-10-14 NOTE — Discharge Instructions (Signed)
Please see the information and instructions below regarding your visit.  Your diagnoses today include:  1. Abnormal uterine bleeding    Your exam and testing was reassuring today.  We did not see any fibroids or masses in the uterus on your ultrasound.  Your hemoglobin which is your oxygen-carrying capacity and the blood is normal today.  Your platelets are normal today.  Your urine shows no infection.  Tests performed today include: See side panel of your discharge paperwork for testing performed today. Vital signs are listed at the bottom of these instructions.   Medications prescribed:    Take any prescribed medications only as prescribed, and any over the counter medications only as directed on the packaging.  You have been prescribed Norco for pain. This is an opioid pain medication. You may take this medication every 4-6 hours as needed for pain. Only take this medication if you need it for breakthrough pain.   Do not combine this medication with Tylenol, as it may increase the risk of liver problems.  Do not combine this medication with alcohol.  Please be advised to avoid driving or operating heavy machinery while taking this medication, as it may make you drowsy or impair judgment.    Home care instructions:  Please follow any educational materials contained in this packet.   Follow-up instructions: Please follow-up with your OB/GYN on October 31 at your previously scheduled appointment.  Return instructions:  Please return to the Emergency Department if you experience worsening symptoms.  Please return to the emergency department for vaginal bleeding where you are soaking through pads at least once an hour for 4-5 hours straight  without letting up or stopping. Please return if you have any other emergent concerns.  Additional Information:   Your vital signs today were: BP (!) 148/103    Pulse 81    Temp 98.7 F (37.1 C) (Oral)    Resp 16    SpO2 100%  If your blood  pressure (BP) was elevated on multiple readings during this visit above 130 for the top number or above 80 for the bottom number, please have this repeated by your primary care provider within one month. --------------  Thank you for allowing [place] to participate in your care today.

## 2017-10-15 LAB — GC/CHLAMYDIA PROBE AMP (~~LOC~~) NOT AT ARMC
Chlamydia: NEGATIVE
NEISSERIA GONORRHEA: NEGATIVE

## 2017-11-10 ENCOUNTER — Emergency Department (HOSPITAL_COMMUNITY)
Admission: EM | Admit: 2017-11-10 | Discharge: 2017-11-10 | Disposition: A | Discharge status: Home / Self Care | Payer: Medicaid Other | Attending: Emergency Medicine | Admitting: Emergency Medicine

## 2017-11-10 ENCOUNTER — Emergency Department (HOSPITAL_COMMUNITY): Payer: Medicaid Other

## 2017-11-10 ENCOUNTER — Encounter (HOSPITAL_COMMUNITY): Payer: Self-pay | Admitting: Nurse Practitioner

## 2017-11-10 DIAGNOSIS — J45909 Unspecified asthma, uncomplicated: Secondary | ICD-10-CM | POA: Insufficient documentation

## 2017-11-10 DIAGNOSIS — Z79899 Other long term (current) drug therapy: Secondary | ICD-10-CM | POA: Insufficient documentation

## 2017-11-10 DIAGNOSIS — I10 Essential (primary) hypertension: Secondary | ICD-10-CM | POA: Insufficient documentation

## 2017-11-10 DIAGNOSIS — L03213 Periorbital cellulitis: Secondary | ICD-10-CM | POA: Diagnosis not present

## 2017-11-10 DIAGNOSIS — R220 Localized swelling, mass and lump, head: Secondary | ICD-10-CM | POA: Diagnosis present

## 2017-11-10 DIAGNOSIS — R51 Headache: Secondary | ICD-10-CM | POA: Insufficient documentation

## 2017-11-10 DIAGNOSIS — G44209 Tension-type headache, unspecified, not intractable: Secondary | ICD-10-CM

## 2017-11-10 LAB — CBC WITH DIFFERENTIAL/PLATELET
Basophils Absolute: 0.1 10*3/uL (ref 0.0–0.1)
Basophils Relative: 1 %
EOS ABS: 0.3 10*3/uL (ref 0.0–0.7)
Eosinophils Relative: 3 %
HEMATOCRIT: 37.3 % (ref 36.0–46.0)
HEMOGLOBIN: 11.8 g/dL — AB (ref 12.0–15.0)
Lymphocytes Relative: 35 %
Lymphs Abs: 3 10*3/uL (ref 0.7–4.0)
MCH: 28 pg (ref 26.0–34.0)
MCHC: 31.6 g/dL (ref 30.0–36.0)
MCV: 88.4 fL (ref 78.0–100.0)
MONOS PCT: 10 %
Monocytes Absolute: 0.8 10*3/uL (ref 0.1–1.0)
Neutro Abs: 4.4 10*3/uL (ref 1.7–7.7)
Neutrophils Relative %: 51 %
Platelets: 361 10*3/uL (ref 150–400)
RBC: 4.22 MIL/uL (ref 3.87–5.11)
RDW: 14.7 % (ref 11.5–15.5)
WBC: 8.6 10*3/uL (ref 4.0–10.5)

## 2017-11-10 LAB — BASIC METABOLIC PANEL
ANION GAP: 7 (ref 5–15)
BUN: 13 mg/dL (ref 6–20)
CALCIUM: 9.3 mg/dL (ref 8.9–10.3)
CO2: 30 mmol/L (ref 22–32)
CREATININE: 0.82 mg/dL (ref 0.44–1.00)
Chloride: 103 mmol/L (ref 101–111)
GFR calc Af Amer: >60 mL/min (ref >60)
GLUCOSE: 93 mg/dL (ref 65–99)
Potassium: 3.9 mmol/L (ref 3.5–5.1)
Sodium: 140 mmol/L (ref 135–145)

## 2017-11-10 MED ORDER — KETOROLAC TROMETHAMINE 30 MG/ML IJ SOLN
30.0000 mg | Freq: Once | INTRAMUSCULAR | Status: AC
  Administered 2017-11-10: 30 mg via INTRAVENOUS
  Filled 2017-11-10: qty 1

## 2017-11-10 MED ORDER — CLINDAMYCIN HCL 150 MG PO CAPS: 450.0000 mg | ORAL_CAPSULE | Freq: Three times a day (TID) | ORAL | 0 refills | Status: AC

## 2017-11-10 MED ORDER — METOCLOPRAMIDE HCL 5 MG/ML IJ SOLN
10.0000 mg | Freq: Once | INTRAMUSCULAR | Status: AC
  Administered 2017-11-10: 10 mg via INTRAVENOUS
  Filled 2017-11-10: qty 2

## 2017-11-10 NOTE — ED Provider Notes (Signed)
Emergency Department Provider Note   I have reviewed the triage vital signs and the nursing notes.   HISTORY  Chief Complaint Facial Pain   HPI Carol Ortega is a 46 y.o. female with PMH of asthma, meningioma, HTN, and history of migraine HA presents to the emergency department for evaluation of left face swelling with intermittent shooting pains.  Patient is developed an associated headache with some photophobia.  She denies any numbness or weakness in the face, arms, legs.  No difficulty ambulating.  No fevers or chills.  She reports some continued mild blurry vision but no acute change in her vision.  No modifying factors.  No provoking symptoms.  She states that she was supposed to have her meningioma imaged in the summer of this past year through her neurologist at [facility] but missed that appointment because of several deaths in the family.   Patient states that over the last 2 days her left face and eye swelling were significantly worse but have improved with applying a cool compress.  Today the swelling is decreased and her eyes now open.    Past Medical History:  Diagnosis Date  . Anemia   . Anxiety   . Asthma    seasonal  . Brain tumor ([place]) 2014   tx with radiation  . Cancer ([place])   . Complication of anesthesia 1999   lung collapse after surgery for gallbladder  . Depression   . Headache    migraines  . Hoarseness of voice   . Hypertension   . Kidney stone   . Obesity   . Parathyroid abnormality ([place])   . Pulmonary emboli ([place]) 2011  . Radiation    Brain tumor  . UTI (urinary tract infection)   . Vitamin D deficiency     Patient Active Problem List   Diagnosis Date Noted  . Kidney stone 04/09/2017  . Hyperparathyroidism ([place]) 09/10/2015  . Vitamin D deficiency 06/25/2014  . Hypercalcemia 02/09/2014  . 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 CHOLECYSTECTOMY  1999  . CYSTOSCOPY W/ URETERAL  STENT PLACEMENT Right 04/08/2017   Procedure: CYSTOSCOPY WITH RETROGRADE PYELOGRAM/URETERAL STENT PLACEMENT;  Surgeon: Rana Snare, MD;  Location: [facility];  Service: Urology;  Laterality: Right;  . CYSTOSCOPY W/ URETEROSCOPY    . CYSTOSCOPY WITH RETROGRADE PYELOGRAM, URETEROSCOPY AND STENT PLACEMENT Left 01/20/2014   Procedure: LEFT URETEROSCOPY WITH STENT PLACEMENT;  Surgeon: Irine Seal, MD;  Location: [facility];  Service: Urology;  Laterality: Left;  . CYSTOSCOPY WITH RETROGRADE PYELOGRAM, URETEROSCOPY AND STENT PLACEMENT Bilateral 04/23/2015   Procedure: CYSTOSCOPY WITH BILATERAL RETROGRADE PYELOGRAM, URETEROSCOPY AND STENT PLACEMENT;  Surgeon: Alexis Frock, MD;  Location: [facility];  Service: Urology;  Laterality: Bilateral;  . CYSTOSCOPY WITH RETROGRADE PYELOGRAM, URETEROSCOPY AND STENT PLACEMENT Right 04/13/2017   Procedure: CYSTOSCOPY WITH RETROGRADE PYELOGRAM, URETEROSCOPY AND STENT EXCHANGE;  Surgeon: Alexis Frock, MD;  Location: [facility];  Service: Urology;  Laterality: Right;  . CYSTOSCOPY WITH STENT PLACEMENT Left 01/12/2014   Procedure: CYSTOSCOPY WITH STENT PLACEMENT left retrograde;  Surgeon: Irine Seal, MD;  Location: [facility];  Service: Urology;  Laterality: Left;  . HOLMIUM LASER APPLICATION Left 07/22/1323   Procedure: HOLMIUM LASER APPLICATION;  Surgeon: Irine Seal, MD;  Location: [facility];  Service: Urology;  Laterality: Left;  . HOLMIUM LASER APPLICATION Bilateral 4/0/1027   Procedure: HOLMIUM LASER APPLICATION;  Surgeon: Alexis Frock, MD;  Location: [facility];  Service: Urology;  Laterality:  Bilateral;  . HOLMIUM LASER APPLICATION Right 8/52/7782   Procedure: HOLMIUM LASER APPLICATION;  Surgeon: Alexis Frock, MD;  Location: [facility];  Service: Urology;  Laterality: Right;  . kidney stone removal    . LITHOTRIPSY    . PARATHYROIDECTOMY Right 11/13/2016   Procedure: RIGHT SUPERIOR PARATHYROIDECTOMY;  Surgeon: Armandina Gemma, MD;  Location: [place];  Service: General;  Laterality: Right;  . surgery  for,ectopic pregnancy  2011    1 fallopian tube rupture  . TUBAL LIGATION  2011    Current Outpatient Rx  . Order #: [id number] Class: Historical Med  . Order #: [id number] Class: Historical Med  . Order #: [id number] Class: Historical Med  . Order #: [id number] Class: Print  . Order #: [id number] Class: Print  . Order #: [id number] Class: Historical Med  . Order #: [id number] Class: Print  . Order #: [id number] Class: Print  . Order #: [id number] Class: Historical Med  . Order #: [id number] Class: Historical Med  . Order #: [id number] Class: Historical Med    Allergies Bee venom; Contrast media [iodinated diagnostic agents]; Eggs or egg-derived products; Penicillins; Latex; and Oxycodone-acetaminophen  Family History  Problem Relation Age of Onset  . Bell's palsy Mother   . Heart failure Mother   . Asthma Father   . Hypertension Father     Social History Social History   Tobacco Use  . Smoking status: Never Smoker  . Smokeless tobacco: Never Used  Substance Use Topics  . Alcohol use: Yes    Comment: holidays  . Drug use: No    Review of Systems  Constitutional: No fever/chills Eyes: No visual changes.  ENT: No sore throat. Positive swelling of the left face and eye.  Cardiovascular: Denies chest pain. Respiratory: Denies shortness of breath. Gastrointestinal: No abdominal pain.  No nausea, no vomiting.  No diarrhea.  No constipation. Genitourinary: Negative for dysuria. Musculoskeletal: Negative for back pain. Skin: Negative for rash. Neurological: Negative for focal weakness or numbness. Positive HA.   10-point ROS otherwise negative.  ____________________________________________   PHYSICAL EXAM:  VITAL SIGNS: ED Triage Vitals  Enc Vitals Group     BP 11/10/17 1819 (!) 151/122     Pulse Rate 11/10/17 1819 90     Resp 11/10/17 1819 18     Temp 11/10/17 1819 98.2 F (36.8 C)     Temp Source 11/10/17 1819 Oral     SpO2 11/10/17 1819 96 %     Weight 11/10/17 1819 (!)  307 lb (139.3 kg)     Height 11/10/17 1819 5\' 6"  (1.676 m)     Pain Score 11/10/17 1841 3   Constitutional: Alert and oriented. Well appearing and in no acute distress. Eyes: Conjunctivae are normal. PERRL. EOMI. Mild periorbital swelling involving the upper and lower lids and left cheek.  Head: Atraumatic. Ears:  Healthy appearing ear canals and TMs bilaterally Nose: No congestion/rhinnorhea. Mouth/Throat: Mucous membranes are moist.  Oropharynx non-erythematous. Neck: No stridor.  No meningeal signs.  Cardiovascular: Normal rate, regular rhythm. Good peripheral circulation. Grossly normal heart sounds.   Respiratory: Normal respiratory effort.  No retractions. Lungs CTAB. Gastrointestinal: Soft and nontender. No distention.  Musculoskeletal: No lower extremity tenderness nor edema. No gross deformities of extremities. Neurologic:  Normal speech and language. No gross focal neurologic deficits are appreciated. Normal CN exam 2-12. No pronator drift.  Skin:  Skin is warm, dry and intact. Mild erythema around the left eye.   ____________________________________________   LABS (all labs ordered are listed, but only abnormal results are displayed)  Labs Reviewed  CBC WITH DIFFERENTIAL/PLATELET - Abnormal; Notable for the following components:      Result Value   Hemoglobin 11.8 (*)    All other components within normal limits  BASIC METABOLIC PANEL   ____________________________________________  RADIOLOGY  Ct Head Wo Contrast  Result Date: 11/10/2017 CLINICAL DATA:  Left-sided facial discomfort EXAM: CT HEAD WITHOUT CONTRAST TECHNIQUE: Contiguous axial images were obtained from the base of the skull through the vertex without intravenous contrast. COMPARISON:  Head CT 05/27/2014 Brain MRI 04/27/2014 FINDINGS: Brain: There is hyperdensity and calcification at the left middle cranial fossa an orbital apex at the site of known meningioma. Extent of the lesion is poorly characterized by  this study, but the size is unchanged relative to the prior CT performed 05/27/2014. The calcified component of the mass is unchanged. There is no abnormality of the anterior temporal lobe. Brain parenchyma is otherwise normal. Brain parenchyma and CSF-containing spaces are normal for age. Vascular: No hyperdense vessel or unexpected calcification. Skull: Normal visualized skull base, calvarium and extracranial soft tissues. Sinuses/Orbits: No sinus fluid levels or advanced mucosal thickening. No mastoid effusion. Normal orbits. IMPRESSION: 1. No acute intracranial abnormality. 2. Unchanged appearance of left middle cranial fossa meningioma compared to CT of 05/27/2014. However, accurate measurement and detailed assessment of the mass is not possible on this study. There is no edema within the adjacent brain. If there is clinical concern of invasion into the orbital apex or the exit foramina of the trigeminal nerve complex, MRI with and without contrast is recommended. Electronically Signed   By: Ulyses Jarred M.D.   On: 11/10/2017 21:58    ____________________________________________   PROCEDURES  Procedure(s) performed:   Procedures  None ____________________________________________   INITIAL IMPRESSION / ASSESSMENT AND PLAN / ED COURSE  Pertinent labs & imaging results that were available during my care of the patient were reviewed by me and considered in my medical decision making (see chart for details).  Patient presents to the emergency department for evaluation of headache similar to migraine headaches in the past with left face and eye swelling that is improving over the past 2 days with cool compresses.  She has mild redness around the eye.  No pain with extraocular movements.  No acute vision changes.  She has a normal neurological exam.  She does have a known meningioma on the same side.  Given her headache and no meningioma plan for CT imaging of the head to evaluate for surrounding  edema or other acute change but have low suspicion that this is causing her symptoms.  Plan for Toradol treatment of headache with reassessment.  10:30 PM Patient is feeling better after Toradol.  CT imaging, although limited, shows no surrounding edema or significant increase in meningioma size.  No indication for advanced head imaging at this time given the patient's improving symptoms.  I have encouraged her to follow with her neurologist.  She will continue to apply cool compresses to the face and eye.  Will discharge with antibiotics to cover possible developing periorbital cellulitis.   At this time, I do not feel there is any life-threatening condition present. I have reviewed and discussed all results (EKG, imaging, lab, urine as appropriate), exam findings with patient. I have reviewed nursing notes and appropriate previous records.  I feel the patient is safe to be discharged home without further emergent workup. Discussed usual and customary return precautions. Patient and family (if present) verbalize understanding and are comfortable with this plan.  Patient will follow-up with their primary care provider. If they do not have a primary care provider, information for follow-up has been provided to them. All questions have been answered. ____________________________________________  FINAL CLINICAL IMPRESSION(S) / ED DIAGNOSES  Final diagnoses:  Periorbital cellulitis of left eye  Acute non intractable tension-type headache     MEDICATIONS GIVEN DURING THIS VISIT:  Medications  ketorolac (TORADOL) 30 MG/ML injection 30 mg (30 mg Intravenous Given 11/10/17 2111)  metoCLOPramide (REGLAN) injection 10 mg (10 mg Intravenous Given 11/10/17 2108)     NEW OUTPATIENT MEDICATIONS STARTED DURING THIS VISIT:  Clindamycin   Note:  This document was prepared using Dragon voice recognition software and may include unintentional dictation errors.  Nanda Quinton, MD Emergency Medicine      Jala Dundon, [facility], MD 11/10/17 2240

## 2017-11-10 NOTE — Discharge Instructions (Signed)
You were seen in the ED today with pain and swelling around the left eye/face. The CT scan was unchanged from prior but you do need to call your PCP to discuss you follow up meningioma imaging. There is nothing to suggest that the meningioma is causing your current symptoms. Return to the ED with any sudden changes in vision, difficulty walking, weakness, or numbness. Take the antibiotics as directed and follow up with your PCP.

## 2017-11-10 NOTE — ED Triage Notes (Signed)
Pt is c/o left sided facial discomfort/intermittent shooting pain. States started with unilateral swelling to the same side. Remark on hx of tumor followed by neurology. Denies HA at this time.

## 2018-01-18 ENCOUNTER — Emergency Department (HOSPITAL_COMMUNITY)
Admission: EM | Admit: 2018-01-18 | Discharge: 2018-01-19 | Disposition: A | Discharge status: Home / Self Care | Payer: Medicaid Other | Attending: Emergency Medicine | Admitting: Emergency Medicine

## 2018-01-18 ENCOUNTER — Other Ambulatory Visit: Payer: Self-pay

## 2018-01-18 ENCOUNTER — Encounter (HOSPITAL_COMMUNITY): Payer: Self-pay | Admitting: Oncology

## 2018-01-18 DIAGNOSIS — Z87442 Personal history of urinary calculi: Secondary | ICD-10-CM | POA: Diagnosis not present

## 2018-01-18 DIAGNOSIS — N23 Unspecified renal colic: Secondary | ICD-10-CM | POA: Insufficient documentation

## 2018-01-18 DIAGNOSIS — Z91040 Latex allergy status: Secondary | ICD-10-CM | POA: Insufficient documentation

## 2018-01-18 DIAGNOSIS — Z79899 Other long term (current) drug therapy: Secondary | ICD-10-CM | POA: Diagnosis not present

## 2018-01-18 DIAGNOSIS — I10 Essential (primary) hypertension: Secondary | ICD-10-CM | POA: Insufficient documentation

## 2018-01-18 DIAGNOSIS — R109 Unspecified abdominal pain: Secondary | ICD-10-CM | POA: Diagnosis present

## 2018-01-18 LAB — CBC
HCT: 38.4 % (ref 36.0–46.0)
HEMOGLOBIN: 12.4 g/dL (ref 12.0–15.0)
MCH: 27.5 pg (ref 26.0–34.0)
MCHC: 32.3 g/dL (ref 30.0–36.0)
MCV: 85.1 fL (ref 78.0–100.0)
PLATELETS: 345 10*3/uL (ref 150–400)
RBC: 4.51 MIL/uL (ref 3.87–5.11)
RDW: 14.6 % (ref 11.5–15.5)
WBC: 8.8 10*3/uL (ref 4.0–10.5)

## 2018-01-18 LAB — BASIC METABOLIC PANEL
ANION GAP: 8 (ref 5–15)
BUN: 11 mg/dL (ref 6–20)
CALCIUM: 8.9 mg/dL (ref 8.9–10.3)
CO2: 26 mmol/L (ref 22–32)
CREATININE: 0.75 mg/dL (ref 0.44–1.00)
Chloride: 106 mmol/L (ref 101–111)
GFR calc Af Amer: >60 mL/min (ref >60)
GLUCOSE: 108 mg/dL — AB (ref 65–99)
Potassium: 3.9 mmol/L (ref 3.5–5.1)
Sodium: 140 mmol/L (ref 135–145)

## 2018-01-18 NOTE — ED Triage Notes (Signed)
Pt c/o left sided flank pain x several days that became intense today.  Pt has hx of kidney stones and states that this feels the same.

## 2018-01-19 ENCOUNTER — Emergency Department (HOSPITAL_COMMUNITY): Payer: Medicaid Other

## 2018-01-19 LAB — URINALYSIS, ROUTINE W REFLEX MICROSCOPIC
Bacteria, UA: NONE SEEN
Bilirubin Urine: NEGATIVE
GLUCOSE, UA: NEGATIVE mg/dL
Ketones, ur: NEGATIVE mg/dL
Leukocytes, UA: NEGATIVE
NITRITE: NEGATIVE
Protein, ur: 100 mg/dL — AB
SPECIFIC GRAVITY, URINE: 1.031 — AB (ref 1.005–1.030)
pH: 6 (ref 5.0–8.0)

## 2018-01-19 LAB — URINALYSIS, MICROSCOPIC (REFLEX)

## 2018-01-19 LAB — POC URINE PREG, ED: Preg Test, Ur: NEGATIVE

## 2018-01-19 MED ORDER — DICLOFENAC SODIUM ER 100 MG PO TB24: 100.0000 mg | ORAL_TABLET | Freq: Every day | ORAL | 0 refills | Status: DC

## 2018-01-19 MED ORDER — MORPHINE SULFATE (PF) 4 MG/ML IV SOLN
4.0000 mg | INTRAVENOUS | Status: DC | PRN
  Administered 2018-01-19: 4 mg via INTRAVENOUS
  Filled 2018-01-19: qty 1

## 2018-01-19 MED ORDER — HYDROCODONE-ACETAMINOPHEN 5-325 MG PO TABS: 1.0000 | ORAL_TABLET | ORAL | 0 refills | Status: DC | PRN

## 2018-01-19 MED ORDER — SODIUM CHLORIDE 0.9 % IV BOLUS (SEPSIS)
250.0000 mL | Freq: Once | INTRAVENOUS | Status: AC
  Administered 2018-01-19: 250 mL via INTRAVENOUS

## 2018-01-19 MED ORDER — KETOROLAC TROMETHAMINE 30 MG/ML IJ SOLN
30.0000 mg | Freq: Once | INTRAMUSCULAR | Status: AC
  Administered 2018-01-19: 30 mg via INTRAVENOUS
  Filled 2018-01-19: qty 1

## 2018-01-19 MED ORDER — TAMSULOSIN HCL 0.4 MG PO CAPS: 0.4000 mg | ORAL_CAPSULE | Freq: Two times a day (BID) | ORAL | 0 refills | Status: DC

## 2018-01-19 MED ORDER — MORPHINE SULFATE 15 MG PO TABS
15.0000 mg | ORAL_TABLET | Freq: Once | ORAL | Status: AC
  Administered 2018-01-19: 15 mg via ORAL
  Filled 2018-01-19: qty 1

## 2018-01-19 MED ORDER — ONDANSETRON HCL 4 MG/2ML IJ SOLN
4.0000 mg | Freq: Once | INTRAMUSCULAR | Status: AC
  Administered 2018-01-19: 4 mg via INTRAVENOUS
  Filled 2018-01-19: qty 2

## 2018-01-19 MED ORDER — ONDANSETRON 8 MG PO TBDP: ORAL_TABLET | ORAL | 0 refills | Status: DC

## 2018-01-19 NOTE — ED Provider Notes (Signed)
[facility] DEPT Provider Note   CSN: [id number] Arrival date & time: 01/18/18  2006     History   Chief Complaint Chief Complaint  Patient presents with  . Flank Pain    HPI Carol Ortega is a 47 y.o. female with a hx of HTN, kidney stones, asthma, anemia, brain tumor presents to the Emergency Department complaining of gradual, persistent, progressively worsening left flank pain onset 3 days ago but significantly worsening today.  Pt reports the pain radiates from her left flank into her LLQ.  She reports these symptoms are the same as her normal kidney stone pain.  Pt reports she has had uncontrolled vomiting and pain today.  Pt reports taking ibuprofen without significant relief.  Pt reports her urine smells strong, but no dysuria or hematuria.  Pt reports she had had urgency and frequency as well. Pt denies fever, chills, headache, neck pain, chest pain, sob, weakness, dizziness, syncope.      The history is provided by the patient and medical records. No language interpreter was used.    Past Medical History:  Diagnosis Date  . Anemia   . Anxiety   . Asthma    seasonal  . Brain tumor ([place]) 2014   tx with radiation  . Cancer ([place])   . Complication of anesthesia 1999   lung collapse after surgery for gallbladder  . Depression   . Headache    migraines  . Hoarseness of voice   . Hypertension   . Kidney stone   . Obesity   . Parathyroid abnormality ([place])   . Pulmonary emboli ([place]) 2011  . Radiation    Brain tumor  . UTI (urinary tract infection)   . Vitamin D deficiency     Patient Active Problem List   Diagnosis Date Noted  . Kidney stone 04/09/2017  . Hyperparathyroidism ([place]) 09/10/2015  . Vitamin D deficiency 06/25/2014  . Hypercalcemia 02/09/2014  . 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 CHOLECYSTECTOMY  1999  . CYSTOSCOPY W/ URETERAL STENT PLACEMENT Right  04/08/2017   Procedure: CYSTOSCOPY WITH RETROGRADE PYELOGRAM/URETERAL STENT PLACEMENT;  Surgeon: Rana Snare, MD;  Location: [facility];  Service: Urology;  Laterality: Right;  . CYSTOSCOPY W/ URETEROSCOPY    . CYSTOSCOPY WITH RETROGRADE PYELOGRAM, URETEROSCOPY AND STENT PLACEMENT Left 01/20/2014   Procedure: LEFT URETEROSCOPY WITH STENT PLACEMENT;  Surgeon: Irine Seal, MD;  Location: [facility];  Service: Urology;  Laterality: Left;  . CYSTOSCOPY WITH RETROGRADE PYELOGRAM, URETEROSCOPY AND STENT PLACEMENT Bilateral 04/23/2015   Procedure: CYSTOSCOPY WITH BILATERAL RETROGRADE PYELOGRAM, URETEROSCOPY AND STENT PLACEMENT;  Surgeon: Alexis Frock, MD;  Location: [facility];  Service: Urology;  Laterality: Bilateral;  . CYSTOSCOPY WITH RETROGRADE PYELOGRAM, URETEROSCOPY AND STENT PLACEMENT Right 04/13/2017   Procedure: CYSTOSCOPY WITH RETROGRADE PYELOGRAM, URETEROSCOPY AND STENT EXCHANGE;  Surgeon: Alexis Frock, MD;  Location: [facility];  Service: Urology;  Laterality: Right;  . CYSTOSCOPY WITH STENT PLACEMENT Left 01/12/2014   Procedure: CYSTOSCOPY WITH STENT PLACEMENT left retrograde;  Surgeon: Irine Seal, MD;  Location: [facility];  Service: Urology;  Laterality: Left;  . HOLMIUM LASER APPLICATION Left 0/12/7508   Procedure: HOLMIUM LASER APPLICATION;  Surgeon: Irine Seal, MD;  Location: [facility];  Service: Urology;  Laterality: Left;  . HOLMIUM LASER APPLICATION Bilateral 01/22/8526   Procedure: HOLMIUM LASER APPLICATION;  Surgeon: Alexis Frock, MD;  Location: [facility];  Service: Urology;  Laterality: Bilateral;  . HOLMIUM LASER APPLICATION Right  04/13/2017   Procedure: HOLMIUM LASER APPLICATION;  Surgeon: Alexis Frock, MD;  Location: [facility];  Service: Urology;  Laterality: Right;  . kidney stone removal    . LITHOTRIPSY    . PARATHYROIDECTOMY Right 11/13/2016   Procedure: RIGHT SUPERIOR PARATHYROIDECTOMY;  Surgeon: Armandina Gemma, MD;  Location: [facility];  Service: General;  Laterality: Right;  . surgery for,ectopic pregnancy   2011    1 fallopian tube rupture  . TUBAL LIGATION  2011    OB History    No data available       Home Medications    Prior to Admission medications   Medication Sig Start Date End Date Taking? Authorizing Provider  acetaminophen (TYLENOL) 500 MG tablet Take 1,000 mg by mouth every 6 (six) hours as needed for mild pain, moderate pain, fever or headache.    Yes [provider]  albuterol (PROVENTIL HFA;VENTOLIN HFA) 108 (90 Base) MCG/ACT inhaler Inhale 2 puffs into the lungs every 6 (six) hours as needed for wheezing or shortness of breath.   Yes [provider]  albuterol (PROVENTIL) (2.5 MG/3ML) 0.083% nebulizer solution Inhale 2.5 mg into the lungs every 6 (six) hours as needed for wheezing or shortness of breath.    Yes [provider]  lisinopril-hydrochlorothiazide (PRINZIDE,ZESTORETIC) 20-12.5 MG per tablet Take 1 tablet by mouth daily.    Yes [provider]  pantoprazole (PROTONIX) 40 MG tablet Take 40 mg by mouth daily.    Yes [provider]  Diclofenac Sodium CR (VOLTAREN-XR) 100 MG 24 hr tablet Take 1 tablet (100 mg total) by mouth daily. 01/19/18   Kolin Erdahl, Jarrett Soho, PA-C  HYDROcodone-acetaminophen (NORCO/VICODIN) 5-325 MG tablet Take 1 tablet by mouth every 4 (four) hours as needed. 01/19/18   Aluna Whiston, Jarrett Soho, PA-C  ondansetron (ZOFRAN ODT) 8 MG disintegrating tablet 8mg  ODT q4 hours prn nausea 01/19/18   Ronald Vinsant, Jarrett Soho, PA-C  tamsulosin (FLOMAX) 0.4 MG CAPS capsule Take 1 capsule (0.4 mg total) by mouth 2 (two) times daily. 01/19/18   Callista Hoh, Jarrett Soho, PA-C    Family History Family History  Problem Relation Age of Onset  . Bell's palsy Mother   . Heart failure Mother   . Asthma Father   . Hypertension Father     Social History Social History   Tobacco Use  . Smoking status: Never Smoker  . Smokeless tobacco: Never Used  Substance Use Topics  . Alcohol use: Yes    Comment: holidays  . Drug use: No      Allergies   Bee venom; Contrast media [iodinated diagnostic agents]; Eggs or egg-derived products; Penicillins; Latex; and Oxycodone-acetaminophen   Review of Systems Review of Systems  Constitutional: Negative for appetite change, diaphoresis, fatigue, fever and unexpected weight change.  HENT: Negative for mouth sores.   Eyes: Negative for visual disturbance.  Respiratory: Negative for cough, chest tightness, shortness of breath and wheezing.   Cardiovascular: Negative for chest pain.  Gastrointestinal: Negative for abdominal pain, constipation, diarrhea, nausea and vomiting.  Endocrine: Negative for polydipsia, polyphagia and polyuria.  Genitourinary: Positive for flank pain, frequency and urgency. Negative for dysuria and hematuria.  Musculoskeletal: Negative for back pain and neck stiffness.  Skin: Negative for rash.  Allergic/Immunologic: Negative for immunocompromised state.  Neurological: Negative for syncope, light-headedness and headaches.  Hematological: Does not bruise/bleed easily.  Psychiatric/Behavioral: Negative for sleep disturbance. The patient is not nervous/anxious.      Physical Exam Updated Vital Signs BP (!) 122/50 (BP Location: Left Arm)   Pulse  73   Temp 98.2 F (36.8 C) (Oral)   Resp 15   Ht 5\' 6"  (1.676 m)   Wt (!) 140.6 kg (310 lb)   SpO2 100%   BMI 50.04 kg/m   Physical Exam  Constitutional: She appears well-developed and well-nourished. No distress.  Awake, alert, nontoxic appearance  HENT:  Head: Normocephalic and atraumatic.  Mouth/Throat: Oropharynx is clear and moist. No oropharyngeal exudate.  Eyes: Conjunctivae are normal. No scleral icterus.  Neck: Normal range of motion. Neck supple.  Cardiovascular: Normal rate, regular rhythm, normal heart sounds and intact distal pulses.  Pulmonary/Chest: Effort normal and breath sounds normal. No respiratory distress. She has no wheezes.  Equal chest expansion  Abdominal: Soft. Bowel  sounds are normal. She exhibits no distension and no mass. There is tenderness (minimal, LLQ). There is CVA tenderness (mild, left). There is no rebound and no guarding.  Musculoskeletal: Normal range of motion. She exhibits no edema.  Neurological: She is alert.  Speech is clear and goal oriented Moves extremities without ataxia  Skin: Skin is warm and dry. No rash noted. She is not diaphoretic.  Psychiatric: She has a normal mood and affect.  Nursing note and vitals reviewed.    ED Treatments / Results  Labs (all labs ordered are listed, but only abnormal results are displayed) Labs Reviewed  URINALYSIS, ROUTINE W REFLEX MICROSCOPIC - Abnormal; Notable for the following components:      Result Value   APPearance HAZY (*)    Specific Gravity, Urine 1.031 (*)    Hgb urine dipstick LARGE (*)    Protein, ur 100 (*)    Squamous Epithelial / LPF 0-5 (*)    All other components within normal limits  BASIC METABOLIC PANEL - Abnormal; Notable for the following components:   Glucose, Bld 108 (*)    All other components within normal limits  CBC  URINALYSIS, MICROSCOPIC (REFLEX)  POC URINE PREG, ED     Radiology Ct Renal Stone Study  Result Date: 01/19/2018 CLINICAL DATA:  Left flank pain for several days, worse today. History of kidney stones. EXAM: CT ABDOMEN AND PELVIS WITHOUT CONTRAST TECHNIQUE: Multidetector CT imaging of the abdomen and pelvis was performed following the standard protocol without IV contrast. COMPARISON:  04/08/2017 FINDINGS: Lower chest: Lung bases are clear. Hepatobiliary: No focal liver abnormality is seen. Status post cholecystectomy. No biliary dilatation. Pancreas: Unremarkable. No pancreatic ductal dilatation or surrounding inflammatory changes. Spleen: Normal in size without focal abnormality. Adrenals/Urinary Tract: No adrenal gland nodules. Left kidney demonstrates a 8 mm stone in the lower pole and a 9 mm stone in the renal pelvis. There is no hydronephrosis  or hydroureter. No ureteral stones. Parenchymal cysts in the right kidney. No stone or obstruction demonstrated in the right kidney or ureter. Bladder is unremarkable. Stomach/Bowel: Stomach is within normal limits. Appendix appears normal. No evidence of bowel wall thickening, distention, or inflammatory changes. Vascular/Lymphatic: No significant vascular findings are present. No enlarged abdominal or pelvic lymph nodes. Reproductive: Uterus and bilateral adnexa are unremarkable. Other: No abdominal wall hernia or abnormality. No abdominopelvic ascites. Musculoskeletal: No acute or significant osseous findings. IMPRESSION: 1. Nonobstructing stone in the lower pole left kidney. Nonobstructing stone in the left renal pelvis. No hydronephrosis or hydroureter. 2. No evidence of bowel obstruction or inflammation. Electronically Signed   By: Lucienne Capers M.D.   On: 01/19/2018 00:34    Procedures Procedures (including critical care time)  Medications Ordered in ED Medications  morphine 4  MG/ML injection 4 mg (4 mg Intravenous Given 01/19/18 0059)  sodium chloride 0.9 % bolus 250 mL (0 mLs Intravenous Stopped 01/19/18 0236)  ondansetron (ZOFRAN) injection 4 mg (4 mg Intravenous Given 01/19/18 0059)  ketorolac (TORADOL) 30 MG/ML injection 30 mg (30 mg Intravenous Given 01/19/18 0243)  morphine (MSIR) tablet 15 mg (15 mg Oral Given 01/19/18 0243)     Initial Impression / Assessment and Plan / ED Course  I have reviewed the triage vital signs and the nursing notes.  Pertinent labs & imaging results that were available during my care of the patient were reviewed by me and considered in my medical decision making (see chart for details).  Clinical Course as of Jan 20 312  Sat Jan 19, 2018  0008 Pt is followed by [facility] for her brain tumor but it has been a bit more than a year since she was evaluated.    [HM]  0308 Narcotic database access.  Patient does not regularly receive narcotic pain control.  Her last  prescription was 07/09/2017.  [HM]  0311 Pain is well controlled.  No emesis here in the department.  [HM]    Clinical Course User Index [HM] Jull Harral, Gwenlyn Perking    Presents with left flank pain and known nephrolithiasis.  Labs are reassuring.  Patient does have protein in her urine but no evidence of urinary tract infection.  No leukocytosis.  Creatinine is within normal limits.  CT scan shows nonobstructing stone in the lower pole left kidney and nonobstructing stone in the left renal pelvis without hydronephrosis or hydroureter.  I have personally reviewed the images.  Patient is without fever, persistent pain or intractable vomiting.  Will give medications for symptomatic control.  Patient is to follow with her primary care and urologist.  Discussed reasons to return to the emergency department.  Patient states understanding and is agreement with this plan.    Final Clinical Impressions(s) / ED Diagnoses   Final diagnoses:  Ureteral colic  Left flank pain    ED Discharge Orders        Ordered    HYDROcodone-acetaminophen (NORCO/VICODIN) 5-325 MG tablet  Every 4 hours PRN     01/19/18 0309    Diclofenac Sodium CR (VOLTAREN-XR) 100 MG 24 hr tablet  Daily     01/19/18 0309    tamsulosin (FLOMAX) 0.4 MG CAPS capsule  2 times daily     01/19/18 0309    ondansetron (ZOFRAN ODT) 8 MG disintegrating tablet     01/19/18 0310       Axyl Sitzman, Jarrett Soho, PA-C 01/19/18 6568    Duffy Bruce, MD 01/19/18 1137

## 2018-01-19 NOTE — ED Notes (Signed)
Patient transported to CT 

## 2018-01-19 NOTE — Discharge Instructions (Signed)
1. Medications: zofran, vicodin, flomax, voltaren, usual home medications 2. Treatment: rest, drink plenty of fluids, advance diet slowly 3. Follow Up: Please followup with your primary doctor in 2 days for discussion of your diagnoses and further evaluation after today's visit; if you do not have a primary care doctor use the resource guide provided to find one; Please return to the ER for persistent vomiting, high fevers or worsening symptoms

## 2018-02-25 ENCOUNTER — Other Ambulatory Visit: Payer: Self-pay

## 2018-02-25 ENCOUNTER — Emergency Department: Payer: Medicaid Other

## 2018-02-25 ENCOUNTER — Emergency Department
Admission: EM | Admit: 2018-02-25 | Discharge: 2018-02-25 | Disposition: A | Discharge status: Home / Self Care | Payer: Medicaid Other | Attending: Emergency Medicine | Admitting: Emergency Medicine

## 2018-02-25 ENCOUNTER — Encounter: Payer: Self-pay | Admitting: Emergency Medicine

## 2018-02-25 DIAGNOSIS — Z86718 Personal history of other venous thrombosis and embolism: Secondary | ICD-10-CM | POA: Diagnosis not present

## 2018-02-25 DIAGNOSIS — Z91040 Latex allergy status: Secondary | ICD-10-CM | POA: Insufficient documentation

## 2018-02-25 DIAGNOSIS — N201 Calculus of ureter: Secondary | ICD-10-CM

## 2018-02-25 DIAGNOSIS — I10 Essential (primary) hypertension: Secondary | ICD-10-CM | POA: Diagnosis not present

## 2018-02-25 DIAGNOSIS — N132 Hydronephrosis with renal and ureteral calculous obstruction: Secondary | ICD-10-CM | POA: Diagnosis not present

## 2018-02-25 DIAGNOSIS — R1084 Generalized abdominal pain: Secondary | ICD-10-CM | POA: Diagnosis present

## 2018-02-25 DIAGNOSIS — R109 Unspecified abdominal pain: Secondary | ICD-10-CM

## 2018-02-25 DIAGNOSIS — Z79899 Other long term (current) drug therapy: Secondary | ICD-10-CM | POA: Insufficient documentation

## 2018-02-25 DIAGNOSIS — J45909 Unspecified asthma, uncomplicated: Secondary | ICD-10-CM | POA: Insufficient documentation

## 2018-02-25 LAB — COMPREHENSIVE METABOLIC PANEL
ALK PHOS: 70 U/L (ref 38–126)
ALT: 11 U/L — ABNORMAL LOW (ref 14–54)
ANION GAP: 11 (ref 5–15)
AST: 22 U/L (ref 15–41)
Albumin: 3.9 g/dL (ref 3.5–5.0)
BILIRUBIN TOTAL: 0.8 mg/dL (ref 0.3–1.2)
BUN: 12 mg/dL (ref 6–20)
CO2: 23 mmol/L (ref 22–32)
Calcium: 8.8 mg/dL — ABNORMAL LOW (ref 8.9–10.3)
Chloride: 101 mmol/L (ref 101–111)
Creatinine, Ser: 0.94 mg/dL (ref 0.44–1.00)
GFR calc non Af Amer: >60 mL/min (ref >60)
Glucose, Bld: 111 mg/dL — ABNORMAL HIGH (ref 65–99)
Potassium: 4.1 mmol/L (ref 3.5–5.1)
Sodium: 135 mmol/L (ref 135–145)
TOTAL PROTEIN: 8 g/dL (ref 6.5–8.1)

## 2018-02-25 LAB — URINALYSIS, ROUTINE W REFLEX MICROSCOPIC
BILIRUBIN URINE: NEGATIVE
GLUCOSE, UA: NEGATIVE mg/dL
Ketones, ur: 5 mg/dL — AB
LEUKOCYTES UA: NEGATIVE
NITRITE: NEGATIVE
PH: 7 (ref 5.0–8.0)
Protein, ur: NEGATIVE mg/dL
SPECIFIC GRAVITY, URINE: 1.013 (ref 1.005–1.030)

## 2018-02-25 LAB — URINE DRUG SCREEN, QUALITATIVE (ARMC ONLY)
Amphetamines, Ur Screen: NOT DETECTED
Barbiturates, Ur Screen: NOT DETECTED
Benzodiazepine, Ur Scrn: NOT DETECTED
CANNABINOID 50 NG, UR ~~LOC~~: NOT DETECTED
COCAINE METABOLITE, UR ~~LOC~~: NOT DETECTED
MDMA (Ecstasy)Ur Screen: NOT DETECTED
METHADONE SCREEN, URINE: NOT DETECTED
OPIATE, UR SCREEN: NOT DETECTED
PHENCYCLIDINE (PCP) UR S: NOT DETECTED
Tricyclic, Ur Screen: NOT DETECTED

## 2018-02-25 LAB — POCT PREGNANCY, URINE: Preg Test, Ur: NEGATIVE

## 2018-02-25 MED ORDER — FENTANYL CITRATE (PF) 100 MCG/2ML IJ SOLN
50.0000 ug | Freq: Once | INTRAMUSCULAR | Status: AC
  Administered 2018-02-25: 50 ug via INTRAVENOUS

## 2018-02-25 MED ORDER — KETOROLAC TROMETHAMINE 30 MG/ML IJ SOLN
15.0000 mg | Freq: Once | INTRAMUSCULAR | Status: AC
  Administered 2018-02-25: 15 mg via INTRAVENOUS
  Filled 2018-02-25: qty 1

## 2018-02-25 MED ORDER — MORPHINE SULFATE (PF) 4 MG/ML IV SOLN
INTRAVENOUS | Status: AC
  Filled 2018-02-25: qty 1

## 2018-02-25 MED ORDER — TAMSULOSIN HCL 0.4 MG PO CAPS: 0.4000 mg | ORAL_CAPSULE | Freq: Every day | ORAL | 0 refills | Status: DC

## 2018-02-25 MED ORDER — MORPHINE SULFATE (PF) 4 MG/ML IV SOLN
4.0000 mg | Freq: Once | INTRAVENOUS | Status: AC
  Administered 2018-02-25: 4 mg via INTRAVENOUS

## 2018-02-25 MED ORDER — FENTANYL CITRATE (PF) 100 MCG/2ML IJ SOLN
INTRAMUSCULAR | Status: AC
  Administered 2018-02-25: 50 ug via INTRAVENOUS
  Filled 2018-02-25: qty 2

## 2018-02-25 MED ORDER — SODIUM CHLORIDE 0.9 % IV BOLUS (SEPSIS)
1000.0000 mL | Freq: Once | INTRAVENOUS | Status: AC
  Administered 2018-02-25: 1000 mL via INTRAVENOUS

## 2018-02-25 MED ORDER — HYDROCODONE-ACETAMINOPHEN 5-325 MG PO TABS: 1.0000 | ORAL_TABLET | ORAL | 0 refills | Status: DC | PRN

## 2018-02-25 MED ORDER — ONDANSETRON HCL 4 MG PO TABS: 4.0000 mg | ORAL_TABLET | Freq: Every day | ORAL | 0 refills | Status: DC | PRN

## 2018-02-25 MED ORDER — FENTANYL CITRATE (PF) 100 MCG/2ML IJ SOLN
INTRAMUSCULAR | Status: AC
Start: 2018-02-25 — End: 2018-02-25
  Administered 2018-02-25: 50 ug via INTRAVENOUS
  Filled 2018-02-25: qty 2

## 2018-02-25 MED ORDER — ONDANSETRON HCL 4 MG/2ML IJ SOLN
4.0000 mg | Freq: Once | INTRAMUSCULAR | Status: AC
  Administered 2018-02-25: 4 mg via INTRAVENOUS
  Filled 2018-02-25: qty 2

## 2018-02-25 MED ORDER — FENTANYL CITRATE (PF) 100 MCG/2ML IJ SOLN
50.0000 ug | INTRAMUSCULAR | Status: AC | PRN
  Administered 2018-02-25 (×2): 50 ug via INTRAVENOUS
  Filled 2018-02-25: qty 2

## 2018-02-25 NOTE — ED Triage Notes (Signed)
Presents vis ems with left flank pain  Hx of renal stones in past  Feels same  Pos n/v  Was given Zofran 4 mg PTA

## 2018-02-25 NOTE — ED Notes (Signed)
Pt c/o left sided flank pain since earlier today. Pt reports 7 episodes of emesis in the last 24 hours. Pt describes it as yellow bile. Pt denies any diarrhea.

## 2018-02-25 NOTE — ED Provider Notes (Addendum)
[facility] Emergency Department Provider Note  ____________________________________________   I have reviewed the triage vital signs and the nursing notes. Where available I have reviewed prior notes and, if possible and indicated, outside hospital notes.    HISTORY  Chief Complaint Flank Pain    HPI Carol Ortega is a 47 y.o. female with a history of anxiety, anemia, and multiple other medical problems including recurrent kidney stones presents today complaining of sudden onset left flank pain.  Patient was seen about a month and a half ago and was found to have kidney stones she had a little bit of pain every day but suddenly got worse today.  She has had vomiting, and severe discomfort.  No fever, no dysuria no urinary frequency, mild hematuria as noted.  Patient states this is similar to multiple different presentations for kidney stones in the past however this feels even worse.  She received fentanyl which "did not touch" her pain and she would like more pain medications a sharp nonradiating pain that seems to be present in the left side of her flank and also in the left lower quadrant.   Past Medical History:  Diagnosis Date  . Anemia   . Anxiety   . Asthma    seasonal  . Brain tumor ([place]) 2014   tx with radiation  . Cancer ([place])   . Complication of anesthesia 1999   lung collapse after surgery for gallbladder  . Depression   . Headache    migraines  . Hoarseness of voice   . Hypertension   . Kidney stone   . Obesity   . Parathyroid abnormality ([place])   . Pulmonary emboli ([place]) 2011  . Radiation    Brain tumor  . UTI (urinary tract infection)   . Vitamin D deficiency     Patient Active Problem List   Diagnosis Date Noted  . Kidney stone 04/09/2017  . Hyperparathyroidism ([place]) 09/10/2015  . Vitamin D deficiency 06/25/2014  . Hypercalcemia 02/09/2014  . 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 CHOLECYSTECTOMY  1999  . CYSTOSCOPY W/ URETERAL STENT PLACEMENT Right 04/08/2017   Procedure: CYSTOSCOPY WITH RETROGRADE PYELOGRAM/URETERAL STENT PLACEMENT;  Surgeon: Rana Snare, MD;  Location: [facility];  Service: Urology;  Laterality: Right;  . CYSTOSCOPY W/ URETEROSCOPY    . CYSTOSCOPY WITH RETROGRADE PYELOGRAM, URETEROSCOPY AND STENT PLACEMENT Left 01/20/2014   Procedure: LEFT URETEROSCOPY WITH STENT PLACEMENT;  Surgeon: Irine Seal, MD;  Location: [facility];  Service: Urology;  Laterality: Left;  . CYSTOSCOPY WITH RETROGRADE PYELOGRAM, URETEROSCOPY AND STENT PLACEMENT Bilateral 04/23/2015   Procedure: CYSTOSCOPY WITH BILATERAL RETROGRADE PYELOGRAM, URETEROSCOPY AND STENT PLACEMENT;  Surgeon: Alexis Frock, MD;  Location: [facility];  Service: Urology;  Laterality: Bilateral;  . CYSTOSCOPY WITH RETROGRADE PYELOGRAM, URETEROSCOPY AND STENT PLACEMENT Right 04/13/2017   Procedure: CYSTOSCOPY WITH RETROGRADE PYELOGRAM, URETEROSCOPY AND STENT EXCHANGE;  Surgeon: Alexis Frock, MD;  Location: [facility];  Service: Urology;  Laterality: Right;  . CYSTOSCOPY WITH STENT PLACEMENT Left 01/12/2014   Procedure: CYSTOSCOPY WITH STENT PLACEMENT left retrograde;  Surgeon: Irine Seal, MD;  Location: [facility];  Service: Urology;  Laterality: Left;  . HOLMIUM LASER APPLICATION Left 07/23/7618   Procedure: HOLMIUM LASER APPLICATION;  Surgeon: Irine Seal, MD;  Location: [facility];  Service: Urology;  Laterality: Left;  . HOLMIUM LASER APPLICATION Bilateral 5/0/9326   Procedure: HOLMIUM LASER APPLICATION;  Surgeon: Alexis Frock, MD;  Location: [facility];  Service: Urology;  Laterality: Bilateral;  . HOLMIUM LASER APPLICATION Right 4/33/2951   Procedure: HOLMIUM LASER APPLICATION;  Surgeon: Alexis Frock, MD;  Location: [facility];  Service: Urology;  Laterality: Right;  . kidney stone removal    . LITHOTRIPSY    . PARATHYROIDECTOMY Right 11/13/2016   Procedure: RIGHT SUPERIOR PARATHYROIDECTOMY;  Surgeon: Armandina Gemma, MD;   Location: [place];  Service: General;  Laterality: Right;  . surgery for,ectopic pregnancy  2011    1 fallopian tube rupture  . TUBAL LIGATION  2011    Prior to Admission medications   Medication Sig Start Date End Date Taking? Authorizing Provider  acetaminophen (TYLENOL) 500 MG tablet Take 1,000 mg by mouth every 6 (six) hours as needed for mild pain, moderate pain, fever or headache.     [provider]  albuterol (PROVENTIL HFA;VENTOLIN HFA) 108 (90 Base) MCG/ACT inhaler Inhale 2 puffs into the lungs every 6 (six) hours as needed for wheezing or shortness of breath.    [provider]  albuterol (PROVENTIL) (2.5 MG/3ML) 0.083% nebulizer solution Inhale 2.5 mg into the lungs every 6 (six) hours as needed for wheezing or shortness of breath.     [provider]  Diclofenac Sodium CR (VOLTAREN-XR) 100 MG 24 hr tablet Take 1 tablet (100 mg total) by mouth daily. 01/19/18   Muthersbaugh, Jarrett Soho, PA-C  HYDROcodone-acetaminophen (NORCO/VICODIN) 5-325 MG tablet Take 1 tablet by mouth every 4 (four) hours as needed. 01/19/18   Muthersbaugh, Jarrett Soho, PA-C  lisinopril-hydrochlorothiazide (PRINZIDE,ZESTORETIC) 20-12.5 MG per tablet Take 1 tablet by mouth daily.     [provider]  ondansetron (ZOFRAN ODT) 8 MG disintegrating tablet 8mg  ODT q4 hours prn nausea 01/19/18   Muthersbaugh, Jarrett Soho, PA-C  pantoprazole (PROTONIX) 40 MG tablet Take 40 mg by mouth daily.     [provider]  tamsulosin (FLOMAX) 0.4 MG CAPS capsule Take 1 capsule (0.4 mg total) by mouth 2 (two) times daily. 01/19/18   Muthersbaugh, Jarrett Soho, PA-C    Allergies Bee venom; Contrast media [iodinated diagnostic agents]; Eggs or egg-derived products; Penicillins; Latex; and Oxycodone-acetaminophen  Family History  Problem Relation Age of Onset  . Bell's palsy Mother   . Heart failure Mother   . Asthma Father   . Hypertension Father     Social History Social History   Tobacco Use  . Smoking  status: Never Smoker  . Smokeless tobacco: Never Used  Substance Use Topics  . Alcohol use: Yes    Comment: holidays  . Drug use: No    Review of Systems Constitutional: No fever/chills Eyes: No visual changes. ENT: No sore throat. No stiff neck no neck pain Cardiovascular: Denies chest pain. Respiratory: Denies shortness of breath. Gastrointestinal:   + vomiting.  No diarrhea.  No constipation. Genitourinary: Negative for dysuria. Musculoskeletal: Negative lower extremity swelling Skin: Negative for rash. Neurological: Negative for severe headaches, focal weakness or numbness.   ____________________________________________   PHYSICAL EXAM:  VITAL SIGNS: ED Triage Vitals [02/25/18 1755]  Enc Vitals Group     BP (!) 123/98     Pulse Rate 86     Resp (!) 22     Temp 97.6 F (36.4 C)     Temp Source Oral     SpO2 99 %     Weight (!) 310 lb (140.6 kg)     Height 5\' 6"  (1.676 m)     Head Circumference      Peak Flow      Pain  Score 9     Pain Loc      Pain Edu?      Excl. in [place]?     Constitutional: Alert and oriented.  He is uncomfortable but nontoxic Eyes: Conjunctivae are normal Head: Atraumatic HEENT: No congestion/rhinnorhea. Mucous membranes are moist.  Oropharynx non-erythematous Neck:   Nontender with no meningismus, no masses, no stridor Cardiovascular: Normal rate, regular rhythm. Grossly normal heart sounds.  Good peripheral circulation. Respiratory: Normal respiratory effort.  No retractions. Lungs CTAB. Abdominal: Soft and positive left-sided abdominal pain noted no guarding or rebound. No distention. No guarding no rebound Back:  There is no focal tenderness or step off.  there is no midline tenderness there are no lesions noted. there is no CVA tenderness Musculoskeletal: No lower extremity tenderness, no upper extremity tenderness. No joint effusions, positive DVT signs strong distal pulses no edema Neurologic:  Normal speech and language. No gross  focal neurologic deficits are appreciated.  Skin:  Skin is warm, dry and intact. No rash noted. Psychiatric: Mood and affect are normal. Speech and behavior are normal.  ____________________________________________   LABS (all labs ordered are listed, but only abnormal results are displayed)  Labs Reviewed  COMPREHENSIVE METABOLIC PANEL - Abnormal; Notable for the following components:      Result Value   Glucose, Bld 111 (*)    Calcium 8.8 (*)    ALT 11 (*)    All other components within normal limits  URINALYSIS, ROUTINE W REFLEX MICROSCOPIC - Abnormal; Notable for the following components:   Color, Urine YELLOW (*)    APPearance CLEAR (*)    Hgb urine dipstick SMALL (*)    Ketones, ur 5 (*)    Bacteria, UA FEW (*)    Squamous Epithelial / LPF 0-5 (*)    All other components within normal limits  URINE DRUG SCREEN, QUALITATIVE ([facility] ONLY)  POC URINE PREG, ED  POCT PREGNANCY, URINE    Pertinent labs  results that were available during my care of the patient were reviewed by me and considered in my medical decision making (see chart for details). ____________________________________________  EKG  I personally interpreted any EKGs ordered by me or triage  ____________________________________________  RADIOLOGY  Pertinent labs & imaging results that were available during my care of the patient were reviewed by me and considered in my medical decision making (see chart for details). If possible, patient and/or family made aware of any abnormal findings.  Ct Renal Stone Study  Result Date: 02/25/2018 CLINICAL DATA:  Initial evaluation for acute left flank pain. History of kidney stones. EXAM: CT ABDOMEN AND PELVIS WITHOUT CONTRAST TECHNIQUE: Multidetector CT imaging of the abdomen and pelvis was performed following the standard protocol without IV contrast. FINDINGS: Lower chest: Mild scattered atelectatic changes present within the visualized lung bases. Visualized lungs are  otherwise clear. Hepatobiliary: Liver demonstrates a normal unenhanced appearance. Gallbladder surgically absent. No biliary dilatation. Pancreas: Pancreas within normal limits. Spleen: Spleen within normal limits. Adrenals/Urinary Tract: Adrenal glands are normal. There is an obstructive 9 mm stone at the left UPJ with secondary moderate left hydronephrosis. Associated left perinephric fat stranding. Additional nonobstructive 11 mm stone within the inferior pole left kidney. No other radiopaque calculi seen distally within the left ureter. No right-sided nephrolithiasis or ureterolithiasis. Few scattered approximate 2 cm cyst present at the lower pole the right kidney. Bladder largely decompressed without acute abnormality. No layering stones within the bladder lumen. Stomach/Bowel: Stomach within normal limits. No evidence for  bowel obstruction. Appendix is normal. No acute inflammatory changes seen about the bowels. Few scattered colonic diverticula noted. Vascular/Lymphatic: Mild aortic atherosclerosis. No aneurysm. No adenopathy. Reproductive: Uterus and ovaries within normal limits. Other: No free air or fluid. Musculoskeletal: No acute osseous abnormality. No worrisome lytic or blastic osseous lesions. IMPRESSION: 1. 9 mm obstructive stone at the left UPJ with secondary moderate left hydronephrosis. 2. Additional 11 mm nonobstructive left renal calculus. 3. No other acute intra-abdominal or pelvic process. Electronically Signed   By: Jeannine Boga M.D.   On: 02/25/2018 20:33   ____________________________________________    PROCEDURES  Procedure(s) performed: None  Procedures  Critical Care performed: None  ____________________________________________   INITIAL IMPRESSION / ASSESSMENT AND PLAN / ED COURSE  Pertinent labs & imaging results that were available during my care of the patient were reviewed by me and considered in my medical decision making (see chart for  details).  Patient here with left-sided abdominal pain and flank pain in the context of known kidney stones.  Given her severe pain I did do a CT scan to determine the size and position of the stones precisely as I suspected possible intervention might be needed.  We gave her 15 of Toradol which helped her pain a great deal though she still uncomfortable, and we have given her IV fluids.  Blood work is reassuring, urine is reassuring there is no evidence of infection, her creatinine is maintained, however, she has a 9 mm stone that is in the ureter and causing hydronephrosis.  D/w dr. Dayna Ramus of urology, who agrees with management, he states at this point there is no obstacle to giving more Toradol as needed.  If we get her pain under control, she can go home otherwise he will admit her for a stent.  ----------------------------------------- 9:32 PM on 02/25/2018 ----------------------------------------- Patient feels much better after morphine, vital signs are reassuring, I did offer her admission to the hospital for possible stent placement and she is adamant that she would prefer to go home.  She is not driving and knows she must not drive after the medication she is gotten here.  She is not allergic to Hydrocodone ercocet and has had it before.  She would like to go home with hydrocodone.  He was sent home with Percocet and Zofran, she is hungry will make sure that she can eat prior to discharge.  She is in no acute distress her pain is much better controlled, she will follow-up closely with urology in the next few days and she understands at any time she feels worse she is to come back and she has been given extensive return precautions and follow-up instructions     ____________________________________________   FINAL CLINICAL IMPRESSION(S) / ED DIAGNOSES  Final diagnoses:  None      This chart was dictated using voice recognition software.  Despite best efforts to proofread,  errors can  occur which can change meaning.      Schuyler Amor, MD 02/25/18 2106    Schuyler Amor, MD 02/25/18 2137    Schuyler Amor, MD 02/25/18 2140

## 2018-02-25 NOTE — Discharge Instructions (Signed)
You have a 9 mm kidney stone this likely will not pass on its own you do need to follow absolutely with urology if you have a fever, increased pain, persistent vomiting or you feel worse in any way please return to the emergency room.  Do not drive today or while taking Percocet.  Avoid ibuprofen and Motrin in case you need lithotripsy on Thursday.

## 2018-02-28 ENCOUNTER — Emergency Department: Payer: Medicaid Other

## 2018-02-28 ENCOUNTER — Other Ambulatory Visit: Payer: Self-pay

## 2018-02-28 ENCOUNTER — Inpatient Hospital Stay
Admission: EM | Admit: 2018-02-28 | Discharge: 2018-03-04 | DRG: 659 | Disposition: A | Discharge status: Home / Self Care | Payer: Medicaid Other | Attending: Internal Medicine | Admitting: Internal Medicine

## 2018-02-28 ENCOUNTER — Encounter: Payer: Self-pay | Admitting: Emergency Medicine

## 2018-02-28 DIAGNOSIS — R109 Unspecified abdominal pain: Secondary | ICD-10-CM | POA: Diagnosis present

## 2018-02-28 DIAGNOSIS — N201 Calculus of ureter: Secondary | ICD-10-CM

## 2018-02-28 DIAGNOSIS — R0602 Shortness of breath: Secondary | ICD-10-CM | POA: Diagnosis not present

## 2018-02-28 DIAGNOSIS — Z880 Allergy status to penicillin: Secondary | ICD-10-CM

## 2018-02-28 DIAGNOSIS — Z79899 Other long term (current) drug therapy: Secondary | ICD-10-CM | POA: Diagnosis not present

## 2018-02-28 DIAGNOSIS — I10 Essential (primary) hypertension: Secondary | ICD-10-CM | POA: Diagnosis present

## 2018-02-28 DIAGNOSIS — Z8249 Family history of ischemic heart disease and other diseases of the circulatory system: Secondary | ICD-10-CM | POA: Diagnosis not present

## 2018-02-28 DIAGNOSIS — Z6841 Body Mass Index (BMI) 40.0 and over, adult: Secondary | ICD-10-CM | POA: Diagnosis not present

## 2018-02-28 DIAGNOSIS — N132 Hydronephrosis with renal and ureteral calculous obstruction: Secondary | ICD-10-CM | POA: Diagnosis not present

## 2018-02-28 DIAGNOSIS — R1032 Left lower quadrant pain: Secondary | ICD-10-CM | POA: Diagnosis not present

## 2018-02-28 DIAGNOSIS — J45901 Unspecified asthma with (acute) exacerbation: Secondary | ICD-10-CM | POA: Diagnosis not present

## 2018-02-28 DIAGNOSIS — K219 Gastro-esophageal reflux disease without esophagitis: Secondary | ICD-10-CM | POA: Diagnosis present

## 2018-02-28 DIAGNOSIS — Z87442 Personal history of urinary calculi: Secondary | ICD-10-CM

## 2018-02-28 DIAGNOSIS — Z885 Allergy status to narcotic agent status: Secondary | ICD-10-CM

## 2018-02-28 DIAGNOSIS — Z9049 Acquired absence of other specified parts of digestive tract: Secondary | ICD-10-CM | POA: Diagnosis not present

## 2018-02-28 DIAGNOSIS — Z91041 Radiographic dye allergy status: Secondary | ICD-10-CM | POA: Diagnosis not present

## 2018-02-28 DIAGNOSIS — Z91012 Allergy to eggs: Secondary | ICD-10-CM

## 2018-02-28 DIAGNOSIS — Z86711 Personal history of pulmonary embolism: Secondary | ICD-10-CM | POA: Diagnosis not present

## 2018-02-28 DIAGNOSIS — Z91040 Latex allergy status: Secondary | ICD-10-CM | POA: Diagnosis not present

## 2018-02-28 DIAGNOSIS — J690 Pneumonitis due to inhalation of food and vomit: Secondary | ICD-10-CM | POA: Diagnosis not present

## 2018-02-28 DIAGNOSIS — Z825 Family history of asthma and other chronic lower respiratory diseases: Secondary | ICD-10-CM

## 2018-02-28 DIAGNOSIS — J9601 Acute respiratory failure with hypoxia: Secondary | ICD-10-CM | POA: Diagnosis not present

## 2018-02-28 DIAGNOSIS — Z91030 Bee allergy status: Secondary | ICD-10-CM | POA: Diagnosis not present

## 2018-02-28 DIAGNOSIS — Z87440 Personal history of urinary (tract) infections: Secondary | ICD-10-CM

## 2018-02-28 LAB — CBC WITH DIFFERENTIAL/PLATELET
Basophils Absolute: 0.1 10*3/uL (ref 0–0.1)
Basophils Relative: 1 %
Eosinophils Absolute: 0.2 10*3/uL (ref 0–0.7)
Eosinophils Relative: 3 %
HEMATOCRIT: 40.3 % (ref 35.0–47.0)
Hemoglobin: 12.8 g/dL (ref 12.0–16.0)
LYMPHS PCT: 18 %
Lymphs Abs: 1.2 10*3/uL (ref 1.0–3.6)
MCH: 26.6 pg (ref 26.0–34.0)
MCHC: 31.7 g/dL — AB (ref 32.0–36.0)
MCV: 83.9 fL (ref 80.0–100.0)
MONO ABS: 0.7 10*3/uL (ref 0.2–0.9)
MONOS PCT: 12 %
NEUTROS ABS: 4.3 10*3/uL (ref 1.4–6.5)
Neutrophils Relative %: 66 %
Platelets: 272 10*3/uL (ref 150–440)
RBC: 4.8 MIL/uL (ref 3.80–5.20)
RDW: 15.6 % — AB (ref 11.5–14.5)
WBC: 6.4 10*3/uL (ref 3.6–11.0)

## 2018-02-28 LAB — BASIC METABOLIC PANEL
Anion gap: 9 (ref 5–15)
BUN: 8 mg/dL (ref 6–20)
CALCIUM: 8.9 mg/dL (ref 8.9–10.3)
CO2: 26 mmol/L (ref 22–32)
CREATININE: 0.89 mg/dL (ref 0.44–1.00)
Chloride: 105 mmol/L (ref 101–111)
GFR calc Af Amer: >60 mL/min (ref >60)
GFR calc non Af Amer: >60 mL/min (ref >60)
GLUCOSE: 118 mg/dL — AB (ref 65–99)
Potassium: 3.7 mmol/L (ref 3.5–5.1)
Sodium: 140 mmol/L (ref 135–145)

## 2018-02-28 LAB — URINALYSIS, COMPLETE (UACMP) WITH MICROSCOPIC
BILIRUBIN URINE: NEGATIVE
Glucose, UA: NEGATIVE mg/dL
Ketones, ur: NEGATIVE mg/dL
Nitrite: NEGATIVE
PROTEIN: NEGATIVE mg/dL
SPECIFIC GRAVITY, URINE: 1.012 (ref 1.005–1.030)
pH: 7 (ref 5.0–8.0)

## 2018-02-28 MED ORDER — SODIUM CHLORIDE 0.9 % IV SOLN
1.0000 g | INTRAVENOUS | Status: DC
  Filled 2018-02-28: qty 10

## 2018-02-28 MED ORDER — LIDOCAINE HCL (CARDIAC) 20 MG/ML IV SOLN
0.7500 mg/kg | Freq: Once | INTRAVENOUS | Status: DC
  Filled 2018-02-28: qty 10

## 2018-02-28 MED ORDER — ACETAMINOPHEN 650 MG RE SUPP: 650.0000 mg | Freq: Four times a day (QID) | RECTAL | Status: DC | PRN

## 2018-02-28 MED ORDER — LACTATED RINGERS IV SOLN
INTRAVENOUS | Status: DC
  Administered 2018-02-28 – 2018-03-01 (×4): via INTRAVENOUS

## 2018-02-28 MED ORDER — HYDRALAZINE HCL 20 MG/ML IJ SOLN: 10.0000 mg | INTRAMUSCULAR | Status: DC | PRN

## 2018-02-28 MED ORDER — HYDROCODONE-ACETAMINOPHEN 5-325 MG PO TABS
1.0000 | ORAL_TABLET | Freq: Once | ORAL | Status: AC
  Administered 2018-02-28: 1 via ORAL
  Filled 2018-02-28: qty 1

## 2018-02-28 MED ORDER — HYDROCODONE-ACETAMINOPHEN 5-325 MG PO TABS
1.0000 | ORAL_TABLET | ORAL | Status: DC | PRN
  Administered 2018-03-01 – 2018-03-03 (×6): 1 via ORAL
  Filled 2018-02-28 (×7): qty 1

## 2018-02-28 MED ORDER — LISINOPRIL 20 MG PO TABS: 20.0000 mg | ORAL_TABLET | Freq: Every day | ORAL | Status: DC

## 2018-02-28 MED ORDER — TAMSULOSIN HCL 0.4 MG PO CAPS
0.4000 mg | ORAL_CAPSULE | Freq: Every day | ORAL | Status: DC
  Administered 2018-03-02 – 2018-03-04 (×3): 0.4 mg via ORAL
  Filled 2018-02-28 (×3): qty 1

## 2018-02-28 MED ORDER — HYDROCHLOROTHIAZIDE 12.5 MG PO CAPS
12.5000 mg | ORAL_CAPSULE | Freq: Every day | ORAL | Status: DC
Start: 2018-03-01 — End: 2018-03-02

## 2018-02-28 MED ORDER — POLYETHYLENE GLYCOL 3350 17 G PO PACK: 17.0000 g | PACK | Freq: Every day | ORAL | Status: DC | PRN

## 2018-02-28 MED ORDER — BISACODYL 5 MG PO TBEC: 5.0000 mg | DELAYED_RELEASE_TABLET | Freq: Every day | ORAL | Status: DC | PRN

## 2018-02-28 MED ORDER — SODIUM CHLORIDE 0.9 % IV BOLUS (SEPSIS)
1000.0000 mL | Freq: Once | INTRAVENOUS | Status: AC
  Administered 2018-02-28: 1000 mL via INTRAVENOUS

## 2018-02-28 MED ORDER — LIDOCAINE HCL (PF) 2 % IJ SOLN
Freq: Once | INTRAVENOUS | Status: AC
  Administered 2018-02-28: 22:00:00 via INTRAVENOUS
  Filled 2018-02-28: qty 5.27

## 2018-02-28 MED ORDER — PANTOPRAZOLE SODIUM 40 MG PO TBEC
40.0000 mg | DELAYED_RELEASE_TABLET | Freq: Every day | ORAL | Status: DC
  Administered 2018-03-02 – 2018-03-04 (×3): 40 mg via ORAL
  Filled 2018-02-28 (×3): qty 1

## 2018-02-28 MED ORDER — MORPHINE SULFATE (PF) 4 MG/ML IV SOLN
4.0000 mg | INTRAVENOUS | Status: DC | PRN
  Administered 2018-02-28 – 2018-03-04 (×9): 4 mg via INTRAVENOUS
  Filled 2018-02-28 (×9): qty 1

## 2018-02-28 MED ORDER — ACETAMINOPHEN 325 MG PO TABS: 650.0000 mg | ORAL_TABLET | Freq: Four times a day (QID) | ORAL | Status: DC | PRN

## 2018-02-28 MED ORDER — ONDANSETRON HCL 4 MG/2ML IJ SOLN
4.0000 mg | Freq: Four times a day (QID) | INTRAMUSCULAR | Status: DC | PRN
  Administered 2018-03-01: 4 mg via INTRAVENOUS

## 2018-02-28 MED ORDER — DOCUSATE SODIUM 100 MG PO CAPS
100.0000 mg | ORAL_CAPSULE | Freq: Two times a day (BID) | ORAL | Status: DC
  Administered 2018-03-01 – 2018-03-04 (×6): 100 mg via ORAL
  Filled 2018-02-28 (×6): qty 1

## 2018-02-28 MED ORDER — MORPHINE SULFATE (PF) 4 MG/ML IV SOLN
4.0000 mg | INTRAVENOUS | Status: DC | PRN
  Administered 2018-03-01 – 2018-03-03 (×2): 4 mg via INTRAVENOUS
  Filled 2018-02-28 (×2): qty 1

## 2018-02-28 MED ORDER — KETOROLAC TROMETHAMINE 30 MG/ML IJ SOLN
15.0000 mg | Freq: Once | INTRAMUSCULAR | Status: AC
  Administered 2018-02-28: 15 mg via INTRAVENOUS
  Filled 2018-02-28: qty 1

## 2018-02-28 MED ORDER — ONDANSETRON HCL 4 MG PO TABS: 4.0000 mg | ORAL_TABLET | Freq: Four times a day (QID) | ORAL | Status: DC | PRN

## 2018-02-28 MED ORDER — ALBUTEROL SULFATE (2.5 MG/3ML) 0.083% IN NEBU
2.5000 mg | INHALATION_SOLUTION | Freq: Four times a day (QID) | RESPIRATORY_TRACT | Status: DC | PRN
  Administered 2018-03-01 – 2018-03-02 (×2): 2.5 mg via RESPIRATORY_TRACT
  Filled 2018-02-28 (×2): qty 3

## 2018-02-28 MED ORDER — LISINOPRIL-HYDROCHLOROTHIAZIDE 20-12.5 MG PO TABS: 1.0000 | ORAL_TABLET | Freq: Every day | ORAL | Status: DC

## 2018-02-28 MED ORDER — HEPARIN SODIUM (PORCINE) 5000 UNIT/ML IJ SOLN
5000.0000 [IU] | Freq: Three times a day (TID) | INTRAMUSCULAR | Status: DC
  Administered 2018-03-01 – 2018-03-02 (×2): 5000 [IU] via SUBCUTANEOUS
  Filled 2018-02-28 (×2): qty 1

## 2018-02-28 MED ORDER — PROMETHAZINE HCL 25 MG/ML IJ SOLN
12.5000 mg | Freq: Four times a day (QID) | INTRAMUSCULAR | Status: DC | PRN
  Administered 2018-02-28: 12.5 mg via INTRAVENOUS
  Filled 2018-02-28: qty 1

## 2018-02-28 NOTE — ED Triage Notes (Signed)
Patient ambulatory to triage with steady gait, without difficulty or distress noted; pt reports left flank pain radiating into abd; seen Tues and dx with 37mm stone; st pain unrelieved by flomax and hydrocone; has appt with urologist on Monday but pain increasing

## 2018-02-28 NOTE — ED Provider Notes (Addendum)
[facility] Emergency Department Provider Note    None    (approximate)  I have reviewed the triage vital signs and the nursing notes.   HISTORY  Chief Complaint Flank pain   HPI Carol Ortega is a 47 y.o. female with recent visit to the ER with diagnosis of ureterolithiasis Re presents to the ER today with persistent left flank pain that was not controlled with home oral pain medication medication.  States that she is also having associated nausea and unable to keep anything down.  States the pain is moderate to severe and feels it has migrated somewhat down into the left lower quadrant.  States she is also having some dysuria but no measured fevers.  States she does have follow-up with urology on Monday.  Past Medical History:  Diagnosis Date  . Anemia   . Anxiety   . Asthma    seasonal  . Brain tumor ([place]) 2014   tx with radiation  . Cancer ([place])   . Complication of anesthesia 1999   lung collapse after surgery for gallbladder  . Depression   . Headache    migraines  . Hoarseness of voice   . Hypertension   . Kidney stone   . Obesity   . Parathyroid abnormality ([place])   . Pulmonary emboli ([place]) 2011  . Radiation    Brain tumor  . UTI (urinary tract infection)   . Vitamin D deficiency    Family History  Problem Relation Age of Onset  . Bell's palsy Mother   . Heart failure Mother   . Asthma Father   . Hypertension Father    Past Surgical History:  Procedure Laterality Date  . CHOLECYSTECTOMY  1999  . CYSTOSCOPY W/ URETERAL STENT PLACEMENT Right 04/08/2017   Procedure: CYSTOSCOPY WITH RETROGRADE PYELOGRAM/URETERAL STENT PLACEMENT;  Surgeon: Rana Snare, MD;  [facility];  Service: Urology;  Laterality: Right;  . CYSTOSCOPY W/ URETEROSCOPY    . CYSTOSCOPY WITH RETROGRADE PYELOGRAM, URETEROSCOPY AND STENT PLACEMENT Left 01/20/2014   Procedure: LEFT URETEROSCOPY WITH STENT PLACEMENT;  Surgeon: Irine Seal, MD;  [facility];   Service: Urology;  Laterality: Left;  . CYSTOSCOPY WITH RETROGRADE PYELOGRAM, URETEROSCOPY AND STENT PLACEMENT Bilateral 04/23/2015   Procedure: CYSTOSCOPY WITH BILATERAL RETROGRADE PYELOGRAM, URETEROSCOPY AND STENT PLACEMENT;  Surgeon: Alexis Frock, MD;  [facility];  Service: Urology;  Laterality: Bilateral;  . CYSTOSCOPY WITH RETROGRADE PYELOGRAM, URETEROSCOPY AND STENT PLACEMENT Right 04/13/2017   Procedure: CYSTOSCOPY WITH RETROGRADE PYELOGRAM, URETEROSCOPY AND STENT EXCHANGE;  Surgeon: Alexis Frock, MD;  [facility];  Service: Urology;  Laterality: Right;  . CYSTOSCOPY WITH STENT PLACEMENT Left 01/12/2014   Procedure: CYSTOSCOPY WITH STENT PLACEMENT left retrograde;  Surgeon: Irine Seal, MD;  [facility];  Service: Urology;  Laterality: Left;  . HOLMIUM LASER APPLICATION Left 05/19/8365   Procedure: HOLMIUM LASER APPLICATION;  Surgeon: Irine Seal, MD;  [facility];  Service: Urology;  Laterality: Left;  . HOLMIUM LASER APPLICATION Bilateral 01/27/4764   Procedure: HOLMIUM LASER APPLICATION;  Surgeon: Alexis Frock, MD;  [facility];  Service: Urology;  Laterality: Bilateral;  . HOLMIUM LASER APPLICATION Right 4/65/0354   Procedure: HOLMIUM LASER APPLICATION;  Surgeon: Alexis Frock, MD;  [facility];  Service: Urology;  Laterality: Right;  . kidney stone removal    . LITHOTRIPSY    . PARATHYROIDECTOMY Right 11/13/2016   Procedure: RIGHT SUPERIOR PARATHYROIDECTOMY;  Surgeon: Armandina Gemma, MD;  Location: [facility];  Service: General;  Laterality: Right;  . surgery for,ectopic pregnancy  2011    1 fallopian tube rupture  . TUBAL LIGATION  2011   Patient Active Problem List   Diagnosis Date Noted  . Kidney stone 04/09/2017  . Hyperparathyroidism ([place]) 09/10/2015  . Vitamin D deficiency 06/25/2014  . Hypercalcemia 02/09/2014  . 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 Authorizing  Provider  acetaminophen (TYLENOL) 500 MG tablet Take 1,000 mg by mouth every 6 (six) hours as needed for mild pain, moderate pain, fever or headache.     [provider]  albuterol (PROVENTIL HFA;VENTOLIN HFA) 108 (90 Base) MCG/ACT inhaler Inhale 2 puffs into the lungs every 6 (six) hours as needed for wheezing or shortness of breath.    [provider]  albuterol (PROVENTIL) (2.5 MG/3ML) 0.083% nebulizer solution Inhale 2.5 mg into the lungs every 6 (six) hours as needed for wheezing or shortness of breath.     [provider]  Diclofenac Sodium CR (VOLTAREN-XR) 100 MG 24 hr tablet Take 1 tablet (100 mg total) by mouth daily. 01/19/18   Muthersbaugh, Jarrett Soho, PA-C  HYDROcodone-acetaminophen (NORCO) 5-325 MG tablet Take 1 tablet by mouth every 4 (four) hours as needed for moderate pain. 02/25/18   Schuyler Amor, MD  HYDROcodone-acetaminophen (NORCO/VICODIN) 5-325 MG tablet Take 1 tablet by mouth every 4 (four) hours as needed. 01/19/18   Muthersbaugh, Jarrett Soho, PA-C  lisinopril-hydrochlorothiazide (PRINZIDE,ZESTORETIC) 20-12.5 MG per tablet Take 1 tablet by mouth daily.     [provider]  ondansetron (ZOFRAN ODT) 8 MG disintegrating tablet 8mg  ODT q4 hours prn nausea 01/19/18   Muthersbaugh, Jarrett Soho, PA-C  ondansetron (ZOFRAN) 4 MG tablet Take 1 tablet (4 mg total) by mouth daily as needed for nausea or vomiting. 02/25/18   Schuyler Amor, MD  pantoprazole (PROTONIX) 40 MG tablet Take 40 mg by mouth daily.     [provider]  tamsulosin (FLOMAX) 0.4 MG CAPS capsule Take 1 capsule (0.4 mg total) by mouth 2 (two) times daily. 01/19/18   Muthersbaugh, Jarrett Soho, PA-C  tamsulosin (FLOMAX) 0.4 MG CAPS capsule Take 1 capsule (0.4 mg total) by mouth daily. 02/25/18   Schuyler Amor, MD    Allergies Bee venom; Contrast media [iodinated diagnostic agents]; Eggs or egg-derived products; Penicillins; Latex; and Oxycodone-acetaminophen    Social History Social History    Tobacco Use  . Smoking status: Never Smoker  . Smokeless tobacco: Never Used  Substance Use Topics  . Alcohol use: Yes    Comment: holidays  . Drug use: No    Review of Systems Patient denies headaches, rhinorrhea, blurry vision, numbness, shortness of breath, chest pain, edema, cough, abdominal pain, nausea, vomiting, diarrhea, dysuria, fevers, rashes or hallucinations unless otherwise stated above in HPI. ____________________________________________   PHYSICAL EXAM:  VITAL SIGNS: Vitals:   02/28/18 1931  BP: 136/73  Pulse: 83  Resp: 18  Temp: 98.3 F (36.8 C)  SpO2: 99%    Constitutional: Alert and oriented. Well appearing and in no acute distress. Eyes: Conjunctivae are normal.  Head: Atraumatic. Nose: No congestion/rhinnorhea. Mouth/Throat: Mucous membranes are moist.   Neck: No stridor. Painless ROM.  Cardiovascular: Normal rate, regular rhythm. Grossly normal heart sounds.  Good peripheral circulation. Respiratory: Normal respiratory effort.  No retractions. Lungs CTAB. Gastrointestinal: Soft and nontender. No distention. No abdominal bruits. + left CVA tenderness. Genitourinary:  Musculoskeletal: No lower extremity tenderness nor edema.  No joint effusions. Neurologic:  Normal speech and language. No gross focal neurologic deficits are appreciated. No facial droop Skin:  Skin is warm, dry and intact. No rash noted. Psychiatric: Mood and affect are normal. Speech and behavior are normal.  ____________________________________________   LABS (all labs ordered are listed, but only abnormal results are displayed)  Results for orders placed or performed during the hospital encounter of 02/28/18 (from the past 24 hour(s))  CBC with Differential/Platelet     Status: Abnormal   Collection Time: 02/28/18  8:09 PM  Result Value Ref Range   WBC 6.4 3.6 - 11.0 K/uL   RBC 4.80 3.80 - 5.20 MIL/uL   Hemoglobin 12.8 12.0 - 16.0 g/dL   HCT 40.3 35.0 - 47.0 %   MCV 83.9  80.0 - 100.0 fL   MCH 26.6 26.0 - 34.0 pg   MCHC 31.7 (L) 32.0 - 36.0 g/dL   RDW 15.6 (H) 11.5 - 14.5 %   Platelets 272 150 - 440 K/uL   Neutrophils Relative % 66 %   Neutro Abs 4.3 1.4 - 6.5 K/uL   Lymphocytes Relative 18 %   Lymphs Abs 1.2 1.0 - 3.6 K/uL   Monocytes Relative 12 %   Monocytes Absolute 0.7 0.2 - 0.9 K/uL   Eosinophils Relative 3 %   Eosinophils Absolute 0.2 0 - 0.7 K/uL   Basophils Relative 1 %   Basophils Absolute 0.1 0 - 0.1 K/uL  Basic metabolic panel     Status: Abnormal   Collection Time: 02/28/18  8:09 PM  Result Value Ref Range   Sodium 140 135 - 145 mmol/L   Potassium 3.7 3.5 - 5.1 mmol/L   Chloride 105 101 - 111 mmol/L   CO2 26 22 - 32 mmol/L   Glucose, Bld 118 (H) 65 - 99 mg/dL   BUN 8 6 - 20 mg/dL   Creatinine, Ser 0.89 0.44 - 1.00 mg/dL   Calcium 8.9 8.9 - 10.3 mg/dL   GFR calc non Af Amer >60 >60 mL/min   GFR calc Af Amer >60 >60 mL/min   Anion gap 9 5 - 15  Urinalysis, Complete w Microscopic     Status: Abnormal   Collection Time: 02/28/18  8:09 PM  Result Value Ref Range   Color, Urine YELLOW (A) YELLOW   APPearance HAZY (A) CLEAR   Specific Gravity, Urine 1.012 1.005 - 1.030   pH 7.0 5.0 - 8.0   Glucose, UA NEGATIVE NEGATIVE mg/dL   Hgb urine dipstick SMALL (A) NEGATIVE   Bilirubin Urine NEGATIVE NEGATIVE   Ketones, ur NEGATIVE NEGATIVE mg/dL   Protein, ur NEGATIVE NEGATIVE mg/dL   Nitrite NEGATIVE NEGATIVE   Leukocytes, UA TRACE (A) NEGATIVE   RBC / HPF 0-5 0 - 5 RBC/hpf   WBC, UA 0-5 0 - 5 WBC/hpf   Bacteria, UA RARE (A) NONE SEEN   Squamous Epithelial / LPF 0-5 (A) NONE SEEN   Mucus PRESENT    ____________________________________________ ____________  ED ECG REPORT I, Merlyn Lot, the attending physician, personally viewed and interpreted this ECG.   Date: 03/08/2018  EKG Time: 21:36  Rate: 70  Rhythm: normal EKG, normal sinus rhythm, unchanged from previous tracings  Axis: normal  Intervals:normal intervals, no  stemi  ST&T Change: n acute abn   __________________  RADIOLOGY   ____________________________________________   PROCEDURES  Procedure(s) performed:  Procedures    Critical Care performed: no ____________________________________________   INITIAL IMPRESSION / ASSESSMENT AND PLAN / ED COURSE  Pertinent labs & imaging results that were available during my care of the patient were reviewed by me and considered in my medical decision making (see chart for details).  DDX: stone, uti, pyelo, cystitis, colitis  Carol Ortega is a 47 y.o. who presents to the ED with recent diagnosis of ureterolithiasis with hydronephrosis who presented to the ER today with intractable left flank pain.  Blood work sent to evaluate for the above differential shows no evidence of infectious process or AK I.  Abdominal exam is soft and benign.  Patient required multiple doses of IV medications including multimodal therapies including IV lidocaine and Toradol.  At this point patient's pain is still rated as a "7 out of 10 "based on her intractable pain I have discussed case with the hospitalist for admission for pain control.  Of also touch base with urology for further consultation.     ----------------------------------------- 10:24 PM on 02/28/2018 -----------------------------------------  I just spoke with Dr. Pilar Jarvis of urology.  Reports that there is no OR time available.  At this point patient does not meet any indication or need for emergent ureteroscopy or stent placement.  I informed her that there would not be any availability for OR stent placement tomorrow and patient demonstrates understanding and that she would actually prefer not to have any procedure performed but just wants to get her pain under control and transitioned back to oral pain medication.   As part of my medical decision making, I reviewed the following data within the [facility] notes reviewed and  incorporated, Labs reviewed, notes from prior ED visits and [place] Controlled Substance Database   ____________________________________________   FINAL CLINICAL IMPRESSION(S) / ED DIAGNOSES  Final diagnoses:  Left flank pain  Ureterolithiasis      NEW MEDICATIONS STARTED DURING THIS VISIT:  New Prescriptions   No medications on file     Note:  This document was prepared using Dragon voice recognition software and may include unintentional dictation errors.    Merlyn Lot, MD 02/28/18 2219    Merlyn Lot, MD 02/28/18 7371    Merlyn Lot, MD 03/08/18 [phone]

## 2018-02-28 NOTE — H&P (Signed)
[facility] [place] NAME: Carol Ortega    MR#:  [id number]  DATE OF BIRTH:  Dec 09, 1971  DATE OF ADMISSION:  02/28/2018  PRIMARY CARE PHYSICIAN: Berkley Harvey, NP   REQUESTING/REFERRING PHYSICIAN:   CHIEF COMPLAINT:  No chief complaint on file.   HISTORY OF PRESENT ILLNESS: Carol Ortega  is a 47 y.o. female with a known history per below, recently discharged from the emergency room with left 9 mm distal ureteral stone with moderate hydronephrosis on CT with plans for outpatient follow-up for lithotripsy, patient returns with continued uncontrollable pain associated with nausea, decreased p.o. intake, pain with urination, has been ongoing and worsening over the last 3 days, urinalysis slightly abnormal concerning for UTI, and discussion with ED attending-ED attending to notify urology, patient evaluated at the bedside, complained of 7-10 out of 10 type pain on the left flank which is improved with pain medicine given in the emergency room, family at the bedside, patient is now been admitted for acute left ureteral stone with moderate hydronephrosis and severe flank pain.  PAST MEDICAL HISTORY:   Past Medical History:  Diagnosis Date  . Anemia   . Anxiety   . Asthma    seasonal  . Brain tumor ([place]) 2014   tx with radiation  . Cancer ([place])   . Complication of anesthesia 1999   lung collapse after surgery for gallbladder  . Depression   . Headache    migraines  . Hoarseness of voice   . Hypertension   . Kidney stone   . Obesity   . Parathyroid abnormality ([place])   . Pulmonary emboli ([place]) 2011  . Radiation    Brain tumor  . UTI (urinary tract infection)   . Vitamin D deficiency     PAST SURGICAL HISTORY:  Past Surgical History:  Procedure Laterality Date  . CHOLECYSTECTOMY  1999  . CYSTOSCOPY W/ URETERAL STENT PLACEMENT Right 04/08/2017   Procedure: CYSTOSCOPY WITH RETROGRADE PYELOGRAM/URETERAL STENT PLACEMENT;  Surgeon:  Rana Snare, MD;  [facility];  Service: Urology;  Laterality: Right;  . CYSTOSCOPY W/ URETEROSCOPY    . CYSTOSCOPY WITH RETROGRADE PYELOGRAM, URETEROSCOPY AND STENT PLACEMENT Left 01/20/2014   Procedure: LEFT URETEROSCOPY WITH STENT PLACEMENT;  Surgeon: Irine Seal, MD;  [facility];  Service: Urology;  Laterality: Left;  . CYSTOSCOPY WITH RETROGRADE PYELOGRAM, URETEROSCOPY AND STENT PLACEMENT Bilateral 04/23/2015   Procedure: CYSTOSCOPY WITH BILATERAL RETROGRADE PYELOGRAM, URETEROSCOPY AND STENT PLACEMENT;  Surgeon: Alexis Frock, MD;  [facility];  Service: Urology;  Laterality: Bilateral;  . CYSTOSCOPY WITH RETROGRADE PYELOGRAM, URETEROSCOPY AND STENT PLACEMENT Right 04/13/2017   Procedure: CYSTOSCOPY WITH RETROGRADE PYELOGRAM, URETEROSCOPY AND STENT EXCHANGE;  Surgeon: Alexis Frock, MD;  [facility];  Service: Urology;  Laterality: Right;  . CYSTOSCOPY WITH STENT PLACEMENT Left 01/12/2014   Procedure: CYSTOSCOPY WITH STENT PLACEMENT left retrograde;  Surgeon: Irine Seal, MD;  [facility];  Service: Urology;  Laterality: Left;  . HOLMIUM LASER APPLICATION Left 01/18/3085   Procedure: HOLMIUM LASER APPLICATION;  Surgeon: Irine Seal, MD;  [facility];  Service: Urology;  Laterality: Left;  . HOLMIUM LASER APPLICATION Bilateral 04/23/8468   Procedure: HOLMIUM LASER APPLICATION;  Surgeon: Alexis Frock, MD;  [facility];  Service: Urology;  Laterality: Bilateral;  . HOLMIUM LASER APPLICATION Right 06/16/5283   Procedure: HOLMIUM LASER APPLICATION;  Surgeon: Alexis Frock, MD;  [facility];  Service: Urology;  Laterality: Right;  . kidney stone  removal    . LITHOTRIPSY    . PARATHYROIDECTOMY Right 11/13/2016   Procedure: RIGHT SUPERIOR PARATHYROIDECTOMY;  Surgeon: Armandina Gemma, MD;  Location: [facility];  Service: General;  Laterality: Right;  . surgery for,ectopic pregnancy  2011    1 fallopian tube rupture  . TUBAL LIGATION  2011    SOCIAL HISTORY:  Social  History   Tobacco Use  . Smoking status: Never Smoker  . Smokeless tobacco: Never Used  Substance Use Topics  . Alcohol use: Yes    Comment: holidays    FAMILY HISTORY:  Family History  Problem Relation Age of Onset  . Bell's palsy Mother   . Heart failure Mother   . Asthma Father   . Hypertension Father     DRUG ALLERGIES:  Allergies  Allergen Reactions  . Bee Venom Anaphylaxis  . Contrast Media [Iodinated Diagnostic Agents] Anaphylaxis  . Eggs Or Egg-Derived Products Anaphylaxis  . Penicillins Hives and Other (See Comments)    Has patient had a PCN reaction causing immediate rash, facial/tongue/throat swelling, SOB or lightheadedness with hypotension: No Has patient had a PCN reaction causing severe rash involving mucus membranes or skin necrosis: No Has patient had a PCN reaction that required hospitalization No Has patient had a PCN reaction occurring within the last 10 years: No If all of the above answers are "NO", then may proceed with Cephalosporin use.  . Latex Rash  . Oxycodone-Acetaminophen Itching    REVIEW OF SYSTEMS:   CONSTITUTIONAL: No fever, fatigue or weakness.  EYES: No blurred or double vision.  EARS, NOSE, AND THROAT: No tinnitus or ear pain.  RESPIRATORY: No cough, shortness of breath, wheezing or hemoptysis.  CARDIOVASCULAR: No chest pain, orthopnea, edema.  GASTROINTESTINAL: + nausea, vomiting, left flank pain   GENITOURINARY: + dysuria, hematuria.  ENDOCRINE: No polyuria, nocturia,  HEMATOLOGY: No anemia, easy bruising or bleeding SKIN: No rash or lesion. MUSCULOSKELETAL: No joint pain or arthritis.   NEUROLOGIC: No tingling, numbness, weakness.  PSYCHIATRY: No anxiety or depression.   MEDICATIONS AT HOME:  Prior to Admission medications   Medication Sig Start Date End Date Taking? Authorizing Provider  acetaminophen (TYLENOL) 500 MG tablet Take 1,000 mg by mouth every 6 (six) hours as needed for mild pain, moderate pain, fever or  headache.    Yes [provider]  albuterol (PROVENTIL HFA;VENTOLIN HFA) 108 (90 Base) MCG/ACT inhaler Inhale 2 puffs into the lungs every 6 (six) hours as needed for wheezing or shortness of breath.   Yes [provider]  albuterol (PROVENTIL) (2.5 MG/3ML) 0.083% nebulizer solution Inhale 2.5 mg into the lungs every 6 (six) hours as needed for wheezing or shortness of breath.    Yes [provider]  HYDROcodone-acetaminophen (NORCO) 5-325 MG tablet Take 1 tablet by mouth every 4 (four) hours as needed for moderate pain. 02/25/18  Yes Schuyler Amor, MD  lisinopril-hydrochlorothiazide (PRINZIDE,ZESTORETIC) 20-12.5 MG per tablet Take 1 tablet by mouth daily.    Yes [provider]  ondansetron (ZOFRAN) 4 MG tablet Take 1 tablet (4 mg total) by mouth daily as needed for nausea or vomiting. 02/25/18  Yes Schuyler Amor, MD  pantoprazole (PROTONIX) 40 MG tablet Take 40 mg by mouth daily.    Yes [provider]  tamsulosin (FLOMAX) 0.4 MG CAPS capsule Take 1 capsule (0.4 mg total) by mouth daily. 02/25/18  Yes Schuyler Amor, MD      PHYSICAL EXAMINATION:   VITAL SIGNS: Blood pressure 126/78, pulse  82, temperature 98.3 F (36.8 C), temperature source Oral, resp. rate 18, SpO2 100 %.  GENERAL:  47 y.o.-year-old patient lying in the bed with no acute distress.  Extreme morbid obesity EYES: Pupils equal, round, reactive to light and accommodation. No scleral icterus. Extraocular muscles intact.  HEENT: Head atraumatic, normocephalic. Oropharynx and nasopharynx clear.  NECK:  Supple, no jugular venous distention. No thyroid enlargement, no tenderness.  LUNGS: Normal breath sounds bilaterally, no wheezing, rales,rhonchi or crepitation. No use of accessory muscles of respiration.  CARDIOVASCULAR: S1, S2 normal. No murmurs, rubs, or gallops.  ABDOMEN: Soft, nontender, nondistended. Bowel sounds present. No organomegaly or mass.  Left CVA  tenderness EXTREMITIES: No pedal edema, cyanosis, or clubbing.  NEUROLOGIC: Cranial nerves II through XII are intact. MAES. Gait not checked.  PSYCHIATRIC: The patient is alert and oriented x 3.  SKIN: No obvious rash, lesion, or ulcer.   LABORATORY PANEL:   CBC Recent Labs  Lab 02/28/18 2009  WBC 6.4  HGB 12.8  HCT 40.3  PLT 272  MCV 83.9  MCH 26.6  MCHC 31.7*  RDW 15.6*  LYMPHSABS 1.2  MONOABS 0.7  EOSABS 0.2  BASOSABS 0.1   ------------------------------------------------------------------------------------------------------------------  Chemistries  Recent Labs  Lab 02/25/18 1802 02/28/18 2009  NA 135 140  K 4.1 3.7  CL 101 105  CO2 23 26  GLUCOSE 111* 118*  BUN 12 8  CREATININE 0.94 0.89  CALCIUM 8.8* 8.9  AST 22  --   ALT 11*  --   ALKPHOS 70  --   BILITOT 0.8  --    ------------------------------------------------------------------------------------------------------------------ estimated creatinine clearance is 114.5 mL/min (by C-G formula based on SCr of 0.89 mg/dL). ------------------------------------------------------------------------------------------------------------------ No results for input(s): TSH, T4TOTAL, T3FREE, THYROIDAB in the last 72 hours.  Invalid input(s): FREET3   Coagulation profile No results for input(s): INR, PROTIME in the last 168 hours. ------------------------------------------------------------------------------------------------------------------- No results for input(s): DDIMER in the last 72 hours. -------------------------------------------------------------------------------------------------------------------  Cardiac Enzymes No results for input(s): CKMB, TROPONINI, MYOGLOBIN in the last 168 hours.  Invalid input(s): CK ------------------------------------------------------------------------------------------------------------------ Invalid input(s):  POCBNP  ---------------------------------------------------------------------------------------------------------------  Urinalysis    Component Value Date/Time   COLORURINE YELLOW (A) 02/28/2018 2009   APPEARANCEUR HAZY (A) 02/28/2018 2009   LABSPEC 1.012 02/28/2018 2009   PHURINE 7.0 02/28/2018 2009   GLUCOSEU NEGATIVE 02/28/2018 2009   HGBUR SMALL (A) 02/28/2018 2009   BILIRUBINUR NEGATIVE 02/28/2018 2009   KETONESUR NEGATIVE 02/28/2018 2009   PROTEINUR NEGATIVE 02/28/2018 2009   UROBILINOGEN 1.0 10/23/2015 0556   NITRITE NEGATIVE 02/28/2018 2009   LEUKOCYTESUR TRACE (A) 02/28/2018 2009     RADIOLOGY: No results found.  EKG: Orders placed or performed during the hospital encounter of 02/28/18  . ED EKG  . ED EKG  . EKG 12-Lead  . EKG 12-Lead    IMPRESSION AND PLAN: 1 acute left ureteral stone with moderate hydronephrosis with associated intractable pain, nausea, dysuria, poor p.o. intake  Admit to regular nursing for bed, urology to see-notified by ED attending, empiric Rocephin, follow-up on cultures, adult pain protocol, antiemetics PRN, BMP daily, continue close medical monitoring  2 acute possible urinary tract infection Exacerbated by above Plan of care per above  3 extreme morbid obesity, chronic Most likely secondary to excess calories Lifestyle modification recommended  4 history of asthma without exacerbation Stable Breathing treatments as needed  5 chronic benign essential hypertension Stable Continue home regiment   Full code Condition stable Prognosis good DVT prophylaxis with heparin subcu Disposition pending  clinical course/clearance by urology  All the records are reviewed and case discussed with ED provider. Management plans discussed with the patient, family and they are in agreement.  Code Status History    Date Active Date Inactive Code Status Order ID Comments User Context   04/09/2017 01:24 04/09/2017 18:46 Full Code [id number]   Rana Snare, MD Inpatient       TOTAL TIME TAKING CARE OF THIS PATIENT: 45 minutes.    Avel Peace Amyah Clawson M.D on 02/28/2018   Between 7am to 6pm - Pager - [phone]  After 6pm go to [email or web address] - password [email or web address] [facility]  Office  [phone]  CC: Primary care physician; Berkley Harvey, NP   Note: This dictation was prepared with Dragon dictation along with smaller phrase technology. Any transcriptional errors that result from this process are unintentional.

## 2018-03-01 ENCOUNTER — Encounter: Payer: Self-pay | Admitting: *Deleted

## 2018-03-01 ENCOUNTER — Inpatient Hospital Stay: Payer: Medicaid Other | Admitting: Anesthesiology

## 2018-03-01 ENCOUNTER — Other Ambulatory Visit: Payer: Self-pay

## 2018-03-01 ENCOUNTER — Encounter
Admission: EM | Disposition: A | Discharge status: Home / Self Care | Payer: Self-pay | Source: Home / Self Care | Attending: Internal Medicine

## 2018-03-01 DIAGNOSIS — N132 Hydronephrosis with renal and ureteral calculous obstruction: Secondary | ICD-10-CM | POA: Diagnosis not present

## 2018-03-01 HISTORY — PX: CYSTOSCOPY W/ RETROGRADES: SHX1426

## 2018-03-01 HISTORY — PX: CYSTOSCOPY WITH STENT PLACEMENT: SHX5790

## 2018-03-01 LAB — BASIC METABOLIC PANEL
ANION GAP: 8 (ref 5–15)
BUN: 8 mg/dL (ref 6–20)
CALCIUM: 8.5 mg/dL — AB (ref 8.9–10.3)
CO2: 28 mmol/L (ref 22–32)
Chloride: 106 mmol/L (ref 101–111)
Creatinine, Ser: 0.86 mg/dL (ref 0.44–1.00)
GFR calc Af Amer: >60 mL/min (ref >60)
GLUCOSE: 95 mg/dL (ref 65–99)
Potassium: 3.8 mmol/L (ref 3.5–5.1)
Sodium: 142 mmol/L (ref 135–145)

## 2018-03-01 LAB — CBC
HCT: 36.7 % (ref 35.0–47.0)
Hemoglobin: 11.6 g/dL — ABNORMAL LOW (ref 12.0–16.0)
MCH: 26.7 pg (ref 26.0–34.0)
MCHC: 31.5 g/dL — AB (ref 32.0–36.0)
MCV: 84.6 fL (ref 80.0–100.0)
PLATELETS: 272 10*3/uL (ref 150–440)
RBC: 4.33 MIL/uL (ref 3.80–5.20)
RDW: 15.7 % — ABNORMAL HIGH (ref 11.5–14.5)
WBC: 5.2 10*3/uL (ref 3.6–11.0)

## 2018-03-01 LAB — PREGNANCY, URINE: PREG TEST UR: NEGATIVE

## 2018-03-01 SURGERY — CYSTOSCOPY, WITH RETROGRADE PYELOGRAM
Anesthesia: General | Site: Ureter | Laterality: Left | Wound class: Clean Contaminated

## 2018-03-01 MED ORDER — LIDOCAINE 2% (20 MG/ML) 5 ML SYRINGE
INTRAMUSCULAR | Status: DC | PRN
  Administered 2018-03-01: 100 mg via INTRAVENOUS

## 2018-03-01 MED ORDER — CIPROFLOXACIN IN D5W 400 MG/200ML IV SOLN
INTRAVENOUS | Status: AC
  Filled 2018-03-01: qty 200

## 2018-03-01 MED ORDER — ALBUTEROL SULFATE HFA 108 (90 BASE) MCG/ACT IN AERS
INHALATION_SPRAY | RESPIRATORY_TRACT | Status: DC | PRN
  Administered 2018-03-01: 6 via RESPIRATORY_TRACT

## 2018-03-01 MED ORDER — LIDOCAINE HCL (PF) 2 % IJ SOLN
INTRAMUSCULAR | Status: AC
  Filled 2018-03-01: qty 10

## 2018-03-01 MED ORDER — GLYCOPYRROLATE 0.2 MG/ML IJ SOLN
INTRAMUSCULAR | Status: DC | PRN
  Administered 2018-03-01: 0.2 mg via INTRAVENOUS

## 2018-03-01 MED ORDER — OXYBUTYNIN CHLORIDE 5 MG PO TABS: 5.0000 mg | ORAL_TABLET | Freq: Three times a day (TID) | ORAL | Status: DC | PRN

## 2018-03-01 MED ORDER — IOTHALAMATE MEGLUMINE 43 % IV SOLN
INTRAVENOUS | Status: DC | PRN
Start: 2018-03-01 — End: 2018-03-01
  Administered 2018-03-01: 20 mL via URETHRAL

## 2018-03-01 MED ORDER — PROPOFOL 10 MG/ML IV BOLUS
INTRAVENOUS | Status: DC | PRN
  Administered 2018-03-01: 200 mg via INTRAVENOUS

## 2018-03-01 MED ORDER — IPRATROPIUM-ALBUTEROL 0.5-2.5 (3) MG/3ML IN SOLN
3.0000 mL | Freq: Once | RESPIRATORY_TRACT | Status: AC
  Administered 2018-03-01: 3 mL via RESPIRATORY_TRACT

## 2018-03-01 MED ORDER — IPRATROPIUM-ALBUTEROL 0.5-2.5 (3) MG/3ML IN SOLN
RESPIRATORY_TRACT | Status: AC
  Administered 2018-03-01: 3 mL via RESPIRATORY_TRACT
  Filled 2018-03-01: qty 3

## 2018-03-01 MED ORDER — ONDANSETRON HCL 4 MG/2ML IJ SOLN
INTRAMUSCULAR | Status: AC
  Filled 2018-03-01: qty 2

## 2018-03-01 MED ORDER — PROPOFOL 500 MG/50ML IV EMUL
INTRAVENOUS | Status: AC
  Filled 2018-03-01: qty 50

## 2018-03-01 MED ORDER — MIDAZOLAM HCL 5 MG/5ML IJ SOLN
INTRAMUSCULAR | Status: DC | PRN
  Administered 2018-03-01: 2 mg via INTRAVENOUS

## 2018-03-01 MED ORDER — DEXAMETHASONE SODIUM PHOSPHATE 10 MG/ML IJ SOLN
INTRAMUSCULAR | Status: DC | PRN
  Administered 2018-03-01: 10 mg via INTRAVENOUS

## 2018-03-01 MED ORDER — FENTANYL CITRATE (PF) 100 MCG/2ML IJ SOLN
25.0000 ug | INTRAMUSCULAR | Status: DC | PRN
  Administered 2018-03-01: 50 ug via INTRAVENOUS
  Administered 2018-03-01 (×2): 25 ug via INTRAVENOUS
  Administered 2018-03-01: 50 ug via INTRAVENOUS

## 2018-03-01 MED ORDER — FENTANYL CITRATE (PF) 100 MCG/2ML IJ SOLN
INTRAMUSCULAR | Status: AC
  Administered 2018-03-01: 25 ug via INTRAVENOUS
  Filled 2018-03-01: qty 2

## 2018-03-01 MED ORDER — MIDAZOLAM HCL 2 MG/2ML IJ SOLN
INTRAMUSCULAR | Status: AC
  Filled 2018-03-01: qty 2

## 2018-03-01 MED ORDER — FENTANYL CITRATE (PF) 100 MCG/2ML IJ SOLN
INTRAMUSCULAR | Status: AC
  Filled 2018-03-01: qty 2

## 2018-03-01 MED ORDER — DEXAMETHASONE SODIUM PHOSPHATE 10 MG/ML IJ SOLN
INTRAMUSCULAR | Status: AC
  Filled 2018-03-01: qty 1

## 2018-03-01 MED ORDER — GLYCOPYRROLATE 0.2 MG/ML IJ SOLN
INTRAMUSCULAR | Status: AC
  Filled 2018-03-01: qty 1

## 2018-03-01 SURGICAL SUPPLY — 24 items
BAG DRAIN CYSTO-URO LG1000N (MISCELLANEOUS) ×3 IMPLANT
BRUSH SCRUB EZ  4% CHG (MISCELLANEOUS) ×2
BRUSH SCRUB EZ 4% CHG (MISCELLANEOUS) ×1 IMPLANT
CATH URETL 5X70 OPEN END (CATHETERS) ×3 IMPLANT
CONRAY 43 FOR UROLOGY 50M (MISCELLANEOUS) ×3 IMPLANT
DRAPE UTILITY 15X26 TOWEL STRL (DRAPES) ×3 IMPLANT
GLOVE BIO SURGEON STRL SZ8 (GLOVE) ×3 IMPLANT
GLOVE BIOGEL PI IND STRL 8 (GLOVE) ×1 IMPLANT
GLOVE BIOGEL PI INDICATOR 8 (GLOVE) ×2
GOWN STANDARD XL  REUSABL (MISCELLANEOUS) ×3 IMPLANT
GOWN STRL REUS W/ TWL LRG LVL3 (GOWN DISPOSABLE) ×2 IMPLANT
GOWN STRL REUS W/TWL LRG LVL3 (GOWN DISPOSABLE) ×4
KIT TURNOVER CYSTO (KITS) ×3 IMPLANT
PACK CYSTO AR (MISCELLANEOUS) ×3 IMPLANT
SENSORWIRE 0.038 NOT ANGLED (WIRE) ×3
SET CYSTO W/LG BORE CLAMP LF (SET/KITS/TRAYS/PACK) ×3 IMPLANT
SOL .9 NS 3000ML IRR  AL (IV SOLUTION) ×2
SOL .9 NS 3000ML IRR UROMATIC (IV SOLUTION) ×1 IMPLANT
STENT URET 6FRX24 CONTOUR (STENTS) ×3 IMPLANT
STENT URET 6FRX26 CONTOUR (STENTS) IMPLANT
SURGILUBE 2OZ TUBE FLIPTOP (MISCELLANEOUS) ×3 IMPLANT
SYRINGE IRR TOOMEY STRL 70CC (SYRINGE) ×3 IMPLANT
WATER STERILE IRR 1000ML POUR (IV SOLUTION) ×3 IMPLANT
WIRE SENSOR 0.038 NOT ANGLED (WIRE) ×1 IMPLANT

## 2018-03-01 NOTE — Progress Notes (Signed)
[place] [facility] [place] NAME: Carol Ortega    MR#:  [id number]  DATE OF BIRTH:  [birth year]  SUBJECTIVE:   Patient here due to left flank pain and noted to have nephrolithiasis. Seen by urology today and underwent left-sided ureteral stent placement. Creatinine stable, patient continues to have some mild pain after stent placement.  REVIEW OF SYSTEMS:    Review of Systems  Constitutional: Negative for chills and fever.  HENT: Negative for congestion and tinnitus.   Eyes: Negative for blurred vision and double vision.  Respiratory: Negative for cough, shortness of breath and wheezing.   Cardiovascular: Negative for chest pain, orthopnea and PND.  Gastrointestinal: Positive for abdominal pain (Left Flank). Negative for diarrhea, nausea and vomiting.  Genitourinary: Negative for dysuria and hematuria.  Neurological: Negative for dizziness, sensory change and focal weakness.  All other systems reviewed and are negative.   Nutrition: Regular Tolerating Diet: yes Tolerating PT: Ambulatory   DRUG ALLERGIES:   Allergies  Allergen Reactions  . Bee Venom Anaphylaxis  . Contrast Media [Iodinated Diagnostic Agents] Anaphylaxis  . Eggs Or Egg-Derived Products Anaphylaxis  . Penicillins Hives and Other (See Comments)    Has patient had a PCN reaction causing immediate rash, facial/tongue/throat swelling, SOB or lightheadedness with hypotension: No Has patient had a PCN reaction causing severe rash involving mucus membranes or skin necrosis: No Has patient had a PCN reaction that required hospitalization No Has patient had a PCN reaction occurring within the last 10 years: No If all of the above answers are "NO", then may proceed with Cephalosporin use.  . Latex Rash  . Oxycodone-Acetaminophen Itching    VITALS:  Blood pressure 127/74, pulse 94, temperature 98.3 F (36.8 C), temperature source Oral, resp. rate 18, height 5' 5.98" (1.676 m),  weight (!) 141.5 kg (312 lb), SpO2 96 %.  PHYSICAL EXAMINATION:   Physical Exam  GENERAL:  47 y.o.-year-old obese patient lying in bed in no acute distress.  EYES: Pupils equal, round, reactive to light and accommodation. No scleral icterus. Extraocular muscles intact.  HEENT: Head atraumatic, normocephalic. Oropharynx and nasopharynx clear.  NECK:  Supple, no jugular venous distention. No thyroid enlargement, no tenderness.  LUNGS: Normal breath sounds bilaterally, no wheezing, rales, rhonchi. No use of accessory muscles of respiration.  CARDIOVASCULAR: S1, S2 normal. No murmurs, rubs, or gallops.  ABDOMEN: Soft, left Flank pain,nondistended. Bowel sounds present. No organomegaly or mass.  EXTREMITIES: No cyanosis, clubbing or edema b/l.    NEUROLOGIC: Cranial nerves II through XII are intact. No focal Motor or sensory deficits b/l.   PSYCHIATRIC: The patient is alert and oriented x 3.  SKIN: No obvious rash, lesion, or ulcer.    LABORATORY PANEL:   CBC Recent Labs  Lab 03/01/18 0646  WBC 5.2  HGB 11.6*  HCT 36.7  PLT 272   ------------------------------------------------------------------------------------------------------------------  Chemistries  Recent Labs  Lab 02/25/18 1802  03/01/18 0646  NA 135   < > 142  K 4.1   < > 3.8  CL 101   < > 106  CO2 23   < > 28  GLUCOSE 111*   < > 95  BUN 12   < > 8  CREATININE 0.94   < > 0.86  CALCIUM 8.8*   < > 8.5*  AST 22  --   --   ALT 11*  --   --   ALKPHOS 70  --   --   BILITOT  0.8  --   --    < > = values in this interval not displayed.   ------------------------------------------------------------------------------------------------------------------  Cardiac Enzymes No results for input(s): TROPONINI in the last 168 hours. ------------------------------------------------------------------------------------------------------------------  RADIOLOGY:  Dg Abdomen 1 View  Result Date: 02/28/2018 CLINICAL DATA:   Acute onset of left flank pain, radiating to the abdomen. EXAM: ABDOMEN - 1 VIEW COMPARISON:  CT of the abdomen and pelvis performed 02/25/2018 FINDINGS: The visualized bowel gas pattern is unremarkable. Scattered air and stool filled loops of colon are seen; no abnormal dilatation of small bowel loops is seen to suggest small bowel obstruction. No free intra-abdominal air is identified, though evaluation for free air is limited on a single supine view. The large 1.2 cm obstructing stone is again noted at the left ureteropelvic junction, just below the left renal pelvis. A nonobstructing stone is again noted at the lower pole of the left kidney. The visualized osseous structures are within normal limits; the sacroiliac joints are unremarkable in appearance. The visualized lung bases are essentially clear. Clips are noted within the right upper quadrant, reflecting prior cholecystectomy. IMPRESSION: 1. 1.2 cm obstructing stone again noted at the left ureteropelvic junction, just below the left renal pelvis, grossly unchanged in appearance. This is unlikely to pass on its own, given its size. 2. Nonobstructing stone again noted at the lower pole of the left kidney. Electronically Signed   By: Garald Balding M.D.   On: 02/28/2018 23:06     ASSESSMENT AND PLAN:   47 year old female with past medical history of essential hypertension, GERD, previous history of nephrolithiasis, obesity,, anxiety/depression who presented to the hospital due to left-sided flank pain and noted to have nephrolithiasis.  1. Nephrolithiasis-this is the cause of patient's left-sided flank pain with nausea. Seen by urology is status post left-sided ureteral stent placement. Patient still continues to have some pain and spasms. Continue supportive care with pain control, IV fluids, oxybutynin, Flomax. -Continue further care as per urology.  2. HTN - cont. Lisinopril/HCTZ  3. GERD - cont. Protonix.    All the records are reviewed  and case discussed with Care Management/Social Worker. Management plans discussed with the patient, family and they are in agreement.  CODE STATUS: Full code  DVT Prophylaxis: Hep SQ  TOTAL TIME TAKING CARE OF THIS PATIENT: 30 minutes.   POSSIBLE D/C IN 1-2 DAYS, DEPENDING ON CLINICAL CONDITION.   Henreitta Leber M.D on 03/01/2018 at 4:02 PM  Between 7am to 6pm - Pager - [phone]  After 6pm go to [email or web address] - Proofreader  [facility]  Office  [phone]  CC: Primary care physician; Berkley Harvey, NP

## 2018-03-01 NOTE — Anesthesia Postprocedure Evaluation (Signed)
Anesthesia Post Note  Patient: Carol Ortega  Procedure(s) Performed: CYSTOSCOPY WITH RETROGRADE PYELOGRAM (Left Ureter) CYSTOSCOPY WITH STENT PLACEMENT (Left Ureter)  Patient location during evaluation: PACU Anesthesia Type: General Level of consciousness: awake and alert Pain management: pain level controlled Vital Signs Assessment: post-procedure vital signs reviewed and stable Respiratory status: spontaneous breathing, nonlabored ventilation, respiratory function stable and patient connected to nasal cannula oxygen Cardiovascular status: blood pressure returned to baseline and stable Postop Assessment: no apparent nausea or vomiting Anesthetic complications: no     Last Vitals:  Vitals:   03/01/18 1346 03/01/18 1446  BP: 139/79 114/73  Pulse: 85 95  Resp: 18   Temp:    SpO2: 91% 94%    Last Pain:  Vitals:   03/01/18 1500  TempSrc:   PainSc: 6                  Precious Haws Karion Cudd

## 2018-03-01 NOTE — Anesthesia Preprocedure Evaluation (Signed)
Anesthesia Evaluation  Patient identified by MRN, date of birth, ID band Patient awake    Reviewed: Allergy & Precautions, H&P , NPO status , Patient's Chart, lab work & pertinent test results  History of Anesthesia Complications (+) history of anesthetic complications  Airway Mallampati: I  TM Distance: >3 FB Neck ROM: full    Dental  (+) Chipped   Pulmonary neg shortness of breath, asthma ,           Cardiovascular Exercise Tolerance: Good hypertension, (-) angina(-) Past MI and (-) DOE      Neuro/Psych  Headaches, PSYCHIATRIC DISORDERS Anxiety Depression    GI/Hepatic negative GI ROS, Neg liver ROS, neg GERD  ,  Endo/Other  negative endocrine ROS  Renal/GU Renal disease     Musculoskeletal   Abdominal   Peds  Hematology negative hematology ROS (+)   Anesthesia Other Findings Past Medical History: No date: Anemia No date: Anxiety No date: Asthma     Comment:  seasonal 2014: Brain tumor ([place])     Comment:  tx with radiation No date: Cancer ([place]) 8546: Complication of anesthesia     Comment:  lung collapse after surgery for gallbladder No date: Depression No date: Headache     Comment:  migraines No date: Hoarseness of voice No date: Hypertension No date: Kidney stone No date: Obesity 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[place]) 2011: Pulmonary emboli ([facility]) No date: Radiation     Comment:  Brain tumor No date: UTI (urinary tract infection) No date: Vitamin D deficiency  Past Surgical History: 1999: CHOLECYSTECTOMY 04/08/2017: CYSTOSCOPY W/ URETERAL STENT PLACEMENT; Right     Comment:  Procedure: CYSTOSCOPY WITH RETROGRADE PYELOGRAM/URETERAL              STENT PLACEMENT;  Surgeon: Rana Snare, MD;  Location:               [facility];  Service: Urology;  Laterality: Right; No date: CYSTOSCOPY W/ URETEROSCOPY 01/20/2014: CYSTOSCOPY WITH RETROGRADE PYELOGRAM, URETEROSCOPY AND  STENT PLACEMENT; Left      Comment:  Procedure: LEFT URETEROSCOPY WITH STENT PLACEMENT;                Surgeon: Irine Seal, MD;  Location: [facility];  Service:               Urology;  Laterality: Left; 04/23/2015: CYSTOSCOPY WITH RETROGRADE PYELOGRAM, URETEROSCOPY AND  STENT PLACEMENT; Bilateral     Comment:  Procedure: CYSTOSCOPY WITH BILATERAL RETROGRADE               PYELOGRAM, URETEROSCOPY AND STENT PLACEMENT;  Surgeon:               Alexis Frock, MD;  Location: [facility];  Service: Urology;              Laterality: Bilateral; 04/13/2017: CYSTOSCOPY WITH RETROGRADE PYELOGRAM, URETEROSCOPY AND  STENT PLACEMENT; Right     Comment:  Procedure: CYSTOSCOPY WITH RETROGRADE PYELOGRAM,               URETEROSCOPY AND STENT EXCHANGE;  Surgeon: Alexis Frock, MD;  Location: [facility];  Service: Urology;                Laterality: Right; 01/12/2014: CYSTOSCOPY WITH STENT PLACEMENT; Left     Comment:  Procedure: CYSTOSCOPY WITH STENT PLACEMENT left  retrograde;  Surgeon: Irine Seal, MD;  Location: [facility];               Service: Urology;  Laterality: Left; 01/20/2014: HOLMIUM LASER APPLICATION; Left     Comment:  Procedure: HOLMIUM LASER APPLICATION;  Surgeon: Irine Seal, MD;  Location: [facility];  Service: Urology;                Laterality: Left; 04/23/2015: HOLMIUM LASER APPLICATION; Bilateral     Comment:  Procedure: HOLMIUM LASER APPLICATION;  Surgeon: Alexis Frock, MD;  Location: [facility];  Service: Urology;                Laterality: Bilateral; 04/13/2017: HOLMIUM LASER APPLICATION; Right     Comment:  Procedure: HOLMIUM LASER APPLICATION;  Surgeon: Alexis Frock, MD;  Location: [facility];  Service: Urology;                Laterality: Right; No date: kidney stone removal No date: LITHOTRIPSY 11/13/2016: PARATHYROIDECTOMY; Right     Comment:  Procedure: RIGHT SUPERIOR PARATHYROIDECTOMY;  Surgeon:               Armandina Gemma, MD;  Location: [facility];  Service: General;                 Laterality: Right; 2011: surgery for,ectopic pregnancy     Comment:   1 fallopian tube rupture 2011: TUBAL LIGATION    Signs and symptoms suggestive of sleep apnea    Reproductive/Obstetrics negative OB ROS                             Anesthesia Physical Anesthesia Plan  ASA: III  Anesthesia Plan: General   Post-op Pain Management:    Induction: Intravenous  PONV Risk Score and Plan: Ondansetron, Dexamethasone and Midazolam  Airway Management Planned: LMA  Additional Equipment:   Intra-op Plan:   Post-operative Plan: Extubation in OR  Informed Consent: I have reviewed the patients History and Physical, chart, labs and discussed the procedure including the risks, benefits and alternatives for the proposed anesthesia with the patient or authorized representative who has indicated his/her understanding and acceptance.   Dental Advisory Given  Plan Discussed with: Anesthesiologist, CRNA and Surgeon  Anesthesia Plan Comments: (Patient consented for risks of anesthesia including but not limited to:  - adverse reactions to medications - damage to teeth, lips or other oral mucosa - sore throat or hoarseness - Damage to heart, brain, lungs or loss of life  Patient voiced understanding.)        Anesthesia Quick Evaluation

## 2018-03-01 NOTE — Anesthesia Post-op Follow-up Note (Signed)
Anesthesia QCDR form completed.        

## 2018-03-01 NOTE — Anesthesia Procedure Notes (Signed)
Procedure Name: LMA Insertion Date/Time: 03/01/2018 12:24 PM Performed by: Marsh Dolly, CRNA Pre-anesthesia Checklist: Patient identified, Patient being monitored, Timeout performed, Emergency Drugs available and Suction available Patient Re-evaluated:Patient Re-evaluated prior to induction Oxygen Delivery Method: Circle system utilized Preoxygenation: Pre-oxygenation with 100% oxygen Induction Type: IV induction Ventilation: Mask ventilation without difficulty LMA: LMA inserted LMA Size: 4.0 Tube type: Oral Number of attempts: 1 Placement Confirmation: positive ETCO2 and breath sounds checked- equal and bilateral Tube secured with: Tape Dental Injury: Teeth and Oropharynx as per pre-operative assessment

## 2018-03-01 NOTE — Op Note (Signed)
Preoperative diagnosis:  1. Left proximal ureteral calculus with obstruction 2. Left nephrolithiasis  Postoperative diagnosis:  1. Same  Procedure:  1. Cystoscopy 2. Left ureteral stent placement 3. Left retrograde pyelography with interpretation  Surgeon: Nicki Reaper C. Stoioff, M.D.  Anesthesia: General  Complications: None  Intraoperative findings:  Left retrograde pyelogram: Normal caliber distal, mid and lower proximal ureter.  9 mm calculus noted in the upper proximal ureter with mild ureteral dilation and left hydronephrosis.  EBL: Minimal  Specimens: None  Indication: Carol Ortega is a 47 y.o. patient admitted for pain control secondary to a 9 mm left proximal ureteral calculus.  After reviewing the management options for treatment, he elected to proceed with the above surgical procedure(s). We have discussed the potential benefits and risks of the procedure, side effects of the proposed treatment, the likelihood of the patient achieving the goals of the procedure, and any potential problems that might occur during the procedure or recuperation. Informed consent has been obtained.  Description of procedure:  The patient was taken to the operating room and general anesthesia was induced.  The patient was placed in the dorsal lithotomy position, prepped and draped in the usual sterile fashion, and preoperative antibiotics were administered. A preoperative time-out was performed.   Cystourethroscopy was performed.  The patient's urethra was examined and was normal in appearance. The bladder was then systematically examined in its entirety. There was no evidence for any bladder tumors, stones, or other mucosal pathology.    Attention then turned to the left ureteral orifice and a ureteral catheter was used to intubate the ureteral orifice.  Omnipaque contrast was injected through the ureteral catheter and a retrograde pyelogram was performed with findings as dictated above.  A  0.38 sensor guidewire was then placed through the ureteral catheter and advanced up the left ureter into the renal pelvis under fluoroscopic guidance.  The ureteral catheter was removed and a 6 French/24 cm double-J ureteral stent was advance over the wire using Seldinger technique.  The stent was positioned appropriately under fluoroscopic and cystoscopic guidance.  The wire was then removed with an adequate stent curl noted in the renal pelvis as well as in the bladder.  The bladder was then emptied and the procedure ended.  The patient appeared to tolerate the procedure well and without complications.  The patient was able to be awakened and transferred to the recovery unit in satisfactory condition.   Recommendation: 1.  The patient may be discharged if pain control adequate on oral medication. 2.  We will arrange follow-up to schedule definitive stone treatment.

## 2018-03-01 NOTE — Transfer of Care (Signed)
Immediate Anesthesia Transfer of Care Note  Patient: Carol Ortega  Procedure(s) Performed: CYSTOSCOPY WITH RETROGRADE PYELOGRAM (Left Ureter) CYSTOSCOPY WITH STENT PLACEMENT (Left Ureter)  Patient Location: PACU  Anesthesia Type:General  Level of Consciousness: awake, alert  and oriented  Airway & Oxygen Therapy: Patient Spontanous Breathing and Patient connected to face mask oxygen  Post-op Assessment: Report given to RN and Post -op Vital signs reviewed and stable  Post vital signs: Reviewed and stable  Last Vitals:  Vitals:   03/01/18 1059 03/01/18 1215  BP: 127/81 (!) 102/59  Pulse: 73 (!) 102  Resp: 20 13  Temp: (!) 35.9 C (!) 36.1 C  SpO2: 100% 93%    Last Pain:  Vitals:   03/01/18 1059  TempSrc: Tympanic  PainSc: 7          Complications: No apparent anesthesia complications

## 2018-03-01 NOTE — H&P (View-Only) (Signed)
03/01/2018 8:25 AM   Carol Ortega 1971-06-03 [id number]  Referring provider: Dr. 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  She also had a 1 cm left lower pole calculus.  These were both seen on her initial CT scan as well as her follow-up KUB last night.  At this point, she has no signs of sepsis or infection but her pain is intractable.  She is still requiring IV pain medication.  She has no fevers or chills.  She still reports though severe left flank pain.  She does have a significant history of parathyroidism resulting in recurrent nephrolithiasis.  She has had at least 7 procedures for her stones.  They have mostly been treated via ureteroscopy.  She did undergo lithotripsy at one point for a 1.1 cm stone that resulted in Steinstrasse that then required ureteroscopy.  Despite parathyroidism being the apparent cause of her recurrent nephrolithiasis, she has developed 3 stones since having parathyroid surgery.     PMH: Past Medical History:  Diagnosis Date  . Anemia   . Anxiety   . Asthma    seasonal  . Brain tumor ([place]) 2014   tx with radiation  . Cancer ([place])   . Complication of anesthesia 1999   lung collapse after surgery for gallbladder  . Depression   . Headache    migraines  . Hoarseness of voice   . Hypertension   . Kidney stone   . Obesity   . Parathyroid abnormality ([place])   . Pulmonary emboli ([place]) 2011  . Radiation    Brain tumor  . UTI (urinary tract infection)   . Vitamin D deficiency     Surgical History: Past Surgical History:  Procedure Laterality Date  . CHOLECYSTECTOMY  1999  . CYSTOSCOPY W/ URETERAL STENT PLACEMENT Right 04/08/2017   Procedure: CYSTOSCOPY WITH RETROGRADE PYELOGRAM/URETERAL STENT PLACEMENT;  Surgeon: Rana Snare,  MD;  [facility];  Service: Urology;  Laterality: Right;  . CYSTOSCOPY W/ URETEROSCOPY    . CYSTOSCOPY WITH RETROGRADE PYELOGRAM, URETEROSCOPY AND STENT PLACEMENT Left 01/20/2014   Procedure: LEFT URETEROSCOPY WITH STENT PLACEMENT;  Surgeon: Irine Seal, MD;  [facility];  Service: Urology;  Laterality: Left;  . CYSTOSCOPY WITH RETROGRADE PYELOGRAM, URETEROSCOPY AND STENT PLACEMENT Bilateral 04/23/2015   Procedure: CYSTOSCOPY WITH BILATERAL RETROGRADE PYELOGRAM, URETEROSCOPY AND STENT PLACEMENT;  Surgeon: Alexis Frock, MD;  [facility];  Service: Urology;  Laterality: Bilateral;  . CYSTOSCOPY WITH RETROGRADE PYELOGRAM, URETEROSCOPY AND STENT PLACEMENT Right 04/13/2017   Procedure: CYSTOSCOPY WITH RETROGRADE PYELOGRAM, URETEROSCOPY AND STENT EXCHANGE;  Surgeon: Alexis Frock, MD;  [facility];  Service: Urology;  Laterality: Right;  . CYSTOSCOPY WITH STENT PLACEMENT Left 01/12/2014   Procedure: CYSTOSCOPY WITH STENT PLACEMENT left retrograde;  Surgeon: Irine Seal, MD;  [facility];  Service: Urology;  Laterality: Left;  . HOLMIUM LASER APPLICATION Left 06/25/2955   Procedure: HOLMIUM LASER APPLICATION;  Surgeon: Irine Seal, MD;  [facility];  Service: Urology;  Laterality: Left;  . HOLMIUM LASER APPLICATION Bilateral 01/18/3085   Procedure: HOLMIUM LASER APPLICATION;  Surgeon: Alexis Frock, MD;  [facility];  Service: Urology;  Laterality: Bilateral;  . HOLMIUM LASER APPLICATION Right 5/78/4696   Procedure: HOLMIUM LASER APPLICATION;  Surgeon: Alexis Frock, MD;  [facility];  Service: Urology;  Laterality: Right;  . kidney stone removal    . LITHOTRIPSY    . PARATHYROIDECTOMY Right 11/13/2016   Procedure: RIGHT SUPERIOR PARATHYROIDECTOMY;  Surgeon: Armandina Gemma, MD;  Location: [facility];  Service: General;  Laterality: Right;  . surgery for,ectopic pregnancy  2011    1 fallopian tube rupture  . TUBAL LIGATION  2011      Allergies:  Allergies  Allergen  Reactions  . Bee Venom Anaphylaxis  . Contrast Media [Iodinated Diagnostic Agents] Anaphylaxis  . Eggs Or Egg-Derived Products Anaphylaxis  . Penicillins Hives and Other (See Comments)    Has patient had a PCN reaction causing immediate rash, facial/tongue/throat swelling, SOB or lightheadedness with hypotension: No Has patient had a PCN reaction causing severe rash involving mucus membranes or skin necrosis: No Has patient had a PCN reaction that required hospitalization No Has patient had a PCN reaction occurring within the last 10 years: No If all of the above answers are "NO", then may proceed with Cephalosporin use.  . Latex Rash  . Oxycodone-Acetaminophen Itching    Family History: Family History  Problem Relation Age of Onset  . Bell's palsy Mother   . Heart failure Mother   . Asthma Father   . Hypertension Father     Social History:  reports that  has never smoked. she has never used smokeless tobacco. She reports that she drinks alcohol. She reports that she does not use drugs.  ROS: 12 point ROS negative except for above  Physical Exam: BP 118/62 (BP Location: Left Arm)   Pulse 80   Temp 97.6 F (36.4 C) (Oral)   Resp 18   Ht 5' 5.98" (1.676 m)   Wt (!) 312 lb (141.5 kg)   SpO2 98%   BMI 50.38 kg/m   Constitutional:  Alert and oriented, No acute distress. HEENT: [place] AT, moist mucus membranes.  Trachea midline, no masses. Cardiovascular: No clubbing, cyanosis, or edema. Respiratory: Normal respiratory effort, no increased work of breathing. GI: Abdomen is soft, nontender, nondistended, no abdominal masses GU: Left CVA tenderness.  Skin: No rashes, bruises or suspicious lesions. Lymph: No cervical or inguinal adenopathy. Neurologic: Grossly intact, no focal deficits, moving all 4 extremities. Psychiatric: Normal mood and affect.  Laboratory Data: Lab Results  Component Value Date   WBC 5.2 03/01/2018   HGB 11.6 (L) 03/01/2018   HCT 36.7 03/01/2018   MCV  84.6 03/01/2018   PLT 272 03/01/2018    Lab Results  Component Value Date   CREATININE 0.86 03/01/2018    No results found for: PSA  No results found for: TESTOSTERONE  No results found for: HGBA1C  Urinalysis    Component Value Date/Time   COLORURINE YELLOW (A) 02/28/2018 2009   APPEARANCEUR HAZY (A) 02/28/2018 2009   LABSPEC 1.012 02/28/2018 2009   PHURINE 7.0 02/28/2018 2009   GLUCOSEU NEGATIVE 02/28/2018 2009   HGBUR SMALL (A) 02/28/2018 2009   BILIRUBINUR NEGATIVE 02/28/2018 2009   KETONESUR NEGATIVE 02/28/2018 2009   PROTEINUR NEGATIVE 02/28/2018 2009   UROBILINOGEN 1.0 10/23/2015 0556   NITRITE NEGATIVE 02/28/2018 2009   LEUKOCYTESUR TRACE (A) 02/28/2018 2009    Pertinent Imaging: CT and KUB reviewed as above.  There is a 1.2 cm left UPJ stone and a 1 cm left lower pole stone.  Assessment & Plan:    1.  Left 1.2 cm UPJ stone 2.  1 cm left lower pole stone 3.  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  At this time, she is still requiring IV pain medication for her stone at the left UPJ that is unlikely to pass due to its size.  We discussed cystoscopy with left retrograde pyelogram and left ureteral stent placement urgently for pain control.  We did discuss that due to her large stone burden and the urgent add-on nature of this procedure that she would need a staged procedure with future ureteroscopy to address her stone burden.  She is agreeable to this approach.  My partner, Dr. Bernardo Heater, has OR availability later this morning to perform this procedure.  All risks and benefits of the procedure were discussed in great detail.  The patient has consented to the procedure.  She should be stable for discharge home later this afternoon after undergoing left ureteral stent placement.  Nickie Retort, MD

## 2018-03-01 NOTE — Interval H&P Note (Signed)
History and Physical Interval Note:  03/01/2018 11:20 AM  Carol Ortega  has presented today for surgery, with the diagnosis of fff  The various methods of treatment have been discussed with the patient and family. After consideration of risks, benefits and other options for treatment, the patient has consented to  Procedure(s): CYSTOSCOPY WITH RETROGRADE PYELOGRAM (Left) CYSTOSCOPY WITH STENT PLACEMENT (Left) as a surgical intervention .  The patient's history has been reviewed, patient examined, no change in status, stable for surgery.  I have reviewed the patient's chart and labs.  Questions were answered to the patient's satisfaction.     [place]

## 2018-03-01 NOTE — Consult Note (Signed)
03/01/2018 8:25 AM   Carol Ortega April 13, 1971 [id number]  Referring provider: Dr. 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  She also had a 1 cm left lower pole calculus.  These were both seen on her initial CT scan as well as her follow-up KUB last night.  At this point, she has no signs of sepsis or infection but her pain is intractable.  She is still requiring IV pain medication.  She has no fevers or chills.  She still reports though severe left flank pain.  She does have a significant history of parathyroidism resulting in recurrent nephrolithiasis.  She has had at least 7 procedures for her stones.  They have mostly been treated via ureteroscopy.  She did undergo lithotripsy at one point for a 1.1 cm stone that resulted in Steinstrasse that then required ureteroscopy.  Despite parathyroidism being the apparent cause of her recurrent nephrolithiasis, she has developed 3 stones since having parathyroid surgery.     PMH: Past Medical History:  Diagnosis Date  . Anemia   . Anxiety   . Asthma    seasonal  . Brain tumor ([place]) 2014   tx with radiation  . Cancer ([place])   . Complication of anesthesia 1999   lung collapse after surgery for gallbladder  . Depression   . Headache    migraines  . Hoarseness of voice   . Hypertension   . Kidney stone   . Obesity   . Parathyroid abnormality ([place])   . Pulmonary emboli ([place]) 2011  . Radiation    Brain tumor  . UTI (urinary tract infection)   . Vitamin D deficiency     Surgical History: Past Surgical History:  Procedure Laterality Date  . CHOLECYSTECTOMY  1999  . CYSTOSCOPY W/ URETERAL STENT PLACEMENT Right 04/08/2017   Procedure: CYSTOSCOPY WITH RETROGRADE PYELOGRAM/URETERAL STENT PLACEMENT;  Surgeon: Rana Snare,  MD;  [facility];  Service: Urology;  Laterality: Right;  . CYSTOSCOPY W/ URETEROSCOPY    . CYSTOSCOPY WITH RETROGRADE PYELOGRAM, URETEROSCOPY AND STENT PLACEMENT Left 01/20/2014   Procedure: LEFT URETEROSCOPY WITH STENT PLACEMENT;  Surgeon: Irine Seal, MD;  [facility];  Service: Urology;  Laterality: Left;  . CYSTOSCOPY WITH RETROGRADE PYELOGRAM, URETEROSCOPY AND STENT PLACEMENT Bilateral 04/23/2015   Procedure: CYSTOSCOPY WITH BILATERAL RETROGRADE PYELOGRAM, URETEROSCOPY AND STENT PLACEMENT;  Surgeon: Alexis Frock, MD;  [facility];  Service: Urology;  Laterality: Bilateral;  . CYSTOSCOPY WITH RETROGRADE PYELOGRAM, URETEROSCOPY AND STENT PLACEMENT Right 04/13/2017   Procedure: CYSTOSCOPY WITH RETROGRADE PYELOGRAM, URETEROSCOPY AND STENT EXCHANGE;  Surgeon: Alexis Frock, MD;  [facility];  Service: Urology;  Laterality: Right;  . CYSTOSCOPY WITH STENT PLACEMENT Left 01/12/2014   Procedure: CYSTOSCOPY WITH STENT PLACEMENT left retrograde;  Surgeon: Irine Seal, MD;  [facility];  Service: Urology;  Laterality: Left;  . HOLMIUM LASER APPLICATION Left 02/17/8249   Procedure: HOLMIUM LASER APPLICATION;  Surgeon: Irine Seal, MD;  [facility];  Service: Urology;  Laterality: Left;  . HOLMIUM LASER APPLICATION Bilateral 04/19/9766   Procedure: HOLMIUM LASER APPLICATION;  Surgeon: Alexis Frock, MD;  [facility];  Service: Urology;  Laterality: Bilateral;  . HOLMIUM LASER APPLICATION Right 3/41/9379   Procedure: HOLMIUM LASER APPLICATION;  Surgeon: Alexis Frock, MD;  [facility];  Service: Urology;  Laterality: Right;  . kidney stone removal    . LITHOTRIPSY    . PARATHYROIDECTOMY Right 11/13/2016   Procedure: RIGHT SUPERIOR PARATHYROIDECTOMY;  Surgeon: Armandina Gemma, MD;  Location: [facility];  Service: General;  Laterality: Right;  . surgery for,ectopic pregnancy  2011    1 fallopian tube rupture  . TUBAL LIGATION  2011      Allergies:  Allergies  Allergen  Reactions  . Bee Venom Anaphylaxis  . Contrast Media [Iodinated Diagnostic Agents] Anaphylaxis  . Eggs Or Egg-Derived Products Anaphylaxis  . Penicillins Hives and Other (See Comments)    Has patient had a PCN reaction causing immediate rash, facial/tongue/throat swelling, SOB or lightheadedness with hypotension: No Has patient had a PCN reaction causing severe rash involving mucus membranes or skin necrosis: No Has patient had a PCN reaction that required hospitalization No Has patient had a PCN reaction occurring within the last 10 years: No If all of the above answers are "NO", then may proceed with Cephalosporin use.  . Latex Rash  . Oxycodone-Acetaminophen Itching    Family History: Family History  Problem Relation Age of Onset  . Bell's palsy Mother   . Heart failure Mother   . Asthma Father   . Hypertension Father     Social History:  reports that  has never smoked. she has never used smokeless tobacco. She reports that she drinks alcohol. She reports that she does not use drugs.  ROS: 12 point ROS negative except for above  Physical Exam: BP 118/62 (BP Location: Left Arm)   Pulse 80   Temp 97.6 F (36.4 C) (Oral)   Resp 18   Ht 5' 5.98" (1.676 m)   Wt (!) 312 lb (141.5 kg)   SpO2 98%   BMI 50.38 kg/m   Constitutional:  Alert and oriented, No acute distress. HEENT: [place] AT, moist mucus membranes.  Trachea midline, no masses. Cardiovascular: No clubbing, cyanosis, or edema. Respiratory: Normal respiratory effort, no increased work of breathing. GI: Abdomen is soft, nontender, nondistended, no abdominal masses GU: Left CVA tenderness.  Skin: No rashes, bruises or suspicious lesions. Lymph: No cervical or inguinal adenopathy. Neurologic: Grossly intact, no focal deficits, moving all 4 extremities. Psychiatric: Normal mood and affect.  Laboratory Data: Lab Results  Component Value Date   WBC 5.2 03/01/2018   HGB 11.6 (L) 03/01/2018   HCT 36.7 03/01/2018   MCV  84.6 03/01/2018   PLT 272 03/01/2018    Lab Results  Component Value Date   CREATININE 0.86 03/01/2018    No results found for: PSA  No results found for: TESTOSTERONE  No results found for: HGBA1C  Urinalysis    Component Value Date/Time   COLORURINE YELLOW (A) 02/28/2018 2009   APPEARANCEUR HAZY (A) 02/28/2018 2009   LABSPEC 1.012 02/28/2018 2009   PHURINE 7.0 02/28/2018 2009   GLUCOSEU NEGATIVE 02/28/2018 2009   HGBUR SMALL (A) 02/28/2018 2009   BILIRUBINUR NEGATIVE 02/28/2018 2009   KETONESUR NEGATIVE 02/28/2018 2009   PROTEINUR NEGATIVE 02/28/2018 2009   UROBILINOGEN 1.0 10/23/2015 0556   NITRITE NEGATIVE 02/28/2018 2009   LEUKOCYTESUR TRACE (A) 02/28/2018 2009    Pertinent Imaging: CT and KUB reviewed as above.  There is a 1.2 cm left UPJ stone and a 1 cm left lower pole stone.  Assessment & Plan:    1.  Left 1.2 cm UPJ stone 2.  1 cm left lower pole stone 3.  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  At this time, she is still requiring IV pain medication for her stone at the left UPJ that is unlikely to pass due to its size.  We discussed cystoscopy with left retrograde pyelogram and left ureteral stent placement urgently for pain control.  We did discuss that due to her large stone burden and the urgent add-on nature of this procedure that she would need a staged procedure with future ureteroscopy to address her stone burden.  She is agreeable to this approach.  My partner, Dr. Bernardo Heater, has OR availability later this morning to perform this procedure.  All risks and benefits of the procedure were discussed in great detail.  The patient has consented to the procedure.  She should be stable for discharge home later this afternoon after undergoing left ureteral stent placement.  Nickie Retort, MD

## 2018-03-01 NOTE — Plan of Care (Signed)
Pt continues to have pain post stent placement. Tolerated regular diet

## 2018-03-02 ENCOUNTER — Inpatient Hospital Stay: Payer: Medicaid Other

## 2018-03-02 DIAGNOSIS — R1032 Left lower quadrant pain: Secondary | ICD-10-CM

## 2018-03-02 DIAGNOSIS — N132 Hydronephrosis with renal and ureteral calculous obstruction: Secondary | ICD-10-CM

## 2018-03-02 DIAGNOSIS — R0602 Shortness of breath: Secondary | ICD-10-CM

## 2018-03-02 LAB — URINE CULTURE

## 2018-03-02 MED ORDER — AZITHROMYCIN 250 MG PO TABS
250.0000 mg | ORAL_TABLET | Freq: Every day | ORAL | Status: DC
  Administered 2018-03-03 – 2018-03-04 (×2): 250 mg via ORAL
  Filled 2018-03-02 (×2): qty 1

## 2018-03-02 MED ORDER — BUDESONIDE 0.5 MG/2ML IN SUSP
0.5000 mg | Freq: Two times a day (BID) | RESPIRATORY_TRACT | Status: DC
  Administered 2018-03-02 – 2018-03-04 (×4): 0.5 mg via RESPIRATORY_TRACT
  Filled 2018-03-02 (×5): qty 2

## 2018-03-02 MED ORDER — METHYLPREDNISOLONE SODIUM SUCC 40 MG IJ SOLR
40.0000 mg | Freq: Every day | INTRAMUSCULAR | Status: DC
  Administered 2018-03-02 – 2018-03-04 (×3): 40 mg via INTRAVENOUS
  Filled 2018-03-02 (×3): qty 1

## 2018-03-02 MED ORDER — IPRATROPIUM-ALBUTEROL 0.5-2.5 (3) MG/3ML IN SOLN
3.0000 mL | Freq: Four times a day (QID) | RESPIRATORY_TRACT | Status: DC
  Administered 2018-03-02 – 2018-03-04 (×7): 3 mL via RESPIRATORY_TRACT
  Filled 2018-03-02 (×8): qty 3

## 2018-03-02 MED ORDER — AZITHROMYCIN 250 MG PO TABS
500.0000 mg | ORAL_TABLET | Freq: Every day | ORAL | Status: AC
Start: 2018-03-02 — End: 2018-03-02
  Administered 2018-03-02: 500 mg via ORAL
  Filled 2018-03-02: qty 2

## 2018-03-02 MED ORDER — SODIUM CHLORIDE 0.9% FLUSH: 10.0000 mL | INTRAVENOUS | Status: DC | PRN

## 2018-03-02 MED ORDER — PHENAZOPYRIDINE HCL 200 MG PO TABS
200.0000 mg | ORAL_TABLET | Freq: Three times a day (TID) | ORAL | Status: DC | PRN
  Administered 2018-03-02 – 2018-03-03 (×3): 200 mg via ORAL
  Filled 2018-03-02 (×4): qty 1

## 2018-03-02 MED ORDER — SODIUM CHLORIDE 0.9 % IV SOLN
2.0000 g | Freq: Three times a day (TID) | INTRAVENOUS | Status: DC
  Administered 2018-03-02 – 2018-03-04 (×6): 2 g via INTRAVENOUS
  Filled 2018-03-02 (×8): qty 2

## 2018-03-02 NOTE — Progress Notes (Signed)
Patient ID: Carol Ortega, female   DOB: Oct 09, 1971, 47 y.o.   MRN: [id number]  [facility] PROGRESS NOTE  Nolah Krenzer GHW:[id number] DOB: 04/29/1971 DOA: 02/28/2018 PCP: Berkley Harvey, NP  HPI/Subjective: Patient had hypoxia after the procedure.  Having trouble breathing overnight.  She thinks her asthma has been acting up.  She has been coughing up clots with blood.  She and cramping in her lower abdomen.  Also having some spotting.  She has a history of recent parathyroid surgery and history of kidney stones in the past.  Objective: Vitals:   03/02/18 0544 03/02/18 0557  BP:  (!) 114/56  Pulse:  85  Resp:  20  Temp:  97.7 F (36.5 C)  SpO2: 95% 96%    Filed Weights   02/28/18 0035  Weight: (!) 141.5 kg (312 lb)    ROS: Review of Systems  Constitutional: Negative for chills and fever.  Eyes: Negative for blurred vision.  Respiratory: Positive for cough, hemoptysis, shortness of breath and wheezing.   Cardiovascular: Negative for chest pain.  Gastrointestinal: Positive for abdominal pain. Negative for constipation, diarrhea, nausea and vomiting.  Genitourinary: Negative for dysuria.  Musculoskeletal: Negative for joint pain.  Neurological: Negative for dizziness and headaches.   Exam: Physical Exam  Constitutional: She is oriented to person, place, and time.  HENT:  Nose: No mucosal edema.  Mouth/Throat: No oropharyngeal exudate or posterior oropharyngeal edema.  Eyes: Conjunctivae, EOM and lids are normal. Pupils are equal, round, and reactive to light.  Neck: No JVD present. Carotid bruit is not present. No edema present. No thyroid mass and no thyromegaly present.  Cardiovascular: S1 normal and S2 normal. Exam reveals no gallop.  No murmur heard. Pulses:      Dorsalis pedis pulses are 2+ on the right side, and 2+ on the left side.  Respiratory: No respiratory distress. She has decreased breath sounds in the right middle field, the right lower field, the  left middle field and the left lower field. She has wheezes in the right middle field, the right lower field, the left middle field and the left lower field. She has no rhonchi. She has no rales.  GI: Soft. Bowel sounds are normal. There is no tenderness.  Musculoskeletal:       Right ankle: She exhibits swelling.       Left ankle: She exhibits swelling.  Lymphadenopathy:    She has no cervical adenopathy.  Neurological: She is alert and oriented to person, place, and time. No cranial nerve deficit.  Skin: Skin is warm. No rash noted. Nails show no clubbing.  Psychiatric: She has a normal mood and affect.      Data Reviewed: Basic Metabolic Panel: Recent Labs  Lab 02/25/18 1802 02/28/18 2009 03/01/18 0646  NA 135 140 142  K 4.1 3.7 3.8  CL 101 105 106  CO2 23 26 28   GLUCOSE 111* 118* 95  BUN 12 8 8   CREATININE 0.94 0.89 0.86  CALCIUM 8.8* 8.9 8.5*   Liver Function Tests: Recent Labs  Lab 02/25/18 1802  AST 22  ALT 11*  ALKPHOS 70  BILITOT 0.8  PROT 8.0  ALBUMIN 3.9   No results for input(s): LIPASE, AMYLASE in the last 168 hours. No results for input(s): AMMONIA in the last 168 hours. CBC: Recent Labs  Lab 02/28/18 2009 03/01/18 0646  WBC 6.4 5.2  NEUTROABS 4.3  --   HGB 12.8 11.6*  HCT 40.3 36.7  MCV 83.9 84.6  PLT  272 272    Studies: Dg Abdomen 1 View  Result Date: 02/28/2018 CLINICAL DATA:  Acute onset of left flank pain, radiating to the abdomen. EXAM: ABDOMEN - 1 VIEW COMPARISON:  CT of the abdomen and pelvis performed 02/25/2018 FINDINGS: The visualized bowel gas pattern is unremarkable. Scattered air and stool filled loops of colon are seen; no abnormal dilatation of small bowel loops is seen to suggest small bowel obstruction. No free intra-abdominal air is identified, though evaluation for free air is limited on a single supine view. The large 1.2 cm obstructing stone is again noted at the left ureteropelvic junction, just below the left renal  pelvis. A nonobstructing stone is again noted at the lower pole of the left kidney. The visualized osseous structures are within normal limits; the sacroiliac joints are unremarkable in appearance. The visualized lung bases are essentially clear. Clips are noted within the right upper quadrant, reflecting prior cholecystectomy. IMPRESSION: 1. 1.2 cm obstructing stone again noted at the left ureteropelvic junction, just below the left renal pelvis, grossly unchanged in appearance. This is unlikely to pass on its own, given its size. 2. Nonobstructing stone again noted at the lower pole of the left kidney. Electronically Signed   By: Garald Balding M.D.   On: 02/28/2018 23:06    Scheduled Meds: . budesonide (PULMICORT) nebulizer solution  0.5 mg Nebulization BID  . docusate sodium  100 mg Oral BID  . ipratropium-albuterol  3 mL Nebulization Q6H  . methylPREDNISolone (SOLU-MEDROL) injection  40 mg Intravenous Daily  . pantoprazole  40 mg Oral Daily  . tamsulosin  0.4 mg Oral Daily   Continuous Infusions:  Assessment/Plan:  1. Asthma exacerbation, acute hypoxic respiratory failure.  Start Solu-Medrol, budesonide and DuoNeb nebulizer solution.  Continue oxygen supplementation.  Try to get off oxygen prior to disposition.  Obtain a chest x-ray.  Small amount of hemoptysis stop heparin subcu. 2. Nephrolithiasis.  Patient is status post procedure by Dr. Bernardo Heater yesterday.  On Flomax.  Discontinue IV fluids. 3. Essential hypertension.  Hold lisinopril HCTZ this morning 4. GERD on Protonix 5. Morbid obesity weight loss needed  Code Status:     Code Status Orders  (From admission, onward)        Start     Ordered   02/28/18 2345  Full code  Continuous     02/28/18 2344    Code Status History    Date Active Date Inactive Code Status Order ID Comments User Context   04/09/2017 01:24 04/09/2017 18:46 Full Code [id number]  Rana Snare, MD Inpatient     Disposition Plan:  evaluate daily on when  to go home  Consultants:  Urology  Procedures:  Urological stent  Time spent: 25 minutes  [place]

## 2018-03-02 NOTE — Progress Notes (Signed)
1 Day Post-Op ladder pain with the stent.   She is complaining of SOB with walking and a CXR today suggests pneumonia.  Antibiotics have been initiated by Dr. Earleen Newport.   ROS: Subjective: Carol Ortega is POD#1 from left ureteral stent insertion for a 33mm LUPJ stone.   She has reduced flank pain but now has frequency, urgency and b  Review of Systems  Constitutional: Negative for chills and fever.  Respiratory: Positive for shortness of breath.   Genitourinary: Positive for frequency and urgency.    Anti-infectives: Anti-infectives (From admission, onward)   Start     Dose/Rate Route Frequency Ordered Stop   03/03/18 1000  azithromycin (ZITHROMAX) tablet 250 mg     250 mg Oral Daily 03/02/18 1136 03/07/18 0959   03/02/18 1300  ceFEPIme (MAXIPIME) 2 g in sodium chloride 0.9 % 100 mL IVPB     2 g 200 mL/hr over 30 Minutes Intravenous Every 8 hours 03/02/18 1201     03/02/18 1145  azithromycin (ZITHROMAX) tablet 500 mg     500 mg Oral Daily 03/02/18 1136 03/02/18 1157   03/01/18 1107  ciprofloxacin (CIPRO) 400 MG/200ML IVPB    Comments:  Phineas Real   : cabinet override      03/01/18 1107 03/01/18 2329   02/28/18 2300  cefTRIAXone (ROCEPHIN) 1 g in sodium chloride 0.9 % 100 mL IVPB  Status:  Discontinued     1 g 200 mL/hr over 30 Minutes Intravenous Every 24 hours 02/28/18 2257 03/01/18 1343      Current Facility-Administered Medications  Medication Dose Route Frequency Provider Last Rate Last Dose  . acetaminophen (TYLENOL) tablet 650 mg  650 mg Oral Q6H PRN Salary, Montell D, MD       Or  . acetaminophen (TYLENOL) suppository 650 mg  650 mg Rectal Q6H PRN Salary, Avel Peace, MD      . Derrill Memo ON 03/03/2018] azithromycin (ZITHROMAX) tablet 250 mg  250 mg Oral Daily Wieting, Richard, MD      . bisacodyl (DULCOLAX) EC tablet 5 mg  5 mg Oral Daily PRN Salary, Montell D, MD      . budesonide (PULMICORT) nebulizer solution 0.5 mg  0.5 mg Nebulization BID Wieting, Richard, MD      . ceFEPIme  (MAXIPIME) 2 g in sodium chloride 0.9 % 100 mL IVPB  2 g Intravenous Q8H Shuder, [place], RPH      . docusate sodium (COLACE) capsule 100 mg  100 mg Oral BID Loney Hering D, MD   100 mg at 03/02/18 0908  . hydrALAZINE (APRESOLINE) injection 10 mg  10 mg Intravenous Q4H PRN Salary, Montell D, MD      . HYDROcodone-acetaminophen (NORCO/VICODIN) 5-325 MG per tablet 1 tablet  1 tablet Oral Q4H PRN Salary, Avel Peace, MD   1 tablet at 03/02/18 0630  . ipratropium-albuterol (DUONEB) 0.5-2.5 (3) MG/3ML nebulizer solution 3 mL  3 mL Nebulization Q6H Wieting, Richard, MD      . methylPREDNISolone sodium succinate (SOLU-MEDROL) 40 mg/mL injection 40 mg  40 mg Intravenous Daily Loletha Grayer, MD   40 mg at 03/02/18 0908  . morphine 4 MG/ML injection 4 mg  4 mg Intravenous Q3H PRN Merlyn Lot, MD   4 mg at 03/02/18 0908  . morphine 4 MG/ML injection 4 mg  4 mg Intravenous Q2H PRN Salary, Montell D, MD   4 mg at 03/01/18 2304  . ondansetron (ZOFRAN) tablet 4 mg  4 mg Oral Q6H PRN Salary, Avel Peace, MD  Or  . ondansetron (ZOFRAN) injection 4 mg  4 mg Intravenous Q6H PRN Salary, Montell D, MD   4 mg at 03/01/18 1145  . oxybutynin (DITROPAN) tablet 5 mg  5 mg Oral TID PRN Stoioff, Scott C, MD      . pantoprazole (PROTONIX) EC tablet 40 mg  40 mg Oral Daily Salary, Montell D, MD   40 mg at 03/02/18 0908  . phenazopyridine (PYRIDIUM) tablet 200 mg  200 mg Oral TID WC PRN Carol Seal, MD      . polyethylene glycol (MIRALAX / GLYCOLAX) packet 17 g  17 g Oral Daily PRN Salary, Montell D, MD      . promethazine (PHENERGAN) injection 12.5 mg  12.5 mg Intravenous Q6H PRN Merlyn Lot, MD   12.5 mg at 02/28/18 2058  . tamsulosin (FLOMAX) capsule 0.4 mg  0.4 mg Oral Daily Salary, Montell D, MD   0.4 mg at 03/02/18 0908     Objective: Vital signs in last 24 hours: Temp:  [97.4 F (36.3 C)-98.3 F (36.8 C)] 97.4 F (36.3 C) (03/16 1147) Pulse Rate:  [74-100] 74 (03/16 1147) Resp:  [14-20] 15  (03/16 1147) BP: (101-142)/(52-87) 142/87 (03/16 1147) SpO2:  [91 %-97 %] 96 % (03/16 1147)  Intake/Output from previous day: 03/15 0701 - 03/16 0700 In: 1750 [P.O.:600; I.V.:1150] Out: 2100 [Urine:2100] Intake/Output this shift: Total I/O In: 360 [P.O.:360] Out: -    Physical Exam  Constitutional: No distress.  Obese, WD in NAD  Psychiatric: Affect normal.  Vitals reviewed.   Lab Results:  Recent Labs    02/28/18 2009 03/01/18 0646  WBC 6.4 5.2  HGB 12.8 11.6*  HCT 40.3 36.7  PLT 272 272   BMET Recent Labs    02/28/18 2009 03/01/18 0646  NA 140 142  K 3.7 3.8  CL 105 106  CO2 26 28  GLUCOSE 118* 95  BUN 8 8  CREATININE 0.89 0.86  CALCIUM 8.9 8.5*   PT/INR No results for input(s): LABPROT, INR in the last 72 hours. ABG No results for input(s): PHART, HCO3 in the last 72 hours.  Invalid input(s): PCO2, PO2  Studies/Results: Dg Chest 2 View  Result Date: 03/02/2018 CLINICAL DATA:  Short of breath today EXAM: CHEST - 2 VIEW COMPARISON:  04/26/2017 FINDINGS: Patchy perihilar airspace opacities bilaterally. Low volumes. Normal heart size. No pneumothorax or pleural effusion. IMPRESSION: Patchy bilateral perihilar pneumonia. Followup PA and lateral chest X-ray is recommended in 3-4 weeks following trial of antibiotic therapy to ensure resolution and exclude underlying malignancy. Electronically Signed   By: Marybelle Killings M.D.   On: 03/02/2018 10:18   Dg Abdomen 1 View  Result Date: 02/28/2018 CLINICAL DATA:  Acute onset of left flank pain, radiating to the abdomen. EXAM: ABDOMEN - 1 VIEW COMPARISON:  CT of the abdomen and pelvis performed 02/25/2018 FINDINGS: The visualized bowel gas pattern is unremarkable. Scattered air and stool filled loops of colon are seen; no abnormal dilatation of small bowel loops is seen to suggest small bowel obstruction. No free intra-abdominal air is identified, though evaluation for free air is limited on a single supine view. The  large 1.2 cm obstructing stone is again noted at the left ureteropelvic junction, just below the left renal pelvis. A nonobstructing stone is again noted at the lower pole of the left kidney. The visualized osseous structures are within normal limits; the sacroiliac joints are unremarkable in appearance. The visualized lung bases are essentially clear. Clips are noted within  the right upper quadrant, reflecting prior cholecystectomy. IMPRESSION: 1. 1.2 cm obstructing stone again noted at the left ureteropelvic junction, just below the left renal pelvis, grossly unchanged in appearance. This is unlikely to pass on its own, given its size. 2. Nonobstructing stone again noted at the lower pole of the left kidney. Electronically Signed   By: Garald Balding M.D.   On: 02/28/2018 23:06    Labs and CXR results reviewed.  Case discussed with Dr. Leslye Peer.   Assessment and Plan: Left UPJ stone s/p left ureteral stent insertion.   I will add pyridium for bladder irritation.  SOB with possible pneumonia.  Management per med svc.       LOS: 2 days    Carol Ortega 03/02/2018 [phone] ID: Carol Ortega, female   DOB: Jun 03, 1971, 47 y.o.   MRN: [id number]

## 2018-03-02 NOTE — Discharge Instructions (Addendum)

## 2018-03-02 NOTE — Progress Notes (Signed)
Pharmacy Antibiotic Note  Carol Ortega is a 47 y.o. female admitted on 02/28/2018 with (HCAP) pneumonia.  Pharmacy has been consulted for cefepime dosing.   Pharmacy initially consulted for Zosyn dosing, but patient has allergy to PCN (hives). Spoke with Dr. Leslye Peer and agreed to start patient on cefepime.   Plan: Will start cefepime 2g IV q8h   Height: 5' 5.98" (167.6 cm) Weight: (!) 312 lb (141.5 kg) IBW/kg (Calculated) : 59.26  Temp (24hrs), Avg:97.8 F (36.6 C), Min:97 F (36.1 C), Max:98.3 F (36.8 C)  Recent Labs  Lab 02/25/18 1802 02/28/18 2009 03/01/18 0646  WBC  --  6.4 5.2  CREATININE 0.94 0.89 0.86    Estimated Creatinine Clearance: 119 mL/min (by C-G formula based on SCr of 0.86 mg/dL).    Allergies  Allergen Reactions  . Bee Venom Anaphylaxis  . Contrast Media [Iodinated Diagnostic Agents] Anaphylaxis  . Eggs Or Egg-Derived Products Anaphylaxis  . Penicillins Hives and Other (See Comments)    Has patient had a PCN reaction causing immediate rash, facial/tongue/throat swelling, SOB or lightheadedness with hypotension: No Has patient had a PCN reaction causing severe rash involving mucus membranes or skin necrosis: No Has patient had a PCN reaction that required hospitalization No Has patient had a PCN reaction occurring within the last 10 years: No If all of the above answers are "NO", then may proceed with Cephalosporin use.  . Latex Rash  . Oxycodone-Acetaminophen Itching    Antimicrobials this admission: 3/16 Azithromycin >>  3/16 Cefepime >>   Dose adjustments this admission:   Microbiology results: 3/16 UCx: Insignificant growth   Thank you for allowing pharmacy to be a part of this patient's care.  Candelaria Stagers, PharmD Pharmacy Resident  03/02/2018 12:01 PM

## 2018-03-02 NOTE — Plan of Care (Signed)
Pt is still on 2L of oxygen and requiring IV pain meds

## 2018-03-02 NOTE — Progress Notes (Signed)
Patient ID: Carol Ortega, female   DOB: [birth year], 47 y.o.   MRN: [id number]  Chest x-ray showing pneumonia and antibiotics were started  Dr. Loletha Grayer

## 2018-03-03 NOTE — Progress Notes (Deleted)
03/04/2018 7:39 PM   Carol Ortega [birth year] [id number]  Referring provider: Berkley Harvey, NP [place], [place] [address]  No chief complaint on file.   HPI: Patient is a 47 year old African American female who is referred by [facility] for nephrolithiasis.  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  She also had a 1 cm left lower pole calculus.  These were both seen on her initial CT scan as well as her follow-up KUB last night.  At this point, she has no signs of sepsis or infection but her pain is intractable.  She is still requiring IV pain medication.  She has no fevers or chills.  She still reports though severe left flank pain. She does have a significant history of parathyroidism resulting in recurrent nephrolithiasis.  She has had at least 7 procedures for her stones.  They have mostly been treated via ureteroscopy.  She did undergo lithotripsy at one point for a 1.1 cm stone that resulted in Steinstrasse that then required ureteroscopy.  Despite parathyroidism being the apparent cause of her recurrent nephrolithiasis, she has developed 3 stones since having parathyroid surgery.    She underwent cystoscopy, left ureteral stent placement and left retrograde pyelography with interpretation with Dr. Bernardo Heater on 03/01/2018.  Intraoperative findings were a normal caliber distal, mid and lower proximal ureter.  9 mm calculus noted in the upper proximal ureter with mild ureteral dilation and left hydronephrosis.  Patient expeienced hypoxia after the procedure and spent *** days in the hospitial.    Patient states the onset of the pain was *** hours/days/weeks ago.   It was sharp/dull***.   It lasted for ***.  The pain was located *** and radiated to ***.  The pain was a ***/10.   *** made the pain better.   *** made the pain worse.  She/He did have or did not have gross hematuria, fevers, chills, nausea or vomiting.***  ED ***.  UA ***.  Serum creatinine ***.  Prior serum creatinine ***.  Imaging studies ***  Today, ***.  UA ***.  She/He does or does not have a prior history of stones.       Reviewed referral notes.    PMH: Past Medical History:  Diagnosis Date  . Anemia   . Anxiety   . Asthma    seasonal  . Brain tumor ([place]) 2014   tx with radiation  . Cancer ([place])   . Complication of anesthesia 1999   lung collapse after surgery for gallbladder  . Depression   . Headache    migraines  . Hoarseness of voice   . Hypertension   . Kidney stone   . Obesity   . Parathyroid abnormality ([place])   . Pulmonary emboli ([place]) 2011  . Radiation    Brain tumor  . UTI (urinary tract infection)   . Vitamin D deficiency     Surgical History: Past Surgical History:  Procedure Laterality Date  . CHOLECYSTECTOMY  1999  . CYSTOSCOPY W/ RETROGRADES Left 03/01/2018   Procedure: CYSTOSCOPY WITH RETROGRADE PYELOGRAM;  Surgeon: Abbie Sons, MD;  Location: [facility];  Service: Urology;  Laterality: Left;  . CYSTOSCOPY W/ URETERAL STENT PLACEMENT Right 04/08/2017   Procedure: CYSTOSCOPY WITH RETROGRADE PYELOGRAM/URETERAL STENT PLACEMENT;  Surgeon: Rana Snare,  MD;  Location: [facility];  Service: Urology;  Laterality: Right;  . CYSTOSCOPY W/ URETEROSCOPY    . CYSTOSCOPY WITH RETROGRADE PYELOGRAM, URETEROSCOPY AND STENT PLACEMENT Left 01/20/2014   Procedure: LEFT URETEROSCOPY WITH STENT PLACEMENT;  Surgeon: Irine Seal, MD;  Location: [facility];  Service: Urology;  Laterality: Left;  . CYSTOSCOPY WITH RETROGRADE PYELOGRAM, URETEROSCOPY AND STENT PLACEMENT Bilateral 04/23/2015   Procedure: CYSTOSCOPY WITH BILATERAL RETROGRADE PYELOGRAM, URETEROSCOPY AND STENT PLACEMENT;  Surgeon: Alexis Frock, MD;  Location: [facility];  Service: Urology;  Laterality: Bilateral;  . CYSTOSCOPY  WITH RETROGRADE PYELOGRAM, URETEROSCOPY AND STENT PLACEMENT Right 04/13/2017   Procedure: CYSTOSCOPY WITH RETROGRADE PYELOGRAM, URETEROSCOPY AND STENT EXCHANGE;  Surgeon: Alexis Frock, MD;  Location: [facility];  Service: Urology;  Laterality: Right;  . CYSTOSCOPY WITH STENT PLACEMENT Left 01/12/2014   Procedure: CYSTOSCOPY WITH STENT PLACEMENT left retrograde;  Surgeon: Irine Seal, MD;  Location: [facility];  Service: Urology;  Laterality: Left;  . CYSTOSCOPY WITH STENT PLACEMENT Left 03/01/2018   Procedure: CYSTOSCOPY WITH STENT PLACEMENT;  Surgeon: Abbie Sons, MD;  Location: [facility];  Service: Urology;  Laterality: Left;  . HOLMIUM LASER APPLICATION Left 12/23/1094   Procedure: HOLMIUM LASER APPLICATION;  Surgeon: Irine Seal, MD;  Location: [facility];  Service: Urology;  Laterality: Left;  . HOLMIUM LASER APPLICATION Bilateral 0/03/5408   Procedure: HOLMIUM LASER APPLICATION;  Surgeon: Alexis Frock, MD;  Location: [facility];  Service: Urology;  Laterality: Bilateral;  . HOLMIUM LASER APPLICATION Right 07/28/9146   Procedure: HOLMIUM LASER APPLICATION;  Surgeon: Alexis Frock, MD;  Location: [facility];  Service: Urology;  Laterality: Right;  . kidney stone removal    . LITHOTRIPSY    . PARATHYROIDECTOMY Right 11/13/2016   Procedure: RIGHT SUPERIOR PARATHYROIDECTOMY;  Surgeon: Armandina Gemma, MD;  Location: [facility];  Service: General;  Laterality: Right;  . surgery for,ectopic pregnancy  2011    1 fallopian tube rupture  . TUBAL LIGATION  2011    Home Medications:  Allergies as of 03/04/2018      Reactions   Bee Venom Anaphylaxis   Contrast Media [iodinated Diagnostic Agents] Anaphylaxis   Eggs Or Egg-derived Products Anaphylaxis   Penicillins Hives, Other (See Comments)   Has patient had a PCN reaction causing immediate rash, facial/tongue/throat swelling, SOB or lightheadedness with hypotension: No Has patient had a PCN reaction causing severe rash involving mucus membranes or skin necrosis: No Has  patient had a PCN reaction that required hospitalization No Has patient had a PCN reaction occurring within the last 10 years: No If all of the above answers are "NO", then may proceed with Cephalosporin use.   Latex Rash   Oxycodone-acetaminophen Itching      Medication List    Notice   This visit is during an admission. Changes to the med list made in this visit will be reflected in the After Visit Summary of the admission.     Allergies:  Allergies  Allergen Reactions  . Bee Venom Anaphylaxis  . Contrast Media [Iodinated Diagnostic Agents] Anaphylaxis  . Eggs Or Egg-Derived Products Anaphylaxis  . Penicillins Hives and Other (See Comments)    Has patient had a PCN reaction causing immediate rash, facial/tongue/throat swelling, SOB or lightheadedness with hypotension: No Has patient had a PCN reaction causing severe rash involving mucus membranes or skin necrosis: No Has patient had a PCN reaction that required hospitalization No Has patient had a PCN reaction occurring within the last 10 years: No If all of the  above answers are "NO", then may proceed with Cephalosporin use.  . Latex Rash  . Oxycodone-Acetaminophen Itching    Family History: Family History  Problem Relation Age of Onset  . Bell's palsy Mother   . Heart failure Mother   . Asthma Father   . Hypertension Father     Social History:  reports that  has never smoked. she has never used smokeless tobacco. She reports that she drinks alcohol. She reports that she does not use drugs.  ROS:                                        Physical Exam: There were no vitals taken for this visit.  Constitutional: Well nourished. Alert and oriented, No acute distress. HEENT: [place] AT, moist mucus membranes. Trachea midline, no masses. Cardiovascular: No clubbing, cyanosis, or edema. Respiratory: Normal respiratory effort, no increased work of breathing. GI: Abdomen is soft, non tender, non  distended, no abdominal masses. Liver and spleen not palpable.  No hernias appreciated.  Stool sample for occult testing is not indicated.   GU: No CVA tenderness.  No bladder fullness or masses.  Patient with circumcised/uncircumcised phallus. ***Foreskin easily retracted***  Urethral meatus is patent.  No penile discharge. No penile lesions or rashes. Scrotum without lesions, cysts, rashes and/or edema.  Testicles are located scrotally bilaterally. No masses are appreciated in the testicles. Left and right epididymis are normal. Rectal: Patient with  normal sphincter tone. Anus and perineum without scarring or rashes. No rectal masses are appreciated. Prostate is approximately *** grams, *** nodules are appreciated. Seminal vesicles are normal. Skin: No rashes, bruises or suspicious lesions. Lymph: No cervical or inguinal adenopathy. Neurologic: Grossly intact, no focal deficits, moving all 4 extremities. Psychiatric: Normal mood and affect.  Laboratory Data: Lab Results  Component Value Date   WBC 5.2 03/01/2018   HGB 11.6 (L) 03/01/2018   HCT 36.7 03/01/2018   MCV 84.6 03/01/2018   PLT 272 03/01/2018    Lab Results  Component Value Date   CREATININE 0.86 03/01/2018    No results found for: PSA  No results found for: TESTOSTERONE  No results found for: HGBA1C  Lab Results  Component Value Date   TSH 2.073 10/13/2017    No results found for: CHOL, HDL, CHOLHDL, VLDL, LDLCALC  Lab Results  Component Value Date   AST 22 02/25/2018   Lab Results  Component Value Date   ALT 11 (L) 02/25/2018   No components found for: ALKALINEPHOPHATASE No components found for: BILIRUBINTOTAL  No results found for: ESTRADIOL  Urinalysis    Component Value Date/Time   COLORURINE YELLOW (A) 02/28/2018 2009   APPEARANCEUR HAZY (A) 02/28/2018 2009   LABSPEC 1.012 02/28/2018 2009   PHURINE 7.0 02/28/2018 2009   GLUCOSEU NEGATIVE 02/28/2018 2009   HGBUR SMALL (A) 02/28/2018 2009    BILIRUBINUR NEGATIVE 02/28/2018 2009   KETONESUR NEGATIVE 02/28/2018 2009   PROTEINUR NEGATIVE 02/28/2018 2009   UROBILINOGEN 1.0 10/23/2015 0556   NITRITE NEGATIVE 02/28/2018 2009   LEUKOCYTESUR TRACE (A) 02/28/2018 2009    I have reviewed the labs.   Pertinent Imaging: *** I have independently reviewed the films.    Assessment & Plan:  ***  1. Left/Right ureteral stone***  - stone sent for analysis  2. Left/Right hydronephrosis***  - obtain RUS to ensure the hydronephrosis has resolved.    3. ***  hematuria  - UA today demonstrates ***  - continue to monitor the patient's UA after the treatment/passage of the stone to ensure the hematuria has resolved  - if hematuria persists, we will pursue a hematuria workup with CT Urogram and cystoscopy if appropriate.    No Follow-up on file.  These notes generated with voice recognition software. I apologize for typographical errors.  Zara Council, [facility] [address] [place], [place] [address] [phone]

## 2018-03-03 NOTE — Plan of Care (Signed)
Pt weaned off oxygen. SOB improving. PRN pain medication given once of flank pain. Adequate uo, orange urine as a result of pyridium.

## 2018-03-03 NOTE — Progress Notes (Signed)
Patient ID: Carol Ortega, female   DOB: Sep 08, 1971, 47 y.o.   MRN: [id number]   [facility] PROGRESS NOTE  Carol Ortega MBT:[id number] DOB: 1970/12/31 DOA: 02/28/2018 PCP: Carol Harvey, NP  HPI/Subjective: Patient feeling better.  Still very weak.  Still with some cough and shortness of breath.  Objective: Vitals:   03/03/18 0438 03/03/18 0838  BP: 129/82   Pulse: 73   Resp: 16   Temp: 97.7 F (36.5 C)   SpO2: 98% 95%    Filed Weights   02/28/18 0035  Weight: (!) 141.5 kg (312 lb)    ROS: Review of Systems  Constitutional: Negative for chills and fever.  Eyes: Negative for blurred vision.  Respiratory: Positive for cough, hemoptysis, shortness of breath and wheezing.   Cardiovascular: Negative for chest pain.  Gastrointestinal: Positive for abdominal pain. Negative for constipation, diarrhea, nausea and vomiting.  Genitourinary: Negative for dysuria.  Musculoskeletal: Negative for joint pain.  Neurological: Positive for weakness. Negative for dizziness and headaches.   Exam: Physical Exam  Constitutional: She is oriented to person, place, and time.  HENT:  Nose: No mucosal edema.  Mouth/Throat: No oropharyngeal exudate or posterior oropharyngeal edema.  Eyes: Conjunctivae, EOM and lids are normal. Pupils are equal, round, and reactive to light.  Neck: No JVD present. Carotid bruit is not present. No edema present. No thyroid mass and no thyromegaly present.  Cardiovascular: S1 normal and S2 normal. Exam reveals no gallop.  No murmur heard. Pulses:      Dorsalis pedis pulses are 2+ on the right side, and 2+ on the left side.  Respiratory: No respiratory distress. She has decreased breath sounds in the right lower field and the left lower field. She has no wheezes. She has rhonchi in the right lower field and the left lower field. She has no rales.  GI: Soft. Bowel sounds are normal. There is no tenderness.  Musculoskeletal:       Right ankle: She exhibits  swelling.       Left ankle: She exhibits swelling.  Lymphadenopathy:    She has no cervical adenopathy.  Neurological: She is alert and oriented to person, place, and time. No cranial nerve deficit.  Skin: Skin is warm. No rash noted. Nails show no clubbing.  Psychiatric: She has a normal mood and affect.      Data Reviewed: Basic Metabolic Panel: Recent Labs  Lab 02/25/18 1802 02/28/18 2009 03/01/18 0646  NA 135 140 142  K 4.1 3.7 3.8  CL 101 105 106  CO2 23 26 28   GLUCOSE 111* 118* 95  BUN 12 8 8   CREATININE 0.94 0.89 0.86  CALCIUM 8.8* 8.9 8.5*   Liver Function Tests: Recent Labs  Lab 02/25/18 1802  AST 22  ALT 11*  ALKPHOS 70  BILITOT 0.8  PROT 8.0  ALBUMIN 3.9   CBC: Recent Labs  Lab 02/28/18 2009 03/01/18 0646  WBC 6.4 5.2  NEUTROABS 4.3  --   HGB 12.8 11.6*  HCT 40.3 36.7  MCV 83.9 84.6  PLT 272 272    Studies: Dg Chest 2 View  Result Date: 03/02/2018 CLINICAL DATA:  Short of breath today EXAM: CHEST - 2 VIEW COMPARISON:  04/26/2017 FINDINGS: Patchy perihilar airspace opacities bilaterally. Low volumes. Normal heart size. No pneumothorax or pleural effusion. IMPRESSION: Patchy bilateral perihilar pneumonia. Followup PA and lateral chest X-ray is recommended in 3-4 weeks following trial of antibiotic therapy to ensure resolution and exclude underlying malignancy. Electronically Signed  By: Marybelle Killings M.D.   On: 03/02/2018 10:18    Scheduled Meds: . azithromycin  250 mg Oral Daily  . budesonide (PULMICORT) nebulizer solution  0.5 mg Nebulization BID  . docusate sodium  100 mg Oral BID  . ipratropium-albuterol  3 mL Nebulization Q6H  . methylPREDNISolone (SOLU-MEDROL) injection  40 mg Intravenous Daily  . pantoprazole  40 mg Oral Daily  . tamsulosin  0.4 mg Oral Daily   Continuous Infusions: . ceFEPime (MAXIPIME) IV Stopped (03/03/18 1414)    Assessment/Plan:  1. Pneumonia, asthma exacerbation, acute hypoxic respiratory failure.    continue Solu-Medrol, budesonide and DuoNeb nebulizer solution.  Continue antibiotics. We were able to taper off oxygen supplementation.   evaluate tomorrow for disposition 2. Nephrolithiasis.  Status post stent on 03/01/2018.  On Flomax.  3. Essential hypertension.  Hold lisinopril HCTZ this morning 4. GERD on Protonix 5. Morbid obesity weight loss needed  Code Status:     Code Status Orders  (From admission, onward)        Start     Ordered   02/28/18 2345  Full code  Continuous     02/28/18 2344    Code Status History    Date Active Date Inactive Code Status Order ID Comments User Context   04/09/2017 01:24 04/09/2017 18:46 Full Code [id number]  Rana Snare, MD Inpatient     Disposition Plan: Reevaluate tomorrow morning.  Hopefully patient will be able to go home tomorrow.  Consultants:  Urology  Procedures:  Urological stent  Time spent: 24 minutes  [place]

## 2018-03-03 NOTE — Progress Notes (Signed)
2 Days Post-Op Subjective: Patient reports feeling better.  Still a little shortness of breath when she ambulates.  Objective: Vital signs in last 24 hours: Temp:  [97.7 F (36.5 C)-98 F (36.7 C)] 98 F (36.7 C) (03/17 1500) Pulse Rate:  [73-86] 74 (03/17 1500) Resp:  [16-18] 18 (03/17 1500) BP: (116-129)/(58-82) 128/77 (03/17 1500) SpO2:  [95 %-98 %] 96 % (03/17 1500)  Intake/Output from previous day: 03/16 0701 - 03/17 0700 In: 700 [P.O.:600; IV Piggyback:100] Out: 750 [Urine:750] Intake/Output this shift: Total I/O In: 780 [P.O.:480; IV Piggyback:300] Out: 1400 [Urine:1400]  Physical Exam:  No acute distress Sitting in bed watching TV Abdomen is soft and nontender  Lab Results: Recent Labs    02/28/18 2009 03/01/18 0646  HGB 12.8 11.6*  HCT 40.3 36.7   BMET Recent Labs    02/28/18 2009 03/01/18 0646  NA 140 142  K 3.7 3.8  CL 105 106  CO2 26 28  GLUCOSE 118* 95  BUN 8 8  CREATININE 0.89 0.86  CALCIUM 8.9 8.5*   No results for input(s): LABPT, INR in the last 72 hours. No results for input(s): LABURIN in the last 72 hours. Results for orders placed or performed during the hospital encounter of 02/28/18  Urine Culture     Status: Abnormal   Collection Time: 02/28/18  8:09 PM  Result Value Ref Range Status   Specimen Description   Final    URINE, CATHETERIZED Performed at [facility], [address] [place], [place] [address]    Special Requests   Final    NONE Performed at [facility], [address] [place], [place] [address]    Culture (A)  Final    <10,000 COLONIES/mL INSIGNIFICANT GROWTH Performed at [facility] [address], [place], [place] [address]    Report Status 03/02/2018 FINAL  Final    Studies/Results: Dg Chest 2 View  Result Date: 03/02/2018 CLINICAL DATA:  Short of breath today EXAM: CHEST - 2 VIEW COMPARISON:  04/26/2017 FINDINGS: Patchy perihilar airspace opacities bilaterally. Low  volumes. Normal heart size. No pneumothorax or pleural effusion. IMPRESSION: Patchy bilateral perihilar pneumonia. Followup PA and lateral chest X-ray is recommended in 3-4 weeks following trial of antibiotic therapy to ensure resolution and exclude underlying malignancy. Electronically Signed   By: Marybelle Killings M.D.   On: 03/02/2018 10:18    Assessment/Plan: Left UPJ stone status post left ureteral stent- she also has a 9 mm left lower pole stone.  She knows to follow-up with [facility] urological to schedule ureteroscopy.  We discussed importance of follow-up to remove the stent and the stones.  We will sign off.  Please page urology with any questions, concerns or change in inpatient status.   LOS: 3 days   Festus Aloe 03/03/2018, 6:07 PM

## 2018-03-04 ENCOUNTER — Ambulatory Visit: Payer: Self-pay | Admitting: Urology

## 2018-03-04 MED ORDER — ALBUTEROL SULFATE HFA 108 (90 BASE) MCG/ACT IN AERS: 2.0000 | INHALATION_SPRAY | Freq: Four times a day (QID) | RESPIRATORY_TRACT | 2 refills | Status: DC | PRN

## 2018-03-04 MED ORDER — PHENAZOPYRIDINE HCL 200 MG PO TABS: 200.0000 mg | ORAL_TABLET | Freq: Three times a day (TID) | ORAL | 0 refills | Status: DC | PRN

## 2018-03-04 MED ORDER — OXYBUTYNIN CHLORIDE 5 MG PO TABS: 5.0000 mg | ORAL_TABLET | Freq: Three times a day (TID) | ORAL | 0 refills | Status: DC | PRN

## 2018-03-04 MED ORDER — ALBUTEROL SULFATE HFA 108 (90 BASE) MCG/ACT IN AERS: 2.0000 | INHALATION_SPRAY | Freq: Four times a day (QID) | RESPIRATORY_TRACT | 0 refills | Status: DC | PRN

## 2018-03-04 MED ORDER — TAMSULOSIN HCL 0.4 MG PO CAPS: 0.4000 mg | ORAL_CAPSULE | Freq: Every day | ORAL | 0 refills | Status: DC

## 2018-03-04 MED ORDER — AMOXICILLIN-POT CLAVULANATE 875-125 MG PO TABS: 1.0000 | ORAL_TABLET | Freq: Two times a day (BID) | ORAL | Status: DC

## 2018-03-04 MED ORDER — PREDNISONE 10 MG PO TABS: ORAL_TABLET | ORAL | 0 refills | Status: DC

## 2018-03-04 MED ORDER — ALBUTEROL SULFATE (2.5 MG/3ML) 0.083% IN NEBU: 2.5000 mg | INHALATION_SOLUTION | Freq: Four times a day (QID) | RESPIRATORY_TRACT | 12 refills | Status: AC | PRN

## 2018-03-04 MED ORDER — BECLOMETHASONE DIPROP HFA 80 MCG/ACT IN AERB: 1.0000 | INHALATION_SPRAY | Freq: Two times a day (BID) | RESPIRATORY_TRACT | 0 refills | Status: DC

## 2018-03-04 MED ORDER — AMOXICILLIN-POT CLAVULANATE 875-125 MG PO TABS: 1.0000 | ORAL_TABLET | Freq: Two times a day (BID) | ORAL | 0 refills | Status: DC

## 2018-03-04 MED ORDER — FLUCONAZOLE 150 MG PO TABS: 150.0000 mg | ORAL_TABLET | Freq: Once | ORAL | 0 refills | Status: AC

## 2018-03-04 MED ORDER — ALBUTEROL SULFATE (2.5 MG/3ML) 0.083% IN NEBU: 2.5000 mg | INHALATION_SOLUTION | Freq: Four times a day (QID) | RESPIRATORY_TRACT | 0 refills | Status: DC | PRN

## 2018-03-04 MED ORDER — AZITHROMYCIN 250 MG PO TABS: ORAL_TABLET | ORAL | 0 refills | Status: DC

## 2018-03-04 NOTE — Progress Notes (Signed)
Discharge instructions reviewed with the patient.  IV removed.  Patient sent out via wheelchair to her waiting cab

## 2018-03-04 NOTE — Discharge Summary (Signed)
[place] [facility] [place] NAME: Carol Ortega    MR#:  [id number]  DATE OF BIRTH:  11/19/1971  DATE OF ADMISSION:  02/28/2018 ADMITTING PHYSICIAN: Gorden Harms, MD  DATE OF DISCHARGE: 03/04/2018  2:06 PM  PRIMARY CARE PHYSICIAN: Berkley Harvey, NP    ADMISSION DIAGNOSIS:  Ureterolithiasis [N20.1] Left flank pain [R10.9]  DISCHARGE DIAGNOSIS:  Active Problems:   Ureteral stone with hydronephrosis   SECONDARY DIAGNOSIS:   Past Medical History:  Diagnosis Date  . Anemia   . Anxiety   . Asthma    seasonal  . Brain tumor ([place]) 2014   tx with radiation  . Cancer ([place])   . Complication of anesthesia 1999   lung collapse after surgery for gallbladder  . Depression   . Headache    migraines  . Hoarseness of voice   . Hypertension   . Kidney stone   . Obesity   . Parathyroid abnormality ([place])   . Pulmonary emboli ([place]) 2011  . Radiation    Brain tumor  . UTI (urinary tract infection)   . Vitamin D deficiency     HOSPITAL COURSE:   1.  Postoperative pneumonia possible aspiration, asthma exacerbation with acute hypoxic respiratory failure.  The patient was given IV Solu-Medrol and nebulizer treatments during the hospital course.  Antibiotics cefepime and Zithromax were started.  The patient states that she is able to take Augmentin.  I prescribed inhalers upon going home.  Quick prednisone taper.  Respiratory status improved and the patient is currently off oxygen. 2.  Nephrolithiasis.  Patient is status post stent placement by urology on 03/01/2018 on Flomax, as needed oxybutynin. 3.  Essential hypertension blood pressure stable off lisinopril HCTZ 4.  GERD on Protonix 5.  Morbid obesity.  Weight loss needed  DISCHARGE CONDITIONS:   Satisfactory  CONSULTS OBTAINED:  Treatment Team:  Nickie Retort, MD  DRUG ALLERGIES:   Allergies  Allergen Reactions  . Bee Venom Anaphylaxis  . Contrast Media [Iodinated Diagnostic  Agents] Anaphylaxis  . Eggs Or Egg-Derived Products Anaphylaxis  . Penicillins Hives and Other (See Comments)    Has patient had a PCN reaction causing immediate rash, facial/tongue/throat swelling, SOB or lightheadedness with hypotension: No Has patient had a PCN reaction causing severe rash involving mucus membranes or skin necrosis: No Has patient had a PCN reaction that required hospitalization No Has patient had a PCN reaction occurring within the last 10 years: No If all of the above answers are "NO", then may proceed with Cephalosporin use.  . Latex Rash  . Oxycodone-Acetaminophen Itching    DISCHARGE MEDICATIONS:   Allergies as of 03/04/2018      Reactions   Bee Venom Anaphylaxis   Contrast Media [iodinated Diagnostic Agents] Anaphylaxis   Eggs Or Egg-derived Products Anaphylaxis   Penicillins Hives, Other (See Comments)   Has patient had a PCN reaction causing immediate rash, facial/tongue/throat swelling, SOB or lightheadedness with hypotension: No Has patient had a PCN reaction causing severe rash involving mucus membranes or skin necrosis: No Has patient had a PCN reaction that required hospitalization No Has patient had a PCN reaction occurring within the last 10 years: No If all of the above answers are "NO", then may proceed with Cephalosporin use.   Latex Rash   Oxycodone-acetaminophen Itching      Medication List    STOP taking these medications   lisinopril-hydrochlorothiazide 20-12.5 MG tablet Commonly known as:  PRINZIDE,ZESTORETIC  TAKE these medications   acetaminophen 500 MG tablet Commonly known as:  TYLENOL Take 1,000 mg by mouth every 6 (six) hours as needed for mild pain, moderate pain, fever or headache.   albuterol (2.5 MG/3ML) 0.083% nebulizer solution Commonly known as:  PROVENTIL Take 3 mLs (2.5 mg total) by nebulization every 6 (six) hours as needed for wheezing or shortness of breath. What changed:  You were already taking a medication  with the same name, and this prescription was added. Make sure you understand how and when to take each.   albuterol (2.5 MG/3ML) 0.083% nebulizer solution Commonly known as:  PROVENTIL Inhale 3 mLs (2.5 mg total) into the lungs every 6 (six) hours as needed for wheezing or shortness of breath. What changed:  Another medication with the same name was added. Make sure you understand how and when to take each.   albuterol 108 (90 Base) MCG/ACT inhaler Commonly known as:  PROVENTIL HFA;VENTOLIN HFA Inhale 2 puffs into the lungs every 6 (six) hours as needed for wheezing or shortness of breath. What changed:  You were already taking a medication with the same name, and this prescription was added. Make sure you understand how and when to take each.   albuterol 108 (90 Base) MCG/ACT inhaler Commonly known as:  PROVENTIL HFA;VENTOLIN HFA Inhale 2 puffs into the lungs every 6 (six) hours as needed for wheezing or shortness of breath. What changed:  Another medication with the same name was added. Make sure you understand how and when to take each.   amoxicillin-clavulanate 875-125 MG tablet Commonly known as:  AUGMENTIN Take 1 tablet by mouth every 12 (twelve) hours.   azithromycin 250 MG tablet Commonly known as:  ZITHROMAX One tablet daily for 2 days   beclomethasone 80 MCG/ACT inhaler Commonly known as:  QVAR REDIHALER Inhale 1 puff into the lungs 2 (two) times daily.   fluconazole 150 MG tablet Commonly known as:  DIFLUCAN Take 1 tablet (150 mg total) by mouth once for 1 dose. As needed for yeast infection   HYDROcodone-acetaminophen 5-325 MG tablet Commonly known as:  NORCO Take 1 tablet by mouth every 4 (four) hours as needed for moderate pain.   ondansetron 4 MG tablet Commonly known as:  ZOFRAN Take 1 tablet (4 mg total) by mouth daily as needed for nausea or vomiting.   oxybutynin 5 MG tablet Commonly known as:  DITROPAN Take 1 tablet (5 mg total) by mouth 3 (three) times  daily as needed for bladder spasms.   pantoprazole 40 MG tablet Commonly known as:  PROTONIX Take 40 mg by mouth daily.   phenazopyridine 200 MG tablet Commonly known as:  PYRIDIUM Take 1 tablet (200 mg total) by mouth 3 (three) times daily with meals as needed (burning and urgency).   predniSONE 10 MG tablet Commonly known as:  DELTASONE 4 tabs po day 1,2 and 3   tamsulosin 0.4 MG Caps capsule Commonly known as:  FLOMAX Take 1 capsule (0.4 mg total) by mouth daily.        DISCHARGE INSTRUCTIONS:    Follow-up urology as outpatient Follow-up PMD 1 week   If you experience worsening of your admission symptoms, develop shortness of breath, life threatening emergency, suicidal or homicidal thoughts you must seek medical attention immediately by calling 911 or calling your MD immediately  if symptoms less severe.  You Must read complete instructions/literature along with all the possible adverse reactions/side effects for all the Medicines you take and that  have been prescribed to you. Take any new Medicines after you have completely understood and accept all the possible adverse reactions/side effects.   Please note  You were cared for by a hospitalist during your hospital stay. If you have any questions about your discharge medications or the care you received while you were in the hospital after you are discharged, you can call the unit and asked to speak with the hospitalist on call if the hospitalist that took care of you is not available. Once you are discharged, your primary care physician will handle any further medical issues. Please note that NO REFILLS for any discharge medications will be authorized once you are discharged, as it is imperative that you return to your primary care physician (or establish a relationship with a primary care physician if you do not have one) for your aftercare needs so that they can reassess your need for medications and monitor your lab  values.    Today   CHIEF COMPLAINT:  No chief complaint on file.   HISTORY OF PRESENT ILLNESS:  Takina Busser  is a 47 y.o. female came in with abdominal pain with kidney stone   VITAL SIGNS:  Blood pressure 112/62, pulse 74, temperature 98.6 F (37 C), temperature source Oral, resp. rate 20, height 5' 5.98" (1.676 m), weight (!) 141.5 kg (312 lb), SpO2 99 %.    PHYSICAL EXAMINATION:  GENERAL:  47 y.o.-year-old patient lying in the bed with no acute distress.  EYES: Pupils equal, round, reactive to light and accommodation. No scleral icterus. Extraocular muscles intact.  HEENT: Head atraumatic, normocephalic. Oropharynx and nasopharynx clear.  NECK:  Supple, no jugular venous distention. No thyroid enlargement, no tenderness.  LUNGS:  decreased breath sounds bilaterally, no wheezing, rales.  Positive rhonchi at the bases. No use of accessory muscles of respiration.  CARDIOVASCULAR: S1, S2 normal. No murmurs, rubs, or gallops.  ABDOMEN: Soft, non-tender, non-distended. Bowel sounds present. No organomegaly or mass.  EXTREMITIES:  trace edema, no cyanosis, or clubbing.  NEUROLOGIC: Cranial nerves II through XII are intact. Muscle strength 5/5 in all extremities. Sensation intact. Gait not checked.  PSYCHIATRIC: The patient is alert and oriented x 3.  SKIN: No obvious rash, lesion, or ulcer.   DATA REVIEW:   CBC Recent Labs  Lab 03/01/18 0646  WBC 5.2  HGB 11.6*  HCT 36.7  PLT 272    Chemistries  Recent Labs  Lab 02/25/18 1802  03/01/18 0646  NA 135   < > 142  K 4.1   < > 3.8  CL 101   < > 106  CO2 23   < > 28  GLUCOSE 111*   < > 95  BUN 12   < > 8  CREATININE 0.94   < > 0.86  CALCIUM 8.8*   < > 8.5*  AST 22  --   --   ALT 11*  --   --   ALKPHOS 70  --   --   BILITOT 0.8  --   --    < > = values in this interval not displayed.     Microbiology Results  Results for orders placed or performed during the hospital encounter of 02/28/18  Urine Culture      Status: Abnormal   Collection Time: 02/28/18  8:09 PM  Result Value Ref Range Status   Specimen Description   Final    URINE, CATHETERIZED Performed at [facility], [place], [place]  [address]    Special Requests   Final    NONE Performed at [facility], [address] [place], [place] [address]    Culture (A)  Final    <10,000 COLONIES/mL INSIGNIFICANT GROWTH Performed at [facility] [address] [place], [place] [address]    Report Status 03/02/2018 FINAL  Final      Management plans discussed with the patient, family and they are in agreement.  CODE STATUS:     Code Status Orders  (From admission, onward)        Start     Ordered   02/28/18 2345  Full code  Continuous     02/28/18 2344    Code Status History    Date Active Date Inactive Code Status Order ID Comments User Context   04/09/2017 01:24 04/09/2017 18:46 Full Code [id number]  Rana Snare, MD Inpatient      TOTAL TIME TAKING CARE OF THIS PATIENT: 34 minutes.    Loletha Grayer M.D on 03/04/2018 at 4:42 PM  Between 7am to 6pm - Pager - [phone]  After 6pm go to [email or web address] - password [email or web address]  [facility] Office  [phone]  CC: Primary care physician; Berkley Harvey, NP

## 2018-03-11 ENCOUNTER — Encounter: Payer: Self-pay | Admitting: Urology

## 2018-03-11 ENCOUNTER — Ambulatory Visit (INDEPENDENT_AMBULATORY_CARE_PROVIDER_SITE_OTHER): Payer: Medicaid Other | Admitting: Urology

## 2018-03-11 ENCOUNTER — Other Ambulatory Visit: Payer: Self-pay | Admitting: Radiology

## 2018-03-11 VITALS — BP 137/69 | HR 107 | Resp 17 | Ht 66.0 in | Wt 334.8 lb

## 2018-03-11 DIAGNOSIS — N200 Calculus of kidney: Secondary | ICD-10-CM

## 2018-03-11 LAB — MICROSCOPIC EXAMINATION: RBC, UA: >30 /hpf — ABNORMAL HIGH (ref 0–2)

## 2018-03-11 LAB — URINALYSIS, COMPLETE
BILIRUBIN UA: NEGATIVE
GLUCOSE, UA: NEGATIVE
Ketones, UA: NEGATIVE
Nitrite, UA: NEGATIVE
PH UA: 6 (ref 5.0–7.5)
Urobilinogen, Ur: 1 mg/dL (ref 0.2–1.0)

## 2018-03-11 MED ORDER — OXYCODONE-ACETAMINOPHEN 5-325 MG PO TABS: 1.0000 | ORAL_TABLET | Freq: Four times a day (QID) | ORAL | 0 refills | Status: DC | PRN

## 2018-03-11 MED ORDER — CIPROFLOXACIN IN D5W 400 MG/200ML IV SOLN
400.0000 mg | INTRAVENOUS | Status: AC
  Administered 2018-03-12: 400 mg via INTRAVENOUS

## 2018-03-11 NOTE — Progress Notes (Signed)
03/11/2018 8:32 AM   Carol Ortega March 13, 1971 [id number]  Referring provider: Berkley Harvey, NP [place], [place] [address]  Chief Complaint  Patient presents with  . Nephrolithiasis    HPI: 47 year old female presents for hospital follow-up.  She was admitted on 02/28/2018 with severe left renal colic secondary to a 10 mm left proximal ureteral calculus and underwent placement of a left ureteral stent for pain control.  She also has an 11 mm left lower pole calculus.  She has a history of recurrent stone disease.  She states over the past 4 days that she has had worsening left flank pain.  She denies fever or chills.  The pain radiates to the left lower quadrant and there are no identifiable precipitating, aggravating or alleviating factors.  She has run out of pain medication and is taking oxybutynin.   PMH: Past Medical History:  Diagnosis Date  . Anemia   . Anxiety   . Asthma    seasonal  . Brain tumor ([place]) 2014   tx with radiation  . Cancer ([place])   . Complication of anesthesia 1999   lung collapse after surgery for gallbladder  . Depression   . Headache    migraines  . Hoarseness of voice   . Hypertension   . Kidney stone   . Obesity   . Parathyroid abnormality ([place])   . Pulmonary emboli ([place]) 2011  . Radiation    Brain tumor  . UTI (urinary tract infection)   . Vitamin D deficiency     Surgical History: Past Surgical History:  Procedure Laterality Date  . CHOLECYSTECTOMY  1999  . CYSTOSCOPY W/ RETROGRADES Left 03/01/2018   Procedure: CYSTOSCOPY WITH RETROGRADE PYELOGRAM;  Surgeon: Abbie Sons, MD;  Location: [facility];  Service: Urology;  Laterality: Left;  . CYSTOSCOPY W/ URETERAL STENT PLACEMENT Right 04/08/2017   Procedure: CYSTOSCOPY WITH RETROGRADE PYELOGRAM/URETERAL STENT PLACEMENT;  Surgeon: Rana Snare, MD;  [facility];  Service: Urology;  Laterality: Right;  . CYSTOSCOPY W/ URETEROSCOPY    . CYSTOSCOPY WITH  RETROGRADE PYELOGRAM, URETEROSCOPY AND STENT PLACEMENT Left 01/20/2014   Procedure: LEFT URETEROSCOPY WITH STENT PLACEMENT;  Surgeon: Irine Seal, MD;  [facility];  Service: Urology;  Laterality: Left;  . CYSTOSCOPY WITH RETROGRADE PYELOGRAM, URETEROSCOPY AND STENT PLACEMENT Bilateral 04/23/2015   Procedure: CYSTOSCOPY WITH BILATERAL RETROGRADE PYELOGRAM, URETEROSCOPY AND STENT PLACEMENT;  Surgeon: Alexis Frock, MD;  [facility];  Service: Urology;  Laterality: Bilateral;  . CYSTOSCOPY WITH RETROGRADE PYELOGRAM, URETEROSCOPY AND STENT PLACEMENT Right 04/13/2017   Procedure: CYSTOSCOPY WITH RETROGRADE PYELOGRAM, URETEROSCOPY AND STENT EXCHANGE;  Surgeon: Alexis Frock, MD;  [facility];  Service: Urology;  Laterality: Right;  . CYSTOSCOPY WITH STENT PLACEMENT Left 01/12/2014   Procedure: CYSTOSCOPY WITH STENT PLACEMENT left retrograde;  Surgeon: Irine Seal, MD;  [facility];  Service: Urology;  Laterality: Left;  . CYSTOSCOPY WITH STENT PLACEMENT Left 03/01/2018   Procedure: CYSTOSCOPY WITH STENT PLACEMENT;  Surgeon: Abbie Sons, MD;  Location: [facility];  Service: Urology;  Laterality: Left;  . HOLMIUM LASER APPLICATION Left 01/24/7823   Procedure: HOLMIUM LASER APPLICATION;  Surgeon: Irine Seal, MD;  [facility];  Service: Urology;  Laterality: Left;  . HOLMIUM LASER APPLICATION Bilateral 01/21/5360   Procedure: HOLMIUM LASER APPLICATION;  Surgeon: Alexis Frock, MD;  [facility];  Service: Urology;  Laterality: Bilateral;  . HOLMIUM LASER APPLICATION Right 4/43/1540   Procedure: HOLMIUM LASER APPLICATION;  Surgeon: Alexis Frock, MD;  [facility];  Service: Urology;  Laterality: Right;  . kidney stone removal    . LITHOTRIPSY    . PARATHYROIDECTOMY Right 11/13/2016   Procedure: RIGHT SUPERIOR PARATHYROIDECTOMY;  Surgeon: Armandina Gemma, MD;  Location: [facility];  Service: General;  Laterality: Right;  . surgery for,ectopic pregnancy  2011    1 fallopian tube rupture    . TUBAL LIGATION  2011    Home Medications:  Allergies as of 03/11/2018      Reactions   Bee Venom Anaphylaxis   Contrast Media [iodinated Diagnostic Agents] Anaphylaxis   Eggs Or Egg-derived Products Anaphylaxis   Penicillins Hives, Other (See Comments)   Has patient had a PCN reaction causing immediate rash, facial/tongue/throat swelling, SOB or lightheadedness with hypotension: No Has patient had a PCN reaction causing severe rash involving mucus membranes or skin necrosis: No Has patient had a PCN reaction that required hospitalization No Has patient had a PCN reaction occurring within the last 10 years: No If all of the above answers are "NO", then may proceed with Cephalosporin use.   Latex Rash   Oxycodone-acetaminophen Itching      Medication List        Accurate as of 03/11/18  8:32 AM. Always use your most recent med list.          acetaminophen 500 MG tablet Commonly known as:  TYLENOL Take 1,000 mg by mouth every 6 (six) hours as needed for mild pain, moderate pain, fever or headache.   albuterol (2.5 MG/3ML) 0.083% nebulizer solution Commonly known as:  PROVENTIL Take 3 mLs (2.5 mg total) by nebulization every 6 (six) hours as needed for wheezing or shortness of breath.   albuterol (2.5 MG/3ML) 0.083% nebulizer solution Commonly known as:  PROVENTIL Inhale 3 mLs (2.5 mg total) into the lungs every 6 (six) hours as needed for wheezing or shortness of breath.   albuterol 108 (90 Base) MCG/ACT inhaler Commonly known as:  PROVENTIL HFA;VENTOLIN HFA Inhale 2 puffs into the lungs every 6 (six) hours as needed for wheezing or shortness of breath.   albuterol 108 (90 Base) MCG/ACT inhaler Commonly known as:  PROVENTIL HFA;VENTOLIN HFA Inhale 2 puffs into the lungs every 6 (six) hours as needed for wheezing or shortness of breath.   amoxicillin-clavulanate 875-125 MG tablet Commonly known as:  AUGMENTIN Take 1 tablet by mouth every 12 (twelve) hours.    azithromycin 250 MG tablet Commonly known as:  ZITHROMAX One tablet daily for 2 days   beclomethasone 80 MCG/ACT inhaler Commonly known as:  QVAR REDIHALER Inhale 1 puff into the lungs 2 (two) times daily.   HYDROcodone-acetaminophen 5-325 MG tablet Commonly known as:  NORCO Take 1 tablet by mouth every 4 (four) hours as needed for moderate pain.   ondansetron 4 MG tablet Commonly known as:  ZOFRAN Take 1 tablet (4 mg total) by mouth daily as needed for nausea or vomiting.   oxybutynin 5 MG tablet Commonly known as:  DITROPAN Take 1 tablet (5 mg total) by mouth 3 (three) times daily as needed for bladder spasms.   pantoprazole 40 MG tablet Commonly known as:  PROTONIX Take 40 mg by mouth daily.   phenazopyridine 200 MG tablet Commonly known as:  PYRIDIUM Take 1 tablet (200 mg total) by mouth 3 (three) times daily with meals as needed (burning and urgency).   predniSONE 10 MG tablet Commonly known as:  DELTASONE 4 tabs po day 1,2 and 3  tamsulosin 0.4 MG Caps capsule Commonly known as:  FLOMAX Take 1 capsule (0.4 mg total) by mouth daily.       Allergies:  Allergies  Allergen Reactions  . Bee Venom Anaphylaxis  . Contrast Media [Iodinated Diagnostic Agents] Anaphylaxis  . Eggs Or Egg-Derived Products Anaphylaxis  . Penicillins Hives and Other (See Comments)    Has patient had a PCN reaction causing immediate rash, facial/tongue/throat swelling, SOB or lightheadedness with hypotension: No Has patient had a PCN reaction causing severe rash involving mucus membranes or skin necrosis: No Has patient had a PCN reaction that required hospitalization No Has patient had a PCN reaction occurring within the last 10 years: No If all of the above answers are "NO", then may proceed with Cephalosporin use.  . Latex Rash  . Oxycodone-Acetaminophen Itching    Family History: Family History  Problem Relation Age of Onset  . Bell's palsy Mother   . Heart failure Mother   .  Asthma Father   . Hypertension Father     Social History:  reports that she has never smoked. She has never used smokeless tobacco. She reports that she drinks alcohol. She reports that she does not use drugs.  ROS: UROLOGY Frequent Urination?: No Hard to postpone urination?: No Burning/pain with urination?: Yes Get up at night to urinate?: Yes Leakage of urine?: No Urine stream starts and stops?: No Trouble starting stream?: No Do you have to strain to urinate?: No Blood in urine?: Yes Urinary tract infection?: No Sexually transmitted disease?: No Injury to kidneys or bladder?: No Painful intercourse?: No Weak stream?: No Currently pregnant?: No Vaginal bleeding?: No  Gastrointestinal Nausea?: No Vomiting?: Yes Indigestion/heartburn?: No Diarrhea?: No Constipation?: No  Constitutional Fever: No Night sweats?: No Weight loss?: No Fatigue?: No  Skin Skin rash/lesions?: No Itching?: No  Eyes Blurred vision?: No Double vision?: No  Ears/Nose/Throat Sore throat?: No Sinus problems?: No  Hematologic/Lymphatic Swollen glands?: No Easy bruising?: No  Cardiovascular Leg swelling?: No Chest pain?: No  Respiratory Cough?: No Shortness of breath?: No  Endocrine Excessive thirst?: No  Musculoskeletal Back pain?: No Joint pain?: No  Neurological Headaches?: No Dizziness?: No  Psychologic Depression?: Yes Anxiety?: Yes  Physical Exam: BP 137/69   Pulse (!) 107   Resp 17   Ht 5\' 6"  (1.676 m)   Wt (!) 334 lb 12.8 oz (151.9 kg)   SpO2 97%   BMI 54.04 kg/m   Constitutional:  Alert and oriented, No acute distress. HEENT: [place] AT, moist mucus membranes.  Trachea midline, no masses. Cardiovascular: No clubbing, cyanosis, or edema.  RRR Respiratory: Normal respiratory effort, no increased work of breathing.  Lungs clear GI: Abdomen is soft, nontender, nondistended, no abdominal masses GU: No CVA tenderness Lymph: No cervical or inguinal  lymphadenopathy. Skin: No rashes, bruises or suspicious lesions. Neurologic: Grossly intact, no focal deficits, moving all 4 extremities. Psychiatric: Normal mood and affect.   Assessment & Plan:   47 year old female status post left ureteral stent placement for an obstructing 10 mm left proximal ureteral calculus.  She has had increasing pain the last 4 days and will obtain a KUB to make sure her stent is in good position.  A urine culture will also be ordered.  I recommended scheduling left ureteroscopy with laser lithotripsy and stone extraction.  We discussed the alternative of shockwave lithotripsy however both of her stones could not be treated due to the limited number of shocks that can be performed.  She would  like to schedule lithotripsy.  She was also given Myrbetriq samples for stent symptoms and Rx oxycodone was sent to her pharmacy.  The indications and nature of the planned procedure were discussed as well as the potential  benefits and expected outcome.  Alternatives have been discussed in detail. The most common complications and side effects were discussed including but not limited to infection/sepsis; blood loss; damage to urethra, bladder, ureter, kidney; need for multiple surgeries; need for prolonged stent placement; inability to remove the stone as well as general anesthesia risks.  All of her questions were answered and she desires to proceed.  At the time of our visit she did not appear to be distracted or in pain.    Abbie Sons, [place] [address], [place], [place] [address] [phone]

## 2018-03-11 NOTE — H&P (View-Only) (Signed)
03/11/2018 8:32 AM   Carol Ortega 08/15/1971 [id number]  Referring provider: Berkley Harvey, NP [place], [place] [address]  Chief Complaint  Patient presents with  . Nephrolithiasis    HPI: 47 year old female presents for hospital follow-up.  She was admitted on 02/28/2018 with severe left renal colic secondary to a 10 mm left proximal ureteral calculus and underwent placement of a left ureteral stent for pain control.  She also has an 11 mm left lower pole calculus.  She has a history of recurrent stone disease.  She states over the past 4 days that she has had worsening left flank pain.  She denies fever or chills.  The pain radiates to the left lower quadrant and there are no identifiable precipitating, aggravating or alleviating factors.  She has run out of pain medication and is taking oxybutynin.   PMH: Past Medical History:  Diagnosis Date  . Anemia   . Anxiety   . Asthma    seasonal  . Brain tumor ([place]) 2014   tx with radiation  . Cancer ([place])   . Complication of anesthesia 1999   lung collapse after surgery for gallbladder  . Depression   . Headache    migraines  . Hoarseness of voice   . Hypertension   . Kidney stone   . Obesity   . Parathyroid abnormality ([place])   . Pulmonary emboli ([place]) 2011  . Radiation    Brain tumor  . UTI (urinary tract infection)   . Vitamin D deficiency     Surgical History: Past Surgical History:  Procedure Laterality Date  . CHOLECYSTECTOMY  1999  . CYSTOSCOPY W/ RETROGRADES Left 03/01/2018   Procedure: CYSTOSCOPY WITH RETROGRADE PYELOGRAM;  Surgeon: Abbie Sons, MD;  Location: [facility];  Service: Urology;  Laterality: Left;  . CYSTOSCOPY W/ URETERAL STENT PLACEMENT Right 04/08/2017   Procedure: CYSTOSCOPY WITH RETROGRADE PYELOGRAM/URETERAL STENT PLACEMENT;  Surgeon: Rana Snare, MD;  [facility];  Service: Urology;  Laterality: Right;  . CYSTOSCOPY W/ URETEROSCOPY    . CYSTOSCOPY WITH  RETROGRADE PYELOGRAM, URETEROSCOPY AND STENT PLACEMENT Left 01/20/2014   Procedure: LEFT URETEROSCOPY WITH STENT PLACEMENT;  Surgeon: Irine Seal, MD;  [facility];  Service: Urology;  Laterality: Left;  . CYSTOSCOPY WITH RETROGRADE PYELOGRAM, URETEROSCOPY AND STENT PLACEMENT Bilateral 04/23/2015   Procedure: CYSTOSCOPY WITH BILATERAL RETROGRADE PYELOGRAM, URETEROSCOPY AND STENT PLACEMENT;  Surgeon: Alexis Frock, MD;  [facility];  Service: Urology;  Laterality: Bilateral;  . CYSTOSCOPY WITH RETROGRADE PYELOGRAM, URETEROSCOPY AND STENT PLACEMENT Right 04/13/2017   Procedure: CYSTOSCOPY WITH RETROGRADE PYELOGRAM, URETEROSCOPY AND STENT EXCHANGE;  Surgeon: Alexis Frock, MD;  [facility];  Service: Urology;  Laterality: Right;  . CYSTOSCOPY WITH STENT PLACEMENT Left 01/12/2014   Procedure: CYSTOSCOPY WITH STENT PLACEMENT left retrograde;  Surgeon: Irine Seal, MD;  [facility];  Service: Urology;  Laterality: Left;  . CYSTOSCOPY WITH STENT PLACEMENT Left 03/01/2018   Procedure: CYSTOSCOPY WITH STENT PLACEMENT;  Surgeon: Abbie Sons, MD;  Location: [facility];  Service: Urology;  Laterality: Left;  . HOLMIUM LASER APPLICATION Left 12/26/1476   Procedure: HOLMIUM LASER APPLICATION;  Surgeon: Irine Seal, MD;  [facility];  Service: Urology;  Laterality: Left;  . HOLMIUM LASER APPLICATION Bilateral 01/27/5620   Procedure: HOLMIUM LASER APPLICATION;  Surgeon: Alexis Frock, MD;  [facility];  Service: Urology;  Laterality: Bilateral;  . HOLMIUM LASER APPLICATION Right 02/22/6577   Procedure: HOLMIUM LASER APPLICATION;  Surgeon: Alexis Frock, MD;  [facility];  Service: Urology;  Laterality: Right;  . kidney stone removal    . LITHOTRIPSY    . PARATHYROIDECTOMY Right 11/13/2016   Procedure: RIGHT SUPERIOR PARATHYROIDECTOMY;  Surgeon: Armandina Gemma, MD;  Location: [facility];  Service: General;  Laterality: Right;  . surgery for,ectopic pregnancy  2011    1 fallopian tube rupture    . TUBAL LIGATION  2011    Home Medications:  Allergies as of 03/11/2018      Reactions   Bee Venom Anaphylaxis   Contrast Media [iodinated Diagnostic Agents] Anaphylaxis   Eggs Or Egg-derived Products Anaphylaxis   Penicillins Hives, Other (See Comments)   Has patient had a PCN reaction causing immediate rash, facial/tongue/throat swelling, SOB or lightheadedness with hypotension: No Has patient had a PCN reaction causing severe rash involving mucus membranes or skin necrosis: No Has patient had a PCN reaction that required hospitalization No Has patient had a PCN reaction occurring within the last 10 years: No If all of the above answers are "NO", then may proceed with Cephalosporin use.   Latex Rash   Oxycodone-acetaminophen Itching      Medication List        Accurate as of 03/11/18  8:32 AM. Always use your most recent med list.          acetaminophen 500 MG tablet Commonly known as:  TYLENOL Take 1,000 mg by mouth every 6 (six) hours as needed for mild pain, moderate pain, fever or headache.   albuterol (2.5 MG/3ML) 0.083% nebulizer solution Commonly known as:  PROVENTIL Take 3 mLs (2.5 mg total) by nebulization every 6 (six) hours as needed for wheezing or shortness of breath.   albuterol (2.5 MG/3ML) 0.083% nebulizer solution Commonly known as:  PROVENTIL Inhale 3 mLs (2.5 mg total) into the lungs every 6 (six) hours as needed for wheezing or shortness of breath.   albuterol 108 (90 Base) MCG/ACT inhaler Commonly known as:  PROVENTIL HFA;VENTOLIN HFA Inhale 2 puffs into the lungs every 6 (six) hours as needed for wheezing or shortness of breath.   albuterol 108 (90 Base) MCG/ACT inhaler Commonly known as:  PROVENTIL HFA;VENTOLIN HFA Inhale 2 puffs into the lungs every 6 (six) hours as needed for wheezing or shortness of breath.   amoxicillin-clavulanate 875-125 MG tablet Commonly known as:  AUGMENTIN Take 1 tablet by mouth every 12 (twelve) hours.    azithromycin 250 MG tablet Commonly known as:  ZITHROMAX One tablet daily for 2 days   beclomethasone 80 MCG/ACT inhaler Commonly known as:  QVAR REDIHALER Inhale 1 puff into the lungs 2 (two) times daily.   HYDROcodone-acetaminophen 5-325 MG tablet Commonly known as:  NORCO Take 1 tablet by mouth every 4 (four) hours as needed for moderate pain.   ondansetron 4 MG tablet Commonly known as:  ZOFRAN Take 1 tablet (4 mg total) by mouth daily as needed for nausea or vomiting.   oxybutynin 5 MG tablet Commonly known as:  DITROPAN Take 1 tablet (5 mg total) by mouth 3 (three) times daily as needed for bladder spasms.   pantoprazole 40 MG tablet Commonly known as:  PROTONIX Take 40 mg by mouth daily.   phenazopyridine 200 MG tablet Commonly known as:  PYRIDIUM Take 1 tablet (200 mg total) by mouth 3 (three) times daily with meals as needed (burning and urgency).   predniSONE 10 MG tablet Commonly known as:  DELTASONE 4 tabs po day 1,2 and 3  tamsulosin 0.4 MG Caps capsule Commonly known as:  FLOMAX Take 1 capsule (0.4 mg total) by mouth daily.       Allergies:  Allergies  Allergen Reactions  . Bee Venom Anaphylaxis  . Contrast Media [Iodinated Diagnostic Agents] Anaphylaxis  . Eggs Or Egg-Derived Products Anaphylaxis  . Penicillins Hives and Other (See Comments)    Has patient had a PCN reaction causing immediate rash, facial/tongue/throat swelling, SOB or lightheadedness with hypotension: No Has patient had a PCN reaction causing severe rash involving mucus membranes or skin necrosis: No Has patient had a PCN reaction that required hospitalization No Has patient had a PCN reaction occurring within the last 10 years: No If all of the above answers are "NO", then may proceed with Cephalosporin use.  . Latex Rash  . Oxycodone-Acetaminophen Itching    Family History: Family History  Problem Relation Age of Onset  . Bell's palsy Mother   . Heart failure Mother   .  Asthma Father   . Hypertension Father     Social History:  reports that she has never smoked. She has never used smokeless tobacco. She reports that she drinks alcohol. She reports that she does not use drugs.  ROS: UROLOGY Frequent Urination?: No Hard to postpone urination?: No Burning/pain with urination?: Yes Get up at night to urinate?: Yes Leakage of urine?: No Urine stream starts and stops?: No Trouble starting stream?: No Do you have to strain to urinate?: No Blood in urine?: Yes Urinary tract infection?: No Sexually transmitted disease?: No Injury to kidneys or bladder?: No Painful intercourse?: No Weak stream?: No Currently pregnant?: No Vaginal bleeding?: No  Gastrointestinal Nausea?: No Vomiting?: Yes Indigestion/heartburn?: No Diarrhea?: No Constipation?: No  Constitutional Fever: No Night sweats?: No Weight loss?: No Fatigue?: No  Skin Skin rash/lesions?: No Itching?: No  Eyes Blurred vision?: No Double vision?: No  Ears/Nose/Throat Sore throat?: No Sinus problems?: No  Hematologic/Lymphatic Swollen glands?: No Easy bruising?: No  Cardiovascular Leg swelling?: No Chest pain?: No  Respiratory Cough?: No Shortness of breath?: No  Endocrine Excessive thirst?: No  Musculoskeletal Back pain?: No Joint pain?: No  Neurological Headaches?: No Dizziness?: No  Psychologic Depression?: Yes Anxiety?: Yes  Physical Exam: BP 137/69   Pulse (!) 107   Resp 17   Ht 5\' 6"  (1.676 m)   Wt (!) 334 lb 12.8 oz (151.9 kg)   SpO2 97%   BMI 54.04 kg/m   Constitutional:  Alert and oriented, No acute distress. HEENT: [place] AT, moist mucus membranes.  Trachea midline, no masses. Cardiovascular: No clubbing, cyanosis, or edema.  RRR Respiratory: Normal respiratory effort, no increased work of breathing.  Lungs clear GI: Abdomen is soft, nontender, nondistended, no abdominal masses GU: No CVA tenderness Lymph: No cervical or inguinal  lymphadenopathy. Skin: No rashes, bruises or suspicious lesions. Neurologic: Grossly intact, no focal deficits, moving all 4 extremities. Psychiatric: Normal mood and affect.   Assessment & Plan:   47 year old female status post left ureteral stent placement for an obstructing 10 mm left proximal ureteral calculus.  She has had increasing pain the last 4 days and will obtain a KUB to make sure her stent is in good position.  A urine culture will also be ordered.  I recommended scheduling left ureteroscopy with laser lithotripsy and stone extraction.  We discussed the alternative of shockwave lithotripsy however both of her stones could not be treated due to the limited number of shocks that can be performed.  She would  like to schedule lithotripsy.  She was also given Myrbetriq samples for stent symptoms and Rx oxycodone was sent to her pharmacy.  The indications and nature of the planned procedure were discussed as well as the potential  benefits and expected outcome.  Alternatives have been discussed in detail. The most common complications and side effects were discussed including but not limited to infection/sepsis; blood loss; damage to urethra, bladder, ureter, kidney; need for multiple surgeries; need for prolonged stent placement; inability to remove the stone as well as general anesthesia risks.  All of her questions were answered and she desires to proceed.  At the time of our visit she did not appear to be distracted or in pain.    Abbie Sons, [place] [address] [place], [place] [address] [phone]

## 2018-03-12 ENCOUNTER — Encounter: Payer: Self-pay | Admitting: *Deleted

## 2018-03-12 ENCOUNTER — Observation Stay
Admission: RE | Admit: 2018-03-12 | Discharge: 2018-03-13 | Disposition: A | Discharge status: Home / Self Care | Payer: Medicaid Other | Source: Ambulatory Visit | Attending: Internal Medicine | Admitting: Internal Medicine

## 2018-03-12 ENCOUNTER — Encounter
Admission: RE | Disposition: A | Discharge status: Home / Self Care | Payer: Self-pay | Source: Ambulatory Visit | Attending: Internal Medicine

## 2018-03-12 ENCOUNTER — Ambulatory Visit: Payer: Medicaid Other | Admitting: Certified Registered Nurse Anesthetist

## 2018-03-12 ENCOUNTER — Other Ambulatory Visit: Payer: Self-pay

## 2018-03-12 DIAGNOSIS — N132 Hydronephrosis with renal and ureteral calculous obstruction: Secondary | ICD-10-CM

## 2018-03-12 DIAGNOSIS — I10 Essential (primary) hypertension: Secondary | ICD-10-CM | POA: Insufficient documentation

## 2018-03-12 DIAGNOSIS — Z6841 Body Mass Index (BMI) 40.0 and over, adult: Secondary | ICD-10-CM | POA: Insufficient documentation

## 2018-03-12 DIAGNOSIS — Z7951 Long term (current) use of inhaled steroids: Secondary | ICD-10-CM | POA: Insufficient documentation

## 2018-03-12 DIAGNOSIS — Z86011 Personal history of benign neoplasm of the brain: Secondary | ICD-10-CM | POA: Diagnosis not present

## 2018-03-12 DIAGNOSIS — Z7952 Long term (current) use of systemic steroids: Secondary | ICD-10-CM | POA: Diagnosis not present

## 2018-03-12 DIAGNOSIS — Z91041 Radiographic dye allergy status: Secondary | ICD-10-CM | POA: Insufficient documentation

## 2018-03-12 DIAGNOSIS — Z87442 Personal history of urinary calculi: Secondary | ICD-10-CM | POA: Diagnosis not present

## 2018-03-12 DIAGNOSIS — N200 Calculus of kidney: Secondary | ICD-10-CM | POA: Diagnosis present

## 2018-03-12 DIAGNOSIS — Z79899 Other long term (current) drug therapy: Secondary | ICD-10-CM | POA: Diagnosis not present

## 2018-03-12 DIAGNOSIS — Z91030 Bee allergy status: Secondary | ICD-10-CM | POA: Insufficient documentation

## 2018-03-12 DIAGNOSIS — J45909 Unspecified asthma, uncomplicated: Secondary | ICD-10-CM | POA: Insufficient documentation

## 2018-03-12 DIAGNOSIS — Z86711 Personal history of pulmonary embolism: Secondary | ICD-10-CM | POA: Diagnosis not present

## 2018-03-12 DIAGNOSIS — R079 Chest pain, unspecified: Secondary | ICD-10-CM

## 2018-03-12 DIAGNOSIS — E892 Postprocedural hypoparathyroidism: Secondary | ICD-10-CM | POA: Insufficient documentation

## 2018-03-12 DIAGNOSIS — Z880 Allergy status to penicillin: Secondary | ICD-10-CM | POA: Insufficient documentation

## 2018-03-12 DIAGNOSIS — Z466 Encounter for fitting and adjustment of urinary device: Secondary | ICD-10-CM

## 2018-03-12 DIAGNOSIS — Z91012 Allergy to eggs: Secondary | ICD-10-CM | POA: Insufficient documentation

## 2018-03-12 DIAGNOSIS — Z923 Personal history of irradiation: Secondary | ICD-10-CM | POA: Insufficient documentation

## 2018-03-12 DIAGNOSIS — R0789 Other chest pain: Secondary | ICD-10-CM | POA: Insufficient documentation

## 2018-03-12 DIAGNOSIS — N3289 Other specified disorders of bladder: Secondary | ICD-10-CM | POA: Diagnosis present

## 2018-03-12 HISTORY — PX: CYSTOSCOPY W/ URETERAL STENT PLACEMENT: SHX1429

## 2018-03-12 HISTORY — PX: CYSTOSCOPY/URETEROSCOPY/HOLMIUM LASER/STENT PLACEMENT: SHX6546

## 2018-03-12 LAB — POCT PREGNANCY, URINE: Preg Test, Ur: NEGATIVE

## 2018-03-12 LAB — TROPONIN I
Troponin I: <0.03 ng/mL (ref <0.03)
Troponin I: <0.03 ng/mL (ref <0.03)

## 2018-03-12 SURGERY — CYSTOSCOPY/URETEROSCOPY/HOLMIUM LASER/STENT PLACEMENT
Anesthesia: General | Site: Ureter | Laterality: Left | Wound class: Clean Contaminated

## 2018-03-12 MED ORDER — CIPROFLOXACIN IN D5W 400 MG/200ML IV SOLN
INTRAVENOUS | Status: AC
  Filled 2018-03-12: qty 200

## 2018-03-12 MED ORDER — ACETAMINOPHEN 325 MG PO TABS
650.0000 mg | ORAL_TABLET | Freq: Four times a day (QID) | ORAL | Status: DC | PRN
  Administered 2018-03-13: 650 mg via ORAL
  Filled 2018-03-12: qty 2

## 2018-03-12 MED ORDER — IOTHALAMATE MEGLUMINE 43 % IV SOLN
INTRAVENOUS | Status: DC | PRN
  Administered 2018-03-12: 15 mL

## 2018-03-12 MED ORDER — BELLADONNA ALKALOIDS-OPIUM 16.2-60 MG RE SUPP: 1.0000 | Freq: Three times a day (TID) | RECTAL | Status: DC | PRN

## 2018-03-12 MED ORDER — FENTANYL CITRATE (PF) 100 MCG/2ML IJ SOLN
INTRAMUSCULAR | Status: AC
  Administered 2018-03-12: 50 ug via INTRAVENOUS
  Filled 2018-03-12: qty 2

## 2018-03-12 MED ORDER — MIDAZOLAM HCL 2 MG/2ML IJ SOLN
INTRAMUSCULAR | Status: DC | PRN
  Administered 2018-03-12: 2 mg via INTRAVENOUS

## 2018-03-12 MED ORDER — PROPOFOL 10 MG/ML IV BOLUS
INTRAVENOUS | Status: AC
  Filled 2018-03-12: qty 20

## 2018-03-12 MED ORDER — GLYCOPYRROLATE 0.2 MG/ML IJ SOLN
INTRAMUSCULAR | Status: AC
  Filled 2018-03-12: qty 1

## 2018-03-12 MED ORDER — FENTANYL CITRATE (PF) 100 MCG/2ML IJ SOLN
INTRAMUSCULAR | Status: AC
  Filled 2018-03-12: qty 2

## 2018-03-12 MED ORDER — DEXAMETHASONE SODIUM PHOSPHATE 10 MG/ML IJ SOLN
INTRAMUSCULAR | Status: AC
Start: 2018-03-12 — End: ?
  Filled 2018-03-12: qty 1

## 2018-03-12 MED ORDER — PROMETHAZINE HCL 25 MG/ML IJ SOLN: 6.2500 mg | INTRAMUSCULAR | Status: DC | PRN

## 2018-03-12 MED ORDER — HYDROMORPHONE HCL 1 MG/ML IJ SOLN
0.5000 mg | Freq: Once | INTRAMUSCULAR | Status: AC
  Administered 2018-03-12: 0.5 mg via INTRAVENOUS

## 2018-03-12 MED ORDER — SIMETHICONE 80 MG PO CHEW
80.0000 mg | CHEWABLE_TABLET | Freq: Four times a day (QID) | ORAL | Status: DC | PRN
  Administered 2018-03-12 – 2018-03-13 (×2): 80 mg via ORAL
  Filled 2018-03-12 (×3): qty 1

## 2018-03-12 MED ORDER — ONDANSETRON HCL 4 MG PO TABS: 4.0000 mg | ORAL_TABLET | Freq: Every day | ORAL | 0 refills | Status: DC | PRN

## 2018-03-12 MED ORDER — TAMSULOSIN HCL 0.4 MG PO CAPS
0.4000 mg | ORAL_CAPSULE | Freq: Every day | ORAL | 0 refills | Status: DC
Start: 2018-03-12 — End: 2018-05-09

## 2018-03-12 MED ORDER — LORAZEPAM 2 MG/ML IJ SOLN
INTRAMUSCULAR | Status: AC
  Administered 2018-03-12: 0.5 mg via INTRAVENOUS
  Filled 2018-03-12: qty 1

## 2018-03-12 MED ORDER — LIDOCAINE HCL (PF) 2 % IJ SOLN
INTRAMUSCULAR | Status: AC
  Filled 2018-03-12: qty 10

## 2018-03-12 MED ORDER — HYDROMORPHONE HCL 1 MG/ML IJ SOLN
INTRAMUSCULAR | Status: AC
  Administered 2018-03-12: 0.5 mg via INTRAVENOUS
  Filled 2018-03-12: qty 1

## 2018-03-12 MED ORDER — LORAZEPAM 2 MG/ML IJ SOLN
0.5000 mg | Freq: Once | INTRAMUSCULAR | Status: AC
  Administered 2018-03-12: 0.5 mg via INTRAVENOUS

## 2018-03-12 MED ORDER — LACTATED RINGERS IV SOLN
INTRAVENOUS | Status: DC
  Administered 2018-03-12 (×2): via INTRAVENOUS

## 2018-03-12 MED ORDER — DEXAMETHASONE SODIUM PHOSPHATE 10 MG/ML IJ SOLN
INTRAMUSCULAR | Status: DC | PRN
  Administered 2018-03-12: 10 mg via INTRAVENOUS

## 2018-03-12 MED ORDER — FENTANYL CITRATE (PF) 100 MCG/2ML IJ SOLN
50.0000 ug | INTRAMUSCULAR | Status: AC | PRN
  Administered 2018-03-12 (×2): 50 ug via INTRAVENOUS

## 2018-03-12 MED ORDER — OXYBUTYNIN CHLORIDE 5 MG PO TABS: 5.0000 mg | ORAL_TABLET | Freq: Three times a day (TID) | ORAL | 0 refills | Status: DC | PRN

## 2018-03-12 MED ORDER — ENOXAPARIN SODIUM 40 MG/0.4ML ~~LOC~~ SOLN
40.0000 mg | Freq: Two times a day (BID) | SUBCUTANEOUS | Status: DC
  Filled 2018-03-12: qty 0.4

## 2018-03-12 MED ORDER — MEPERIDINE HCL 50 MG/ML IJ SOLN: 6.2500 mg | INTRAMUSCULAR | Status: DC | PRN

## 2018-03-12 MED ORDER — PROPOFOL 10 MG/ML IV BOLUS
INTRAVENOUS | Status: DC | PRN
  Administered 2018-03-12: 180 mg via INTRAVENOUS
  Administered 2018-03-12: 20 mg via INTRAVENOUS

## 2018-03-12 MED ORDER — ONDANSETRON HCL 4 MG/2ML IJ SOLN: 4.0000 mg | Freq: Four times a day (QID) | INTRAMUSCULAR | Status: DC | PRN

## 2018-03-12 MED ORDER — ONDANSETRON HCL 4 MG/2ML IJ SOLN
INTRAMUSCULAR | Status: AC
  Filled 2018-03-12: qty 2

## 2018-03-12 MED ORDER — ALBUTEROL SULFATE (2.5 MG/3ML) 0.083% IN NEBU: 2.5000 mg | INHALATION_SOLUTION | Freq: Four times a day (QID) | RESPIRATORY_TRACT | Status: DC | PRN

## 2018-03-12 MED ORDER — HYDROMORPHONE HCL 1 MG/ML IJ SOLN
0.5000 mg | INTRAMUSCULAR | Status: DC | PRN
  Administered 2018-03-12 (×2): 0.5 mg via INTRAVENOUS

## 2018-03-12 MED ORDER — OXYBUTYNIN CHLORIDE 5 MG PO TABS
5.0000 mg | ORAL_TABLET | Freq: Two times a day (BID) | ORAL | Status: DC
  Administered 2018-03-12: 5 mg via ORAL
  Filled 2018-03-12: qty 1

## 2018-03-12 MED ORDER — ACETAMINOPHEN 650 MG RE SUPP: 650.0000 mg | Freq: Four times a day (QID) | RECTAL | Status: DC | PRN

## 2018-03-12 MED ORDER — ENOXAPARIN SODIUM 40 MG/0.4ML ~~LOC~~ SOLN: 40.0000 mg | SUBCUTANEOUS | Status: DC

## 2018-03-12 MED ORDER — ONDANSETRON HCL 4 MG PO TABS: 4.0000 mg | ORAL_TABLET | Freq: Four times a day (QID) | ORAL | Status: DC | PRN

## 2018-03-12 MED ORDER — FENTANYL CITRATE (PF) 100 MCG/2ML IJ SOLN
25.0000 ug | INTRAMUSCULAR | Status: DC | PRN
  Administered 2018-03-12 (×3): 50 ug via INTRAVENOUS

## 2018-03-12 MED ORDER — OXYBUTYNIN CHLORIDE 5 MG PO TABS
5.0000 mg | ORAL_TABLET | Freq: Three times a day (TID) | ORAL | Status: DC | PRN
  Administered 2018-03-13: 5 mg via ORAL
  Filled 2018-03-12: qty 1

## 2018-03-12 MED ORDER — LIDOCAINE HCL (CARDIAC) 20 MG/ML IV SOLN
INTRAVENOUS | Status: DC | PRN
  Administered 2018-03-12: 80 mg via INTRAVENOUS

## 2018-03-12 MED ORDER — MIDAZOLAM HCL 2 MG/2ML IJ SOLN
INTRAMUSCULAR | Status: AC
  Filled 2018-03-12: qty 2

## 2018-03-12 MED ORDER — FENTANYL CITRATE (PF) 100 MCG/2ML IJ SOLN
INTRAMUSCULAR | Status: DC | PRN
  Administered 2018-03-12 (×3): 50 ug via INTRAVENOUS

## 2018-03-12 MED ORDER — TRAMADOL HCL 50 MG PO TABS
50.0000 mg | ORAL_TABLET | Freq: Four times a day (QID) | ORAL | Status: DC | PRN
  Administered 2018-03-12 – 2018-03-13 (×3): 50 mg via ORAL
  Filled 2018-03-12 (×3): qty 1

## 2018-03-12 MED ORDER — ONDANSETRON HCL 4 MG/2ML IJ SOLN
INTRAMUSCULAR | Status: DC | PRN
  Administered 2018-03-12: 4 mg via INTRAVENOUS

## 2018-03-12 SURGICAL SUPPLY — 29 items
BAG DRAIN CYSTO-URO LG1000N (MISCELLANEOUS) ×3 IMPLANT
BASKET ZERO TIP 1.9FR (BASKET) ×3 IMPLANT
BRUSH SCRUB EZ 1% IODOPHOR (MISCELLANEOUS) ×3 IMPLANT
CATH URETL 5X70 OPEN END (CATHETERS) ×3 IMPLANT
CNTNR SPEC 2.5X3XGRAD LEK (MISCELLANEOUS) ×1
CONRAY 43 FOR UROLOGY 50M (MISCELLANEOUS) ×3 IMPLANT
CONT SPEC 4OZ STER OR WHT (MISCELLANEOUS) ×2
CONTAINER SPEC 2.5X3XGRAD LEK (MISCELLANEOUS) ×1 IMPLANT
DRAPE UTILITY 15X26 TOWEL STRL (DRAPES) ×3 IMPLANT
FIBER LASER LITHO 273 (Laser) ×3 IMPLANT
GLOVE BIO SURGEON STRL SZ8 (GLOVE) ×3 IMPLANT
GOWN STRL REUS W/ TWL LRG LVL3 (GOWN DISPOSABLE) ×2 IMPLANT
GOWN STRL REUS W/TWL LRG LVL3 (GOWN DISPOSABLE) ×4
GUIDEWIRE GREEN .038 145CM (MISCELLANEOUS) IMPLANT
INFUSOR MANOMETER BAG 3000ML (MISCELLANEOUS) ×3 IMPLANT
INTRODUCER DILATOR DOUBLE (INTRODUCER) IMPLANT
KIT TURNOVER CYSTO (KITS) ×3 IMPLANT
PACK CYSTO AR (MISCELLANEOUS) ×3 IMPLANT
SENSORWIRE 0.038 NOT ANGLED (WIRE) ×3
SET CYSTO W/LG BORE CLAMP LF (SET/KITS/TRAYS/PACK) ×3 IMPLANT
SHEATH URETERAL 12FRX35CM (MISCELLANEOUS) ×3 IMPLANT
SOL .9 NS 3000ML IRR  AL (IV SOLUTION) ×2
SOL .9 NS 3000ML IRR UROMATIC (IV SOLUTION) ×1 IMPLANT
STENT URET 6FRX22 CONTOUR (STENTS) ×3 IMPLANT
STENT URET 6FRX24 CONTOUR (STENTS) IMPLANT
STENT URET 6FRX26 CONTOUR (STENTS) IMPLANT
SURGILUBE 2OZ TUBE FLIPTOP (MISCELLANEOUS) ×3 IMPLANT
WATER STERILE IRR 1000ML POUR (IV SOLUTION) ×3 IMPLANT
WIRE SENSOR 0.038 NOT ANGLED (WIRE) ×1 IMPLANT

## 2018-03-12 NOTE — Interval H&P Note (Signed)
History and Physical Interval Note:  03/12/2018 11:12 AM  Carol Ortega  has presented today for surgery, with the diagnosis of left nephrolithiasis  The various methods of treatment have been discussed with the patient and family. After consideration of risks, benefits and other options for treatment, the patient has consented to  Procedure(s): CYSTOSCOPY/URETEROSCOPY/HOLMIUM LASER/STENT exchange vs removal (Left) as a surgical intervention .  The patient's history has been reviewed, patient examined, no change in status, stable for surgery.  I have reviewed the patient's chart and labs.  Questions were answered to the patient's satisfaction.     [place]

## 2018-03-12 NOTE — OR Nursing (Signed)
Called Dr Bernardo Heater regarding PAUC events. OK for hospitalist to place in hospital.

## 2018-03-12 NOTE — Anesthesia Preprocedure Evaluation (Addendum)
Anesthesia Evaluation  Patient identified by MRN, date of birth, ID band Patient awake    Reviewed: Allergy & Precautions, H&P , NPO status , reviewed documented beta blocker date and time   History of Anesthesia Complications (+) DIFFICULT IV STICK / SPECIAL LINE and history of anesthetic complications  Airway Mallampati: III  TM Distance: >3 FB     Dental  (+) Chipped   Pulmonary asthma ,    Pulmonary exam normal  + decreased breath sounds      Cardiovascular hypertension, Normal cardiovascular exam     Neuro/Psych  Headaches, PSYCHIATRIC DISORDERS Anxiety Depression    GI/Hepatic   Endo/Other    Renal/GU Renal disease     Musculoskeletal   Abdominal   Peds  Hematology  (+) anemia ,   Anesthesia Other Findings Past Medical History: No date: Anemia No date: Anxiety No date: Asthma     Comment:  seasonal 2014: Brain tumor ([place])     Comment:  tx with radiation No date: Cancer ([place]) 9417: Complication of anesthesia     Comment:  lung collapse after surgery for gallbladder No date: Depression No date: Headache     Comment:  migraines No date: Hoarseness of voice No date: Hypertension No date: Kidney stone No date: Obesity 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: Pulmonary emboli ([facility]) No date: Radiation     Comment:  Brain tumor No date: UTI (urinary tract infection) No date: Vitamin D deficiency  Past Surgical History: 1999: CHOLECYSTECTOMY 04/08/2017: CYSTOSCOPY W/ URETERAL STENT PLACEMENT; Right     Comment:  Procedure: CYSTOSCOPY WITH RETROGRADE PYELOGRAM/URETERAL              STENT PLACEMENT;  Surgeon: Rana Snare, MD;  Location:               [facility];  Service: Urology;  Laterality: Right; No date: CYSTOSCOPY W/ URETEROSCOPY 01/20/2014: CYSTOSCOPY WITH RETROGRADE PYELOGRAM, URETEROSCOPY AND  STENT PLACEMENT; Left     Comment:  Procedure: LEFT URETEROSCOPY WITH STENT PLACEMENT;                 Surgeon: Irine Seal, MD;  Location: [facility];  Service:               Urology;  Laterality: Left; 04/23/2015: CYSTOSCOPY WITH RETROGRADE PYELOGRAM, URETEROSCOPY AND  STENT PLACEMENT; Bilateral     Comment:  Procedure: CYSTOSCOPY WITH BILATERAL RETROGRADE               PYELOGRAM, URETEROSCOPY AND STENT PLACEMENT;  Surgeon:               Alexis Frock, MD;  Location: [facility];  Service: Urology;              Laterality: Bilateral; 04/13/2017: CYSTOSCOPY WITH RETROGRADE PYELOGRAM, URETEROSCOPY AND  STENT PLACEMENT; Right     Comment:  Procedure: CYSTOSCOPY WITH RETROGRADE PYELOGRAM,               URETEROSCOPY AND STENT EXCHANGE;  Surgeon: Alexis Frock, MD;  [facility];  Service: Urology;                Laterality: Right; 01/12/2014: CYSTOSCOPY WITH STENT PLACEMENT; Left     Comment:  Procedure: CYSTOSCOPY WITH STENT PLACEMENT left               retrograde;  Surgeon: Irine Seal, MD;  [facility];  Service: Urology;  Laterality: Left; 01/20/2014: HOLMIUM LASER APPLICATION; Left     Comment:  Procedure: HOLMIUM LASER APPLICATION;  Surgeon: Irine Seal, MD;  [facility];  Service: Urology;                Laterality: Left; 04/23/2015: HOLMIUM LASER APPLICATION; Bilateral     Comment:  Procedure: HOLMIUM LASER APPLICATION;  Surgeon: Alexis Frock, MD;  [facility];  Service: Urology;                Laterality: Bilateral; 04/13/2017: HOLMIUM LASER APPLICATION; Right     Comment:  Procedure: HOLMIUM LASER APPLICATION;  Surgeon: Alexis Frock, MD;  [facility];  Service: Urology;                Laterality: Right; No date: kidney stone removal No date: LITHOTRIPSY 11/13/2016: PARATHYROIDECTOMY; Right     Comment:  Procedure: RIGHT SUPERIOR PARATHYROIDECTOMY;  Surgeon:               Armandina Gemma, MD;  Location: [facility];  Service: General;                Laterality: Right; 2011: surgery for,ectopic pregnancy      Comment:   1 fallopian tube rupture 2011: TUBAL LIGATION  Reproductive/Obstetrics                            Anesthesia Physical Anesthesia Plan  ASA: III  Anesthesia Plan: General LMA   Post-op Pain Management:    Induction:   PONV Risk Score and Plan: 3 and Ondansetron and Midazolam  Airway Management Planned:   Additional Equipment:   Intra-op Plan:   Post-operative Plan:   Informed Consent: I have reviewed the patients History and Physical, chart, labs and discussed the procedure including the risks, benefits and alternatives for the proposed anesthesia with the patient or authorized representative who has indicated his/her understanding and acceptance.   Dental Advisory Given  Plan Discussed with: CRNA  Anesthesia Plan Comments:        Anesthesia Quick Evaluation

## 2018-03-12 NOTE — Anesthesia Procedure Notes (Signed)
Procedure Name: LMA Insertion Date/Time: 03/12/2018 11:28 AM Performed by: Dawayne Cirri I, CRNA Pre-anesthesia Checklist: Patient identified, Patient being monitored, Timeout performed, Emergency Drugs available and Suction available Patient Re-evaluated:Patient Re-evaluated prior to induction Oxygen Delivery Method: Circle system utilized Preoxygenation: Pre-oxygenation with 100% oxygen Induction Type: IV induction Ventilation: Mask ventilation without difficulty LMA: LMA inserted LMA Size: 4.0 Tube type: Oral Number of attempts: 1 Placement Confirmation: positive ETCO2 and breath sounds checked- equal and bilateral Tube secured with: Tape Dental Injury: Teeth and Oropharynx as per pre-operative assessment

## 2018-03-12 NOTE — H&P (Signed)
[facility] [place] NAME: Carol Ortega    MR#:  [id number]  DATE OF BIRTH:  Aug 28, 1971  DATE OF ADMISSION:  03/12/2018  PRIMARY CARE PHYSICIAN: Berkley Harvey, NP   REQUESTING/REFERRING PHYSICIAN: Dr. John Giovanni  CHIEF COMPLAINT:  No chief complaint on file.   HISTORY OF PRESENT ILLNESS:  Carol Ortega  is a 47 y.o. female with a known history of morbidly obese female with past medical history significant for meningioma status post treatment with radiation, asthma not on home oxygen, kidney stones presents to hospital secondary to severe left renal colic status post lithotripsy and stent placement today. Postoperatively, patient complained of difficulty breathing, chest tightness and pleuritic chest pain. EKG is within normal limits. She probably has undiagnosed sleep apnea. Currently requiring 1-2 L supplemental oxygen. Also complaining of bladder spasms. She is not on any home oxygen. Her asthma is well controlled. Being admitted under observation for the same.  PAST MEDICAL HISTORY:   Past Medical History:  Diagnosis Date  . Anemia   . Anxiety   . Asthma    seasonal  . Brain tumor ([place]) 2014   tx with radiation  . Cancer ([place])   . Complication of anesthesia 1999   lung collapse after surgery for gallbladder  . Depression   . Headache    migraines  . Hoarseness of voice   . Hypertension   . Kidney stone   . Obesity   . Parathyroid abnormality ([place])   . Pulmonary emboli ([place]) 2011  . Radiation    Brain tumor  . UTI (urinary tract infection)   . Vitamin D deficiency     PAST SURGICAL HISTORY:   Past Surgical History:  Procedure Laterality Date  . CHOLECYSTECTOMY  1999  . CYSTOSCOPY W/ RETROGRADES Left 03/01/2018   Procedure: CYSTOSCOPY WITH RETROGRADE PYELOGRAM;  Surgeon: Abbie Sons, MD;  Location: [facility];  Service: Urology;  Laterality: Left;  . CYSTOSCOPY W/ URETERAL STENT PLACEMENT Right 04/08/2017     Procedure: CYSTOSCOPY WITH RETROGRADE PYELOGRAM/URETERAL STENT PLACEMENT;  Surgeon: Rana Snare, MD;  [facility];  Service: Urology;  Laterality: Right;  . CYSTOSCOPY W/ URETEROSCOPY    . CYSTOSCOPY WITH RETROGRADE PYELOGRAM, URETEROSCOPY AND STENT PLACEMENT Left 01/20/2014   Procedure: LEFT URETEROSCOPY WITH STENT PLACEMENT;  Surgeon: Irine Seal, MD;  [facility];  Service: Urology;  Laterality: Left;  . CYSTOSCOPY WITH RETROGRADE PYELOGRAM, URETEROSCOPY AND STENT PLACEMENT Bilateral 04/23/2015   Procedure: CYSTOSCOPY WITH BILATERAL RETROGRADE PYELOGRAM, URETEROSCOPY AND STENT PLACEMENT;  Surgeon: Alexis Frock, MD;  [facility];  Service: Urology;  Laterality: Bilateral;  . CYSTOSCOPY WITH RETROGRADE PYELOGRAM, URETEROSCOPY AND STENT PLACEMENT Right 04/13/2017   Procedure: CYSTOSCOPY WITH RETROGRADE PYELOGRAM, URETEROSCOPY AND STENT EXCHANGE;  Surgeon: Alexis Frock, MD;  [facility];  Service: Urology;  Laterality: Right;  . CYSTOSCOPY WITH STENT PLACEMENT Left 01/12/2014   Procedure: CYSTOSCOPY WITH STENT PLACEMENT left retrograde;  Surgeon: Irine Seal, MD;  [facility];  Service: Urology;  Laterality: Left;  . CYSTOSCOPY WITH STENT PLACEMENT Left 03/01/2018   Procedure: CYSTOSCOPY WITH STENT PLACEMENT;  Surgeon: Abbie Sons, MD;  Location: [facility];  Service: Urology;  Laterality: Left;  . HOLMIUM LASER APPLICATION Left 07/20/3824   Procedure: HOLMIUM LASER APPLICATION;  Surgeon: Irine Seal, MD;  [facility];  Service: Urology;  Laterality: Left;  . HOLMIUM LASER APPLICATION Bilateral 0/04/3975   Procedure: HOLMIUM LASER APPLICATION;  Surgeon: Alexis Frock,  MD;  [facility];  Service: Urology;  Laterality: Bilateral;  . HOLMIUM LASER APPLICATION Right 01/19/5426   Procedure: HOLMIUM LASER APPLICATION;  Surgeon: Alexis Frock, MD;  [facility];  Service: Urology;  Laterality: Right;  . kidney stone removal    . LITHOTRIPSY    . PARATHYROIDECTOMY  Right 11/13/2016   Procedure: RIGHT SUPERIOR PARATHYROIDECTOMY;  Surgeon: Armandina Gemma, MD;  Location: [facility];  Service: General;  Laterality: Right;  . surgery for,ectopic pregnancy  2011    1 fallopian tube rupture  . TUBAL LIGATION  2011    SOCIAL HISTORY:   Social History   Tobacco Use  . Smoking status: Never Smoker  . Smokeless tobacco: Never Used  Substance Use Topics  . Alcohol use: Yes    Comment: holidays    FAMILY HISTORY:   Family History  Problem Relation Age of Onset  . Bell's palsy Mother   . Heart failure Mother   . Asthma Father   . Hypertension Father     DRUG ALLERGIES:   Allergies  Allergen Reactions  . Bee Venom Anaphylaxis  . Contrast Media [Iodinated Diagnostic Agents] Anaphylaxis  . Eggs Or Egg-Derived Products Anaphylaxis  . Penicillins Hives and Other (See Comments)    Has patient had a PCN reaction causing immediate rash, facial/tongue/throat swelling, SOB or lightheadedness with hypotension: No Has patient had a PCN reaction causing severe rash involving mucus membranes or skin necrosis: No Has patient had a PCN reaction that required hospitalization No Has patient had a PCN reaction occurring within the last 10 years: No If all of the above answers are "NO", then may proceed with Cephalosporin use.  . Latex Rash  . Oxycodone-Acetaminophen Itching    REVIEW OF SYSTEMS:   Review of Systems  Constitutional: Negative for chills, fever, malaise/fatigue and weight loss.  HENT: Negative for ear discharge, ear pain, hearing loss, nosebleeds and tinnitus.   Eyes: Negative for blurred vision, double vision and photophobia.  Respiratory: Positive for shortness of breath. Negative for cough, hemoptysis and wheezing.   Cardiovascular: Positive for chest pain. Negative for palpitations, orthopnea and leg swelling.  Gastrointestinal: Negative for abdominal pain, constipation, diarrhea, heartburn, melena, nausea and vomiting.  Genitourinary: Negative  for dysuria, frequency, hematuria and urgency.       Bladder spasms  Musculoskeletal: Negative for back pain, myalgias and neck pain.  Skin: Negative for rash.  Neurological: Negative for dizziness, tingling, tremors, sensory change, speech change, focal weakness and headaches.  Endo/Heme/Allergies: Does not bruise/bleed easily.  Psychiatric/Behavioral: Negative for depression.    MEDICATIONS AT HOME:   Prior to Admission medications   Medication Sig Start Date End Date Taking? Authorizing Provider  acetaminophen (TYLENOL) 500 MG tablet Take 1,000 mg by mouth every 6 (six) hours as needed for mild pain, moderate pain, fever or headache.    Yes [provider]  albuterol (PROVENTIL HFA;VENTOLIN HFA) 108 (90 Base) MCG/ACT inhaler Inhale 2 puffs into the lungs every 6 (six) hours as needed for wheezing or shortness of breath. 03/04/18  Yes Wieting, Richard, MD  albuterol (PROVENTIL) (2.5 MG/3ML) 0.083% nebulizer solution Take 3 mLs (2.5 mg total) by nebulization every 6 (six) hours as needed for wheezing or shortness of breath. 03/04/18  Yes Wieting, Richard, MD  oxyCODONE-acetaminophen (PERCOCET/ROXICET) 5-325 MG tablet Take 1 tablet by mouth every 6 (six) hours as needed for severe pain. 03/11/18  Yes Stoioff, Ronda Fairly, MD  beclomethasone (QVAR REDIHALER) 80 MCG/ACT inhaler Inhale 1 puff into the  lungs 2 (two) times daily. Patient not taking: Reported on 03/11/2018 03/04/18   Loletha Grayer, MD  ondansetron [facility]) 4 MG tablet Take 1 tablet (4 mg total) by mouth daily as needed for nausea or vomiting. 03/12/18   Stoioff, Ronda Fairly, MD  oxybutynin (DITROPAN) 5 MG tablet Take 1 tablet (5 mg total) by mouth 3 (three) times daily as needed for bladder spasms. 03/12/18   Stoioff, Ronda Fairly, MD  phenazopyridine (PYRIDIUM) 200 MG tablet Take 1 tablet (200 mg total) by mouth 3 (three) times daily with meals as needed (burning and urgency). Patient not taking: Reported on 03/11/2018 03/04/18   Loletha Grayer, MD  tamsulosin (FLOMAX) 0.4 MG CAPS capsule Take 1 capsule (0.4 mg total) by mouth daily. 03/12/18   Stoioff, Ronda Fairly, MD      VITAL SIGNS:  Blood pressure 125/80, pulse 88, temperature (!) 97.1 F (36.2 C), resp. rate 10, SpO2 95 %.  PHYSICAL EXAMINATION:   Physical Exam  GENERAL:  47 y.o.-year-old morbidly obese patient lying in the bed with no acute distress.  EYES: Pupils equal, round, reactive to light and accommodation. No scleral icterus. Extraocular muscles intact.  HEENT: Head atraumatic, normocephalic. Oropharynx and nasopharynx clear.  NECK:  Supple, no jugular venous distention. No thyroid enlargement, no tenderness.  LUNGS: Normal breath sounds bilaterally, no wheezing, rales,rhonchi or crepitation. No use of accessory muscles of respiration. Decreased bibasilar breath sounds CARDIOVASCULAR: S1, S2 normal. No murmurs, rubs, or gallops.No reproducible chest wall tenderness  ABDOMEN: Soft, nontender, nondistended. Bowel sounds present. No organomegaly or mass.  EXTREMITIES: No pedal edema, cyanosis, or clubbing.  NEUROLOGIC: Cranial nerves II through XII are intact. Muscle strength 5/5 in all extremities. Sensation intact. Gait not checked.  PSYCHIATRIC: The patient is alert and oriented x 3.  SKIN: No obvious rash, lesion, or ulcer.   LABORATORY PANEL:   CBC No results for input(s): WBC, HGB, HCT, PLT in the last 168 hours. ------------------------------------------------------------------------------------------------------------------  Chemistries  No results for input(s): NA, K, CL, CO2, GLUCOSE, BUN, CREATININE, CALCIUM, MG, AST, ALT, ALKPHOS, BILITOT in the last 168 hours.  Invalid input(s): GFRCGP ------------------------------------------------------------------------------------------------------------------  Cardiac Enzymes No results for input(s): TROPONINI in the last 168  hours. ------------------------------------------------------------------------------------------------------------------  RADIOLOGY:  No results found.  EKG:   Orders placed or performed during the hospital encounter of 03/12/18  . EKG 12-Lead  . EKG 12-Lead  . EKG 12-Lead  . EKG 12-Lead  . EKG 12-Lead  . EKG 12-Lead    IMPRESSION AND PLAN:   Carol Ortega  is a 47 y.o. female with a known history of morbidly obese female with past medical history significant for meningioma status post treatment with radiation, asthma not on home oxygen, kidney stones presents to hospital secondary to severe left renal colic status post lithotripsy and stent placement today.  1. Chest pain- pleuritic in nature, less likely to be cardiac - check CXR, wean off o2 as tolerated - troponins, tele monitor- off unit is fine - prn nebs -incentive spirometer - EKG is within normal limits  2. Recurrent renal stones- s/p left renal stone lithotripsy and stent placement - mgmt per urology - bladder spasms- bell[place] suppositories prn and oxybutynin  3. Asthma-  stable, prn nebs - will need sleep study as outpatient- discussed with patient  4. DVT Prophylaxis- lovenox    All the records are reviewed and case discussed with ED provider. Management plans discussed with the patient, family and they are in agreement.  CODE STATUS: Full  Code  TOTAL TIME TAKING CARE OF THIS PATIENT: 50 minutes.    Gladstone Lighter M.D on 03/12/2018 at 4:03 PM  Between 7am to 6pm - Pager - [phone]  After 6pm go to [email or web address] - password [email or web address] [facility]  Office  [phone]  CC: Primary care physician; Berkley Harvey, NP

## 2018-03-12 NOTE — Op Note (Signed)
Preoperative diagnosis:  1. Left nephrolithiasis  Postoperative diagnosis:  1. Left nephrolithiasis  Procedure: 1. Cystoscopy with stent removal 2. Left ureteroscopy with stone extraction 3. Ureteroscopic laser lithotripsy 4. Left retrograde pyelogram with interpretation 5. Placement left ureteral stent  Surgeon: Abbie Sons, MD  Anesthesia: General  Complications: None  Intraoperative findings:  Endoscopic findings: No bladder mucosal abnormalities, left lower calyceal and mid calyceal calculi  Left retrograde pyelogram: Performed post procedure no filling defects or stone fragments identified.  No extravasation noted.  EBL: Minimal  Specimens: None  Indication: Carol Ortega is a 47 y.o. patient admitted on 02/28/2018 with severe left renal colic secondary to a 10 mm left proximal ureteral calculus and underwent placement of a left ureteral stent for pain control.  She also has an 11 mm left lower pole calculus.  She recently developed fairly significant stent symptoms.  She presents today for definitive stone management..  After reviewing the management options for treatment, he elected to proceed with the above surgical procedure(s). We have discussed the potential benefits and risks of the procedure, side effects of the proposed treatment, the likelihood of the patient achieving the goals of the procedure, and any potential problems that might occur during the procedure or recuperation. Informed consent has been obtained.  Description of procedure:  The patient was taken to the operating room and general anesthesia was induced.  The patient was placed in the dorsal lithotomy position, prepped and draped in the usual sterile fashion, and preoperative antibiotics were administered. A preoperative time-out was performed.   A 22 French cystoscope was lubricated and passed per urethra.  Panendoscopy was performed and the bladder mucosa showed no erythema, solid or papillary  lesions.  There were mild inflammatory changes surrounding the left ureteral orifice.  The ureteral stent was easily identified, grasped with endoscopic forceps and brought out to the urethral meatus.  A 0.038 Sensor wire was placed through the stent and advanced through the left renal pelvis under fluoroscopic guidance.  The renal calculi could be easily visualized on fluoroscopy.  The cystoscope was removed.  A dual-lumen catheter was placed over the guidewire and a second 0.038 working wire was placed.  The dual-lumen catheter was removed and a short ureteral access sheath was placed over the working wire under fluoroscopic guidance and advanced to the proximal ureter.  The inner stylette and working wire were removed.  A dual channel digital flexible ureteroscope was then placed through the access sheath and advanced into the renal pelvis.  The calyceal calculi were easily identified.  All calyces were examined and no other stones or abnormalities were identified.  The larger mid calyceal calculus was not snared and a 1.9 French tipless nitinol basket and transferred to an upper pole calyx.  A 273 m holmium laser fiber was placed through the ureteroscope and the stone was dusted at settings of 0.2 J / 40 Hz.  Larger fragments were removed with the tipless basket until no significantly sized stone fragments were remaining.  The lower pole calyceal calculus was transferred to the same location and fragmented in a similar fashion with all significant fragments basketed.  Retrograde pyelogram was then performed with findings as described above.  All calyces were examined and no significant size stone fragments were identified.  The ureteroscope and access sheath were removed in tandem and the proximal, mid and distal ureter was normal in appearance.    A 6 French/22 cm double-J ureteral stent was placed over the safety wire  under fluoroscopic guidance.  There was good curl seen in the bladder under fluoroscopy.   The proximal tip of the ureteral stent was in an upper pole calyx.  The stent was left attached with tether and will be removed in 7 days.  After anesthetic reversal the patient was transported to the PACU in stable condition.  Abbie Sons, M.D.

## 2018-03-12 NOTE — OR Nursing (Signed)
"  I don't have anyone here to take me home, and no one to stay with me.  I would like to stay in the hospital like I did the last time."

## 2018-03-12 NOTE — Progress Notes (Signed)
Will transition patient from enoxaparin 40mg  once daily to twice daily based on BMI >40.   Candelaria Stagers, PharmD Pharmacy Resident

## 2018-03-12 NOTE — Transfer of Care (Signed)
Immediate Anesthesia Transfer of Care Note  Patient: Carol Ortega  Procedure(s) Performed: CYSTOSCOPY/URETEROSCOPY/HOLMIUM LASER (Left Ureter) CYSTOSCOPY WITH STENT REPLACEMENT (Left Ureter)  Patient Location: PACU  Anesthesia Type:General  Level of Consciousness: drowsy  Airway & Oxygen Therapy: Patient Spontanous Breathing and Patient connected to face mask oxygen  Post-op Assessment: Report given to RN and Post -op Vital signs reviewed and stable  Post vital signs: Reviewed and stable  Last Vitals:  Vitals Value Taken Time  BP 148/107 03/12/2018  1:39 PM  Temp 36.2 C 03/12/2018  1:32 PM  Pulse 99 03/12/2018  1:40 PM  Resp 13 03/12/2018  1:40 PM  SpO2 99 % 03/12/2018  1:40 PM  Vitals shown include unvalidated device data.  Last Pain:  Vitals:   03/12/18 1332  TempSrc:   PainSc: Asleep         Complications: No apparent anesthesia complications

## 2018-03-12 NOTE — OR Nursing (Addendum)
Called anesthesia regarding elevated BP, check to see if she needs to void, "I'm not worried about that blood pressure."

## 2018-03-12 NOTE — Anesthesia Post-op Follow-up Note (Signed)
Anesthesia QCDR form completed.        

## 2018-03-13 ENCOUNTER — Observation Stay: Payer: Medicaid Other

## 2018-03-13 ENCOUNTER — Encounter: Payer: Self-pay | Admitting: Urology

## 2018-03-13 DIAGNOSIS — N200 Calculus of kidney: Secondary | ICD-10-CM | POA: Diagnosis not present

## 2018-03-13 LAB — CBC
HEMATOCRIT: 37.7 % (ref 35.0–47.0)
Hemoglobin: 11.9 g/dL — ABNORMAL LOW (ref 12.0–16.0)
MCH: 26.4 pg (ref 26.0–34.0)
MCHC: 31.6 g/dL — AB (ref 32.0–36.0)
MCV: 83.4 fL (ref 80.0–100.0)
PLATELETS: 356 10*3/uL (ref 150–440)
RBC: 4.52 MIL/uL (ref 3.80–5.20)
RDW: 15.6 % — AB (ref 11.5–14.5)
WBC: 10.4 10*3/uL (ref 3.6–11.0)

## 2018-03-13 LAB — TROPONIN I

## 2018-03-13 LAB — BASIC METABOLIC PANEL
Anion gap: 10 (ref 5–15)
BUN: 14 mg/dL (ref 6–20)
CHLORIDE: 101 mmol/L (ref 101–111)
CO2: 25 mmol/L (ref 22–32)
CREATININE: 0.79 mg/dL (ref 0.44–1.00)
Calcium: 8.9 mg/dL (ref 8.9–10.3)
GFR calc Af Amer: >60 mL/min (ref >60)
GFR calc non Af Amer: >60 mL/min (ref >60)
GLUCOSE: 149 mg/dL — AB (ref 65–99)
POTASSIUM: 3.7 mmol/L (ref 3.5–5.1)
Sodium: 136 mmol/L (ref 135–145)

## 2018-03-13 LAB — CULTURE, URINE COMPREHENSIVE

## 2018-03-13 NOTE — Progress Notes (Signed)
Patient was placed in observation for bladder spasms after ureteroscopic stone removal yesterday.  I was called earlier this morning for continuous urinary incontinence.  A KUB was ordered.  The stent is still in good position of the kidney however the distal end of the stent appears to be at the urethra.  Exam: The stent partially visualized at the urethral meatus.   The stent string was removed and the stent was pushed back into the bladder with hemostats without difficulty.  Impression: Status post ureteroscopic stone removal.  She states her bladder spasms are much better.   Plan: Will discharge and schedule cystoscopy with stent removal next week.

## 2018-03-13 NOTE — Anesthesia Postprocedure Evaluation (Signed)
Anesthesia Post Note  Patient: Carol Ortega  Procedure(s) Performed: CYSTOSCOPY/URETEROSCOPY/HOLMIUM LASER (Left Ureter) CYSTOSCOPY WITH STENT REPLACEMENT (Left Ureter)  Patient location during evaluation: PACU Anesthesia Type: General Level of consciousness: awake and alert Pain management: pain level controlled Vital Signs Assessment: post-procedure vital signs reviewed and stable Respiratory status: spontaneous breathing, nonlabored ventilation, respiratory function stable and patient connected to nasal cannula oxygen Cardiovascular status: blood pressure returned to baseline and stable Postop Assessment: no apparent nausea or vomiting Anesthetic complications: no Comments: Pt had c/o SOB and mid-chest pain, radiating to back. States felt this last time had cysto week ago. EKG from that time and EKG today are normal/unchanged. Pt states "feels like pleurisy, doesn't think it's heart." Resolved somewhat during time in PACU, but not completely gone. Discussed admission w cardiac work up, pt feels "should". Also remarked that she didn't have a ride home and didn't have any support at home, so felt she should stay overnight.     Last Vitals:  Vitals:   03/12/18 2031 03/13/18 0358  BP: 100/70 124/73  Pulse: 79 79  Resp: 20 20  Temp: 36.6 C 36.8 C  SpO2: 97% 98%    Last Pain:  Vitals:   03/13/18 0522  TempSrc:   PainSc: Asleep                 Alphonsus Sias

## 2018-03-13 NOTE — Progress Notes (Signed)
Notified dr.stoioff of pt c/o urinary inc after stent placement. Acknowledged and orders for a kub and will address in the am.

## 2018-03-13 NOTE — Discharge Summary (Signed)
[facility] NAME: Carol Ortega    MR#:  [id number]  DATE OF BIRTH:  08/21/1971  DATE OF ADMISSION:  03/12/2018   ADMITTING PHYSICIAN: Abbie Sons, MD  DATE OF DISCHARGE:03/13/2018 PRIMARY CARE PHYSICIAN: Berkley Harvey, NP   ADMISSION DIAGNOSIS:  left nephrolithiasis DISCHARGE DIAGNOSIS:  Active Problems:   Bladder spasms  SECONDARY DIAGNOSIS:   Past Medical History:  Diagnosis Date  . Anemia   . Anxiety   . Asthma    seasonal  . Brain tumor ([place]) 2014   tx with radiation  . Cancer ([place])   . Complication of anesthesia 1999   lung collapse after surgery for gallbladder  . Depression   . Headache    migraines  . Hoarseness of voice   . Hypertension   . Kidney stone   . Obesity   . Parathyroid abnormality ([place])   . Pulmonary emboli ([place]) 2011  . Radiation    Brain tumor  . UTI (urinary tract infection)   . Vitamin D deficiency    HOSPITAL COURSE:  Carol Ortega  is a 47 y.o. female with a known history of morbidly obese female with past medical history significant for meningioma status post treatment with radiation, asthma not on home oxygen, kidney stones presents to hospital secondary to severe left renal colic status post lithotripsy and stent placement today.  1. Chest pain- pleuritic in nature, less likely to be cardiac, resolved. Normal troponins, EKG is within normal limits.   2. Recurrent renal stones- s/p left renal stone lithotripsy and stent placement - mgmt per urology - bladder spasms- bell[place]ona suppositories prn and oxybutynin, f/u Dr. Bernardo Heater as outpatient.  3. Asthma-  stable, prn nebs 4. Morbid obesity, she will need sleep study as outpatient- discussed with patient. I advised exercise and diet control.  DISCHARGE CONDITIONS:  Stable, discharge to home today. CONSULTS OBTAINED:  Treatment Team:  Gladstone Lighter, MD DRUG ALLERGIES:   Allergies  Allergen Reactions  .  Bee Venom Anaphylaxis  . Contrast Media [Iodinated Diagnostic Agents] Anaphylaxis  . Eggs Or Egg-Derived Products Anaphylaxis  . Penicillins Hives and Other (See Comments)    Has patient had a PCN reaction causing immediate rash, facial/tongue/throat swelling, SOB or lightheadedness with hypotension: No Has patient had a PCN reaction causing severe rash involving mucus membranes or skin necrosis: No Has patient had a PCN reaction that required hospitalization No Has patient had a PCN reaction occurring within the last 10 years: No If all of the above answers are "NO", then may proceed with Cephalosporin use.  . Latex Rash  . Oxycodone-Acetaminophen Itching   DISCHARGE MEDICATIONS:   Allergies as of 03/13/2018      Reactions   Bee Venom Anaphylaxis   Contrast Media [iodinated Diagnostic Agents] Anaphylaxis   Eggs Or Egg-derived Products Anaphylaxis   Penicillins Hives, Other (See Comments)   Has patient had a PCN reaction causing immediate rash, facial/tongue/throat swelling, SOB or lightheadedness with hypotension: No Has patient had a PCN reaction causing severe rash involving mucus membranes or skin necrosis: No Has patient had a PCN reaction that required hospitalization No Has patient had a PCN reaction occurring within the last 10 years: No If all of the above answers are "NO", then may proceed with Cephalosporin use.   Latex Rash   Oxycodone-acetaminophen Itching      Medication List    STOP taking these medications   acetaminophen 500 MG tablet Commonly  known as:  TYLENOL   beclomethasone 80 MCG/ACT inhaler Commonly known as:  QVAR REDIHALER   phenazopyridine 200 MG tablet Commonly known as:  PYRIDIUM     TAKE these medications   albuterol (2.5 MG/3ML) 0.083% nebulizer solution Commonly known as:  PROVENTIL Take 3 mLs (2.5 mg total) by nebulization every 6 (six) hours as needed for wheezing or shortness of breath.   albuterol 108 (90 Base) MCG/ACT  inhaler Commonly known as:  PROVENTIL HFA;VENTOLIN HFA Inhale 2 puffs into the lungs every 6 (six) hours as needed for wheezing or shortness of breath.   ondansetron 4 MG tablet Commonly known as:  ZOFRAN Take 1 tablet (4 mg total) by mouth daily as needed for nausea or vomiting.   oxybutynin 5 MG tablet Commonly known as:  DITROPAN Take 1 tablet (5 mg total) by mouth 3 (three) times daily as needed for bladder spasms.   oxyCODONE-acetaminophen 5-325 MG tablet Commonly known as:  PERCOCET/ROXICET Take 1 tablet by mouth every 6 (six) hours as needed for severe pain.   tamsulosin 0.4 MG Caps capsule Commonly known as:  FLOMAX Take 1 capsule (0.4 mg total) by mouth daily.        DISCHARGE INSTRUCTIONS:  See AVS. If you experience worsening of your admission symptoms, develop shortness of breath, life threatening emergency, suicidal or homicidal thoughts you must seek medical attention immediately by calling 911 or calling your MD immediately  if symptoms less severe.  You Must read complete instructions/literature along with all the possible adverse reactions/side effects for all the Medicines you take and that have been prescribed to you. Take any new Medicines after you have completely understood and accpet all the possible adverse reactions/side effects.   Please note  You were cared for by a hospitalist during your hospital stay. If you have any questions about your discharge medications or the care you received while you were in the hospital after you are discharged, you can call the unit and asked to speak with the hospitalist on call if the hospitalist that took care of you is not available. Once you are discharged, your primary care physician will handle any further medical issues. Please note that NO REFILLS for any discharge medications will be authorized once you are discharged, as it is imperative that you return to your primary care physician (or establish a relationship  with a primary care physician if you do not have one) for your aftercare needs so that they can reassess your need for medications and monitor your lab values.    On the day of Discharge:  VITAL SIGNS:  Blood pressure 124/73, pulse 79, temperature 98.2 F (36.8 C), temperature source Oral, resp. rate 20, height 5\' 6"  (1.676 m), weight (!) 339 lb 11.7 oz (154.1 kg), SpO2 98 %. PHYSICAL EXAMINATION:  GENERAL:  47 y.o.-year-old patient lying in the bed with no acute distress. Morbid obesity. EYES: Pupils equal, round, reactive to light and accommodation. No scleral icterus. Extraocular muscles intact.  HEENT: Head atraumatic, normocephalic. Oropharynx and nasopharynx clear.  NECK:  Supple, no jugular venous distention. No thyroid enlargement, no tenderness.  LUNGS: Normal breath sounds bilaterally, no wheezing, rales,rhonchi or crepitation. No use of accessory muscles of respiration.  CARDIOVASCULAR: S1, S2 normal. No murmurs, rubs, or gallops.  ABDOMEN: Soft, non-tender, non-distended. Bowel sounds present. No organomegaly or mass.  EXTREMITIES: No pedal edema, cyanosis, or clubbing.  NEUROLOGIC: Cranial nerves II through XII are intact. Muscle strength 5/5 in all extremities. Sensation intact.  Gait not checked.  PSYCHIATRIC: The patient is alert and oriented x 3.  SKIN: No obvious rash, lesion, or ulcer.  DATA REVIEW:   CBC Recent Labs  Lab 03/13/18 0427  WBC 10.4  HGB 11.9*  HCT 37.7  PLT 356    Chemistries  Recent Labs  Lab 03/13/18 0427  NA 136  K 3.7  CL 101  CO2 25  GLUCOSE 149*  BUN 14  CREATININE 0.79  CALCIUM 8.9     Microbiology Results  Results for orders placed or performed in visit on 03/11/18  Microscopic Examination     Status: Abnormal   Collection Time: 03/11/18  8:54 AM  Result Value Ref Range Status   WBC, UA 6-10 (A) 0 - 5 /hpf Final   RBC, UA >30 (H) 0 - 2 /hpf Final   Epithelial Cells (non renal) 0-10 0 - 10 /hpf Final   Crystals Present (A)  N/A Final   Crystal Type Calcium Oxalate N/A Final   Mucus, UA Present (A) Not Estab. Final   Bacteria, UA Moderate (A) None seen/Few Final  CULTURE, URINE COMPREHENSIVE     Status: None (Preliminary result)   Collection Time: 03/11/18  9:01 AM  Result Value Ref Range Status   Urine Culture, Comprehensive Preliminary report  Preliminary   Organism ID, Bacteria Comment  Preliminary    Comment: Mixed urogenital flora 10,000-25,000 colony forming units per mL     RADIOLOGY:  Dg Abd 1 View  Result Date: 03/13/2018 CLINICAL DATA:  47 year old female with chest pain. EXAM: ABDOMEN - 1 VIEW; PORTABLE CHEST - 1 VIEW COMPARISON:  Chest radiograph dated 03/02/2018 FINDINGS: Left infrahilar linear atelectasis/scarring. The lungs are otherwise clear. There is no pleural effusion or pneumothorax. The cardiac silhouette is within normal limits. No bowel dilatation or evidence of obstruction. Right upper quadrant cholecystectomy clips. No free air or radiopaque calculi. Pigtail left ureteral stent with distal tip beyond the inferior margin of the image. The osseous structures and soft tissues appear unremarkable. IMPRESSION: 1. No acute cardiopulmonary process. 2. No evidence of bowel obstruction. 3. Pigtail left ureteral stent. No radiopaque calculi over the expected renal silhouette or along the stent. Electronically Signed   By: Anner Crete M.D.   On: 03/13/2018 05:04   Dg Chest Port 1 View  Result Date: 03/13/2018 CLINICAL DATA:  47 year old female with chest pain. EXAM: ABDOMEN - 1 VIEW; PORTABLE CHEST - 1 VIEW COMPARISON:  Chest radiograph dated 03/02/2018 FINDINGS: Left infrahilar linear atelectasis/scarring. The lungs are otherwise clear. There is no pleural effusion or pneumothorax. The cardiac silhouette is within normal limits. No bowel dilatation or evidence of obstruction. Right upper quadrant cholecystectomy clips. No free air or radiopaque calculi. Pigtail left ureteral stent with distal  tip beyond the inferior margin of the image. The osseous structures and soft tissues appear unremarkable. IMPRESSION: 1. No acute cardiopulmonary process. 2. No evidence of bowel obstruction. 3. Pigtail left ureteral stent. No radiopaque calculi over the expected renal silhouette or along the stent. Electronically Signed   By: Anner Crete M.D.   On: 03/13/2018 05:04     Management plans discussed with the patient, family and they are in agreement.  CODE STATUS: Full Code   TOTAL TIME TAKING CARE OF THIS PATIENT: 18 minutes.    Demetrios Loll M.D on 03/13/2018 at 10:07 AM  Between 7am to 6pm - Pager - [phone]  After 6pm go to [email or web address] - [facility]  Office  [phone]  CC: Primary care physician; Berkley Harvey, NP   Note: This dictation was prepared with Dragon dictation along with smaller phrase technology. Any transcriptional errors that result from this process are unintentional.

## 2018-03-15 LAB — STONE ANALYSIS
CA OXALATE, DIHYDRATE: 55 %
CA PHOS CRY STONE QL IR: 35 %
Ca Oxalate,Monohydr.: 10 %
Stone Weight KSTONE: 41 mg

## 2018-03-19 ENCOUNTER — Other Ambulatory Visit: Payer: Self-pay

## 2018-03-19 ENCOUNTER — Emergency Department: Payer: Medicaid Other

## 2018-03-19 ENCOUNTER — Emergency Department
Admission: EM | Admit: 2018-03-19 | Discharge: 2018-03-19 | Disposition: A | Discharge status: Home / Self Care | Payer: Medicaid Other | Attending: Emergency Medicine | Admitting: Emergency Medicine

## 2018-03-19 ENCOUNTER — Encounter: Payer: Self-pay | Admitting: Emergency Medicine

## 2018-03-19 DIAGNOSIS — J45909 Unspecified asthma, uncomplicated: Secondary | ICD-10-CM | POA: Insufficient documentation

## 2018-03-19 DIAGNOSIS — T83592A Infection and inflammatory reaction due to indwelling ureteral stent, initial encounter: Secondary | ICD-10-CM | POA: Insufficient documentation

## 2018-03-19 DIAGNOSIS — R1032 Left lower quadrant pain: Secondary | ICD-10-CM | POA: Insufficient documentation

## 2018-03-19 DIAGNOSIS — I10 Essential (primary) hypertension: Secondary | ICD-10-CM | POA: Insufficient documentation

## 2018-03-19 DIAGNOSIS — Z91040 Latex allergy status: Secondary | ICD-10-CM | POA: Insufficient documentation

## 2018-03-19 DIAGNOSIS — Y69 Unspecified misadventure during surgical and medical care: Secondary | ICD-10-CM | POA: Diagnosis not present

## 2018-03-19 DIAGNOSIS — Z859 Personal history of malignant neoplasm, unspecified: Secondary | ICD-10-CM | POA: Insufficient documentation

## 2018-03-19 DIAGNOSIS — R109 Unspecified abdominal pain: Secondary | ICD-10-CM

## 2018-03-19 LAB — COMPREHENSIVE METABOLIC PANEL
ALK PHOS: 77 U/L (ref 38–126)
ALT: 17 U/L (ref 14–54)
AST: 17 U/L (ref 15–41)
Albumin: 3.6 g/dL (ref 3.5–5.0)
Anion gap: 7 (ref 5–15)
BILIRUBIN TOTAL: 0.7 mg/dL (ref 0.3–1.2)
BUN: 10 mg/dL (ref 6–20)
CALCIUM: 9.4 mg/dL (ref 8.9–10.3)
CO2: 25 mmol/L (ref 22–32)
CREATININE: 0.76 mg/dL (ref 0.44–1.00)
Chloride: 105 mmol/L (ref 101–111)
Glucose, Bld: 107 mg/dL — ABNORMAL HIGH (ref 65–99)
Potassium: 3.8 mmol/L (ref 3.5–5.1)
SODIUM: 137 mmol/L (ref 135–145)
TOTAL PROTEIN: 7.4 g/dL (ref 6.5–8.1)

## 2018-03-19 LAB — CBC
HCT: 37.3 % (ref 35.0–47.0)
Hemoglobin: 11.9 g/dL — ABNORMAL LOW (ref 12.0–16.0)
MCH: 26.9 pg (ref 26.0–34.0)
MCHC: 32 g/dL (ref 32.0–36.0)
MCV: 84 fL (ref 80.0–100.0)
PLATELETS: 343 10*3/uL (ref 150–440)
RBC: 4.44 MIL/uL (ref 3.80–5.20)
RDW: 15.6 % — ABNORMAL HIGH (ref 11.5–14.5)
WBC: 8.2 10*3/uL (ref 3.6–11.0)

## 2018-03-19 LAB — URINALYSIS, COMPLETE (UACMP) WITH MICROSCOPIC
Bacteria, UA: NONE SEEN
Specific Gravity, Urine: 1.017 (ref 1.005–1.030)

## 2018-03-19 MED ORDER — CEPHALEXIN 500 MG PO CAPS: 500.0000 mg | ORAL_CAPSULE | Freq: Four times a day (QID) | ORAL | 0 refills | Status: AC

## 2018-03-19 MED ORDER — HYDROMORPHONE HCL 1 MG/ML IJ SOLN
1.0000 mg | Freq: Once | INTRAMUSCULAR | Status: AC
  Administered 2018-03-19: 1 mg via INTRAVENOUS
  Filled 2018-03-19: qty 1

## 2018-03-19 MED ORDER — HYDROMORPHONE HCL 1 MG/ML IJ SOLN
INTRAMUSCULAR | Status: AC
  Filled 2018-03-19: qty 1

## 2018-03-19 MED ORDER — MORPHINE SULFATE (PF) 4 MG/ML IV SOLN
4.0000 mg | Freq: Once | INTRAVENOUS | Status: AC
  Administered 2018-03-19: 4 mg via INTRAVENOUS
  Filled 2018-03-19: qty 1

## 2018-03-19 MED ORDER — HYDROCODONE-ACETAMINOPHEN 7.5-300 MG PO TABS
1.0000 | ORAL_TABLET | Freq: Two times a day (BID) | ORAL | 0 refills | Status: AC
Start: 2018-03-19 — End: 2018-03-22

## 2018-03-19 MED ORDER — ONDANSETRON HCL 4 MG/2ML IJ SOLN
4.0000 mg | Freq: Once | INTRAMUSCULAR | Status: AC
  Administered 2018-03-19: 4 mg via INTRAVENOUS
  Filled 2018-03-19: qty 2

## 2018-03-19 MED ORDER — HYDROMORPHONE HCL 1 MG/ML IJ SOLN
1.0000 mg | Freq: Once | INTRAMUSCULAR | Status: AC
  Administered 2018-03-19: 1 mg via INTRAVENOUS

## 2018-03-19 MED ORDER — SODIUM CHLORIDE 0.9 % IV SOLN
1.0000 g | Freq: Once | INTRAVENOUS | Status: AC
  Administered 2018-03-19: 1 g via INTRAVENOUS
  Filled 2018-03-19: qty 10

## 2018-03-19 NOTE — ED Provider Notes (Signed)
Patient not in room when I went to see her, apparent lwbs   Lavonia Drafts, MD 03/19/18 [phone]

## 2018-03-19 NOTE — ED Notes (Signed)
Upon triage Pt breathing normally. Pt c/o of pain associated with Nausea. Pt able to make urine, while urinating increasing pain across back.

## 2018-03-19 NOTE — ED Notes (Signed)
Patient transported to CT 

## 2018-03-19 NOTE — ED Triage Notes (Signed)
Pt arrived tot he ED for complaints of left flank pain radiating to the her back. Pt states that 3 weeks ago she was diagnosed with kidney stones that were broken by a procedure about a week ago and a stent was put in place. Pt reports that the pain does not improve even with pain medication and at this point she can not tolerate the pain. Pt is AOx4 in moderate pain.

## 2018-03-19 NOTE — ED Provider Notes (Signed)
[facility] Emergency Department Provider Note       Time seen: ----------------------------------------- 8:54 AM on 03/19/2018 -----------------------------------------   I have reviewed the triage vital signs and the nursing notes.  HISTORY   Chief Complaint Flank Pain    HPI [name] is a 47 y.o. female with a history of anemia, anxiety, asthma, brain tumor, cancer, depression, headache, hypertension, kidney stones, PE with recent ureteral stent who presents to the ED for flank pain that radiates into her back.  Patient states 3 weeks ago Carol Ortega was diagnosed with kidney stones and underwent lithotripsy.  Patient states a stent was put in place.  Carol Ortega reports the pain is not improving with pain medication at this point Carol Ortega cannot tolerate the pain.  Carol Ortega denies fevers, chills or other complaints.  Past Medical History:  Diagnosis Date  . Anemia   . Anxiety   . Asthma    seasonal  . Brain tumor ([place]) 2014   tx with radiation  . Cancer ([place])   . Complication of anesthesia 1999   lung collapse after surgery for gallbladder  . Depression   . Headache    migraines  . Hoarseness of voice   . Hypertension   . Kidney stone   . Obesity   . Parathyroid abnormality ([place])   . Pulmonary emboli ([place]) 2011  . Radiation    Brain tumor  . UTI (urinary tract infection)   . Vitamin D deficiency     Patient Active Problem List   Diagnosis Date Noted  . Bladder spasms 03/12/2018  . Ureteral stone with hydronephrosis 02/28/2018  . Kidney stone 04/09/2017  . Hyperparathyroidism ([place]) 09/10/2015  . Vitamin D deficiency 06/25/2014  . Hypercalcemia 02/09/2014  . 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 CHOLECYSTECTOMY  1999  . CYSTOSCOPY W/ RETROGRADES Left 03/01/2018   Procedure: CYSTOSCOPY WITH RETROGRADE PYELOGRAM;  Surgeon: Abbie Sons, MD;  Location: [facility];  Service: Urology;   Laterality: Left;  . CYSTOSCOPY W/ URETERAL STENT PLACEMENT Right 04/08/2017   Procedure: CYSTOSCOPY WITH RETROGRADE PYELOGRAM/URETERAL STENT PLACEMENT;  Surgeon: Rana Snare, MD;  Location: [facility];  Service: Urology;  Laterality: Right;  . CYSTOSCOPY W/ URETERAL STENT PLACEMENT Left 03/12/2018   Procedure: CYSTOSCOPY WITH STENT REPLACEMENT;  Surgeon: Abbie Sons, MD;  Location: [facility];  Service: Urology;  Laterality: Left;  . CYSTOSCOPY W/ URETEROSCOPY    . CYSTOSCOPY WITH RETROGRADE PYELOGRAM, URETEROSCOPY AND STENT PLACEMENT Left 01/20/2014   Procedure: LEFT URETEROSCOPY WITH STENT PLACEMENT;  Surgeon: Irine Seal, MD;  Location: [facility];  Service: Urology;  Laterality: Left;  . CYSTOSCOPY WITH RETROGRADE PYELOGRAM, URETEROSCOPY AND STENT PLACEMENT Bilateral 04/23/2015   Procedure: CYSTOSCOPY WITH BILATERAL RETROGRADE PYELOGRAM, URETEROSCOPY AND STENT PLACEMENT;  Surgeon: Alexis Frock, MD;  Location: [facility];  Service: Urology;  Laterality: Bilateral;  . CYSTOSCOPY WITH RETROGRADE PYELOGRAM, URETEROSCOPY AND STENT PLACEMENT Right 04/13/2017   Procedure: CYSTOSCOPY WITH RETROGRADE PYELOGRAM, URETEROSCOPY AND STENT EXCHANGE;  Surgeon: Alexis Frock, MD;  Location: [facility];  Service: Urology;  Laterality: Right;  . CYSTOSCOPY WITH STENT PLACEMENT Left 01/12/2014   Procedure: CYSTOSCOPY WITH STENT PLACEMENT left retrograde;  Surgeon: Irine Seal, MD;  Location: [facility];  Service: Urology;  Laterality: Left;  . CYSTOSCOPY WITH STENT PLACEMENT Left 03/01/2018   Procedure: CYSTOSCOPY WITH STENT PLACEMENT;  Surgeon: Abbie Sons, MD;  Location: [facility];  Service: Urology;  Laterality: Left;  . CYSTOSCOPY/URETEROSCOPY/HOLMIUM LASER/STENT  PLACEMENT Left 03/12/2018   Procedure: CYSTOSCOPY/URETEROSCOPY/HOLMIUM LASER;  Surgeon: Abbie Sons, MD;  Location: [facility];  Service: Urology;  Laterality: Left;  . HOLMIUM LASER APPLICATION Left 03/22/8098   Procedure: HOLMIUM LASER APPLICATION;  Surgeon: Irine Seal, MD;  [facility];  Service: Urology;  Laterality: Left;  . HOLMIUM LASER APPLICATION Bilateral 07/20/3824   Procedure: HOLMIUM LASER APPLICATION;  Surgeon: Alexis Frock, MD;  Location: [facility];  Service: Urology;  Laterality: Bilateral;  . HOLMIUM LASER APPLICATION Right 0/53/9767   Procedure: HOLMIUM LASER APPLICATION;  Surgeon: Alexis Frock, MD;  Location: [facility];  Service: Urology;  Laterality: Right;  . kidney stone removal    . LITHOTRIPSY    . PARATHYROIDECTOMY Right 11/13/2016   Procedure: RIGHT SUPERIOR PARATHYROIDECTOMY;  Surgeon: Armandina Gemma, MD;  Location: [place];  Service: General;  Laterality: Right;  . surgery for,ectopic pregnancy  2011    1 fallopian tube rupture  . TUBAL LIGATION  2011    Allergies Bee venom; Contrast media [iodinated diagnostic agents]; Eggs or egg-derived products; Penicillins; Latex; and Oxycodone-acetaminophen  Social History Social History   Tobacco Use  . Smoking status: Never Smoker  . Smokeless tobacco: Never Used  Substance Use Topics  . Alcohol use: Yes    Comment: holidays  . Drug use: No   Review of Systems Constitutional: Negative for fever. Cardiovascular: Negative for chest pain. Respiratory: Negative for shortness of breath. Gastrointestinal: Positive for flank pain Genitourinary: Negative for dysuria. Musculoskeletal: Negative for back pain. Skin: Negative for rash. Neurological: Negative for headaches, focal weakness or numbness.  All systems negative/normal/unremarkable except as stated in the HPI  ____________________________________________   PHYSICAL EXAM:  VITAL SIGNS: ED Triage Vitals  Enc Vitals Group     BP 03/19/18 0422 (!) 140/95     Pulse Rate 03/19/18 0422 85     Resp 03/19/18 0422 (!) 22     Temp 03/19/18 0422 (!) 97.5 F (36.4 C)     Temp Source 03/19/18 0422 Oral     SpO2 03/19/18 0422 98 %     Weight 03/19/18 0423 (!) 332 lb (150.6 kg)     Height 03/19/18 0423 5\' 6"  (1.676 m)      Head Circumference --      Peak Flow --      Pain Score 03/19/18 0423 10     Pain Loc --      Pain Edu? --      Excl. in [place]? --    Constitutional: Alert and oriented. Well appearing and in no distress. Eyes: Conjunctivae are normal. Normal extraocular movements. ENT   Head: Normocephalic and atraumatic.   Nose: No congestion/rhinnorhea.   Mouth/Throat: Mucous membranes are moist.   Neck: No stridor. Cardiovascular: Normal rate, regular rhythm. No murmurs, rubs, or gallops. Respiratory: Normal respiratory effort without tachypnea nor retractions. Breath sounds are clear and equal bilaterally. No wheezes/rales/rhonchi. Gastrointestinal: Left flank tenderness, no rebound or guarding.  Normal bowel sounds. Musculoskeletal: Nontender with normal range of motion in extremities. No lower extremity tenderness nor edema. Neurologic:  Normal speech and language. No gross focal neurologic deficits are appreciated.  Skin:  Skin is warm, dry and intact. No rash noted. Psychiatric: Mood and affect are normal. Speech and behavior are normal.  ____________________________________________  ED COURSE:  As part of my medical decision making, I reviewed the following data within the [place] History obtained from family if available, nursing notes, old chart and ekg, as well  as notes from prior ED visits. Patient presented for flank pain, we will assess with labs and imaging as indicated at this time.   Procedures ____________________________________________   LABS (pertinent positives/negatives)  Labs Reviewed  COMPREHENSIVE METABOLIC PANEL - Abnormal; Notable for the following components:      Result Value   Glucose, Bld 107 (*)    All other components within normal limits  CBC - Abnormal; Notable for the following components:   Hemoglobin 11.9 (*)    RDW 15.6 (*)    All other components within normal limits  URINALYSIS, COMPLETE (UACMP) WITH MICROSCOPIC -  Abnormal; Notable for the following components:   APPearance HAZY (*)    Glucose, UA   (*)    Value: TEST NOT REPORTED DUE TO COLOR INTERFERENCE OF URINE PIGMENT   Hgb urine dipstick   (*)    Value: TEST NOT REPORTED DUE TO COLOR INTERFERENCE OF URINE PIGMENT   Bilirubin Urine   (*)    Value: TEST NOT REPORTED DUE TO COLOR INTERFERENCE OF URINE PIGMENT   Ketones, ur   (*)    Value: TEST NOT REPORTED DUE TO COLOR INTERFERENCE OF URINE PIGMENT   Protein, ur   (*)    Value: TEST NOT REPORTED DUE TO COLOR INTERFERENCE OF URINE PIGMENT   Nitrite   (*)    Value: TEST NOT REPORTED DUE TO COLOR INTERFERENCE OF URINE PIGMENT   Leukocytes, UA   (*)    Value: TEST NOT REPORTED DUE TO COLOR INTERFERENCE OF URINE PIGMENT   Squamous Epithelial / LPF 0-5 (*)    All other components within normal limits  URINE CULTURE    RADIOLOGY Images were viewed by me  CT renal protocol IMPRESSION: 1. Interval insertion of double-J left-sided nephroureteral stent with persistent mild to moderate left hydronephrosis. Previously described 9 mm stone no longer visualized within the left ureter. There are small stones within the dependent aspect of the left renal collecting system, compatible with interval lithotripsy. 2. Hepatic steatosis.  ____________________________________________  DIFFERENTIAL DIAGNOSIS   Renal colic, pyelonephritis, UTI, muscle strain, shingles  FINAL ASSESSMENT AND PLAN  Flank pain  Plan: The patient had presented for persistent flank pain. Patient's labs are likely reflective of recent ureteral stent placement.  We did send a urine culture and give a dose of IV Rocephin.  Patient CT imaging does not reveal any significant interval change.  Previous renal stone is gone.  I did discuss with her nephrologist he will contact her for close outpatient follow-up.  Carol Ortega is stable for discharge at this time.   Laurence Aly, MD   Note: This note was generated in part or  whole with voice recognition software. Voice recognition is usually quite accurate but there are transcription errors that can and very often do occur. I apologize for any typographical errors that were not detected and corrected.     Earleen Newport, MD 03/19/18 1106

## 2018-03-21 ENCOUNTER — Encounter: Payer: Self-pay | Admitting: Urology

## 2018-03-21 ENCOUNTER — Ambulatory Visit (INDEPENDENT_AMBULATORY_CARE_PROVIDER_SITE_OTHER): Payer: Medicaid Other | Admitting: Urology

## 2018-03-21 VITALS — BP 134/87 | HR 83 | Ht 66.0 in | Wt 339.0 lb

## 2018-03-21 DIAGNOSIS — N200 Calculus of kidney: Secondary | ICD-10-CM | POA: Diagnosis not present

## 2018-03-21 LAB — URINALYSIS, COMPLETE
BILIRUBIN UA: NEGATIVE
GLUCOSE, UA: NEGATIVE
KETONES UA: NEGATIVE
NITRITE UA: NEGATIVE
Urobilinogen, Ur: 1 mg/dL (ref 0.2–1.0)
pH, UA: 5.5 (ref 5.0–7.5)

## 2018-03-21 LAB — URINE CULTURE: SPECIAL REQUESTS: NORMAL

## 2018-03-21 LAB — MICROSCOPIC EXAMINATION: RBC, UA: >30 /hpf — ABNORMAL HIGH (ref 0–2)

## 2018-03-21 MED ORDER — LIDOCAINE HCL 2 % EX GEL: 1.0000 "application " | Freq: Once | CUTANEOUS | Status: DC

## 2018-03-21 NOTE — Progress Notes (Signed)
Indications: Patient is 47 y.o., female who recently underwent left ureteroscopic stone removal on 3/26.    The patient is presenting today for stent removal.  Procedure:  Flexible Cystoscopy with stent removal (47841)  Timeout was performed and the correct patient, procedure and participants were identified.    Description:  The patient was prepped and draped in the usual sterile fashion. Flexible cystosopy was performed.  The stent was visualized, grasped, and removed intact without difficulty. The patient tolerated the procedure well.  A single dose of oral antibiotics was given.  Complications:  None  Plan: She was instructed to call for flank pain post stent removal.  She has a postop follow-up scheduled with me on 04/11/2018.

## 2018-03-22 ENCOUNTER — Telehealth: Payer: Self-pay | Admitting: Urology

## 2018-03-22 NOTE — Telephone Encounter (Signed)
Received a call from the lab in the ED and they wanted you to take a look at her UA culture report that cam back on this patient.   Thanks, Sharyn Lull

## 2018-03-22 NOTE — Progress Notes (Signed)
ED Antimicrobial Stewardship Positive Culture Follow Up   Carol Ortega is an 47 y.o. female who presented to [facility] ([facility] on 03/19/2018 with a chief complaint of flank pain from past kidney stones. Per note, patient had urology appointment yesterday where she had stent removed.     Chief Complaint  Patient presents with  . Flank Pain    Recent Results (from the past 720 hour(s))  Urine Culture     Status: Abnormal   Collection Time: 02/28/18  8:09 PM  Result Value Ref Range Status   Specimen Description   Final    URINE, CATHETERIZED Performed at [facility], [address] [place], [place] [address]    Special Requests   Final    NONE Performed at [facility], [address] [place], [place] [address]    Culture (A)  Final    <10,000 COLONIES/mL INSIGNIFICANT GROWTH Performed at [facility] [address] [place], [place] [address]    Report Status 03/02/2018 FINAL  Final  Microscopic Examination     Status: Abnormal   Collection Time: 03/11/18  8:54 AM  Result Value Ref Range Status   WBC, UA 6-10 (A) 0 - 5 /hpf Final   RBC, UA >30 (H) 0 - 2 /hpf Final   Epithelial Cells (non renal) 0-10 0 - 10 /hpf Final   Crystals Present (A) N/A Final   Crystal Type Calcium Oxalate N/A Final   Mucus, UA Present (A) Not Estab. Final   Bacteria, UA Moderate (A) None seen/Few Final  CULTURE, URINE COMPREHENSIVE     Status: None   Collection Time: 03/11/18  9:01 AM  Result Value Ref Range Status   Urine Culture, Comprehensive Final report  Final   Organism ID, Bacteria Comment  Final    Comment: Mixed urogenital flora 10,000-25,000 colony forming units per mL   Stone analysis     Status: None   Collection Time: 03/12/18 11:38 AM  Result Value Ref Range Status   Color Tan  Final   Size Comment mm Final    Comment: Specimen received as fragments.   Stone Weight KSTONE 41.0 mg Corrected   Nidus No Nidus visualized  Corrected   Ca Oxalate,Dihydrate  55 % Corrected   Ca Oxalate,Monohydr. 10 % Corrected   Ca phos cry stone ql IR 35 % Corrected   Composition Comment  Corrected    Comment: Percentage (Represents the % composition)   Photo Comment  Corrected    Comment: Photograph will follow under separate cover.   Comment: Comment  Corrected    Comment: (NOTE) Physician questions regarding Calculi Analysis contact [facility] at: [phone].    PLEASE NOTE: Comment  Corrected    Comment: (NOTE) Calculi report with photograph will follow via computer, mail or courier delivery.    Disclaimer - Kidney Stone Analysis: Comment  Corrected    Comment: (NOTE) This test was developed and its performance characteristics determined by [facility]. It has not been cleared or approved by the Food and Drug Administration. Performed At: [facility] [place], [place] [id number] Rush Farmer MD IF:[id number] Performed at [facility], [address] [place], [place] [address]   Urine culture     Status: Abnormal   Collection Time: 03/19/18  4:32 AM  Result Value Ref Range Status   Specimen Description   Final    URINE, CLEAN CATCH Performed at [facility], [address], [place], [place] [address]  Special Requests   Final    Normal Performed at [facility], [address] [place], [place] [address]    Culture >=100,000 COLONIES/mL STAPHYLOCOCCUS EPIDERMIDIS (A)  Final   Report Status 03/21/2018 FINAL  Final   Organism ID, Bacteria STAPHYLOCOCCUS EPIDERMIDIS (A)  Final      Susceptibility   Staphylococcus epidermidis - MIC*    CIPROFLOXACIN >=8 RESISTANT Resistant     GENTAMICIN <=0.5 SENSITIVE Sensitive     NITROFURANTOIN <=16 SENSITIVE Sensitive     OXACILLIN >=4 RESISTANT Resistant     TETRACYCLINE >=16 RESISTANT Resistant     VANCOMYCIN 2 SENSITIVE Sensitive     TRIMETH/SULFA <=10 SENSITIVE Sensitive     CLINDAMYCIN <=0.25 SENSITIVE Sensitive     RIFAMPIN <=0.5 SENSITIVE  Sensitive     Inducible Clindamycin NEGATIVE Sensitive     * >=100,000 COLONIES/mL STAPHYLOCOCCUS EPIDERMIDIS  Microscopic Examination     Status: Abnormal   Collection Time: 03/21/18  9:42 AM  Result Value Ref Range Status   WBC, UA 6-10 (A) 0 - 5 /hpf Final   RBC, UA >30 (H) 0 - 2 /hpf Final   Epithelial Cells (non renal) 0-10 0 - 10 /hpf Final   Mucus, UA Present (A) Not Estab. Final   Bacteria, UA Moderate (A) None seen/Few Final    [x]  Treated with cephalexin, organism resistant to prescribed antimicrobial  New antibiotic prescription: [place] and spoke to staff who will send message to Dr. Bernardo Heater regarding urine culture followup.    Candelaria Stagers, PharmD Pharmacy Resident  03/22/2018, 10:09 AM

## 2018-03-22 NOTE — Telephone Encounter (Signed)
She was seen yesterday and was asymptomatic.  This is most likely a contaminant and would not treat unless she develops symptoms.

## 2018-03-30 ENCOUNTER — Other Ambulatory Visit: Payer: Self-pay

## 2018-03-30 ENCOUNTER — Encounter (HOSPITAL_COMMUNITY): Payer: Self-pay | Admitting: Emergency Medicine

## 2018-03-30 ENCOUNTER — Emergency Department (HOSPITAL_COMMUNITY)
Admission: EM | Admit: 2018-03-30 | Discharge: 2018-03-30 | Disposition: A | Discharge status: Home / Self Care | Payer: Medicaid Other | Attending: Emergency Medicine | Admitting: Emergency Medicine

## 2018-03-30 DIAGNOSIS — R2241 Localized swelling, mass and lump, right lower limb: Secondary | ICD-10-CM | POA: Insufficient documentation

## 2018-03-30 DIAGNOSIS — E213 Hyperparathyroidism, unspecified: Secondary | ICD-10-CM | POA: Diagnosis not present

## 2018-03-30 DIAGNOSIS — J45909 Unspecified asthma, uncomplicated: Secondary | ICD-10-CM | POA: Diagnosis not present

## 2018-03-30 DIAGNOSIS — Z85841 Personal history of malignant neoplasm of brain: Secondary | ICD-10-CM | POA: Diagnosis not present

## 2018-03-30 DIAGNOSIS — F419 Anxiety disorder, unspecified: Secondary | ICD-10-CM | POA: Insufficient documentation

## 2018-03-30 DIAGNOSIS — Z9049 Acquired absence of other specified parts of digestive tract: Secondary | ICD-10-CM | POA: Diagnosis not present

## 2018-03-30 DIAGNOSIS — M7989 Other specified soft tissue disorders: Secondary | ICD-10-CM

## 2018-03-30 DIAGNOSIS — Z79899 Other long term (current) drug therapy: Secondary | ICD-10-CM | POA: Insufficient documentation

## 2018-03-30 DIAGNOSIS — I10 Essential (primary) hypertension: Secondary | ICD-10-CM | POA: Insufficient documentation

## 2018-03-30 DIAGNOSIS — Z91040 Latex allergy status: Secondary | ICD-10-CM | POA: Insufficient documentation

## 2018-03-30 DIAGNOSIS — F329 Major depressive disorder, single episode, unspecified: Secondary | ICD-10-CM | POA: Diagnosis not present

## 2018-03-30 MED ORDER — ENOXAPARIN SODIUM 300 MG/3ML IJ SOLN: 1.0000 mg/kg | Freq: Once | INTRAMUSCULAR | Status: DC

## 2018-03-30 MED ORDER — ENOXAPARIN SODIUM 80 MG/0.8ML ~~LOC~~ SOLN
155.0000 mg | SUBCUTANEOUS | Status: AC
  Administered 2018-03-30: 155 mg via SUBCUTANEOUS
  Filled 2018-03-30: qty 1.55

## 2018-03-30 NOTE — Discharge Instructions (Addendum)
[facility] at 8:00 AM

## 2018-03-30 NOTE — ED Triage Notes (Signed)
Patient c/o right calf pain onset of yesterday. Pt states surgery about 2 weeks ago. C/o warm and tenderness to calf.

## 2018-03-30 NOTE — ED Provider Notes (Signed)
[facility] DEPT Provider Note   CSN: [id number] Arrival date & time: 03/30/18  2151     History   Chief Complaint Chief Complaint  Patient presents with  . Leg Pain    HPI Carol Ortega is a 47 y.o. female who presents to the ED with cc of R calf pain. The patient states that today she devloped, heat, redness, and swelling of the calf in yesterday that has worsened. She has a hx of previous PE after surgery. She denies cp or SOB. She is status post Cystoscopy on 03/12/2018. She has a hx of cancer.  HPI  Past Medical History:  Diagnosis Date  . Anemia   . Anxiety   . Asthma    seasonal  . Brain tumor ([place]) 2014   tx with radiation  . Cancer ([place])   . Complication of anesthesia 1999   lung collapse after surgery for gallbladder  . Depression   . Headache    migraines  . Hoarseness of voice   . Hypertension   . Kidney stone   . Obesity   . Parathyroid abnormality ([place])   . Pulmonary emboli ([place]) 2011  . Radiation    Brain tumor  . UTI (urinary tract infection)   . Vitamin D deficiency     Patient Active Problem List   Diagnosis Date Noted  . Bladder spasms 03/12/2018  . Ureteral stone with hydronephrosis 02/28/2018  . Kidney stone 04/09/2017  . Hyperparathyroidism ([place]) 09/10/2015  . Vitamin D deficiency 06/25/2014  . Hypercalcemia 02/09/2014  . 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 CHOLECYSTECTOMY  1999  . CYSTOSCOPY W/ RETROGRADES Left 03/01/2018   Procedure: CYSTOSCOPY WITH RETROGRADE PYELOGRAM;  Surgeon: Abbie Sons, MD;  Location: [facility];  Service: Urology;  Laterality: Left;  . CYSTOSCOPY W/ URETERAL STENT PLACEMENT Right 04/08/2017   Procedure: CYSTOSCOPY WITH RETROGRADE PYELOGRAM/URETERAL STENT PLACEMENT;  Surgeon: Rana Snare, MD;  [facility];  Service: Urology;  Laterality: Right;  . CYSTOSCOPY W/ URETERAL STENT PLACEMENT Left 03/12/2018   Procedure: CYSTOSCOPY WITH STENT REPLACEMENT;  Surgeon: Abbie Sons, MD;  Location: [facility];  Service: Urology;  Laterality: Left;  . CYSTOSCOPY W/ URETEROSCOPY    . CYSTOSCOPY WITH RETROGRADE PYELOGRAM, URETEROSCOPY AND STENT PLACEMENT Left 01/20/2014   Procedure: LEFT URETEROSCOPY WITH STENT PLACEMENT;  Surgeon: Irine Seal, MD;  [facility];  Service: Urology;  Laterality: Left;  . CYSTOSCOPY WITH RETROGRADE PYELOGRAM, URETEROSCOPY AND STENT PLACEMENT Bilateral 04/23/2015   Procedure: CYSTOSCOPY WITH BILATERAL RETROGRADE PYELOGRAM, URETEROSCOPY AND STENT PLACEMENT;  Surgeon: Alexis Frock, MD;  [facility];  Service: Urology;  Laterality: Bilateral;  . CYSTOSCOPY WITH RETROGRADE PYELOGRAM, URETEROSCOPY AND STENT PLACEMENT Right 04/13/2017   Procedure: CYSTOSCOPY WITH RETROGRADE PYELOGRAM, URETEROSCOPY AND STENT EXCHANGE;  Surgeon: Alexis Frock, MD;  [facility];  Service: Urology;  Laterality: Right;  . CYSTOSCOPY WITH STENT PLACEMENT Left 01/12/2014   Procedure: CYSTOSCOPY WITH STENT PLACEMENT left retrograde;  Surgeon: Irine Seal, MD;  [facility];  Service: Urology;  Laterality: Left;  . CYSTOSCOPY WITH STENT PLACEMENT Left 03/01/2018   Procedure: CYSTOSCOPY WITH STENT PLACEMENT;  Surgeon: Abbie Sons, MD;  Location: [facility];  Service: Urology;  Laterality: Left;  . CYSTOSCOPY/URETEROSCOPY/HOLMIUM LASER/STENT PLACEMENT Left 03/12/2018   Procedure: CYSTOSCOPY/URETEROSCOPY/HOLMIUM LASER;  Surgeon: Abbie Sons, MD;  Location: [facility];  Service: Urology;  Laterality: Left;  . HOLMIUM LASER APPLICATION Left 02/17/8249  Procedure: HOLMIUM LASER APPLICATION;  Surgeon: Irine Seal, MD;  [facility];  Service: Urology;  Laterality: Left;  . HOLMIUM LASER APPLICATION Bilateral 05/23/4402   Procedure: HOLMIUM LASER APPLICATION;  Surgeon: Alexis Frock, MD;  [facility];  Service: Urology;  Laterality: Bilateral;  . HOLMIUM LASER APPLICATION Right 4/74/2595    Procedure: HOLMIUM LASER APPLICATION;  Surgeon: Alexis Frock, MD;  [facility];  Service: Urology;  Laterality: Right;  . kidney stone removal    . LITHOTRIPSY    . PARATHYROIDECTOMY Right 11/13/2016   Procedure: RIGHT SUPERIOR PARATHYROIDECTOMY;  Surgeon: Armandina Gemma, MD;  Location: [facility];  Service: General;  Laterality: Right;  . surgery for,ectopic pregnancy  2011    1 fallopian tube rupture  . TUBAL LIGATION  2011     OB History   None      Home Medications    Prior to Admission medications   Medication Sig Start Date End Date Taking? Authorizing Provider  albuterol (PROVENTIL HFA;VENTOLIN HFA) 108 (90 Base) MCG/ACT inhaler Inhale 2 puffs into the lungs every 6 (six) hours as needed for wheezing or shortness of breath. 03/04/18   Loletha Grayer, MD  albuterol (PROVENTIL) (2.5 MG/3ML) 0.083% nebulizer solution Take 3 mLs (2.5 mg total) by nebulization every 6 (six) hours as needed for wheezing or shortness of breath. 03/04/18   Loletha Grayer, MD  ondansetron (ZOFRAN) 4 MG tablet Take 1 tablet (4 mg total) by mouth daily as needed for nausea or vomiting. 03/12/18   Stoioff, Ronda Fairly, MD  oxybutynin (DITROPAN) 5 MG tablet Take 1 tablet (5 mg total) by mouth 3 (three) times daily as needed for bladder spasms. 03/12/18   Stoioff, Ronda Fairly, MD  tamsulosin (FLOMAX) 0.4 MG CAPS capsule Take 1 capsule (0.4 mg total) by mouth daily. 03/12/18   Abbie Sons, MD    Family History Family History  Problem Relation Age of Onset  . Bell's palsy Mother   . Heart failure Mother   . Asthma Father   . Hypertension Father     Social History Social History   Tobacco Use  . Smoking status: Never Smoker  . Smokeless tobacco: Never Used  Substance Use Topics  . Alcohol use: Yes    Comment: holidays  . Drug use: No     Allergies   Bee venom; Contrast media [iodinated diagnostic agents]; Eggs or egg-derived products; Penicillins; Latex; and Oxycodone-acetaminophen   Review of  Systems Review of Systems Ten systems reviewed and are negative for acute change, except as noted in the HPI.    Physical Exam Updated Vital Signs BP 116/83 (BP Location: Right Arm)   Pulse 90   Temp 98.1 F (36.7 C) (Oral)   Resp 20   LMP 03/12/2018 (Exact Date)   SpO2 97%   Physical Exam  Constitutional: She is oriented to person, place, and time. She appears well-developed and well-nourished. No distress.  HENT:  Head: Normocephalic and atraumatic.  Eyes: Conjunctivae are normal. No scleral icterus.  Neck: Normal range of motion.  Cardiovascular: Normal rate, regular rhythm and normal heart sounds. Exam reveals no gallop and no friction rub.  No murmur heard. Pulmonary/Chest: Effort normal and breath sounds normal. No respiratory distress.  Abdominal: Soft. Bowel sounds are normal. She exhibits no distension and no mass. There is no tenderness. There is no guarding.  Musculoskeletal:  R calf warm tender and swollen.  Neurological: She is alert and oriented to person, place, and time.  Skin: Skin  is warm and dry. She is not diaphoretic.  Psychiatric: Her behavior is normal.  Nursing note and vitals reviewed.    ED Treatments / Results  Labs (all labs ordered are listed, but only abnormal results are displayed) Labs Reviewed - No data to display  EKG None  Radiology No results found.  Procedures Procedures (including critical care time)  Medications Ordered in ED Medications  enoxaparin (LOVENOX) injection 155 mg (has no administration in time range)     Initial Impression / Assessment and Plan / ED Course  I have reviewed the triage vital signs and the nursing notes.  Pertinent labs & imaging results that were available during my care of the patient were reviewed by me and considered in my medical decision making (see chart for details).     Patient with right calf swelling, warmth, status post surgical procedure and previous history of pulmonary  embolus.  Patient given Lovenox at 1 meg per cake.  She is advised to follow-up tomorrow morning at [facility] for DVT ultrasound.  She appears appropriate for discharge at this time, hemodynamically stable without hypoxia or shortness of breath.  Final Clinical Impressions(s) / ED Diagnoses   Final diagnoses:  Right leg swelling    ED Discharge Orders    None       Margarita Mail, PA-C 03/30/18 2327    Tanna Furry, MD 04/02/18 1445

## 2018-03-31 ENCOUNTER — Emergency Department (HOSPITAL_COMMUNITY)
Admission: EM | Admit: 2018-03-31 | Discharge: 2018-03-31 | Disposition: A | Discharge status: Home / Self Care | Payer: Medicaid Other | Attending: Emergency Medicine | Admitting: Emergency Medicine

## 2018-03-31 ENCOUNTER — Encounter (HOSPITAL_COMMUNITY): Payer: Self-pay | Admitting: Emergency Medicine

## 2018-03-31 ENCOUNTER — Other Ambulatory Visit: Payer: Self-pay

## 2018-03-31 ENCOUNTER — Ambulatory Visit (HOSPITAL_COMMUNITY)
Admission: RE | Admit: 2018-03-31 | Discharge: 2018-03-31 | Disposition: A | Discharge status: Home / Self Care | Payer: Medicaid Other | Source: Ambulatory Visit | Attending: Emergency Medicine | Admitting: Emergency Medicine

## 2018-03-31 DIAGNOSIS — M7989 Other specified soft tissue disorders: Secondary | ICD-10-CM

## 2018-03-31 DIAGNOSIS — Z859 Personal history of malignant neoplasm, unspecified: Secondary | ICD-10-CM | POA: Insufficient documentation

## 2018-03-31 DIAGNOSIS — M79604 Pain in right leg: Secondary | ICD-10-CM | POA: Diagnosis present

## 2018-03-31 DIAGNOSIS — J45909 Unspecified asthma, uncomplicated: Secondary | ICD-10-CM | POA: Diagnosis not present

## 2018-03-31 DIAGNOSIS — I82431 Acute embolism and thrombosis of right popliteal vein: Secondary | ICD-10-CM | POA: Insufficient documentation

## 2018-03-31 DIAGNOSIS — M79609 Pain in unspecified limb: Secondary | ICD-10-CM | POA: Diagnosis not present

## 2018-03-31 DIAGNOSIS — Z91040 Latex allergy status: Secondary | ICD-10-CM | POA: Insufficient documentation

## 2018-03-31 LAB — BASIC METABOLIC PANEL
Anion gap: 11 (ref 5–15)
BUN: 8 mg/dL (ref 6–20)
CALCIUM: 8.8 mg/dL — AB (ref 8.9–10.3)
CO2: 24 mmol/L (ref 22–32)
CREATININE: 0.75 mg/dL (ref 0.44–1.00)
Chloride: 103 mmol/L (ref 101–111)
GFR calc Af Amer: >60 mL/min (ref >60)
GFR calc non Af Amer: >60 mL/min (ref >60)
GLUCOSE: 108 mg/dL — AB (ref 65–99)
Potassium: 3.6 mmol/L (ref 3.5–5.1)
Sodium: 138 mmol/L (ref 135–145)

## 2018-03-31 LAB — CBC WITH DIFFERENTIAL/PLATELET
BASOS ABS: 0 10*3/uL (ref 0.0–0.1)
BASOS PCT: 1 %
EOS PCT: 3 %
Eosinophils Absolute: 0.3 10*3/uL (ref 0.0–0.7)
HEMATOCRIT: 40.5 % (ref 36.0–46.0)
Hemoglobin: 12.8 g/dL (ref 12.0–15.0)
Lymphocytes Relative: 36 %
Lymphs Abs: 2.7 10*3/uL (ref 0.7–4.0)
MCH: 27.3 pg (ref 26.0–34.0)
MCHC: 31.6 g/dL (ref 30.0–36.0)
MCV: 86.4 fL (ref 78.0–100.0)
MONO ABS: 0.8 10*3/uL (ref 0.1–1.0)
MONOS PCT: 11 %
Neutro Abs: 3.7 10*3/uL (ref 1.7–7.7)
Neutrophils Relative %: 49 %
PLATELETS: 334 10*3/uL (ref 150–400)
RBC: 4.69 MIL/uL (ref 3.87–5.11)
RDW: 15.5 % (ref 11.5–15.5)
WBC: 7.5 10*3/uL (ref 4.0–10.5)

## 2018-03-31 MED ORDER — RIVAROXABAN 15 MG PO TABS
15.0000 mg | ORAL_TABLET | Freq: Once | ORAL | Status: AC
  Administered 2018-03-31: 15 mg via ORAL
  Filled 2018-03-31: qty 1

## 2018-03-31 MED ORDER — RIVAROXABAN (XARELTO) VTE STARTER PACK (15 & 20 MG): ORAL_TABLET | ORAL | 0 refills | Status: DC

## 2018-03-31 MED ORDER — ACETAMINOPHEN 325 MG PO TABS
650.0000 mg | ORAL_TABLET | Freq: Once | ORAL | Status: AC
Start: 2018-03-31 — End: 2018-03-31
  Administered 2018-03-31: 650 mg via ORAL
  Filled 2018-03-31: qty 2

## 2018-03-31 NOTE — Progress Notes (Signed)
VASCULAR LAB PRELIMINARY  PRELIMINARY  PRELIMINARY  PRELIMINARY  Right lower extremity venous duplex completed.    Preliminary report:  There is acute DVT noted in the right popliteal vein and acute intramuscular thrombosis noted in the right gastrocnemius vein.  Pawan Knechtel, RVT 03/31/2018, 8:40 AM

## 2018-03-31 NOTE — ED Provider Notes (Signed)
[facility] EMERGENCY DEPARTMENT Provider Note   CSN: [id number] Arrival date & time: 03/31/18  0850     History   Chief Complaint Chief Complaint  Patient presents with  . Leg Pain    positive DVT    HPI Carol Ortega is a 47 y.o. female with past medical history of pulmonary embolism, recurrent nephrolithiasis, hypertension, presenting to the ED with 2 days of right leg swelling and pain. Pt was evaluated in the [facility] last night, and was discharged with outpatient venous ultrasound for this morning for evaluation of DVT.  She reported to that appointment, and was found to have right popliteal DVT. She states she was in the hospital last month for treatment of kidney stones.  She states she has been in bed frequently since that time.  Is not on anticoagulation.  Denies fever or chills, shortness of breath, chest pain. Patient reports history of PE following surgery a few years ago.  The history is provided by the patient and medical records.    Past Medical History:  Diagnosis Date  . Anemia   . Anxiety   . Asthma    seasonal  . Brain tumor ([place]) 2014   tx with radiation  . Cancer ([place])   . Complication of anesthesia 1999   lung collapse after surgery for gallbladder  . Depression   . Headache    migraines  . Hoarseness of voice   . Hypertension   . Kidney stone   . Obesity   . Parathyroid abnormality ([place])   . Pulmonary emboli ([place]) 2011  . Radiation    Brain tumor  . UTI (urinary tract infection)   . Vitamin D deficiency     Patient Active Problem List   Diagnosis Date Noted  . Bladder spasms 03/12/2018  . Ureteral stone with hydronephrosis 02/28/2018  . Kidney stone 04/09/2017  . Hyperparathyroidism ([place]) 09/10/2015  . Vitamin D deficiency 06/25/2014  . Hypercalcemia 02/09/2014  . 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 CHOLECYSTECTOMY  1999  . CYSTOSCOPY W/  RETROGRADES Left 03/01/2018   Procedure: CYSTOSCOPY WITH RETROGRADE PYELOGRAM;  Surgeon: Abbie Sons, MD;  Location: [facility];  Service: Urology;  Laterality: Left;  . CYSTOSCOPY W/ URETERAL STENT PLACEMENT Right 04/08/2017   Procedure: CYSTOSCOPY WITH RETROGRADE PYELOGRAM/URETERAL STENT PLACEMENT;  Surgeon: Rana Snare, MD;  [facility];  Service: Urology;  Laterality: Right;  . CYSTOSCOPY W/ URETERAL STENT PLACEMENT Left 03/12/2018   Procedure: CYSTOSCOPY WITH STENT REPLACEMENT;  Surgeon: Abbie Sons, MD;  Location: [facility];  Service: Urology;  Laterality: Left;  . CYSTOSCOPY W/ URETEROSCOPY    . CYSTOSCOPY WITH RETROGRADE PYELOGRAM, URETEROSCOPY AND STENT PLACEMENT Left 01/20/2014   Procedure: LEFT URETEROSCOPY WITH STENT PLACEMENT;  Surgeon: Irine Seal, MD;  [facility];  Service: Urology;  Laterality: Left;  . CYSTOSCOPY WITH RETROGRADE PYELOGRAM, URETEROSCOPY AND STENT PLACEMENT Bilateral 04/23/2015   Procedure: CYSTOSCOPY WITH BILATERAL RETROGRADE PYELOGRAM, URETEROSCOPY AND STENT PLACEMENT;  Surgeon: Alexis Frock, MD;  [facility];  Service: Urology;  Laterality: Bilateral;  . CYSTOSCOPY WITH RETROGRADE PYELOGRAM, URETEROSCOPY AND STENT PLACEMENT Right 04/13/2017   Procedure: CYSTOSCOPY WITH RETROGRADE PYELOGRAM, URETEROSCOPY AND STENT EXCHANGE;  Surgeon: Alexis Frock, MD;  [facility];  Service: Urology;  Laterality: Right;  . CYSTOSCOPY WITH STENT PLACEMENT Left 01/12/2014   Procedure: CYSTOSCOPY WITH STENT PLACEMENT left retrograde;  Surgeon: Irine Seal, MD;  Location: [facility];  Service: Urology;  Laterality: Left;  . CYSTOSCOPY WITH STENT PLACEMENT Left 03/01/2018   Procedure: CYSTOSCOPY WITH STENT PLACEMENT;  Surgeon: Abbie Sons, MD;  Location: [facility];  Service: Urology;  Laterality: Left;  . CYSTOSCOPY/URETEROSCOPY/HOLMIUM LASER/STENT PLACEMENT Left 03/12/2018   Procedure: CYSTOSCOPY/URETEROSCOPY/HOLMIUM LASER;  Surgeon: Abbie Sons, MD;  Location:  [facility];  Service: Urology;  Laterality: Left;  . HOLMIUM LASER APPLICATION Left 01/22/5808   Procedure: HOLMIUM LASER APPLICATION;  Surgeon: Irine Seal, MD;  [facility];  Service: Urology;  Laterality: Left;  . HOLMIUM LASER APPLICATION Bilateral 08/25/3381   Procedure: HOLMIUM LASER APPLICATION;  Surgeon: Alexis Frock, MD;  [facility];  Service: Urology;  Laterality: Bilateral;  . HOLMIUM LASER APPLICATION Right 04/22/3975   Procedure: HOLMIUM LASER APPLICATION;  Surgeon: Alexis Frock, MD;  [facility];  Service: Urology;  Laterality: Right;  . kidney stone removal    . LITHOTRIPSY    . PARATHYROIDECTOMY Right 11/13/2016   Procedure: RIGHT SUPERIOR PARATHYROIDECTOMY;  Surgeon: Armandina Gemma, MD;  Location: [facility];  Service: General;  Laterality: Right;  . surgery for,ectopic pregnancy  2011    1 fallopian tube rupture  . TUBAL LIGATION  2011     OB History   None      Home Medications    Prior to Admission medications   Medication Sig Start Date End Date Taking? Authorizing Provider  albuterol (PROVENTIL HFA;VENTOLIN HFA) 108 (90 Base) MCG/ACT inhaler Inhale 2 puffs into the lungs every 6 (six) hours as needed for wheezing or shortness of breath. 03/04/18  Yes Wieting, Richard, MD  albuterol (PROVENTIL) (2.5 MG/3ML) 0.083% nebulizer solution Take 3 mLs (2.5 mg total) by nebulization every 6 (six) hours as needed for wheezing or shortness of breath. 03/04/18  Yes Wieting, Richard, MD  diphenhydrAMINE (BENADRYL) 25 MG tablet Take 25 mg by mouth daily as needed for allergies.   Yes [provider]  ibuprofen (ADVIL,MOTRIN) 200 MG tablet Take 800 mg by mouth every 6 (six) hours as needed for moderate pain.   Yes [provider]  ondansetron (ZOFRAN) 4 MG tablet Take 1 tablet (4 mg total) by mouth daily as needed for nausea or vomiting. Patient not taking: Reported on 03/31/2018 03/12/18   Abbie Sons, MD  oxybutynin (DITROPAN) 5 MG tablet Take 1 tablet  (5 mg total) by mouth 3 (three) times daily as needed for bladder spasms. Patient not taking: Reported on 03/31/2018 03/12/18   Abbie Sons, MD  Rivaroxaban 15 & 20 MG TBPK Take as directed on package: Start with one 15mg  tablet by mouth twice a day with food. On Day 22, switch to one 20mg  tablet once a day with food. 03/31/18   Avya Flavell, [place] N, PA-C  tamsulosin (FLOMAX) 0.4 MG CAPS capsule Take 1 capsule (0.4 mg total) by mouth daily. Patient not taking: Reported on 03/31/2018 03/12/18   Abbie Sons, MD    Family History Family History  Problem Relation Age of Onset  . Bell's palsy Mother   . Heart failure Mother   . Asthma Father   . Hypertension Father     Social History Social History   Tobacco Use  . Smoking status: Never Smoker  . Smokeless tobacco: Never Used  Substance Use Topics  . Alcohol use: Yes    Comment: holidays  . Drug use: No     Allergies   Bee venom; Contrast media [iodinated diagnostic agents]; Eggs or egg-derived products; Penicillins; Latex;  and Oxycodone-acetaminophen   Review of Systems Review of Systems  Constitutional: Negative for fever.  Respiratory: Negative for shortness of breath.   Cardiovascular: Positive for leg swelling. Negative for chest pain.  Musculoskeletal: Positive for myalgias.  Neurological: Negative for numbness.  All other systems reviewed and are negative.    Physical Exam Updated Vital Signs BP (!) 127/93   Pulse 82   Temp 97.9 F (36.6 C) (Oral)   Resp 20   LMP 03/12/2018 (Exact Date)   SpO2 100%   Physical Exam  Constitutional: She appears well-developed and well-nourished.  Morbidly obese, well-appearing, not in distress.  HENT:  Head: Normocephalic and atraumatic.  Eyes: Conjunctivae are normal.  Cardiovascular: Normal rate, regular rhythm, normal heart sounds and intact distal pulses.  Pulmonary/Chest: Effort normal and breath sounds normal. No stridor. No respiratory distress. She has no  wheezes. She has no rales.  Abdominal: Soft.  Musculoskeletal:  Right posterior proximal calf and posterior knee with tenderness, warmth, and some swelling.  Intact distal pulses and normal distal sensation.  Neurological: She is alert.  Skin: Skin is warm.  Psychiatric: She has a normal mood and affect. Her behavior is normal.  Nursing note and vitals reviewed.    ED Treatments / Results  Labs (all labs ordered are listed, but only abnormal results are displayed) Labs Reviewed  BASIC METABOLIC PANEL - Abnormal; Notable for the following components:      Result Value   Glucose, Bld 108 (*)    Calcium 8.8 (*)    All other components within normal limits  CBC WITH DIFFERENTIAL/PLATELET    EKG None  Radiology No results found.  Procedures Procedures (including critical care time)  Medications Ordered in ED Medications  acetaminophen (TYLENOL) tablet 650 mg (has no administration in time range)  Rivaroxaban (XARELTO) tablet 15 mg (has no administration in time range)     Initial Impression / Assessment and Plan / ED Course  I have reviewed the triage vital signs and the nursing notes.  Pertinent labs & imaging results that were available during my care of the patient were reviewed by me and considered in my medical decision making (see chart for details).     Patient presenting to the ED with positive right popliteal DVT.  Denies shortness of breath, chest pain, or symptoms of PE.  Vital signs are stable, normal heart rate, normal oxygen saturation, and not tachypneic.  Low suspicion for PE at this time.  Kidney function within normal limits.  Hemoglobin 12.8.  Will discharge with Xarelto and vascular referral for follow-up.  Strict return precautions discussed.  Patient safe for discharge.  Patient discussed with Dr. Vanita Panda.  Discussed results, findings, treatment and follow up. Patient advised of return precautions. Patient verbalized understanding and agreed with  plan.  Final Clinical Impressions(s) / ED Diagnoses   Final diagnoses:  Acute deep vein thrombosis (DVT) of popliteal vein of right lower extremity [facility])    ED Discharge Orders        Ordered    Rivaroxaban 15 & 20 MG TBPK     03/31/18 1243       Aron Needles, [place] N, PA-C 03/31/18 1325    Carmin Muskrat, MD 03/31/18 1554

## 2018-03-31 NOTE — ED Triage Notes (Signed)
Pt. Stated, I have a blood clot in my right lower leg. I had an [place]

## 2018-03-31 NOTE — Discharge Instructions (Addendum)
Please read instructions below. You can take Tylenol every 4-6 hours as needed for pain. You have been given your first dose of Xarelto/rivaroxaban today in the ER. Begin taking this medication as prescribed. Do not take ibuprofen/advil with this medication. Schedule an appointment with the vascular specialist for follow-up. Return to the ER immediately if he began having shortness of breath, chest pain, chest pain with breathing, or new or concerning symptoms.

## 2018-04-01 NOTE — Care Management (Signed)
Post discharge note entry 04/01/18- RNCM received call from patient concern regarding weakness getting to pharmacy to pick up Xarelto. By the time I was able to return patient's call [phone], patient was at [facility] in [place] up her Xarelto.  She has been advised to contact DSS case worker (her Medicaid PCP is Jones in [place]- she now stays in [place]) and ask to change PCP to provider in [place]. They can make a referral to a pulmonologist. Patient agrees. No other concerns per patient.

## 2018-04-03 ENCOUNTER — Emergency Department: Payer: Medicaid Other

## 2018-04-03 ENCOUNTER — Encounter: Payer: Self-pay | Admitting: *Deleted

## 2018-04-03 ENCOUNTER — Emergency Department
Admission: EM | Admit: 2018-04-03 | Discharge: 2018-04-03 | Disposition: A | Discharge status: Home / Self Care | Payer: Medicaid Other | Source: Home / Self Care | Attending: Emergency Medicine | Admitting: Emergency Medicine

## 2018-04-03 ENCOUNTER — Other Ambulatory Visit: Payer: Self-pay

## 2018-04-03 ENCOUNTER — Encounter: Payer: Self-pay | Admitting: Emergency Medicine

## 2018-04-03 ENCOUNTER — Emergency Department
Admission: EM | Admit: 2018-04-03 | Discharge: 2018-04-03 | Disposition: A | Discharge status: Home / Self Care | Payer: Medicaid Other | Attending: Emergency Medicine | Admitting: Emergency Medicine

## 2018-04-03 DIAGNOSIS — M79604 Pain in right leg: Secondary | ICD-10-CM | POA: Insufficient documentation

## 2018-04-03 DIAGNOSIS — R079 Chest pain, unspecified: Secondary | ICD-10-CM | POA: Insufficient documentation

## 2018-04-03 DIAGNOSIS — J45909 Unspecified asthma, uncomplicated: Secondary | ICD-10-CM | POA: Diagnosis not present

## 2018-04-03 DIAGNOSIS — Z85841 Personal history of malignant neoplasm of brain: Secondary | ICD-10-CM | POA: Diagnosis not present

## 2018-04-03 DIAGNOSIS — I10 Essential (primary) hypertension: Secondary | ICD-10-CM | POA: Diagnosis not present

## 2018-04-03 DIAGNOSIS — M79605 Pain in left leg: Secondary | ICD-10-CM

## 2018-04-03 DIAGNOSIS — Z91040 Latex allergy status: Secondary | ICD-10-CM | POA: Diagnosis not present

## 2018-04-03 LAB — BASIC METABOLIC PANEL
ANION GAP: 5 (ref 5–15)
BUN: 12 mg/dL (ref 6–20)
CHLORIDE: 106 mmol/L (ref 101–111)
CO2: 28 mmol/L (ref 22–32)
Calcium: 9.2 mg/dL (ref 8.9–10.3)
Creatinine, Ser: 0.73 mg/dL (ref 0.44–1.00)
GFR calc Af Amer: >60 mL/min (ref >60)
GFR calc non Af Amer: >60 mL/min (ref >60)
GLUCOSE: 85 mg/dL (ref 65–99)
POTASSIUM: 3.6 mmol/L (ref 3.5–5.1)
Sodium: 139 mmol/L (ref 135–145)

## 2018-04-03 LAB — CBC
HEMATOCRIT: 35.3 % (ref 35.0–47.0)
Hemoglobin: 11.5 g/dL — ABNORMAL LOW (ref 12.0–16.0)
MCH: 27.6 pg (ref 26.0–34.0)
MCHC: 32.4 g/dL (ref 32.0–36.0)
MCV: 85.2 fL (ref 80.0–100.0)
Platelets: 329 10*3/uL (ref 150–440)
RBC: 4.15 MIL/uL (ref 3.80–5.20)
RDW: 15.5 % — ABNORMAL HIGH (ref 11.5–14.5)
WBC: 6.2 10*3/uL (ref 3.6–11.0)

## 2018-04-03 LAB — TROPONIN I: Troponin I: <0.03 ng/mL (ref <0.03)

## 2018-04-03 LAB — POCT PREGNANCY, URINE: Preg Test, Ur: NEGATIVE

## 2018-04-03 MED ORDER — OXYCODONE-ACETAMINOPHEN 5-325 MG PO TABS
1.0000 | ORAL_TABLET | Freq: Once | ORAL | Status: AC
  Administered 2018-04-03: 1 via ORAL
  Filled 2018-04-03: qty 1

## 2018-04-03 MED ORDER — DIPHENHYDRAMINE HCL 25 MG PO CAPS: 25.0000 mg | ORAL_CAPSULE | Freq: Once | ORAL | Status: DC

## 2018-04-03 MED ORDER — IOHEXOL 350 MG/ML SOLN
75.0000 mL | Freq: Once | INTRAVENOUS | Status: AC | PRN
  Administered 2018-04-03: 75 mL via INTRAVENOUS

## 2018-04-03 MED ORDER — SODIUM CHLORIDE 0.9 % IV BOLUS
1000.0000 mL | Freq: Once | INTRAVENOUS | Status: AC
  Administered 2018-04-03: 1000 mL via INTRAVENOUS

## 2018-04-03 MED ORDER — HYDROCORTISONE NA SUCCINATE PF 100 MG IJ SOLR
200.0000 mg | Freq: Once | INTRAMUSCULAR | Status: AC
  Administered 2018-04-03: 200 mg via INTRAVENOUS
  Filled 2018-04-03: qty 4

## 2018-04-03 MED ORDER — DIPHENHYDRAMINE HCL 50 MG/ML IJ SOLN: 50.0000 mg | Freq: Once | INTRAMUSCULAR | Status: DC

## 2018-04-03 MED ORDER — DIPHENHYDRAMINE HCL 25 MG PO CAPS
50.0000 mg | ORAL_CAPSULE | Freq: Once | ORAL | Status: AC
  Administered 2018-04-03: 50 mg via ORAL
  Filled 2018-04-03: qty 2

## 2018-04-03 MED ORDER — HYDROCORTISONE NA SUCCINATE PF 250 MG IJ SOLR: 200.0000 mg | Freq: Once | INTRAMUSCULAR | Status: DC

## 2018-04-03 MED ORDER — DIPHENHYDRAMINE HCL 25 MG PO CAPS: 50.0000 mg | ORAL_CAPSULE | Freq: Once | ORAL | Status: DC

## 2018-04-03 NOTE — ED Triage Notes (Signed)
Pt borught in via ems from home with chest pain and dvt to right lower leg.  Pain and swelling worse in right lowe leg. Dx 3 days ago at [facility].   Pt taking xarelto.  Hx of PE  Pt alert.

## 2018-04-03 NOTE — ED Triage Notes (Signed)
FIRST NURSE NOTE-here for left calf pain that feels same as DVT that is in right leg.  Came to make sure not starting to have DVT in left leg. Ambulatory. NAD. Unlabored.

## 2018-04-03 NOTE — ED Provider Notes (Signed)
CT angiogram of the chest is negative, she is cleared for outpatient follow-up.   Earleen Newport, MD 04/03/18 [phone]

## 2018-04-03 NOTE — ED Provider Notes (Signed)
[facility] Emergency Department Provider Note  Time seen: 9:29 PM  I have reviewed the triage vital signs and the nursing notes.   HISTORY  Chief Complaint Leg Pain    HPI Carol Ortega is a 47 y.o. female with a past medical history of prior PE, recent DVT Xarelto, hypertension, presents to the emergency department for left leg pain.  According to the patient yesterday she was seen in the emergency department for right leg pain, was having some chest discomfort as well.  Patient with a recent right lower extremity DVT, had chest discomfort, was seen in the emergency department yesterday had a CT angiography of the chest was negative.  She states since going home she started developing some pain in her left leg and became concerned that she might have a blood clot in her left leg as well.  States some mild pain behind her knee and into her calf of the left leg.  Denies any new chest pain or shortness of breath today.  No fever.   Past Medical History:  Diagnosis Date  . Anemia   . Anxiety   . Asthma    seasonal  . Brain tumor ([place]) 2014   tx with radiation  . Cancer ([place])   . Complication of anesthesia 1999   lung collapse after surgery for gallbladder  . Depression   . Headache    migraines  . Hoarseness of voice   . Hypertension   . Kidney stone   . Obesity   . Parathyroid abnormality ([place])   . Pulmonary emboli ([place]) 2011  . Radiation    Brain tumor  . UTI (urinary tract infection)   . Vitamin D deficiency     Patient Active Problem List   Diagnosis Date Noted  . Bladder spasms 03/12/2018  . Ureteral stone with hydronephrosis 02/28/2018  . Kidney stone 04/09/2017  . Hyperparathyroidism ([place]) 09/10/2015  . Vitamin D deficiency 06/25/2014  . Hypercalcemia 02/09/2014  . 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 CHOLECYSTECTOMY  1999  . CYSTOSCOPY W/ RETROGRADES Left 03/01/2018   Procedure: CYSTOSCOPY WITH RETROGRADE PYELOGRAM;  Surgeon: Abbie Sons, MD;  Location: [facility];  Service: Urology;  Laterality: Left;  . CYSTOSCOPY W/ URETERAL STENT PLACEMENT Right 04/08/2017   Procedure: CYSTOSCOPY WITH RETROGRADE PYELOGRAM/URETERAL STENT PLACEMENT;  Surgeon: Rana Snare, MD;  [facility];  Service: Urology;  Laterality: Right;  . CYSTOSCOPY W/ URETERAL STENT PLACEMENT Left 03/12/2018   Procedure: CYSTOSCOPY WITH STENT REPLACEMENT;  Surgeon: Abbie Sons, MD;  Location: [facility];  Service: Urology;  Laterality: Left;  . CYSTOSCOPY W/ URETEROSCOPY    . CYSTOSCOPY WITH RETROGRADE PYELOGRAM, URETEROSCOPY AND STENT PLACEMENT Left 01/20/2014   Procedure: LEFT URETEROSCOPY WITH STENT PLACEMENT;  Surgeon: Irine Seal, MD;  [facility];  Service: Urology;  Laterality: Left;  . CYSTOSCOPY WITH RETROGRADE PYELOGRAM, URETEROSCOPY AND STENT PLACEMENT Bilateral 04/23/2015   Procedure: CYSTOSCOPY WITH BILATERAL RETROGRADE PYELOGRAM, URETEROSCOPY AND STENT PLACEMENT;  Surgeon: Alexis Frock, MD;  [facility];  Service: Urology;  Laterality: Bilateral;  . CYSTOSCOPY WITH RETROGRADE PYELOGRAM, URETEROSCOPY AND STENT PLACEMENT Right 04/13/2017   Procedure: CYSTOSCOPY WITH RETROGRADE PYELOGRAM, URETEROSCOPY AND STENT EXCHANGE;  Surgeon: Alexis Frock, MD;  [facility];  Service: Urology;  Laterality: Right;  . CYSTOSCOPY WITH STENT PLACEMENT Left 01/12/2014   Procedure: CYSTOSCOPY WITH STENT PLACEMENT left retrograde;  Surgeon: Irine Seal, MD;  [facility];  Service: Urology;  Laterality: Left;  . CYSTOSCOPY WITH STENT PLACEMENT Left 03/01/2018   Procedure: CYSTOSCOPY WITH STENT PLACEMENT;  Surgeon: Abbie Sons, MD;  Location: [facility];  Service: Urology;  Laterality: Left;  . CYSTOSCOPY/URETEROSCOPY/HOLMIUM LASER/STENT PLACEMENT Left 03/12/2018   Procedure: CYSTOSCOPY/URETEROSCOPY/HOLMIUM LASER;  Surgeon: Abbie Sons, MD;  Location: [facility];  Service: Urology;   Laterality: Left;  . HOLMIUM LASER APPLICATION Left 01/22/8526   Procedure: HOLMIUM LASER APPLICATION;  Surgeon: Irine Seal, MD;  [facility];  Service: Urology;  Laterality: Left;  . HOLMIUM LASER APPLICATION Bilateral 06/24/2422   Procedure: HOLMIUM LASER APPLICATION;  Surgeon: Alexis Frock, MD;  [facility];  Service: Urology;  Laterality: Bilateral;  . HOLMIUM LASER APPLICATION Right 5/36/1443   Procedure: HOLMIUM LASER APPLICATION;  Surgeon: Alexis Frock, MD;  [facility];  Service: Urology;  Laterality: Right;  . kidney stone removal    . LITHOTRIPSY    . PARATHYROIDECTOMY Right 11/13/2016   Procedure: RIGHT SUPERIOR PARATHYROIDECTOMY;  Surgeon: Armandina [facility], MD;  Location: [place];  Service: General;  Laterality: Right;  . surgery for,ectopic pregnancy  2011    1 fallopian tube rupture  . TUBAL LIGATION  2011    Prior to Admission medications   Medication Sig Start Date End Date Taking? Authorizing Provider  albuterol (PROVENTIL HFA;VENTOLIN HFA) 108 (90 Base) MCG/ACT inhaler Inhale 2 puffs into the lungs every 6 (six) hours as needed for wheezing or shortness of breath. 03/04/18   Loletha Grayer, MD  albuterol (PROVENTIL) (2.5 MG/3ML) 0.083% nebulizer solution Take 3 mLs (2.5 mg total) by nebulization every 6 (six) hours as needed for wheezing or shortness of breath. 03/04/18   Loletha Grayer, MD  diphenhydrAMINE (BENADRYL) 25 MG tablet Take 25 mg by mouth daily as needed for allergies.    [provider]  ibuprofen (ADVIL,MOTRIN) 200 MG tablet Take 800 mg by mouth every 6 (six) hours as needed for moderate pain.    [provider]  ondansetron (ZOFRAN) 4 MG tablet Take 1 tablet (4 mg total) by mouth daily as needed for nausea or vomiting. Patient not taking: Reported on 03/31/2018 03/12/18   Abbie Sons, MD  oxybutynin (DITROPAN) 5 MG tablet Take 1 tablet (5 mg total) by mouth 3 (three) times daily as needed for bladder spasms. Patient not taking:  Reported on 03/31/2018 03/12/18   Abbie Sons, MD  Rivaroxaban 15 & 20 MG TBPK Take as directed on package: Start with one 15mg  tablet by mouth twice a day with food. On Day 22, switch to one 20mg  tablet once a day with food. 03/31/18   Robinson, [place] N, PA-C  tamsulosin (FLOMAX) 0.4 MG CAPS capsule Take 1 capsule (0.4 mg total) by mouth daily. Patient not taking: Reported on 03/31/2018 03/12/18   Abbie Sons, MD    Allergies  Allergen Reactions  . Bee Venom Anaphylaxis  . Contrast Media [Iodinated Diagnostic Agents] Anaphylaxis  . Eggs Or Egg-Derived Products Anaphylaxis  . Penicillins Hives and Other (See Comments)    Has patient had a PCN reaction causing immediate rash, facial/tongue/throat swelling, SOB or lightheadedness with hypotension: No Has patient had a PCN reaction causing severe rash involving mucus membranes or skin necrosis: No Has patient had a PCN reaction that required hospitalization No Has patient had a PCN reaction occurring within the last 10 years: No If all of the above answers are "NO", then may proceed with Cephalosporin use.  . Latex Rash  . Oxycodone-Acetaminophen  Itching    Family History  Problem Relation Age of Onset  . Bell's palsy Mother   . Heart failure Mother   . Asthma Father   . Hypertension Father     Social History Social History   Tobacco Use  . Smoking status: Never Smoker  . Smokeless tobacco: Never Used  Substance Use Topics  . Alcohol use: Yes    Comment: holidays  . Drug use: No    Review of Systems Constitutional: Negative for fever. Eyes: Negative for visual complaints ENT: Negative for recent illness/congestion Cardiovascular: Negative for chest pain. Respiratory: Negative for shortness of breath. Gastrointestinal: Negative for abdominal pain Genitourinary: Negative for urinary compaints Musculoskeletal: Left leg pain Skin: Negative for skin complaints  Neurological: Negative for headache All other ROS  negative  ____________________________________________   PHYSICAL EXAM:  VITAL SIGNS: ED Triage Vitals  Enc Vitals Group     BP 04/03/18 1832 135/84     Pulse Rate 04/03/18 1832 86     Resp 04/03/18 1832 16     Temp 04/03/18 1832 98.6 F (37 C)     Temp Source 04/03/18 1832 Oral     SpO2 04/03/18 1832 98 %     Weight 04/03/18 1830 (!) 339 lb (153.8 kg)     Height 04/03/18 1830 5\' 6"  (1.676 m)     Head Circumference --      Peak Flow --      Pain Score 04/03/18 1830 7     Pain Loc --      Pain Edu? --      Excl. in [place]? --     Constitutional: Alert and oriented. Well appearing and in no distress. Eyes: Normal exam ENT   Head: Normocephalic and atraumatic.   Mouth/Throat: Mucous membranes are moist. Cardiovascular: Normal rate, regular rhythm. No murmur Respiratory: Normal respiratory effort without tachypnea nor retractions. Breath sounds are clear  Gastrointestinal: Soft and nontender. No distention.  Musculoskeletal: Mild tenderness behind the left knee in the popliteal fossa.  Mild tenderness of the right calf with mild increased swelling. Neurologic:  Normal speech and language. No gross focal neurologic deficits Skin:  Skin is warm, dry and intact.  Psychiatric: Mood and affect are normal  ____________________________________________    RADIOLOGY  Ultrasound negative for DVT.  ____________________________________________   INITIAL IMPRESSION / ASSESSMENT AND PLAN / ED COURSE  Pertinent labs & imaging results that were available during my care of the patient were reviewed by me and considered in my medical decision making (see chart for details).  Patient presents to the emergency department for left leg pain.  Recently diagnosed with a right lower extremity DVT currently on Xarelto.  Negative CT angiography of the chest yesterday.  Differential would include DVT, Baker's cyst, peripheral edema, muscular skeletal pain.  Ultrasound is negative for DVT  today.  Patient is very reassured.  We will discharge the patient home with PCP follow-up.  Patient agreeable to plan.  ____________________________________________   FINAL CLINICAL IMPRESSION(S) / ED DIAGNOSES  Left leg pain    Harvest Dark, MD 04/03/18 2133

## 2018-04-03 NOTE — ED Notes (Signed)

## 2018-04-03 NOTE — ED Notes (Signed)
Pt has chest pain and right lower leg pain.  Dx with dvt 3 days ago.  Hx of PE.  Pt also had kidney surgery 3 weeks ago for kidney stones.  Pt has increased swelling and pain in right lower leg.  Pt taking Xarelto .  Pt also reports a headache today.  Pt alert  Speech clear.

## 2018-04-03 NOTE — ED Triage Notes (Signed)
Pt was seen at [facility] on Sunday where they found a DVT on the right leg. Pt started xarelto. Pt states last night she felt dizzy and called ems who brought her in the ED. They did CT which was clear for PE. Pt states when she woke up today she had pain in the left leg that mimics the DVT pain she experienced in her right leg. Pedal pulses equal and strong.

## 2018-04-03 NOTE — ED Provider Notes (Signed)
[facility] Emergency Department Provider Note   ____________________________________________   First MD Initiated Contact with Patient 04/03/18 0300     (approximate)  I have reviewed the triage vital signs and the nursing notes.   HISTORY  Chief Complaint Chest Pain and Leg Pain    HPI Carol Ortega is a 47 y.o. female brought to the ED from home via EMS with a chief complaint of chest pain and shortness of breath.  She has a history of PE after her ectopic surgery multiple years ago.  More recently she was hospitalized for kidney stones and has been fairly immobile since.  She was seen at [facility] 3 days ago and found to have right DVT; started on Xarelto.  Tonight noted some pains to her chest and subsequently became short of breath.  She is unsure whether or not she was short of breath due to her heightened anxiety.  Denies fever, chills, abdominal pain, nausea, vomiting, diarrhea.  Denies recent travel or trauma.   Past Medical History:  Diagnosis Date  . Anemia   . Anxiety   . Asthma    seasonal  . Brain tumor ([place]) 2014   tx with radiation  . Cancer ([place])   . Complication of anesthesia 1999   lung collapse after surgery for gallbladder  . Depression   . Headache    migraines  . Hoarseness of voice   . Hypertension   . Kidney stone   . Obesity   . Parathyroid abnormality ([place])   . Pulmonary emboli ([place]) 2011  . Radiation    Brain tumor  . UTI (urinary tract infection)   . Vitamin D deficiency     Patient Active Problem List   Diagnosis Date Noted  . Bladder spasms 03/12/2018  . Ureteral stone with hydronephrosis 02/28/2018  . Kidney stone 04/09/2017  . Hyperparathyroidism ([place]) 09/10/2015  . Vitamin D deficiency 06/25/2014  . Hypercalcemia 02/09/2014  . 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 CHOLECYSTECTOMY  1999  . CYSTOSCOPY W/ RETROGRADES Left  03/01/2018   Procedure: CYSTOSCOPY WITH RETROGRADE PYELOGRAM;  Surgeon: Abbie Sons, MD;  Location: [facility];  Service: Urology;  Laterality: Left;  . CYSTOSCOPY W/ URETERAL STENT PLACEMENT Right 04/08/2017   Procedure: CYSTOSCOPY WITH RETROGRADE PYELOGRAM/URETERAL STENT PLACEMENT;  Surgeon: Rana Snare, MD;  [facility];  Service: Urology;  Laterality: Right;  . CYSTOSCOPY W/ URETERAL STENT PLACEMENT Left 03/12/2018   Procedure: CYSTOSCOPY WITH STENT REPLACEMENT;  Surgeon: Abbie Sons, MD;  Location: [facility];  Service: Urology;  Laterality: Left;  . CYSTOSCOPY W/ URETEROSCOPY    . CYSTOSCOPY WITH RETROGRADE PYELOGRAM, URETEROSCOPY AND STENT PLACEMENT Left 01/20/2014   Procedure: LEFT URETEROSCOPY WITH STENT PLACEMENT;  Surgeon: Irine Seal, MD;  [facility];  Service: Urology;  Laterality: Left;  . CYSTOSCOPY WITH RETROGRADE PYELOGRAM, URETEROSCOPY AND STENT PLACEMENT Bilateral 04/23/2015   Procedure: CYSTOSCOPY WITH BILATERAL RETROGRADE PYELOGRAM, URETEROSCOPY AND STENT PLACEMENT;  Surgeon: Alexis Frock, MD;  [facility];  Service: Urology;  Laterality: Bilateral;  . CYSTOSCOPY WITH RETROGRADE PYELOGRAM, URETEROSCOPY AND STENT PLACEMENT Right 04/13/2017   Procedure: CYSTOSCOPY WITH RETROGRADE PYELOGRAM, URETEROSCOPY AND STENT EXCHANGE;  Surgeon: Alexis Frock, MD;  [facility];  Service: Urology;  Laterality: Right;  . CYSTOSCOPY WITH STENT PLACEMENT Left 01/12/2014   Procedure: CYSTOSCOPY WITH STENT PLACEMENT left retrograde;  Surgeon: Irine Seal, MD;  [facility];  Service: Urology;  Laterality: Left;  . CYSTOSCOPY WITH STENT PLACEMENT Left 03/01/2018   Procedure: CYSTOSCOPY WITH STENT PLACEMENT;  Surgeon: Abbie Sons, MD;  Location: [facility];  Service: Urology;  Laterality: Left;  . CYSTOSCOPY/URETEROSCOPY/HOLMIUM LASER/STENT PLACEMENT Left 03/12/2018   Procedure: CYSTOSCOPY/URETEROSCOPY/HOLMIUM LASER;  Surgeon: Abbie Sons, MD;  Location: [facility];   Service: Urology;  Laterality: Left;  . HOLMIUM LASER APPLICATION Left 1/0/2585   Procedure: HOLMIUM LASER APPLICATION;  Surgeon: Irine Seal, MD;  [facility];  Service: Urology;  Laterality: Left;  . HOLMIUM LASER APPLICATION Bilateral 01/24/7823   Procedure: HOLMIUM LASER APPLICATION;  Surgeon: Alexis Frock, MD;  [facility];  Service: Urology;  Laterality: Bilateral;  . HOLMIUM LASER APPLICATION Right 2/35/3614   Procedure: HOLMIUM LASER APPLICATION;  Surgeon: Alexis Frock, MD;  [facility];  Service: Urology;  Laterality: Right;  . kidney stone removal    . LITHOTRIPSY    . PARATHYROIDECTOMY Right 11/13/2016   Procedure: RIGHT SUPERIOR PARATHYROIDECTOMY;  Surgeon: Armandina [facility], MD;  Location: [place];  Service: General;  Laterality: Right;  . surgery for,ectopic pregnancy  2011    1 fallopian tube rupture  . TUBAL LIGATION  2011    Prior to Admission medications   Medication Sig Start Date End Date Taking? Authorizing Provider  albuterol (PROVENTIL HFA;VENTOLIN HFA) 108 (90 Base) MCG/ACT inhaler Inhale 2 puffs into the lungs every 6 (six) hours as needed for wheezing or shortness of breath. 03/04/18   Loletha Grayer, MD  albuterol (PROVENTIL) (2.5 MG/3ML) 0.083% nebulizer solution Take 3 mLs (2.5 mg total) by nebulization every 6 (six) hours as needed for wheezing or shortness of breath. 03/04/18   Loletha Grayer, MD  diphenhydrAMINE (BENADRYL) 25 MG tablet Take 25 mg by mouth daily as needed for allergies.    [provider]  ibuprofen (ADVIL,MOTRIN) 200 MG tablet Take 800 mg by mouth every 6 (six) hours as needed for moderate pain.    [provider]  ondansetron (ZOFRAN) 4 MG tablet Take 1 tablet (4 mg total) by mouth daily as needed for nausea or vomiting. Patient not taking: Reported on 03/31/2018 03/12/18   Abbie Sons, MD  oxybutynin (DITROPAN) 5 MG tablet Take 1 tablet (5 mg total) by mouth 3 (three) times daily as needed for bladder  spasms. Patient not taking: Reported on 03/31/2018 03/12/18   Abbie Sons, MD  Rivaroxaban 15 & 20 MG TBPK Take as directed on package: Start with one 15mg  tablet by mouth twice a day with food. On Day 22, switch to one 20mg  tablet once a day with food. 03/31/18   Robinson, [place] N, PA-C  tamsulosin (FLOMAX) 0.4 MG CAPS capsule Take 1 capsule (0.4 mg total) by mouth daily. Patient not taking: Reported on 03/31/2018 03/12/18   Abbie Sons, MD    Allergies Bee venom; Contrast media [iodinated diagnostic agents]; Eggs or egg-derived products; Penicillins; Latex; and Oxycodone-acetaminophen  Family History  Problem Relation Age of Onset  . Bell's palsy Mother   . Heart failure Mother   . Asthma Father   . Hypertension Father     Social History Social History   Tobacco Use  . Smoking status: Never Smoker  . Smokeless tobacco: Never Used  Substance Use Topics  . Alcohol use: Yes    Comment: holidays  . Drug use: No    Review of Systems  Constitutional: No fever/chills. Eyes: No visual changes. ENT: No sore throat. Cardiovascular: Positive for chest pain. Respiratory: Positive for  shortness of breath. Gastrointestinal: No abdominal pain.  No nausea, no vomiting.  No diarrhea.  No constipation. Genitourinary: Negative for dysuria. Musculoskeletal: Positive for right lower leg swelling and pain.  Negative for back pain. Skin: Negative for rash. Neurological: Negative for headaches, focal weakness or numbness.   ____________________________________________   PHYSICAL EXAM:  VITAL SIGNS: ED Triage Vitals  Enc Vitals Group     BP 04/03/18 0234 130/85     Pulse Rate 04/03/18 0234 84     Resp 04/03/18 0234 20     Temp 04/03/18 0234 98.7 F (37.1 C)     Temp Source 04/03/18 0234 Oral     SpO2 04/03/18 0234 96 %     Weight 04/03/18 0235 (!) 339 lb (153.8 kg)     Height 04/03/18 0235 5\' 6"  (1.676 m)     Head Circumference --      Peak Flow --      Pain Score  04/03/18 0234 6     Pain Loc --      Pain Edu? --      Excl. in [place]? --     Constitutional: Alert and oriented. Well appearing and in no acute distress.  Anxious. Eyes: Conjunctivae are normal. PERRL. EOMI. Head: Atraumatic. Nose: No congestion/rhinnorhea. Mouth/Throat: Mucous membranes are moist.  Oropharynx non-erythematous. Neck: No stridor.  No carotid bruits. Cardiovascular: Normal rate, regular rhythm. Grossly normal heart sounds.  Good peripheral circulation. Respiratory: Normal respiratory effort.  No retractions. Lungs CTAB. Gastrointestinal: Soft and nontender. No distention. No abdominal bruits. No CVA tenderness. Musculoskeletal:  RLE swollen and calf is tender to palpation.  2+ distal pulses.  Brisk, less than 5-second capillary refill.  Symmetrically warm leg without evidence for ischemia. Neurologic:  Normal speech and language. No gross focal neurologic deficits are appreciated.  Skin:  Skin is warm, dry and intact. No rash noted. Psychiatric: Mood and affect are normal. Speech and behavior are normal.  ____________________________________________   LABS (all labs ordered are listed, but only abnormal results are displayed)  Labs Reviewed  CBC - Abnormal; Notable for the following components:      Result Value   Hemoglobin 11.5 (*)    RDW 15.5 (*)    All other components within normal limits  BASIC METABOLIC PANEL  TROPONIN I  POC URINE PREG, ED  POCT PREGNANCY, URINE   ____________________________________________  EKG  ED ECG REPORT I, Krystall Kruckenberg J, the attending physician, personally viewed and interpreted this ECG.   Date: 04/03/2018  EKG Time: 0236  Rate: 77  Rhythm: normal EKG, normal sinus rhythm  Axis: Normal  Intervals:none  ST&T Change: Nonspecific  ____________________________________________  RADIOLOGY  ED MD interpretation: Chest x-ray no acute cardiopulmonary process  Official radiology report(s): Dg Chest 2 View  Result Date:  04/03/2018 CLINICAL DATA:  Chest pain EXAM: CHEST - 2 VIEW COMPARISON:  03/13/2018 FINDINGS: The heart size and mediastinal contours are within normal limits. Both lungs are clear. The visualized skeletal structures are unremarkable. IMPRESSION: No active cardiopulmonary disease. Electronically Signed   By: Donavan Foil M.D.   On: 04/03/2018 03:05    ____________________________________________   PROCEDURES  Procedure(s) performed: None  Procedures  Critical Care performed: No  ____________________________________________   INITIAL IMPRESSION / ASSESSMENT AND PLAN / ED COURSE  As part of my medical decision making, I reviewed the following data within the [place] notes reviewed and incorporated, Labs reviewed, EKG interpreted, Old chart reviewed, Radiograph reviewed and Notes from  prior ED visits   47 year old female with prior history of PE and recently diagnosed DVT on Xarelto who presents with chest pain and shortness of breath. Differential diagnosis includes, but is not limited to, ACS, aortic dissection, pulmonary embolism, cardiac tamponade, pneumothorax, pneumonia, pericarditis, myocarditis, GI-related causes including esophagitis/gastritis, and musculoskeletal chest wall pain.    Laboratory and x-ray results unremarkable.  Will administer Percocet for leg pain.  Patient has allergy to IV dye -she has experienced rash previously but no anaphylaxis.  Will order urgent prep to include Solu-Medrol and Benadryl in preparation for CTA to evaluate pulmonary embolism.      ____________________________________________   FINAL CLINICAL IMPRESSION(S) / ED DIAGNOSES  Final diagnoses:  Right leg pain  Chest pain, unspecified type     ED Discharge Orders    None       Note:  This document was prepared using Dragon voice recognition software and may include unintentional dictation errors.    Paulette Blanch, MD 04/03/18 [phone]

## 2018-04-03 NOTE — ED Notes (Signed)
Pt has pain in left lower leg.  Pt dx with dvt in right leg recently.  Pt states pain started in left lower leg at 1330 today.  No sob. no chest pain .  Pt alert.

## 2018-04-03 NOTE — ED Notes (Signed)
Report off to henry rn  

## 2018-04-08 ENCOUNTER — Encounter (INDEPENDENT_AMBULATORY_CARE_PROVIDER_SITE_OTHER): Payer: Self-pay | Admitting: Vascular Surgery

## 2018-04-11 ENCOUNTER — Encounter: Payer: Self-pay | Admitting: Urology

## 2018-04-11 ENCOUNTER — Telehealth: Payer: Self-pay | Admitting: Urology

## 2018-04-11 ENCOUNTER — Ambulatory Visit: Payer: Medicaid Other | Admitting: Urology

## 2018-04-11 NOTE — Telephone Encounter (Signed)
Pt was a no show for a 1 month post op follow up today.  Just F.Y.I.

## 2018-04-15 ENCOUNTER — Ambulatory Visit (INDEPENDENT_AMBULATORY_CARE_PROVIDER_SITE_OTHER): Payer: Medicaid Other | Admitting: Vascular Surgery

## 2018-04-15 ENCOUNTER — Encounter (INDEPENDENT_AMBULATORY_CARE_PROVIDER_SITE_OTHER): Payer: Self-pay | Admitting: Vascular Surgery

## 2018-04-15 VITALS — BP 139/83 | HR 88 | Resp 16 | Ht 66.0 in | Wt 341.0 lb

## 2018-04-15 DIAGNOSIS — I82409 Acute embolism and thrombosis of unspecified deep veins of unspecified lower extremity: Secondary | ICD-10-CM | POA: Insufficient documentation

## 2018-04-15 DIAGNOSIS — I82431 Acute embolism and thrombosis of right popliteal vein: Secondary | ICD-10-CM | POA: Diagnosis not present

## 2018-04-15 DIAGNOSIS — R600 Localized edema: Secondary | ICD-10-CM | POA: Diagnosis not present

## 2018-04-15 MED ORDER — RIVAROXABAN 20 MG PO TABS: 20.0000 mg | ORAL_TABLET | Freq: Every day | ORAL | 5 refills | Status: DC

## 2018-04-15 NOTE — Progress Notes (Signed)
Subjective:    Patient ID: Carol Ortega, female    DOB: 1971-08-22, 47 y.o.   MRN: [id number] Chief Complaint  Patient presents with  . New Patient (Initial Visit)    evaluate DVT   Presents as a new patient referred by the [facility] emergency department for continued follow-up regarding a right lower extremity DVT.  This is the patient's second DVT.  The patient was diagnosed with her first DVT in 2011 after a lithotripsy for a kidney stone.  The patient is prone to kidney stones.  At that time, the patient was also diagnosed with a PE and was on oral anticoagulation for 2 years.  About 2 weeks ago the patient was diagnosed with a 12 mm kidney stone and had to undergo stent placement and lithotripsy.  The patient began to experience right lower extremity swelling and pain which progressively worsened prompting her to seek medical attention.  The patient was found to have an acute right popliteal DVT.  The patient was placed on Xarelto.  The patient started to experience the same symptoms in her left leg a few days later which then prompted her to seek medical attention again however DVT was ruled out.  The patient denies any shortness of breath or chest pain.  The patient is experiencing bilateral lower extremity edema.  At this time the patient is not wearing compression socks are engaging and elevation.  The patient was ruled out for a PE.  The patient denies any fever, nausea or vomiting.  Review of Systems  Constitutional: Negative.   HENT: Negative.   Eyes: Negative.   Respiratory: Negative.   Cardiovascular: Positive for leg swelling.       Right popliteal DVT  Gastrointestinal: Negative.   Endocrine: Negative.   Genitourinary: Negative.   Musculoskeletal: Negative.   Skin: Negative.   Allergic/Immunologic: Negative.   Neurological: Negative.   Hematological: Negative.   Psychiatric/Behavioral: Negative.       Objective:   Physical Exam    Constitutional: She is oriented to person, place, and time. She appears well-developed and well-nourished. No distress.  HENT:  Head: Normocephalic and atraumatic.  Right Ear: External ear normal.  Left Ear: External ear normal.  Eyes: Pupils are equal, round, and reactive to light. Conjunctivae and EOM are normal.  Neck: Normal range of motion.  Cardiovascular: Normal rate, regular rhythm, normal heart sounds and intact distal pulses.  Pulses:      Radial pulses are 2+ on the right side, and 2+ on the left side.  Hard to palpate pedal pulses due to body habitus and edema. Nontender to palpation bilaterally No pain with dorsiflexion bilaterally  Pulmonary/Chest: Effort normal and breath sounds normal.  Musculoskeletal: Normal range of motion. She exhibits edema (Moderate nonpitting edema noted bilaterally).  Neurological: She is alert and oriented to person, place, and time.  Skin: Skin is warm and dry. She is not diaphoretic.  Psychiatric: She has a normal mood and affect. Her behavior is normal. Judgment and thought content normal.  Vitals reviewed.  BP 139/83   Pulse 88   Resp 16   Ht 5\' 6"  (1.676 m)   Wt (!) 341 lb (154.7 kg)   BMI 55.04 kg/m   Past Medical History:  Diagnosis Date  . Anemia   . Anxiety   . Asthma    seasonal  . Brain tumor ([place]) 2014   tx with radiation  . Cancer ([place])   . Complication of anesthesia 1999  lung collapse after surgery for gallbladder  . Depression   . Headache    migraines  . Hoarseness of voice   . Hypertension   . Kidney stone   . Obesity   . Parathyroid abnormality ([place])   . Pulmonary emboli ([place]) 2011  . Radiation    Brain tumor  . UTI (urinary tract infection)   . Vitamin D deficiency    Social History   Socioeconomic History  . Marital status: Single    Spouse name: Not on file  . Number of children: Not on file  . Years of education: Not on file  . Highest education level: Not on file  Occupational History  .  Not on file  Social Needs  . Financial resource strain: Not on file  . Food insecurity:    Worry: Not on file    Inability: Not on file  . Transportation needs:    Medical: Not on file    Non-medical: Not on file  Tobacco Use  . Smoking status: Never Smoker  . Smokeless tobacco: Never Used  Substance and Sexual Activity  . Alcohol use: Yes    Comment: holidays  . Drug use: No  . Sexual activity: Not on file  Lifestyle  . Physical activity:    Days per week: Not on file    Minutes per session: Not on file  . Stress: Not on file  Relationships  . Social connections:    Talks on phone: Not on file    Gets together: Not on file    Attends religious service: Not on file    Active member of club or organization: Not on file    Attends meetings of clubs or organizations: Not on file    Relationship status: Not on file  . Intimate partner violence:    Fear of current or ex partner: Not on file    Emotionally abused: Not on file    Physically abused: Not on file    Forced sexual activity: Not on file  Other Topics Concern  . Not on file  Social History Narrative   Lives at home with her son, independent at baselinepend   Past Surgical History:  Procedure Laterality Date  . CHOLECYSTECTOMY  1999  . CYSTOSCOPY W/ RETROGRADES Left 03/01/2018   Procedure: CYSTOSCOPY WITH RETROGRADE PYELOGRAM;  Surgeon: Abbie Sons, MD;  Location: [facility];  Service: Urology;  Laterality: Left;  . CYSTOSCOPY W/ URETERAL STENT PLACEMENT Right 04/08/2017   Procedure: CYSTOSCOPY WITH RETROGRADE PYELOGRAM/URETERAL STENT PLACEMENT;  Surgeon: Rana Snare, MD;  Location: [facility];  Service: Urology;  Laterality: Right;  . CYSTOSCOPY W/ URETERAL STENT PLACEMENT Left 03/12/2018   Procedure: CYSTOSCOPY WITH STENT REPLACEMENT;  Surgeon: Abbie Sons, MD;  Location: [facility];  Service: Urology;  Laterality: Left;  . CYSTOSCOPY W/ URETEROSCOPY    . CYSTOSCOPY WITH RETROGRADE PYELOGRAM, URETEROSCOPY AND  STENT PLACEMENT Left 01/20/2014   Procedure: LEFT URETEROSCOPY WITH STENT PLACEMENT;  Surgeon: Irine Seal, MD;  Location: [facility];  Service: Urology;  Laterality: Left;  . CYSTOSCOPY WITH RETROGRADE PYELOGRAM, URETEROSCOPY AND STENT PLACEMENT Bilateral 04/23/2015   Procedure: CYSTOSCOPY WITH BILATERAL RETROGRADE PYELOGRAM, URETEROSCOPY AND STENT PLACEMENT;  Surgeon: Alexis Frock, MD;  Location: [facility];  Service: Urology;  Laterality: Bilateral;  . CYSTOSCOPY WITH RETROGRADE PYELOGRAM, URETEROSCOPY AND STENT PLACEMENT Right 04/13/2017   Procedure: CYSTOSCOPY WITH RETROGRADE PYELOGRAM, URETEROSCOPY AND STENT EXCHANGE;  Surgeon: Alexis Frock, MD;  Location: [facility];  Service: Urology;  Laterality:  Right;  Marland Kitchen CYSTOSCOPY WITH STENT PLACEMENT Left 01/12/2014   Procedure: CYSTOSCOPY WITH STENT PLACEMENT left retrograde;  Surgeon: Irine Seal, MD;  Location: [facility];  Service: Urology;  Laterality: Left;  . CYSTOSCOPY WITH STENT PLACEMENT Left 03/01/2018   Procedure: CYSTOSCOPY WITH STENT PLACEMENT;  Surgeon: Abbie Sons, MD;  Location: [facility];  Service: Urology;  Laterality: Left;  . CYSTOSCOPY/URETEROSCOPY/HOLMIUM LASER/STENT PLACEMENT Left 03/12/2018   Procedure: CYSTOSCOPY/URETEROSCOPY/HOLMIUM LASER;  Surgeon: Abbie Sons, MD;  Location: [facility];  Service: Urology;  Laterality: Left;  . HOLMIUM LASER APPLICATION Left 06/20/1286   Procedure: HOLMIUM LASER APPLICATION;  Surgeon: Irine Seal, MD;  Location: [facility];  Service: Urology;  Laterality: Left;  . HOLMIUM LASER APPLICATION Bilateral 07/24/7671   Procedure: HOLMIUM LASER APPLICATION;  Surgeon: Alexis Frock, MD;  Location: [facility];  Service: Urology;  Laterality: Bilateral;  . HOLMIUM LASER APPLICATION Right 0/94/7096   Procedure: HOLMIUM LASER APPLICATION;  Surgeon: Alexis Frock, MD;  Location: [facility];  Service: Urology;  Laterality: Right;  . kidney stone removal    . LITHOTRIPSY    . PARATHYROIDECTOMY Right 11/13/2016   Procedure: RIGHT  SUPERIOR PARATHYROIDECTOMY;  Surgeon: Armandina Gemma, MD;  Location: [place];  Service: General;  Laterality: Right;  . surgery for,ectopic pregnancy  2011    1 fallopian tube rupture  . TUBAL LIGATION  2011   Family History  Problem Relation Age of Onset  . Bell's palsy Mother   . Heart failure Mother   . Asthma Father   . Hypertension Father    Allergies  Allergen Reactions  . Bee Venom Anaphylaxis  . Contrast Media [Iodinated Diagnostic Agents] Anaphylaxis  . Eggs Or Egg-Derived Products Anaphylaxis  . Penicillins Hives and Other (See Comments)    Has patient had a PCN reaction causing immediate rash, facial/tongue/throat swelling, SOB or lightheadedness with hypotension: No Has patient had a PCN reaction causing severe rash involving mucus membranes or skin necrosis: No Has patient had a PCN reaction that required hospitalization No Has patient had a PCN reaction occurring within the last 10 years: No If all of the above answers are "NO", then may proceed with Cephalosporin use.  . Latex Rash  . Oxycodone-Acetaminophen Itching      Assessment & Plan:  Presents as a new patient referred by the [facility] emergency department for continued follow-up regarding a right lower extremity DVT.  This is the patient's second DVT.  The patient was diagnosed with her first DVT in 2011 after a lithotripsy for a kidney stone.  The patient is prone to kidney stones.  At that time, the patient was also diagnosed with a PE and was on oral anticoagulation for 2 years.  About 2 weeks ago the patient was diagnosed with a 12 mm kidney stone and had to undergo stent placement and lithotripsy.  The patient began to experience right lower extremity swelling and pain which progressively worsened prompting her to seek medical attention.  The patient was found to have an acute right popliteal DVT.  The patient was placed on Xarelto.  The patient started to experience the same symptoms in her  left leg a few days later which then prompted her to seek medical attention again however DVT was ruled out.  The patient denies any shortness of breath or chest pain.  The patient is experiencing bilateral lower extremity edema.  At this time the patient is not wearing compression socks are engaging and elevation.  The patient  was ruled out for a PE.  The patient denies any fever, nausea or vomiting.  1. Acute deep vein thrombosis (DVT) of popliteal vein of right lower extremity ([place]) - New Patient diagnosed with a right popliteal DVT after the episode to proceed with stent placement on March 31, 2018 The patient was not on any oral anticoagulation before being diagnosed with this acute DVT The patient is now on Xarelto We will bring the patient back in 1 to 2 weeks to repeat a right lower extremity venous duplex to assess for any propagation of the DVT The patient is to seek medical attention immediately if she should start to experience any shortness of breath or chest pain At this time I would not wear compression socks in the acute DVT phase.  I would wait another 2 weeks and then start wearing medical grade 1 compression socks. The patient should elevate her legs as much as possible for symptomatic relief  - VAS [place] LOWER EXTREMITY VENOUS (DVT); Future  2. Bilateral lower extremity edema - New As above  - VAS [place] LOWER EXTREMITY VENOUS (DVT); Future  Current Outpatient Medications on File Prior to Visit  Medication Sig Dispense Refill  . acetaminophen (TYLENOL) 500 MG tablet Take 500 mg by mouth every 6 (six) hours as needed.    Marland Kitchen albuterol (PROVENTIL HFA;VENTOLIN HFA) 108 (90 Base) MCG/ACT inhaler Inhale 2 puffs into the lungs every 6 (six) hours as needed for wheezing or shortness of breath. (Patient not taking: Reported on 04/15/2018) 1 Inhaler 2  . albuterol (PROVENTIL) (2.5 MG/3ML) 0.083% nebulizer solution Take 3 mLs (2.5 mg total) by nebulization every 6 (six) hours as needed for  wheezing or shortness of breath. (Patient not taking: Reported on 04/15/2018) 75 mL 12  . diphenhydrAMINE (BENADRYL) 25 MG tablet Take 25 mg by mouth daily as needed for allergies.    Marland Kitchen ibuprofen (ADVIL,MOTRIN) 200 MG tablet Take 800 mg by mouth every 6 (six) hours as needed for moderate pain.    Marland Kitchen ondansetron (ZOFRAN) 4 MG tablet Take 1 tablet (4 mg total) by mouth daily as needed for nausea or vomiting. (Patient not taking: Reported on 03/31/2018) 10 tablet 0  . oxybutynin (DITROPAN) 5 MG tablet Take 1 tablet (5 mg total) by mouth 3 (three) times daily as needed for bladder spasms. (Patient not taking: Reported on 03/31/2018) 12 tablet 0  . tamsulosin (FLOMAX) 0.4 MG CAPS capsule Take 1 capsule (0.4 mg total) by mouth daily. (Patient not taking: Reported on 03/31/2018) 7 capsule 0   Current Facility-Administered Medications on File Prior to Visit  Medication Dose Route Frequency Provider Last Rate Last Dose  . lidocaine (XYLOCAINE) 2 % jelly 1 application  1 application Urethral Once Stoioff, Ronda Fairly, MD       There are no Patient Instructions on file for this visit. No follow-ups on file.  Bert Givans A Robel Wuertz, PA-C

## 2018-04-22 ENCOUNTER — Telehealth (INDEPENDENT_AMBULATORY_CARE_PROVIDER_SITE_OTHER): Payer: Self-pay | Admitting: Vascular Surgery

## 2018-04-22 NOTE — Telephone Encounter (Signed)
The patient has a few options. 1) The patient can keep her originally scheduled appointment for the end of May which I do not recommend because if she has a DVT this can be dangerous. 2) She can be placed on a waiting list and if appointment slot becomes available she is more than welcome to fill it.  I do not recommend this either because I do not know if a sooner appointment slot will become available and with her transportation issues if she would be able to make the appointment. 3) I can try to place the order through the hospital and she travel to the hospital to have the test done.  Again, I do not know how long she would have to wait for an appointment.  Nor if she can make the appointment with her transportation issues. 4) The patient can seek medical attention at an urgent care or the emergency department to have her legs looked at to see if she has a DVT.  Please let me know which she chooses.

## 2018-04-23 ENCOUNTER — Encounter (INDEPENDENT_AMBULATORY_CARE_PROVIDER_SITE_OTHER): Payer: Medicaid Other

## 2018-04-23 ENCOUNTER — Ambulatory Visit (INDEPENDENT_AMBULATORY_CARE_PROVIDER_SITE_OTHER): Payer: Medicaid Other | Admitting: Vascular Surgery

## 2018-05-09 ENCOUNTER — Ambulatory Visit: Payer: Medicaid Other | Admitting: Family Medicine

## 2018-05-09 ENCOUNTER — Encounter: Payer: Self-pay | Admitting: Family Medicine

## 2018-05-09 VITALS — BP 107/73 | HR 85 | Temp 97.7°F | Ht 66.13 in | Wt 338.4 lb

## 2018-05-09 DIAGNOSIS — E213 Hyperparathyroidism, unspecified: Secondary | ICD-10-CM | POA: Diagnosis not present

## 2018-05-09 DIAGNOSIS — F339 Major depressive disorder, recurrent, unspecified: Secondary | ICD-10-CM | POA: Diagnosis not present

## 2018-05-09 DIAGNOSIS — E559 Vitamin D deficiency, unspecified: Secondary | ICD-10-CM

## 2018-05-09 DIAGNOSIS — L309 Dermatitis, unspecified: Secondary | ICD-10-CM

## 2018-05-09 DIAGNOSIS — N200 Calculus of kidney: Secondary | ICD-10-CM | POA: Diagnosis not present

## 2018-05-09 DIAGNOSIS — I82431 Acute embolism and thrombosis of right popliteal vein: Secondary | ICD-10-CM | POA: Diagnosis not present

## 2018-05-09 DIAGNOSIS — F411 Generalized anxiety disorder: Secondary | ICD-10-CM | POA: Diagnosis not present

## 2018-05-09 DIAGNOSIS — Z13220 Encounter for screening for lipoid disorders: Secondary | ICD-10-CM | POA: Diagnosis not present

## 2018-05-09 LAB — UA/M W/RFLX CULTURE, ROUTINE
Bilirubin, UA: NEGATIVE
GLUCOSE, UA: NEGATIVE
Leukocytes, UA: NEGATIVE
Nitrite, UA: NEGATIVE
PH UA: 7 (ref 5.0–7.5)
RBC, UA: NEGATIVE
Specific Gravity, UA: 1.02 (ref 1.005–1.030)
Urobilinogen, Ur: 4 mg/dL — ABNORMAL HIGH (ref 0.2–1.0)

## 2018-05-09 LAB — COAGUCHEK XS/INR WAIVED
INR: 1.1 (ref 0.9–1.1)
Prothrombin Time: 13.3 s

## 2018-05-09 LAB — BAYER DCA HB A1C WAIVED: HB A1C: 5.8 % (ref <7.0)

## 2018-05-09 MED ORDER — BUPROPION HCL ER (SR) 150 MG PO TB12: ORAL_TABLET | ORAL | 3 refills | Status: DC

## 2018-05-09 NOTE — Assessment & Plan Note (Signed)
Checking vitamin D today. Await results. Repleat as needed.

## 2018-05-09 NOTE — Assessment & Plan Note (Signed)
Will start her on wellbutrin to help with mood and weight. Recheck 1 month.

## 2018-05-09 NOTE — Assessment & Plan Note (Signed)
Checking uric acid today. Await results. Call with any concerns.

## 2018-05-09 NOTE — Assessment & Plan Note (Signed)
Not under good control. Will start wellbutrin and recheck 1 month. Call with any concerns.

## 2018-05-09 NOTE — Progress Notes (Signed)
BP 107/73 (BP Location: Right Wrist, Patient Position: Sitting, Cuff Size: Large)   Pulse 85   Temp 97.7 F (36.5 C) (Oral)   Ht 5' 6.13" (1.68 m)   Wt (!) 338 lb 6.4 oz (153.5 kg)   SpO2 99%   BMI 54.41 kg/m    Subjective:    Patient ID: Carol Ortega, female    DOB: 27-Mar-1971, 47 y.o.   MRN: [id number]  HPI: Carol Ortega is a 47 y.o. female  Chief Complaint  Patient presents with  . Establish Care  . Depression    Patient states she has depression and anxiety and hasn't been on medication for a while.  Marland Kitchen Anxiety  . Weight Loss    Asked about phentermine. Explained to pateint we do not prescribe that Rx, but can try other therapies if doctor says neccessary.  . Coagulation Disorder    Patient states she hasn't got her PT/INR checked in 1 month.    Has been having kidney stones. Got a DVT and has been on xarelto. She notes that her whole body is aching.   DEPRESSION- had been on medicine in the past. Stopped it a bit ago because she was on a lot of other medicines. She was on lexapro in the past and it didn't seem to be working.  Mood status: uncontrolled Satisfied with current treatment?: no Symptom severity: severe  Duration of current treatment : Not on anything Side effects: no Medication compliance: poor compliance Psychotherapy/counseling: no  Previous psychiatric medications: lexapro, cymbalta, ativan, buspar Depressed mood: yes Anxious mood: yes Anhedonia: no Significant weight loss or gain: yes Insomnia: no hard to fall asleep Fatigue: yes Feelings of worthlessness or guilt: yes Impaired concentration/indecisiveness: yes Suicidal ideations: no Hopelessness: no Crying spells: yes Depression screen PHQ 2/9 05/09/2018  Decreased Interest 3  Down, Depressed, Hopeless 3  PHQ - 2 Score 6  Altered sleeping 3  Tired, decreased energy 3  Change in appetite 2  Feeling bad or failure about yourself  3  Trouble concentrating 3  Moving slowly or  fidgety/restless 3  Suicidal thoughts 2  PHQ-9 Score 25  Difficult doing work/chores Very difficult   GAD 7 : Generalized Anxiety Score 05/09/2018  Nervous, Anxious, on Edge 3  Control/stop worrying 3  Worry too much - different things 3  Trouble relaxing 3  Restless 3  Easily annoyed or irritable 3  Afraid - awful might happen 3  Total GAD 7 Score 21  Anxiety Difficulty Extremely difficult    WEIGHT GAIN Duration: chronic Previous attempts at weight loss: yes Complications of obesity: No Peak weight: 341 Weight loss goal: to be healthy  Weight loss to date: 3 lbs Requesting obesity pharmacotherapy: yes Current weight loss supplements/medications: no Previous weight loss supplements/meds: yes   Active Ambulatory Problems    Diagnosis Date Noted  . 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 Hypercalcemia 02/09/2014  . Vitamin D deficiency 06/25/2014  . Hyperparathyroidism ([place]) 09/10/2015  . Kidney stone 04/09/2017  . Ureteral stone with hydronephrosis 02/28/2018  . Bladder spasms 03/12/2018  . DVT (deep venous thrombosis) ([place]) 04/15/2018  . Morbid obesity due to excess calories ([place]) 05/09/2018  . Depression, recurrent ([place]) 05/09/2018  . GAD (generalized anxiety disorder) 05/09/2018   Resolved Ambulatory Problems    Diagnosis Date Noted  . No Resolved Ambulatory Problems   Past Medical History:  Diagnosis Date  . Anemia   . Anxiety   . Asthma   . Benign brain tumor ([place])   .  Brain tumor ([place]) 2014  . Cancer ([place])   . Complication of anesthesia 1999  . Depression   . Ectopic pregnancy   . Headache   . Hoarseness of voice   . Hypertension   . Kidney stone   . Obesity   . Parathyroid abnormality ([place])   . Pulmonary emboli ([place]) 2011  . Pulmonary embolism ([place])   . Radiation   . UTI (urinary tract infection)   . Vitamin D deficiency    Past Surgical History:  Procedure Laterality Date  . CHOLECYSTECTOMY  1999  . CYSTOSCOPY W/ RETROGRADES Left  03/01/2018   Procedure: CYSTOSCOPY WITH RETROGRADE PYELOGRAM;  Surgeon: Abbie Sons, MD;  Location: [facility];  Service: Urology;  Laterality: Left;  . CYSTOSCOPY W/ URETERAL STENT PLACEMENT Right 04/08/2017   Procedure: CYSTOSCOPY WITH RETROGRADE PYELOGRAM/URETERAL STENT PLACEMENT;  Surgeon: Rana Snare, MD;  Location: [facility];  Service: Urology;  Laterality: Right;  . CYSTOSCOPY W/ URETERAL STENT PLACEMENT Left 03/12/2018   Procedure: CYSTOSCOPY WITH STENT REPLACEMENT;  Surgeon: Abbie Sons, MD;  Location: [facility];  Service: Urology;  Laterality: Left;  . CYSTOSCOPY W/ URETEROSCOPY    . CYSTOSCOPY WITH RETROGRADE PYELOGRAM, URETEROSCOPY AND STENT PLACEMENT Left 01/20/2014   Procedure: LEFT URETEROSCOPY WITH STENT PLACEMENT;  Surgeon: Irine Seal, MD;  Location: [facility];  Service: Urology;  Laterality: Left;  . CYSTOSCOPY WITH RETROGRADE PYELOGRAM, URETEROSCOPY AND STENT PLACEMENT Bilateral 04/23/2015   Procedure: CYSTOSCOPY WITH BILATERAL RETROGRADE PYELOGRAM, URETEROSCOPY AND STENT PLACEMENT;  Surgeon: Alexis Frock, MD;  Location: [facility];  Service: Urology;  Laterality: Bilateral;  . CYSTOSCOPY WITH RETROGRADE PYELOGRAM, URETEROSCOPY AND STENT PLACEMENT Right 04/13/2017   Procedure: CYSTOSCOPY WITH RETROGRADE PYELOGRAM, URETEROSCOPY AND STENT EXCHANGE;  Surgeon: Alexis Frock, MD;  Location: [facility];  Service: Urology;  Laterality: Right;  . CYSTOSCOPY WITH STENT PLACEMENT Left 01/12/2014   Procedure: CYSTOSCOPY WITH STENT PLACEMENT left retrograde;  Surgeon: Irine Seal, MD;  Location[facility];  Service: Urology;  Laterality: Left;  . CYSTOSCOPY WITH STENT PLACEMENT Left 03/01/2018   Procedure: CYSTOSCOPY WITH STENT PLACEMENT;  Surgeon: Abbie Sons, MD;  Location: [facility];  Service: Urology;  Laterality: Left;  . CYSTOSCOPY/URETEROSCOPY/HOLMIUM LASER/STENT PLACEMENT Left 03/12/2018   Procedure: CYSTOSCOPY/URETEROSCOPY/HOLMIUM LASER;  Surgeon: Abbie Sons, MD;  Location: [facility];   Service: Urology;  Laterality: Left;  . HOLMIUM LASER APPLICATION Left 12/27/5091   Procedure: HOLMIUM LASER APPLICATION;  Surgeon: Irine Seal, MD;  Location[facility];  Service: Urology;  Laterality: Left;  . HOLMIUM LASER APPLICATION Bilateral 01/23/7123   Procedure: HOLMIUM LASER APPLICATION;  Surgeon: Alexis Frock, MD;  Location: [facility];  Service: Urology;  Laterality: Bilateral;  . HOLMIUM LASER APPLICATION Right 5/80/9983   Procedure: HOLMIUM LASER APPLICATION;  Surgeon: Alexis Frock, MD;  Location: [facility];  Service: Urology;  Laterality: Right;  . kidney stone removal    . LITHOTRIPSY    . PARATHYROIDECTOMY Right 11/13/2016   Procedure: RIGHT SUPERIOR PARATHYROIDECTOMY;  Surgeon: Armandina Gemma, MD;  Location: [facility];  Service: General;  Laterality: Right;  . surgery for,ectopic pregnancy  2011    1 fallopian tube rupture  . TUBAL LIGATION  2011   Outpatient Encounter Medications as of 05/09/2018  Medication Sig  . acetaminophen (TYLENOL) 500 MG tablet Take 500 mg by mouth every 6 (six) hours as needed.  Marland Kitchen albuterol (PROVENTIL HFA;VENTOLIN HFA) 108 (90 Base) MCG/ACT inhaler Inhale 2 puffs into the lungs every 6 (six) hours as needed for wheezing or  shortness of breath.  Marland Kitchen albuterol (PROVENTIL) (2.5 MG/3ML) 0.083% nebulizer solution Take 3 mLs (2.5 mg total) by nebulization every 6 (six) hours as needed for wheezing or shortness of breath.  . diphenhydrAMINE (BENADRYL) 25 MG tablet Take 25 mg by mouth daily as needed for allergies.  . rivaroxaban (XARELTO) 20 MG TABS tablet Take 1 tablet (20 mg total) by mouth daily with supper.  Marland Kitchen buPROPion (WELLBUTRIN SR) 150 MG 12 hr tablet 1 tab daily for 1 week, then increase to 1 tab BID  . [DISCONTINUED] ibuprofen (ADVIL,MOTRIN) 200 MG tablet Take 800 mg by mouth every 6 (six) hours as needed for moderate pain.  . [DISCONTINUED] ondansetron (ZOFRAN) 4 MG tablet Take 1 tablet (4 mg total) by mouth daily as needed for nausea or vomiting. (Patient not  taking: Reported on 03/31/2018)  . [DISCONTINUED] oxybutynin (DITROPAN) 5 MG tablet Take 1 tablet (5 mg total) by mouth 3 (three) times daily as needed for bladder spasms. (Patient not taking: Reported on 03/31/2018)  . [DISCONTINUED] tamsulosin (FLOMAX) 0.4 MG CAPS capsule Take 1 capsule (0.4 mg total) by mouth daily. (Patient not taking: Reported on 03/31/2018)   Facility-Administered Encounter Medications as of 05/09/2018  Medication  . lidocaine (XYLOCAINE) 2 % jelly 1 application   Allergies  Allergen Reactions  . Bee Venom Anaphylaxis  . Contrast Media [Iodinated Diagnostic Agents] Anaphylaxis  . Eggs Or Egg-Derived Products Anaphylaxis  . Other Anaphylaxis    Surgical dye.. Pt states ok to take if previously taken benadryl or prednisone  . Penicillins Hives and Other (See Comments)    Has patient had a PCN reaction causing immediate rash, facial/tongue/throat swelling, SOB or lightheadedness with hypotension: No Has patient had a PCN reaction causing severe rash involving mucus membranes or skin necrosis: No Has patient had a PCN reaction that required hospitalization No Has patient had a PCN reaction occurring within the last 10 years: No If all of the above answers are "NO", then may proceed with Cephalosporin use.  . Latex Rash  . Oxycodone-Acetaminophen Itching   Social History   Socioeconomic History  . Marital status: Single    Spouse name: Not on file  . Number of children: Not on file  . Years of education: Not on file  . Highest education level: Not on file  Occupational History  . Not on file  Social Needs  . Financial resource strain: Not on file  . Food insecurity:    Worry: Not on file    Inability: Not on file  . Transportation needs:    Medical: Not on file    Non-medical: Not on file  Tobacco Use  . Smoking status: Never Smoker  . Smokeless tobacco: Never Used  Substance and Sexual Activity  . Alcohol use: Not Currently    Comment: holidays  . Drug  use: No  . Sexual activity: Not on file  Lifestyle  . Physical activity:    Days per week: Not on file    Minutes per session: Not on file  . Stress: Not on file  Relationships  . Social connections:    Talks on phone: Not on file    Gets together: Not on file    Attends religious service: Not on file    Active member of club or organization: Not on file    Attends meetings of clubs or organizations: Not on file    Relationship status: Not on file  . Intimate partner violence:    Fear of current  or ex partner: Not on file    Emotionally abused: Not on file    Physically abused: Not on file    Forced sexual activity: Not on file  Other Topics Concern  . Not on file  Social History Narrative   Lives at home with her son, independent at baselinepend   Family History  Problem Relation Age of Onset  . Bell's palsy Mother   . Heart failure Mother   . Hypertension Mother   . Lupus Mother   . Anxiety disorder Mother   . Depression Mother   . Stroke Mother   . Asthma Father   . Hypertension Father   . Hyperlipidemia Father   . Hypertension Maternal Grandmother   . Diabetes Maternal Grandmother   . Sarcoidosis Maternal Grandmother      Review of Systems  Constitutional: Negative.   Respiratory: Negative.   Cardiovascular: Negative.   Musculoskeletal: Negative.   Skin: Negative.   Neurological: Negative.   Psychiatric/Behavioral: Positive for dysphoric mood. Negative for agitation, behavioral problems, confusion, decreased concentration, hallucinations, self-injury, sleep disturbance and suicidal ideas. The patient is nervous/anxious. The patient is not hyperactive.     Per HPI unless specifically indicated above     Objective:    BP 107/73 (BP Location: Right Wrist, Patient Position: Sitting, Cuff Size: Large)   Pulse 85   Temp 97.7 F (36.5 C) (Oral)   Ht 5' 6.13" (1.68 m)   Wt (!) 338 lb 6.4 oz (153.5 kg)   SpO2 99%   BMI 54.41 kg/m   Wt Readings from Last 3  Encounters:  05/09/18 (!) 338 lb 6.4 oz (153.5 kg)  04/15/18 (!) 341 lb (154.7 kg)  04/03/18 (!) 339 lb (153.8 kg)    Physical Exam  Constitutional: She is oriented to person, place, and time. She appears well-developed and well-nourished. No distress.  HENT:  Head: Normocephalic and atraumatic.  Right Ear: Hearing normal.  Left Ear: Hearing normal.  Nose: Nose normal.  Eyes: Conjunctivae and lids are normal. Right eye exhibits no discharge. Left eye exhibits no discharge. No scleral icterus.  Cardiovascular: Normal rate, regular rhythm, normal heart sounds and intact distal pulses. Exam reveals no gallop and no friction rub.  No murmur heard. Pulmonary/Chest: Effort normal and breath sounds normal. No stridor. No respiratory distress. She has no wheezes. She has no rales. She exhibits no tenderness.  Musculoskeletal: Normal range of motion.  Neurological: She is alert and oriented to person, place, and time.  Skin: Skin is warm, dry and intact. Capillary refill takes less than 2 seconds. No rash noted. She is not diaphoretic. No erythema. No pallor.  Psychiatric: She has a normal mood and affect. Her speech is normal and behavior is normal. Judgment and thought content normal. Cognition and memory are normal.  Nursing note and vitals reviewed.   Results for orders placed or performed during the hospital encounter of 32/95/18  Basic metabolic panel  Result Value Ref Range   Sodium 139 135 - 145 mmol/L   Potassium 3.6 3.5 - 5.1 mmol/L   Chloride 106 101 - 111 mmol/L   CO2 28 22 - 32 mmol/L   Glucose, Bld 85 65 - 99 mg/dL   BUN 12 6 - 20 mg/dL   Creatinine, Ser 0.73 0.44 - 1.00 mg/dL   Calcium 9.2 8.9 - 10.3 mg/dL   GFR calc non Af Amer >60 >60 mL/min   GFR calc Af Amer >60 >60 mL/min   Anion gap 5 5 -  15  CBC  Result Value Ref Range   WBC 6.2 3.6 - 11.0 K/uL   RBC 4.15 3.80 - 5.20 MIL/uL   Hemoglobin 11.5 (L) 12.0 - 16.0 g/dL   HCT 35.3 35.0 - 47.0 %   MCV 85.2 80.0 - 100.0  fL   MCH 27.6 26.0 - 34.0 pg   MCHC 32.4 32.0 - 36.0 g/dL   RDW 15.5 (H) 11.5 - 14.5 %   Platelets 329 150 - 440 K/uL  Troponin I  Result Value Ref Range   Troponin I <0.03 <0.03 ng/mL  Pregnancy, urine POC  Result Value Ref Range   Preg Test, Ur NEGATIVE NEGATIVE      Assessment & Plan:   Problem List Items Addressed This Visit      Cardiovascular and Mediastinum   DVT (deep venous thrombosis) ([place])    On xarelto. Following with pulmonology. Call with any concerns. Checking CBC today.       Relevant Orders   CBC with Differential/Platelet   Bayer DCA Hb A1c Waived   Comprehensive metabolic panel   Thyroid Panel With TSH   CoaguChek XS/INR Waived   UA/M w/rflx Culture, Routine     Endocrine   Hyperparathyroidism ([place])    Checking PTH today. Await results. Call with any concerns.       Relevant Orders   CBC with Differential/Platelet   Bayer DCA Hb A1c Waived   Comprehensive metabolic panel   Thyroid Panel With TSH   UA/M w/rflx Culture, Routine   PTH, Intact and Calcium     Genitourinary   Nephrolithiasis, uric acid    Checking uric acid today. Await results. Call with any concerns.       Relevant Orders   CBC with Differential/Platelet   Bayer DCA Hb A1c Waived   Comprehensive metabolic panel   Thyroid Panel With TSH   UA/M w/rflx Culture, Routine   Uric acid     Other   Vitamin D deficiency (Ch[place])    Checking vitamin D today. Await results. Repleat as needed.       Relevant Orders   CBC with Differential/Platelet   Bayer DCA Hb A1c Waived   Comprehensive metabolic panel   VITAMIN D 25 Hydroxy (Vit-D Deficiency, Fractures)   Thyroid Panel With TSH   UA/M w/rflx Culture, Routine   Hypercalcemia    Checking labs today. Await results.       Relevant Orders   CBC with Differential/Platelet   Bayer DCA Hb A1c Waived   Comprehensive metabolic panel   Thyroid Panel With TSH   UA/M w/rflx Culture, Routine   Morbid obesity due to excess  calories ([name])    Will start her on wellbutrin to help with mood and weight. Recheck 1 month.      Relevant Orders   CBC with Differential/Platelet   Bayer DCA Hb A1c Waived   Comprehensive metabolic panel   Thyroid Panel With TSH   UA/M w/rflx Culture, Routine   Depression, recurrent ([place]) - Primary    Not under good control. Will start wellbutrin and recheck 1 month. Call with any concerns.       Relevant Medications   buPROPion (WELLBUTRIN SR) 150 MG 12 hr tablet   GAD (generalized anxiety disorder)    Not under good control. Will start wellbutrin and recheck 1 month. Call with any concerns.       Relevant Medications   buPROPion (WELLBUTRIN SR) 150 MG 12 hr tablet    Other Visit  Diagnoses    Screening for cholesterol level       Labs drawn today. Await results.    Relevant Orders   Lipid Panel w/o Chol/HDL Ratio   Thyroid Panel With TSH   UA/M w/rflx Culture, Routine   Eczema, unspecified type       Would like to see dermatology. Call with any concerns.    Relevant Orders   Ambulatory referral to Dermatology       Follow up plan: Return in about 1 month (around 06/06/2018) for Physical .

## 2018-05-09 NOTE — Assessment & Plan Note (Signed)
Checking labs today. Await results.  

## 2018-05-09 NOTE — Patient Instructions (Signed)
       Bupropion tablets (Depression/Mood Disorders) What is this medicine? BUPROPION (byoo PROE pee on) is used to treat depression. This medicine may be used for other purposes; ask your health care provider or pharmacist if you have questions. COMMON BRAND NAME(S): Wellbutrin What should I tell my health care provider before I take this medicine? They need to know if you have any of these conditions: -an eating disorder, such as anorexia or bulimia -bipolar disorder or psychosis -diabetes or high blood sugar, treated with medication -glaucoma -heart disease, previous heart attack, or irregular heart beat -head injury or brain tumor -high blood pressure -kidney or liver disease -seizures -suicidal thoughts or a previous suicide attempt -Tourette's syndrome -weight loss -an unusual or allergic reaction to bupropion, other medicines, foods, dyes, or preservatives -breast-feeding -pregnant or trying to become pregnant How should I use this medicine? Take this medicine by mouth with a glass of water. Follow the directions on the prescription label. You can take it with or without food. If it upsets your stomach, take it with food. Take your medicine at regular intervals. Do not take your medicine more often than directed. Do not stop taking this medicine suddenly except upon the advice of your doctor. Stopping this medicine too quickly may cause serious side effects or your condition may worsen. A special MedGuide will be given to you by the pharmacist with each prescription and refill. Be sure to read this information carefully each time. Talk to your pediatrician regarding the use of this medicine in children. Special care may be needed. Overdosage: If you think you have taken too much of this medicine contact a poison control center or emergency room at once. NOTE: This medicine is only for you. Do not share this medicine with others. What if I miss a dose? If you miss a dose,  take it as soon as you can. If it is less than four hours to your next dose, take only that dose and skip the missed dose. Do not take double or extra doses. What may interact with this medicine? Do not take this medicine with any of the following medications: -linezolid -MAOIs like Azilect, Carbex, Eldepryl, Marplan, Nardil, and Parnate -methylene blue (injected into a vein) -other medicines that contain bupropion like Zyban This medicine may also interact with the following medications: -alcohol -certain medicines for anxiety or sleep -certain medicines for blood pressure like metoprolol, propranolol -certain medicines for depression or psychotic disturbances -certain medicines for HIV or AIDS like efavirenz, lopinavir, nelfinavir, ritonavir -certain medicines for irregular heart beat like propafenone, flecainide -certain medicines for Parkinson's disease like amantadine, levodopa -certain medicines for seizures like carbamazepine, phenytoin, phenobarbital -cimetidine -clopidogrel -cyclophosphamide -digoxin -furazolidone -isoniazid -nicotine -orphenadrine -procarbazine -steroid medicines like prednisone or cortisone -stimulant medicines for attention disorders, weight loss, or to stay awake -tamoxifen -theophylline -thiotepa -ticlopidine -tramadol -warfarin This list may not describe all possible interactions. Give your health care provider a list of all the medicines, herbs, non-prescription drugs, or dietary supplements you use. Also tell them if you smoke, drink alcohol, or use illegal drugs. Some items may interact with your medicine. What should I watch for while using this medicine? Tell your doctor if your symptoms do not get better or if they get worse. Visit your doctor or health care professional for regular checks on your progress. Because it may take several weeks to see the full effects of this medicine, it is important to continue your treatment as prescribed by    your doctor. Patients and their families should watch out for new or worsening thoughts of suicide or depression. Also watch out for sudden changes in feelings such as feeling anxious, agitated, panicky, irritable, hostile, aggressive, impulsive, severely restless, overly excited and hyperactive, or not being able to sleep. If this happens, especially at the beginning of treatment or after a change in dose, call your health care professional. Avoid alcoholic drinks while taking this medicine. Drinking excessive alcoholic beverages, using sleeping or anxiety medicines, or quickly stopping the use of these agents while taking this medicine may increase your risk for a seizure. Do not drive or use heavy machinery until you know how this medicine affects you. This medicine can impair your ability to perform these tasks. Do not take this medicine close to bedtime. It may prevent you from sleeping. Your mouth may get dry. Chewing sugarless gum or sucking hard candy, and drinking plenty of water may help. Contact your doctor if the problem does not go away or is severe. What side effects may I notice from receiving this medicine? Side effects that you should report to your doctor or health care professional as soon as possible: -allergic reactions like skin rash, itching or hives, swelling of the face, lips, or tongue -breathing problems -changes in vision -confusion -elevated mood, decreased need for sleep, racing thoughts, impulsive behavior -fast or irregular heartbeat -hallucinations, loss of contact with reality -increased blood pressure -redness, blistering, peeling or loosening of the skin, including inside the mouth -seizures -suicidal thoughts or other mood changes -unusually weak or tired -vomiting Side effects that usually do not require medical attention (report to your doctor or health care professional if they continue or are bothersome): -constipation -headache -loss of  appetite -nausea -tremors -weight loss This list may not describe all possible side effects. Call your doctor for medical advice about side effects. You may report side effects to FDA at 1-800-FDA-1088. Where should I keep my medicine? Keep out of the reach of children. Store at room temperature between 20 and 25 degrees C (68 and 77 degrees F), away from direct sunlight and moisture. Keep tightly closed. Throw away any unused medicine after the expiration date. NOTE: This sheet is a summary. It may not cover all possible information. If you have questions about this medicine, talk to your doctor, pharmacist, or health care provider.  2018 Elsevier/Gold Standard (2016-05-26 13:44:21)  

## 2018-05-09 NOTE — Assessment & Plan Note (Signed)
On xarelto. Following with pulmonology. Call with any concerns. Checking CBC today.

## 2018-05-09 NOTE — Assessment & Plan Note (Signed)
Checking PTH today. Await results. Call with any concerns.

## 2018-05-10 ENCOUNTER — Other Ambulatory Visit: Payer: Self-pay | Admitting: Family Medicine

## 2018-05-10 LAB — CBC WITH DIFFERENTIAL/PLATELET
BASOS: 1 %
Basophils Absolute: 0 10*3/uL (ref 0.0–0.2)
EOS (ABSOLUTE): 0.2 10*3/uL (ref 0.0–0.4)
EOS: 2 %
Hematocrit: 39.6 % (ref 34.0–46.6)
Hemoglobin: 12.1 g/dL (ref 11.1–15.9)
Immature Grans (Abs): 0 10*3/uL (ref 0.0–0.1)
Immature Granulocytes: 0 %
LYMPHS ABS: 2.3 10*3/uL (ref 0.7–3.1)
Lymphs: 34 %
MCH: 27 pg (ref 26.6–33.0)
MCHC: 30.6 g/dL — AB (ref 31.5–35.7)
MCV: 88 fL (ref 79–97)
MONOS ABS: 0.7 10*3/uL (ref 0.1–0.9)
Monocytes: 10 %
NEUTROS ABS: 3.5 10*3/uL (ref 1.4–7.0)
Neutrophils: 53 %
Platelets: 334 10*3/uL (ref 150–450)
RBC: 4.48 x10E6/uL (ref 3.77–5.28)
RDW: 14.9 % (ref 12.3–15.4)
WBC: 6.8 10*3/uL (ref 3.4–10.8)

## 2018-05-10 LAB — PTH, INTACT AND CALCIUM
Calcium: 9.5 mg/dL (ref 8.7–10.2)
PTH: 50 pg/mL (ref 15–65)

## 2018-05-10 LAB — THYROID PANEL WITH TSH
FREE THYROXINE INDEX: 1.3 (ref 1.2–4.9)
T3 UPTAKE RATIO: 21 % — AB (ref 24–39)
T4 TOTAL: 6.3 ug/dL (ref 4.5–12.0)
TSH: 1.86 u[IU]/mL (ref 0.450–4.500)

## 2018-05-10 LAB — COMPREHENSIVE METABOLIC PANEL
A/G RATIO: 1.2 (ref 1.2–2.2)
ALK PHOS: 88 IU/L (ref 39–117)
ALT: 7 IU/L (ref 0–32)
AST: 8 IU/L (ref 0–40)
Albumin: 3.9 g/dL (ref 3.5–5.5)
BUN/Creatinine Ratio: 11 (ref 9–23)
BUN: 9 mg/dL (ref 6–24)
Bilirubin Total: 0.3 mg/dL (ref 0.0–1.2)
CO2: 25 mmol/L (ref 20–29)
Calcium: 9.6 mg/dL (ref 8.7–10.2)
Chloride: 103 mmol/L (ref 96–106)
Creatinine, Ser: 0.83 mg/dL (ref 0.57–1.00)
GFR calc Af Amer: 98 mL/min/{1.73_m2} (ref >59)
GFR calc non Af Amer: 85 mL/min/{1.73_m2} (ref >59)
Globulin, Total: 3.2 g/dL (ref 1.5–4.5)
Glucose: 89 mg/dL (ref 65–99)
POTASSIUM: 4.2 mmol/L (ref 3.5–5.2)
SODIUM: 141 mmol/L (ref 134–144)
Total Protein: 7.1 g/dL (ref 6.0–8.5)

## 2018-05-10 LAB — LIPID PANEL W/O CHOL/HDL RATIO
Cholesterol, Total: 219 mg/dL — ABNORMAL HIGH (ref 100–199)
HDL: 45 mg/dL (ref >39)
LDL Calculated: 149 mg/dL — ABNORMAL HIGH (ref 0–99)
TRIGLYCERIDES: 126 mg/dL (ref 0–149)
VLDL Cholesterol Cal: 25 mg/dL (ref 5–40)

## 2018-05-10 LAB — VITAMIN D 25 HYDROXY (VIT D DEFICIENCY, FRACTURES): VIT D 25 HYDROXY: 11.6 ng/mL — AB (ref 30.0–100.0)

## 2018-05-10 LAB — URIC ACID: Uric Acid: 4.4 mg/dL (ref 2.5–7.1)

## 2018-05-10 MED ORDER — VITAMIN D (ERGOCALCIFEROL) 1.25 MG (50000 UNIT) PO CAPS: 50000.0000 [IU] | ORAL_CAPSULE | ORAL | 1 refills | Status: DC

## 2018-05-14 IMAGING — CT CT RENAL STONE PROTOCOL
2 of 4 series · 16 of 46 positions shown, 18 images · non-contrast
Comparison: none

CLINICAL DATA: Initial evaluation for acute left flank pain.
History of kidney stones.

EXAM:
CT ABDOMEN AND PELVIS WITHOUT CONTRAST
TECHNIQUE: Multidetector CT imaging of the abdomen and pelvis was performed
following the standard protocol without IV contrast.

[Series 2: stone full standard · axial · 0.81mm/px · z∈[-511,-61]mm · 13 of 98 slices shown, 15 images]
[im 4/98  soft-tissue]
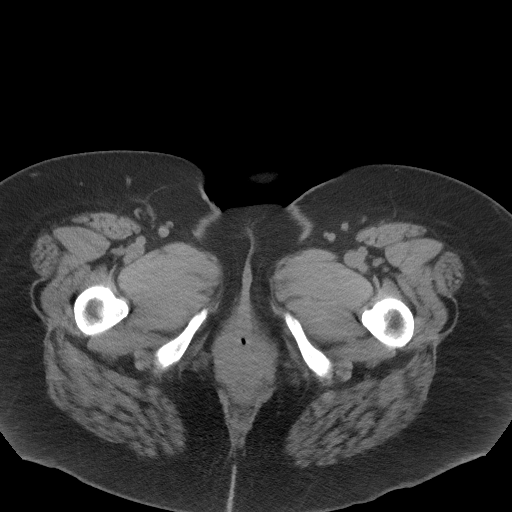
[im 4/98  bone]
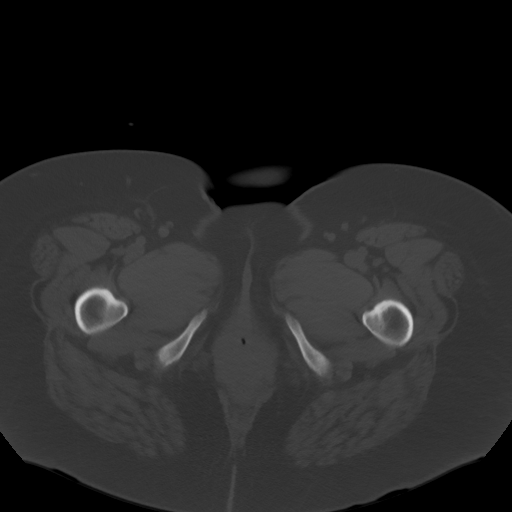
[im 12/98  soft-tissue]
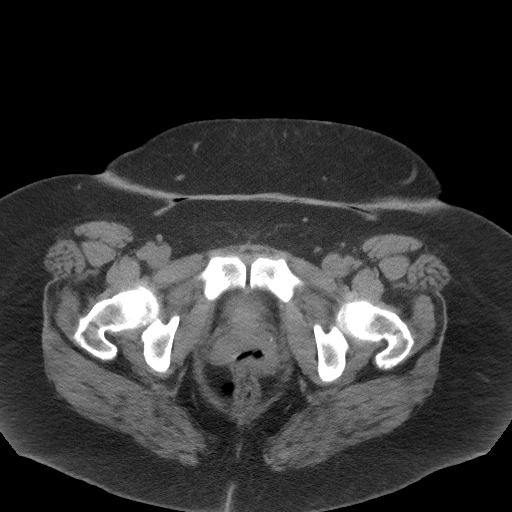
[im 19/98  soft-tissue]
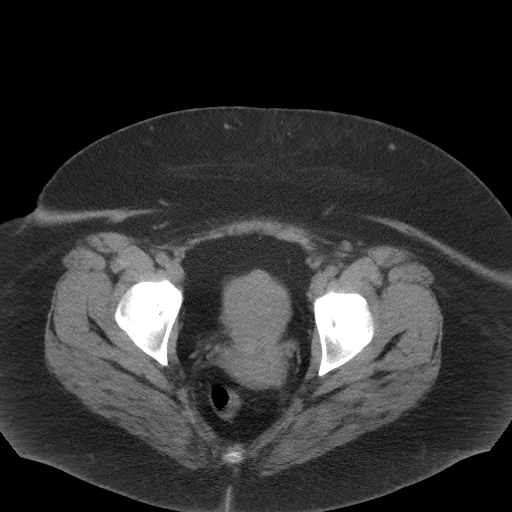
[im 27/98  soft-tissue]
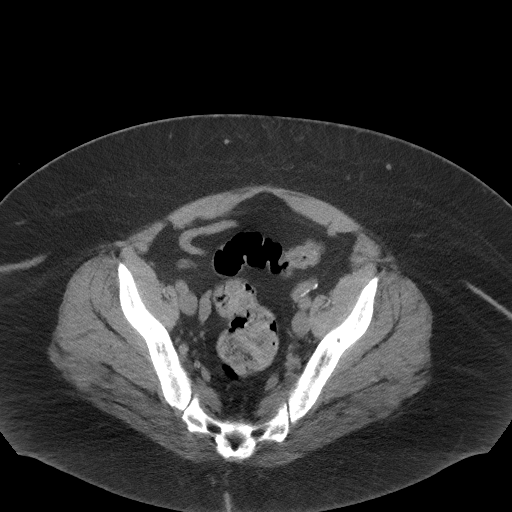
[im 34/98  soft-tissue]
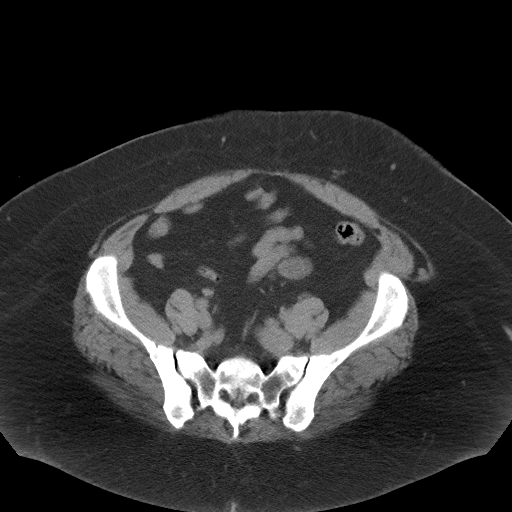
[im 42/98  soft-tissue]
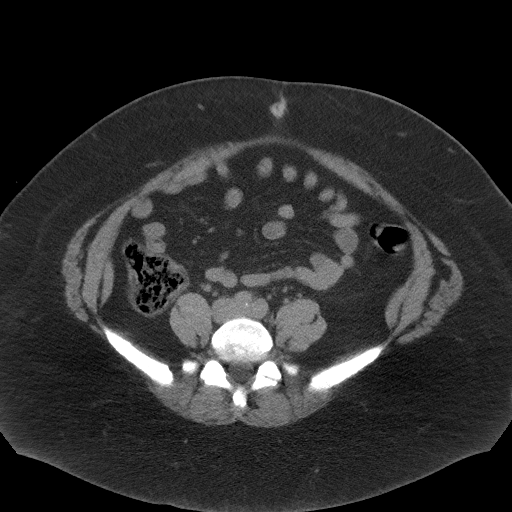
[im 49/98  soft-tissue]
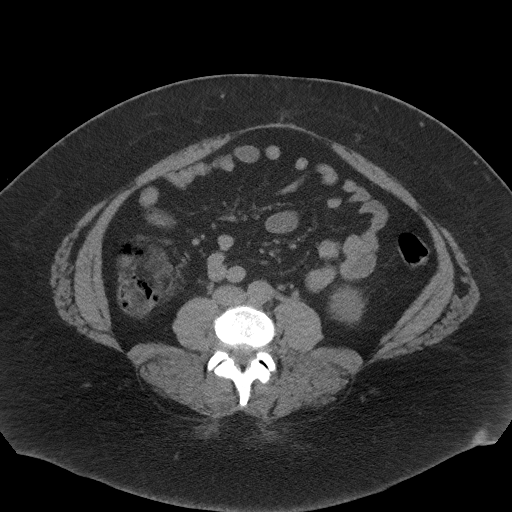
[im 56/98  soft-tissue]
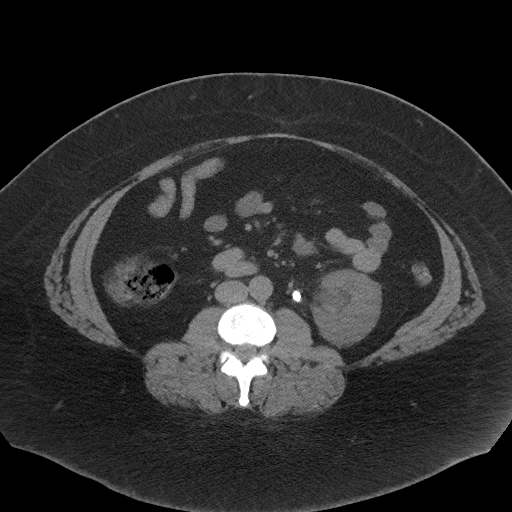
[im 64/98  soft-tissue]
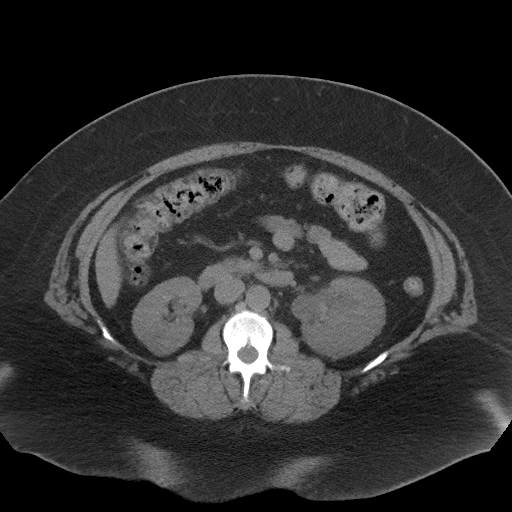
[im 64/98  bone]
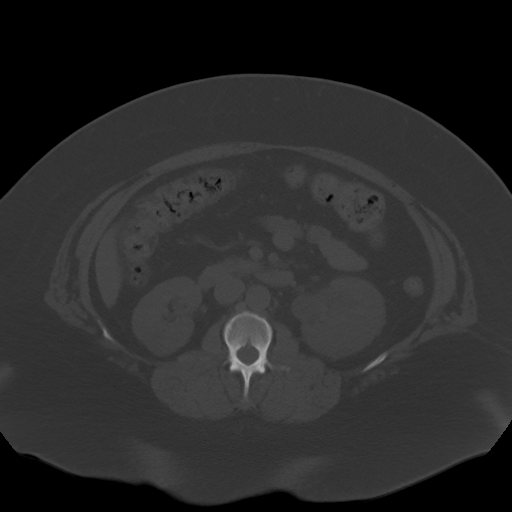
[im 71/98  soft-tissue]
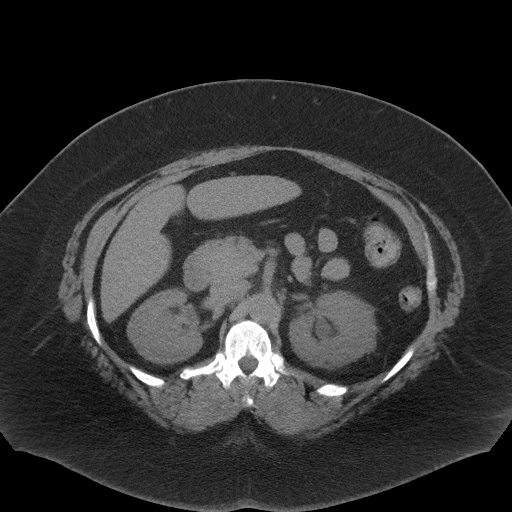
[im 79/98  soft-tissue]
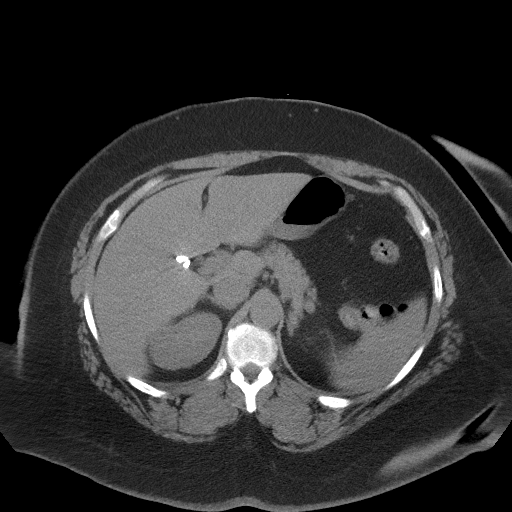
[im 86/98  soft-tissue]
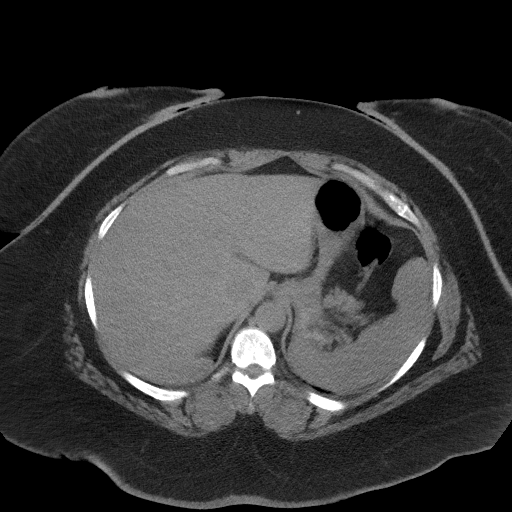
[im 94/98  soft-tissue]
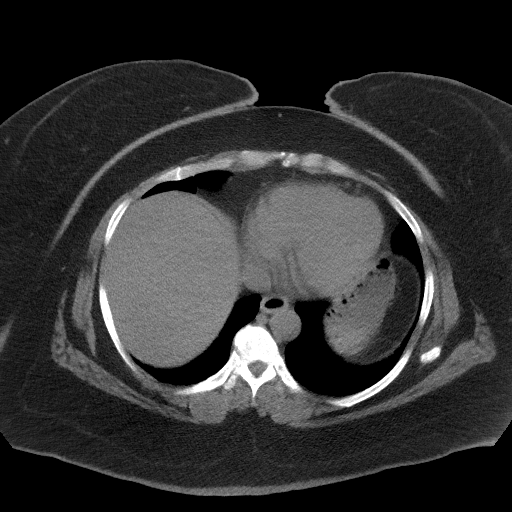

[Series 5: coronal · coronal · 0.88mm/px · 3 of 188 slices shown]
[im 63/188  soft-tissue]
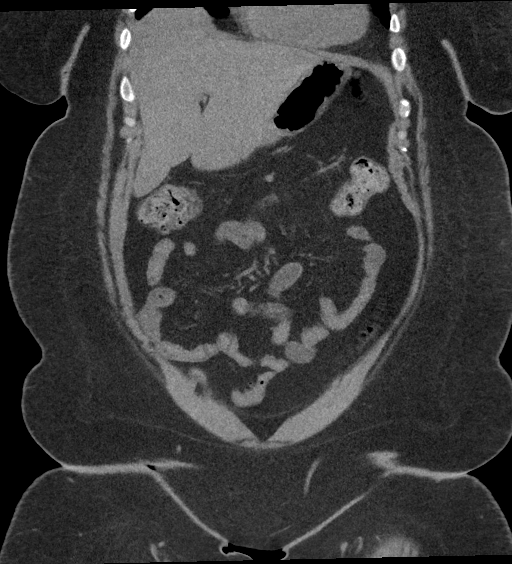
[im 84/188  soft-tissue]
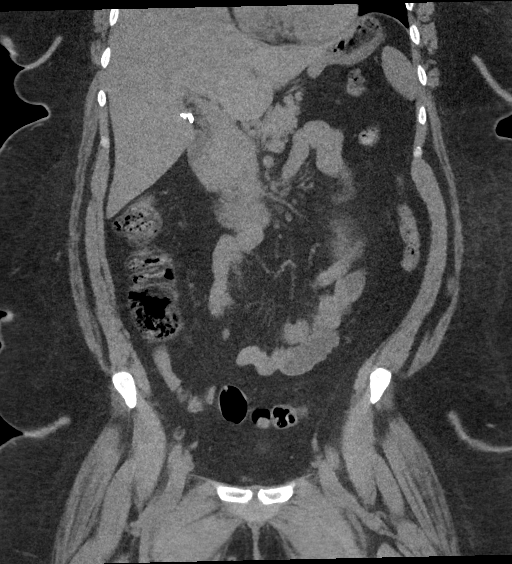
[im 104/188  soft-tissue]
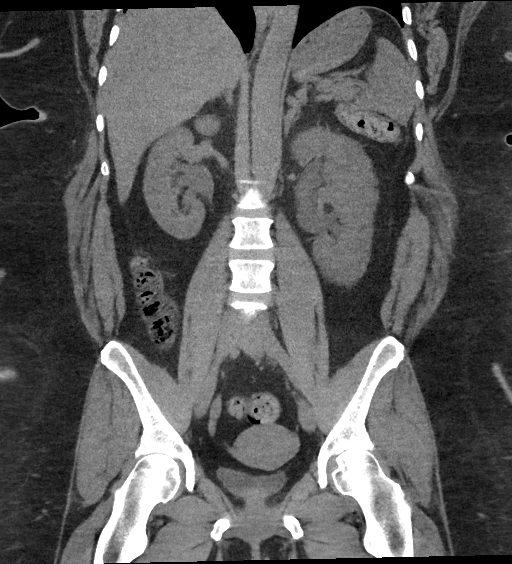

[16 of 46 positions shown; findings below may reference images not displayed]

FINDINGS: Lower chest: Mild scattered atelectatic changes present within the
visualized lung bases. Visualized lungs are otherwise clear.

Hepatobiliary: Liver demonstrates a normal unenhanced appearance.
Gallbladder surgically absent. No biliary dilatation.

Pancreas: Pancreas within normal limits.

Spleen: Spleen within normal limits.

Adrenals/Urinary Tract:

Adrenal glands are normal.

There is an obstructive 9 mm stone at the left UPJ with secondary
moderate left hydronephrosis. Associated left perinephric fat
stranding. Additional nonobstructive 11 mm stone within the inferior
pole left kidney. No other radiopaque calculi seen distally within
the left ureter.

No right-sided nephrolithiasis or ureterolithiasis. Few scattered
approximate 2 cm cyst present at the lower pole the right kidney.

Bladder largely decompressed without acute abnormality. No layering
stones within the bladder lumen.

Stomach/Bowel: Stomach within normal limits. No evidence for bowel
obstruction. Appendix is normal. No acute inflammatory changes seen
about the bowels. Few scattered colonic diverticula noted.

Vascular/Lymphatic: Mild aortic atherosclerosis. No aneurysm. No
adenopathy.

Reproductive: Uterus and ovaries within normal limits.

Other: No free air or fluid.

Musculoskeletal: No acute osseous abnormality. No worrisome lytic or
blastic osseous lesions.
IMPRESSION: 1. 9 mm obstructive stone at the left UPJ with secondary moderate
left hydronephrosis.
2. Additional 11 mm nonobstructive left renal calculus.
3. No other acute intra-abdominal or pelvic process.

## 2018-05-15 ENCOUNTER — Encounter (INDEPENDENT_AMBULATORY_CARE_PROVIDER_SITE_OTHER): Payer: Self-pay | Admitting: Vascular Surgery

## 2018-05-15 ENCOUNTER — Ambulatory Visit (INDEPENDENT_AMBULATORY_CARE_PROVIDER_SITE_OTHER): Payer: Medicaid Other

## 2018-05-15 ENCOUNTER — Ambulatory Visit (INDEPENDENT_AMBULATORY_CARE_PROVIDER_SITE_OTHER): Payer: Medicaid Other | Admitting: Vascular Surgery

## 2018-05-15 ENCOUNTER — Encounter

## 2018-05-15 VITALS — BP 127/80 | HR 92 | Resp 17 | Ht 66.0 in | Wt 343.0 lb

## 2018-05-15 DIAGNOSIS — R600 Localized edema: Secondary | ICD-10-CM

## 2018-05-15 DIAGNOSIS — I82431 Acute embolism and thrombosis of right popliteal vein: Secondary | ICD-10-CM

## 2018-05-15 NOTE — Progress Notes (Signed)
Subjective:    Patient ID: Carol Ortega, female    DOB: [birth year], 47 y.o.   MRN: [id number] Chief Complaint  Patient presents with  . Follow-up    1-2week rle ven dvt   Patient presents to review vascular studies.  Patient was last seen on April 15, 2018 and evaluation of a right lower extremity acute DVT.  At the time patient was placed on Xarelto.  The patient continues to take Xarelto on a daily basis.  The patient presents today without complaint.  The patient denies any right lower extremity pain or swelling.  Patient denies any shortness of breath or chest pain.  Patient denies any fever, nausea vomiting.  Patient underwent a right lower extremity DVT study which was notable for an improvement when compared to the previous examination.  There is no evidence of deep vein thrombosis in the right lower extremity.  Partially occlusive, not acute thrombus in the right small saphenous vein from the mid calf to the distal knee.  No evidence of common femoral vein obstruction.  Review of Systems  Constitutional: Negative.   HENT: Negative.   Eyes: Negative.   Respiratory: Negative.   Cardiovascular:       DVT  Gastrointestinal: Negative.   Endocrine: Negative.   Genitourinary: Negative.   Musculoskeletal: Negative.   Skin: Negative.   Allergic/Immunologic: Negative.   Neurological: Negative.   Hematological: Negative.   Psychiatric/Behavioral: Negative.      Objective:   Physical Exam  Constitutional: She is oriented to person, place, and time. She appears well-developed and well-nourished. No distress.  HENT:  Head: Normocephalic and atraumatic.  Right Ear: External ear normal.  Left Ear: External ear normal.  Eyes: Pupils are equal, round, and reactive to light. Conjunctivae are normal.  Neck: Normal range of motion.  Cardiovascular: Normal rate, regular rhythm, normal heart sounds and intact distal pulses.  Pulses:      Radial pulses are 2+ on the right side, and 2+ on  the left side.       Dorsalis pedis pulses are 2+ on the right side, and 2+ on the left side.       Posterior tibial pulses are 2+ on the right side, and 2+ on the left side.  Right lower extremity: No pain palpation.  No pain with dorsiflexion.  There is no cellulitis or erythema noted to the right lower extremity.  Skin is intact.  There is no acute vascular compromise to the right lower extremity.  Pulmonary/Chest: Effort normal and breath sounds normal.  Musculoskeletal: Normal range of motion. She exhibits no edema.  Neurological: She is alert and oriented to person, place, and time.  Skin: Skin is warm and dry. She is not diaphoretic.  Psychiatric: She has a normal mood and affect. Her behavior is normal. Judgment and thought content normal.   BP 127/80 (BP Location: Right Arm)   Pulse 92   Resp 17   Ht 5\' 6"  (1.676 m)   Wt (!) 343 lb (155.6 kg)   BMI 55.36 kg/m   Past Medical History:  Diagnosis Date  . Anemia   . Anxiety   . Asthma    seasonal  . Benign brain tumor ([place])   . Brain tumor ([place]) 2014   tx with radiation  . Cancer ([place])   . Complication of anesthesia 1999   lung collapse after surgery for gallbladder  . Depression   . Ectopic pregnancy   . Headache    migraines  .  Hoarseness of voice   . Hypertension   . Kidney stone   . Obesity   . Parathyroid abnormality ([place])   . Pulmonary emboli ([place]) 2011  . Pulmonary embolism ([place])   . Radiation    Brain tumor  . UTI (urinary tract infection)   . Vitamin D deficiency    Social History   Socioeconomic History  . Marital status: Single    Spouse name: Not on file  . Number of children: Not on file  . Years of education: Not on file  . Highest education level: Not on file  Occupational History  . Not on file  Social Needs  . Financial resource strain: Not on file  . Food insecurity:    Worry: Not on file    Inability: Not on file  . Transportation needs:    Medical: Not on file    Non-medical: Not  on file  Tobacco Use  . Smoking status: Never Smoker  . Smokeless tobacco: Never Used  Substance and Sexual Activity  . Alcohol use: Not Currently    Comment: holidays  . Drug use: No  . Sexual activity: Not on file  Lifestyle  . Physical activity:    Days per week: Not on file    Minutes per session: Not on file  . Stress: Not on file  Relationships  . Social connections:    Talks on phone: Not on file    Gets together: Not on file    Attends religious service: Not on file    Active member of club or organization: Not on file    Attends meetings of clubs or organizations: Not on file    Relationship status: Not on file  . Intimate partner violence:    Fear of current or ex partner: Not on file    Emotionally abused: Not on file    Physically abused: Not on file    Forced sexual activity: Not on file  Other Topics Concern  . Not on file  Social History Narrative   Lives at home with her son, independent at baselinepend   Past Surgical History:  Procedure Laterality Date  . CHOLECYSTECTOMY  1999  . CYSTOSCOPY W/ RETROGRADES Left 03/01/2018   Procedure: CYSTOSCOPY WITH RETROGRADE PYELOGRAM;  Surgeon: Abbie Sons, MD;  Location: [facility];  Service: Urology;  Laterality: Left;  . CYSTOSCOPY W/ URETERAL STENT PLACEMENT Right 04/08/2017   Procedure: CYSTOSCOPY WITH RETROGRADE PYELOGRAM/URETERAL STENT PLACEMENT;  Surgeon: Rana Snare, MD;  Location: [facility];  Service: Urology;  Laterality: Right;  . CYSTOSCOPY W/ URETERAL STENT PLACEMENT Left 03/12/2018   Procedure: CYSTOSCOPY WITH STENT REPLACEMENT;  Surgeon: Abbie Sons, MD;  Location: [facility];  Service: Urology;  Laterality: Left;  . CYSTOSCOPY W/ URETEROSCOPY    . CYSTOSCOPY WITH RETROGRADE PYELOGRAM, URETEROSCOPY AND STENT PLACEMENT Left 01/20/2014   Procedure: LEFT URETEROSCOPY WITH STENT PLACEMENT;  Surgeon: Irine Seal, MD;  Location: [facility];  Service: Urology;  Laterality: Left;  . CYSTOSCOPY WITH RETROGRADE  PYELOGRAM, URETEROSCOPY AND STENT PLACEMENT Bilateral 04/23/2015   Procedure: CYSTOSCOPY WITH BILATERAL RETROGRADE PYELOGRAM, URETEROSCOPY AND STENT PLACEMENT;  Surgeon: Alexis Frock, MD;  Location: [facility];  Service: Urology;  Laterality: Bilateral;  . CYSTOSCOPY WITH RETROGRADE PYELOGRAM, URETEROSCOPY AND STENT PLACEMENT Right 04/13/2017   Procedure: CYSTOSCOPY WITH RETROGRADE PYELOGRAM, URETEROSCOPY AND STENT EXCHANGE;  Surgeon: Alexis Frock, MD;  Location: [facility];  Service: Urology;  Laterality: Right;  . CYSTOSCOPY WITH STENT PLACEMENT Left 01/12/2014   Procedure:  CYSTOSCOPY WITH STENT PLACEMENT left retrograde;  Surgeon: Irine Seal, MD;  Location: [facility];  Service: Urology;  Laterality: Left;  . CYSTOSCOPY WITH STENT PLACEMENT Left 03/01/2018   Procedure: CYSTOSCOPY WITH STENT PLACEMENT;  Surgeon: Abbie Sons, MD;  Location: [facility];  Service: Urology;  Laterality: Left;  . CYSTOSCOPY/URETEROSCOPY/HOLMIUM LASER/STENT PLACEMENT Left 03/12/2018   Procedure: CYSTOSCOPY/URETEROSCOPY/HOLMIUM LASER;  Surgeon: Abbie Sons, MD;  Location: [facility];  Service: Urology;  Laterality: Left;  . HOLMIUM LASER APPLICATION Left 07/18/174   Procedure: HOLMIUM LASER APPLICATION;  Surgeon: Irine Seal, MD;  Location: [facility];  Service: Urology;  Laterality: Left;  . HOLMIUM LASER APPLICATION Bilateral 1/0/2585   Procedure: HOLMIUM LASER APPLICATION;  Surgeon: Alexis Frock, MD;  Location: [facility];  Service: Urology;  Laterality: Bilateral;  . HOLMIUM LASER APPLICATION Right 2/77/8242   Procedure: HOLMIUM LASER APPLICATION;  Surgeon: Alexis Frock, MD;  Location: [facility];  Service: Urology;  Laterality: Right;  . kidney stone removal    . LITHOTRIPSY    . PARATHYROIDECTOMY Right 11/13/2016   Procedure: RIGHT SUPERIOR PARATHYROIDECTOMY;  Surgeon: Armandina Gemma, MD;  Location: [place];  Service: General;  Laterality: Right;  . surgery for,ectopic pregnancy  2011    1 fallopian tube rupture  . TUBAL LIGATION   2011   Family History  Problem Relation Age of Onset  . Bell's palsy Mother   . Heart failure Mother   . Hypertension Mother   . Lupus Mother   . Anxiety disorder Mother   . Depression Mother   . Stroke Mother   . Asthma Father   . Hypertension Father   . Hyperlipidemia Father   . Hypertension Maternal Grandmother   . Diabetes Maternal Grandmother   . Sarcoidosis Maternal Grandmother    Allergies  Allergen Reactions  . Bee Venom Anaphylaxis  . Contrast Media [Iodinated Diagnostic Agents] Anaphylaxis  . Eggs Or Egg-Derived Products Anaphylaxis  . Other Anaphylaxis    Surgical dye.. Pt states ok to take if previously taken benadryl or prednisone  . Penicillins Hives and Other (See Comments)    Has patient had a PCN reaction causing immediate rash, facial/tongue/throat swelling, SOB or lightheadedness with hypotension: No Has patient had a PCN reaction causing severe rash involving mucus membranes or skin necrosis: No Has patient had a PCN reaction that required hospitalization No Has patient had a PCN reaction occurring within the last 10 years: No If all of the above answers are "NO", then may proceed with Cephalosporin use.  . Latex Rash  . Oxycodone-Acetaminophen Itching      Assessment & Plan:  Patient presents to review vascular studies.  Patient was last seen on April 15, 2018 and evaluation of a right lower extremity acute DVT.  At the time patient was placed on Xarelto.  The patient continues to take Xarelto on a daily basis.  The patient presents today without complaint.  The patient denies any right lower extremity pain or swelling.  Patient denies any shortness of breath or chest pain.  Patient denies any fever, nausea vomiting.  Patient underwent a right lower extremity DVT study which was notable for an improvement when compared to the previous examination.  There is no evidence of deep vein thrombosis in the right lower extremity.  Partially occlusive, not acute  thrombus in the right small saphenous vein from the mid calf to the distal knee.  No evidence of common femoral vein obstruction.  1. Acute deep vein thrombosis (DVT) of popliteal  vein of right lower extremity ([place]) - Resolved The patient's acute DVT has resolved on today's duplex however was still continue the patient's Xarelto for at least 3 months as this will reduce the risk of recurrence Patient is to follow-up in 3 months and undergo a repeat right lower extremity DVT study if at that point the DVT is still resolve we can discuss coming off the blood thinner Patient expresses her understanding  - VAS [place] LOWER EXTREMITY VENOUS (DVT); Future   Current Outpatient Medications on File Prior to Visit  Medication Sig Dispense Refill  . acetaminophen (TYLENOL) 500 MG tablet Take 500 mg by mouth every 6 (six) hours as needed.    Marland Kitchen albuterol (PROVENTIL HFA;VENTOLIN HFA) 108 (90 Base) MCG/ACT inhaler Inhale 2 puffs into the lungs every 6 (six) hours as needed for wheezing or shortness of breath. 1 Inhaler 2  . albuterol (PROVENTIL) (2.5 MG/3ML) 0.083% nebulizer solution Take 3 mLs (2.5 mg total) by nebulization every 6 (six) hours as needed for wheezing or shortness of breath. 75 mL 12  . buPROPion (WELLBUTRIN SR) 150 MG 12 hr tablet 1 tab daily for 1 week, then increase to 1 tab BID 60 tablet 3  . diphenhydrAMINE (BENADRYL) 25 MG tablet Take 25 mg by mouth daily as needed for allergies.    . rivaroxaban (XARELTO) 20 MG TABS tablet Take 1 tablet (20 mg total) by mouth daily with supper. 30 tablet 5  . Vitamin D, Ergocalciferol, (DRISDOL) 50000 units CAPS capsule Take 1 capsule (50,000 Units total) by mouth every 7 (seven) days. 12 capsule 1   Current Facility-Administered Medications on File Prior to Visit  Medication Dose Route Frequency Provider Last Rate Last Dose  . lidocaine (XYLOCAINE) 2 % jelly 1 application  1 application Urethral Once Stoioff, Ronda Fairly, MD        There are no Patient  Instructions on file for this visit. No follow-ups on file.   Marrian Bells A Baltasar Twilley, PA-C

## 2018-06-20 IMAGING — CR DG CHEST 2V
2 series · 2 of 2 positions shown · non-contrast
Comparison: 03/13/2018

CLINICAL DATA: Chest pain

EXAM:
CHEST - 2 VIEW

[chest pa]
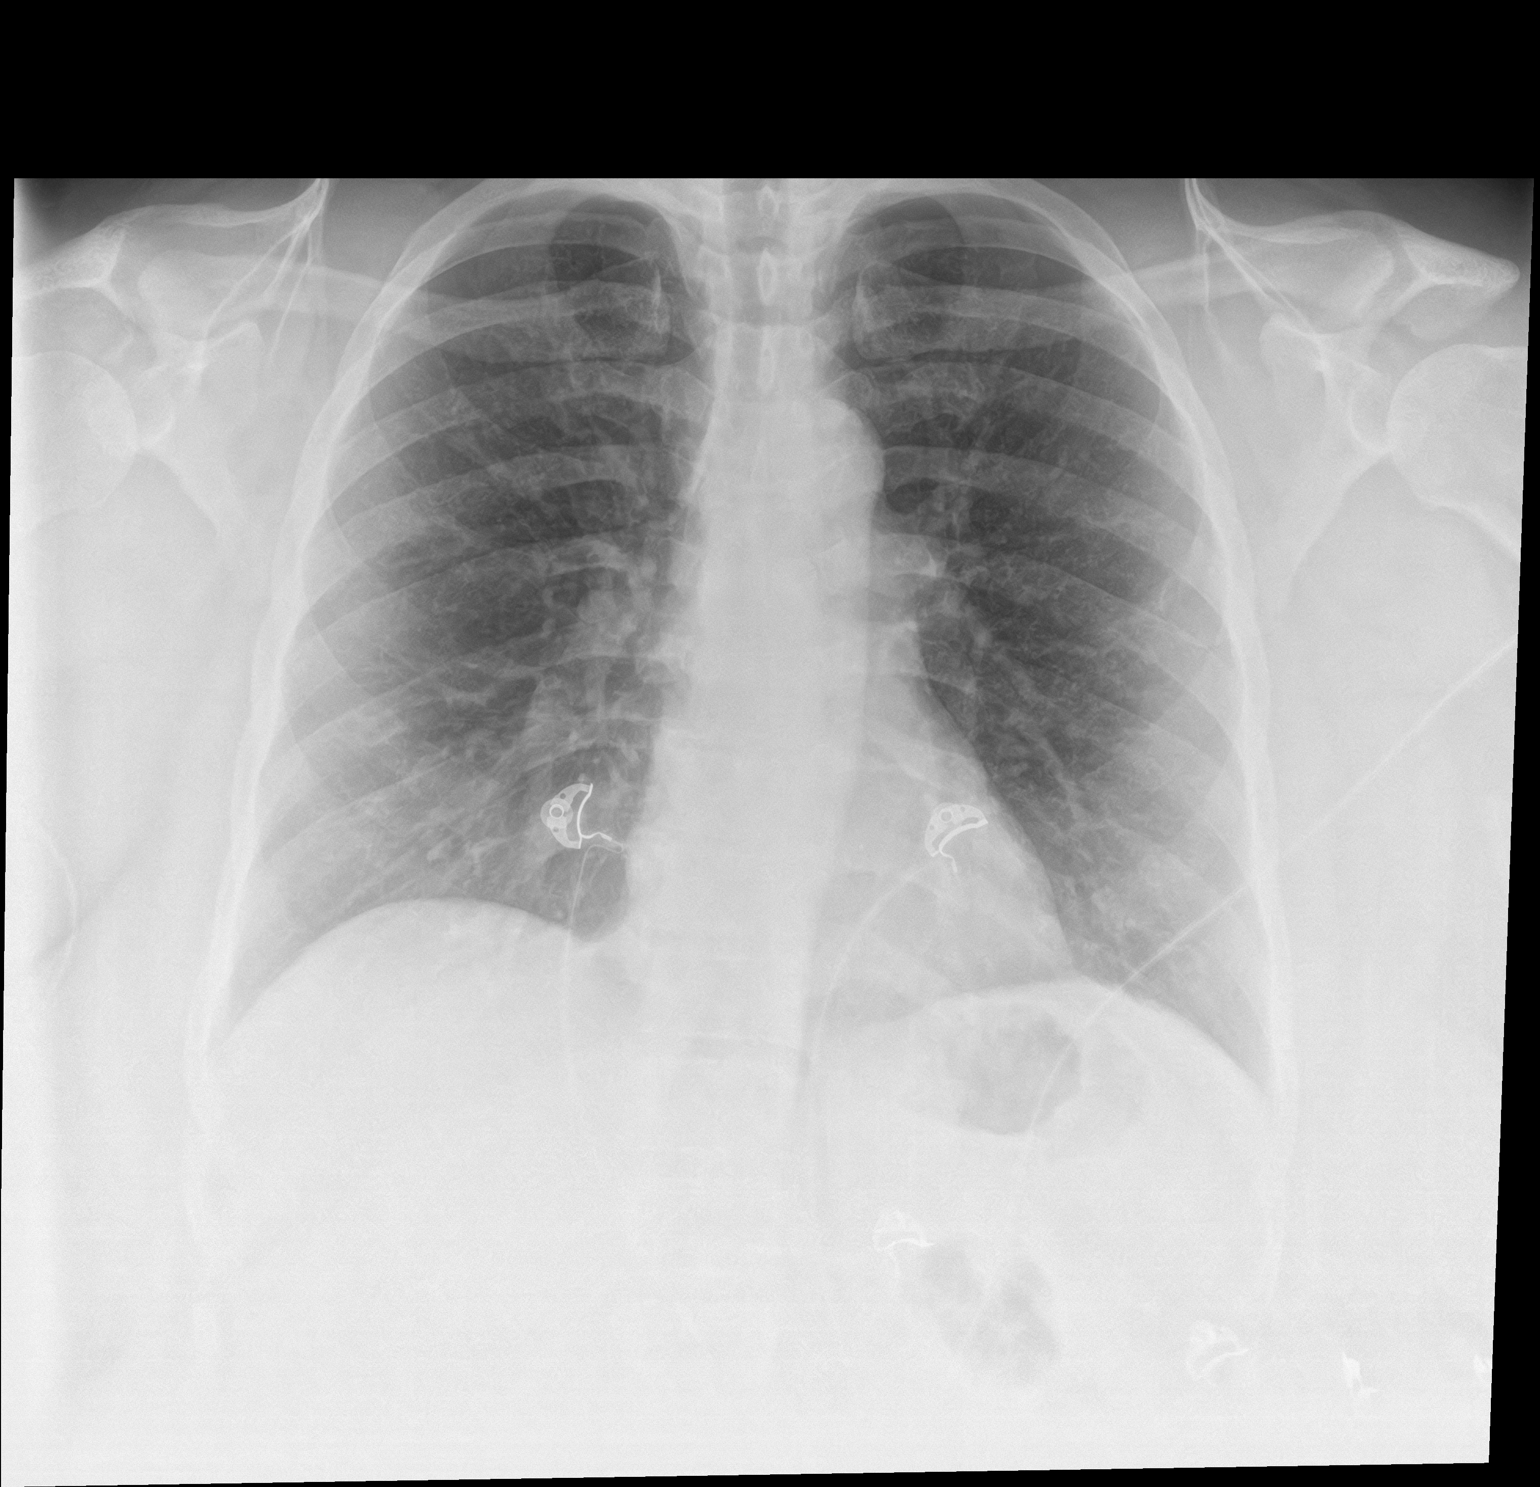

[chest lat]
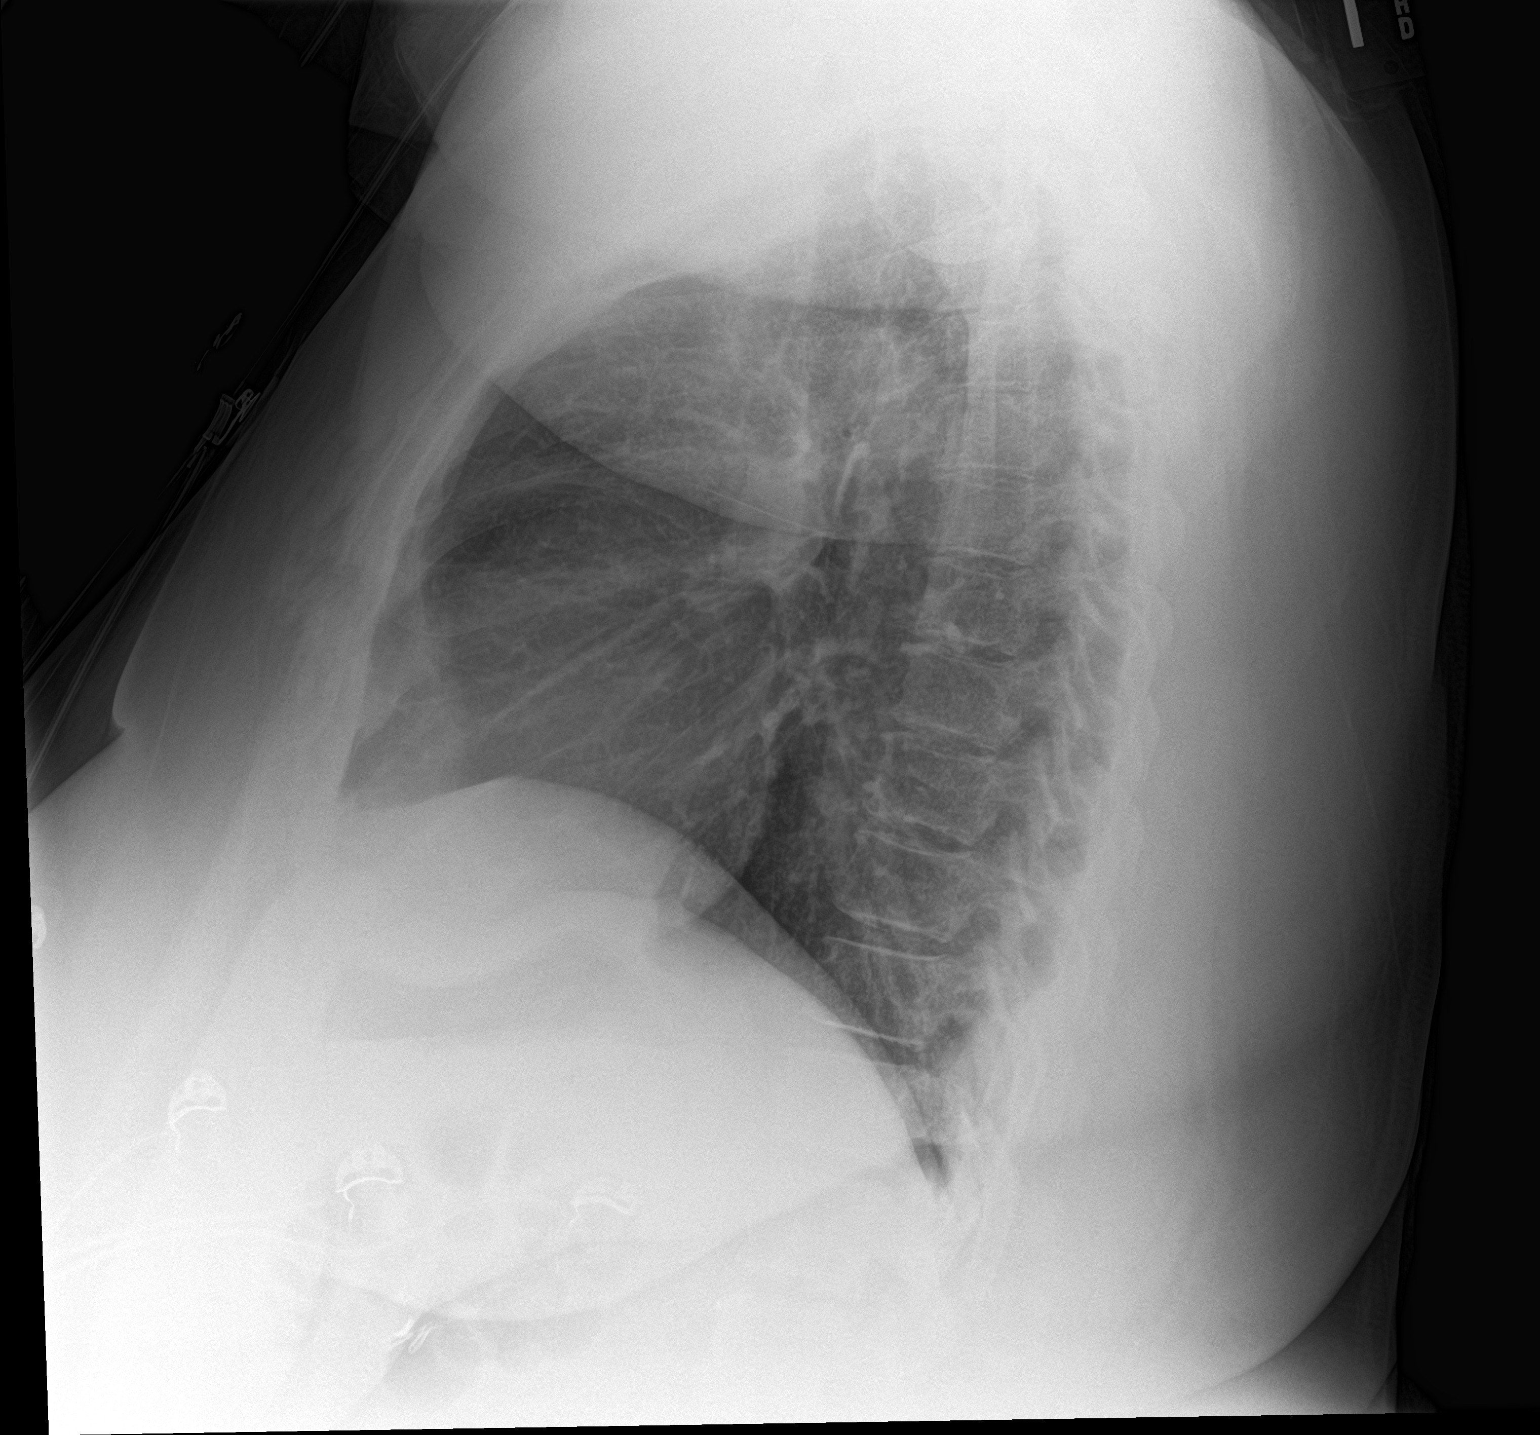

[2 of 2 positions shown; findings below may reference images not displayed]

FINDINGS: The heart size and mediastinal contours are within normal limits.
Both lungs are clear. The visualized skeletal structures are
unremarkable.
IMPRESSION: No active cardiopulmonary disease.

## 2018-07-30 ENCOUNTER — Encounter: Payer: Self-pay | Admitting: Family Medicine

## 2018-07-30 ENCOUNTER — Ambulatory Visit (INDEPENDENT_AMBULATORY_CARE_PROVIDER_SITE_OTHER): Payer: Medicaid Other | Admitting: Family Medicine

## 2018-07-30 VITALS — BP 155/91 | HR 74 | Temp 97.9°F | Ht 65.7 in | Wt 337.1 lb

## 2018-07-30 DIAGNOSIS — G4700 Insomnia, unspecified: Secondary | ICD-10-CM | POA: Insufficient documentation

## 2018-07-30 DIAGNOSIS — R609 Edema, unspecified: Secondary | ICD-10-CM | POA: Diagnosis not present

## 2018-07-30 DIAGNOSIS — Z1231 Encounter for screening mammogram for malignant neoplasm of breast: Secondary | ICD-10-CM

## 2018-07-30 DIAGNOSIS — F5104 Psychophysiologic insomnia: Secondary | ICD-10-CM

## 2018-07-30 DIAGNOSIS — F411 Generalized anxiety disorder: Secondary | ICD-10-CM | POA: Diagnosis not present

## 2018-07-30 DIAGNOSIS — E559 Vitamin D deficiency, unspecified: Secondary | ICD-10-CM | POA: Diagnosis not present

## 2018-07-30 DIAGNOSIS — G8929 Other chronic pain: Secondary | ICD-10-CM

## 2018-07-30 DIAGNOSIS — R5383 Other fatigue: Secondary | ICD-10-CM

## 2018-07-30 DIAGNOSIS — Z13220 Encounter for screening for lipoid disorders: Secondary | ICD-10-CM

## 2018-07-30 DIAGNOSIS — F339 Major depressive disorder, recurrent, unspecified: Secondary | ICD-10-CM

## 2018-07-30 DIAGNOSIS — E213 Hyperparathyroidism, unspecified: Secondary | ICD-10-CM

## 2018-07-30 DIAGNOSIS — N200 Calculus of kidney: Secondary | ICD-10-CM

## 2018-07-30 DIAGNOSIS — M25561 Pain in right knee: Secondary | ICD-10-CM

## 2018-07-30 DIAGNOSIS — F329 Major depressive disorder, single episode, unspecified: Secondary | ICD-10-CM

## 2018-07-30 DIAGNOSIS — M25562 Pain in left knee: Secondary | ICD-10-CM

## 2018-07-30 DIAGNOSIS — Z1239 Encounter for other screening for malignant neoplasm of breast: Secondary | ICD-10-CM

## 2018-07-30 LAB — UA/M W/RFLX CULTURE, ROUTINE
Bilirubin, UA: NEGATIVE
GLUCOSE, UA: NEGATIVE
KETONES UA: NEGATIVE
LEUKOCYTES UA: NEGATIVE
Nitrite, UA: NEGATIVE
Protein, UA: NEGATIVE
RBC UA: NEGATIVE
SPEC GRAV UA: 1.02 (ref 1.005–1.030)
UUROB: 1 mg/dL (ref 0.2–1.0)
pH, UA: 6 (ref 5.0–7.5)

## 2018-07-30 MED ORDER — DICLOFENAC SODIUM 1 % TD GEL: 4.0000 g | Freq: Four times a day (QID) | TRANSDERMAL | 12 refills | Status: DC

## 2018-07-30 NOTE — Patient Instructions (Signed)
Edema Edema is an abnormal buildup of fluids in your bodytissues. Edema is somewhatdependent on gravity to pull the fluid to the lowest place in your body. That makes the condition more common in the legs and thighs (lower extremities). Painless swelling of the feet and ankles is common and becomes more likely as you get older. It is also common in looser tissues, like around your eyes. When the affected area is squeezed, the fluid may move out of that spot and leave a dent for a few moments. This dent is called pitting. What are the causes? There are many possible causes of edema. Eating too much salt and being on your feet or sitting for a long time can cause edema in your legs and ankles. Hot weather may make edema worse. Common medical causes of edema include:  Heart failure.  Liver disease.  Kidney disease.  Weak blood vessels in your legs.  Cancer.  An injury.  Pregnancy.  Some medications.  Obesity.  What are the signs or symptoms? Edema is usually painless.Your skin may look swollen or shiny. How is this diagnosed? Your health care provider may be able to diagnose edema by asking about your medical history and doing a physical exam. You may need to have tests such as X-rays, an electrocardiogram, or blood tests to check for medical conditions that may cause edema. How is this treated? Edema treatment depends on the cause. If you have heart, liver, or kidney disease, you need the treatment appropriate for these conditions. General treatment may include:  Elevation of the affected body part above the level of your heart.  Compression of the affected body part. Pressure from elastic bandages or support stockings squeezes the tissues and forces fluid back into the blood vessels. This keeps fluid from entering the tissues.  Restriction of fluid and salt intake.  Use of a water pill (diuretic). These medications are appropriate only for some types of edema. They pull fluid  out of your body and make you urinate more often. This gets rid of fluid and reduces swelling, but diuretics can have side effects. Only use diuretics as directed by your health care provider.  Follow these instructions at home:  Keep the affected body part above the level of your heart when you are lying down.  Do not sit still or stand for prolonged periods.  Do not put anything directly under your knees when lying down.  Do not wear constricting clothing or garters on your upper legs.  Exercise your legs to work the fluid back into your blood vessels. This may help the swelling go down.  Wear elastic bandages or support stockings to reduce ankle swelling as directed by your health care provider.  Eat a low-salt diet to reduce fluid if your health care provider recommends it.  Only take medicines as directed by your health care provider. Contact a health care provider if:  Your edema is not responding to treatment.  You have heart, liver, or kidney disease and notice symptoms of edema.  You have edema in your legs that does not improve after elevating them.  You have sudden and unexplained weight gain. Get help right away if:  You develop shortness of breath or chest pain.  You cannot breathe when you lie down.  You develop pain, redness, or warmth in the swollen areas.  You have heart, liver, or kidney disease and suddenly get edema.  You have a fever and your symptoms suddenly get worse. This information is   not intended to replace advice given to you by your health care provider. Make sure you discuss any questions you have with your health care provider. Document Released: 12/04/2005 Document Revised: 05/11/2016 Document Reviewed: 09/26/2013 Elsevier Interactive Patient Education  2017 [employer]  

## 2018-07-30 NOTE — Assessment & Plan Note (Signed)
Likely due to her mental health issues. Will start her on trazodone and try to get her into see psychiatry. Call with any concerns. Recheck 3-4 weeks.

## 2018-07-30 NOTE — Assessment & Plan Note (Signed)
Labs drawn today. Await results.  

## 2018-07-30 NOTE — Assessment & Plan Note (Signed)
Will get her into ortho. Referral generated today. On xarelto for DVT- cannot take oral NSAIDs- will try voltaren gel. Continue tylenol. Work on weight loss.

## 2018-07-30 NOTE — Assessment & Plan Note (Signed)
Not under good control. Has tried and failed several medicines. Will get her into psychiatry. Referral generated today. Call with any concerns.

## 2018-07-30 NOTE — Assessment & Plan Note (Signed)
UA clear today. Await results. Call with any concerns.

## 2018-07-30 NOTE — Progress Notes (Signed)
BP (!) 155/91 (BP Location: Left Arm, Patient Position: Sitting, Cuff Size: Large)   Pulse 74   Temp 97.9 F (36.6 C)   Ht 5' 5.7" (1.669 m)   Wt (!) 337 lb 2 oz (152.9 kg)   SpO2 97%   BMI 54.91 kg/m    Subjective:    Patient ID: Carol Ortega, female    DOB: 03-08-1971, 47 y.o.   MRN: [id number]  HPI: Carol Ortega is a 47 y.o. female  Chief Complaint  Patient presents with  . Edema  . Knee Pain  . Depression  . Fatigue   Has been having a lot of swelling since she went to [place] on July first. She notes that she was swelling in her feet, legs and belly. She propped them up while she was up there. She notes that she didn't get better while she was in [place], but since she got back on Aug 1, she notes that she has been peeing a lot since she got back. Has been having a really irritated urethra. No burning when she pees. No pain in her belly, + frequency, + back pain, + urgency, no hematuria, no foul smell.   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 Has pain in both her knees, R>L. Had a lot of crepitus. Went to see orthopedics before she moved here and had a steroid injection. She notes that the steroid shot made her feel worse. She was very short of breath with it. Has been using tylenol for it. She notes that it's worse with going up and down the stairs. Notes that it feels like it's going to give out on her.   Did not tolerate the wellbutrin. It made her feel really bad. She notes that her mind became more active with the wellbutrin. Kept her from sleeping. She notes that she is just sleeping during the day. She is up all night. She is sleeping a couple of hours with some naps during the day. She is sleeping when she shouldn't be sleeping and not sleeping when she should.   DEPRESSION Mood status: uncontrolled Satisfied with current treatment?: no Symptom severity: severe  Duration of current treatment : chronic Side effects: yes Medication compliance: poor  compliance Psychotherapy/counseling: no  Previous psychiatric medications: seroquel, lexapro, cymbalta, buspar, ativan, wellbutrin Depressed mood: yes Anxious mood: yes Anhedonia: yes Significant weight loss or gain: yes Insomnia: yes hard to fall asleep and stay asleep Fatigue: yes Feelings of worthlessness or guilt: yes Impaired concentration/indecisiveness: yes Suicidal ideations: no Hopelessness: yes Crying spells: yes Depression screen Carol Ortega 2/9 07/30/2018 05/09/2018  Decreased Interest 3 3  Down, Depressed, Hopeless 2 3  PHQ - 2 Score 5 6  Altered sleeping 3 3  Tired, decreased energy 3 3  Change in appetite 3 2  Feeling bad or failure about yourself  2 3  Trouble concentrating 3 3  Moving slowly or fidgety/restless 3 3  Suicidal thoughts 0 2  PHQ-9 Score 22 25  Difficult doing work/chores Very difficult Very difficult   GAD 7 : Generalized Anxiety Score 07/30/2018 05/09/2018  Nervous, Anxious, on Edge 3 3  Control/stop worrying 3 3  Worry too much - different things 3 3  Trouble relaxing 3 3  Restless 3 3  Easily annoyed or irritable 3 3  Afraid - awful might happen 3 3  Total GAD 7 Score 21 21  Anxiety Difficulty Extremely difficult Extremely difficult   FATIGUE Duration:  2  months Severity: severe  Onset: gradual Context when symptoms started:  unknown Symptoms improve with rest: no  Depressive symptoms: yes Stress/anxiety: yes Insomnia: yes  hard to fall asleep and stay asleep Snoring: yes Observed apnea by bed partner: no Daytime hypersomnolence:yes Wakes feeling refreshed: no History of sleep study: no Dysnea on exertion:  no Orthopnea/PND: no Chest pain: no Chronic cough: no Lower extremity edema: no Arthralgias:no Myalgias: no Weakness: no Rash: no   Relevant past medical, surgical, family and social history reviewed and updated as indicated. Interim medical history since our last visit reviewed. Allergies and medications reviewed and  updated.  Review of Systems  Constitutional: Negative.   Respiratory: Negative.   Cardiovascular: Positive for leg swelling. Negative for chest pain and palpitations.  Gastrointestinal: Negative.   Musculoskeletal: Positive for arthralgias, joint swelling and myalgias. Negative for back pain, gait problem, neck pain and neck stiffness.  Skin: Negative.   Neurological: Negative.   Psychiatric/Behavioral: Positive for agitation, behavioral problems, dysphoric mood and sleep disturbance. Negative for confusion, decreased concentration, hallucinations, self-injury and suicidal ideas. The patient is nervous/anxious. The patient is not hyperactive.     Per HPI unless specifically indicated above     Objective:    BP (!) 155/91 (BP Location: Left Arm, Patient Position: Sitting, Cuff Size: Large)   Pulse 74   Temp 97.9 F (36.6 C)   Ht 5' 5.7" (1.669 m)   Wt (!) 337 lb 2 oz (152.9 kg)   SpO2 97%   BMI 54.91 kg/m   Wt Readings from Last 3 Encounters:  07/30/18 (!) 337 lb 2 oz (152.9 kg)  05/15/18 (!) 343 lb (155.6 kg)  05/09/18 (!) 338 lb 6.4 oz (153.5 kg)    Physical Exam  Constitutional: She is oriented to person, place, and time. She appears well-developed and well-nourished. No distress.  HENT:  Head: Normocephalic and atraumatic.  Right Ear: Hearing normal.  Left Ear: Hearing normal.  Nose: Nose normal.  Eyes: Conjunctivae and lids are normal. Right eye exhibits no discharge. Left eye exhibits no discharge. No scleral icterus.  Cardiovascular: Normal rate, regular rhythm, normal heart sounds and intact distal pulses. Exam reveals no gallop and no friction rub.  No murmur heard. Pulmonary/Chest: Effort normal and breath sounds normal. No stridor. No respiratory distress. She has no wheezes. She has no rales. She exhibits no tenderness.  Musculoskeletal: Normal range of motion.  Neurological: She is alert and oriented to person, place, and time.  Skin: Skin is warm, dry and  intact. Capillary refill takes less than 2 seconds. No rash noted. She is not diaphoretic. No erythema. No pallor.  Psychiatric: She has a normal mood and affect. Her speech is normal and behavior is normal. Judgment and thought content normal. Cognition and memory are normal.  Nursing note and vitals reviewed.   Results for orders placed or performed in visit on 05/09/18  CBC with Differential/Platelet  Result Value Ref Range   WBC 6.8 3.4 - 10.8 x10E3/uL   RBC 4.48 3.77 - 5.28 x10E6/uL   Hemoglobin 12.1 11.1 - 15.9 g/dL   Hematocrit 39.6 34.0 - 46.6 %   MCV 88 79 - 97 fL   MCH 27.0 26.6 - 33.0 pg   MCHC 30.6 (L) 31.5 - 35.7 g/dL   RDW 14.9 12.3 - 15.4 %   Platelets 334 150 - 450 x10E3/uL   Neutrophils 53 Not Estab. %   Lymphs 34 Not Estab. %   Monocytes 10 Not Estab. %  Eos 2 Not Estab. %   Basos 1 Not Estab. %   Neutrophils Absolute 3.5 1.4 - 7.0 x10E3/uL   Lymphocytes Absolute 2.3 0.7 - 3.1 x10E3/uL   Monocytes Absolute 0.7 0.1 - 0.9 x10E3/uL   EOS (ABSOLUTE) 0.2 0.0 - 0.4 x10E3/uL   Basophils Absolute 0.0 0.0 - 0.2 x10E3/uL   Immature Granulocytes 0 Not Estab. %   Immature Grans (Abs) 0.0 0.0 - 0.1 x10E3/uL  Carol Ortega  Result Value Ref Range   HB A1C (Carol DCA - Ortega) 5.8 <7.0 %  Comprehensive metabolic panel  Result Value Ref Range   Glucose 89 65 - 99 mg/dL   BUN 9 6 - 24 mg/dL   Creatinine, Ser 0.83 0.57 - 1.00 mg/dL   GFR calc non Af Amer 85 >59 mL/min/1.73   GFR calc Af Amer 98 >59 mL/min/1.73   BUN/Creatinine Ratio 11 9 - 23   Sodium 141 134 - 144 mmol/L   Potassium 4.2 3.5 - 5.2 mmol/L   Chloride 103 96 - 106 mmol/L   CO2 25 20 - 29 mmol/L   Calcium 9.6 8.7 - 10.2 mg/dL   Total Protein 7.1 6.0 - 8.5 g/dL   Albumin 3.9 3.5 - 5.5 g/dL   Globulin, Total 3.2 1.5 - 4.5 g/dL   Albumin/Globulin Ratio 1.2 1.2 - 2.2   Bilirubin Total 0.3 0.0 - 1.2 mg/dL   Alkaline Phosphatase 88 39 - 117 IU/L   AST 8 0 - 40 IU/L   ALT 7 0 - 32 IU/L  Lipid  Panel w/o Chol/HDL Ratio  Result Value Ref Range   Cholesterol, Total 219 (H) 100 - 199 mg/dL   Triglycerides 126 0 - 149 mg/dL   HDL 45 >39 mg/dL   VLDL Cholesterol Cal 25 5 - 40 mg/dL   LDL Calculated 149 (H) 0 - 99 mg/dL  VITAMIN D 25 Hydroxy (Vit-D Deficiency, Fractures)  Result Value Ref Range   Vit D, 25-Hydroxy 11.6 (L) 30.0 - 100.0 ng/mL  Thyroid Panel With TSH  Result Value Ref Range   TSH 1.860 0.450 - 4.500 uIU/mL   T4, Total 6.3 4.5 - 12.0 ug/dL   T3 Uptake Ratio 21 (L) 24 - 39 %   Free Thyroxine Index 1.3 1.2 - 4.9  CoaguChek XS/INR Ortega  Result Value Ref Range   INR 1.1 0.9 - 1.1   Prothrombin Time 13.3 sec  UA/M w/rflx Culture, Routine  Result Value Ref Range   Specific Gravity, UA 1.020 1.005 - 1.030   pH, UA 7.0 5.0 - 7.5   Color, UA Orange Yellow   Appearance Ur Hazy (A) Clear   Leukocytes, UA Negative Negative   Protein, UA Trace (A) Negative/Trace   Glucose, UA Negative Negative   Ketones, UA Trace (A) Negative   RBC, UA Negative Negative   Bilirubin, UA Negative Negative   Urobilinogen, Ur 4.0 (H) 0.2 - 1.0 mg/dL   Nitrite, UA Negative Negative  Uric acid  Result Value Ref Range   Uric Acid 4.4 2.5 - 7.1 mg/dL  PTH, Intact and Calcium  Result Value Ref Range   Calcium 9.5 8.7 - 10.2 mg/dL   PTH 50 15 - 65 pg/mL   PTH Interp Comment       Assessment & Plan:   Problem List Items Addressed This Visit      Endocrine   Hyperparathyroidism (Carol Ortega)    Labs drawn today. Await results.       Relevant Orders  CBC with Differential/Platelet   Comprehensive metabolic panel   TSH   UA/M w/rflx Culture, Routine   PTH, Intact and Calcium     Genitourinary   Kidney stone    UA clear today. Await results. Call with any concerns.       Relevant Orders   CBC with Differential/Platelet   Comprehensive metabolic panel   TSH   UA/M w/rflx Culture, Routine     Other   Vitamin D deficiency (Chronic)    Labs drawn today. Await results.        Relevant Orders   CBC with Differential/Platelet   Comprehensive metabolic panel   TSH   UA/M w/rflx Culture, Routine   VITAMIN D 25 Hydroxy (Vit-D Deficiency, Fractures)   Hypercalcemia    Labs drawn today. Await results.       Relevant Orders   CBC with Differential/Platelet   Comprehensive metabolic panel   TSH   UA/M w/rflx Culture, Routine   Depression, recurrent (Carol Ortega)    Not under good control. Has tried and failed several medicines. Will get her into psychiatry. Referral generated today. Call with any concerns.       Relevant Orders   CBC with Differential/Platelet   Comprehensive metabolic panel   TSH   UA/M w/rflx Culture, Routine   Ambulatory referral to Psychiatry   GAD (generalized anxiety disorder)    Not under good control. Has tried and failed several medicines. Will get her into psychiatry. Referral generated today. Call with any concerns.       Relevant Orders   CBC with Differential/Platelet   Comprehensive metabolic panel   TSH   UA/M w/rflx Culture, Routine   Ambulatory referral to Psychiatry   Insomnia    Likely due to her mental health issues. Will start her on trazodone and try to get her into see psychiatry. Call with any concerns. Recheck 3-4 weeks.       Chronic pain of both knees    Will get her into ortho. Referral generated today. On xarelto for DVT- cannot take oral NSAIDs- will try voltaren gel. Continue tylenol. Work on weight loss.       Relevant Orders   Ambulatory referral to Orthopedic Surgery    Other Visit Diagnoses    Peripheral edema    -  Primary   No sign of edema today. Encouraged compression stockings and elevation. Watch salt intake. Call with any concerns.    Screening for breast cancer       Mammogram ordered today.   Relevant Orders   MM DIGITAL SCREENING BILATERAL   Screening for cholesterol level       Labs drawn today. Await results.    Relevant Orders   Lipid Panel w/o Chol/HDL Ratio   Fatigue due to  depression       Will get her into see psychiatry. Call with any concerns.        Follow up plan: Return in about 4 weeks (around 08/27/2018) for Physical.

## 2018-07-31 ENCOUNTER — Other Ambulatory Visit: Payer: Self-pay | Admitting: Family Medicine

## 2018-07-31 LAB — CBC WITH DIFFERENTIAL/PLATELET
Basophils Absolute: 0 10*3/uL (ref 0.0–0.2)
Basos: 1 %
EOS (ABSOLUTE): 0.2 10*3/uL (ref 0.0–0.4)
EOS: 3 %
HEMATOCRIT: 40.1 % (ref 34.0–46.6)
HEMOGLOBIN: 12.8 g/dL (ref 11.1–15.9)
IMMATURE GRANULOCYTES: 0 %
Immature Grans (Abs): 0 10*3/uL (ref 0.0–0.1)
LYMPHS ABS: 2.4 10*3/uL (ref 0.7–3.1)
Lymphs: 38 %
MCH: 26.9 pg (ref 26.6–33.0)
MCHC: 31.9 g/dL (ref 31.5–35.7)
MCV: 84 fL (ref 79–97)
MONOCYTES: 12 %
Monocytes Absolute: 0.8 10*3/uL (ref 0.1–0.9)
NEUTROS PCT: 46 %
Neutrophils Absolute: 3 10*3/uL (ref 1.4–7.0)
Platelets: 280 10*3/uL (ref 150–450)
RBC: 4.75 x10E6/uL (ref 3.77–5.28)
RDW: 15.1 % (ref 12.3–15.4)
WBC: 6.5 10*3/uL (ref 3.4–10.8)

## 2018-07-31 LAB — VITAMIN D 25 HYDROXY (VIT D DEFICIENCY, FRACTURES): Vit D, 25-Hydroxy: 14.2 ng/mL — ABNORMAL LOW (ref 30.0–100.0)

## 2018-07-31 LAB — COMPREHENSIVE METABOLIC PANEL
ALBUMIN: 3.9 g/dL (ref 3.5–5.5)
ALT: 10 IU/L (ref 0–32)
AST: 12 IU/L (ref 0–40)
Albumin/Globulin Ratio: 1.2 (ref 1.2–2.2)
Alkaline Phosphatase: 83 IU/L (ref 39–117)
BUN / CREAT RATIO: 12 (ref 9–23)
BUN: 8 mg/dL (ref 6–24)
Bilirubin Total: 0.5 mg/dL (ref 0.0–1.2)
CALCIUM: 9.2 mg/dL (ref 8.7–10.2)
CO2: 24 mmol/L (ref 20–29)
CREATININE: 0.68 mg/dL (ref 0.57–1.00)
Chloride: 103 mmol/L (ref 96–106)
GFR calc Af Amer: 120 mL/min/{1.73_m2} (ref >59)
GFR, EST NON AFRICAN AMERICAN: 105 mL/min/{1.73_m2} (ref >59)
GLOBULIN, TOTAL: 3.3 g/dL (ref 1.5–4.5)
Glucose: 95 mg/dL (ref 65–99)
Potassium: 4.3 mmol/L (ref 3.5–5.2)
SODIUM: 140 mmol/L (ref 134–144)
TOTAL PROTEIN: 7.2 g/dL (ref 6.0–8.5)

## 2018-07-31 LAB — LIPID PANEL W/O CHOL/HDL RATIO
Cholesterol, Total: 236 mg/dL — ABNORMAL HIGH (ref 100–199)
HDL: 47 mg/dL (ref >39)
LDL CALC: 172 mg/dL — AB (ref 0–99)
Triglycerides: 86 mg/dL (ref 0–149)
VLDL Cholesterol Cal: 17 mg/dL (ref 5–40)

## 2018-07-31 LAB — TSH: TSH: 2.06 u[IU]/mL (ref 0.450–4.500)

## 2018-07-31 LAB — PTH, INTACT AND CALCIUM: PTH: 84 pg/mL — AB (ref 15–65)

## 2018-07-31 MED ORDER — VITAMIN D (ERGOCALCIFEROL) 1.25 MG (50000 UNIT) PO CAPS: 50000.0000 [IU] | ORAL_CAPSULE | ORAL | 1 refills | Status: DC

## 2018-08-15 ENCOUNTER — Encounter (INDEPENDENT_AMBULATORY_CARE_PROVIDER_SITE_OTHER): Payer: Medicaid Other

## 2018-08-15 ENCOUNTER — Ambulatory Visit (INDEPENDENT_AMBULATORY_CARE_PROVIDER_SITE_OTHER): Payer: Medicaid Other | Admitting: Vascular Surgery

## 2018-09-05 ENCOUNTER — Encounter: Payer: Medicaid Other | Admitting: Family Medicine

## 2018-09-11 ENCOUNTER — Ambulatory Visit (INDEPENDENT_AMBULATORY_CARE_PROVIDER_SITE_OTHER): Payer: Medicaid Other | Admitting: Psychiatry

## 2018-09-11 ENCOUNTER — Encounter: Payer: Self-pay | Admitting: Psychiatry

## 2018-09-11 ENCOUNTER — Other Ambulatory Visit: Payer: Self-pay

## 2018-09-11 VITALS — BP 164/102 | HR 91 | Temp 98.1°F | Wt 339.4 lb

## 2018-09-11 DIAGNOSIS — F3161 Bipolar disorder, current episode mixed, mild: Secondary | ICD-10-CM

## 2018-09-11 DIAGNOSIS — F4310 Post-traumatic stress disorder, unspecified: Secondary | ICD-10-CM

## 2018-09-11 MED ORDER — ARIPIPRAZOLE 5 MG PO TABS: 5.0000 mg | ORAL_TABLET | Freq: Every day | ORAL | 0 refills | Status: DC

## 2018-09-11 MED ORDER — PROPRANOLOL HCL 10 MG PO TABS: 10.0000 mg | ORAL_TABLET | Freq: Three times a day (TID) | ORAL | 0 refills | Status: DC | PRN

## 2018-09-11 MED ORDER — TRAZODONE HCL 50 MG PO TABS: 50.0000 mg | ORAL_TABLET | Freq: Every day | ORAL | 0 refills | Status: DC

## 2018-09-11 NOTE — Patient Instructions (Addendum)
Propranolol tablets  What is this medicine?  PROPRANOLOL (proe PRAN oh lole) is a beta-blocker. Beta-blockers reduce the workload on the heart and help it to beat more regularly. This medicine is used to treat high blood pressure, to control irregular heart rhythms (arrhythmias) and to relieve chest pain caused by angina. It may also be helpful after a heart attack. This medicine is also used to prevent migraine headaches, relieve uncontrollable shaking (tremors), and help certain problems related to the thyroid gland and adrenal gland.  This medicine may be used for other purposes; ask your health care provider or pharmacist if you have questions.  COMMON BRAND NAME(S): Inderal  What should I tell my health care provider before I take this medicine?  They need to know if you have any of these conditions:  -circulation problems or blood vessel disease  -diabetes  -history of heart attack or heart disease, vasospastic angina  -kidney disease  -liver disease  -lung or breathing disease, like asthma or emphysema  -pheochromocytoma  -slow heart rate  -thyroid disease  -an unusual or allergic reaction to propranolol, other beta-blockers, medicines, foods, dyes, or preservatives  -pregnant or trying to get pregnant  -breast-feeding  How should I use this medicine?  Take this medicine by mouth with a glass of water. Follow the directions on the prescription label. Take your doses at regular intervals. Do not take your medicine more often than directed. Do not stop taking except on your the advice of your doctor or health care professional.  Talk to your pediatrician regarding the use of this medicine in children. Special care may be needed.  Overdosage: If you think you have taken too much of this medicine contact a poison control center or emergency room at once.  NOTE: This medicine is only for you. Do not share this medicine with others.  What if I miss a dose?  If you miss a dose, take it as soon as you can. If it is  almost time for your next dose, take only that dose. Do not take double or extra doses.  What may interact with this medicine?  Do not take this medicine with any of the following medications:  -feverfew  -phenothiazines like chlorpromazine, mesoridazine, prochlorperazine, thioridazine  This medicine may also interact with the following medications:  -aluminum hydroxide gel  -antipyrine  -antiviral medicines for HIV or AIDS  -barbiturates like phenobarbital  -certain medicines for blood pressure, heart disease, irregular heart beat  -cimetidine  -ciprofloxacin  -diazepam  -fluconazole  -haloperidol  -isoniazid  -medicines for cholesterol like cholestyramine or colestipol  -medicines for mental depression  -medicines for migraine headache like almotriptan, eletriptan, frovatriptan, naratriptan, rizatriptan, sumatriptan, zolmitriptan  -NSAIDs, medicines for pain and inflammation, like ibuprofen or naproxen  -phenytoin  -rifampin  -teniposide  -theophylline  -thyroid medicines  -tolbutamide  -warfarin  -zileuton  This list may not describe all possible interactions. Give your health care provider a list of all the medicines, herbs, non-prescription drugs, or dietary supplements you use. Also tell them if you smoke, drink alcohol, or use illegal drugs. Some items may interact with your medicine.  What should I watch for while using this medicine?  Visit your doctor or health care professional for regular check ups. Check your blood pressure and pulse rate regularly. Ask your health care professional what your blood pressure and pulse rate should be, and when you should contact them.  You may get drowsy or dizzy. Do not drive, use machinery, or   you have diabetes, check with your doctor or health care professional before you change your diet or the dose of your diabetic medicine. Do not treat yourself for coughs, colds, or pain while you are taking this medicine without asking your doctor or health care professional for advice. Some ingredients may increase your blood pressure. What side effects may I notice from receiving this medicine? Side effects that you should report to your doctor or health care professional as soon as possible: -allergic reactions like skin rash, itching or hives, swelling of the face, lips, or tongue -breathing problems -changes in blood sugar -cold hands or feet -difficulty sleeping, nightmares -dry peeling skin -hallucinations -muscle cramps or weakness -slow heart rate -swelling of the legs and ankles -vomiting Side effects that usually do not require medical attention (report to your doctor or health care professional if they continue or are bothersome): -change in sex drive or performance -diarrhea -dry sore eyes -hair loss -nausea -weak or tired This list may not describe all possible side effects. Call your doctor for medical advice about side effects. You may report side effects to FDA at 1-800-FDA-1088. Where should I keep my medicine? Keep out of the reach of children. Store at room temperature between 15 and 30 degrees C (59 and 86 degrees F). Protect from light. Throw away any unused medicine after the expiration date. NOTE: This sheet is a summary. It may not cover all possible information. If you have questions about this medicine, talk to your doctor, pharmacist, or health care provider.  2018 [employer] Standard (2013-08-08 14:51:53) Trazodone tablets What is this medicine? TRAZODONE (TRAZ oh done) is used to treat depression. This medicine may be used for other purposes; ask your health care provider or pharmacist if you have  questions. COMMON BRAND NAME(S): Desyrel What should I tell my health care provider before I take this medicine? They need to know if you have any of these conditions: -attempted suicide or thinking about it -bipolar disorder -bleeding problems -glaucoma -heart disease, or previous heart attack -irregular heart beat -kidney or liver disease -low levels of sodium in the blood -an unusual or allergic reaction to trazodone, other medicines, foods, dyes or preservatives -pregnant or trying to get pregnant -breast-feeding How should I use this medicine? Take this medicine by mouth with a glass of water. Follow the directions on the prescription label. Take this medicine shortly after a meal or a light snack. Take your medicine at regular intervals. Do not take your medicine more often than directed. Do not stop taking this medicine suddenly except upon the advice of your doctor. Stopping this medicine too quickly may cause serious side effects or your condition may worsen. A special MedGuide will be given to you by the pharmacist with each prescription and refill. Be sure to read this information carefully each time. Talk to your pediatrician regarding the use of this medicine in children. Special care may be needed. Overdosage: If you think you have taken too much of this medicine contact a poison control center or emergency room at once. NOTE: This medicine is only for you. Do not share this medicine with others. What if I miss a dose? If you miss a dose, take it as soon as you can. If it is almost time for your next dose, take only that dose. Do not take double or extra doses. What may interact with this medicine? Do not take this medicine with any of the following medications: -certain medicines for fungal infections like  fluconazole, itraconazole, ketoconazole, posaconazole, voriconazole -cisapride -dofetilide -dronedarone -linezolid -MAOIs like Carbex, Eldepryl, Marplan, Nardil, and  Parnate -mesoridazine -methylene blue (injected into a vein) -pimozide -saquinavir -thioridazine -ziprasidone This medicine may also interact with the following medications: -alcohol -antiviral medicines for HIV or AIDS -aspirin and aspirin-like medicines -barbiturates like phenobarbital -certain medicines for blood pressure, heart disease, irregular heart beat -certain medicines for depression, anxiety, or psychotic disturbances -certain medicines for migraine headache like almotriptan, eletriptan, frovatriptan, naratriptan, rizatriptan, sumatriptan, zolmitriptan -certain medicines for seizures like carbamazepine and phenytoin -certain medicines for sleep -certain medicines that treat or prevent blood clots like dalteparin, enoxaparin, warfarin -digoxin -fentanyl -lithium -NSAIDS, medicines for pain and inflammation, like ibuprofen or naproxen -other medicines that prolong the QT interval (cause an abnormal heart rhythm) -rasagiline -supplements like St. John's wort, kava kava, valerian -tramadol -tryptophan This list may not describe all possible interactions. Give your health care provider a list of all the medicines, herbs, non-prescription drugs, or dietary supplements you use. Also tell them if you smoke, drink alcohol, or use illegal drugs. Some items may interact with your medicine. What should I watch for while using this medicine? Tell your doctor if your symptoms do not get better or if they get worse. Visit your doctor or health care professional for regular checks on your progress. Because it may take several weeks to see the full effects of this medicine, it is important to continue your treatment as prescribed by your doctor. Patients and their families should watch out for new or worsening thoughts of suicide or depression. Also watch out for sudden changes in feelings such as feeling anxious, agitated, panicky, irritable, hostile, aggressive, impulsive, severely  restless, overly excited and hyperactive, or not being able to sleep. If this happens, especially at the beginning of treatment or after a change in dose, call your health care professional. Dennis Bast may get drowsy or dizzy. Do not drive, use machinery, or do anything that needs mental alertness until you know how this medicine affects you. Do not stand or sit up quickly, especially if you are an older patient. This reduces the risk of dizzy or fainting spells. Alcohol may interfere with the effect of this medicine. Avoid alcoholic drinks. This medicine may cause dry eyes and blurred vision. If you wear contact lenses you may feel some discomfort. Lubricating drops may help. See your eye doctor if the problem does not go away or is severe. Your mouth may get dry. Chewing sugarless gum, sucking hard candy and drinking plenty of water may help. Contact your doctor if the problem does not go away or is severe. What side effects may I notice from receiving this medicine? Side effects that you should report to your doctor or health care professional as soon as possible: -allergic reactions like skin rash, itching or hives, swelling of the face, lips, or tongue -elevated mood, decreased need for sleep, racing thoughts, impulsive behavior -confusion -fast, irregular heartbeat -feeling faint or lightheaded, falls -feeling agitated, angry, or irritable -loss of balance or coordination -painful or prolonged erections -restlessness, pacing, inability to keep still -suicidal thoughts or other mood changes -tremors -trouble sleeping -seizures -unusual bleeding or bruising Side effects that usually do not require medical attention (report to your doctor or health care professional if they continue or are bothersome): -change in sex drive or performance -change in appetite or weight -constipation -headache -muscle aches or pains -nausea This list may not describe all possible side effects. Call your doctor  for medical advice  about side effects. You may report side effects to FDA at 1-800-FDA-1088. Where should I keep my medicine? Keep out of the reach of children. Store at room temperature between 15 and 30 degrees C (59 to 86 degrees F). Protect from light. Keep container tightly closed. Throw away any unused medicine after the expiration date. NOTE: This sheet is a summary. It may not cover all possible information. If you have questions about this medicine, talk to your doctor, pharmacist, or health care provider.  2018 [employer] Standard (2016-05-04 16:57:05) Aripiprazole tablets What is this medicine? ARIPIPRAZOLE (ay ri PIP ray zole) is an atypical antipsychotic. It is used to treat schizophrenia and bipolar disorder, also known as manic-depression. It is also used to treat Tourette's disorder and some symptoms of autism. This medicine may also be used in combination with antidepressants to treat major depressive disorder. This medicine may be used for other purposes; ask your health care provider or pharmacist if you have questions. COMMON BRAND NAME(S): Abilify What should I tell my health care provider before I take this medicine? They need to know if you have any of these conditions: -dehydration -dementia -diabetes -heart disease -history of stroke -low blood counts, like low white cell, platelet, or red cell counts -Parkinson's disease -seizures -suicidal thoughts, plans, or attempt; a previous suicide attempt by you or a family member -an unusual or allergic reaction to aripiprazole, other medicines, foods, dyes, or preservatives -pregnant or trying to get pregnant -breast-feeding How should I use this medicine? Take this medicine by mouth with a glass of water. Follow the directions on the prescription label. You can take this medicine with or without food. Take your doses at regular intervals. Do not take your medicine more often than directed. Do not stop taking except on  the advice of your doctor or health care professional. A special MedGuide will be given to you by the pharmacist with each prescription and refill. Be sure to read this information carefully each time. Talk to your pediatrician regarding the use of this medicine in children. While this drug may be prescribed for children as young as 87 years of age for selected conditions, precautions do apply. Overdosage: If you think you have taken too much of this medicine contact a poison control center or emergency room at once. NOTE: This medicine is only for you. Do not share this medicine with others. What if I miss a dose? If you miss a dose, take it as soon as you can. If it is almost time for your next dose, take only that dose. Do not take double or extra doses. What may interact with this medicine? Do not take this medicine with any of the following medications: -brexpiprazole -cisapride -dofetilide -dronedarone -metoclopramide -pimozide -thioridazine This medicine may also interact with the following medications: -alcohol -carbamazepine -certain medicines for anxiety or sleep -certain medicines for blood pressure -certain medicines for fungal infections like ketoconazole, fluconazole, posaconazole, and itraconazole -clarithromycin -fluoxetine -other medicines that prolong the QT interval (cause an abnormal heart rhythm) -paroxetine -quinidine -rifampin This list may not describe all possible interactions. Give your health care provider a list of all the medicines, herbs, non-prescription drugs, or dietary supplements you use. Also tell them if you smoke, drink alcohol, or use illegal drugs. Some items may interact with your medicine. What should I watch for while using this medicine? Visit your doctor or health care professional for regular checks on your progress. It may be several weeks before you see the full  effects of this medicine. Do not suddenly stop taking this medicine. You may  need to gradually reduce the dose. Patients and their families should watch out for worsening depression or thoughts of suicide. Also watch out for sudden changes in feelings such as feeling anxious, agitated, panicky, irritable, hostile, aggressive, impulsive, severely restless, overly excited and hyperactive, or not being able to sleep. If this happens, especially at the beginning of antidepressant treatment or after a change in dose, call your health care professional. Dennis Bast may get dizzy or drowsy. Do not drive, use machinery, or do anything that needs mental alertness until you know how this medicine affects you. Do not stand or sit up quickly, especially if you are an older patient. This reduces the risk of dizzy or fainting spells. Alcohol can increase dizziness and drowsiness. Avoid alcoholic drinks. This medicine can reduce the response of your body to heat or cold. Dress warm in cold weather and stay hydrated in hot weather. If possible, avoid extreme temperatures like saunas, hot tubs, very hot or cold showers, or activities that can cause dehydration such as vigorous exercise. This medicine may cause dry eyes and blurred vision. If you wear contact lenses you may feel some discomfort. Lubricating drops may help. See your eye doctor if the problem does not go away or is severe. If you notice an increased hunger or thirst, different from your normal hunger or thirst, or if you find that you have to urinate more frequently, you should contact your health care provider as soon as possible. You may need to have your blood sugar monitored. This medicine may cause changes in your blood sugar levels. You should monitor you blood sugar frequently if you have diabetes. There have been reports of uncontrollable and strong urges to gamble, binge eat, shop, and have sex while taking this medicine. If you experience any of these or other uncontrollable and strong urges while taking this medicine, you should report  it to your health care provider as soon as possible. What side effects may I notice from receiving this medicine? Side effects that you should report to your doctor or health care professional as soon as possible: -allergic reactions like skin rash, itching or hives, swelling of the face, lips, or tongue -breathing problems -confusion -feeling faint or lightheaded, falls -fever or chills, sore throat -increased hunger or thirst -increased urination -joint pain -muscles pain, spasms -problems with balance, talking, walking -restlessness or need to keep moving -seizures -suicidal thoughts or other mood changes -trouble swallowing -uncontrollable and excessive urges (examples: gambling, binge eating, shopping, having sex) -uncontrollable head, mouth, neck, arm, or leg movements -unusually weak or tired Side effects that usually do not require medical attention (report to your doctor or health care professional if they continue or are bothersome): -blurred vision -constipation -headache -nausea, vomiting -trouble sleeping -weight gain This list may not describe all possible side effects. Call your doctor for medical advice about side effects. You may report side effects to FDA at 1-800-FDA-1088. Where should I keep my medicine? Keep out of the reach of children. Store at room temperature between 15 and 30 degrees C (59 and 86 degrees F). Throw away any unused medicine after the expiration date. NOTE: This sheet is a summary. It may not cover all possible information. If you have questions about this medicine, talk to your doctor, pharmacist, or health care provider.  2018 [employer] Standard (2016-11-19 11:45:05)
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
## 2018-09-17 ENCOUNTER — Ambulatory Visit (INDEPENDENT_AMBULATORY_CARE_PROVIDER_SITE_OTHER): Payer: Medicaid Other

## 2018-09-17 ENCOUNTER — Encounter (INDEPENDENT_AMBULATORY_CARE_PROVIDER_SITE_OTHER): Payer: Self-pay | Admitting: Vascular Surgery

## 2018-09-17 ENCOUNTER — Ambulatory Visit (INDEPENDENT_AMBULATORY_CARE_PROVIDER_SITE_OTHER): Payer: Medicaid Other | Admitting: Vascular Surgery

## 2018-09-17 VITALS — BP 137/85 | HR 77 | Resp 18 | Ht 66.0 in | Wt 343.0 lb

## 2018-09-17 DIAGNOSIS — Z7901 Long term (current) use of anticoagulants: Secondary | ICD-10-CM

## 2018-09-17 DIAGNOSIS — E785 Hyperlipidemia, unspecified: Secondary | ICD-10-CM | POA: Diagnosis not present

## 2018-09-17 DIAGNOSIS — I82431 Acute embolism and thrombosis of right popliteal vein: Secondary | ICD-10-CM

## 2018-09-17 DIAGNOSIS — I10 Essential (primary) hypertension: Secondary | ICD-10-CM

## 2018-09-17 NOTE — Progress Notes (Signed)
Subjective:    Patient ID: Carol Ortega, female    DOB: 23-Nov-1971, 47 y.o.   MRN: [id number] Chief Complaint  Patient presents with  . Follow-up    3 month DVT Study   The patient presents for three month DVT follow-up.  The patient continues to take Xarelto on a daily basis.  The patient denies any worsening right lower extremity pain or edema.  Patient denies any shortness of breath or chest pain.  The patient continues to engage in conservative therapy including wearing medical grade one compression socks, elevating her legs and remaining active.  The patient underwent a right lower extremity venous duplex study which was notable for an improvement in the right small saphenous vein thrombo-phlebitis when compared to the previous exam on May 15, 2018.  There is no evidence of deep vein thrombosis in the right lower extremity.  The patient denies any fever, nausea vomiting.  Patient denies any ulcer formation to the right lower extremity.  Review of Systems  Constitutional: Negative.   HENT: Negative.   Eyes: Negative.   Respiratory: Negative.   Cardiovascular:       DVT  Gastrointestinal: Negative.   Endocrine: Negative.   Genitourinary: Negative.   Musculoskeletal: Negative.   Skin: Negative.   Allergic/Immunologic: Negative.   Neurological: Negative.   Hematological: Negative.   Psychiatric/Behavioral: Negative.       Objective:   Physical Exam  Constitutional: She is oriented to person, place, and time. She appears well-developed and well-nourished. No distress.  HENT:  Head: Normocephalic and atraumatic.  Right Ear: External ear normal.  Left Ear: External ear normal.  Eyes: Pupils are equal, round, and reactive to light. Conjunctivae and EOM are normal.  Neck: Normal range of motion.  Cardiovascular: Normal rate, regular rhythm, normal heart sounds and intact distal pulses.  Pulses:      Radial pulses are 2+ on the right side, and 2+ on the left side.  Unable to  palpate pedal pulses due to body habitus and edema however the bilateral feet are warm  Pulmonary/Chest: Effort normal and breath sounds normal.  Musculoskeletal: Normal range of motion. She exhibits edema (Bilateral lower extremity nonpitting edema noted).  Neurological: She is alert and oriented to person, place, and time.  Skin: Skin is warm and dry. She is not diaphoretic.  Psychiatric: She has a normal mood and affect. Her behavior is normal. Judgment and thought content normal.  Vitals reviewed.  BP 137/85 (BP Location: Right Arm, Patient Position: Sitting)   Pulse 77   Resp 18   Ht 5\' 6"  (1.676 m)   Wt (!) 343 lb (155.6 kg)   LMP 02/15/2018 (LMP Unknown)   BMI 55.36 kg/m   Past Medical History:  Diagnosis Date  . Anemia   . Anxiety   . Asthma    seasonal  . Benign brain tumor ([place])   . Brain tumor ([place]) 2014   tx with radiation  . Cancer ([place])   . Complication of anesthesia 1999   lung collapse after surgery for gallbladder  . Depression   . Ectopic pregnancy   . Headache    migraines  . Hoarseness of voice   . Hypertension   . Kidney stone   . Obesity   . Parathyroid abnormality ([place])   . Pulmonary emboli ([place]) 2011  . Pulmonary embolism ([place])   . Radiation    Brain tumor  . UTI (urinary tract infection)   . Vitamin D deficiency  Social History   Socioeconomic History  . Marital status: Divorced    Spouse name: Not on file  . Number of children: 4  . Years of education: Not on file  . Highest education level: Some college, no degree  Occupational History  . Not on file  Social Needs  . Financial resource strain: Somewhat hard  . Food insecurity:    Worry: Often true    Inability: Often true  . Transportation needs:    Medical: Yes    Non-medical: Yes  Tobacco Use  . Smoking status: Never Smoker  . Smokeless tobacco: Never Used  Substance and Sexual Activity  . Alcohol use: Not Currently    Comment: soical  . Drug use: No  . Sexual  activity: Not Currently  Lifestyle  . Physical activity:    Days per week: 0 days    Minutes per session: 0 min  . Stress: Very much  Relationships  . Social connections:    Talks on phone: More than three times a week    Gets together: More than three times a week    Attends religious service: Never    Active member of club or organization: No    Attends meetings of clubs or organizations: Never    Relationship status: Divorced  . Intimate partner violence:    Fear of current or ex partner: No    Emotionally abused: No    Physically abused: No    Forced sexual activity: No  Other Topics Concern  . Not on file  Social History Narrative   Lives at home with her son, independent at baselinepend   Past Surgical History:  Procedure Laterality Date  . CHOLECYSTECTOMY  1999  . CYSTOSCOPY W/ RETROGRADES Left 03/01/2018   Procedure: CYSTOSCOPY WITH RETROGRADE PYELOGRAM;  Surgeon: Abbie Sons, MD;  Location: [facility];  Service: Urology;  Laterality: Left;  . CYSTOSCOPY W/ URETERAL STENT PLACEMENT Right 04/08/2017   Procedure: CYSTOSCOPY WITH RETROGRADE PYELOGRAM/URETERAL STENT PLACEMENT;  Surgeon: Rana Snare, MD;  Location: [facility];  Service: Urology;  Laterality: Right;  . CYSTOSCOPY W/ URETERAL STENT PLACEMENT Left 03/12/2018   Procedure: CYSTOSCOPY WITH STENT REPLACEMENT;  Surgeon: Abbie Sons, MD;  Location: [facility];  Service: Urology;  Laterality: Left;  . CYSTOSCOPY W/ URETEROSCOPY    . CYSTOSCOPY WITH RETROGRADE PYELOGRAM, URETEROSCOPY AND STENT PLACEMENT Left 01/20/2014   Procedure: LEFT URETEROSCOPY WITH STENT PLACEMENT;  Surgeon: Irine Seal, MD;  Location: [facility];  Service: Urology;  Laterality: Left;  . CYSTOSCOPY WITH RETROGRADE PYELOGRAM, URETEROSCOPY AND STENT PLACEMENT Bilateral 04/23/2015   Procedure: CYSTOSCOPY WITH BILATERAL RETROGRADE PYELOGRAM, URETEROSCOPY AND STENT PLACEMENT;  Surgeon: Alexis Frock, MD;  Location: [facility];  Service: Urology;  Laterality:  Bilateral;  . CYSTOSCOPY WITH RETROGRADE PYELOGRAM, URETEROSCOPY AND STENT PLACEMENT Right 04/13/2017   Procedure: CYSTOSCOPY WITH RETROGRADE PYELOGRAM, URETEROSCOPY AND STENT EXCHANGE;  Surgeon: Alexis Frock, MD;  Location: [facility];  Service: Urology;  Laterality: Right;  . CYSTOSCOPY WITH STENT PLACEMENT Left 01/12/2014   Procedure: CYSTOSCOPY WITH STENT PLACEMENT left retrograde;  Surgeon: Irine Seal, MD;  Location: [facility];  Service: Urology;  Laterality: Left;  . CYSTOSCOPY WITH STENT PLACEMENT Left 03/01/2018   Procedure: CYSTOSCOPY WITH STENT PLACEMENT;  Surgeon: Abbie Sons, MD;  Location: [facility];  Service: Urology;  Laterality: Left;  . CYSTOSCOPY/URETEROSCOPY/HOLMIUM LASER/STENT PLACEMENT Left 03/12/2018   Procedure: CYSTOSCOPY/URETEROSCOPY/HOLMIUM LASER;  Surgeon: Abbie Sons, MD;  Location: [facility];  Service: Urology;  Laterality: Left;  .  HOLMIUM LASER APPLICATION Left 02/17/2950   Procedure: HOLMIUM LASER APPLICATION;  Surgeon: Irine Seal, MD;  Location: [facility];  Service: Urology;  Laterality: Left;  . HOLMIUM LASER APPLICATION Bilateral 07/25/4165   Procedure: HOLMIUM LASER APPLICATION;  Surgeon: Alexis Frock, MD;  Location: [facility];  Service: Urology;  Laterality: Bilateral;  . HOLMIUM LASER APPLICATION Right 0/63/0160   Procedure: HOLMIUM LASER APPLICATION;  Surgeon: Alexis Frock, MD;  Location: [facility];  Service: Urology;  Laterality: Right;  . kidney stone removal    . LITHOTRIPSY    . PARATHYROIDECTOMY Right 11/13/2016   Procedure: RIGHT SUPERIOR PARATHYROIDECTOMY;  Surgeon: Armandina Gemma, MD;  Location: [facility];  Service: General;  Laterality: Right;  . surgery for,ectopic pregnancy  2011    1 fallopian tube rupture  . TUBAL LIGATION  2011   Family History  Problem Relation Age of Onset  . Bell's palsy Mother   . Heart failure Mother   . Hypertension Mother   . Lupus Mother   . Anxiety disorder Mother   . Depression Mother   . Stroke Mother   . Asthma Father     . Hypertension Father   . Hyperlipidemia Father   . Anxiety disorder Father   . Depression Father   . Hypertension Maternal Grandmother   . Diabetes Maternal Grandmother   . Sarcoidosis Maternal Grandmother   . Anxiety disorder Sister   . Depression Sister   . Bipolar disorder Sister   . Depression Son   . Bipolar disorder Son    Allergies  Allergen Reactions  . Bee Venom Anaphylaxis  . Contrast Media [Iodinated Diagnostic Agents] Anaphylaxis  . Eggs Or Egg-Derived Products Anaphylaxis  . Other Anaphylaxis    Surgical dye.. Pt states ok to take if previously taken benadryl or prednisone  . Penicillins Hives and Other (See Comments)    Has patient had a PCN reaction causing immediate rash, facial/tongue/throat swelling, SOB or lightheadedness with hypotension: No Has patient had a PCN reaction causing severe rash involving mucus membranes or skin necrosis: No Has patient had a PCN reaction that required hospitalization No Has patient had a PCN reaction occurring within the last 10 years: No If all of the above answers are "NO", then may proceed with Cephalosporin use.  . Wellbutrin [Bupropion] Other (See Comments)    irritiability  . Latex Rash  . Oxycodone-Acetaminophen Itching      Assessment & Plan:  The patient presents for three month DVT follow-up.  The patient continues to take Xarelto on a daily basis.  The patient denies any worsening right lower extremity pain or edema.  Patient denies any shortness of breath or chest pain.  The patient continues to engage in conservative therapy including wearing medical grade one compression socks, elevating her legs and remaining active.  The patient underwent a right lower extremity venous duplex study which was notable for an improvement in the right small saphenous vein thrombo-phlebitis when compared to the previous exam on May 15, 2018.  There is no evidence of deep vein thrombosis in the right lower extremity.  The patient denies  any fever, nausea vomiting.  Patient denies any ulcer formation to the right lower extremity.  1. 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 Patient to continue compression, elevation and remaining active She does not need to undergo another venous duplex for the chronic superficial thrombosis noted in her right small saphenous vein, she notices any worsening edema or discomfort To follow-up PRN  2. Hyperlipidemia, unspecified hyperlipidemia type - Stable Encouraged good control as its slows the progression of atherosclerotic disease  3. Essential hypertension - Stable Encouraged good control as its slows the progression of atherosclerotic disease  Current Outpatient Medications on File Prior to Visit  Medication Sig Dispense Refill  . acetaminophen (TYLENOL) 500 MG tablet Take 500 mg by mouth every 6 (six) hours as needed.    Marland Kitchen albuterol (PROVENTIL HFA;VENTOLIN HFA) 108 (90 Base) MCG/ACT inhaler Inhale 2 puffs into the lungs every 6 (six) hours as needed for wheezing or shortness of breath. 1 Inhaler 2  . albuterol (PROVENTIL) (2.5 MG/3ML) 0.083% nebulizer solution Take 3 mLs (2.5 mg total) by nebulization every 6 (six) hours as needed for wheezing or shortness of breath. 75 mL 12  . ARIPiprazole (ABILIFY) 5 MG tablet Take 1 tablet (5 mg total) by mouth daily. 30 tablet 0  . diclofenac sodium (VOLTAREN) 1 % GEL Apply 4 g topically 4 (four) times daily. 100 g 12  . diphenhydrAMINE (BENADRYL) 25 MG tablet Take 25 mg by mouth daily as needed for allergies.    Marland Kitchen propranolol (INDERAL) 10 MG tablet Take 1 tablet (10 mg total) by mouth 3 (three) times daily as  needed. For severe anxiety symptoms 90 tablet 0  . rivaroxaban (XARELTO) 20 MG TABS tablet Take 1 tablet (20 mg total) by mouth daily with supper. 30 tablet 5  . traZODone (DESYREL) 50 MG tablet Take 1-2 tablets (50-100 mg total) by mouth at bedtime. For sleep 60 tablet 0  . Vitamin D, Ergocalciferol, (DRISDOL) 50000 units CAPS capsule Take 1 capsule (50,000 Units total) by mouth every 7 (seven) days. 12 capsule 1   Current Facility-Administered Medications on File Prior to Visit  Medication Dose Route Frequency Provider Last Rate Last Dose  . lidocaine (XYLOCAINE) 2 % jelly 1 application  1 application Urethral Once Stoioff, Ronda Fairly, MD       There are no Patient Instructions on file for this visit. No follow-ups on file.  Geraldyne Barraclough A Inaaya Vellucci, PA-C

## 2018-09-23 ENCOUNTER — Encounter: Payer: Self-pay | Admitting: Licensed Clinical Social Worker

## 2018-09-23 ENCOUNTER — Ambulatory Visit (INDEPENDENT_AMBULATORY_CARE_PROVIDER_SITE_OTHER): Payer: Medicaid Other | Admitting: Psychiatry

## 2018-09-23 ENCOUNTER — Ambulatory Visit (INDEPENDENT_AMBULATORY_CARE_PROVIDER_SITE_OTHER): Payer: Medicaid Other | Admitting: Licensed Clinical Social Worker

## 2018-09-23 ENCOUNTER — Other Ambulatory Visit: Payer: Self-pay

## 2018-09-23 ENCOUNTER — Encounter: Payer: Self-pay | Admitting: Psychiatry

## 2018-09-23 VITALS — BP 141/80 | HR 83 | Temp 97.8°F | Wt 342.2 lb

## 2018-09-23 DIAGNOSIS — F3161 Bipolar disorder, current episode mixed, mild: Secondary | ICD-10-CM | POA: Diagnosis not present

## 2018-09-23 DIAGNOSIS — F5105 Insomnia due to other mental disorder: Secondary | ICD-10-CM

## 2018-09-23 DIAGNOSIS — F4310 Post-traumatic stress disorder, unspecified: Secondary | ICD-10-CM | POA: Diagnosis not present

## 2018-09-23 MED ORDER — TRAZODONE HCL 150 MG PO TABS: 150.0000 mg | ORAL_TABLET | Freq: Every day | ORAL | 1 refills | Status: DC

## 2018-09-23 MED ORDER — ARIPIPRAZOLE 10 MG PO TABS
10.0000 mg | ORAL_TABLET | Freq: Every day | ORAL | 0 refills | Status: DC
Start: 2018-09-23 — End: 2021-11-11

## 2018-09-23 NOTE — Progress Notes (Signed)
[place] MD  OP Progress Note  09/23/2018 1:45 PM Carol Ortega  MRN:  [id number]  Chief Complaint: ' I am here for follow up." Chief Complaint    Follow-up; Medication Refill; Obesity     HPI: Carol Ortega is a 47 year old African-American female, divorced, lives in [place], [place], has a history of PTSD, bipolar disorder, insomnia, history of hyperparathyroidism-status post parathyroidectomy, history of renal stones, history of hypercalcemia, asthma, history of brain tumor, presented to the clinic today for a follow-up visit.  Patient reports she is tolerating the Abilify well.  She has not noticed any significant side effects.  She continues to have mood symptoms, anxiety as well as mood lability on and off.  She would like to increase her Abilify dosage today.  Patient continues to struggle with sleep problems.  She reports she started following a good sleep hygiene.  She also is currently on trazodone 100 mg.  She continues to wake up after  4-5 hours or so.  Discussed increasing her trazodone today.  She agrees with plan.  Patient denies any suicidality.  She denies any homicidality.  She denies any perceptual disturbances.  Patient's blood pressure seems to be elevated today and it was discussed with her.  She reports it may be due to being in the doctor's office.  She reports she has checked it otherwise in other locations and it has been within normal limits.  Patient has her first appointment with our therapist today.  She looks forward to the same. Visit Diagnosis:    ICD-10-CM   1. PTSD (post-traumatic stress disorder) F43.10 traZODone (DESYREL) 150 MG tablet  2. Bipolar 1 disorder, mixed, mild (HCC) F31.61 ARIPiprazole (ABILIFY) 10 MG tablet  3. Insomnia due to mental condition F51.05     Past Psychiatric History: Reviewed past psychiatric history from my progress note on 09/11/2018.  Past trials of Cymbalta, Lexapro, Effexor, Seroquel, Wellbutrin, Ambien, Klonopin.  Past Medical  History:  Past Medical History:  Diagnosis Date  . Anemia   . Anxiety   . Asthma    seasonal  . Benign brain tumor ([place])   . Brain tumor ([place]) 2014   tx with radiation  . Cancer ([place])   . Complication of anesthesia 1999   lung collapse after surgery for gallbladder  . Depression   . Ectopic pregnancy   . Headache    migraines  . Hoarseness of voice   . Hypertension   . Kidney stone   . Obesity   . Parathyroid abnormality ([place])   . Pulmonary emboli ([place]) 2011  . Pulmonary embolism ([place])   . Radiation    Brain tumor  . UTI (urinary tract infection)   . Vitamin D deficiency     Past Surgical History:  Procedure Laterality Date  . CHOLECYSTECTOMY  1999  . CYSTOSCOPY W/ RETROGRADES Left 03/01/2018   Procedure: CYSTOSCOPY WITH RETROGRADE PYELOGRAM;  Surgeon: Abbie Sons, MD;  Location: [facility];  Service: Urology;  Laterality: Left;  . CYSTOSCOPY W/ URETERAL STENT PLACEMENT Right 04/08/2017   Procedure: CYSTOSCOPY WITH RETROGRADE PYELOGRAM/URETERAL STENT PLACEMENT;  Surgeon: Rana Snare, MD;  Location: [facility];  Service: Urology;  Laterality: Right;  . CYSTOSCOPY W/ URETERAL STENT PLACEMENT Left 03/12/2018   Procedure: CYSTOSCOPY WITH STENT REPLACEMENT;  Surgeon: Abbie Sons, MD;  Location: [facility];  Service: Urology;  Laterality: Left;  . CYSTOSCOPY W/ URETEROSCOPY    . CYSTOSCOPY WITH RETROGRADE PYELOGRAM, URETEROSCOPY AND STENT PLACEMENT Left 01/20/2014   Procedure: LEFT URETEROSCOPY  WITH STENT PLACEMENT;  Surgeon: Irine Seal, MD;  Location: [facility];  Service: Urology;  Laterality: Left;  . CYSTOSCOPY WITH RETROGRADE PYELOGRAM, URETEROSCOPY AND STENT PLACEMENT Bilateral 04/23/2015   Procedure: CYSTOSCOPY WITH BILATERAL RETROGRADE PYELOGRAM, URETEROSCOPY AND STENT PLACEMENT;  Surgeon: Alexis Frock, MD;  Location: [facility];  Service: Urology;  Laterality: Bilateral;  . CYSTOSCOPY WITH RETROGRADE PYELOGRAM, URETEROSCOPY AND STENT PLACEMENT Right 04/13/2017   Procedure:  CYSTOSCOPY WITH RETROGRADE PYELOGRAM, URETEROSCOPY AND STENT EXCHANGE;  Surgeon: Alexis Frock, MD;  Location: [facility];  Service: Urology;  Laterality: Right;  . CYSTOSCOPY WITH STENT PLACEMENT Left 01/12/2014   Procedure: CYSTOSCOPY WITH STENT PLACEMENT left retrograde;  Surgeon: Irine Seal, MD;  Location: [facility];  Service: Urology;  Laterality: Left;  . CYSTOSCOPY WITH STENT PLACEMENT Left 03/01/2018   Procedure: CYSTOSCOPY WITH STENT PLACEMENT;  Surgeon: Abbie Sons, MD;  Location: [facility];  Service: Urology;  Laterality: Left;  . CYSTOSCOPY/URETEROSCOPY/HOLMIUM LASER/STENT PLACEMENT Left 03/12/2018   Procedure: CYSTOSCOPY/URETEROSCOPY/HOLMIUM LASER;  Surgeon: Abbie Sons, MD;  Location: [facility];  Service: Urology;  Laterality: Left;  . HOLMIUM LASER APPLICATION Left 0/08/3266   Procedure: HOLMIUM LASER APPLICATION;  Surgeon: Irine Seal, MD;  Location: [facility];  Service: Urology;  Laterality: Left;  . HOLMIUM LASER APPLICATION Bilateral 12/19/4578   Procedure: HOLMIUM LASER APPLICATION;  Surgeon: Alexis Frock, MD;  Location: [facility];  Service: Urology;  Laterality: Bilateral;  . HOLMIUM LASER APPLICATION Right 9/98/3382   Procedure: HOLMIUM LASER APPLICATION;  Surgeon: Alexis Frock, MD;  Location: [facility];  Service: Urology;  Laterality: Right;  . kidney stone removal    . LITHOTRIPSY    . PARATHYROIDECTOMY Right 11/13/2016   Procedure: RIGHT SUPERIOR PARATHYROIDECTOMY;  Surgeon: Armandina Gemma, MD;  Location: [facility];  Service: General;  Laterality: Right;  . surgery for,ectopic pregnancy  2011    1 fallopian tube rupture  . TUBAL LIGATION  2011    Family Psychiatric History: Have reviewed family psychiatric history from my progress note on 09/11/2018  Family History:  Family History  Problem Relation Age of Onset  . Bell's palsy Mother   . Heart failure Mother   . Hypertension Mother   . Lupus Mother   . Anxiety disorder Mother   . Depression Mother   . Stroke Mother   .  Asthma Father   . Hypertension Father   . Hyperlipidemia Father   . Anxiety disorder Father   . Depression Father   . Hypertension Maternal Grandmother   . Diabetes Maternal Grandmother   . Sarcoidosis Maternal Grandmother   . Anxiety disorder Sister   . Depression Sister   . Bipolar disorder Sister   . Depression Son   . Bipolar disorder Son     Social History: I have reviewed social history from my progress note on 09/11/2018. Social History   Socioeconomic History  . Marital status: Divorced    Spouse name: Not on file  . Number of children: 4  . Years of education: Not on file  . Highest education level: Some college, no degree  Occupational History  . Not on file  Social Needs  . Financial resource strain: Somewhat hard  . Food insecurity:    Worry: Often true    Inability: Often true  . Transportation needs:    Medical: Yes    Non-medical: Yes  Tobacco Use  . Smoking status: Never Smoker  . Smokeless tobacco: Never Used  Substance and Sexual Activity  . Alcohol use: Not  Currently    Comment: soical  . Drug use: No  . Sexual activity: Not Currently  Lifestyle  . Physical activity:    Days per week: 0 days    Minutes per session: 0 min  . Stress: Very much  Relationships  . Social connections:    Talks on phone: More than three times a week    Gets together: More than three times a week    Attends religious service: Never    Active member of club or organization: No    Attends meetings of clubs or organizations: Never    Relationship status: Divorced  Other Topics Concern  . Not on file  Social History Narrative   Lives at home with her son, independent at baselinepend    Allergies:  Allergies  Allergen Reactions  . Bee Venom Anaphylaxis  . Contrast Media [Iodinated Diagnostic Agents] Anaphylaxis  . Eggs Or Egg-Derived Products Anaphylaxis  . Other Anaphylaxis    Surgical dye.. Pt states ok to take if previously taken benadryl or prednisone  .  Penicillins Hives and Other (See Comments)    Has patient had a PCN reaction causing immediate rash, facial/tongue/throat swelling, SOB or lightheadedness with hypotension: No Has patient had a PCN reaction causing severe rash involving mucus membranes or skin necrosis: No Has patient had a PCN reaction that required hospitalization No Has patient had a PCN reaction occurring within the last 10 years: No If all of the above answers are "NO", then may proceed with Cephalosporin use.  . Wellbutrin [Bupropion] Other (See Comments)    irritiability  . Latex Rash  . Oxycodone-Acetaminophen Itching    Metabolic Disorder Labs: No results found for: HGBA1C, MPG No results found for: PROLACTIN Lab Results  Component Value Date   CHOL 236 (H) 07/30/2018   TRIG 86 07/30/2018   HDL 47 07/30/2018   LDLCALC 172 (H) 07/30/2018   LDLCALC 149 (H) 05/09/2018   Lab Results  Component Value Date   TSH 2.060 07/30/2018   TSH 1.860 05/09/2018    Therapeutic Level Labs: No results found for: LITHIUM No results found for: VALPROATE No components found for:  CBMZ  Current Medications: Current Outpatient Medications  Medication Sig Dispense Refill  . acetaminophen (TYLENOL) 500 MG tablet Take 500 mg by mouth every 6 (six) hours as needed.    Marland Kitchen albuterol (PROVENTIL HFA;VENTOLIN HFA) 108 (90 Base) MCG/ACT inhaler Inhale 2 puffs into the lungs every 6 (six) hours as needed for wheezing or shortness of breath. 1 Inhaler 2  . albuterol (PROVENTIL) (2.5 MG/3ML) 0.083% nebulizer solution Take 3 mLs (2.5 mg total) by nebulization every 6 (six) hours as needed for wheezing or shortness of breath. 75 mL 12  . diclofenac sodium (VOLTAREN) 1 % GEL Apply 4 g topically 4 (four) times daily. 100 g 12  . diphenhydrAMINE (BENADRYL) 25 MG tablet Take 25 mg by mouth daily as needed for allergies.    Marland Kitchen propranolol (INDERAL) 10 MG tablet Take 1 tablet (10 mg total) by mouth 3 (three) times daily as needed. For severe  anxiety symptoms 90 tablet 0  . rivaroxaban (XARELTO) 20 MG TABS tablet Take 1 tablet (20 mg total) by mouth daily with supper. 30 tablet 5  . Vitamin D, Ergocalciferol, (DRISDOL) 50000 units CAPS capsule Take 1 capsule (50,000 Units total) by mouth every 7 (seven) days. 12 capsule 1  . ARIPiprazole (ABILIFY) 10 MG tablet Take 1 tablet (10 mg total) by mouth daily. For mood 30 tablet  0  . traZODone (DESYREL) 150 MG tablet Take 1 tablet (150 mg total) by mouth at bedtime. 30 tablet 1   Current Facility-Administered Medications  Medication Dose Route Frequency Provider Last Rate Last Dose  . lidocaine (XYLOCAINE) 2 % jelly 1 application  1 application Urethral Once Stoioff, Ronda Fairly, MD         Musculoskeletal: Strength & Muscle Tone: within normal limits Gait & Station: normal Patient leans: N/A  Psychiatric Specialty Exam: Review of Systems  Psychiatric/Behavioral: Positive for depression. The patient is nervous/anxious and has insomnia.   All other systems reviewed and are negative.   Blood pressure (!) 141/80, pulse 83, temperature 97.8 F (36.6 C), temperature source Oral, weight (!) 342 lb 3.2 oz (155.2 kg), last menstrual period 02/15/2018.Body mass index is 55.23 kg/m.  General Appearance: Casual  Eye Contact:  Fair  Speech:  Normal Rate  Volume:  Normal  Mood:  Anxious and Depressed  Affect:  Congruent  Thought Process:  Goal Directed and Descriptions of Associations: Intact  Orientation:  Full (Time, Place, and Person)  Thought Content: Logical   Suicidal Thoughts:  No  Homicidal Thoughts:  No  Memory:  Immediate;   Fair Recent;   Fair Remote;   Fair  Judgement:  Fair  Insight:  Fair  Psychomotor Activity:  Normal  Concentration:  Concentration: Fair and Attention Span: Fair  Recall:  [employer] of Knowledge: Fair  Language: Fair  Akathisia:  No  Handed:  Right  AIMS (if indicated): na  Assets:  Communication Skills Desire for Improvement Social Support   ADL's:  Intact  Cognition: WNL  Sleep:  Poor   Screenings: GAD-7     [facility] Visit from 07/30/2018 in [facility] Office Visit from 05/09/2018 in [facility]  Total GAD-7 Score  21  21    PHQ2-9     Office Visit from 07/30/2018 in [facility] Visit from 05/09/2018 in [facility]  PHQ-2 Total Score  5  6  PHQ-9 Total Score  22  25       Assessment and Plan: Carol Ortega is a 47 year old African-American female, divorced, has a history of mood lability, PTSD, insomnia, history of hyperparathyroidism, status post parathyroidectomy, history of brain tumor currently in remission, renal stones, hypercalcemia, presented to the clinic today for a follow-up visit.  Continues to struggle with mood symptoms and sleep problems.  Will make the medication changes as noted below.  Plan Bipolar disorder Increase Abilify to 10 mg p.o. daily  PTSD Increase trazodone to 150 mg p.o. nightly Continue propranolol 10 mg p.o. 3 times daily as needed for severe anxiety symptoms Referred for CBT/trauma focused therapy.  For insomnia Increase trazodone as noted above. She will continue to make use of sleep hygiene techniques.  Patient to be referred for genesight testing today.  Follow-up in clinic in 2-3 weeks or sooner if needed  More than 50 % of the time was spent for psychoeducation and supportive psychotherapy and care coordination.  This note was generated in part or whole with voice recognition software. Voice recognition is usually quite accurate but there are transcription errors that can and very often do occur. I apologize for any typographical errors that were not detected and corrected.       Ursula Alert, MD 09/23/2018, 1:45 PM
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
## 2018-09-26 ENCOUNTER — Other Ambulatory Visit (HOSPITAL_COMMUNITY)
Admission: RE | Admit: 2018-09-26 | Discharge: 2018-09-26 | Disposition: A | Discharge status: Home / Self Care | Payer: Medicaid Other | Source: Ambulatory Visit | Attending: Family Medicine | Admitting: Family Medicine

## 2018-09-26 ENCOUNTER — Encounter: Payer: Self-pay | Admitting: Family Medicine

## 2018-09-26 ENCOUNTER — Ambulatory Visit: Payer: Medicaid Other | Admitting: Family Medicine

## 2018-09-26 VITALS — BP 141/89 | HR 52 | Temp 97.7°F | Ht 65.5 in | Wt 335.2 lb

## 2018-09-26 DIAGNOSIS — Z124 Encounter for screening for malignant neoplasm of cervix: Secondary | ICD-10-CM | POA: Insufficient documentation

## 2018-09-26 DIAGNOSIS — Z0000 Encounter for general adult medical examination without abnormal findings: Secondary | ICD-10-CM | POA: Diagnosis not present

## 2018-09-26 DIAGNOSIS — Z6841 Body Mass Index (BMI) 40.0 and over, adult: Secondary | ICD-10-CM | POA: Diagnosis not present

## 2018-09-26 DIAGNOSIS — D496 Neoplasm of unspecified behavior of brain: Secondary | ICD-10-CM | POA: Diagnosis not present

## 2018-09-26 DIAGNOSIS — Z23 Encounter for immunization: Secondary | ICD-10-CM | POA: Diagnosis not present

## 2018-09-26 LAB — UA/M W/RFLX CULTURE, ROUTINE
BILIRUBIN UA: NEGATIVE
GLUCOSE, UA: NEGATIVE
Ketones, UA: NEGATIVE
Leukocytes, UA: NEGATIVE
Nitrite, UA: NEGATIVE
Protein, UA: NEGATIVE
RBC, UA: NEGATIVE
Specific Gravity, UA: 1.025 (ref 1.005–1.030)
UUROB: 1 mg/dL (ref 0.2–1.0)
pH, UA: 5.5 (ref 5.0–7.5)

## 2018-09-26 MED ORDER — OMEPRAZOLE 20 MG PO CPDR: 20.0000 mg | DELAYED_RELEASE_CAPSULE | Freq: Every day | ORAL | 3 refills | Status: DC

## 2018-09-26 NOTE — Assessment & Plan Note (Signed)
Would like to see bariatrics. Referral made today.

## 2018-09-26 NOTE — Patient Instructions (Addendum)
[facility]  Address: [place], [name], [place] [address]  Phone: [phone]   Perimenopause Perimenopause is the time when your body begins to move into the menopause (no menstrual period for 12 straight months). It is a natural process. Perimenopause can begin 2-8 years before the menopause and usually lasts for 1 year after the menopause. During this time, your ovaries may or may not produce an egg. The ovaries vary in their production of estrogen and progesterone hormones each month. This can cause irregular menstrual periods, difficulty getting pregnant, vaginal bleeding between periods, and uncomfortable symptoms. What are the causes?  Irregular production of the ovarian hormones, estrogen and progesterone, and not ovulating every month. Other causes include:  Tumor of the pituitary gland in the brain.  Medical disease that affects the ovaries.  Radiation treatment.  Chemotherapy.  Unknown causes.  Heavy smoking and excessive alcohol intake can bring on perimenopause sooner.  What are the signs or symptoms?  Hot flashes.  Night sweats.  Irregular menstrual periods.  Decreased sex drive.  Vaginal dryness.  Headaches.  Mood swings.  Depression.  Memory problems.  Irritability.  Tiredness.  Weight gain.  Trouble getting pregnant.  The beginning of losing bone cells (osteoporosis).  The beginning of hardening of the arteries (atherosclerosis). How is this diagnosed? Your health care provider will make a diagnosis by analyzing your age, menstrual history, and symptoms. He or she will do a physical exam and note any changes in your body, especially your female organs. Female hormone tests may or may not be helpful depending on the amount of female hormones you produce and when you produce them. However, other hormone tests may be helpful to rule out other problems. How is this treated? In some cases, no  treatment is needed. The decision on whether treatment is necessary during the perimenopause should be made by you and your health care provider based on how the symptoms are affecting you and your lifestyle. Various treatments are available, such as:  Treating individual symptoms with a specific medicine for that symptom.  Herbal medicines that can help specific symptoms.  Counseling.  Group therapy.  Follow these instructions at home:  Keep track of your menstrual periods (when they occur, how heavy they are, how long between periods, and how long they last) as well as your symptoms and when they started.  Only take over-the-counter or prescription medicines as directed by your health care provider.  Sleep and rest.  Exercise.  Eat a diet that contains calcium (good for your bones) and soy (acts like the estrogen hormone).  Do not smoke.  Avoid alcoholic beverages.  Take vitamin supplements as recommended by your health care provider. Taking vitamin E may help in certain cases.  Take calcium and vitamin D supplements to help prevent bone loss.  Group therapy is sometimes helpful.  Acupuncture may help in some cases. Contact a health care provider if:  You have questions about any symptoms you are having.  You need a referral to a specialist (gynecologist, psychiatrist, or psychologist). Get help right away if:  You have vaginal bleeding.  Your period lasts longer than 8 days.  Your periods are recurring sooner than 21 days.  You have bleeding after intercourse.  You have severe depression.  You have pain when you urinate.  You have severe headaches.  You have vision problems. This information is not intended to replace advice given to you by your health care provider.  Make sure you discuss any questions you have with your health care provider. Document Released: 01/11/2005 Document Revised: 05/11/2016 Document Reviewed: 07/03/2013 Elsevier Interactive  Patient Education  2017 [employer]. Menopause and Herbal Products What is menopause? Menopause is the normal time of life when menstrual periods decrease in frequency and eventually stop completely. This process can take several years for some women. Menopause is complete when you have had an absence of menstruation for a full year since your last menstrual period. It usually occurs between the ages of 55 and 71. It is not common for menopause to begin before the age of 84. During menopause, your body stops producing the female hormones estrogen and progesterone. Common symptoms associated with this loss of hormones (vasomotor symptoms) are:  Hot flashes.  Hot flushes.  Night sweats.  Other common symptoms and complications of menopause include:  Decrease in sex drive.  Vaginal dryness and thinning of the walls of the vagina. This can make sex painful.  Dryness of the skin and development of wrinkles.  Headaches.  Tiredness.  Irritability.  Memory problems.  Weight gain.  Bladder infections.  Hair growth on the face and chest.  Inability to reproduce offspring (infertility).  Loss of density in the bones (osteoporosis) increasing your risk for breaks (fractures).  Depression.  Hardening and narrowing of the arteries (atherosclerosis). This increases your risk of heart attack and stroke.  What treatment options are available? There are many treatment choices for menopause symptoms. The most common treatment is hormone replacement therapy. Many alternative therapies for menopause are emerging, including the use of herbal products. These supplements can be found in the form of herbs, teas, oils, tinctures, and pills. Common herbal supplements for menopause are made from plants that contain phytoestrogens. Phytoestrogens are compounds that occur naturally in plants and plant products. They act like estrogen in the body. Foods and herbs that contain phytoestrogens  include:  Soy.  Flax seeds.  Red clover.  Ginseng.  What menopause symptoms may be helped if I use herbal products?  Vasomotor symptoms. These may be helped by: ? Soy. Some studies show that soy may have a moderate benefit for hot flashes. ? Black cohosh. There is limited evidence indicating this may be beneficial for hot flashes.  Symptoms that are related to heart and blood vessel disease. These may be helped by soy. Studies have shown that soy can help to lower cholesterol.  Depression. This may be helped by: ? St. John's wort. There is limited evidence that shows this may help mild to moderate depression. ? Black cohosh. There is evidence that this may help depression and mood swings.  Osteoporosis. Soy may help to decrease bone loss that is associated with menopause and may prevent osteoporosis. Limited evidence indicates that red clover may offer some bone loss protection as well. Other herbal products that are commonly used during menopause lack enough evidence to support their use as a replacement for conventional menopause therapies. These products include evening primrose, ginseng, and red clover. What are the cases when herbal products should not be used during menopause? Do not use herbal products during menopause without your health care provider's approval if:  You are taking medicine.  You have a preexisting liver condition.  Are there any risks in my taking herbal products during menopause? If you choose to use herbal products to help with symptoms of menopause, keep in mind that:  Different supplements have different and unmeasured amounts of herbal ingredients.  Herbal products are  not regulated the same way that medicines are.  Concentrations of herbs may vary depending on the way they are prepared. For example, the concentration may be different in a pill, tea, oil, and tincture.  Little is known about the risks of using herbal products, particularly the  risks of long-term use.  Some herbal supplements can be harmful when combined with certain medicines.  Most commonly reported side effects of herbal products are mild. However, if used improperly, many herbal supplements can cause serious problems. Talk to your health care provider before starting any herbal product. If problems develop, stop taking the supplement and let your health care provider know. This information is not intended to replace advice given to you by your health care provider. Make sure you discuss any questions you have with your health care provider. Document Released: 05/22/2008 Document Revised: 10/31/2016 Document Reviewed: 05/19/2014 Elsevier Interactive Patient Education  2017 [employer]. Tdap Vaccine (Tetanus, Diphtheria and Pertussis): What You Need to Know 1. Why get vaccinated? Tetanus, diphtheria and pertussis are very serious diseases. Tdap vaccine can protect [place] from these diseases. And, Tdap vaccine given to pregnant women can protect newborn babies against pertussis. TETANUS (Lockjaw) is rare in the [place] today. It causes painful muscle tightening and stiffness, usually all over the body.  It can lead to tightening of muscles in the head and neck so you can't open your mouth, swallow, or sometimes even breathe. Tetanus kills about 1 out of 10 people who are infected even after receiving the best medical care.  DIPHTHERIA is also rare in the [place] today. It can cause a thick coating to form in the back of the throat.  It can lead to breathing problems, heart failure, paralysis, and death.  PERTUSSIS (Whooping Cough) causes severe coughing spells, which can cause difficulty breathing, vomiting and disturbed sleep.  It can also lead to weight loss, incontinence, and rib fractures. Up to 2 in 100 adolescents and 5 in 100 adults with pertussis are hospitalized or have complications, which could include pneumonia or death.  These diseases are  caused by bacteria. Diphtheria and pertussis are spread from person to person through secretions from coughing or sneezing. Tetanus enters the body through cuts, scratches, or wounds. Before vaccines, as many as 200,000 cases of diphtheria, 200,000 cases of pertussis, and hundreds of cases of tetanus, were reported in the [place] each year. Since vaccination began, reports of cases for tetanus and diphtheria have dropped by about 99% and for pertussis by about 80%. 2. Tdap vaccine Tdap vaccine can protect adolescents and adults from tetanus, diphtheria, and pertussis. One dose of Tdap is routinely given at age 3 or 39. People who did not get Tdap at that age should get it as soon as possible. Tdap is especially important for healthcare professionals and anyone having close contact with a baby younger than 12 months. Pregnant women should get a dose of Tdap during every pregnancy, to protect the newborn from pertussis. Infants are most at risk for severe, life-threatening complications from pertussis. Another vaccine, called Td, protects against tetanus and diphtheria, but not pertussis. A Td booster should be given every 10 years. Tdap may be given as one of these boosters if you have never gotten Tdap before. Tdap may also be given after a severe cut or burn to prevent tetanus infection. Your doctor or the person giving you the vaccine can give you more information. Tdap may safely be given at the same time as  other vaccines. 3. Some people should not get this vaccine  A person who has ever had a life-threatening allergic reaction after a previous dose of any diphtheria, tetanus or pertussis containing vaccine, OR has a severe allergy to any part of this vaccine, should not get Tdap vaccine. Tell the person giving the vaccine about any severe allergies.  Anyone who had coma or long repeated seizures within 7 days after a childhood dose of DTP or DTaP, or a previous dose of Tdap, should not get  Tdap, unless a cause other than the vaccine was found. They can still get Td.  Talk to your doctor if you: ? have seizures or another nervous system problem, ? had severe pain or swelling after any vaccine containing diphtheria, tetanus or pertussis, ? ever had a condition called Guillain-Barr Syndrome (GBS), ? aren't feeling well on the day the shot is scheduled. 4. Risks With any medicine, including vaccines, there is a chance of side effects. These are usually mild and go away on their own. Serious reactions are also possible but are rare. Most people who get Tdap vaccine do not have any problems with it. Mild problems following Tdap: (Did not interfere with activities)  Pain where the shot was given (about 3 in 4 adolescents or 2 in 3 adults)  Redness or swelling where the shot was given (about 1 person in 5)  Mild fever of at least 100.75F (up to about 1 in 25 adolescents or 1 in 100 adults)  Headache (about 3 or 4 people in 10)  Tiredness (about 1 person in 3 or 4)  Nausea, vomiting, diarrhea, stomach ache (up to 1 in 4 adolescents or 1 in 10 adults)  Chills, sore joints (about 1 person in 10)  Body aches (about 1 person in 3 or 4)  Rash, swollen glands (uncommon)  Moderate problems following Tdap: (Interfered with activities, but did not require medical attention)  Pain where the shot was given (up to 1 in 5 or 6)  Redness or swelling where the shot was given (up to about 1 in 16 adolescents or 1 in 12 adults)  Fever over 102F (about 1 in 100 adolescents or 1 in 250 adults)  Headache (about 1 in 7 adolescents or 1 in 10 adults)  Nausea, vomiting, diarrhea, stomach ache (up to 1 or 3 people in 100)  Swelling of the entire arm where the shot was given (up to about 1 in 500).  Severe problems following Tdap: (Unable to perform usual activities; required medical attention)  Swelling, severe pain, bleeding and redness in the arm where the shot was given  (rare).  Problems that could happen after any vaccine:  People sometimes faint after a medical procedure, including vaccination. Sitting or lying down for about 15 minutes can help prevent fainting, and injuries caused by a fall. Tell your doctor if you feel dizzy, or have vision changes or ringing in the ears.  Some people get severe pain in the shoulder and have difficulty moving the arm where a shot was given. This happens very rarely.  Any medication can cause a severe allergic reaction. Such reactions from a vaccine are very rare, estimated at fewer than 1 in a million doses, and would happen within a few minutes to a few hours after the vaccination. As with any medicine, there is a very remote chance of a vaccine causing a serious injury or death. The safety of vaccines is always being monitored. For more information, visit: http://www.aguilar.org/  5. What if there is a serious problem? What should I look for? Look for anything that concerns you, such as signs of a severe allergic reaction, very high fever, or unusual behavior. Signs of a severe allergic reaction can include hives, swelling of the face and throat, difficulty breathing, a fast heartbeat, dizziness, and weakness. These would usually start a few minutes to a few hours after the vaccination. What should I do?  If you think it is a severe allergic reaction or other emergency that can't wait, call 9-1-1 or get the person to the nearest hospital. Otherwise, call your doctor.  Afterward, the reaction should be reported to the Vaccine Adverse Event Reporting System (VAERS). Your doctor might file this report, or you can do it yourself through the VAERS web site at www.vaers.SamedayNews.es, or by calling 580-005-3284. ? VAERS does not give medical advice. 6. The National Vaccine Injury Compensation Program The [employer] Vaccine Injury Compensation Program (VICP) is a federal program that was created to compensate people who may have  been injured by certain vaccines. Persons who believe they may have been injured by a vaccine can learn about the program and about filing a claim by calling 708 360 0458 or visiting the [place] website at GoldCloset.com.ee. There is a time limit to file a claim for compensation. 7. How can I learn more?  Ask your doctor. He or she can give you the vaccine package insert or suggest other sources of information.  Call your local or state health department.  Contact the Centers for Disease Control and Prevention (CDC): ? Call 407 554 8289 (1-800-CDC-INFO) or ? Visit CDC's website at http://hunter.com/ CDC Tdap Vaccine VIS (02/10/14) This information is not intended to replace advice given to you by your health care provider. Make sure you discuss any questions you have with your health care provider. Document Released: 06/04/2012 Document Revised: 08/24/2016 Document Reviewed: 08/24/2016 Elsevier Interactive Patient Education  2017 [employer].

## 2018-09-26 NOTE — Progress Notes (Signed)
BP (!) 141/89 (BP Location: Left Arm, Patient Position: Sitting, Cuff Size: Normal)   Pulse (!) 52   Temp 97.7 F (36.5 C)   Ht 5' 5.5" (1.664 m)   Wt (!) 335 lb 4 oz (152.1 kg)   LMP 02/15/2018 (LMP Unknown)   SpO2 100%   BMI 54.94 kg/m    Subjective:    Patient ID: Carol Ortega, female    DOB: 07/26/1971, 47 y.o.   MRN: [id number]  HPI: Carol Ortega is a 47 y.o. female presenting on 09/26/2018 for comprehensive medical examination. Current medical complaints include:none  Menopausal Symptoms: yes  Depression Screen done today and results listed below:  Depression screen [facility] 2/9 07/30/2018 05/09/2018  Decreased Interest 3 3  Down, Depressed, Hopeless 2 3  PHQ - 2 Score 5 6  Altered sleeping 3 3  Tired, decreased energy 3 3  Change in appetite 3 2  Feeling bad or failure about yourself  2 3  Trouble concentrating 3 3  Moving slowly or fidgety/restless 3 3  Suicidal thoughts 0 2  PHQ-9 Score 22 25  Difficult doing work/chores Very difficult Very difficult    Past Medical History:  Past Medical History:  Diagnosis Date  . Anemia   . Anxiety   . Asthma    seasonal  . Benign brain tumor ([place])   . Brain tumor ([place]) 2014   tx with radiation  . Cancer ([place])   . Complication of anesthesia 1999   lung collapse after surgery for gallbladder  . Depression   . Ectopic pregnancy   . Headache    migraines  . Hoarseness of voice   . Hypertension   . Kidney stone   . Obesity   . Parathyroid abnormality ([place])   . Pulmonary emboli ([place]) 2011  . Pulmonary embolism ([place])   . Radiation    Brain tumor  . UTI (urinary tract infection)   . Vitamin D deficiency     Surgical History:  Past Surgical History:  Procedure Laterality Date  . CHOLECYSTECTOMY  1999  . CYSTOSCOPY W/ RETROGRADES Left 03/01/2018   Procedure: CYSTOSCOPY WITH RETROGRADE PYELOGRAM;  Surgeon: Abbie Sons, MD;  Location: [facility];  Service: Urology;  Laterality: Left;  . CYSTOSCOPY W/  URETERAL STENT PLACEMENT Right 04/08/2017   Procedure: CYSTOSCOPY WITH RETROGRADE PYELOGRAM/URETERAL STENT PLACEMENT;  Surgeon: Rana Snare, MD;  Location: [facility];  Service: Urology;  Laterality: Right;  . CYSTOSCOPY W/ URETERAL STENT PLACEMENT Left 03/12/2018   Procedure: CYSTOSCOPY WITH STENT REPLACEMENT;  Surgeon: Abbie Sons, MD;  Location: [facility];  Service: Urology;  Laterality: Left;  . CYSTOSCOPY W/ URETEROSCOPY    . CYSTOSCOPY WITH RETROGRADE PYELOGRAM, URETEROSCOPY AND STENT PLACEMENT Left 01/20/2014   Procedure: LEFT URETEROSCOPY WITH STENT PLACEMENT;  Surgeon: Irine Seal, MD;  Location: [facility];  Service: Urology;  Laterality: Left;  . CYSTOSCOPY WITH RETROGRADE PYELOGRAM, URETEROSCOPY AND STENT PLACEMENT Bilateral 04/23/2015   Procedure: CYSTOSCOPY WITH BILATERAL RETROGRADE PYELOGRAM, URETEROSCOPY AND STENT PLACEMENT;  Surgeon: Alexis Frock, MD;  Location: [facility];  Service: Urology;  Laterality: Bilateral;  . CYSTOSCOPY WITH RETROGRADE PYELOGRAM, URETEROSCOPY AND STENT PLACEMENT Right 04/13/2017   Procedure: CYSTOSCOPY WITH RETROGRADE PYELOGRAM, URETEROSCOPY AND STENT EXCHANGE;  Surgeon: Alexis Frock, MD;  Location: [facility];  Service: Urology;  Laterality: Right;  . CYSTOSCOPY WITH STENT PLACEMENT Left 01/12/2014   Procedure: CYSTOSCOPY WITH STENT PLACEMENT left retrograde;  Surgeon: Irine Seal, MD;  Location: [facility];  Service: Urology;  Laterality: Left;  .  CYSTOSCOPY WITH STENT PLACEMENT Left 03/01/2018   Procedure: CYSTOSCOPY WITH STENT PLACEMENT;  Surgeon: Abbie Sons, MD;  Location: [facility];  Service: Urology;  Laterality: Left;  . CYSTOSCOPY/URETEROSCOPY/HOLMIUM LASER/STENT PLACEMENT Left 03/12/2018   Procedure: CYSTOSCOPY/URETEROSCOPY/HOLMIUM LASER;  Surgeon: Abbie Sons, MD;  Location: [facility];  Service: Urology;  Laterality: Left;  . HOLMIUM LASER APPLICATION Left 05/24/6194   Procedure: HOLMIUM LASER APPLICATION;  Surgeon: Irine Seal, MD;  Location: [facility];  Service:  Urology;  Laterality: Left;  . HOLMIUM LASER APPLICATION Bilateral 0/08/3266   Procedure: HOLMIUM LASER APPLICATION;  Surgeon: Alexis Frock, MD;  Location: [facility];  Service: Urology;  Laterality: Bilateral;  . HOLMIUM LASER APPLICATION Right 01/11/5808   Procedure: HOLMIUM LASER APPLICATION;  Surgeon: Alexis Frock, MD;  Location: [facility];  Service: Urology;  Laterality: Right;  . kidney stone removal    . LITHOTRIPSY    . PARATHYROIDECTOMY Right 11/13/2016   Procedure: RIGHT SUPERIOR PARATHYROIDECTOMY;  Surgeon: Armandina Gemma, MD;  Location: [facility];  Service: General;  Laterality: Right;  . surgery for,ectopic pregnancy  2011    1 fallopian tube rupture  . TUBAL LIGATION  2011    Medications:  Current Outpatient Medications on File Prior to Visit  Medication Sig  . acetaminophen (TYLENOL) 500 MG tablet Take 500 mg by mouth every 6 (six) hours as needed.  Marland Kitchen albuterol (PROVENTIL HFA;VENTOLIN HFA) 108 (90 Base) MCG/ACT inhaler Inhale 2 puffs into the lungs every 6 (six) hours as needed for wheezing or shortness of breath.  Marland Kitchen albuterol (PROVENTIL) (2.5 MG/3ML) 0.083% nebulizer solution Take 3 mLs (2.5 mg total) by nebulization every 6 (six) hours as needed for wheezing or shortness of breath.  . ARIPiprazole (ABILIFY) 10 MG tablet Take 1 tablet (10 mg total) by mouth daily. For mood  . diclofenac sodium (VOLTAREN) 1 % GEL Apply 4 g topically 4 (four) times daily.  . diphenhydrAMINE (BENADRYL) 25 MG tablet Take 25 mg by mouth daily as needed for allergies.  Marland Kitchen propranolol (INDERAL) 10 MG tablet Take 1 tablet (10 mg total) by mouth 3 (three) times daily as needed. For severe anxiety symptoms  . traZODone (DESYREL) 150 MG tablet Take 1 tablet (150 mg total) by mouth at bedtime.  . Vitamin D, Ergocalciferol, (DRISDOL) 50000 units CAPS capsule Take 1 capsule (50,000 Units total) by mouth every 7 (seven) days.   Current Facility-Administered Medications on File Prior to Visit  Medication  . lidocaine  (XYLOCAINE) 2 % jelly 1 application    Allergies:  Allergies  Allergen Reactions  . Bee Venom Anaphylaxis  . Contrast Media [Iodinated Diagnostic Agents] Anaphylaxis  . Eggs Or Egg-Derived Products Anaphylaxis  . Other Anaphylaxis    Surgical dye.. Pt states ok to take if previously taken benadryl or prednisone  . Penicillins Hives and Other (See Comments)    Has patient had a PCN reaction causing immediate rash, facial/tongue/throat swelling, SOB or lightheadedness with hypotension: No Has patient had a PCN reaction causing severe rash involving mucus membranes or skin necrosis: No Has patient had a PCN reaction that required hospitalization No Has patient had a PCN reaction occurring within the last 10 years: No If all of the above answers are "NO", then may proceed with Cephalosporin use.  . Wellbutrin [Bupropion] Other (See Comments)    irritiability  . Latex Rash  . Oxycodone-Acetaminophen Itching    Social History:  Social History   Socioeconomic History  . Marital status: Divorced    Spouse name:  Not on file  . Number of children: 4  . Years of education: Not on file  . Highest education level: Some college, no degree  Occupational History  . Not on file  Social Needs  . Financial resource strain: Somewhat hard  . Food insecurity:    Worry: Often true    Inability: Often true  . Transportation needs:    Medical: Yes    Non-medical: Yes  Tobacco Use  . Smoking status: Never Smoker  . Smokeless tobacco: Never Used  Substance and Sexual Activity  . Alcohol use: Not Currently    Comment: soical  . Drug use: No  . Sexual activity: Not Currently  Lifestyle  . Physical activity:    Days per week: 0 days    Minutes per session: 0 min  . Stress: Very much  Relationships  . Social connections:    Talks on phone: More than three times a week    Gets together: More than three times a week    Attends religious service: Never    Active member of club or  organization: No    Attends meetings of clubs or organizations: Never    Relationship status: Divorced  . Intimate partner violence:    Fear of current or ex partner: No    Emotionally abused: No    Physically abused: No    Forced sexual activity: No  Other Topics Concern  . Not on file  Social History Narrative   Lives at home with her son, independent at baselinepend   Social History   Tobacco Use  Smoking Status Never Smoker  Smokeless Tobacco Never Used   Social History   Substance and Sexual Activity  Alcohol Use Not Currently   Comment: soical    Family History:  Family History  Problem Relation Age of Onset  . Bell's palsy Mother   . Heart failure Mother   . Hypertension Mother   . Lupus Mother   . Anxiety disorder Mother   . Depression Mother   . Stroke Mother   . Asthma Father   . Hypertension Father   . Hyperlipidemia Father   . Anxiety disorder Father   . Depression Father   . Hypertension Maternal Grandmother   . Diabetes Maternal Grandmother   . Sarcoidosis Maternal Grandmother   . Anxiety disorder Sister   . Depression Sister   . Bipolar disorder Sister   . Depression Son   . Bipolar disorder Son     Past medical history, surgical history, medications, allergies, family history and social history reviewed with patient today and changes made to appropriate areas of the chart.   Review of Systems  Constitutional: Positive for diaphoresis. Negative for chills, fever, malaise/fatigue and weight loss.  HENT: Positive for sore throat. Negative for congestion, ear discharge, ear pain, hearing loss, nosebleeds, sinus pain and tinnitus.   Eyes: Positive for blurred vision. Negative for double vision, photophobia, pain, discharge and redness.  Respiratory: Negative.  Negative for stridor.   Cardiovascular: Negative.   Gastrointestinal: Positive for abdominal pain and heartburn (not on anything). Negative for blood in stool, constipation, diarrhea,  melena, nausea and vomiting.  Genitourinary: Positive for frequency and urgency. Negative for dysuria, flank pain and hematuria.  Musculoskeletal: Negative.   Skin: Negative.   Neurological: Positive for dizziness and headaches. Negative for tingling, tremors, sensory change, speech change, focal weakness, seizures, loss of consciousness and weakness.  Endo/Heme/Allergies: Negative.   Psychiatric/Behavioral: Positive for depression. Negative for hallucinations, memory  loss, substance abuse and suicidal ideas. The patient is not nervous/anxious and does not have insomnia.     All other ROS negative except what is listed above and in the HPI.      Objective:    BP (!) 141/89 (BP Location: Left Arm, Patient Position: Sitting, Cuff Size: Normal)   Pulse (!) 52   Temp 97.7 F (36.5 C)   Ht 5' 5.5" (1.664 m)   Wt (!) 335 lb 4 oz (152.1 kg)   LMP 02/15/2018 (LMP Unknown)   SpO2 100%   BMI 54.94 kg/m   Wt Readings from Last 3 Encounters:  09/26/18 (!) 335 lb 4 oz (152.1 kg)  09/17/18 (!) 343 lb (155.6 kg)  07/30/18 (!) 337 lb 2 oz (152.9 kg)    Physical Exam  Constitutional: She is oriented to person, place, and time. She appears well-developed and well-nourished. No distress.  HENT:  Head: Normocephalic and atraumatic.  Right Ear: Hearing and external ear normal.  Left Ear: Hearing and external ear normal.  Nose: Nose normal.  Mouth/Throat: Oropharynx is clear and moist. No oropharyngeal exudate.  Eyes: Pupils are equal, round, and reactive to light. Conjunctivae, EOM and lids are normal. Right eye exhibits no discharge. Left eye exhibits no discharge. No scleral icterus.  Neck: Normal range of motion. Neck supple. No JVD present. No tracheal deviation present. No thyromegaly present.  Cardiovascular: Normal rate, regular rhythm, normal heart sounds and intact distal pulses. Exam reveals no gallop and no friction rub.  No murmur heard. Pulmonary/Chest: Effort normal and breath  sounds normal. No stridor. No respiratory distress. She has no wheezes. She has no rales. She exhibits no tenderness. Right breast exhibits no inverted nipple, no mass, no nipple discharge, no skin change and no tenderness. Left breast exhibits no inverted nipple, no mass, no nipple discharge, no skin change and no tenderness. No breast swelling, tenderness, discharge or bleeding. Breasts are symmetrical.  Abdominal: Soft. Bowel sounds are normal. She exhibits no distension and no mass. There is no tenderness. There is no rebound and no guarding. No hernia. Hernia confirmed negative in the right inguinal area and confirmed negative in the left inguinal area.  Genitourinary: Vagina normal. No labial fusion. There is no rash, tenderness, lesion or injury on the right labia. There is no rash, tenderness, lesion or injury on the left labia. No erythema or bleeding in the vagina. No foreign body in the vagina. No signs of injury around the vagina. No vaginal discharge found.  Musculoskeletal: Normal range of motion. She exhibits no edema, tenderness or deformity.  Lymphadenopathy:    She has no cervical adenopathy. No inguinal adenopathy noted on the right or left side.  Neurological: She is alert and oriented to person, place, and time. She displays normal reflexes. No cranial nerve deficit or sensory deficit. She exhibits normal muscle tone. Coordination normal.  Skin: Skin is warm, dry and intact. Capillary refill takes less than 2 seconds. No rash noted. She is not diaphoretic. No erythema. No pallor.  Psychiatric: She has a normal mood and affect. Her speech is normal and behavior is normal. Judgment and thought content normal. Cognition and memory are normal.  Nursing note and vitals reviewed.   Results for orders placed or performed in visit on 07/30/18  CBC with Differential/Platelet  Result Value Ref Range   WBC 6.5 3.4 - 10.8 x10E3/uL   RBC 4.75 3.77 - 5.28 x10E6/uL   Hemoglobin 12.8 11.1 -  15.9 g/dL  Hematocrit 40.1 34.0 - 46.6 %   MCV 84 79 - 97 fL   MCH 26.9 26.6 - 33.0 pg   MCHC 31.9 31.5 - 35.7 g/dL   RDW 15.1 12.3 - 15.4 %   Platelets 280 150 - 450 x10E3/uL   Neutrophils 46 Not Estab. %   Lymphs 38 Not Estab. %   Monocytes 12 Not Estab. %   Eos 3 Not Estab. %   Basos 1 Not Estab. %   Neutrophils Absolute 3.0 1.4 - 7.0 x10E3/uL   Lymphocytes Absolute 2.4 0.7 - 3.1 x10E3/uL   Monocytes Absolute 0.8 0.1 - 0.9 x10E3/uL   EOS (ABSOLUTE) 0.2 0.0 - 0.4 x10E3/uL   Basophils Absolute 0.0 0.0 - 0.2 x10E3/uL   Immature Granulocytes 0 Not Estab. %   Immature Grans (Abs) 0.0 0.0 - 0.1 x10E3/uL  Comprehensive metabolic panel  Result Value Ref Range   Glucose 95 65 - 99 mg/dL   BUN 8 6 - 24 mg/dL   Creatinine, Ser 0.68 0.57 - 1.00 mg/dL   GFR calc non Af Amer 105 >59 mL/min/1.73   GFR calc Af Amer 120 >59 mL/min/1.73   BUN/Creatinine Ratio 12 9 - 23   Sodium 140 134 - 144 mmol/L   Potassium 4.3 3.5 - 5.2 mmol/L   Chloride 103 96 - 106 mmol/L   CO2 24 20 - 29 mmol/L   Calcium 9.2 8.7 - 10.2 mg/dL   Total Protein 7.2 6.0 - 8.5 g/dL   Albumin 3.9 3.5 - 5.5 g/dL   Globulin, Total 3.3 1.5 - 4.5 g/dL   Albumin/Globulin Ratio 1.2 1.2 - 2.2   Bilirubin Total 0.5 0.0 - 1.2 mg/dL   Alkaline Phosphatase 83 39 - 117 IU/L   AST 12 0 - 40 IU/L   ALT 10 0 - 32 IU/L  Lipid Panel w/o Chol/HDL Ratio  Result Value Ref Range   Cholesterol, Total 236 (H) 100 - 199 mg/dL   Triglycerides 86 0 - 149 mg/dL   HDL 47 >39 mg/dL   VLDL Cholesterol Cal 17 5 - 40 mg/dL   LDL Calculated 172 (H) 0 - 99 mg/dL  TSH  Result Value Ref Range   TSH 2.060 0.450 - 4.500 uIU/mL  UA/M w/rflx Culture, Routine  Result Value Ref Range   Specific Gravity, UA 1.020 1.005 - 1.030   pH, UA 6.0 5.0 - 7.5   Color, UA Orange Yellow   Appearance Ur Hazy (A) Clear   Leukocytes, UA Negative Negative   Protein, UA Negative Negative/Trace   Glucose, UA Negative Negative   Ketones, UA Negative Negative   RBC,  UA Negative Negative   Bilirubin, UA Negative Negative   Urobilinogen, Ur 1.0 0.2 - 1.0 mg/dL   Nitrite, UA Negative Negative  VITAMIN D 25 Hydroxy (Vit-D Deficiency, Fractures)  Result Value Ref Range   Vit D, 25-Hydroxy 14.2 (L) 30.0 - 100.0 ng/mL  PTH, Intact and Calcium  Result Value Ref Range   PTH 84 (H) 15 - 65 pg/mL   PTH Interp Comment       Assessment & Plan:   Problem List Items Addressed This Visit      Nervous and Auditory   Brain tumor ([place])    S/P radiation. Will get back into see oncology and neurology. Call with any concerns.       Relevant Orders   Ambulatory referral to Neurology   Ambulatory referral to Hematology / Oncology     Other   Adult  body mass index 50.0-59.9 (HCC)    Would like to see bariatrics. Referral made today.      Relevant Orders   Amb Referral to Bariatric Surgery    Other Visit Diagnoses    Routine general medical examination at a health care facility    -  Primary   Vaccines up to date. Screening labs checked today. Pap done. Mammogram ordered. Continue diet and exercise.    Relevant Orders   CBC with Differential/Platelet   Comprehensive metabolic panel   Lipid Panel w/o Chol/HDL Ratio   TSH   UA/M w/rflx Culture, Routine   Screening for cervical cancer       Pap done today.   Relevant Orders   Cytology - PAP   Immunization due       Tdap given today.   Relevant Orders   Tdap vaccine greater than or equal to 7yo IM       Follow up plan: Return ASAP, for follow up.   LABORATORY TESTING:  - Pap smear: pap done  IMMUNIZATIONS:   - Tdap: Tetanus vaccination status reviewed: Tdap given today. - Influenza: Refused  SCREENING: -Mammogram: Ordered today   PATIENT COUNSELING:   Advised to take 1 mg of folate supplement per day if capable of pregnancy.   Sexuality: Discussed sexually transmitted diseases, partner selection, use of condoms, avoidance of unintended pregnancy  and contraceptive alternatives.    Advised to avoid cigarette smoking.  I discussed with the patient that most people either abstain from alcohol or drink within safe limits (<=14/week and <=4 drinks/occasion for males, <=7/weeks and <= 3 drinks/occasion for females) and that the risk for alcohol disorders and other health effects rises proportionally with the number of drinks per week and how often a drinker exceeds daily limits.  Discussed cessation/primary prevention of drug use and availability of treatment for abuse.   Diet: Encouraged to adjust caloric intake to maintain  or achieve ideal body weight, to reduce intake of dietary saturated fat and total fat, to limit sodium intake by avoiding high sodium foods and not adding table salt, and to maintain adequate dietary potassium and calcium preferably from fresh fruits, vegetables, and low-fat dairy products.    stressed the importance of regular exercise  Injury prevention: Discussed safety belts, safety helmets, smoke detector, smoking near bedding or upholstery.   Dental health: Discussed importance of regular tooth brushing, flossing, and dental visits.    NEXT PREVENTATIVE PHYSICAL DUE IN 1 YEAR. Return ASAP, for follow up.

## 2018-09-26 NOTE — Assessment & Plan Note (Signed)
S/P radiation. Will get back into see oncology and neurology. Call with any concerns.

## 2018-09-27 LAB — CBC WITH DIFFERENTIAL/PLATELET
BASOS ABS: 0.1 10*3/uL (ref 0.0–0.2)
BASOS: 1 %
EOS (ABSOLUTE): 0.3 10*3/uL (ref 0.0–0.4)
Eos: 4 %
Hematocrit: 41.2 % (ref 34.0–46.6)
Hemoglobin: 13.5 g/dL (ref 11.1–15.9)
Immature Grans (Abs): 0 10*3/uL (ref 0.0–0.1)
Immature Granulocytes: 0 %
LYMPHS ABS: 3 10*3/uL (ref 0.7–3.1)
Lymphs: 40 %
MCH: 27.4 pg (ref 26.6–33.0)
MCHC: 32.8 g/dL (ref 31.5–35.7)
MCV: 84 fL (ref 79–97)
MONOS ABS: 0.8 10*3/uL (ref 0.1–0.9)
Monocytes: 10 %
NEUTROS ABS: 3.4 10*3/uL (ref 1.4–7.0)
Neutrophils: 45 %
Platelets: 377 10*3/uL (ref 150–450)
RBC: 4.92 x10E6/uL (ref 3.77–5.28)
RDW: 13.6 % (ref 12.3–15.4)
WBC: 7.6 10*3/uL (ref 3.4–10.8)

## 2018-09-27 LAB — COMPREHENSIVE METABOLIC PANEL
ALK PHOS: 90 IU/L (ref 39–117)
ALT: 10 IU/L (ref 0–32)
AST: 13 IU/L (ref 0–40)
Albumin/Globulin Ratio: 1.3 (ref 1.2–2.2)
Albumin: 4.4 g/dL (ref 3.5–5.5)
BILIRUBIN TOTAL: 0.3 mg/dL (ref 0.0–1.2)
BUN/Creatinine Ratio: 12 (ref 9–23)
BUN: 9 mg/dL (ref 6–24)
CHLORIDE: 101 mmol/L (ref 96–106)
CO2: 26 mmol/L (ref 20–29)
Calcium: 9.7 mg/dL (ref 8.7–10.2)
Creatinine, Ser: 0.76 mg/dL (ref 0.57–1.00)
GFR calc non Af Amer: 94 mL/min/{1.73_m2} (ref >59)
GFR, EST AFRICAN AMERICAN: 108 mL/min/{1.73_m2} (ref >59)
GLUCOSE: 95 mg/dL (ref 65–99)
Globulin, Total: 3.3 g/dL (ref 1.5–4.5)
POTASSIUM: 4.4 mmol/L (ref 3.5–5.2)
Sodium: 142 mmol/L (ref 134–144)
TOTAL PROTEIN: 7.7 g/dL (ref 6.0–8.5)

## 2018-09-27 LAB — LIPID PANEL W/O CHOL/HDL RATIO
CHOLESTEROL TOTAL: 257 mg/dL — AB (ref 100–199)
HDL: 50 mg/dL (ref >39)
LDL Calculated: 188 mg/dL — ABNORMAL HIGH (ref 0–99)
Triglycerides: 96 mg/dL (ref 0–149)
VLDL Cholesterol Cal: 19 mg/dL (ref 5–40)

## 2018-09-27 LAB — TSH: TSH: 1.69 u[IU]/mL (ref 0.450–4.500)

## 2018-10-01 LAB — CYTOLOGY - PAP
DIAGNOSIS: NEGATIVE
HPV (WINDOPATH): NOT DETECTED

## 2018-10-02 ENCOUNTER — Other Ambulatory Visit: Payer: Self-pay

## 2018-10-02 ENCOUNTER — Emergency Department
Admission: EM | Admit: 2018-10-02 | Discharge: 2018-10-02 | Disposition: A | Discharge status: Home / Self Care | Payer: Medicaid Other | Attending: Emergency Medicine | Admitting: Emergency Medicine

## 2018-10-02 ENCOUNTER — Emergency Department: Payer: Medicaid Other

## 2018-10-02 ENCOUNTER — Encounter: Payer: Self-pay | Admitting: Emergency Medicine

## 2018-10-02 DIAGNOSIS — R079 Chest pain, unspecified: Secondary | ICD-10-CM

## 2018-10-02 DIAGNOSIS — R519 Headache, unspecified: Secondary | ICD-10-CM

## 2018-10-02 DIAGNOSIS — I10 Essential (primary) hypertension: Secondary | ICD-10-CM | POA: Insufficient documentation

## 2018-10-02 DIAGNOSIS — J45909 Unspecified asthma, uncomplicated: Secondary | ICD-10-CM | POA: Diagnosis not present

## 2018-10-02 DIAGNOSIS — Z79899 Other long term (current) drug therapy: Secondary | ICD-10-CM | POA: Diagnosis not present

## 2018-10-02 DIAGNOSIS — R51 Headache: Secondary | ICD-10-CM | POA: Diagnosis not present

## 2018-10-02 LAB — CBC
HCT: 41.9 % (ref 36.0–46.0)
Hemoglobin: 13 g/dL (ref 12.0–15.0)
MCH: 27 pg (ref 26.0–34.0)
MCHC: 31 g/dL (ref 30.0–36.0)
MCV: 87.1 fL (ref 80.0–100.0)
NRBC: 0 % (ref 0.0–0.2)
PLATELETS: 353 10*3/uL (ref 150–400)
RBC: 4.81 MIL/uL (ref 3.87–5.11)
RDW: 14.4 % (ref 11.5–15.5)
WBC: 6.5 10*3/uL (ref 4.0–10.5)

## 2018-10-02 LAB — BASIC METABOLIC PANEL
ANION GAP: 6 (ref 5–15)
BUN: 11 mg/dL (ref 6–20)
CALCIUM: 9 mg/dL (ref 8.9–10.3)
CHLORIDE: 104 mmol/L (ref 98–111)
CO2: 28 mmol/L (ref 22–32)
Creatinine, Ser: 0.78 mg/dL (ref 0.44–1.00)
GFR calc non Af Amer: >60 mL/min (ref >60)
Glucose, Bld: 98 mg/dL (ref 70–99)
POTASSIUM: 3.9 mmol/L (ref 3.5–5.1)
Sodium: 138 mmol/L (ref 135–145)

## 2018-10-02 LAB — TROPONIN I: Troponin I: <0.03 ng/mL (ref <0.03)

## 2018-10-02 MED ORDER — KETOROLAC TROMETHAMINE 30 MG/ML IJ SOLN
30.0000 mg | Freq: Once | INTRAMUSCULAR | Status: AC
  Administered 2018-10-02: 30 mg via INTRAVENOUS
  Filled 2018-10-02: qty 1

## 2018-10-02 MED ORDER — PROCHLORPERAZINE EDISYLATE 10 MG/2ML IJ SOLN
10.0000 mg | Freq: Once | INTRAMUSCULAR | Status: AC
  Administered 2018-10-02: 10 mg via INTRAVENOUS
  Filled 2018-10-02: qty 2

## 2018-10-02 MED ORDER — BUTALBITAL-APAP-CAFFEINE 50-325-40 MG PO TABS: 1.0000 | ORAL_TABLET | Freq: Four times a day (QID) | ORAL | 0 refills | Status: AC | PRN

## 2018-10-02 MED ORDER — DIPHENHYDRAMINE HCL 50 MG/ML IJ SOLN
25.0000 mg | Freq: Once | INTRAMUSCULAR | Status: AC
  Administered 2018-10-02: 25 mg via INTRAVENOUS
  Filled 2018-10-02: qty 1

## 2018-10-02 MED ORDER — SODIUM CHLORIDE 0.9 % IV BOLUS
1000.0000 mL | Freq: Once | INTRAVENOUS | Status: AC
  Administered 2018-10-02: 1000 mL via INTRAVENOUS

## 2018-10-02 NOTE — ED Provider Notes (Addendum)
[facility] Emergency Department Provider Note  ___________________________________________   First MD Initiated Contact with Patient 10/02/18 1925     (approximate)  I have reviewed the triage vital signs and the nursing notes.   HISTORY  Chief Complaint Migraine and Chest Pain   HPI Carol Ortega is a 47 y.o. female the emergency department today with left-sided chest pain as well as left-sided pressure to her neck and head that she feels is extending from the site of the tetanus shot injection which she had this past Thursday.  Patient says that the pain is a 6 to an 8 out of 10.  Says that she also feels like her eye has pressure behind it.  Says that she is sensitive to bright lights and is also having nausea.  Does not report shortness of breath.  Says she feels like the chest pain is left-sided and is a soreness.   Past Medical History:  Diagnosis Date  . Anemia   . Anxiety   . Asthma    seasonal  . Benign brain tumor ([place])   . Brain tumor ([place]) 2014   tx with radiation  . Cancer ([place])   . Complication of anesthesia 1999   lung collapse after surgery for gallbladder  . Depression   . Ectopic pregnancy   . Headache    migraines  . Hoarseness of voice   . Hypertension   . Kidney stone   . Obesity   . Parathyroid abnormality ([place])   . Pulmonary emboli ([place]) 2011  . Pulmonary embolism ([place])   . Radiation    Brain tumor  . UTI (urinary tract infection)   . Vitamin D deficiency     Patient Active Problem List   Diagnosis Date Noted  . Insomnia 07/30/2018  . Chronic pain of both knees 07/30/2018  . Morbid obesity due to excess calories ([place]) 05/09/2018  . Depression, recurrent ([place]) 05/09/2018  . GAD (generalized anxiety disorder) 05/09/2018  . DVT (deep venous thrombosis) ([place]) 04/15/2018  . Bladder spasms 03/12/2018  . Ureteral stone with hydronephrosis 02/28/2018  . Kidney stone 04/09/2017  . H/O pulmonary embolus during  pregnancy 08/28/2016  . S/P laparoscopic cholecystectomy 08/28/2016  . Hyperparathyroidism ([place]) 09/10/2015  . Vitamin D deficiency 06/25/2014  . Hypercalcemia 02/09/2014  . Essential hypertension 01/30/2014  . Hyperlipidemia 01/30/2014  . 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 Adult body mass index 50.0-59.9 ([place]) 03/25/2013  . Anxiety state 03/25/2013  . Asthma 03/25/2013  . Obstructive sleep apnea 03/25/2013  . Personal history of venous thrombosis and embolism 03/25/2013  . 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 Brain tumor ([place]) 12/18/2012  . 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 CHOLECYSTECTOMY  1999  . CYSTOSCOPY W/ RETROGRADES Left 03/01/2018   Procedure: CYSTOSCOPY WITH RETROGRADE PYELOGRAM;  Surgeon: Abbie Sons, MD;  Location: [facility];  Service: Urology;  Laterality: Left;  . CYSTOSCOPY W/ URETERAL STENT PLACEMENT Right 04/08/2017   Procedure: CYSTOSCOPY WITH RETROGRADE PYELOGRAM/URETERAL STENT PLACEMENT;  Surgeon: Rana Snare, MD;  Location: [facility];  Service: Urology;  Laterality: Right;  . CYSTOSCOPY W/ URETERAL STENT PLACEMENT Left 03/12/2018   Procedure: CYSTOSCOPY WITH STENT REPLACEMENT;  Surgeon: Abbie Sons, MD;  Location: [facility];  Service: Urology;  Laterality: Left;  . CYSTOSCOPY W/ URETEROSCOPY    . CYSTOSCOPY WITH RETROGRADE PYELOGRAM, URETEROSCOPY AND STENT PLACEMENT Left 01/20/2014   Procedure: LEFT URETEROSCOPY WITH STENT PLACEMENT;  Surgeon: Irine Seal, MD;  Location: [facility];  Service: Urology;  Laterality: Left;  . CYSTOSCOPY WITH RETROGRADE PYELOGRAM, URETEROSCOPY AND STENT PLACEMENT Bilateral 04/23/2015   Procedure: CYSTOSCOPY WITH BILATERAL RETROGRADE PYELOGRAM, URETEROSCOPY AND STENT PLACEMENT;  Surgeon: Alexis Frock, MD;  Location: [facility];  Service: Urology;  Laterality: Bilateral;  . CYSTOSCOPY WITH RETROGRADE PYELOGRAM, URETEROSCOPY AND STENT PLACEMENT Right 04/13/2017   Procedure:  CYSTOSCOPY WITH RETROGRADE PYELOGRAM, URETEROSCOPY AND STENT EXCHANGE;  Surgeon: Alexis Frock, MD;  Location: [facility];  Service: Urology;  Laterality: Right;  . CYSTOSCOPY WITH STENT PLACEMENT Left 01/12/2014   Procedure: CYSTOSCOPY WITH STENT PLACEMENT left retrograde;  Surgeon: Irine Seal, MD;  Location: [facility];  Service: Urology;  Laterality: Left;  . CYSTOSCOPY WITH STENT PLACEMENT Left 03/01/2018   Procedure: CYSTOSCOPY WITH STENT PLACEMENT;  Surgeon: Abbie Sons, MD;  Location: [facility];  Service: Urology;  Laterality: Left;  . CYSTOSCOPY/URETEROSCOPY/HOLMIUM LASER/STENT PLACEMENT Left 03/12/2018   Procedure: CYSTOSCOPY/URETEROSCOPY/HOLMIUM LASER;  Surgeon: Abbie Sons, MD;  Location: [facility];  Service: Urology;  Laterality: Left;  . HOLMIUM LASER APPLICATION Left 06/22/8114   Procedure: HOLMIUM LASER APPLICATION;  Surgeon: Irine Seal, MD;  Location: [facility];  Service: Urology;  Laterality: Left;  . HOLMIUM LASER APPLICATION Bilateral 06/18/6202   Procedure: HOLMIUM LASER APPLICATION;  Surgeon: Alexis Frock, MD;  Location: [facility];  Service: Urology;  Laterality: Bilateral;  . HOLMIUM LASER APPLICATION Right 5/59/7416   Procedure: HOLMIUM LASER APPLICATION;  Surgeon: Alexis Frock, MD;  Location: [facility];  Service: Urology;  Laterality: Right;  . kidney stone removal    . LITHOTRIPSY    . PARATHYROIDECTOMY Right 11/13/2016   Procedure: RIGHT SUPERIOR PARATHYROIDECTOMY;  Surgeon: Armandina Gemma, MD;  Location: [facility];  Service: General;  Laterality: Right;  . surgery for,ectopic pregnancy  2011    1 fallopian tube rupture  . TUBAL LIGATION  2011    Prior to Admission medications   Medication Sig Start Date End Date Taking? Authorizing Provider  acetaminophen (TYLENOL) 500 MG tablet Take 500 mg by mouth every 6 (six) hours as needed.    [provider]  albuterol (PROVENTIL HFA;VENTOLIN HFA) 108 (90 Base) MCG/ACT inhaler Inhale 2 puffs into the lungs every 6 (six) hours as  needed for wheezing or shortness of breath. 03/04/18   Loletha Grayer, MD  albuterol (PROVENTIL) (2.5 MG/3ML) 0.083% nebulizer solution Take 3 mLs (2.5 mg total) by nebulization every 6 (six) hours as needed for wheezing or shortness of breath. 03/04/18   Loletha Grayer, MD  ARIPiprazole (ABILIFY) 10 MG tablet Take 1 tablet (10 mg total) by mouth daily. For mood 09/23/18   Ursula Alert, MD  butalbital-acetaminophen-caffeine (FIORICET, ESGIC) 50-325-40 MG tablet Take 1-2 tablets by mouth every 6 (six) hours as needed for headache. 10/02/18 10/02/19  Schaevitz, Randall An, MD  diclofenac sodium (VOLTAREN) 1 % GEL Apply 4 g topically 4 (four) times daily. 07/30/18   Johnson, Megan P, DO  diphenhydrAMINE (BENADRYL) 25 MG tablet Take 25 mg by mouth daily as needed for allergies.    [provider]  omeprazole (PRILOSEC) 20 MG capsule Take 1 capsule (20 mg total) by mouth daily. 09/26/18   Johnson, Megan P, DO  propranolol (INDERAL) 10 MG tablet Take 1 tablet (10 mg total) by mouth 3 (three) times daily as needed. For severe anxiety symptoms 09/11/18   Ursula Alert, MD  traZODone (DESYREL) 150 MG tablet Take 1 tablet (150 mg total) by mouth at bedtime. 09/23/18  Ursula Alert, MD  Vitamin D, Ergocalciferol, (DRISDOL) 50000 units CAPS capsule Take 1 capsule (50,000 Units total) by mouth every 7 (seven) days. 07/31/18   Park Liter P, DO    Allergies Bee venom; Contrast media [iodinated diagnostic agents]; Eggs or egg-derived products; Other; Penicillins; Wellbutrin [bupropion]; Latex; and Oxycodone-acetaminophen  Family History  Problem Relation Age of Onset  . Bell's palsy Mother   . Heart failure Mother   . Hypertension Mother   . Lupus Mother   . Anxiety disorder Mother   . Depression Mother   . Stroke Mother   . Asthma Father   . Hypertension Father   . Hyperlipidemia Father   . Anxiety disorder Father   . Depression Father   . Hypertension Maternal Grandmother   .  Diabetes Maternal Grandmother   . Sarcoidosis Maternal Grandmother   . Anxiety disorder Sister   . Depression Sister   . Bipolar disorder Sister   . Depression Son   . Bipolar disorder Son     Social History Social History   Tobacco Use  . Smoking status: Never Smoker  . Smokeless tobacco: Never Used  Substance Use Topics  . Alcohol use: Not Currently    Comment: soical  . Drug use: No    Review of Systems  Constitutional: No fever/chills Eyes: As above  ENT: No sore throat. Cardiovascular: As above Respiratory: Denies shortness of breath. Gastrointestinal: No abdominal pain.  No nausea, no vomiting.  No diarrhea.  No constipation. Genitourinary: Negative for dysuria. Musculoskeletal: Negative for back pain. Skin: Negative for rash. Neurological: Negative for focal weakness or numbness.   ____________________________________________   PHYSICAL EXAM:  VITAL SIGNS: ED Triage Vitals  Enc Vitals Group     BP 10/02/18 1810 (!) 136/100     Pulse Rate 10/02/18 1810 78     Resp 10/02/18 1810 20     Temp 10/02/18 1810 98.2 F (36.8 C)     Temp Source 10/02/18 1810 Oral     SpO2 10/02/18 1810 99 %     Weight 10/02/18 1811 (!) 335 lb 4 oz (152.1 kg)     Height 10/02/18 1811 5' 5.5" (1.664 m)     Head Circumference --      Peak Flow --      Pain Score 10/02/18 1811 10     Pain Loc --      Pain Edu? --      Excl. in [place]? --     Constitutional: Alert and oriented. Well appearing and in no acute distress. Eyes: Conjunctivae are normal.  EOMI.  PERRL.   Head: Atraumatic. Nose: No congestion/rhinnorhea. Mouth/Throat: Mucous membranes are moist.  Neck: No stridor.  Tender over the left trapezius muscle. Cardiovascular: Normal rate, regular rhythm. Grossly normal heart sounds.  Tenderness to palpation over the left anterior chest wall. Respiratory: Normal respiratory effort.  No retractions. Lungs CTAB. Gastrointestinal: Soft and nontender. No distention.    Musculoskeletal: No lower extremity tenderness nor edema.  No joint effusions. Neurologic:  Normal speech and language. No gross focal neurologic deficits are appreciated. Skin:  Skin is warm, dry and intact. No rash noted. Psychiatric: Mood and affect are normal. Speech and behavior are normal.  ____________________________________________   LABS (all labs ordered are listed, but only abnormal results are displayed)  Labs Reviewed  BASIC METABOLIC PANEL  CBC  TROPONIN I  POC URINE PREG, ED   ____________________________________________  EKG  ED ECG REPORT I, Doran Stabler, the attending  physician, personally viewed and interpreted this ECG.   Date: 10/02/2018  EKG Time: 1738  Rate: 73  Rhythm: normal sinus rhythm  Axis: Normal  Intervals:none  ST&T Change: No ST segment elevation or depression.  No abnormal T wave inversion.  ____________________________________________  RADIOLOGY  X-ray without active cardiopulmonary disease. ____________________________________________   PROCEDURES  Procedure(s) performed:   Procedures  Critical Care performed:   ____________________________________________   INITIAL IMPRESSION / ASSESSMENT AND PLAN / ED COURSE  Pertinent labs & imaging results that were available during my care of the patient were reviewed by me and considered in my medical decision making (see chart for details).  Differential diagnosis includes, but is not limited to, ACS, aortic dissection, pulmonary embolism, cardiac tamponade, pneumothorax, pneumonia, pericarditis, myocarditis, GI-related causes including esophagitis/gastritis, and musculoskeletal chest wall pain.   Differential diagnosis includes, but is not limited to, intracranial hemorrhage, meningitis/encephalitis, previous head trauma, cavernous venous thrombosis, tension headache, temporal arteritis, migraine or migraine equivalent, idiopathic intracranial hypertension, and non-specific  headache. As part of my medical decision making, I reviewed the following data within the electronic MEDICAL RECORD NUMBER Notes from prior ED visits  ----------------------------------------- 9:49 PM on 10/02/2018 -----------------------------------------  Patient at this time says that the symptoms are abating.  About halfway on her fluids.  Patient will finish her IV fluids.  Will be discharged with Fioricet.  Unlikely to be secondary cephalgia.  Likely tension headache.  Chest pain likely related to tension and muscle soreness.  Patient understanding the diagnosis as well as treatment plan and willing to comply.  Will return for any worsening or concerning symptoms. ____________________________________________   FINAL CLINICAL IMPRESSION(S) / ED DIAGNOSES  Headache.  Chest pain.  NEW MEDICATIONS STARTED DURING THIS VISIT:  New Prescriptions   BUTALBITAL-ACETAMINOPHEN-CAFFEINE (FIORICET, ESGIC) 50-325-40 MG TABLET    Take 1-2 tablets by mouth every 6 (six) hours as needed for headache.     Note:  This document was prepared using Dragon voice recognition software and may include unintentional dictation errors.     Orbie Pyo, MD 10/02/18 2150    Orbie Pyo, MD 10/02/18 2152

## 2018-10-02 NOTE — ED Triage Notes (Signed)
Pt reports that last week she got a tetanus shot and after that she has had a migraine since. She reports that it goes down her neck, she also is having left sided chest pain. Denies any N/V/D.

## 2018-10-02 NOTE — ED Notes (Signed)
Pt ambulatory to POV without difficulty. VSS. NAD. Discharge instructions, RX and follow up reviewed. All questions and concerns addressed.  

## 2018-10-07 ENCOUNTER — Ambulatory Visit: Payer: Medicaid Other | Admitting: Licensed Clinical Social Worker

## 2018-10-07 ENCOUNTER — Ambulatory Visit: Payer: Medicaid Other | Admitting: Psychiatry

## 2018-10-08 ENCOUNTER — Ambulatory Visit: Payer: Medicaid Other | Admitting: Family Medicine

## 2018-10-23 ENCOUNTER — Encounter: Payer: Self-pay | Admitting: Psychiatry

## 2018-10-23 ENCOUNTER — Ambulatory Visit: Payer: Medicaid Other | Admitting: Psychiatry

## 2018-10-23 ENCOUNTER — Ambulatory Visit (INDEPENDENT_AMBULATORY_CARE_PROVIDER_SITE_OTHER): Payer: Medicaid Other | Admitting: Licensed Clinical Social Worker

## 2018-10-23 ENCOUNTER — Encounter: Payer: Self-pay | Admitting: Licensed Clinical Social Worker

## 2018-10-23 VITALS — BP 142/91 | HR 81 | Temp 98.0°F | Wt 347.6 lb

## 2018-10-23 DIAGNOSIS — F4310 Post-traumatic stress disorder, unspecified: Secondary | ICD-10-CM

## 2018-10-23 DIAGNOSIS — F3161 Bipolar disorder, current episode mixed, mild: Secondary | ICD-10-CM | POA: Diagnosis not present

## 2018-10-23 MED ORDER — HYDROXYZINE PAMOATE 25 MG PO CAPS: 25.0000 mg | ORAL_CAPSULE | Freq: Two times a day (BID) | ORAL | 1 refills | Status: DC | PRN

## 2018-10-23 MED ORDER — CITALOPRAM HYDROBROMIDE 10 MG PO TABS: 10.0000 mg | ORAL_TABLET | Freq: Every day | ORAL | 1 refills | Status: DC

## 2018-10-23 NOTE — Patient Instructions (Signed)
Hydroxyzine capsules or tablets What is this medicine? HYDROXYZINE (hye [place] i zeen) is an antihistamine. This medicine is used to treat allergy symptoms. It is also used to treat anxiety and tension. This medicine can be used with other medicines to induce sleep before surgery. This medicine may be used for other purposes; ask your health care provider or pharmacist if you have questions. COMMON BRAND NAME(S): ANX, Atarax, Rezine, Vistaril What should I tell my health care provider before I take this medicine? They need to know if you have any of these conditions: -any chronic illness -difficulty passing urine -glaucoma -heart disease -kidney disease -liver disease -lung disease -an unusual or allergic reaction to hydroxyzine, cetirizine, other medicines, foods, dyes, or preservatives -pregnant or trying to get pregnant -breast-feeding How should I use this medicine? Take this medicine by mouth with a full glass of water. Follow the directions on the prescription label. You may take this medicine with food or on an empty stomach. Take your medicine at regular intervals. Do not take your medicine more often than directed. Talk to your pediatrician regarding the use of this medicine in children. Special care may be needed. While this drug may be prescribed for children as young as 75 years of age for selected conditions, precautions do apply. Patients over 62 years old may have a stronger reaction and need a smaller dose. Overdosage: If you think you have taken too much of this medicine contact a poison control center or emergency room at once. NOTE: This medicine is only for you. Do not share this medicine with others. What if I miss a dose? If you miss a dose, take it as soon as you can. If it is almost time for your next dose, take only that dose. Do not take double or extra doses. What may interact with this medicine? -alcohol -barbiturate medicines for sleep or seizures -medicines for  colds, allergies -medicines for depression, anxiety, or emotional disturbances -medicines for pain -medicines for sleep -muscle relaxants This list may not describe all possible interactions. Give your health care provider a list of all the medicines, herbs, non-prescription drugs, or dietary supplements you use. Also tell them if you smoke, drink alcohol, or use illegal drugs. Some items may interact with your medicine. What should I watch for while using this medicine? Tell your doctor or health care professional if your symptoms do not improve. You may get drowsy or dizzy. Do not drive, use machinery, or do anything that needs mental alertness until you know how this medicine affects you. Do not stand or sit up quickly, especially if you are an older patient. This reduces the risk of dizzy or fainting spells. Alcohol may interfere with the effect of this medicine. Avoid alcoholic drinks. Your mouth may get dry. Chewing sugarless gum or sucking hard candy, and drinking plenty of water may help. Contact your doctor if the problem does not go away or is severe. This medicine may cause dry eyes and blurred vision. If you wear contact lenses you may feel some discomfort. Lubricating drops may help. See your eye doctor if the problem does not go away or is severe. If you are receiving skin tests for allergies, tell your doctor you are using this medicine. What side effects may I notice from receiving this medicine? Side effects that you should report to your doctor or health care professional as soon as possible: -fast or irregular heartbeat -difficulty passing urine -seizures -slurred speech or confusion -tremor Side effects that  usually do not require medical attention (report to your doctor or health care professional if they continue or are bothersome): -constipation -drowsiness -fatigue -headache -stomach upset This list may not describe all possible side effects. Call your doctor for  medical advice about side effects. You may report side effects to FDA at 1-800-FDA-1088. Where should I keep my medicine? Keep out of the reach of children. Store at room temperature between 15 and 30 degrees C (59 and 86 degrees F). Keep container tightly closed. Throw away any unused medicine after the expiration date. NOTE: This sheet is a summary. It may not cover all possible information. If you have questions about this medicine, talk to your doctor, pharmacist, or health care provider.  2018 Elsevier/Gold Standard (2008-04-17 14:50:59) Citalopram tablets What is this medicine? CITALOPRAM (sye TAL oh pram) is a medicine for depression. This medicine may be used for other purposes; ask your health care provider or pharmacist if you have questions. COMMON BRAND NAME(S): Celexa What should I tell my health care provider before I take this medicine? They need to know if you have any of these conditions: -bleeding disorders -bipolar disorder or a family history of bipolar disorder -glaucoma -heart disease -history of irregular heartbeat -kidney disease -liver disease -low levels of magnesium or potassium in the blood -receiving electroconvulsive therapy -seizures -suicidal thoughts, plans, or attempt; a previous suicide attempt by you or a family member -take medicines that treat or prevent blood clots -thyroid disease -an unusual or allergic reaction to citalopram, escitalopram, other medicines, foods, dyes, or preservatives -pregnant or trying to become pregnant -breast-feeding How should I use this medicine? Take this medicine by mouth with a glass of water. Follow the directions on the prescription label. You can take it with or without food. Take your medicine at regular intervals. Do not take your medicine more often than directed. Do not stop taking this medicine suddenly except upon the advice of your doctor. Stopping this medicine too quickly may cause serious side effects or  your condition may worsen. A special MedGuide will be given to you by the pharmacist with each prescription and refill. Be sure to read this information carefully each time. Talk to your pediatrician regarding the use of this medicine in children. Special care may be needed. Patients over 63 years old may have a stronger reaction and need a smaller dose. Overdosage: If you think you have taken too much of this medicine contact a poison control center or emergency room at once. NOTE: This medicine is only for you. Do not share this medicine with others. What if I miss a dose? If you miss a dose, take it as soon as you can. If it is almost time for your next dose, take only that dose. Do not take double or extra doses. What may interact with this medicine? Do not take this medicine with any of the following medications: -certain medicines for fungal infections like fluconazole, itraconazole, ketoconazole, posaconazole, voriconazole -cisapride -dofetilide -dronedarone -escitalopram -linezolid -MAOIs like Carbex, Eldepryl, Marplan, Nardil, and Parnate -methylene blue (injected into a vein) -pimozide -thioridazine -ziprasidone This medicine may also interact with the following medications: -alcohol -amphetamines -aspirin and aspirin-like medicines -carbamazepine -certain medicines for depression, anxiety, or psychotic disturbances -certain medicines for infections like chloroquine, clarithromycin, erythromycin, furazolidone, isoniazid, pentamidine -certain medicines for migraine headaches like almotriptan, eletriptan, frovatriptan, naratriptan, rizatriptan, sumatriptan, zolmitriptan -certain medicines for sleep -certain medicines that treat or prevent blood clots like dalteparin, enoxaparin, warfarin -cimetidine -diuretics -fentanyl -lithium -methadone -  metoprolol -NSAIDs, medicines for pain and inflammation, like ibuprofen or naproxen -omeprazole -other medicines that prolong the  QT interval (cause an abnormal heart rhythm) -procarbazine -rasagiline -supplements like St. John's wort, kava kava, valerian -tramadol -tryptophan This list may not describe all possible interactions. Give your health care provider a list of all the medicines, herbs, non-prescription drugs, or dietary supplements you use. Also tell them if you smoke, drink alcohol, or use illegal drugs. Some items may interact with your medicine. What should I watch for while using this medicine? Tell your doctor if your symptoms do not get better or if they get worse. Visit your doctor or health care professional for regular checks on your progress. Because it may take several weeks to see the full effects of this medicine, it is important to continue your treatment as prescribed by your doctor. Patients and their families should watch out for new or worsening thoughts of suicide or depression. Also watch out for sudden changes in feelings such as feeling anxious, agitated, panicky, irritable, hostile, aggressive, impulsive, severely restless, overly excited and hyperactive, or not being able to sleep. If this happens, especially at the beginning of treatment or after a change in dose, call your health care professional. Dennis Bast may get drowsy or dizzy. Do not drive, use machinery, or do anything that needs mental alertness until you know how this medicine affects you. Do not stand or sit up quickly, especially if you are an older patient. This reduces the risk of dizzy or fainting spells. Alcohol may interfere with the effect of this medicine. Avoid alcoholic drinks. Your mouth may get dry. Chewing sugarless gum or sucking hard candy, and drinking plenty of water will help. Contact your doctor if the problem does not go away or is severe. What side effects may I notice from receiving this medicine? Side effects that you should report to your doctor or health care professional as soon as possible: -allergic reactions  like skin rash, itching or hives, swelling of the face, lips, or tongue -anxious -black, tarry stools -breathing problems -changes in vision -chest pain -confusion -elevated mood, decreased need for sleep, racing thoughts, impulsive behavior -eye pain -fast, irregular heartbeat -feeling faint or lightheaded, falls -feeling agitated, angry, or irritable -hallucination, loss of contact with reality -loss of balance or coordination -loss of memory -painful or prolonged erections -restlessness, pacing, inability to keep still -seizures -stiff muscles -suicidal thoughts or other mood changes -trouble sleeping -unusual bleeding or bruising -unusually weak or tired -vomiting Side effects that usually do not require medical attention (report to your doctor or health care professional if they continue or are bothersome): -change in appetite or weight -change in sex drive or performance -dizziness -headache -increased sweating -indigestion, nausea -tremors This list may not describe all possible side effects. Call your doctor for medical advice about side effects. You may report side effects to FDA at 1-800-FDA-1088. Where should I keep my medicine? Keep out of reach of children. Store at room temperature between 15 and 30 degrees C (59 and 86 degrees F). Throw away any unused medicine after the expiration date. NOTE: This sheet is a summary. It may not cover all possible information. If you have questions about this medicine, talk to your doctor, pharmacist, or health care provider.  2018 Elsevier/Gold Standard (2016-05-08 13:18:52)
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
## 2018-10-23 NOTE — Progress Notes (Signed)
[facility] MD OP Progress Note  10/23/2018 10:36 AM Carol Ortega  MRN:  [id number]  Chief Complaint: ' I am here for follow up.' Chief Complaint    Follow-up; Medication Refill; Anxiety; Depression; Panic Attack; Fatigue     HPI: Carol Ortega is a 47 year old African-American female, divorced, lives in [place], has a history of PTSD, bipolar disorder, insomnia, history of hyperparathyroidism-status parathyroidectomy, history of renal stones, history of hypercalcemia, asthma, history of brain tumor, presented to the clinic today for a follow-up visit.  Patient today reports she has noticed improvement with her sleep.  She is happy with the increased dosage of trazodone.  She however reports she continues to have anxiety symptoms.  She does not think the propranolol as needed as helpful.  She also does not know if the Abilify is working or not.  She reports she continues to struggle with some mood lability.  Patient had genomind testing done.  Testing results were reviewed with patient.  Discussed adding Celexa for her anxiety symptoms as well as her mood in general.  Discussed with patient to give Abilify more time.  Patient agrees with plan.  Patient to continue psychotherapy sessions.  Patient denies any other concerns today.   Visit Diagnosis:    ICD-10-CM   1. Bipolar 1 disorder, mixed, mild (HCC) F31.61   2. PTSD (post-traumatic stress disorder) F43.10     Past Psychiatric History: Have reviewed past psychiatric history from my progress note on 09/11/2018.  Past trials of Cymbalta, Lexapro, Effexor, Seroquel, Wellbutrin, Ambien, Klonopin  Past Medical History:  Past Medical History:  Diagnosis Date  . Anemia   . Anxiety   . Asthma    seasonal  . Benign brain tumor ([place])   . Brain tumor ([place]) 2014   tx with radiation  . Cancer ([place])   . Complication of anesthesia 1999   lung collapse after surgery for gallbladder  . Depression   . Ectopic pregnancy   . Headache    migraines  . Hoarseness of voice   . Hypertension   . Kidney stone   . Obesity   . Parathyroid abnormality ([place])   . Pulmonary emboli ([place]) 2011  . Pulmonary embolism ([place])   . Radiation    Brain tumor  . UTI (urinary tract infection)   . Vitamin D deficiency     Past Surgical History:  Procedure Laterality Date  . CHOLECYSTECTOMY  1999  . CYSTOSCOPY W/ RETROGRADES Left 03/01/2018   Procedure: CYSTOSCOPY WITH RETROGRADE PYELOGRAM;  Surgeon: Abbie Sons, MD;  Location: [facility];  Service: Urology;  Laterality: Left;  . CYSTOSCOPY W/ URETERAL STENT PLACEMENT Right 04/08/2017   Procedure: CYSTOSCOPY WITH RETROGRADE PYELOGRAM/URETERAL STENT PLACEMENT;  Surgeon: Rana Snare, MD;  Location: [facility];  Service: Urology;  Laterality: Right;  . CYSTOSCOPY W/ URETERAL STENT PLACEMENT Left 03/12/2018   Procedure: CYSTOSCOPY WITH STENT REPLACEMENT;  Surgeon: Abbie Sons, MD;  Location: [facility];  Service: Urology;  Laterality: Left;  . CYSTOSCOPY W/ URETEROSCOPY    . CYSTOSCOPY WITH RETROGRADE PYELOGRAM, URETEROSCOPY AND STENT PLACEMENT Left 01/20/2014   Procedure: LEFT URETEROSCOPY WITH STENT PLACEMENT;  Surgeon: Irine Seal, MD;  Location: [facility];  Service: Urology;  Laterality: Left;  . CYSTOSCOPY WITH RETROGRADE PYELOGRAM, URETEROSCOPY AND STENT PLACEMENT Bilateral 04/23/2015   Procedure: CYSTOSCOPY WITH BILATERAL RETROGRADE PYELOGRAM, URETEROSCOPY AND STENT PLACEMENT;  Surgeon: Alexis Frock, MD;  Location: [facility];  Service: Urology;  Laterality: Bilateral;  . CYSTOSCOPY WITH RETROGRADE PYELOGRAM, URETEROSCOPY AND  STENT PLACEMENT Right 04/13/2017   Procedure: CYSTOSCOPY WITH RETROGRADE PYELOGRAM, URETEROSCOPY AND STENT EXCHANGE;  Surgeon: Alexis Frock, MD;  Location[facility];  Service: Urology;  Laterality: Right;  . CYSTOSCOPY WITH STENT PLACEMENT Left 01/12/2014   Procedure: CYSTOSCOPY WITH STENT PLACEMENT left retrograde;  Surgeon: Irine Seal, MD;  Location[facility];  Service: Urology;   Laterality: Left;  . CYSTOSCOPY WITH STENT PLACEMENT Left 03/01/2018   Procedure: CYSTOSCOPY WITH STENT PLACEMENT;  Surgeon: Abbie Sons, MD;  Location: [facility];  Service: Urology;  Laterality: Left;  . CYSTOSCOPY/URETEROSCOPY/HOLMIUM LASER/STENT PLACEMENT Left 03/12/2018   Procedure: CYSTOSCOPY/URETEROSCOPY/HOLMIUM LASER;  Surgeon: Abbie Sons, MD;  Location: [facility];  Service: Urology;  Laterality: Left;  . HOLMIUM LASER APPLICATION Left 03/19/3243   Procedure: HOLMIUM LASER APPLICATION;  Surgeon: Irine Seal, MD;  [facility];  Service: Urology;  Laterality: Left;  . HOLMIUM LASER APPLICATION Bilateral 0/12/270   Procedure: HOLMIUM LASER APPLICATION;  Surgeon: Alexis Frock, MD;  Location[facility];  Service: Urology;  Laterality: Bilateral;  . HOLMIUM LASER APPLICATION Right 5/36/6440   Procedure: HOLMIUM LASER APPLICATION;  Surgeon: Alexis Frock, MD;  Location: [facility];  Service: Urology;  Laterality: Right;  . kidney stone removal    . LITHOTRIPSY    . PARATHYROIDECTOMY Right 11/13/2016   Procedure: RIGHT SUPERIOR PARATHYROIDECTOMY;  Surgeon: Armandina Gemma, MD;  Location: [facility];  Service: General;  Laterality: Right;  . surgery for,ectopic pregnancy  2011    1 fallopian tube rupture  . TUBAL LIGATION  2011    Family Psychiatric History: Have reviewed family psychiatric history from my progress note on 09/11/2018  Family History:  Family History  Problem Relation Age of Onset  . Bell's palsy Mother   . Heart failure Mother   . Hypertension Mother   . Lupus Mother   . Anxiety disorder Mother   . Depression Mother   . Stroke Mother   . Asthma Father   . Hypertension Father   . Hyperlipidemia Father   . Anxiety disorder Father   . Depression Father   . Hypertension Maternal Grandmother   . Diabetes Maternal Grandmother   . Sarcoidosis Maternal Grandmother   . Anxiety disorder Sister   . Depression Sister   . Bipolar disorder Sister   . Depression Son   . Bipolar  disorder Son     Social History: Have reviewed social history from my progress note on 09/11/2018 Social History   Socioeconomic History  . Marital status: Divorced    Spouse name: Not on file  . Number of children: 4  . Years of education: Not on file  . Highest education level: Some college, no degree  Occupational History  . Not on file  Social Needs  . Financial resource strain: Somewhat hard  . Food insecurity:    Worry: Often true    Inability: Often true  . Transportation needs:    Medical: Yes    Non-medical: Yes  Tobacco Use  . Smoking status: Never Smoker  . Smokeless tobacco: Never Used  Substance and Sexual Activity  . Alcohol use: Not Currently    Comment: soical  . Drug use: No  . Sexual activity: Not Currently  Lifestyle  . Physical activity:    Days per week: 0 days    Minutes per session: 0 min  . Stress: Very much  Relationships  . Social connections:    Talks on phone: More than three times a week    Gets  together: More than three times a week    Attends religious service: Never    Active member of club or organization: No    Attends meetings of clubs or organizations: Never    Relationship status: Divorced  Other Topics Concern  . Not on file  Social History Narrative   Lives at home with her son, independent at baselinepend    Allergies:  Allergies  Allergen Reactions  . Bee Venom Anaphylaxis  . Contrast Media [Iodinated Diagnostic Agents] Anaphylaxis  . Eggs Or Egg-Derived Products Anaphylaxis  . Other Anaphylaxis    Surgical dye.. Pt states ok to take if previously taken benadryl or prednisone  . Penicillins Hives and Other (See Comments)    Has patient had a PCN reaction causing immediate rash, facial/tongue/throat swelling, SOB or lightheadedness with hypotension: No Has patient had a PCN reaction causing severe rash involving mucus membranes or skin necrosis: No Has patient had a PCN reaction that required hospitalization No Has  patient had a PCN reaction occurring within the last 10 years: No If all of the above answers are "NO", then may proceed with Cephalosporin use.  . Wellbutrin [Bupropion] Other (See Comments)    irritiability  . Latex Rash  . Oxycodone-Acetaminophen Itching    Metabolic Disorder Labs: No results found for: HGBA1C, MPG No results found for: PROLACTIN Lab Results  Component Value Date   CHOL 257 (H) 09/26/2018   TRIG 96 09/26/2018   HDL 50 09/26/2018   LDLCALC 188 (H) 09/26/2018   LDLCALC 172 (H) 07/30/2018   Lab Results  Component Value Date   TSH 1.690 09/26/2018   TSH 2.060 07/30/2018    Therapeutic Level Labs: No results found for: LITHIUM No results found for: VALPROATE No components found for:  CBMZ  Current Medications: Current Outpatient Medications  Medication Sig Dispense Refill  . acetaminophen (TYLENOL) 500 MG tablet Take 500 mg by mouth every 6 (six) hours as needed.    Marland Kitchen albuterol (PROVENTIL HFA;VENTOLIN HFA) 108 (90 Base) MCG/ACT inhaler Inhale 2 puffs into the lungs every 6 (six) hours as needed for wheezing or shortness of breath. 1 Inhaler 2  . albuterol (PROVENTIL) (2.5 MG/3ML) 0.083% nebulizer solution Take 3 mLs (2.5 mg total) by nebulization every 6 (six) hours as needed for wheezing or shortness of breath. 75 mL 12  . ARIPiprazole (ABILIFY) 10 MG tablet Take 1 tablet (10 mg total) by mouth daily. For mood 30 tablet 0  . butalbital-acetaminophen-caffeine (FIORICET, ESGIC) 50-325-40 MG tablet Take 1-2 tablets by mouth every 6 (six) hours as needed for headache. 20 tablet 0  . diclofenac sodium (VOLTAREN) 1 % GEL Apply 4 g topically 4 (four) times daily. 100 g 12  . diphenhydrAMINE (BENADRYL) 25 MG tablet Take 25 mg by mouth daily as needed for allergies.    Marland Kitchen omeprazole (PRILOSEC) 20 MG capsule Take 1 capsule (20 mg total) by mouth daily. 30 capsule 3  . traZODone (DESYREL) 150 MG tablet Take 1 tablet (150 mg total) by mouth at bedtime. 30 tablet 1  .  Vitamin D, Ergocalciferol, (DRISDOL) 50000 units CAPS capsule Take 1 capsule (50,000 Units total) by mouth every 7 (seven) days. 12 capsule 1  . citalopram (CELEXA) 10 MG tablet Take 1 tablet (10 mg total) by mouth daily with breakfast. For anxiety 30 tablet 1  . hydrOXYzine (VISTARIL) 25 MG capsule Take 1 capsule (25 mg total) by mouth 2 (two) times daily as needed (severe anxiety symptoms). 60 capsule 1  Current Facility-Administered Medications  Medication Dose Route Frequency Provider Last Rate Last Dose  . lidocaine (XYLOCAINE) 2 % jelly 1 application  1 application Urethral Once Stoioff, Ronda Fairly, MD         Musculoskeletal: Strength & Muscle Tone: within normal limits Gait & Station: normal Patient leans: N/A  Psychiatric Specialty Exam: Review of Systems  Psychiatric/Behavioral: Positive for depression. The patient is nervous/anxious.   All other systems reviewed and are negative.   Blood pressure (!) 142/91, pulse 81, temperature 98 F (36.7 C), temperature source Oral, weight (!) 347 lb 9.6 oz (157.7 kg), last menstrual period 02/15/2018.Body mass index is 56.96 kg/m.  General Appearance: Casual  Eye Contact:  Fair  Speech:  Clear and Coherent  Volume:  Normal  Mood:  Anxious  Affect:  Congruent  Thought Process:  Goal Directed and Descriptions of Associations: Intact  Orientation:  Full (Time, Place, and Person)  Thought Content: Logical   Suicidal Thoughts:  No  Homicidal Thoughts:  No  Memory:  Immediate;   Fair Recent;   Fair Remote;   Fair  Judgement:  Fair  Insight:  Fair  Psychomotor Activity:  Normal  Concentration:  Concentration: Fair and Attention Span: Fair  Recall:  [employer] of Knowledge: Fair  Language: Fair  Akathisia:  No  Handed:  Right  AIMS (if indicated): 0  Assets:  Communication Skills Desire for Improvement Housing Transportation  ADL's:  Intact  Cognition: WNL  Sleep:  Fair   Screenings: GAD-7     Office Visit from 07/30/2018  in [facility] Office Visit from 05/09/2018 in [facility]  Total GAD-7 Score  21  21    PHQ2-9     Office Visit from 07/30/2018 in [facility] Visit from 05/09/2018 in [facility]  PHQ-2 Total Score  5  6  PHQ-9 Total Score  22  25       Assessment and Plan: Carol Ortega is a 47 year-old African-American female, divorced, has a history of mood lability, PTSD, insomnia, history of hyperparathyroidism, status post parathyroidectomy, history of brain tumor currently in remission, renal stones, hypercalcemia, presented to the clinic today for a follow-up visit.  Patient continues to struggle with anxiety symptoms.  We will continue to make medication changes as noted below.  Plan  Bipolar disorder Abilify 10 mg p.o. Daily. Add Celexa 10 mg po daily.  PTSD Trazodone 150 mg p.o. nightly Discontinue propanolol for lack of efficacy. Add Celexa 10 mg po daily. Start hydroxyzine 25 mg p.o. twice daily as needed for severe anxiety symptoms Referred for CBT  For insomnia Trazodone 150 mg p.o. nightly  Genomind testing results were reviewed with patient.  More than 50 % of the time was spent for psychoeducation and supportive psychotherapy and care coordination. This note was generated in part or whole with voice recognition software. Voice recognition is usually quite accurate but there are transcription errors that can and very often do occur. I apologize for any typographical errors that were not detected and corrected.        Ursula Alert, MD 10/24/2018, 8:31 AM

## 2018-10-24 ENCOUNTER — Encounter: Payer: Self-pay | Admitting: Psychiatry

## 2018-11-06 DIAGNOSIS — Z86011 Personal history of benign neoplasm of the brain: Secondary | ICD-10-CM | POA: Insufficient documentation

## 2018-11-20 ENCOUNTER — Ambulatory Visit: Payer: Medicaid Other | Admitting: Psychiatry

## 2018-11-20 ENCOUNTER — Ambulatory Visit: Payer: Medicaid Other | Admitting: Licensed Clinical Social Worker

## 2018-11-27 ENCOUNTER — Ambulatory Visit (INDEPENDENT_AMBULATORY_CARE_PROVIDER_SITE_OTHER): Payer: Medicaid Other | Admitting: Family Medicine

## 2018-11-27 ENCOUNTER — Encounter: Payer: Self-pay | Admitting: Family Medicine

## 2018-11-27 VITALS — BP 140/86 | HR 74 | Temp 98.2°F | Ht 65.5 in | Wt 350.4 lb

## 2018-11-27 DIAGNOSIS — R079 Chest pain, unspecified: Secondary | ICD-10-CM | POA: Diagnosis not present

## 2018-11-27 MED ORDER — CYCLOBENZAPRINE HCL 10 MG PO TABS: 10.0000 mg | ORAL_TABLET | Freq: Three times a day (TID) | ORAL | 0 refills | Status: DC | PRN

## 2018-11-27 MED ORDER — NAPROXEN 500 MG PO TABS: 500.0000 mg | ORAL_TABLET | Freq: Two times a day (BID) | ORAL | 0 refills | Status: DC

## 2018-11-27 NOTE — Progress Notes (Signed)
BP 140/86 (BP Location: Left Arm, Patient Position: Sitting, Cuff Size: Normal)   Pulse 74   Temp 98.2 F (36.8 C)   Ht 5' 5.5" (1.664 m)   Wt (!) 350 lb 6 oz (158.9 kg)   LMP 02/15/2018 (Approximate)   SpO2 99%   BMI 57.42 kg/m    Subjective:    Patient ID: Carol Ortega, female    DOB: 07/28/1971, 47 y.o.   MRN: [id number]  HPI: Carol Ortega is a 47 y.o. female  Chief Complaint  Patient presents with  . Chest Pain    right upper, pulled her arm the wrong way on Saturday, now having pain constant   CHEST PAIN- was trying to squeeze into a car on Saturday, fell into the car and has been having pain in her R shoulder and upper R chest, into her shoulder and into her neck Time since onset: 5 days Duration:days Onset: sudden Quality: pulling, stabbing pain Severity: severe Location: upper right Radiation: back, neck, shoulder Episode duration: constant Frequency: constant Related to exertion: no Activity when pain started: fall Trauma: yes Anxiety/recent stressors: no Aggravating factors: movement, breathing, swallowing Alleviating factors: nothing Status: stable Treatments attempted: antacids, ibuprofen  Current pain status: chest wall tender Shortness of breath: no Cough: no Nausea: no Diaphoresis: no Heartburn: no Palpitations: no  Relevant past medical, surgical, family and social history reviewed and updated as indicated. Interim medical history since our last visit reviewed. Allergies and medications reviewed and updated.  Review of Systems  Constitutional: Negative.   Respiratory: Negative.   Cardiovascular: Positive for chest pain. Negative for palpitations and leg swelling.  Gastrointestinal: Negative.   Musculoskeletal: Positive for myalgias. Negative for arthralgias, back pain, gait problem, joint swelling, neck pain and neck stiffness.  Skin: Negative.   Neurological: Negative.   Psychiatric/Behavioral: Negative.     Per HPI unless  specifically indicated above     Objective:    BP 140/86 (BP Location: Left Arm, Patient Position: Sitting, Cuff Size: Normal)   Pulse 74   Temp 98.2 F (36.8 C)   Ht 5' 5.5" (1.664 m)   Wt (!) 350 lb 6 oz (158.9 kg)   LMP 02/15/2018 (Approximate)   SpO2 99%   BMI 57.42 kg/m   Wt Readings from Last 3 Encounters:  11/27/18 (!) 350 lb 6 oz (158.9 kg)  10/02/18 (!) 335 lb 4 oz (152.1 kg)  09/26/18 (!) 335 lb 4 oz (152.1 kg)    Physical Exam  Constitutional: She is oriented to person, place, and time. She appears well-developed and well-nourished. No distress.  HENT:  Head: Normocephalic and atraumatic.  Right Ear: Hearing normal.  Left Ear: Hearing normal.  Nose: Nose normal.  Eyes: Conjunctivae and lids are normal. Right eye exhibits no discharge. Left eye exhibits no discharge. No scleral icterus.  Cardiovascular: Normal rate, regular rhythm and normal pulses.  No extrasystoles are present. PMI is not displaced. Exam reveals no gallop, no S3, no S4, no distant heart sounds and no friction rub.  No murmur heard.  No systolic murmur is present. Pulmonary/Chest: Effort normal. No accessory muscle usage or stridor. No tachypnea. No respiratory distress. She has no decreased breath sounds. She has no wheezes. She has no rhonchi. She has no rales.  Abdominal: Soft. Bowel sounds are normal. She exhibits no distension, no ascites and no mass. There is no splenomegaly or hepatomegaly. There is no tenderness. There is no rebound and no guarding.  Musculoskeletal: Normal range of motion.  Tenderness to palpation of R pec muscle reproducing her pain  Neurological: She is alert and oriented to person, place, and time.  Skin: Skin is warm, dry and intact. Capillary refill takes less than 2 seconds. No abrasion, no ecchymosis and no rash noted. She is not diaphoretic. No cyanosis or erythema. No pallor. Nails show no clubbing.  Psychiatric: She has a normal mood and affect. Her speech is normal  and behavior is normal. Judgment and thought content normal. Cognition and memory are normal.    Results for orders placed or performed during the hospital encounter of 30/13/14  Basic metabolic panel  Result Value Ref Range   Sodium 138 135 - 145 mmol/L   Potassium 3.9 3.5 - 5.1 mmol/L   Chloride 104 98 - 111 mmol/L   CO2 28 22 - 32 mmol/L   Glucose, Bld 98 70 - 99 mg/dL   BUN 11 6 - 20 mg/dL   Creatinine, Ser 0.78 0.44 - 1.00 mg/dL   Calcium 9.0 8.9 - 10.3 mg/dL   GFR calc non Af Amer >60 >60 mL/min   GFR calc Af Amer >60 >60 mL/min   Anion gap 6 5 - 15  CBC  Result Value Ref Range   WBC 6.5 4.0 - 10.5 K/uL   RBC 4.81 3.87 - 5.11 MIL/uL   Hemoglobin 13.0 12.0 - 15.0 g/dL   HCT 41.9 36.0 - 46.0 %   MCV 87.1 80.0 - 100.0 fL   MCH 27.0 26.0 - 34.0 pg   MCHC 31.0 30.0 - 36.0 g/dL   RDW 14.4 11.5 - 15.5 %   Platelets 353 150 - 400 K/uL   nRBC 0.0 0.0 - 0.2 %  Troponin I  Result Value Ref Range   Troponin I <0.03 <0.03 ng/mL      Assessment & Plan:   Problem List Items Addressed This Visit    None    Visit Diagnoses    Chest pain, unspecified type    -  Primary   EKG normal. Appears to be muscular. Call with any concerns. Treat with flexeril and naproxen. If not better in 1 week, get x-ray.   Relevant Orders   EKG 12-Lead (Completed)       Follow up plan: Return if symptoms worsen or fail to improve.

## 2018-11-28 ENCOUNTER — Ambulatory Visit: Payer: Medicaid Other | Admitting: Psychiatry

## 2018-12-04 ENCOUNTER — Ambulatory Visit (INDEPENDENT_AMBULATORY_CARE_PROVIDER_SITE_OTHER): Payer: Medicaid Other | Admitting: Licensed Clinical Social Worker

## 2018-12-04 ENCOUNTER — Encounter: Payer: Self-pay | Admitting: Licensed Clinical Social Worker

## 2018-12-04 DIAGNOSIS — F3161 Bipolar disorder, current episode mixed, mild: Secondary | ICD-10-CM

## 2018-12-04 DIAGNOSIS — F4310 Post-traumatic stress disorder, unspecified: Secondary | ICD-10-CM | POA: Diagnosis not present

## 2018-12-04 NOTE — Progress Notes (Signed)
   THERAPIST PROGRESS NOTE  Session Time: 1100  Participation Level: Active  Behavioral Response: Well GroomedAlertAnxious  Type of Therapy: Individual Therapy  Treatment Goals addressed: Anxiety  Interventions: CBT  Summary: Carol Ortega is a 47 y.o. female who presents with symptoms related to her diagnosis. Carol Ortega reported her anxiety has improved since our last session, but stated she has continued anxiety about going shopping. LCSW normalized Carol Ortega's feelings, and assisted Carol Ortega in recognizing the unrealistic expectations she was setting for herself about how she "should" feel about various things. Carol Ortega was able to recognize this and expressed understanding. Further, Carol Ortega reported some depression around the holidays, as her son is currently incarcerated. We discussed ways to control the aspects Carol Ortega could control. She was able to identify: sending her son a card, sending her son money, and having his daughter over for Christmas. Carol Ortega noted she has been having anxiety about keeping her house clean. We discussed ways this relates to her childhood. She reported her mother and grandmother were very strict about how the house was kept, but Carol Ortega was also able to recognize that having a clean home was an aspect of her life she has always had control over, versus the trauma in her history that she could not control. Carol Ortega was able to understand this connection and how it may affect her life currently.   Suicidal/Homicidal: No  Therapist Response: Carol Ortega is able to speak openly and honestly about her emotions and anxiety symptoms. She is able to make connections from her past to her life currently, and is able to utilize skills learned in previous sessions in order to manage her symptoms. Moving forward, we will continue to utilize CBT to manage symptoms.   Plan: Return again in 4 weeks.  Diagnosis: Axis I: Post Traumatic Stress Disorder    Axis II: No  diagnosis    Alden Hipp, LCSW 12/04/2018

## 2018-12-24 ENCOUNTER — Ambulatory Visit
Admission: RE | Admit: 2018-12-24 | Discharge: 2018-12-24 | Disposition: A | Discharge status: Home / Self Care | Payer: Medicaid Other | Source: Ambulatory Visit | Attending: Physician Assistant | Admitting: Physician Assistant

## 2018-12-24 DIAGNOSIS — Z1239 Encounter for other screening for malignant neoplasm of breast: Secondary | ICD-10-CM | POA: Insufficient documentation

## 2019-01-23 ENCOUNTER — Ambulatory Visit: Payer: Medicaid Other | Admitting: Psychiatry

## 2019-01-30 ENCOUNTER — Encounter: Payer: Self-pay | Admitting: Licensed Clinical Social Worker

## 2019-01-30 ENCOUNTER — Ambulatory Visit (INDEPENDENT_AMBULATORY_CARE_PROVIDER_SITE_OTHER): Payer: Medicaid Other | Admitting: Licensed Clinical Social Worker

## 2019-01-30 DIAGNOSIS — F3161 Bipolar disorder, current episode mixed, mild: Secondary | ICD-10-CM | POA: Diagnosis not present

## 2019-01-30 NOTE — Progress Notes (Signed)
   THERAPIST PROGRESS NOTE  Session Time: 4627-0350  Participation Level: Active  Behavioral Response: Well GroomedAlertAnxious  Type of Therapy: Individual Therapy  Treatment Goals addressed: Coping  Interventions: Supportive  Summary: Carol Ortega is a 48 y.o. female who presents with continued symptoms of her diagnosis. Carol Ortega reports doing well since our last session, but added, "I've had a few days earlier in the week when I was feeling really depressed and overly emotional and I didn't know what was going on." LCSW asked if Carol Ortega could identify what prompted the depressed mood. She was not able to identify anything and wondered if it could be hormonal. LCSW encouraged Carol Ortega to check in with her doctor regarding that possibility. Carol Ortega expressed understanding and agreement. Carol Ortega reports feeling like she is able to "slip into a depressed mood sometimes because I don't get out of the house enough." LCSW validated those feelings and encouraged Carol Ortega to join a club of some kind--book club, wine club, coffee club, etc. Carol Ortega expressed agreement and stated she had taken steps to find resources as LCSW mentioned. LCSW also encouraged Carol Ortega to look into becoming a peer support worker and explained a bit about that role. Carol Ortega expressed interest in that idea.   Suicidal/Homicidal: No  Therapist Response: Carol Ortega continues to work towards her treatment goals but has not yet reached them. Carol Ortega is able to speak openly about her emotions and attempts to regulate her feelings. We will continue to utilize CBT to continue assisting Carol Ortega in managing her symptoms associated with her diagnosis.   Plan: Return again in 4 weeks.  Diagnosis: Axis I: Bipolar 1 Disorder, mixed, mild    Axis II: No diagnosis    Carol Hipp, LCSW 01/30/2019

## 2019-02-11 ENCOUNTER — Emergency Department
Admission: EM | Admit: 2019-02-11 | Discharge: 2019-02-12 | Disposition: A | Discharge status: Home / Self Care | Payer: Medicaid Other | Attending: Emergency Medicine | Admitting: Emergency Medicine

## 2019-02-11 ENCOUNTER — Other Ambulatory Visit: Payer: Self-pay

## 2019-02-11 ENCOUNTER — Encounter: Payer: Self-pay | Admitting: Emergency Medicine

## 2019-02-11 DIAGNOSIS — Z859 Personal history of malignant neoplasm, unspecified: Secondary | ICD-10-CM | POA: Diagnosis not present

## 2019-02-11 DIAGNOSIS — N390 Urinary tract infection, site not specified: Secondary | ICD-10-CM | POA: Insufficient documentation

## 2019-02-11 DIAGNOSIS — Z91040 Latex allergy status: Secondary | ICD-10-CM | POA: Diagnosis not present

## 2019-02-11 DIAGNOSIS — I10 Essential (primary) hypertension: Secondary | ICD-10-CM | POA: Insufficient documentation

## 2019-02-11 DIAGNOSIS — Z79899 Other long term (current) drug therapy: Secondary | ICD-10-CM | POA: Diagnosis not present

## 2019-02-11 DIAGNOSIS — R82998 Other abnormal findings in urine: Secondary | ICD-10-CM | POA: Insufficient documentation

## 2019-02-11 DIAGNOSIS — R350 Frequency of micturition: Secondary | ICD-10-CM | POA: Diagnosis present

## 2019-02-11 LAB — URINALYSIS, COMPLETE (UACMP) WITH MICROSCOPIC
BILIRUBIN URINE: NEGATIVE
Glucose, UA: NEGATIVE mg/dL
HGB URINE DIPSTICK: NEGATIVE
Ketones, ur: NEGATIVE mg/dL
LEUKOCYTE UA: NEGATIVE
NITRITE: NEGATIVE
PROTEIN: NEGATIVE mg/dL
Specific Gravity, Urine: 1.018 (ref 1.005–1.030)
pH: 6 (ref 5.0–8.0)

## 2019-02-11 LAB — CBC WITH DIFFERENTIAL/PLATELET
ABS IMMATURE GRANULOCYTES: 0.01 10*3/uL (ref 0.00–0.07)
BASOS PCT: 1 %
Basophils Absolute: 0.1 10*3/uL (ref 0.0–0.1)
Eosinophils Absolute: 0.3 10*3/uL (ref 0.0–0.5)
Eosinophils Relative: 4 %
HCT: 43.3 % (ref 36.0–46.0)
HEMOGLOBIN: 13.4 g/dL (ref 12.0–15.0)
Immature Granulocytes: 0 %
LYMPHS PCT: 47 %
Lymphs Abs: 3.3 10*3/uL (ref 0.7–4.0)
MCH: 27.3 pg (ref 26.0–34.0)
MCHC: 30.9 g/dL (ref 30.0–36.0)
MCV: 88.4 fL (ref 80.0–100.0)
MONO ABS: 0.7 10*3/uL (ref 0.1–1.0)
Monocytes Relative: 10 %
NEUTROS ABS: 2.7 10*3/uL (ref 1.7–7.7)
Neutrophils Relative %: 38 %
Platelets: 333 10*3/uL (ref 150–400)
RBC: 4.9 MIL/uL (ref 3.87–5.11)
RDW: 14.4 % (ref 11.5–15.5)
WBC: 7 10*3/uL (ref 4.0–10.5)
nRBC: 0 % (ref 0.0–0.2)

## 2019-02-11 LAB — COMPREHENSIVE METABOLIC PANEL
ALBUMIN: 3.8 g/dL (ref 3.5–5.0)
ALK PHOS: 73 U/L (ref 38–126)
ALT: 12 U/L (ref 0–44)
AST: 16 U/L (ref 15–41)
Anion gap: 7 (ref 5–15)
BILIRUBIN TOTAL: 0.2 mg/dL — AB (ref 0.3–1.2)
BUN: 8 mg/dL (ref 6–20)
CALCIUM: 9.1 mg/dL (ref 8.9–10.3)
CO2: 28 mmol/L (ref 22–32)
Chloride: 105 mmol/L (ref 98–111)
Creatinine, Ser: 0.73 mg/dL (ref 0.44–1.00)
GFR calc Af Amer: >60 mL/min (ref >60)
GFR calc non Af Amer: >60 mL/min (ref >60)
GLUCOSE: 112 mg/dL — AB (ref 70–99)
Potassium: 3.9 mmol/L (ref 3.5–5.1)
SODIUM: 140 mmol/L (ref 135–145)
TOTAL PROTEIN: 7.6 g/dL (ref 6.5–8.1)

## 2019-02-11 LAB — LIPASE, BLOOD: Lipase: 33 U/L (ref 11–51)

## 2019-02-11 LAB — POCT PREGNANCY, URINE: PREG TEST UR: NEGATIVE

## 2019-02-11 MED ORDER — NITROFURANTOIN MONOHYD MACRO 100 MG PO CAPS: 100.0000 mg | ORAL_CAPSULE | Freq: Two times a day (BID) | ORAL | 0 refills | Status: AC

## 2019-02-11 MED ORDER — NITROFURANTOIN MONOHYD MACRO 100 MG PO CAPS
100.0000 mg | ORAL_CAPSULE | Freq: Once | ORAL | Status: AC
  Administered 2019-02-11: 100 mg via ORAL
  Filled 2019-02-11: qty 1

## 2019-02-11 NOTE — ED Triage Notes (Signed)
Patient ambulatory to triage with steady gait, without difficulty or distress noted; pt reports right abd pain accomp by odorous urine

## 2019-02-11 NOTE — ED Provider Notes (Signed)
[facility] Emergency Department Provider Note   ____________________________________________    I have reviewed the triage vital signs and the nursing notes.   HISTORY  Chief Complaint Abdominal Pain     HPI Carol Ortega is a 48 y.o. female who presents with complaints of urinary frequency, foul smelling urine x 2 days. Has been taking azo with some relief. Feels like UTI. No nausea/vomiting. No fevers or chills. Denies back pain to me.   Past Medical History:  Diagnosis Date  . Anemia   . Anxiety   . Asthma    seasonal  . Benign brain tumor ([place])   . Brain tumor ([place]) 2014   tx with radiation  . Cancer ([place])   . Complication of anesthesia 1999   lung collapse after surgery for gallbladder  . Depression   . Ectopic pregnancy   . Headache    migraines  . Hoarseness of voice   . Hypertension   . Kidney stone   . Obesity   . Parathyroid abnormality ([place])   . Pulmonary emboli ([place]) 2011  . Pulmonary embolism ([place])   . Radiation    Brain tumor  . UTI (urinary tract infection)   . Vitamin D deficiency     Patient Active Problem List   Diagnosis Date Noted  . Insomnia 07/30/2018  . Chronic pain of both knees 07/30/2018  . Morbid obesity due to excess calories ([place]) 05/09/2018  . Depression, recurrent ([place]) 05/09/2018  . GAD (generalized anxiety disorder) 05/09/2018  . DVT (deep venous thrombosis) ([place]) 04/15/2018  . Bladder spasms 03/12/2018  . Ureteral stone with hydronephrosis 02/28/2018  . Kidney stone 04/09/2017  . H/O pulmonary embolus during pregnancy 08/28/2016  . S/P laparoscopic cholecystectomy 08/28/2016  . Hyperparathyroidism ([place]) 09/10/2015  . Vitamin D deficiency 06/25/2014  . Hypercalcemia 02/09/2014  . Essential hypertension 01/30/2014  . Hyperlipidemia 01/30/2014  . 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 Adult body mass index 50.0-59.9 ([place]) 03/25/2013  . Anxiety state 03/25/2013  . Asthma 03/25/2013    . Obstructive sleep apnea 03/25/2013  . Personal history of venous thrombosis and embolism 03/25/2013  . 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 Brain tumor ([place]) 12/18/2012  . 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 CHOLECYSTECTOMY  1999  . CYSTOSCOPY W/ RETROGRADES Left 03/01/2018   Procedure: CYSTOSCOPY WITH RETROGRADE PYELOGRAM;  Surgeon: Abbie Sons, MD;  Location: [facility];  Service: Urology;  Laterality: Left;  . CYSTOSCOPY W/ URETERAL STENT PLACEMENT Right 04/08/2017   Procedure: CYSTOSCOPY WITH RETROGRADE PYELOGRAM/URETERAL STENT PLACEMENT;  Surgeon: Rana Snare, MD;  Location: [facility];  Service: Urology;  Laterality: Right;  . CYSTOSCOPY W/ URETERAL STENT PLACEMENT Left 03/12/2018   Procedure: CYSTOSCOPY WITH STENT REPLACEMENT;  Surgeon: Abbie Sons, MD;  Location: [facility];  Service: Urology;  Laterality: Left;  . CYSTOSCOPY W/ URETEROSCOPY    . CYSTOSCOPY WITH RETROGRADE PYELOGRAM, URETEROSCOPY AND STENT PLACEMENT Left 01/20/2014   Procedure: LEFT URETEROSCOPY WITH STENT PLACEMENT;  Surgeon: Irine Seal, MD;  Location: [facility];  Service: Urology;  Laterality: Left;  . CYSTOSCOPY WITH RETROGRADE PYELOGRAM, URETEROSCOPY AND STENT PLACEMENT Bilateral 04/23/2015   Procedure: CYSTOSCOPY WITH BILATERAL RETROGRADE PYELOGRAM, URETEROSCOPY AND STENT PLACEMENT;  Surgeon: Alexis Frock, MD;  Location: [facility];  Service: Urology;  Laterality: Bilateral;  . CYSTOSCOPY WITH RETROGRADE PYELOGRAM, URETEROSCOPY AND STENT PLACEMENT Right 04/13/2017   Procedure: CYSTOSCOPY WITH RETROGRADE PYELOGRAM, URETEROSCOPY AND STENT EXCHANGE;  Surgeon: Alexis Frock, MD;  Location: [facility];  Service: Urology;  Laterality: Right;  . CYSTOSCOPY WITH STENT PLACEMENT Left 01/12/2014   Procedure: CYSTOSCOPY WITH STENT PLACEMENT left retrograde;  Surgeon: Irine Seal, MD;  Location: [facility];  Service: Urology;  Laterality: Left;  . CYSTOSCOPY WITH STENT  PLACEMENT Left 03/01/2018   Procedure: CYSTOSCOPY WITH STENT PLACEMENT;  Surgeon: Abbie Sons, MD;  Location: [facility];  Service: Urology;  Laterality: Left;  . CYSTOSCOPY/URETEROSCOPY/HOLMIUM LASER/STENT PLACEMENT Left 03/12/2018   Procedure: CYSTOSCOPY/URETEROSCOPY/HOLMIUM LASER;  Surgeon: Abbie Sons, MD;  Location: [facility];  Service: Urology;  Laterality: Left;  . HOLMIUM LASER APPLICATION Left 06/20/813   Procedure: HOLMIUM LASER APPLICATION;  Surgeon: Irine Seal, MD;  Location: [facility];  Service: Urology;  Laterality: Left;  . HOLMIUM LASER APPLICATION Bilateral 03/26/1855   Procedure: HOLMIUM LASER APPLICATION;  Surgeon: Alexis Frock, MD;  Location: [facility];  Service: Urology;  Laterality: Bilateral;  . HOLMIUM LASER APPLICATION Right 02/28/9701   Procedure: HOLMIUM LASER APPLICATION;  Surgeon: Alexis Frock, MD;  Location: [facility];  Service: Urology;  Laterality: Right;  . kidney stone removal    . LITHOTRIPSY    . PARATHYROIDECTOMY Right 11/13/2016   Procedure: RIGHT SUPERIOR PARATHYROIDECTOMY;  Surgeon: Armandina Gemma, MD;  Location: [facility];  Service: General;  Laterality: Right;  . surgery for,ectopic pregnancy  2011    1 fallopian tube rupture  . TUBAL LIGATION  2011    Prior to Admission medications   Medication Sig Start Date End Date Taking? Authorizing Provider  acetaminophen (TYLENOL) 500 MG tablet Take 500 mg by mouth every 6 (six) hours as needed.    [provider]  albuterol (PROVENTIL HFA;VENTOLIN HFA) 108 (90 Base) MCG/ACT inhaler Inhale 2 puffs into the lungs every 6 (six) hours as needed for wheezing or shortness of breath. 03/04/18   Loletha Grayer, MD  albuterol (PROVENTIL) (2.5 MG/3ML) 0.083% nebulizer solution Take 3 mLs (2.5 mg total) by nebulization every 6 (six) hours as needed for wheezing or shortness of breath. 03/04/18   Loletha Grayer, MD  ARIPiprazole (ABILIFY) 10 MG tablet Take 1 tablet (10 mg total) by mouth daily. For mood 09/23/18   Ursula Alert, MD  butalbital-acetaminophen-caffeine (FIORICET, ESGIC) 50-325-40 MG tablet Take 1-2 tablets by mouth every 6 (six) hours as needed for headache. 10/02/18 10/02/19  Schaevitz, Randall An, MD  citalopram (CELEXA) 10 MG tablet Take 1 tablet (10 mg total) by mouth daily with breakfast. For anxiety 10/23/18   Ursula Alert, MD  cyclobenzaprine (FLEXERIL) 10 MG tablet Take 1 tablet (10 mg total) by mouth 3 (three) times daily as needed for muscle spasms. DO NOT DRIVE ON THIS MEDICINE 11/27/18   Park Liter P, DO  diclofenac sodium (VOLTAREN) 1 % GEL Apply 4 g topically 4 (four) times daily. 07/30/18   Johnson, Megan P, DO  diphenhydrAMINE (BENADRYL) 25 MG tablet Take 25 mg by mouth daily as needed for allergies.    [provider]  hydrOXYzine (VISTARIL) 25 MG capsule Take 1 capsule (25 mg total) by mouth 2 (two) times daily as needed (severe anxiety symptoms). 10/23/18   Ursula Alert, MD  naproxen (NAPROSYN) 500 MG tablet Take 1 tablet (500 mg total) by mouth 2 (two) times daily with a meal. 11/27/18   Johnson, Megan P, DO  nitrofurantoin, macrocrystal-monohydrate, (MACROBID) 100 MG capsule Take 1 capsule (100 mg total) by mouth 2 (two) times daily for 7 days. 02/11/19 02/18/19  Lavonia Drafts, MD  omeprazole (  PRILOSEC) 20 MG capsule Take 1 capsule (20 mg total) by mouth daily. 09/26/18   Johnson, Megan P, DO  propranolol (INDERAL) 10 MG tablet TAKE 1 TABLET BY MOUTH THREE TIMES DAILY AS NEEDED FOR SEVERE ANXIETY SYMPTOMS 09/11/18   [provider]  traZODone (DESYREL) 150 MG tablet Take 1 tablet (150 mg total) by mouth at bedtime. 09/23/18   Ursula Alert, MD  Vitamin D, Ergocalciferol, (DRISDOL) 50000 units CAPS capsule Take 1 capsule (50,000 Units total) by mouth every 7 (seven) days. 07/31/18   Park Liter P, DO     Allergies Bee venom; Contrast media [iodinated diagnostic agents]; Eggs or egg-derived products; Other; Penicillins; Wellbutrin [bupropion]; Latex; and  Oxycodone-acetaminophen  Family History  Problem Relation Age of Onset  . Bell's palsy Mother   . Heart failure Mother   . Hypertension Mother   . Lupus Mother   . Anxiety disorder Mother   . Depression Mother   . Stroke Mother   . Asthma Father   . Hypertension Father   . Hyperlipidemia Father   . Anxiety disorder Father   . Depression Father   . Hypertension Maternal Grandmother   . Diabetes Maternal Grandmother   . Sarcoidosis Maternal Grandmother   . Anxiety disorder Sister   . Depression Sister   . Bipolar disorder Sister   . Depression Son   . Bipolar disorder Son     Social History Social History   Tobacco Use  . Smoking status: Never Smoker  . Smokeless tobacco: Never Used  Substance Use Topics  . Alcohol use: Not Currently    Comment: soical  . Drug use: No    Review of Systems  Constitutional: as above   Gastrointestinal: as above Genitourinary: as above Musculoskeletal: Negative for back pain. Skin: Negative for rash. Neurological: Negative for headaches   ____________________________________________   PHYSICAL EXAM:  VITAL SIGNS: ED Triage Vitals [02/11/19 1916]  Enc Vitals Group     BP 111/79     Pulse Rate 85     Resp 18     Temp 98 F (36.7 C)     Temp Source Oral     SpO2 100 %     Weight (!) 139.3 kg (307 lb)     Height 1.676 m (5\' 6" )     Head Circumference      Peak Flow      Pain Score 2     Pain Loc      Pain Edu?      Excl. in [place]?     Constitutional: Alert and oriented.      Cardiovascular: Normal rate, regular rhythm.  Good peripheral circulation. Respiratory: Normal respiratory effort.  No retractions. Lungs CTAB. Gastrointestinal: Soft and nontender. No distention.  No CVA tenderness.  Musculoskeletal: .  Warm and well perfused Neurologic:  Normal speech and language. No gross focal neurologic deficits are appreciated.  Skin:  Skin is warm, dry and intact. No rash noted. Psychiatric: Mood and affect are  normal. Speech and behavior are normal.  ____________________________________________   LABS (all labs ordered are listed, but only abnormal results are displayed)  Labs Reviewed  COMPREHENSIVE METABOLIC PANEL - Abnormal; Notable for the following components:      Result Value   Glucose, Bld 112 (*)    Total Bilirubin 0.2 (*)    All other components within normal limits  URINALYSIS, COMPLETE (UACMP) WITH MICROSCOPIC - Abnormal; Notable for the following components:   Color, Urine YELLOW (*)  APPearance CLEAR (*)    Bacteria, UA RARE (*)    All other components within normal limits  CBC WITH DIFFERENTIAL/PLATELET  LIPASE, BLOOD  POCT PREGNANCY, URINE   ____________________________________________  EKG   ____________________________________________  RADIOLOGY   ____________________________________________   PROCEDURES  Procedure(s) performed: No  Procedures   Critical Care performed: No ____________________________________________   INITIAL IMPRESSION / ASSESSMENT AND PLAN / ED COURSE  Pertinent labs & imaging results that were available during my care of the patient were reviewed by me and considered in my medical decision making (see chart for details).  Patient well appearing very reassuring exam. She is here with her son who is a patient but she also opted to check in. Clinically c/w UTI, will cover with macrobid.   Appropriate for outpatient folllow up    ____________________________________________   FINAL CLINICAL IMPRESSION(S) / ED DIAGNOSES  Final diagnoses:  Lower urinary tract infectious disease        Note:  This document was prepared using Dragon voice recognition software and may include unintentional dictation errors.   Lavonia Drafts, MD 02/11/19 [phone]

## 2019-02-11 NOTE — ED Notes (Signed)
EDP in with patient 

## 2019-02-11 NOTE — ED Notes (Signed)
Patient states she was urinating a lot then to hardly any thing and that she did it has a strong odor. Patient states she get UTI and kidney stones often. Patient states she thought was passing a kidney stone 2 weeks ago.

## 2019-07-10 ENCOUNTER — Emergency Department: Payer: Medicaid Other

## 2019-07-10 ENCOUNTER — Emergency Department
Admission: EM | Admit: 2019-07-10 | Discharge: 2019-07-10 | Disposition: A | Discharge status: Home / Self Care | Payer: Medicaid Other | Attending: Emergency Medicine | Admitting: Emergency Medicine

## 2019-07-10 ENCOUNTER — Other Ambulatory Visit: Payer: Self-pay

## 2019-07-10 ENCOUNTER — Encounter: Payer: Self-pay | Admitting: Emergency Medicine

## 2019-07-10 DIAGNOSIS — N939 Abnormal uterine and vaginal bleeding, unspecified: Secondary | ICD-10-CM | POA: Insufficient documentation

## 2019-07-10 DIAGNOSIS — Z79899 Other long term (current) drug therapy: Secondary | ICD-10-CM | POA: Diagnosis not present

## 2019-07-10 DIAGNOSIS — Z91040 Latex allergy status: Secondary | ICD-10-CM | POA: Diagnosis not present

## 2019-07-10 DIAGNOSIS — Z9049 Acquired absence of other specified parts of digestive tract: Secondary | ICD-10-CM | POA: Diagnosis not present

## 2019-07-10 DIAGNOSIS — I10 Essential (primary) hypertension: Secondary | ICD-10-CM | POA: Insufficient documentation

## 2019-07-10 DIAGNOSIS — Z859 Personal history of malignant neoplasm, unspecified: Secondary | ICD-10-CM | POA: Insufficient documentation

## 2019-07-10 DIAGNOSIS — J45909 Unspecified asthma, uncomplicated: Secondary | ICD-10-CM | POA: Diagnosis not present

## 2019-07-10 LAB — CBC
HCT: 38.5 % (ref 36.0–46.0)
Hemoglobin: 11.9 g/dL — ABNORMAL LOW (ref 12.0–15.0)
MCH: 27.7 pg (ref 26.0–34.0)
MCHC: 30.9 g/dL (ref 30.0–36.0)
MCV: 89.7 fL (ref 80.0–100.0)
Platelets: 317 10*3/uL (ref 150–400)
RBC: 4.29 MIL/uL (ref 3.87–5.11)
RDW: 14.4 % (ref 11.5–15.5)
WBC: 6 10*3/uL (ref 4.0–10.5)
nRBC: 0 % (ref 0.0–0.2)

## 2019-07-10 LAB — BASIC METABOLIC PANEL
Anion gap: 5 (ref 5–15)
BUN: 8 mg/dL (ref 6–20)
CO2: 28 mmol/L (ref 22–32)
Calcium: 8.7 mg/dL — ABNORMAL LOW (ref 8.9–10.3)
Chloride: 105 mmol/L (ref 98–111)
Creatinine, Ser: 0.72 mg/dL (ref 0.44–1.00)
GFR calc Af Amer: >60 mL/min (ref >60)
GFR calc non Af Amer: >60 mL/min (ref >60)
Glucose, Bld: 103 mg/dL — ABNORMAL HIGH (ref 70–99)
Potassium: 3.9 mmol/L (ref 3.5–5.1)
Sodium: 138 mmol/L (ref 135–145)

## 2019-07-10 LAB — PROTIME-INR
INR: 1 (ref 0.8–1.2)
Prothrombin Time: 13.1 seconds (ref 11.4–15.2)

## 2019-07-10 LAB — POCT PREGNANCY, URINE: Preg Test, Ur: NEGATIVE

## 2019-07-10 MED ORDER — METHYLERGONOVINE MALEATE 0.2 MG/ML IJ SOLN
0.2000 mg | Freq: Once | INTRAMUSCULAR | Status: AC
  Administered 2019-07-10: 0.2 mg via INTRAMUSCULAR
  Filled 2019-07-10: qty 1

## 2019-07-10 MED ORDER — ACETAMINOPHEN 500 MG PO TABS
1000.0000 mg | ORAL_TABLET | Freq: Once | ORAL | Status: AC
  Administered 2019-07-10: 22:00:00 1000 mg via ORAL
  Filled 2019-07-10: qty 2

## 2019-07-10 NOTE — ED Triage Notes (Signed)
Pt in via POV, reports menstrual cycle x one month, saturating approximately 2 pads per hour on some days, presenting with pelvic pain and lower back pain today.  Pt reports hx of same, needing medication to stop bleeding.  Pt reports dizziness upon standing.  Vitals WDL, NAD noted at this time.

## 2019-07-10 NOTE — ED Provider Notes (Addendum)
[facility] Emergency Department Provider Note  Time seen: 9:33 PM  I have reviewed the triage vital signs and the nursing notes.   HISTORY  Chief Complaint Vaginal Bleeding   HPI Carol Ortega is a 48 y.o. female with a past medical history of anemia, anxiety, depression, presents to the emergency department for vaginal bleeding.   According to the patient the third episode over the last several years, she has had heavy vaginal bleeding.  Patient states for the past 11 months she is not had any vaginal bleeding until 3 weeks ago when she began vaginal bleeding once again.  Patient states it is heavy at times.  Patient states over the past several days she has been somewhat weak and lightheaded at times as well.  Denies any fever.  Denies cough congestion or shortness of breath.  Did state a mild scratchiness to her throat.  States abdominal "soreness," but denies any "pain."  Negative for dysuria.  Past Medical History:  Diagnosis Date  . Anemia   . Anxiety   . Asthma    seasonal  . Benign brain tumor ([place])   . Brain tumor ([place]) 2014   tx with radiation  . Cancer ([place])   . Complication of anesthesia 1999   lung collapse after surgery for gallbladder  . Depression   . Ectopic pregnancy   . Headache    migraines  . Hoarseness of voice   . Hypertension   . Kidney stone   . Obesity   . Parathyroid abnormality ([place])   . Pulmonary emboli ([place]) 2011  . Pulmonary embolism ([place])   . Radiation    Brain tumor  . UTI (urinary tract infection)   . Vitamin D deficiency     Patient Active Problem List   Diagnosis Date Noted  . Insomnia 07/30/2018  . Chronic pain of both knees 07/30/2018  . Morbid obesity due to excess calories ([place]) 05/09/2018  . Depression, recurrent ([place]) 05/09/2018  . GAD (generalized anxiety disorder) 05/09/2018  . DVT (deep venous thrombosis) ([place]) 04/15/2018  . Bladder spasms 03/12/2018  . Ureteral stone with hydronephrosis  02/28/2018  . Kidney stone 04/09/2017  . H/O pulmonary embolus during pregnancy 08/28/2016  . S/P laparoscopic cholecystectomy 08/28/2016  . Hyperparathyroidism ([place]) 09/10/2015  . Vitamin D deficiency 06/25/2014  . Hypercalcemia 02/09/2014  . Essential hypertension 01/30/2014  . Hyperlipidemia 01/30/2014  . 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 Adult body mass index 50.0-59.9 ([place]) 03/25/2013  . Anxiety state 03/25/2013  . Asthma 03/25/2013  . Obstructive sleep apnea 03/25/2013  . Personal history of venous thrombosis and embolism 03/25/2013  . 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 Brain tumor ([place]) 12/18/2012  . 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 CHOLECYSTECTOMY  1999  . CYSTOSCOPY W/ RETROGRADES Left 03/01/2018   Procedure: CYSTOSCOPY WITH RETROGRADE PYELOGRAM;  Surgeon: Abbie Sons, MD;  Location: [facility];  Service: Urology;  Laterality: Left;  . CYSTOSCOPY W/ URETERAL STENT PLACEMENT Right 04/08/2017   Procedure: CYSTOSCOPY WITH RETROGRADE PYELOGRAM/URETERAL STENT PLACEMENT;  Surgeon: Rana Snare, MD;  Location: [facility];  Service: Urology;  Laterality: Right;  . CYSTOSCOPY W/ URETERAL STENT PLACEMENT Left 03/12/2018   Procedure: CYSTOSCOPY WITH STENT REPLACEMENT;  Surgeon: Abbie Sons, MD;  Location: [facility];  Service: Urology;  Laterality: Left;  . CYSTOSCOPY W/ URETEROSCOPY    . CYSTOSCOPY WITH RETROGRADE PYELOGRAM, URETEROSCOPY AND STENT PLACEMENT Left 01/20/2014  Procedure: LEFT URETEROSCOPY WITH STENT PLACEMENT;  Surgeon: Irine Seal, MD;  Location: [facility];  Service: Urology;  Laterality: Left;  . CYSTOSCOPY WITH RETROGRADE PYELOGRAM, URETEROSCOPY AND STENT PLACEMENT Bilateral 04/23/2015   Procedure: CYSTOSCOPY WITH BILATERAL RETROGRADE PYELOGRAM, URETEROSCOPY AND STENT PLACEMENT;  Surgeon: Alexis Frock, MD;  Location: [facility];  Service: Urology;  Laterality: Bilateral;  . CYSTOSCOPY WITH RETROGRADE  PYELOGRAM, URETEROSCOPY AND STENT PLACEMENT Right 04/13/2017   Procedure: CYSTOSCOPY WITH RETROGRADE PYELOGRAM, URETEROSCOPY AND STENT EXCHANGE;  Surgeon: Alexis Frock, MD;  Location[facility];  Service: Urology;  Laterality: Right;  . CYSTOSCOPY WITH STENT PLACEMENT Left 01/12/2014   Procedure: CYSTOSCOPY WITH STENT PLACEMENT left retrograde;  Surgeon: Irine Seal, MD;  Location: [facility];  Service: Urology;  Laterality: Left;  . CYSTOSCOPY WITH STENT PLACEMENT Left 03/01/2018   Procedure: CYSTOSCOPY WITH STENT PLACEMENT;  Surgeon: Abbie Sons, MD;  Location: [facility];  Service: Urology;  Laterality: Left;  . CYSTOSCOPY/URETEROSCOPY/HOLMIUM LASER/STENT PLACEMENT Left 03/12/2018   Procedure: CYSTOSCOPY/URETEROSCOPY/HOLMIUM LASER;  Surgeon: Abbie Sons, MD;  Location: [facility];  Service: Urology;  Laterality: Left;  . HOLMIUM LASER APPLICATION Left 06/25/1504   Procedure: HOLMIUM LASER APPLICATION;  Surgeon: Irine Seal, MD;  Location: [facility];  Service: Urology;  Laterality: Left;  . HOLMIUM LASER APPLICATION Bilateral 05/27/7947   Procedure: HOLMIUM LASER APPLICATION;  Surgeon: Alexis Frock, MD;  Location: [facility];  Service: Urology;  Laterality: Bilateral;  . HOLMIUM LASER APPLICATION Right 0/16/5537   Procedure: HOLMIUM LASER APPLICATION;  Surgeon: Alexis Frock, MD;  Location: [facility];  Service: Urology;  Laterality: Right;  . kidney stone removal    . LITHOTRIPSY    . PARATHYROIDECTOMY Right 11/13/2016   Procedure: RIGHT SUPERIOR PARATHYROIDECTOMY;  Surgeon: Armandina Gemma, MD;  Location: [facility];  Service: General;  Laterality: Right;  . surgery for,ectopic pregnancy  2011    1 fallopian tube rupture  . TUBAL LIGATION  2011    Prior to Admission medications   Medication Sig Start Date End Date Taking? Authorizing Provider  acetaminophen (TYLENOL) 500 MG tablet Take 500 mg by mouth every 6 (six) hours as needed.    [provider]  albuterol (PROVENTIL HFA;VENTOLIN HFA) 108 (90 Base)  MCG/ACT inhaler Inhale 2 puffs into the lungs every 6 (six) hours as needed for wheezing or shortness of breath. 03/04/18   Loletha Grayer, MD  albuterol (PROVENTIL) (2.5 MG/3ML) 0.083% nebulizer solution Take 3 mLs (2.5 mg total) by nebulization every 6 (six) hours as needed for wheezing or shortness of breath. 03/04/18   Loletha Grayer, MD  ARIPiprazole (ABILIFY) 10 MG tablet Take 1 tablet (10 mg total) by mouth daily. For mood 09/23/18   Ursula Alert, MD  butalbital-acetaminophen-caffeine (FIORICET, ESGIC) 50-325-40 MG tablet Take 1-2 tablets by mouth every 6 (six) hours as needed for headache. 10/02/18 10/02/19  Schaevitz, Randall An, MD  citalopram (CELEXA) 10 MG tablet Take 1 tablet (10 mg total) by mouth daily with breakfast. For anxiety 10/23/18   Ursula Alert, MD  cyclobenzaprine (FLEXERIL) 10 MG tablet Take 1 tablet (10 mg total) by mouth 3 (three) times daily as needed for muscle spasms. DO NOT DRIVE ON THIS MEDICINE 11/27/18   Park Liter P, DO  diclofenac sodium (VOLTAREN) 1 % GEL Apply 4 g topically 4 (four) times daily. 07/30/18   Johnson, Megan P, DO  diphenhydrAMINE (BENADRYL) 25 MG tablet Take 25 mg by mouth daily as needed for allergies.    [provider]  hydrOXYzine (VISTARIL)  25 MG capsule Take 1 capsule (25 mg total) by mouth 2 (two) times daily as needed (severe anxiety symptoms). 10/23/18   Ursula Alert, MD  naproxen (NAPROSYN) 500 MG tablet Take 1 tablet (500 mg total) by mouth 2 (two) times daily with a meal. 11/27/18   Johnson, Megan P, DO  omeprazole (PRILOSEC) 20 MG capsule Take 1 capsule (20 mg total) by mouth daily. 09/26/18   Johnson, Megan P, DO  propranolol (INDERAL) 10 MG tablet TAKE 1 TABLET BY MOUTH THREE TIMES DAILY AS NEEDED FOR SEVERE ANXIETY SYMPTOMS 09/11/18   [provider]  traZODone (DESYREL) 150 MG tablet Take 1 tablet (150 mg total) by mouth at bedtime. 09/23/18   Ursula Alert, MD  Vitamin D, Ergocalciferol, (DRISDOL)  50000 units CAPS capsule Take 1 capsule (50,000 Units total) by mouth every 7 (seven) days. 07/31/18   Park Liter P, DO    Allergies  Allergen Reactions  . Bee Venom Anaphylaxis  . Contrast Media [Iodinated Diagnostic Agents] Anaphylaxis  . Eggs Or Egg-Derived Products Anaphylaxis  . Other Anaphylaxis    Surgical dye.. Pt states ok to take if previously taken benadryl or prednisone  . Penicillins Hives and Other (See Comments)    Has patient had a PCN reaction causing immediate rash, facial/tongue/throat swelling, SOB or lightheadedness with hypotension: No Has patient had a PCN reaction causing severe rash involving mucus membranes or skin necrosis: No Has patient had a PCN reaction that required hospitalization No Has patient had a PCN reaction occurring within the last 10 years: No If all of the above answers are "NO", then may proceed with Cephalosporin use.  . Wellbutrin [Bupropion] Other (See Comments)    irritiability  . Latex Rash  . Oxycodone-Acetaminophen Itching    Family History  Problem Relation Age of Onset  . Bell's palsy Mother   . Heart failure Mother   . Hypertension Mother   . Lupus Mother   . Anxiety disorder Mother   . Depression Mother   . Stroke Mother   . Asthma Father   . Hypertension Father   . Hyperlipidemia Father   . Anxiety disorder Father   . Depression Father   . Hypertension Maternal Grandmother   . Diabetes Maternal Grandmother   . Sarcoidosis Maternal Grandmother   . Anxiety disorder Sister   . Depression Sister   . Bipolar disorder Sister   . Depression Son   . Bipolar disorder Son     Social History Social History   Tobacco Use  . Smoking status: Never Smoker  . Smokeless tobacco: Never Used  Substance Use Topics  . Alcohol use: Not Currently  . Drug use: No    Review of Systems Constitutional: Negative for fever. Cardiovascular: Negative for chest pain. Respiratory: Negative for shortness of  breath. Gastrointestinal: Positive for lower abdominal "soreness."  Negative for vomiting or diarrhea. Genitourinary: Positive for vaginal bleeding.  Negative for dysuria. Musculoskeletal: Negative for musculoskeletal complaints Skin: Negative for skin complaints  Neurological: Negative for headache All other ROS negative  ____________________________________________   PHYSICAL EXAM:  VITAL SIGNS: ED Triage Vitals  Enc Vitals Group     BP 07/10/19 1826 (!) 132/94     Pulse Rate 07/10/19 1826 86     Resp --      Temp 07/10/19 1826 99.1 F (37.3 C)     Temp Source 07/10/19 1826 Oral     SpO2 07/10/19 1826 100 %     Weight 07/10/19 1827 297 lb (  134.7 kg)     Height 07/10/19 1827 5\' 6"  (1.676 m)     Head Circumference --      Peak Flow --      Pain Score 07/10/19 1853 5     Pain Loc --      Pain Edu? --      Excl. in [place]? --    Constitutional: Alert and oriented. Well appearing and in no distress. Eyes: Normal exam ENT      Head: Normocephalic and atraumatic.      Mouth/Throat: Mucous membranes are moist. Cardiovascular: Normal rate, regular rhythm. No murmur Respiratory: Normal respiratory effort without tachypnea nor retractions. Breath sounds are clear  Gastrointestinal: Soft, mild lower abdominal tenderness palpation.  No rebound guarding or distention. Musculoskeletal: Nontender with normal range of motion in all extremities. Neurologic:  Normal speech and language. No gross focal neurologic deficits  Skin:  Skin is warm, dry and intact.  Psychiatric: Mood and affect are normal.   ____________________________________________   RADIOLOGY  IMPRESSION:  1. Endometrial stripe measures within normal limits at 8.9 mm in  thickness. If bleeding remains unresponsive to hormonal or medical  therapy, sonohysterogram should be considered for focal lesion  work-up. (Ref: Radiological Reasoning: Algorithmic Workup of  Abnormal Vaginal Bleeding with Endovaginal Sonography and   Sonohysterography. AJR 2008; [id number]).  2. Otherwise normal sonographic appearance of the uterus.  3. Nonvisualization of the ovaries. No adnexal mass or free fluid  within the pelvis.   ____________________________________________   INITIAL IMPRESSION / ASSESSMENT AND PLAN / ED COURSE  Pertinent labs & imaging results that were available during my care of the patient were reviewed by me and considered in my medical decision making (see chart for details).   Patient presents emergency department for lower abdominal discomfort and vaginal bleeding.  Differential would include perimenopausal state, fibroid, dysfunctional uterine bleeding.  Patient's lab work overall reassuring, 1.5 point drop in hemoglobin.  We will proceed with ultrasound to further evaluate.  Patient agreeable to plan of care.  We will treat with 0.2 mg of IM Methergine while awaiting ultrasound.  Labs are largely reassuring.  Ultrasound shows no acute abnormality besides thickened endometrial stripe.  Patient will follow-up with OB/GYN.  Patient agreeable plan of care.  Carol Ortega was evaluated in Emergency Department on 07/10/2019 for the symptoms described in the history of present illness. She was evaluated in the context of the global COVID-19 pandemic, which necessitated consideration that the patient might be at risk for infection with the SARS-CoV-2 virus that causes COVID-19. Institutional protocols and algorithms that pertain to the evaluation of patients at risk for COVID-19 are in a state of rapid change based on information released by regulatory bodies including the CDC and federal and state organizations. These policies and algorithms were followed during the patient's care in the ED.  ____________________________________________   FINAL CLINICAL IMPRESSION(S) / ED DIAGNOSES  Vaginal bleeding   Harvest Dark, MD 07/10/19 2257    Harvest Dark, MD 07/10/19 [id number]

## 2019-07-10 NOTE — ED Notes (Signed)
Pt ambulatory to bathroom independently.  Likelihood of delay explained to patient at this time.

## 2019-07-13 ENCOUNTER — Other Ambulatory Visit: Payer: Self-pay

## 2019-07-13 ENCOUNTER — Encounter: Payer: Self-pay | Admitting: Emergency Medicine

## 2019-07-13 ENCOUNTER — Ambulatory Visit
Admission: EM | Admit: 2019-07-13 | Discharge: 2019-07-13 | Disposition: A | Discharge status: Home / Self Care | Payer: Medicaid Other | Attending: Family Medicine | Admitting: Family Medicine

## 2019-07-13 DIAGNOSIS — N939 Abnormal uterine and vaginal bleeding, unspecified: Secondary | ICD-10-CM

## 2019-07-13 LAB — CBC WITH DIFFERENTIAL/PLATELET
Abs Immature Granulocytes: 0.02 10*3/uL (ref 0.00–0.07)
Basophils Absolute: 0.1 10*3/uL (ref 0.0–0.1)
Basophils Relative: 1 %
Eosinophils Absolute: 0.3 10*3/uL (ref 0.0–0.5)
Eosinophils Relative: 4 %
HCT: 38.4 % (ref 36.0–46.0)
Hemoglobin: 11.9 g/dL — ABNORMAL LOW (ref 12.0–15.0)
Immature Granulocytes: 0 %
Lymphocytes Relative: 39 %
Lymphs Abs: 2.5 10*3/uL (ref 0.7–4.0)
MCH: 27.6 pg (ref 26.0–34.0)
MCHC: 31 g/dL (ref 30.0–36.0)
MCV: 89.1 fL (ref 80.0–100.0)
Monocytes Absolute: 0.7 10*3/uL (ref 0.1–1.0)
Monocytes Relative: 11 %
Neutro Abs: 2.9 10*3/uL (ref 1.7–7.7)
Neutrophils Relative %: 45 %
Platelets: 315 10*3/uL (ref 150–400)
RBC: 4.31 MIL/uL (ref 3.87–5.11)
RDW: 14.3 % (ref 11.5–15.5)
WBC: 6.4 10*3/uL (ref 4.0–10.5)
nRBC: 0 % (ref 0.0–0.2)

## 2019-07-13 NOTE — ED Triage Notes (Signed)
Patient states that she has not had a menstrual cycle in 11 months.  Patient states that she has had an ongoing heavy menstrual bleeding since July 1.  Patient states that she went to the ER for her vaginal bleeding and had tests and medication given July 23.

## 2019-07-13 NOTE — Discharge Instructions (Addendum)
Follow-up with your OB/GYN as soon as possible.  Rest.  Drink plenty of fluids.  Monitor.  Return to urgent care as needed.  Proceed directly to the emergency room for worsening complaints.

## 2019-07-13 NOTE — ED Provider Notes (Signed)
[facility] ____________________________________________  Time seen: Approximately 3:54 PM  I have reviewed the triage vital signs and the nursing notes.   HISTORY  Chief Complaint Vaginal Bleeding  HPI Carol Ortega is a 48 y.o. female presenting for reevaluation of continued vaginal bleeding.  Patient reports on July 1 she started with heavy dark red vaginal bleeding that has continued.  Did not have a menses for 11 months. Was seen in emergency room 3 days ago for the same complaint with evaluation.  Was given 1 injection of Methergine without resolution of the bleeding.  Denies any acute worsening of symptoms, but reports symptoms have continued.  Patient continues with vaginal bleeding that is bright red, saturating approximately 1 pad every 1-2 hours.  States continues with generalized lower abdominal pelvic cramping consistent with her menstrual bleeding.  Denies dysuria.  Denies other abdominal pain.  Denies fevers.  No cough, chest pain, shortness of breath, dizziness, syncope or near syncope.  Patient does report feels tired.  States her OB/GYN is currently out of town.  OBGYN: in [place]    Past Medical History:  Diagnosis Date  . Anemia   . Anxiety   . Asthma    seasonal  . Benign brain tumor ([place])   . Brain tumor ([place]) 2014   tx with radiation  . Cancer ([place])   . Complication of anesthesia 1999   lung collapse after surgery for gallbladder  . Depression   . Ectopic pregnancy   . Headache    migraines  . Hoarseness of voice   . Hypertension   . Kidney stone   . Obesity   . Parathyroid abnormality ([place])   . Pulmonary emboli ([place]) 2011  . Pulmonary embolism ([place])   . Radiation    Brain tumor  . UTI (urinary tract infection)   . Vitamin D deficiency     Patient Active Problem List   Diagnosis Date Noted  . Insomnia 07/30/2018  . Chronic pain of both knees 07/30/2018  . Morbid obesity due to excess calories ([place]) 05/09/2018  .  Depression, recurrent ([place]) 05/09/2018  . GAD (generalized anxiety disorder) 05/09/2018  . DVT (deep venous thrombosis) ([place]) 04/15/2018  . Bladder spasms 03/12/2018  . Ureteral stone with hydronephrosis 02/28/2018  . Kidney stone 04/09/2017  . H/O pulmonary embolus during pregnancy 08/28/2016  . S/P laparoscopic cholecystectomy 08/28/2016  . Hyperparathyroidism ([place]) 09/10/2015  . Vitamin D deficiency 06/25/2014  . Hypercalcemia 02/09/2014  . Essential hypertension 01/30/2014  . Hyperlipidemia 01/30/2014  . 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 Adult body mass index 50.0-59.9 ([place]) 03/25/2013  . Anxiety state 03/25/2013  . Asthma 03/25/2013  . Obstructive sleep apnea 03/25/2013  . Personal history of venous thrombosis and embolism 03/25/2013  . 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 Brain tumor ([place]) 12/18/2012  . 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 CHOLECYSTECTOMY  1999  . CYSTOSCOPY W/ RETROGRADES Left 03/01/2018   Procedure: CYSTOSCOPY WITH RETROGRADE PYELOGRAM;  Surgeon: Abbie Sons, MD;  Location: [facility];  Service: Urology;  Laterality: Left;  . CYSTOSCOPY W/ URETERAL STENT PLACEMENT Right 04/08/2017   Procedure: CYSTOSCOPY WITH RETROGRADE PYELOGRAM/URETERAL STENT PLACEMENT;  Surgeon: Rana Snare, MD;  Location: [facility];  Service: Urology;  Laterality: Right;  . CYSTOSCOPY W/ URETERAL STENT PLACEMENT Left 03/12/2018   Procedure: CYSTOSCOPY WITH STENT REPLACEMENT;  Surgeon: Abbie Sons, MD;  Location: [facility];  Service: Urology;  Laterality: Left;  .  CYSTOSCOPY W/ URETEROSCOPY    . CYSTOSCOPY WITH RETROGRADE PYELOGRAM, URETEROSCOPY AND STENT PLACEMENT Left 01/20/2014   Procedure: LEFT URETEROSCOPY WITH STENT PLACEMENT;  Surgeon: Irine Seal, MD;  Location: [facility];  Service: Urology;  Laterality: Left;  . CYSTOSCOPY WITH RETROGRADE PYELOGRAM, URETEROSCOPY AND STENT PLACEMENT Bilateral 04/23/2015   Procedure:  CYSTOSCOPY WITH BILATERAL RETROGRADE PYELOGRAM, URETEROSCOPY AND STENT PLACEMENT;  Surgeon: Alexis Frock, MD;  Location: [facility];  Service: Urology;  Laterality: Bilateral;  . CYSTOSCOPY WITH RETROGRADE PYELOGRAM, URETEROSCOPY AND STENT PLACEMENT Right 04/13/2017   Procedure: CYSTOSCOPY WITH RETROGRADE PYELOGRAM, URETEROSCOPY AND STENT EXCHANGE;  Surgeon: Alexis Frock, MD;  Location: [facility];  Service: Urology;  Laterality: Right;  . CYSTOSCOPY WITH STENT PLACEMENT Left 01/12/2014   Procedure: CYSTOSCOPY WITH STENT PLACEMENT left retrograde;  Surgeon: Irine Seal, MD;  Location: [facility];  Service: Urology;  Laterality: Left;  . CYSTOSCOPY WITH STENT PLACEMENT Left 03/01/2018   Procedure: CYSTOSCOPY WITH STENT PLACEMENT;  Surgeon: Abbie Sons, MD;  Location: [facility];  Service: Urology;  Laterality: Left;  . CYSTOSCOPY/URETEROSCOPY/HOLMIUM LASER/STENT PLACEMENT Left 03/12/2018   Procedure: CYSTOSCOPY/URETEROSCOPY/HOLMIUM LASER;  Surgeon: Abbie Sons, MD;  Location: [facility];  Service: Urology;  Laterality: Left;  . HOLMIUM LASER APPLICATION Left 02/22/9023   Procedure: HOLMIUM LASER APPLICATION;  Surgeon: Irine Seal, MD;  Location: [facility];  Service: Urology;  Laterality: Left;  . HOLMIUM LASER APPLICATION Bilateral 0/08/7352   Procedure: HOLMIUM LASER APPLICATION;  Surgeon: Alexis Frock, MD;  Location: [facility];  Service: Urology;  Laterality: Bilateral;  . HOLMIUM LASER APPLICATION Right 2/99/2426   Procedure: HOLMIUM LASER APPLICATION;  Surgeon: Alexis Frock, MD;  Location: [facility];  Service: Urology;  Laterality: Right;  . kidney stone removal    . LITHOTRIPSY    . PARATHYROIDECTOMY Right 11/13/2016   Procedure: RIGHT SUPERIOR PARATHYROIDECTOMY;  Surgeon: Armandina Gemma, MD;  Location: [facility];  Service: General;  Laterality: Right;  . surgery for,ectopic pregnancy  2011    1 fallopian tube rupture  . TUBAL LIGATION  2011      Current Facility-Administered Medications:  .  lidocaine  (XYLOCAINE) 2 % jelly 1 application, 1 application, Urethral, Once, Stoioff, Scott C, MD  Current Outpatient Medications:  .  acetaminophen (TYLENOL) 500 MG tablet, Take 500 mg by mouth every 6 (six) hours as needed., Disp: , Rfl:  .  albuterol (PROVENTIL HFA;VENTOLIN HFA) 108 (90 Base) MCG/ACT inhaler, Inhale 2 puffs into the lungs every 6 (six) hours as needed for wheezing or shortness of breath., Disp: 1 Inhaler, Rfl: 2 .  omeprazole (PRILOSEC) 20 MG capsule, Take 1 capsule (20 mg total) by mouth daily., Disp: 30 capsule, Rfl: 3 .  propranolol (INDERAL) 10 MG tablet, TAKE 1 TABLET BY MOUTH THREE TIMES DAILY AS NEEDED FOR SEVERE ANXIETY SYMPTOMS, Disp: , Rfl:  .  traZODone (DESYREL) 150 MG tablet, Take 1 tablet (150 mg total) by mouth at bedtime., Disp: 30 tablet, Rfl: 1 .  albuterol (PROVENTIL) (2.5 MG/3ML) 0.083% nebulizer solution, Take 3 mLs (2.5 mg total) by nebulization every 6 (six) hours as needed for wheezing or shortness of breath., Disp: 75 mL, Rfl: 12 .  ARIPiprazole (ABILIFY) 10 MG tablet, Take 1 tablet (10 mg total) by mouth daily. For mood, Disp: 30 tablet, Rfl: 0 .  butalbital-acetaminophen-caffeine (FIORICET, ESGIC) 50-325-40 MG tablet, Take 1-2 tablets by mouth every 6 (six) hours as needed for headache., Disp: 20 tablet, Rfl: 0 .  citalopram (CELEXA) 10 MG tablet, Take 1 tablet (  10 mg total) by mouth daily with breakfast. For anxiety, Disp: 30 tablet, Rfl: 1 .  cyclobenzaprine (FLEXERIL) 10 MG tablet, Take 1 tablet (10 mg total) by mouth 3 (three) times daily as needed for muscle spasms. DO NOT DRIVE ON THIS MEDICINE, Disp: 30 tablet, Rfl: 0 .  diclofenac sodium (VOLTAREN) 1 % GEL, Apply 4 g topically 4 (four) times daily., Disp: 100 g, Rfl: 12 .  diphenhydrAMINE (BENADRYL) 25 MG tablet, Take 25 mg by mouth daily as needed for allergies., Disp: , Rfl:  .  hydrOXYzine (VISTARIL) 25 MG capsule, Take 1 capsule (25 mg total) by mouth 2 (two) times daily as needed (severe anxiety  symptoms)., Disp: 60 capsule, Rfl: 1 .  naproxen (NAPROSYN) 500 MG tablet, Take 1 tablet (500 mg total) by mouth 2 (two) times daily with a meal., Disp: 60 tablet, Rfl: 0 .  Vitamin D, Ergocalciferol, (DRISDOL) 50000 units CAPS capsule, Take 1 capsule (50,000 Units total) by mouth every 7 (seven) days., Disp: 12 capsule, Rfl: 1  Allergies Bee venom, Contrast media [iodinated diagnostic agents], Eggs or egg-derived products, Other, Penicillins, Wellbutrin [bupropion], Latex, and Oxycodone-acetaminophen  Family History  Problem Relation Age of Onset  . Bell's palsy Mother   . Heart failure Mother   . Hypertension Mother   . Lupus Mother   . Anxiety disorder Mother   . Depression Mother   . Stroke Mother   . Asthma Father   . Hypertension Father   . Hyperlipidemia Father   . Anxiety disorder Father   . Depression Father   . Hypertension Maternal Grandmother   . Diabetes Maternal Grandmother   . Sarcoidosis Maternal Grandmother   . Anxiety disorder Sister   . Depression Sister   . Bipolar disorder Sister   . Depression Son   . Bipolar disorder Son     Social History Social History   Tobacco Use  . Smoking status: Never Smoker  . Smokeless tobacco: Never Used  Substance Use Topics  . Alcohol use: Not Currently  . Drug use: No    Review of Systems Constitutional: No fever ENT: No sore throat. Cardiovascular: Denies chest pain. Respiratory: Denies shortness of breath. Gastrointestinal:  No nausea, no vomiting.  No diarrhea.  No constipation. Genitourinary: Negative for dysuria. As above.  Skin: Negative for rash.   ____________________________________________   PHYSICAL EXAM:  VITAL SIGNS: ED Triage Vitals  Enc Vitals Group     BP 07/13/19 1516 119/80     Pulse Rate 07/13/19 1516 87     Resp 07/13/19 1516 16     Temp --      Temp Source 07/13/19 1516 Oral     SpO2 07/13/19 1516 98 %     Weight 07/13/19 1512 (!) 339 lb (153.8 kg)     Height 07/13/19 1512 5'  6" (1.676 m)     Head Circumference --      Peak Flow --      Pain Score 07/13/19 1512 8     Pain Loc --      Pain Edu? --      Excl. in [place]? --     Constitutional: Alert and oriented. Well appearing and in no acute distress. Eyes: Conjunctivae are normal.  ENT      Head: Normocephalic and atraumatic. Cardiovascular: Normal rate, regular rhythm. Grossly normal heart sounds.  Good peripheral circulation. Respiratory: Normal respiratory effort without tachypnea nor retractions. Breath sounds are clear and equal bilaterally. No wheezes, rales, rhonchi.  Gastrointestinal: Obese abdomen.  Minimal diffuse lower abdominal tenderness palpation, no guarding.  Abdomen otherwise soft and nontender.  No CVA tenderness. Musculoskeletal: Steady gait. Neurologic:  Normal speech and language. Speech is normal. No gait instability.  Skin:  Skin is warm, dry and intact. No rash noted. Psychiatric: Mood and affect are normal. Speech and behavior are normal. Patient exhibits appropriate insight and judgment   ___________________________________________   LABS (all labs ordered are listed, but only abnormal results are displayed)  Labs Reviewed  CBC WITH DIFFERENTIAL/PLATELET - Abnormal; Notable for the following components:      Result Value   Hemoglobin 11.9 (*)    All other components within normal limits   ____________________________________________  RADIOLOGY  No results found.   EXAM: TRANSABDOMINAL AND TRANSVAGINAL ULTRASOUND OF PELVIS  TECHNIQUE: Both transabdominal and transvaginal ultrasound examinations of the pelvis were performed. Transabdominal technique was performed for global imaging of the pelvis including uterus, ovaries, adnexal regions, and pelvic cul-de-sac. It was necessary to proceed with endovaginal exam following the transabdominal exam to visualize the uterus, endometrium, and ovaries.  COMPARISON:  Prior ultrasound from 10/14/2017  FINDINGS: Uterus   Measurements: 8.8 x 4.5 x 5.5 cm = volume: 113.0 mL. No fibroids or other mass visualized.  Endometrium  Thickness: 8.9 mm.  No focal abnormality visualized.  Right ovary  Not visualized.  No adnexal mass.  Left ovary  Not visualized.  No adnexal mass.  Other findings  No abnormal free fluid.  IMPRESSION: 1. Endometrial stripe measures within normal limits at 8.9 mm in thickness. If bleeding remains unresponsive to hormonal or medical therapy, sonohysterogram should be considered for focal lesion work-up. (Ref: Radiological Reasoning: Algorithmic Workup of Abnormal Vaginal Bleeding with Endovaginal Sonography and Sonohysterography. AJR 2008; [id number]). 2. Otherwise normal sonographic appearance of the uterus. 3. Nonvisualization of the ovaries. No adnexal mass or free fluid within the pelvis.   Electronically Signed   By: Jeannine Boga M.D.   On: 07/10/2019 22:56 ____________________________________________   PROCEDURES Procedures    INITIAL IMPRESSION / ASSESSMENT AND PLAN / ED COURSE  Pertinent labs & imaging results that were available during my care of the patient were reviewed by me and considered in my medical decision making (see chart for details).  Pleasant well-appearing.  Presenting for reevaluation of continued vaginal bleeding.  Patient denies worsening of symptoms.  Recent ER notes reviewed including ultrasound as above.  Patient CBC reviewed and hemodynamically stable.  Recommend for patient to follow-up with OB/GYN.  Patient question regarding oral hormonal therapy to assist with this, however with patient's past medical history of DVT, PE and hypertension and patient currently hemodynamically stable recommend evaluation OB/GYN as soon as possible.  Patient states that she will call her OB/GYN tomorrow.  Instructed to proceed directly to the emergency room for worsening complaints.  Discussed follow up and return parameters  including no resolution or any worsening concerns. Patient verbalized understanding and agreed to plan.   ____________________________________________   FINAL CLINICAL IMPRESSION(S) / ED DIAGNOSES  Final diagnoses:  Vaginal bleeding     ED Discharge Orders    None       Note: This dictation was prepared with Dragon dictation along with smaller phrase technology. Any transcriptional errors that result from this process are unintentional.         Marylene Land, NP 07/13/19 1621

## 2019-08-06 ENCOUNTER — Emergency Department: Payer: Medicaid Other

## 2019-08-06 ENCOUNTER — Other Ambulatory Visit: Payer: Self-pay

## 2019-08-06 ENCOUNTER — Emergency Department
Admission: EM | Admit: 2019-08-06 | Discharge: 2019-08-07 | Disposition: A | Discharge status: Home / Self Care | Payer: Medicaid Other | Attending: Emergency Medicine | Admitting: Emergency Medicine

## 2019-08-06 DIAGNOSIS — Z79899 Other long term (current) drug therapy: Secondary | ICD-10-CM | POA: Diagnosis not present

## 2019-08-06 DIAGNOSIS — I10 Essential (primary) hypertension: Secondary | ICD-10-CM | POA: Insufficient documentation

## 2019-08-06 DIAGNOSIS — R112 Nausea with vomiting, unspecified: Secondary | ICD-10-CM | POA: Diagnosis not present

## 2019-08-06 DIAGNOSIS — N200 Calculus of kidney: Secondary | ICD-10-CM | POA: Diagnosis not present

## 2019-08-06 DIAGNOSIS — R109 Unspecified abdominal pain: Secondary | ICD-10-CM | POA: Diagnosis present

## 2019-08-06 DIAGNOSIS — J45909 Unspecified asthma, uncomplicated: Secondary | ICD-10-CM | POA: Diagnosis not present

## 2019-08-06 DIAGNOSIS — Z91040 Latex allergy status: Secondary | ICD-10-CM | POA: Insufficient documentation

## 2019-08-06 LAB — URINALYSIS, COMPLETE (UACMP) WITH MICROSCOPIC
Bilirubin Urine: NEGATIVE
Glucose, UA: NEGATIVE mg/dL
Hgb urine dipstick: NEGATIVE
Ketones, ur: NEGATIVE mg/dL
Leukocytes,Ua: NEGATIVE
Nitrite: NEGATIVE
Protein, ur: NEGATIVE mg/dL
Specific Gravity, Urine: 1.015 (ref 1.005–1.030)
pH: 7 (ref 5.0–8.0)

## 2019-08-06 LAB — COMPREHENSIVE METABOLIC PANEL
ALT: 12 U/L (ref 0–44)
AST: 15 U/L (ref 15–41)
Albumin: 3.7 g/dL (ref 3.5–5.0)
Alkaline Phosphatase: 69 U/L (ref 38–126)
Anion gap: 4 — ABNORMAL LOW (ref 5–15)
BUN: 8 mg/dL (ref 6–20)
CO2: 30 mmol/L (ref 22–32)
Calcium: 9.2 mg/dL (ref 8.9–10.3)
Chloride: 104 mmol/L (ref 98–111)
Creatinine, Ser: 0.71 mg/dL (ref 0.44–1.00)
GFR calc Af Amer: >60 mL/min (ref >60)
GFR calc non Af Amer: >60 mL/min (ref >60)
Glucose, Bld: 105 mg/dL — ABNORMAL HIGH (ref 70–99)
Potassium: 3.9 mmol/L (ref 3.5–5.1)
Sodium: 138 mmol/L (ref 135–145)
Total Bilirubin: 0.6 mg/dL (ref 0.3–1.2)
Total Protein: 7.5 g/dL (ref 6.5–8.1)

## 2019-08-06 LAB — CBC WITH DIFFERENTIAL/PLATELET
Abs Immature Granulocytes: 0.01 10*3/uL (ref 0.00–0.07)
Basophils Absolute: 0.1 10*3/uL (ref 0.0–0.1)
Basophils Relative: 1 %
Eosinophils Absolute: 0.2 10*3/uL (ref 0.0–0.5)
Eosinophils Relative: 3 %
HCT: 41.3 % (ref 36.0–46.0)
Hemoglobin: 12.4 g/dL (ref 12.0–15.0)
Immature Granulocytes: 0 %
Lymphocytes Relative: 37 %
Lymphs Abs: 2.3 10*3/uL (ref 0.7–4.0)
MCH: 27 pg (ref 26.0–34.0)
MCHC: 30 g/dL (ref 30.0–36.0)
MCV: 89.8 fL (ref 80.0–100.0)
Monocytes Absolute: 0.6 10*3/uL (ref 0.1–1.0)
Monocytes Relative: 10 %
Neutro Abs: 3.1 10*3/uL (ref 1.7–7.7)
Neutrophils Relative %: 49 %
Platelets: 327 10*3/uL (ref 150–400)
RBC: 4.6 MIL/uL (ref 3.87–5.11)
RDW: 14 % (ref 11.5–15.5)
WBC: 6.3 10*3/uL (ref 4.0–10.5)
nRBC: 0 % (ref 0.0–0.2)

## 2019-08-06 LAB — POCT PREGNANCY, URINE: Preg Test, Ur: NEGATIVE

## 2019-08-06 MED ORDER — ONDANSETRON HCL 4 MG/2ML IJ SOLN
4.0000 mg | Freq: Once | INTRAMUSCULAR | Status: AC
  Administered 2019-08-06: 4 mg via INTRAVENOUS
  Filled 2019-08-06: qty 2

## 2019-08-06 MED ORDER — KETOROLAC TROMETHAMINE 30 MG/ML IJ SOLN
30.0000 mg | Freq: Once | INTRAMUSCULAR | Status: AC
  Administered 2019-08-06: 30 mg via INTRAVENOUS
  Filled 2019-08-06: qty 1

## 2019-08-06 MED ORDER — SODIUM CHLORIDE 0.9 % IV BOLUS
1000.0000 mL | Freq: Once | INTRAVENOUS | Status: AC
  Administered 2019-08-06: 1000 mL via INTRAVENOUS

## 2019-08-06 MED ORDER — KETOROLAC TROMETHAMINE 10 MG PO TABS: 10.0000 mg | ORAL_TABLET | Freq: Four times a day (QID) | ORAL | 0 refills | Status: DC | PRN

## 2019-08-06 MED ORDER — OXYCODONE-ACETAMINOPHEN 5-325 MG PO TABS
1.0000 | ORAL_TABLET | ORAL | Status: AC | PRN
  Administered 2019-08-06 – 2019-08-07 (×2): 1 via ORAL
  Filled 2019-08-06 (×2): qty 1

## 2019-08-06 MED ORDER — MORPHINE SULFATE (PF) 4 MG/ML IV SOLN
4.0000 mg | Freq: Once | INTRAVENOUS | Status: AC
  Administered 2019-08-06: 4 mg via INTRAVENOUS
  Filled 2019-08-06: qty 1

## 2019-08-06 MED ORDER — ONDANSETRON 4 MG PO TBDP: 4.0000 mg | ORAL_TABLET | Freq: Three times a day (TID) | ORAL | 0 refills | Status: DC | PRN

## 2019-08-06 NOTE — ED Notes (Addendum)
Patient states she thinks she has a kidney stone. Patient states she gets them often. Last year had stent placed in kidney. Patient states she having pain 10/10

## 2019-08-06 NOTE — ED Provider Notes (Signed)
[facility] Emergency Department Provider Note __   First MD Initiated Contact with Patient 08/06/19 2301     (approximate)  I have reviewed the triage vital signs and the nursing notes.   HISTORY  Chief Complaint Flank Pain   HPI Carol Ortega is a 48 y.o. female with below list of previous medical conditions presents to the emergency department secondary to 10 out of 10 left flank pain with associated nausea and vomiting x3 days.  Patient states that the pain increased in intensity over the past 3 days to its maximum tonight.  Patient denies any fever.  Patient denies any hematuria dysuria.  Patient denies any diarrhea or constipation.         Past Medical History:  Diagnosis Date  . Anemia   . Anxiety   . Asthma    seasonal  . Benign brain tumor ([place])   . Brain tumor ([place]) 2014   tx with radiation  . Cancer ([place])   . Complication of anesthesia 1999   lung collapse after surgery for gallbladder  . Depression   . Ectopic pregnancy   . Headache    migraines  . Hoarseness of voice   . Hypertension   . Kidney stone   . Obesity   . Parathyroid abnormality ([place])   . Pulmonary emboli ([place]) 2011  . Pulmonary embolism ([place])   . Radiation    Brain tumor  . UTI (urinary tract infection)   . Vitamin D deficiency     Patient Active Problem List   Diagnosis Date Noted  . Insomnia 07/30/2018  . Chronic pain of both knees 07/30/2018  . Morbid obesity due to excess calories ([place]) 05/09/2018  . Depression, recurrent ([place]) 05/09/2018  . GAD (generalized anxiety disorder) 05/09/2018  . DVT (deep venous thrombosis) ([place]) 04/15/2018  . Bladder spasms 03/12/2018  . Ureteral stone with hydronephrosis 02/28/2018  . Kidney stone 04/09/2017  . H/O pulmonary embolus during pregnancy 08/28/2016  . S/P laparoscopic cholecystectomy 08/28/2016  . Hyperparathyroidism ([place]) 09/10/2015  . Vitamin D deficiency 06/25/2014  . Hypercalcemia 02/09/2014  .  Essential hypertension 01/30/2014  . Hyperlipidemia 01/30/2014  . 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 Adult body mass index 50.0-59.9 ([place]) 03/25/2013  . Anxiety state 03/25/2013  . Asthma 03/25/2013  . Obstructive sleep apnea 03/25/2013  . Personal history of venous thrombosis and embolism 03/25/2013  . 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 Brain tumor ([place]) 12/18/2012  . 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 CHOLECYSTECTOMY  1999  . CYSTOSCOPY W/ RETROGRADES Left 03/01/2018   Procedure: CYSTOSCOPY WITH RETROGRADE PYELOGRAM;  Surgeon: Abbie Sons, MD;  Location: [facility];  Service: Urology;  Laterality: Left;  . CYSTOSCOPY W/ URETERAL STENT PLACEMENT Right 04/08/2017   Procedure: CYSTOSCOPY WITH RETROGRADE PYELOGRAM/URETERAL STENT PLACEMENT;  Surgeon: Rana Snare, MD;  Location: [facility];  Service: Urology;  Laterality: Right;  . CYSTOSCOPY W/ URETERAL STENT PLACEMENT Left 03/12/2018   Procedure: CYSTOSCOPY WITH STENT REPLACEMENT;  Surgeon: Abbie Sons, MD;  Location: [facility];  Service: Urology;  Laterality: Left;  . CYSTOSCOPY W/ URETEROSCOPY    . CYSTOSCOPY WITH RETROGRADE PYELOGRAM, URETEROSCOPY AND STENT PLACEMENT Left 01/20/2014   Procedure: LEFT URETEROSCOPY WITH STENT PLACEMENT;  Surgeon: Irine Seal, MD;  Location: [facility];  Service: Urology;  Laterality: Left;  . CYSTOSCOPY WITH RETROGRADE PYELOGRAM, URETEROSCOPY AND STENT PLACEMENT Bilateral 04/23/2015   Procedure: CYSTOSCOPY WITH BILATERAL RETROGRADE  PYELOGRAM, URETEROSCOPY AND STENT PLACEMENT;  Surgeon: Alexis Frock, MD;  Location: [facility];  Service: Urology;  Laterality: Bilateral;  . CYSTOSCOPY WITH RETROGRADE PYELOGRAM, URETEROSCOPY AND STENT PLACEMENT Right 04/13/2017   Procedure: CYSTOSCOPY WITH RETROGRADE PYELOGRAM, URETEROSCOPY AND STENT EXCHANGE;  Surgeon: Alexis Frock, MD;  Location: [facility];  Service: Urology;  Laterality: Right;  . CYSTOSCOPY WITH  STENT PLACEMENT Left 01/12/2014   Procedure: CYSTOSCOPY WITH STENT PLACEMENT left retrograde;  Surgeon: Irine Seal, MD;  Location: [facility];  Service: Urology;  Laterality: Left;  . CYSTOSCOPY WITH STENT PLACEMENT Left 03/01/2018   Procedure: CYSTOSCOPY WITH STENT PLACEMENT;  Surgeon: Abbie Sons, MD;  Location: [facility];  Service: Urology;  Laterality: Left;  . CYSTOSCOPY/URETEROSCOPY/HOLMIUM LASER/STENT PLACEMENT Left 03/12/2018   Procedure: CYSTOSCOPY/URETEROSCOPY/HOLMIUM LASER;  Surgeon: Abbie Sons, MD;  Location: [facility];  Service: Urology;  Laterality: Left;  . HOLMIUM LASER APPLICATION Left 03/25/5461   Procedure: HOLMIUM LASER APPLICATION;  Surgeon: Irine Seal, MD;  Location: [facility];  Service: Urology;  Laterality: Left;  . HOLMIUM LASER APPLICATION Bilateral 7/0/3500   Procedure: HOLMIUM LASER APPLICATION;  Surgeon: Alexis Frock, MD;  Location: [facility];  Service: Urology;  Laterality: Bilateral;  . HOLMIUM LASER APPLICATION Right 9/38/1829   Procedure: HOLMIUM LASER APPLICATION;  Surgeon: Alexis Frock, MD;  Location: [facility];  Service: Urology;  Laterality: Right;  . kidney stone removal    . LITHOTRIPSY    . PARATHYROIDECTOMY Right 11/13/2016   Procedure: RIGHT SUPERIOR PARATHYROIDECTOMY;  Surgeon: Armandina Gemma, MD;  Location: [place];  Service: General;  Laterality: Right;  . surgery for,ectopic pregnancy  2011    1 fallopian tube rupture  . TUBAL LIGATION  2011    Prior to Admission medications   Medication Sig Start Date End Date Taking? Authorizing Provider  acetaminophen (TYLENOL) 500 MG tablet Take 500 mg by mouth every 6 (six) hours as needed.    [provider]  albuterol (PROVENTIL HFA;VENTOLIN HFA) 108 (90 Base) MCG/ACT inhaler Inhale 2 puffs into the lungs every 6 (six) hours as needed for wheezing or shortness of breath. 03/04/18   Loletha Grayer, MD  albuterol (PROVENTIL) (2.5 MG/3ML) 0.083% nebulizer solution Take 3 mLs (2.5 mg total) by nebulization  every 6 (six) hours as needed for wheezing or shortness of breath. 03/04/18   Loletha Grayer, MD  ARIPiprazole (ABILIFY) 10 MG tablet Take 1 tablet (10 mg total) by mouth daily. For mood 09/23/18   Ursula Alert, MD  butalbital-acetaminophen-caffeine (FIORICET, ESGIC) 50-325-40 MG tablet Take 1-2 tablets by mouth every 6 (six) hours as needed for headache. 10/02/18 10/02/19  Schaevitz, Randall An, MD  citalopram (CELEXA) 10 MG tablet Take 1 tablet (10 mg total) by mouth daily with breakfast. For anxiety 10/23/18   Ursula Alert, MD  cyclobenzaprine (FLEXERIL) 10 MG tablet Take 1 tablet (10 mg total) by mouth 3 (three) times daily as needed for muscle spasms. DO NOT DRIVE ON THIS MEDICINE 11/27/18   Park Liter P, DO  diclofenac sodium (VOLTAREN) 1 % GEL Apply 4 g topically 4 (four) times daily. 07/30/18   Johnson, Megan P, DO  diphenhydrAMINE (BENADRYL) 25 MG tablet Take 25 mg by mouth daily as needed for allergies.    [provider]  hydrOXYzine (VISTARIL) 25 MG capsule Take 1 capsule (25 mg total) by mouth 2 (two) times daily as needed (severe anxiety symptoms). 10/23/18   Ursula Alert, MD  ketorolac (TORADOL) 10 MG tablet Take 1 tablet (10 mg total) by mouth  every 6 (six) hours as needed. 08/06/19   Gregor Hams, MD  naproxen (NAPROSYN) 500 MG tablet Take 1 tablet (500 mg total) by mouth 2 (two) times daily with a meal. 11/27/18   Johnson, Megan P, DO  omeprazole (PRILOSEC) 20 MG capsule Take 1 capsule (20 mg total) by mouth daily. 09/26/18   Johnson, Megan P, DO  ondansetron (ZOFRAN ODT) 4 MG disintegrating tablet Take 1 tablet (4 mg total) by mouth every 8 (eight) hours as needed. 08/06/19   Gregor Hams, MD  oxyCODONE-acetaminophen (PERCOCET) 5-325 MG tablet Take 1 tablet by mouth every 4 (four) hours as needed. 08/07/19 08/06/20  Gregor Hams, MD  propranolol (INDERAL) 10 MG tablet TAKE 1 TABLET BY MOUTH THREE TIMES DAILY AS NEEDED FOR SEVERE ANXIETY SYMPTOMS  09/11/18   [provider]  traZODone (DESYREL) 150 MG tablet Take 1 tablet (150 mg total) by mouth at bedtime. 09/23/18   Ursula Alert, MD  Vitamin D, Ergocalciferol, (DRISDOL) 50000 units CAPS capsule Take 1 capsule (50,000 Units total) by mouth every 7 (seven) days. 07/31/18   Park Liter P, DO    Allergies Bee venom, Contrast media [iodinated diagnostic agents], Eggs or egg-derived products, Other, Penicillins, Wellbutrin [bupropion], Latex, and Oxycodone-acetaminophen  Family History  Problem Relation Age of Onset  . Bell's palsy Mother   . Heart failure Mother   . Hypertension Mother   . Lupus Mother   . Anxiety disorder Mother   . Depression Mother   . Stroke Mother   . Asthma Father   . Hypertension Father   . Hyperlipidemia Father   . Anxiety disorder Father   . Depression Father   . Hypertension Maternal Grandmother   . Diabetes Maternal Grandmother   . Sarcoidosis Maternal Grandmother   . Anxiety disorder Sister   . Depression Sister   . Bipolar disorder Sister   . Depression Son   . Bipolar disorder Son     Social History Social History   Tobacco Use  . Smoking status: Never Smoker  . Smokeless tobacco: Never Used  Substance Use Topics  . Alcohol use: Not Currently  . Drug use: No    Review of Systems Constitutional: No fever/chills Eyes: No visual changes. ENT: No sore throat. Cardiovascular: Denies chest pain. Respiratory: Denies shortness of breath. Gastrointestinal:   Positive for left flank/abdominal pain, positive for vomiting Genitourinary: Negative for dysuria. Musculoskeletal: Negative for neck pain.  Negative for back pain. Integumentary: Negative for rash. Neurological: Negative for headaches, focal weakness or numbness.   ____________________________________________   PHYSICAL EXAM:  VITAL SIGNS: ED Triage Vitals  Enc Vitals Group     BP 08/06/19 1907 (!) 116/93     Pulse Rate 08/06/19 1907 84     Resp 08/06/19 1907  18     Temp 08/06/19 1907 98.4 F (36.9 C)     Temp Source 08/06/19 1907 Oral     SpO2 08/06/19 1907 97 %     Weight 08/06/19 1902 (!) 154.2 kg (340 lb)     Height 08/06/19 1902 1.676 m (5\' 6" )     Head Circumference --      Peak Flow --      Pain Score 08/06/19 1902 10     Pain Loc --      Pain Edu? --      Excl. in [place]? --     Constitutional: Alert and oriented.  Eyes: Conjunctivae are normal.  Mouth/Throat: Mucous membranes are moist. Neck:  No stridor.  No meningeal signs.   Cardiovascular: Normal rate, regular rhythm. Good peripheral circulation. Grossly normal heart sounds. Respiratory: Normal respiratory effort.  No retractions. Gastrointestinal: Soft and nontender. No distention.  Musculoskeletal: No lower extremity tenderness nor edema. No gross deformities of extremities. Neurologic:  Normal speech and language. No gross focal neurologic deficits are appreciated.  Skin:  Skin is warm, dry and intact. Psychiatric: Mood and affect are normal. Speech and behavior are normal.  ____________________________________________   LABS (all labs ordered are listed, but only abnormal results are displayed)  Labs Reviewed  URINALYSIS, COMPLETE (UACMP) WITH MICROSCOPIC - Abnormal; Notable for the following components:      Result Value   Color, Urine YELLOW (*)    APPearance CLEAR (*)    Bacteria, UA RARE (*)    All other components within normal limits  COMPREHENSIVE METABOLIC PANEL - Abnormal; Notable for the following components:   Glucose, Bld 105 (*)    Anion gap 4 (*)    All other components within normal limits  CBC WITH DIFFERENTIAL/PLATELET  POC URINE PREG, ED  POCT PREGNANCY, URINE    RADIOLOGY I, Carol Ortega, personally viewed and evaluated these images (plain radiographs) as part of my medical decision making, as well as reviewing the written report by the radiologist.  ED MD interpretation: Small stone left kidney per radiologist.  Official radiology  report(s): No results found.  ____________________________________________   Procedures   ____________________________________________   INITIAL IMPRESSION / MDM / [place] / ED COURSE  As part of my medical decision making, I reviewed the following data within the electronic MEDICAL RECORD NUMBER  48 year old female presented with above-stated history and physical exam secondary to left flank pain.  CT scan revealed a small nephrolithiasis per radiologist on CT scan interpretation.  Patient given Toradol morphine the emergency department with improvement of pain.  Patient will be prescribed Toradol and Percocet for home with referral to urologist.      ____________________________________________  FINAL CLINICAL IMPRESSION(S) / ED DIAGNOSES  Final diagnoses:  Kidney stone on left side     MEDICATIONS GIVEN DURING THIS VISIT:  Medications  oxyCODONE-acetaminophen (PERCOCET/ROXICET) 5-325 MG per tablet 1 tablet (1 tablet Oral Given 08/06/19 1912)  ketorolac (TORADOL) 30 MG/ML injection 30 mg (30 mg Intravenous Given 08/06/19 2328)  morphine 4 MG/ML injection 4 mg (4 mg Intravenous Given 08/06/19 2330)  sodium chloride 0.9 % bolus 1,000 mL (0 mLs Intravenous Stopped 08/07/19 0037)  ondansetron (ZOFRAN) injection 4 mg (4 mg Intravenous Given 08/06/19 2328)     ED Discharge Orders         Ordered    oxyCODONE-acetaminophen (PERCOCET) 5-325 MG tablet  Every 4 hours PRN     08/07/19 0100    ketorolac (TORADOL) 10 MG tablet  Every 6 hours PRN     08/06/19 2349    ondansetron (ZOFRAN ODT) 4 MG disintegrating tablet  Every 8 hours PRN     08/06/19 2349          *Please note:  Carol Ortega was evaluated in Emergency Department on 08/07/2019 for the symptoms described in the history of present illness. She was evaluated in the context of the global COVID-19 pandemic, which necessitated consideration that the patient might be at risk for infection with the SARS-CoV-2  virus that causes COVID-19. Institutional protocols and algorithms that pertain to the evaluation of patients at risk for COVID-19 are in a state of rapid change based on information  released by regulatory bodies including the CDC and federal and state organizations. These policies and algorithms were followed during the patient's care in the ED.  Some ED evaluations and interventions may be delayed as a result of limited staffing during the pandemic.*  Note:  This document was prepared using Dragon voice recognition software and may include unintentional dictation errors.   Gregor Hams, MD 08/07/19 [phone]

## 2019-08-06 NOTE — ED Notes (Signed)
Pt reports her only reaction to Percocet is itching and states she's willing to "itch for a little while if the medication with help with the pain". Pt instructed to notify staff immediately for any s/x's of an allergic reaction, such as CP, SHOB, or trouble breathing or swallowing; pt verbalizes acknowledgment.

## 2019-08-06 NOTE — ED Triage Notes (Signed)
First RN Note: Pt presents to ED via POV with c/o flank pain, pt states hx of hypercalcemia and as a result has calcium kidney stones. Pt appears uncomfortable at desk at this time, but is ambulatory, refused wheelchair.

## 2019-08-06 NOTE — ED Triage Notes (Signed)
Pt presents to ED via POV from home with c/o left flank pain x3 days with increasing intensity and radiation into the abdominal LLQ. Pt reports multiple issues with kidney stones in the past. Pt states decrease in urinary frequency, as well as several episode of N/V over the last 2 days. Pt denies any c/o CP or SHOB.

## 2019-08-07 MED ORDER — OXYCODONE-ACETAMINOPHEN 5-325 MG PO TABS: 1.0000 | ORAL_TABLET | ORAL | 0 refills | Status: DC | PRN

## 2019-08-07 NOTE — ED Notes (Signed)
Patient unable to sign due to signature pad not working

## 2019-09-18 ENCOUNTER — Ambulatory Visit: Payer: Medicaid Other | Admitting: Neurology

## 2019-10-29 ENCOUNTER — Ambulatory Visit: Payer: Medicaid Other | Admitting: Neurology

## 2019-10-30 DIAGNOSIS — N921 Excessive and frequent menstruation with irregular cycle: Secondary | ICD-10-CM | POA: Insufficient documentation

## 2020-02-23 DIAGNOSIS — G5131 Clonic hemifacial spasm, right: Secondary | ICD-10-CM | POA: Insufficient documentation

## 2020-03-15 ENCOUNTER — Encounter: Payer: Self-pay | Admitting: Emergency Medicine

## 2020-03-15 DIAGNOSIS — Z79899 Other long term (current) drug therapy: Secondary | ICD-10-CM | POA: Diagnosis not present

## 2020-03-15 DIAGNOSIS — I10 Essential (primary) hypertension: Secondary | ICD-10-CM | POA: Insufficient documentation

## 2020-03-15 DIAGNOSIS — R109 Unspecified abdominal pain: Secondary | ICD-10-CM | POA: Diagnosis present

## 2020-03-15 DIAGNOSIS — N200 Calculus of kidney: Secondary | ICD-10-CM | POA: Diagnosis not present

## 2020-03-15 DIAGNOSIS — Z85841 Personal history of malignant neoplasm of brain: Secondary | ICD-10-CM | POA: Insufficient documentation

## 2020-03-15 LAB — COMPREHENSIVE METABOLIC PANEL
ALT: 14 U/L (ref 0–44)
AST: 16 U/L (ref 15–41)
Albumin: 3.5 g/dL (ref 3.5–5.0)
Alkaline Phosphatase: 72 U/L (ref 38–126)
Anion gap: 9 (ref 5–15)
BUN: 9 mg/dL (ref 6–20)
CO2: 28 mmol/L (ref 22–32)
Calcium: 9.3 mg/dL (ref 8.9–10.3)
Chloride: 101 mmol/L (ref 98–111)
Creatinine, Ser: 0.87 mg/dL (ref 0.44–1.00)
GFR calc Af Amer: >60 mL/min (ref >60)
GFR calc non Af Amer: >60 mL/min (ref >60)
Glucose, Bld: 119 mg/dL — ABNORMAL HIGH (ref 70–99)
Potassium: 3.8 mmol/L (ref 3.5–5.1)
Sodium: 138 mmol/L (ref 135–145)
Total Bilirubin: 0.4 mg/dL (ref 0.3–1.2)
Total Protein: 7.6 g/dL (ref 6.5–8.1)

## 2020-03-15 LAB — URINALYSIS, COMPLETE (UACMP) WITH MICROSCOPIC
Bilirubin Urine: NEGATIVE
Glucose, UA: NEGATIVE mg/dL
Hgb urine dipstick: NEGATIVE
Ketones, ur: NEGATIVE mg/dL
Leukocytes,Ua: NEGATIVE
Nitrite: NEGATIVE
Protein, ur: NEGATIVE mg/dL
Specific Gravity, Urine: 1.018 (ref 1.005–1.030)
pH: 5 (ref 5.0–8.0)

## 2020-03-15 LAB — CBC
HCT: 38.9 % (ref 36.0–46.0)
Hemoglobin: 11.8 g/dL — ABNORMAL LOW (ref 12.0–15.0)
MCH: 25.8 pg — ABNORMAL LOW (ref 26.0–34.0)
MCHC: 30.3 g/dL (ref 30.0–36.0)
MCV: 84.9 fL (ref 80.0–100.0)
Platelets: 357 10*3/uL (ref 150–400)
RBC: 4.58 MIL/uL (ref 3.87–5.11)
RDW: 16.3 % — ABNORMAL HIGH (ref 11.5–15.5)
WBC: 7 10*3/uL (ref 4.0–10.5)
nRBC: 0 % (ref 0.0–0.2)

## 2020-03-15 LAB — POCT PREGNANCY, URINE: Preg Test, Ur: NEGATIVE

## 2020-03-15 LAB — LIPASE, BLOOD: Lipase: 19 U/L (ref 11–51)

## 2020-03-15 NOTE — ED Triage Notes (Signed)
Pt reports left flank pain since Valentines day and in last 24hours pain has increased and become sharp. Pt with know kidney stones. Pt denies pcp or urologist.

## 2020-03-16 ENCOUNTER — Emergency Department
Admission: EM | Admit: 2020-03-16 | Discharge: 2020-03-16 | Disposition: A | Discharge status: Home / Self Care | Payer: Medicaid Other | Attending: Emergency Medicine | Admitting: Emergency Medicine

## 2020-03-16 ENCOUNTER — Emergency Department: Payer: Medicaid Other

## 2020-03-16 DIAGNOSIS — N200 Calculus of kidney: Secondary | ICD-10-CM

## 2020-03-16 DIAGNOSIS — R109 Unspecified abdominal pain: Secondary | ICD-10-CM

## 2020-03-16 MED ORDER — SODIUM CHLORIDE 0.9 % IV BOLUS
1000.0000 mL | Freq: Once | INTRAVENOUS | Status: AC
  Administered 2020-03-16: 1000 mL via INTRAVENOUS

## 2020-03-16 MED ORDER — OXYCODONE-ACETAMINOPHEN 5-325 MG PO TABS: 1.0000 | ORAL_TABLET | ORAL | 0 refills | Status: DC | PRN

## 2020-03-16 MED ORDER — FENTANYL CITRATE (PF) 100 MCG/2ML IJ SOLN
50.0000 ug | Freq: Once | INTRAMUSCULAR | Status: AC
  Administered 2020-03-16: 50 ug via INTRAVENOUS
  Filled 2020-03-16: qty 2

## 2020-03-16 MED ORDER — DIPHENHYDRAMINE HCL 25 MG PO CAPS
25.0000 mg | ORAL_CAPSULE | Freq: Once | ORAL | Status: AC
  Administered 2020-03-16: 25 mg via ORAL
  Filled 2020-03-16: qty 1

## 2020-03-16 MED ORDER — ONDANSETRON HCL 4 MG/2ML IJ SOLN
4.0000 mg | Freq: Once | INTRAMUSCULAR | Status: AC
  Administered 2020-03-16: 4 mg via INTRAVENOUS
  Filled 2020-03-16: qty 2

## 2020-03-16 MED ORDER — ONDANSETRON 4 MG PO TBDP: 4.0000 mg | ORAL_TABLET | Freq: Three times a day (TID) | ORAL | 0 refills | Status: DC | PRN

## 2020-03-16 MED ORDER — OXYCODONE-ACETAMINOPHEN 5-325 MG PO TABS
1.0000 | ORAL_TABLET | Freq: Once | ORAL | Status: AC
  Administered 2020-03-16: 1 via ORAL
  Filled 2020-03-16: qty 1

## 2020-03-16 NOTE — ED Provider Notes (Signed)
[facility] Emergency Department Provider Note   ____________________________________________   First MD Initiated Contact with Patient 03/16/20 0139     (approximate)  I have reviewed the triage vital signs and the nursing notes.   HISTORY  Chief Complaint Flank Pain    HPI Carol Ortega is a 49 y.o. female who presents to the ED from home with a chief complaint of left flank pain.  Patient reports intermittent left flank pain since 2/14.  Increase in the past 24 hours and now associated with nausea and vomiting.  Patient has a complex history of kidney stones requiring stenting.  Denies fever, cough, chest pain, shortness of breath, abdominal pain, hematuria.  Patient tested Covid positive in late January; had mild acute illness and currently does not exhibit cough.       Past Medical History:  Diagnosis Date  . Anemia   . Anxiety   . Asthma    seasonal  . Benign brain tumor ([place])   . Brain tumor ([place]) 2014   tx with radiation  . Cancer ([place])   . Complication of anesthesia 1999   lung collapse after surgery for gallbladder  . Depression   . Ectopic pregnancy   . Headache    migraines  . Hoarseness of voice   . Hypertension   . Kidney stone   . Obesity   . Parathyroid abnormality ([place])   . Pulmonary emboli ([place]) 2011  . Pulmonary embolism ([place])   . Radiation    Brain tumor  . UTI (urinary tract infection)   . Vitamin D deficiency     Patient Active Problem List   Diagnosis Date Noted  . Insomnia 07/30/2018  . Chronic pain of both knees 07/30/2018  . Morbid obesity due to excess calories ([place]) 05/09/2018  . Depression, recurrent ([place]) 05/09/2018  . GAD (generalized anxiety disorder) 05/09/2018  . DVT (deep venous thrombosis) ([place]) 04/15/2018  . Bladder spasms 03/12/2018  . Ureteral stone with hydronephrosis 02/28/2018  . Kidney stone 04/09/2017  . H/O pulmonary embolus during pregnancy 08/28/2016  . S/P laparoscopic  cholecystectomy 08/28/2016  . Hyperparathyroidism ([place]) 09/10/2015  . Vitamin D deficiency 06/25/2014  . Hypercalcemia 02/09/2014  . Essential hypertension 01/30/2014  . Hyperlipidemia 01/30/2014  . 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 Adult body mass index 50.0-59.9 ([place]) 03/25/2013  . Anxiety state 03/25/2013  . Asthma 03/25/2013  . Obstructive sleep apnea 03/25/2013  . Personal history of venous thrombosis and embolism 03/25/2013  . 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 Brain tumor ([place]) 12/18/2012  . 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 CHOLECYSTECTOMY  1999  . CYSTOSCOPY W/ RETROGRADES Left 03/01/2018   Procedure: CYSTOSCOPY WITH RETROGRADE PYELOGRAM;  Surgeon: Abbie Sons, MD;  Location: [facility];  Service: Urology;  Laterality: Left;  . CYSTOSCOPY W/ URETERAL STENT PLACEMENT Right 04/08/2017   Procedure: CYSTOSCOPY WITH RETROGRADE PYELOGRAM/URETERAL STENT PLACEMENT;  Surgeon: Rana Snare, MD;  Location: [facility];  Service: Urology;  Laterality: Right;  . CYSTOSCOPY W/ URETERAL STENT PLACEMENT Left 03/12/2018   Procedure: CYSTOSCOPY WITH STENT REPLACEMENT;  Surgeon: Abbie Sons, MD;  Location: [facility];  Service: Urology;  Laterality: Left;  . CYSTOSCOPY W/ URETEROSCOPY    . CYSTOSCOPY WITH RETROGRADE PYELOGRAM, URETEROSCOPY AND STENT PLACEMENT Left 01/20/2014   Procedure: LEFT URETEROSCOPY WITH STENT PLACEMENT;  Surgeon: Irine Seal, MD;  Location: [facility];  Service: Urology;  Laterality: Left;  . CYSTOSCOPY  WITH RETROGRADE PYELOGRAM, URETEROSCOPY AND STENT PLACEMENT Bilateral 04/23/2015   Procedure: CYSTOSCOPY WITH BILATERAL RETROGRADE PYELOGRAM, URETEROSCOPY AND STENT PLACEMENT;  Surgeon: Alexis Frock, MD;  Location: [facility];  Service: Urology;  Laterality: Bilateral;  . CYSTOSCOPY WITH RETROGRADE PYELOGRAM, URETEROSCOPY AND STENT PLACEMENT Right 04/13/2017   Procedure: CYSTOSCOPY WITH RETROGRADE PYELOGRAM,  URETEROSCOPY AND STENT EXCHANGE;  Surgeon: Alexis Frock, MD;  Location: [facility];  Service: Urology;  Laterality: Right;  . CYSTOSCOPY WITH STENT PLACEMENT Left 01/12/2014   Procedure: CYSTOSCOPY WITH STENT PLACEMENT left retrograde;  Surgeon: Irine Seal, MD;  Location[facility];  Service: Urology;  Laterality: Left;  . CYSTOSCOPY WITH STENT PLACEMENT Left 03/01/2018   Procedure: CYSTOSCOPY WITH STENT PLACEMENT;  Surgeon: Abbie Sons, MD;  Location: [facility];  Service: Urology;  Laterality: Left;  . CYSTOSCOPY/URETEROSCOPY/HOLMIUM LASER/STENT PLACEMENT Left 03/12/2018   Procedure: CYSTOSCOPY/URETEROSCOPY/HOLMIUM LASER;  Surgeon: Abbie Sons, MD;  Location: [facility];  Service: Urology;  Laterality: Left;  . HOLMIUM LASER APPLICATION Left [id number]   Procedure: HOLMIUM LASER APPLICATION;  Surgeon: Irine Seal, MD;  [facility];  Service: Urology;  Laterality: Left;  . HOLMIUM LASER APPLICATION Bilateral [id number]   Procedure: HOLMIUM LASER APPLICATION;  Surgeon: Alexis Frock, MD;  [facility];  Service: Urology;  Laterality: Bilateral;  . HOLMIUM LASER APPLICATION Right [id number]   Procedure: HOLMIUM LASER APPLICATION;  Surgeon: Alexis Frock, MD;  Location: [facility];  Service: Urology;  Laterality: Right;  . kidney stone removal    . LITHOTRIPSY    . PARATHYROIDECTOMY Right 11/13/2016   Procedure: RIGHT SUPERIOR PARATHYROIDECTOMY;  Surgeon: Armandina Gemma, MD;  Location: [facility];  Service: General;  Laterality: Right;  . surgery for,ectopic pregnancy  2011    1 fallopian tube rupture  . TUBAL LIGATION  2011    Prior to Admission medications   Medication Sig Start Date End Date Taking? Authorizing Provider  acetaminophen (TYLENOL) 500 MG tablet Take 500 mg by mouth every 6 (six) hours as needed.    [provider]  albuterol (PROVENTIL HFA;VENTOLIN HFA) 108 (90 Base) MCG/ACT inhaler Inhale 2 puffs into the lungs every 6 (six) hours as needed for wheezing or shortness of  breath. 03/04/18   Loletha Grayer, MD  albuterol (PROVENTIL) (2.5 MG/3ML) 0.083% nebulizer solution Take 3 mLs (2.5 mg total) by nebulization every 6 (six) hours as needed for wheezing or shortness of breath. 03/04/18   Loletha Grayer, MD  ARIPiprazole (ABILIFY) 10 MG tablet Take 1 tablet (10 mg total) by mouth daily. For mood 09/23/18   Ursula Alert, MD  citalopram (CELEXA) 10 MG tablet Take 1 tablet (10 mg total) by mouth daily with breakfast. For anxiety 10/23/18   Ursula Alert, MD  cyclobenzaprine (FLEXERIL) 10 MG tablet Take 1 tablet (10 mg total) by mouth 3 (three) times daily as needed for muscle spasms. DO NOT DRIVE ON THIS MEDICINE 11/27/18   Park Liter P, DO  diclofenac sodium (VOLTAREN) 1 % GEL Apply 4 g topically 4 (four) times daily. 07/30/18   Johnson, Megan P, DO  diphenhydrAMINE (BENADRYL) 25 MG tablet Take 25 mg by mouth daily as needed for allergies.    [provider]  hydrOXYzine (VISTARIL) 25 MG capsule Take 1 capsule (25 mg total) by mouth 2 (two) times daily as needed (severe anxiety symptoms). 10/23/18   Ursula Alert, MD  ketorolac (TORADOL) 10 MG tablet Take 1 tablet (10 mg total) by mouth every 6 (six) hours as needed. 08/06/19   Marjean Donna  N, MD  naproxen (NAPROSYN) 500 MG tablet Take 1 tablet (500 mg total) by mouth 2 (two) times daily with a meal. 11/27/18   Johnson, Megan P, DO  omeprazole (PRILOSEC) 20 MG capsule Take 1 capsule (20 mg total) by mouth daily. 09/26/18   Johnson, Megan P, DO  ondansetron (ZOFRAN ODT) 4 MG disintegrating tablet Take 1 tablet (4 mg total) by mouth every 8 (eight) hours as needed for nausea or vomiting. 03/16/20   Paulette Blanch, MD  oxyCODONE-acetaminophen (PERCOCET/ROXICET) 5-325 MG tablet Take 1 tablet by mouth every 4 (four) hours as needed for severe pain. 03/16/20   Paulette Blanch, MD  propranolol (INDERAL) 10 MG tablet TAKE 1 TABLET BY MOUTH THREE TIMES DAILY AS NEEDED FOR SEVERE ANXIETY SYMPTOMS 09/11/18   [provider]  traZODone (DESYREL) 150 MG tablet Take 1 tablet (150 mg total) by mouth at bedtime. 09/23/18   Ursula Alert, MD  Vitamin D, Ergocalciferol, (DRISDOL) 50000 units CAPS capsule Take 1 capsule (50,000 Units total) by mouth every 7 (seven) days. 07/31/18   Park Liter P, DO    Allergies Bee venom, Contrast media [iodinated diagnostic agents], Eggs or egg-derived products, Other, Penicillins, Wellbutrin [bupropion], Latex, and Oxycodone-acetaminophen  Family History  Problem Relation Age of Onset  . Bell's palsy Mother   . Heart failure Mother   . Hypertension Mother   . Lupus Mother   . Anxiety disorder Mother   . Depression Mother   . Stroke Mother   . Asthma Father   . Hypertension Father   . Hyperlipidemia Father   . Anxiety disorder Father   . Depression Father   . Hypertension Maternal Grandmother   . Diabetes Maternal Grandmother   . Sarcoidosis Maternal Grandmother   . Anxiety disorder Sister   . Depression Sister   . Bipolar disorder Sister   . Depression Son   . Bipolar disorder Son     Social History Social History   Tobacco Use  . Smoking status: Never Smoker  . Smokeless tobacco: Never Used  Substance Use Topics  . Alcohol use: Not Currently  . Drug use: No    Review of Systems  Constitutional: No fever/chills Eyes: No visual changes. ENT: No sore throat. Cardiovascular: Denies chest pain. Respiratory: Denies shortness of breath. Gastrointestinal: No abdominal pain.  Positive for left flank pain, nausea and vomiting.  No diarrhea.  No constipation. Genitourinary: Negative for dysuria. Musculoskeletal: Negative for back pain. Skin: Negative for rash. Neurological: Negative for headaches, focal weakness or numbness.   ____________________________________________   PHYSICAL EXAM:  VITAL SIGNS: ED Triage Vitals [03/15/20 2246]  Enc Vitals Group     BP (!) 161/99     Pulse Rate 79     Resp 17     Temp 98.6 F (37 C)     Temp  Source Oral     SpO2 99 %     Weight      Height      Head Circumference      Peak Flow      Pain Score      Pain Loc      Pain Edu?      Excl. in [place]?     Constitutional: Alert and oriented. Well appearing and in no acute distress. Eyes: Conjunctivae are normal. PERRL. EOMI. Head: Atraumatic. Nose: No congestion/rhinnorhea. Mouth/Throat: Mucous membranes are moist.  Oropharynx non-erythematous. Neck: No stridor.   Cardiovascular: Normal rate, regular rhythm. Grossly normal heart sounds.  Good peripheral circulation. Respiratory: Normal respiratory effort.  No retractions. Lungs CTAB. Gastrointestinal: Obese.  Soft and nontender to D palpation. No distention. No abdominal bruits.  Mild left CVA tenderness. Musculoskeletal: No lower extremity tenderness nor edema.  No joint effusions. Neurologic:  Normal speech and language. No gross focal neurologic deficits are appreciated. No gait instability. Skin:  Skin is warm, dry and intact. No rash noted.  No vesicles. Psychiatric: Mood and affect are normal. Speech and behavior are normal.  ____________________________________________   LABS (all labs ordered are listed, but only abnormal results are displayed)  Labs Reviewed  COMPREHENSIVE METABOLIC PANEL - Abnormal; Notable for the following components:      Result Value   Glucose, Bld 119 (*)    All other components within normal limits  CBC - Abnormal; Notable for the following components:   Hemoglobin 11.8 (*)    MCH 25.8 (*)    RDW 16.3 (*)    All other components within normal limits  URINALYSIS, COMPLETE (UACMP) WITH MICROSCOPIC - Abnormal; Notable for the following components:   Color, Urine YELLOW (*)    APPearance HAZY (*)    Bacteria, UA RARE (*)    All other components within normal limits  LIPASE, BLOOD  POC URINE PREG, ED  POCT PREGNANCY, URINE    ____________________________________________  EKG  None ____________________________________________  RADIOLOGY  ED MD interpretation: Nonobstructing calculi bilaterally  Official radiology report(s): CT Renal Stone Study  Result Date: 03/16/2020 CLINICAL DATA:  Left-sided flank pain for several weeks EXAM: CT ABDOMEN AND PELVIS WITHOUT CONTRAST TECHNIQUE: Multidetector CT imaging of the abdomen and pelvis was performed following the standard protocol without IV contrast. COMPARISON:  08/06/2019 FINDINGS: Lower chest: No acute abnormality. Hepatobiliary: No focal liver abnormality is seen. Status post cholecystectomy. No biliary dilatation. Pancreas: Unremarkable. No pancreatic ductal dilatation or surrounding inflammatory changes. Spleen: Normal in size without focal abnormality. Adrenals/Urinary Tract: Adrenal glands are within normal limits. Kidneys are well visualized bilaterally. Right kidney is well visualized without obstructive change. Tiny 1-2 mm nonobstructing stone is noted in the lower pole. Hypodensity is noted posteriorly consistent with a focal cyst. No obstructive changes are seen. The left kidney demonstrates a 5 mm nonobstructing stone. Bladder is partially distended. Stomach/Bowel: Scattered diverticular changes noted without evidence of diverticulitis. The appendix is within normal limits. No small bowel abnormality is seen. The stomach is decompressed. Vascular/Lymphatic: Aortic atherosclerosis. No enlarged abdominal or pelvic lymph nodes. Reproductive: Uterus and bilateral adnexa are unremarkable. Other: No abdominal wall hernia or abnormality. No abdominopelvic ascites. Musculoskeletal: No acute or significant osseous findings. IMPRESSION: Nonobstructing calculi bilaterally as described above. No acute abnormality noted. Electronically Signed   By: Inez Catalina M.D.   On: 03/16/2020 02:38    ____________________________________________   PROCEDURES  Procedure(s)  performed (including Critical Care):  Procedures   ____________________________________________   INITIAL IMPRESSION / ASSESSMENT AND PLAN / ED COURSE  As part of my medical decision making, I reviewed the following data within the [place] notes reviewed and incorporated, Labs reviewed, Old chart reviewed, Radiograph reviewed, Notes from prior ED visits and [place] Controlled Substance Database     Joellen Yamane was evaluated in Emergency Department on 03/16/2020 for the symptoms described in the history of present illness. She was evaluated in the context of the global COVID-19 pandemic, which necessitated consideration that the patient might be at risk for infection with the SARS-CoV-2 virus that causes COVID-19. Institutional protocols and algorithms that pertain to  the evaluation of patients at risk for COVID-19 are in a state of rapid change based on information released by regulatory bodies including the CDC and federal and state organizations. These policies and algorithms were followed during the patient's care in the ED.    49 year old female with history of kidney stones presenting with left flank pain x6 weeks. Differential diagnosis includes, but is not limited to, ovarian cyst, ovarian torsion, acute appendicitis, diverticulitis, urinary tract infection/pyelonephritis, endometriosis, bowel obstruction, colitis, renal colic, gastroenteritis, hernia, fibroids, endometriosis, pregnancy related pain including ectopic pregnancy, etc.  Laboratory and urine results unremarkable.  Will initiate IV fluid resuscitation, IV fentanyl for pain paired with IV Zofran for nausea.  Obtain CT renal colic study.   Clinical Course as of Mar 16 253  Tue Mar 16, 2020  0247 Patient resting in no acute distress.  Updated her on CT results.  Will discharge home with NSAIDs, analgesia and patient will follow up with urology as needed.  Strict return precautions given.  Patient  verbalizes understanding agrees with plan of care.   [JS]    Clinical Course User Index [JS] Paulette Blanch, MD     ____________________________________________   FINAL CLINICAL IMPRESSION(S) / ED DIAGNOSES  Final diagnoses:  Left flank pain  Kidney stone     ED Discharge Orders         Ordered    oxyCODONE-acetaminophen (PERCOCET/ROXICET) 5-325 MG tablet  Every 4 hours PRN     03/16/20 0253    ondansetron (ZOFRAN ODT) 4 MG disintegrating tablet  Every 8 hours PRN     03/16/20 0253           Note:  This document was prepared using Dragon voice recognition software and may include unintentional dictation errors.   Paulette Blanch, MD 03/16/20 [phone]

## 2020-03-16 NOTE — Discharge Instructions (Addendum)
1.  Take Ibuprofen as needed for pain, Percocet as needed for more severe pain. 2.  You may take Zofran as needed for nausea. 3.  Drink plenty of fluids daily. 4.  Return to the ER for worsening symptoms, persistent vomiting, difficulty breathing or other concerns.

## 2020-03-26 ENCOUNTER — Emergency Department: Payer: Medicaid Other

## 2020-03-26 ENCOUNTER — Emergency Department
Admission: EM | Admit: 2020-03-26 | Discharge: 2020-03-26 | Disposition: A | Discharge status: Home / Self Care | Payer: Medicaid Other | Attending: Emergency Medicine | Admitting: Emergency Medicine

## 2020-03-26 ENCOUNTER — Other Ambulatory Visit: Payer: Self-pay

## 2020-03-26 DIAGNOSIS — Z91040 Latex allergy status: Secondary | ICD-10-CM | POA: Diagnosis not present

## 2020-03-26 DIAGNOSIS — R109 Unspecified abdominal pain: Secondary | ICD-10-CM | POA: Diagnosis not present

## 2020-03-26 DIAGNOSIS — R112 Nausea with vomiting, unspecified: Secondary | ICD-10-CM | POA: Diagnosis not present

## 2020-03-26 DIAGNOSIS — Z79899 Other long term (current) drug therapy: Secondary | ICD-10-CM | POA: Insufficient documentation

## 2020-03-26 DIAGNOSIS — Z859 Personal history of malignant neoplasm, unspecified: Secondary | ICD-10-CM | POA: Diagnosis not present

## 2020-03-26 DIAGNOSIS — J45909 Unspecified asthma, uncomplicated: Secondary | ICD-10-CM | POA: Insufficient documentation

## 2020-03-26 LAB — COMPREHENSIVE METABOLIC PANEL
ALT: 12 U/L (ref 0–44)
AST: 17 U/L (ref 15–41)
Albumin: 3.9 g/dL (ref 3.5–5.0)
Alkaline Phosphatase: 69 U/L (ref 38–126)
Anion gap: 10 (ref 5–15)
BUN: 12 mg/dL (ref 6–20)
CO2: 25 mmol/L (ref 22–32)
Calcium: 9.6 mg/dL (ref 8.9–10.3)
Chloride: 104 mmol/L (ref 98–111)
Creatinine, Ser: 0.9 mg/dL (ref 0.44–1.00)
GFR calc Af Amer: >60 mL/min (ref >60)
GFR calc non Af Amer: >60 mL/min (ref >60)
Glucose, Bld: 127 mg/dL — ABNORMAL HIGH (ref 70–99)
Potassium: 4.1 mmol/L (ref 3.5–5.1)
Sodium: 139 mmol/L (ref 135–145)
Total Bilirubin: 0.7 mg/dL (ref 0.3–1.2)
Total Protein: 8.2 g/dL — ABNORMAL HIGH (ref 6.5–8.1)

## 2020-03-26 LAB — CBC
HCT: 40.7 % (ref 36.0–46.0)
Hemoglobin: 12.5 g/dL (ref 12.0–15.0)
MCH: 25.8 pg — ABNORMAL LOW (ref 26.0–34.0)
MCHC: 30.7 g/dL (ref 30.0–36.0)
MCV: 83.9 fL (ref 80.0–100.0)
Platelets: 389 10*3/uL (ref 150–400)
RBC: 4.85 MIL/uL (ref 3.87–5.11)
RDW: 15.9 % — ABNORMAL HIGH (ref 11.5–15.5)
WBC: 7.2 10*3/uL (ref 4.0–10.5)
nRBC: 0 % (ref 0.0–0.2)

## 2020-03-26 MED ORDER — ONDANSETRON 4 MG PO TBDP: ORAL_TABLET | ORAL | 0 refills | Status: DC

## 2020-03-26 MED ORDER — KETOROLAC TROMETHAMINE 10 MG PO TABS: 10.0000 mg | ORAL_TABLET | Freq: Four times a day (QID) | ORAL | 0 refills | Status: AC | PRN

## 2020-03-26 MED ORDER — KETOROLAC TROMETHAMINE 30 MG/ML IJ SOLN
15.0000 mg | Freq: Once | INTRAMUSCULAR | Status: AC
  Administered 2020-03-26: 15 mg via INTRAVENOUS
  Filled 2020-03-26: qty 1

## 2020-03-26 MED ORDER — DROPERIDOL 2.5 MG/ML IJ SOLN
2.5000 mg | Freq: Once | INTRAMUSCULAR | Status: AC
  Administered 2020-03-26: 03:00:00 2.5 mg via INTRAVENOUS

## 2020-03-26 NOTE — ED Provider Notes (Signed)
[facility] Emergency Department Provider Note  ____________________________________________   First MD Initiated Contact with Patient 03/26/20 0235     (approximate)  I have reviewed the triage vital signs and the nursing notes.   HISTORY  Chief Complaint Flank Pain    HPI Carol Ortega is a 49 y.o. female with medical history as listed below which notably includes  a prior large ureteral stone requiring stenting.  She has had multiple recent ED visits for flank pain.  She presents tonight by EMS for acute onset and severe right-sided sharp stabbing flank pain associated with nausea and vomiting.  She says it feels worse than prior episodes.  Nothing in particular makes it better or worse and she cannot find a position of comfort.  She has had no dysuria and not observed any blood in her urine.  She denies fever, chest pain, shortness of breath, and cough.        Past Medical History:  Diagnosis Date  . Anemia   . Anxiety   . Asthma    seasonal  . Benign brain tumor ([place])   . Brain tumor ([place]) 2014   tx with radiation  . Cancer ([place])   . Complication of anesthesia 1999   lung collapse after surgery for gallbladder  . Depression   . Ectopic pregnancy   . Headache    migraines  . Hoarseness of voice   . Hypertension   . Kidney stone   . Obesity   . Parathyroid abnormality ([place])   . Pulmonary emboli ([place]) 2011  . Pulmonary embolism ([place])   . Radiation    Brain tumor  . UTI (urinary tract infection)   . Vitamin D deficiency     Patient Active Problem List   Diagnosis Date Noted  . Insomnia 07/30/2018  . Chronic pain of both knees 07/30/2018  . Morbid obesity due to excess calories ([place]) 05/09/2018  . Depression, recurrent ([place]) 05/09/2018  . GAD (generalized anxiety disorder) 05/09/2018  . DVT (deep venous thrombosis) ([place]) 04/15/2018  . Bladder spasms 03/12/2018  . Ureteral stone with hydronephrosis 02/28/2018  . Kidney stone  04/09/2017  . H/O pulmonary embolus during pregnancy 08/28/2016  . S/P laparoscopic cholecystectomy 08/28/2016  . Hyperparathyroidism ([place]) 09/10/2015  . Vitamin D deficiency 06/25/2014  . Hypercalcemia 02/09/2014  . Essential hypertension 01/30/2014  . Hyperlipidemia 01/30/2014  . 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 Adult body mass index 50.0-59.9 ([place]) 03/25/2013  . Anxiety state 03/25/2013  . Asthma 03/25/2013  . Obstructive sleep apnea 03/25/2013  . Personal history of venous thrombosis and embolism 03/25/2013  . 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 Brain tumor ([place]) 12/18/2012  . 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 CHOLECYSTECTOMY  1999  . CYSTOSCOPY W/ RETROGRADES Left 03/01/2018   Procedure: CYSTOSCOPY WITH RETROGRADE PYELOGRAM;  Surgeon: Abbie Sons, MD;  Location: [facility];  Service: Urology;  Laterality: Left;  . CYSTOSCOPY W/ URETERAL STENT PLACEMENT Right 04/08/2017   Procedure: CYSTOSCOPY WITH RETROGRADE PYELOGRAM/URETERAL STENT PLACEMENT;  Surgeon: Rana Snare, MD;  Location: [facility];  Service: Urology;  Laterality: Right;  . CYSTOSCOPY W/ URETERAL STENT PLACEMENT Left 03/12/2018   Procedure: CYSTOSCOPY WITH STENT REPLACEMENT;  Surgeon: Abbie Sons, MD;  Location: [facility];  Service: Urology;  Laterality: Left;  . CYSTOSCOPY W/ URETEROSCOPY    . CYSTOSCOPY WITH RETROGRADE PYELOGRAM, URETEROSCOPY AND STENT PLACEMENT Left 01/20/2014   Procedure: LEFT URETEROSCOPY  WITH STENT PLACEMENT;  Surgeon: Irine Seal, MD;  Location: [facility];  Service: Urology;  Laterality: Left;  . CYSTOSCOPY WITH RETROGRADE PYELOGRAM, URETEROSCOPY AND STENT PLACEMENT Bilateral 04/23/2015   Procedure: CYSTOSCOPY WITH BILATERAL RETROGRADE PYELOGRAM, URETEROSCOPY AND STENT PLACEMENT;  Surgeon: Alexis Frock, MD;  Location: [facility];  Service: Urology;  Laterality: Bilateral;  . CYSTOSCOPY WITH RETROGRADE PYELOGRAM, URETEROSCOPY AND  STENT PLACEMENT Right 04/13/2017   Procedure: CYSTOSCOPY WITH RETROGRADE PYELOGRAM, URETEROSCOPY AND STENT EXCHANGE;  Surgeon: Alexis Frock, MD;  Location: [facility];  Service: Urology;  Laterality: Right;  . CYSTOSCOPY WITH STENT PLACEMENT Left 01/12/2014   Procedure: CYSTOSCOPY WITH STENT PLACEMENT left retrograde;  Surgeon: Irine Seal, MD;  Location: [facility];  Service: Urology;  Laterality: Left;  . CYSTOSCOPY WITH STENT PLACEMENT Left 03/01/2018   Procedure: CYSTOSCOPY WITH STENT PLACEMENT;  Surgeon: Abbie Sons, MD;  Location: [facility];  Service: Urology;  Laterality: Left;  . CYSTOSCOPY/URETEROSCOPY/HOLMIUM LASER/STENT PLACEMENT Left 03/12/2018   Procedure: CYSTOSCOPY/URETEROSCOPY/HOLMIUM LASER;  Surgeon: Abbie Sons, MD;  Location: [facility];  Service: Urology;  Laterality: Left;  . HOLMIUM LASER APPLICATION Left [id number]   Procedure: HOLMIUM LASER APPLICATION;  Surgeon: Irine Seal, MD;  Location: [facility];  Service: Urology;  Laterality: Left;  . HOLMIUM LASER APPLICATION Bilateral [id number]   Procedure: HOLMIUM LASER APPLICATION;  Surgeon: Alexis Frock, MD;  Location: [facility];  Service: Urology;  Laterality: Bilateral;  . HOLMIUM LASER APPLICATION Right [id number]   Procedure: HOLMIUM LASER APPLICATION;  Surgeon: Alexis Frock, MD;  Location: [facility];  Service: Urology;  Laterality: Right;  . kidney stone removal    . LITHOTRIPSY    . PARATHYROIDECTOMY Right 11/13/2016   Procedure: RIGHT SUPERIOR PARATHYROIDECTOMY;  Surgeon: Armandina Gemma, MD;  Location: [place];  Service: General;  Laterality: Right;  . surgery for,ectopic pregnancy  2011    1 fallopian tube rupture  . TUBAL LIGATION  2011    Prior to Admission medications   Medication Sig Start Date End Date Taking? Authorizing Provider  acetaminophen (TYLENOL) 500 MG tablet Take 500 mg by mouth every 6 (six) hours as needed.    [provider]  albuterol (PROVENTIL HFA;VENTOLIN HFA) 108 (90 Base) MCG/ACT inhaler Inhale 2  puffs into the lungs every 6 (six) hours as needed for wheezing or shortness of breath. 03/04/18   Loletha Grayer, MD  albuterol (PROVENTIL) (2.5 MG/3ML) 0.083% nebulizer solution Take 3 mLs (2.5 mg total) by nebulization every 6 (six) hours as needed for wheezing or shortness of breath. 03/04/18   Loletha Grayer, MD  ARIPiprazole (ABILIFY) 10 MG tablet Take 1 tablet (10 mg total) by mouth daily. For mood 09/23/18   Ursula Alert, MD  citalopram (CELEXA) 10 MG tablet Take 1 tablet (10 mg total) by mouth daily with breakfast. For anxiety 10/23/18   Ursula Alert, MD  cyclobenzaprine (FLEXERIL) 10 MG tablet Take 1 tablet (10 mg total) by mouth 3 (three) times daily as needed for muscle spasms. DO NOT DRIVE ON THIS MEDICINE 11/27/18   Park Liter P, DO  diclofenac sodium (VOLTAREN) 1 % GEL Apply 4 g topically 4 (four) times daily. 07/30/18   Johnson, Megan P, DO  diphenhydrAMINE (BENADRYL) 25 MG tablet Take 25 mg by mouth daily as needed for allergies.    [provider]  hydrOXYzine (VISTARIL) 25 MG capsule Take 1 capsule (25 mg total) by mouth 2 (two) times daily as needed (severe anxiety symptoms). 10/23/18   Ursula Alert, MD  ketorolac (TORADOL) 10  MG tablet Take 1 tablet (10 mg total) by mouth every 6 (six) hours as needed for up to 5 days for moderate pain or severe pain. 03/26/20 03/31/20  Hinda Kehr, MD  naproxen (NAPROSYN) 500 MG tablet Take 1 tablet (500 mg total) by mouth 2 (two) times daily with a meal. 11/27/18   Johnson, Megan P, DO  omeprazole (PRILOSEC) 20 MG capsule Take 1 capsule (20 mg total) by mouth daily. 09/26/18   Johnson, Megan P, DO  ondansetron (ZOFRAN ODT) 4 MG disintegrating tablet Allow 1-2 tablets to dissolve in your mouth every 8 hours as needed for nausea/vomiting 03/26/20   Hinda Kehr, MD  oxyCODONE-acetaminophen (PERCOCET/ROXICET) 5-325 MG tablet Take 1 tablet by mouth every 4 (four) hours as needed for severe pain. 03/16/20   Paulette Blanch, MD    propranolol (INDERAL) 10 MG tablet TAKE 1 TABLET BY MOUTH THREE TIMES DAILY AS NEEDED FOR SEVERE ANXIETY SYMPTOMS 09/11/18   [provider]  traZODone (DESYREL) 150 MG tablet Take 1 tablet (150 mg total) by mouth at bedtime. 09/23/18   Ursula Alert, MD  Vitamin D, Ergocalciferol, (DRISDOL) 50000 units CAPS capsule Take 1 capsule (50,000 Units total) by mouth every 7 (seven) days. 07/31/18   Park Liter P, DO    Allergies Bee venom, Contrast media [iodinated diagnostic agents], Eggs or egg-derived products, Other, Penicillins, Wellbutrin [bupropion], Latex, and Oxycodone-acetaminophen  Family History  Problem Relation Age of Onset  . Bell's palsy Mother   . Heart failure Mother   . Hypertension Mother   . Lupus Mother   . Anxiety disorder Mother   . Depression Mother   . Stroke Mother   . Asthma Father   . Hypertension Father   . Hyperlipidemia Father   . Anxiety disorder Father   . Depression Father   . Hypertension Maternal Grandmother   . Diabetes Maternal Grandmother   . Sarcoidosis Maternal Grandmother   . Anxiety disorder Sister   . Depression Sister   . Bipolar disorder Sister   . Depression Son   . Bipolar disorder Son     Social History Social History   Tobacco Use  . Smoking status: Never Smoker  . Smokeless tobacco: Never Used  Substance Use Topics  . Alcohol use: Not Currently  . Drug use: No    Review of Systems Constitutional: No fever/chills Eyes: No visual changes. ENT: No sore throat. Cardiovascular: Denies chest pain. Respiratory: Denies shortness of breath. Gastrointestinal: Severe right flank pain but no other abdominal pain.  Also had multiple episodes of nausea and vomiting. Genitourinary: Negative for dysuria. Musculoskeletal: Severe right flank pain as described above.   Integumentary: Negative for rash. Neurological: Negative for headaches, focal weakness or  numbness.   ____________________________________________   PHYSICAL EXAM:  VITAL SIGNS: ED Triage Vitals [03/26/20 0225]  Enc Vitals Group     BP (!) 128/93     Pulse Rate 89     Resp 20     Temp 97.6 F (36.4 C)     Temp Source Oral     SpO2 100 %     Weight (!) 154 kg (339 lb 8.1 oz)     Height      Head Circumference      Peak Flow      Pain Score 10     Pain Loc      Pain Edu?      Excl. in [place]?     Constitutional: Alert and oriented.  The  patient presented initially apparently in a great deal of distress, moaning and vomiting versus spitting up saliva. Eyes: Conjunctivae are normal.  Head: Atraumatic. Nose: No congestion/rhinnorhea. Mouth/Throat: Patient is wearing a mask. Neck: No stridor.  No meningeal signs.   Cardiovascular: Normal rate, regular rhythm. Good peripheral circulation. Grossly normal heart sounds. Respiratory: Normal respiratory effort.  No retractions. Gastrointestinal: Obese.  Soft and nontender. No distention.  Musculoskeletal: Right CVA tenderness to percussion.  No lower extremity tenderness nor edema. No gross deformities of extremities. Neurologic:  Normal speech and language. No gross focal neurologic deficits are appreciated.  Skin:  Skin is warm, dry and intact. Psychiatric: Mood and affect are normal. Speech and behavior are normal.  ____________________________________________   LABS (all labs ordered are listed, but only abnormal results are displayed)  Labs Reviewed  CBC - Abnormal; Notable for the following components:      Result Value   MCH 25.8 (*)    RDW 15.9 (*)    All other components within normal limits  COMPREHENSIVE METABOLIC PANEL - Abnormal; Notable for the following components:   Glucose, Bld 127 (*)    Total Protein 8.2 (*)    All other components within normal limits  URINALYSIS, COMPLETE (UACMP) WITH MICROSCOPIC   ____________________________________________  EKG  No indication for emergent EKG.  Prior  EKG demonstrates no QTC prolongation. ____________________________________________  RADIOLOGY Ursula Alert, personally viewed and evaluated these images (plain radiographs) as part of my medical decision making, as well as reviewing the written report by the radiologist.  ED MD interpretation:  No acute abnormality identified on abdomen x-ray.  Official radiology report(s): DG Abdomen 1 View  Result Date: 03/26/2020 CLINICAL DATA:  Right flank pain with possible ureteral stone. EXAM: ABDOMEN - 1 VIEW COMPARISON:  CT dated March 16, 2020 FINDINGS: The small nephroliths visualized on the patient's prior CT are not definitively identified on today's study, likely secondary to their small size. There is no definite ureteral stone. There is a moderate amount of stool throughout the colon. Phleboliths project over the patient's pelvis. There are mild degenerative changes of both hips. The patient is status post prior cholecystectomy. IMPRESSION: No definite ureteral stone identified on today's study. The small nephroliths visualized on the patient's prior CT are not definitively identified on today's study, likely secondary to their small size. Electronically Signed   By: Constance Holster M.D.   On: 03/26/2020 03:54    ____________________________________________   PROCEDURES   Procedure(s) performed (including Critical Care):  .1-3 Lead EKG Interpretation Performed by: Hinda Kehr, MD Authorized by: Hinda Kehr, MD     Interpretation: normal     ECG rate:  82   ECG rate assessment: normal     Rhythm: sinus rhythm     Ectopy: none       ____________________________________________   INITIAL IMPRESSION / MDM / ASSESSMENT AND PLAN / ED COURSE  As part of my medical decision making, I reviewed the following data within the [place] History obtained from family, Nursing notes reviewed and incorporated, Labs reviewed , Old chart reviewed, Radiograph reviewed ,  Notes from prior ED visits and [place] Controlled Substance Database   Differential diagnosis includes, but is not limited to, acute on chronic nonspecific pain, ovarian cyst, musculoskeletal strain, ureteral/renal colic, UTI/pyelonephritis, ovarian cyst or torsion.  I reviewed the medical record and see that she has had at least 2 renal colic protocol CTs recently that did not demonstrate any clinically significant kidney  stone; she had a few small intrarenal calculi but no ureteral stones.  However her presentation is consistent with ureteral colic.  Given the history of multiple CT scans in the past I will attempt to hold off on a CT scan although she may require one if her pain is not better controlled and if she remains symptomatic.  The pain is not in a location that would be suggestive of ovarian cyst or torsion and on her prior CT she did not have any sign of clinically significant cyst(s).  This is, in my estimation, a less likely diagnosis.  I will, however, attempt to assess with a 1 view abdominal x-ray to see if there is any clinically significant stone and a ureteral distribution.  For her severe vomiting and discomfort I have ordered Toradol 15 mg IV and droperidol 2.5 mg IV.  We will maintain her on the cardiac monitor to look for any evidence of arrhythmia and she is on continuous pulse oximeter.  Lab work including urinalysis is pending.      Clinical Course as of Mar 26 716  Fri Mar 26, 2020  0233 WBC: 7.2 [CF]  0333 Normal CMP  Comprehensive metabolic panel(!) [CF]  99991111 No definitive ureteral stones identified on plain x-ray.  Previously seen renal calculi also not identified, likely secondary to their small size on CT scan.  DG Abdomen 1 View [CF]  0422 The patient has been sleeping.  I woke her up and she said she feels much better.  I updated her about the results and that there appears to be no clinically significant ureteral stone.  She understood why I was not ordering a CT  scan at this time.  She said that she has an appointment scheduled at [facility] urological for next week and I will send a message to Dr. Erlene Quan to let her know about another ED visit.  At this point there is no evidence of emergent medical condition and no need for admission.  She has not been having any dysuria and she has no leukocytosis or fever and I do not think a urinalysis is going to change the disposition plan.  She is comfortable with the plan for discharge and outpatient follow-up.  I gave my usual and customary return precautions.   [CF]    Clinical Course User Index [CF] Hinda Kehr, MD     ____________________________________________  FINAL CLINICAL IMPRESSION(S) / ED DIAGNOSES  Final diagnoses:  Right flank pain     MEDICATIONS GIVEN DURING THIS VISIT:  Medications  droperidol (INAPSINE) 2.5 MG/ML injection 2.5 mg (2.5 mg Intravenous Given 03/26/20 0248)  ketorolac (TORADOL) 30 MG/ML injection 15 mg (15 mg Intravenous Given 03/26/20 0247)     ED Discharge Orders         Ordered    ketorolac (TORADOL) 10 MG tablet  Every 6 hours PRN     03/26/20 0425    ondansetron (ZOFRAN ODT) 4 MG disintegrating tablet     03/26/20 0425          *Please note:  Meli Louro was evaluated in Emergency Department on 03/26/2020 for the symptoms described in the history of present illness. She was evaluated in the context of the global COVID-19 pandemic, which necessitated consideration that the patient might be at risk for infection with the SARS-CoV-2 virus that causes COVID-19. Institutional protocols and algorithms that pertain to the evaluation of patients at risk for COVID-19 are in a state of rapid change based on information  released by regulatory bodies including the CDC and federal and state organizations. These policies and algorithms were followed during the patient's care in the ED.  Some ED evaluations and interventions may be delayed as a result of limited staffing  during the pandemic.*  Note:  This document was prepared using Dragon voice recognition software and may include unintentional dictation errors.   Hinda Kehr, MD 03/26/20 [phone]

## 2020-03-26 NOTE — ED Triage Notes (Signed)
Pt co right flank pain that started at 2000 yesterday, was dx last week with kidney stones. States took 2 percocet prior to coming in and states pain not improved.

## 2020-03-26 NOTE — Discharge Instructions (Signed)
As we discussed, you have no evidence of emergent medical condition at this time including no evidence of a ureteral stone on your x-ray.  I recommend that you follow-up with urology as planned and I also sent a message to Dr. Erlene Quan (the local urologist) to help facilitate your follow-up.  Please drink plenty of fluids, take Tylenol 1000 mg every 6 hours, and use the prescribed medication that should also help from pain.  Return to the emergency department if you develop new or worsening symptoms that concern you.

## 2020-04-28 ENCOUNTER — Encounter: Payer: Self-pay | Admitting: Urology

## 2020-04-28 ENCOUNTER — Ambulatory Visit: Payer: Self-pay | Admitting: Urology

## 2020-04-28 NOTE — Progress Notes (Incomplete)
04/28/20 9:40 AM   Carol Ortega [birth year] [id number]  Referring provider: Berkley Harvey, NP [name],  [name] [address] No chief complaint on file.   HPI: Carol Ortega is a 48 y.o. black female who presents today for ER follow up. - Cystoscopy/uteroscopy on 03/12/2018 - Procedure visit for nephrolithiasis on 03/21/2018 - Multiple recent ED visits for flank pain     PMH: Past Medical History:  Diagnosis Date  . Anemia   . Anxiety   . Asthma    seasonal  . Benign brain tumor ([place])   . Brain tumor ([place]) 2014   tx with radiation  . Cancer ([place])   . Complication of anesthesia 1999   lung collapse after surgery for gallbladder  . Depression   . Ectopic pregnancy   . Headache    migraines  . Hoarseness of voice   . Hypertension   . Kidney stone   . Obesity   . Parathyroid abnormality ([place])   . Pulmonary emboli ([place]) 2011  . Pulmonary embolism ([place])   . Radiation    Brain tumor  . UTI (urinary tract infection)   . Vitamin D deficiency     Surgical History: Past Surgical History:  Procedure Laterality Date  . CHOLECYSTECTOMY  1999  . CYSTOSCOPY W/ RETROGRADES Left 03/01/2018   Procedure: CYSTOSCOPY WITH RETROGRADE PYELOGRAM;  Surgeon: Abbie Sons, MD;  Location: [facility];  Service: Urology;  Laterality: Left;  . CYSTOSCOPY W/ URETERAL STENT PLACEMENT Right 04/08/2017   Procedure: CYSTOSCOPY WITH RETROGRADE PYELOGRAM/URETERAL STENT PLACEMENT;  Surgeon: Rana Snare, MD;  Location: [facility];  Service: Urology;  Laterality: Right;  . CYSTOSCOPY W/ URETERAL STENT PLACEMENT Left 03/12/2018   Procedure: CYSTOSCOPY WITH STENT REPLACEMENT;  Surgeon: Abbie Sons, MD;  Location: [facility];  Service: Urology;  Laterality: Left;  . CYSTOSCOPY W/ URETEROSCOPY    . CYSTOSCOPY WITH RETROGRADE PYELOGRAM, URETEROSCOPY AND STENT PLACEMENT Left 01/20/2014   Procedure: LEFT URETEROSCOPY WITH STENT PLACEMENT;  Surgeon: Irine Seal, MD;  Location: [facility];  Service: Urology;  Laterality: Left;  . CYSTOSCOPY WITH RETROGRADE PYELOGRAM, URETEROSCOPY AND STENT PLACEMENT Bilateral 04/23/2015   Procedure: CYSTOSCOPY WITH BILATERAL RETROGRADE PYELOGRAM, URETEROSCOPY AND STENT PLACEMENT;  Surgeon: Alexis Frock, MD;  Location: [facility];  Service: Urology;  Laterality: Bilateral;  . CYSTOSCOPY WITH RETROGRADE PYELOGRAM, URETEROSCOPY AND STENT PLACEMENT Right 04/13/2017   Procedure: CYSTOSCOPY WITH RETROGRADE PYELOGRAM, URETEROSCOPY AND STENT EXCHANGE;  Surgeon: Alexis Frock, MD;  Location: [facility];  Service: Urology;  Laterality: Right;  . CYSTOSCOPY WITH STENT PLACEMENT Left 01/12/2014   Procedure: CYSTOSCOPY WITH STENT PLACEMENT left retrograde;  Surgeon: Irine Seal, MD;  Location: [facility];  Service: Urology;  Laterality: Left;  . CYSTOSCOPY WITH STENT PLACEMENT Left 03/01/2018   Procedure: CYSTOSCOPY WITH STENT PLACEMENT;  Surgeon: Abbie Sons, MD;  Location: [facility];  Service: Urology;  Laterality: Left;  . CYSTOSCOPY/URETEROSCOPY/HOLMIUM LASER/STENT PLACEMENT Left 03/12/2018   Procedure: CYSTOSCOPY/URETEROSCOPY/HOLMIUM LASER;  Surgeon: Abbie Sons, MD;  Location: [facility];  Service: Urology;  Laterality: Left;  . HOLMIUM LASER APPLICATION Left [id number]   Procedure: HOLMIUM LASER APPLICATION;  Surgeon: Irine Seal, MD;  Location: [facility];  Service: Urology;  Laterality: Left;  . HOLMIUM LASER APPLICATION Bilateral [id number]   Procedure: HOLMIUM LASER APPLICATION;  Surgeon: Alexis Frock, MD;  Location: [facility];  Service: Urology;  Laterality: Bilateral;  . HOLMIUM LASER APPLICATION Right [id number]   Procedure: HOLMIUM LASER APPLICATION;  Surgeon: Alexis Frock, MD;  Location: [facility];  Service: Urology;  Laterality: Right;  . kidney stone removal    . LITHOTRIPSY    . PARATHYROIDECTOMY Right 11/13/2016   Procedure: RIGHT SUPERIOR PARATHYROIDECTOMY;  Surgeon: Armandina Gemma, MD;  Location: [facility];  Service: General;  Laterality: Right;  . surgery  for,ectopic pregnancy  2011    1 fallopian tube rupture  . TUBAL LIGATION  2011    Home Medications:  Allergies as of 04/28/2020      Reactions   Bee Venom Anaphylaxis   Contrast Media [iodinated Diagnostic Agents] Anaphylaxis   Eggs Or Egg-derived Products Anaphylaxis   Other Anaphylaxis   Surgical dye.. Pt states ok to take if previously taken benadryl or prednisone   Penicillins Hives, Other (See Comments)   Has patient had a PCN reaction causing immediate rash, facial/tongue/throat swelling, SOB or lightheadedness with hypotension: No Has patient had a PCN reaction causing severe rash involving mucus membranes or skin necrosis: No Has patient had a PCN reaction that required hospitalization No Has patient had a PCN reaction occurring within the last 10 years: No If all of the above answers are "NO", then may proceed with Cephalosporin use.   Wellbutrin [bupropion] Other (See Comments)   irritiability   Latex Rash   Oxycodone-acetaminophen Itching      Medication List       Accurate as of Apr 28, 2020  9:40 AM. If you have any questions, ask your nurse or doctor.        acetaminophen 500 MG tablet Commonly known as: TYLENOL Take 500 mg by mouth every 6 (six) hours as needed.   albuterol (2.5 MG/3ML) 0.083% nebulizer solution Commonly known as: PROVENTIL Take 3 mLs (2.5 mg total) by nebulization every 6 (six) hours as needed for wheezing or shortness of breath.   albuterol 108 (90 Base) MCG/ACT inhaler Commonly known as: VENTOLIN HFA Inhale 2 puffs into the lungs every 6 (six) hours as needed for wheezing or shortness of breath.   ARIPiprazole 10 MG tablet Commonly known as: Abilify Take 1 tablet (10 mg total) by mouth daily. For mood   citalopram 10 MG tablet Commonly known as: CELEXA Take 1 tablet (10 mg total) by mouth daily with breakfast. For anxiety   cyclobenzaprine 10 MG tablet Commonly known as: FLEXERIL Take 1 tablet (10 mg total) by mouth 3 (three)  times daily as needed for muscle spasms. DO NOT DRIVE ON THIS MEDICINE   diclofenac sodium 1 % Gel Commonly known as: VOLTAREN Apply 4 g topically 4 (four) times daily.   diphenhydrAMINE 25 MG tablet Commonly known as: BENADRYL Take 25 mg by mouth daily as needed for allergies.   hydrOXYzine 25 MG capsule Commonly known as: Vistaril Take 1 capsule (25 mg total) by mouth 2 (two) times daily as needed (severe anxiety symptoms).   naproxen 500 MG tablet Commonly known as: Naprosyn Take 1 tablet (500 mg total) by mouth 2 (two) times daily with a meal.   omeprazole 20 MG capsule Commonly known as: PRILOSEC Take 1 capsule (20 mg total) by mouth daily.   ondansetron 4 MG disintegrating tablet Commonly known as: Zofran ODT Allow 1-2 tablets to dissolve in your mouth every 8 hours as needed for nausea/vomiting   oxyCODONE-acetaminophen 5-325 MG tablet Commonly known as: PERCOCET/ROXICET Take 1 tablet by mouth every 4 (four) hours as needed for severe pain.   propranolol 10 MG tablet Commonly known as: INDERAL TAKE 1 TABLET BY MOUTH  THREE TIMES DAILY AS NEEDED FOR SEVERE ANXIETY SYMPTOMS   traZODone 150 MG tablet Commonly known as: DESYREL Take 1 tablet (150 mg total) by mouth at bedtime.   Vitamin D (Ergocalciferol) 1.25 MG (50000 UNIT) Caps capsule Commonly known as: DRISDOL Take 1 capsule (50,000 Units total) by mouth every 7 (seven) days.       Allergies:  Allergies  Allergen Reactions  . Bee Venom Anaphylaxis  . Contrast Media [Iodinated Diagnostic Agents] Anaphylaxis  . Eggs Or Egg-Derived Products Anaphylaxis  . Other Anaphylaxis    Surgical dye.. Pt states ok to take if previously taken benadryl or prednisone  . Penicillins Hives and Other (See Comments)    Has patient had a PCN reaction causing immediate rash, facial/tongue/throat swelling, SOB or lightheadedness with hypotension: No Has patient had a PCN reaction causing severe rash involving mucus membranes or  skin necrosis: No Has patient had a PCN reaction that required hospitalization No Has patient had a PCN reaction occurring within the last 10 years: No If all of the above answers are "NO", then may proceed with Cephalosporin use.  . Wellbutrin [Bupropion] Other (See Comments)    irritiability  . Latex Rash  . Oxycodone-Acetaminophen Itching    Family History: Family History  Problem Relation Age of Onset  . Bell's palsy Mother   . Heart failure Mother   . Hypertension Mother   . Lupus Mother   . Anxiety disorder Mother   . Depression Mother   . Stroke Mother   . Asthma Father   . Hypertension Father   . Hyperlipidemia Father   . Anxiety disorder Father   . Depression Father   . Hypertension Maternal Grandmother   . Diabetes Maternal Grandmother   . Sarcoidosis Maternal Grandmother   . Anxiety disorder Sister   . Depression Sister   . Bipolar disorder Sister   . Depression Son   . Bipolar disorder Son     Social History:  reports that she has never smoked. She has never used smokeless tobacco. She reports previous alcohol use. She reports that she does not use drugs.   Physical Exam: There were no vitals taken for this visit.  Constitutional:  Alert and oriented, No acute distress. HEENT: [place] AT, moist mucus membranes.  Trachea midline, no masses. Cardiovascular: No clubbing, cyanosis, or edema. Respiratory: Normal respiratory effort, no increased work of breathing. GI: Abdomen is soft, nontender, nondistended, no abdominal masses GU: No CVA tenderness Lymph: No cervical or inguinal lymphadenopathy. Skin: No rashes, bruises or suspicious lesions. Neurologic: Grossly intact, no focal deficits, moving all 4 extremities. Psychiatric: Normal mood and affect.  Laboratory Data:  Lab Results  Component Value Date   CREATININE 0.90 03/26/2020    No results found for: PSA  No results found for: TESTOSTERONE  Lab Results  Component Value Date   HGBA1C 5.8  05/09/2018    Urinalysis   Pertinent Imaging: *** Results for orders placed during the hospital encounter of 03/26/20  DG Abdomen 1 View   Narrative CLINICAL DATA:  Right flank pain with possible ureteral stone.  EXAM: ABDOMEN - 1 VIEW  COMPARISON:  CT dated March 16, 2020  FINDINGS: The small nephroliths visualized on the patient's prior CT are not definitively identified on today's study, likely secondary to their small size. There is no definite ureteral stone. There is a moderate amount of stool throughout the colon. Phleboliths project over the patient's pelvis. There are mild degenerative changes of both hips. The patient  is status post prior cholecystectomy.  IMPRESSION: No definite ureteral stone identified on today's study. The small nephroliths visualized on the patient's prior CT are not definitively identified on today's study, likely secondary to their small size.   Electronically Signed   By: Constance Holster M.D.   On: 03/26/2020 03:54    No results found for this or any previous visit. No results found for this or any previous visit. No results found for this or any previous visit. Results for orders placed during the hospital encounter of 11/06/15  US Renal   Narrative CLINICAL DATA:  Patient with left flank pain for 1 day.  EXAM: RENAL / URINARY TRACT ULTRASOUND COMPLETE  COMPARISON:  CT abdomen pelvis 10/11/2015.  FINDINGS: Right Kidney:  Length: 14.2 cm. Normal renal cortical thickness and echogenicity. No hydronephrosis. There are a few tiny punctate echogenic foci most compatible with known renal stones. There is a 1.7 cm cyst within the right kidney.  Left Kidney:  Length: 15.7 cm. Mild hydronephrosis. Multiple small cyst within the left kidney. Additionally there are small punctate hyperechoic foci compatible with known small stones.  Bladder:  Appears normal for degree of bladder distention.  IMPRESSION: Mild left  hydronephrosis.  Bilateral renal stones.   Electronically Signed   By: Lovey Newcomer M.D.   On: 11/06/2015 12:22    No results found for this or any previous visit. No results found for this or any previous visit. Results for orders placed during the hospital encounter of 03/16/20  CT Renal Stone Study   Narrative CLINICAL DATA:  Left-sided flank pain for several weeks  EXAM: CT ABDOMEN AND PELVIS WITHOUT CONTRAST  TECHNIQUE: Multidetector CT imaging of the abdomen and pelvis was performed following the standard protocol without IV contrast.  COMPARISON:  08/06/2019  FINDINGS: Lower chest: No acute abnormality.  Hepatobiliary: No focal liver abnormality is seen. Status post cholecystectomy. No biliary dilatation.  Pancreas: Unremarkable. No pancreatic ductal dilatation or surrounding inflammatory changes.  Spleen: Normal in size without focal abnormality.  Adrenals/Urinary Tract: Adrenal glands are within normal limits. Kidneys are well visualized bilaterally. Right kidney is well visualized without obstructive change. Tiny 1-2 mm nonobstructing stone is noted in the lower pole. Hypodensity is noted posteriorly consistent with a focal cyst. No obstructive changes are seen. The left kidney demonstrates a 5 mm nonobstructing stone. Bladder is partially distended.  Stomach/Bowel: Scattered diverticular changes noted without evidence of diverticulitis. The appendix is within normal limits. No small bowel abnormality is seen. The stomach is decompressed.  Vascular/Lymphatic: Aortic atherosclerosis. No enlarged abdominal or pelvic lymph nodes.  Reproductive: Uterus and bilateral adnexa are unremarkable.  Other: No abdominal wall hernia or abnormality. No abdominopelvic ascites.  Musculoskeletal: No acute or significant osseous findings.  IMPRESSION: Nonobstructing calculi bilaterally as described above.  No acute abnormality noted.   Electronically Signed    By: Inez Catalina M.D.   On: 03/16/2020 02:38     Assessment & Plan:     No follow-ups on file.  [facility] [address] [place], [place] [address] [phone]

## 2020-05-21 ENCOUNTER — Emergency Department: Payer: Medicaid Other

## 2020-05-21 ENCOUNTER — Emergency Department
Admission: EM | Admit: 2020-05-21 | Discharge: 2020-05-21 | Disposition: A | Discharge status: Home / Self Care | Payer: Medicaid Other | Attending: Emergency Medicine | Admitting: Emergency Medicine

## 2020-05-21 ENCOUNTER — Other Ambulatory Visit: Payer: Self-pay

## 2020-05-21 DIAGNOSIS — D320 Benign neoplasm of cerebral meninges: Secondary | ICD-10-CM | POA: Insufficient documentation

## 2020-05-21 DIAGNOSIS — M5416 Radiculopathy, lumbar region: Secondary | ICD-10-CM

## 2020-05-21 DIAGNOSIS — I10 Essential (primary) hypertension: Secondary | ICD-10-CM | POA: Diagnosis not present

## 2020-05-21 DIAGNOSIS — Z91040 Latex allergy status: Secondary | ICD-10-CM | POA: Diagnosis not present

## 2020-05-21 DIAGNOSIS — M79661 Pain in right lower leg: Secondary | ICD-10-CM | POA: Diagnosis present

## 2020-05-21 DIAGNOSIS — J45909 Unspecified asthma, uncomplicated: Secondary | ICD-10-CM | POA: Diagnosis not present

## 2020-05-21 DIAGNOSIS — Z86011 Personal history of benign neoplasm of the brain: Secondary | ICD-10-CM | POA: Diagnosis not present

## 2020-05-21 DIAGNOSIS — Z79899 Other long term (current) drug therapy: Secondary | ICD-10-CM | POA: Insufficient documentation

## 2020-05-21 MED ORDER — CYCLOBENZAPRINE HCL 5 MG PO TABS: ORAL_TABLET | ORAL | 0 refills | Status: DC

## 2020-05-21 NOTE — ED Provider Notes (Signed)
[facility] Emergency Department Provider Note  ____________________________________________  Time seen: Approximately 12:56 PM  I have reviewed the triage vital signs and the nursing notes.   HISTORY  Chief Complaint Leg Pain    HPI Carol Ortega is a 49 y.o. female that presents to the emergency department for evaluation of right calf pain that radiates into right side of thigh for 2 days. Patient has a history of a DVT and wanted to make sure that she did not have another. She also has had a history of sciatica. She has been put on muscle relaxers and anti-inflammatories in the past for her sciatica. She did have some low back pain 2 days ago but this is improving. Sometimes she gets some tingling to the side of her thigh. No bowel or bladder dysfunction or saddle anesthesias. She is walking. Patient has been sitting a lot for work. No fever, urinary symptoms, abdominal pain, weakness.  Past Medical History:  Diagnosis Date  . Anemia   . Anxiety   . Asthma    seasonal  . Benign brain tumor ([place])   . Brain tumor ([place]) 2014   tx with radiation  . Cancer ([place])   . Complication of anesthesia 1999   lung collapse after surgery for gallbladder  . Depression   . Ectopic pregnancy   . Headache    migraines  . Hoarseness of voice   . Hypertension   . Kidney stone   . Obesity   . Parathyroid abnormality ([place])   . Pulmonary emboli ([place]) 2011  . Pulmonary embolism ([place])   . Radiation    Brain tumor  . UTI (urinary tract infection)   . Vitamin D deficiency     Patient Active Problem List   Diagnosis Date Noted  . Insomnia 07/30/2018  . Chronic pain of both knees 07/30/2018  . Morbid obesity due to excess calories ([place]) 05/09/2018  . Depression, recurrent ([place]) 05/09/2018  . GAD (generalized anxiety disorder) 05/09/2018  . DVT (deep venous thrombosis) ([place]) 04/15/2018  . Bladder spasms 03/12/2018  . Ureteral stone with hydronephrosis 02/28/2018   . Kidney stone 04/09/2017  . H/O pulmonary embolus during pregnancy 08/28/2016  . S/P laparoscopic cholecystectomy 08/28/2016  . Hyperparathyroidism ([place]) 09/10/2015  . Vitamin D deficiency 06/25/2014  . Hypercalcemia 02/09/2014  . Essential hypertension 01/30/2014  . Hyperlipidemia 01/30/2014  . 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 Adult body mass index 50.0-59.9 ([place]) 03/25/2013  . Anxiety state 03/25/2013  . Asthma 03/25/2013  . Obstructive sleep apnea 03/25/2013  . Personal history of venous thrombosis and embolism 03/25/2013  . 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 Brain tumor ([place]) 12/18/2012  . 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 CHOLECYSTECTOMY  1999  . CYSTOSCOPY W/ RETROGRADES Left 03/01/2018   Procedure: CYSTOSCOPY WITH RETROGRADE PYELOGRAM;  Surgeon: Abbie Sons, MD;  Location: [facility];  Service: Urology;  Laterality: Left;  . CYSTOSCOPY W/ URETERAL STENT PLACEMENT Right 04/08/2017   Procedure: CYSTOSCOPY WITH RETROGRADE PYELOGRAM/URETERAL STENT PLACEMENT;  Surgeon: Rana Snare, MD;  Location: [facility];  Service: Urology;  Laterality: Right;  . CYSTOSCOPY W/ URETERAL STENT PLACEMENT Left 03/12/2018   Procedure: CYSTOSCOPY WITH STENT REPLACEMENT;  Surgeon: Abbie Sons, MD;  Location: [facility];  Service: Urology;  Laterality: Left;  . CYSTOSCOPY W/ URETEROSCOPY    . CYSTOSCOPY WITH RETROGRADE PYELOGRAM, URETEROSCOPY AND STENT PLACEMENT Left 01/20/2014   Procedure: LEFT URETEROSCOPY WITH STENT  PLACEMENT;  Surgeon: Irine Seal, MD;  Location: [facility];  Service: Urology;  Laterality: Left;  . CYSTOSCOPY WITH RETROGRADE PYELOGRAM, URETEROSCOPY AND STENT PLACEMENT Bilateral 04/23/2015   Procedure: CYSTOSCOPY WITH BILATERAL RETROGRADE PYELOGRAM, URETEROSCOPY AND STENT PLACEMENT;  Surgeon: Alexis Frock, MD;  Location: [facility];  Service: Urology;  Laterality: Bilateral;  . CYSTOSCOPY WITH RETROGRADE PYELOGRAM,  URETEROSCOPY AND STENT PLACEMENT Right 04/13/2017   Procedure: CYSTOSCOPY WITH RETROGRADE PYELOGRAM, URETEROSCOPY AND STENT EXCHANGE;  Surgeon: Alexis Frock, MD;  Location: [facility];  Service: Urology;  Laterality: Right;  . CYSTOSCOPY WITH STENT PLACEMENT Left 01/12/2014   Procedure: CYSTOSCOPY WITH STENT PLACEMENT left retrograde;  Surgeon: Irine Seal, MD;  Location: [facility];  Service: Urology;  Laterality: Left;  . CYSTOSCOPY WITH STENT PLACEMENT Left 03/01/2018   Procedure: CYSTOSCOPY WITH STENT PLACEMENT;  Surgeon: Abbie Sons, MD;  Location: [facility];  Service: Urology;  Laterality: Left;  . CYSTOSCOPY/URETEROSCOPY/HOLMIUM LASER/STENT PLACEMENT Left 03/12/2018   Procedure: CYSTOSCOPY/URETEROSCOPY/HOLMIUM LASER;  Surgeon: Abbie Sons, MD;  Location: [facility];  Service: Urology;  Laterality: Left;  . HOLMIUM LASER APPLICATION Left 04/21/6567   Procedure: HOLMIUM LASER APPLICATION;  Surgeon: Irine Seal, MD;  [facility];  Service: Urology;  Laterality: Left;  . HOLMIUM LASER APPLICATION Bilateral 12/20/7515   Procedure: HOLMIUM LASER APPLICATION;  Surgeon: Alexis Frock, MD;  Location: [facility];  Service: Urology;  Laterality: Bilateral;  . HOLMIUM LASER APPLICATION Right 0/12/7492   Procedure: HOLMIUM LASER APPLICATION;  Surgeon: Alexis Frock, MD;  Location: [facility];  Service: Urology;  Laterality: Right;  . kidney stone removal    . LITHOTRIPSY    . PARATHYROIDECTOMY Right 11/13/2016   Procedure: RIGHT SUPERIOR PARATHYROIDECTOMY;  Surgeon: Armandina Gemma, MD;  Location: [place];  Service: General;  Laterality: Right;  . surgery for,ectopic pregnancy  2011    1 fallopian tube rupture  . TUBAL LIGATION  2011    Prior to Admission medications   Medication Sig Start Date End Date Taking? Authorizing Provider  acetaminophen (TYLENOL) 500 MG tablet Take 500 mg by mouth every 6 (six) hours as needed.    [provider]  albuterol (PROVENTIL HFA;VENTOLIN HFA) 108 (90 Base) MCG/ACT  inhaler Inhale 2 puffs into the lungs every 6 (six) hours as needed for wheezing or shortness of breath. 03/04/18   Loletha Grayer, MD  albuterol (PROVENTIL) (2.5 MG/3ML) 0.083% nebulizer solution Take 3 mLs (2.5 mg total) by nebulization every 6 (six) hours as needed for wheezing or shortness of breath. 03/04/18   Loletha Grayer, MD  ARIPiprazole (ABILIFY) 10 MG tablet Take 1 tablet (10 mg total) by mouth daily. For mood 09/23/18   Ursula Alert, MD  citalopram (CELEXA) 10 MG tablet Take 1 tablet (10 mg total) by mouth daily with breakfast. For anxiety 10/23/18   Ursula Alert, MD  cyclobenzaprine (FLEXERIL) 5 MG tablet Take 1-2 tablets 3 times daily as needed 05/21/20   Laban Emperor, PA-C  diclofenac sodium (VOLTAREN) 1 % GEL Apply 4 g topically 4 (four) times daily. 07/30/18   Johnson, Megan P, DO  diphenhydrAMINE (BENADRYL) 25 MG tablet Take 25 mg by mouth daily as needed for allergies.    [provider]  hydrOXYzine (VISTARIL) 25 MG capsule Take 1 capsule (25 mg total) by mouth 2 (two) times daily as needed (severe anxiety symptoms). 10/23/18   Ursula Alert, MD  naproxen (NAPROSYN) 500 MG tablet Take 1 tablet (500 mg total) by mouth 2 (two) times daily with a meal. 11/27/18  Johnson, Megan P, DO  omeprazole (PRILOSEC) 20 MG capsule Take 1 capsule (20 mg total) by mouth daily. 09/26/18   Johnson, Megan P, DO  ondansetron (ZOFRAN ODT) 4 MG disintegrating tablet Allow 1-2 tablets to dissolve in your mouth every 8 hours as needed for nausea/vomiting 03/26/20   Hinda Kehr, MD  oxyCODONE-acetaminophen (PERCOCET/ROXICET) 5-325 MG tablet Take 1 tablet by mouth every 4 (four) hours as needed for severe pain. 03/16/20   Paulette Blanch, MD  propranolol (INDERAL) 10 MG tablet TAKE 1 TABLET BY MOUTH THREE TIMES DAILY AS NEEDED FOR SEVERE ANXIETY SYMPTOMS 09/11/18   [provider]  traZODone (DESYREL) 150 MG tablet Take 1 tablet (150 mg total) by mouth at bedtime. 09/23/18   Ursula Alert, MD  Vitamin D, Ergocalciferol, (DRISDOL) 50000 units CAPS capsule Take 1 capsule (50,000 Units total) by mouth every 7 (seven) days. 07/31/18   Park Liter P, DO    Allergies Bee venom, Contrast media [iodinated diagnostic agents], Eggs or egg-derived products, Other, Penicillins, Wellbutrin [bupropion], Latex, and Oxycodone-acetaminophen  Family History  Problem Relation Age of Onset  . Bell's palsy Mother   . Heart failure Mother   . Hypertension Mother   . Lupus Mother   . Anxiety disorder Mother   . Depression Mother   . Stroke Mother   . Asthma Father   . Hypertension Father   . Hyperlipidemia Father   . Anxiety disorder Father   . Depression Father   . Hypertension Maternal Grandmother   . Diabetes Maternal Grandmother   . Sarcoidosis Maternal Grandmother   . Anxiety disorder Sister   . Depression Sister   . Bipolar disorder Sister   . Depression Son   . Bipolar disorder Son     Social History Social History   Tobacco Use  . Smoking status: Never Smoker  . Smokeless tobacco: Never Used  Substance Use Topics  . Alcohol use: Not Currently  . Drug use: No     Review of Systems  Cardiovascular: No chest pain. Respiratory:  No SOB. Gastrointestinal: No nausea, no vomiting.  Genitourinary: Negative for dysuria. Musculoskeletal: Positive for leg pain. Skin: Negative for rash, abrasions, lacerations, ecchymosis. Neurological: Negative for headaches, numbness or tingling   ____________________________________________   PHYSICAL EXAM:  VITAL SIGNS: ED Triage Vitals  Enc Vitals Group     BP 05/21/20 1050 (!) 139/97     Pulse Rate 05/21/20 1050 74     Resp 05/21/20 1050 16     Temp 05/21/20 1050 98.2 F (36.8 C)     Temp Source 05/21/20 1050 Oral     SpO2 05/21/20 1050 96 %     Weight 05/21/20 1049 (!) 323 lb (146.5 kg)     Height 05/21/20 1049 5\' 6"  (1.676 m)     Head Circumference --      Peak Flow --      Pain Score 05/21/20 1055 6      Pain Loc --      Pain Edu? --      Excl. in [place]? --      Constitutional: Alert and oriented. Well appearing and in no acute distress. Eyes: Conjunctivae are normal. PERRL. EOMI. Head: Atraumatic. ENT:      Ears:      Nose: No congestion/rhinnorhea.      Mouth/Throat: Mucous membranes are moist.  Neck: No stridor.   Cardiovascular: Normal rate, regular rhythm.  Good peripheral circulation. Respiratory: Normal respiratory effort without tachypnea or retractions.  Lungs CTAB. Good air entry to the bases with no decreased or absent breath sounds. Gastrointestinal: Bowel sounds 4 quadrants. Soft and nontender to palpation. No guarding or rigidity. No palpable masses. No distention.  Musculoskeletal: Full range of motion to all extremities. No gross deformities appreciated. To palpation to lumbar spine or lumbar paraspinal muscles. Strength equal in lower extremities bilaterally. Normal gait. Neurologic:  Normal speech and language. No gross focal neurologic deficits are appreciated.  Skin:  Skin is warm, dry and intact. No rash noted. Psychiatric: Mood and affect are normal. Speech and behavior are normal. Patient exhibits appropriate insight and judgement.   ____________________________________________   LABS (all labs ordered are listed, but only abnormal results are displayed)  Labs Reviewed - No data to display ____________________________________________  EKG   ____________________________________________  RADIOLOGY Robinette Haines, personally viewed and evaluated these images (plain radiographs) as part of my medical decision making, as well as reviewing the written report by the radiologist.  US Venous Img Lower Unilateral Right  Result Date: 05/21/2020 CLINICAL DATA:  RIGHT thigh pain, history DVT EXAM: RIGHT LOWER EXTREMITY VENOUS DOPPLER ULTRASOUND TECHNIQUE: Gray-scale sonography with compression, as well as color and duplex ultrasound, were performed to evaluate the  deep venous system(s) from the level of the common femoral vein through the popliteal and proximal calf veins. COMPARISON:  None FINDINGS: VENOUS Normal compressibility of the common femoral, superficial femoral, and popliteal veins, as well as the visualized calf veins. Visualized portions of profunda femoral vein and great saphenous vein unremarkable. No filling defects to suggest DVT on grayscale or color Doppler imaging. Doppler waveforms show normal direction of venous flow, normal respiratory plasticity and response to augmentation. Contralateral common femoral vein is patent and compressible. OTHER Minimal subcutaneous edema identified at site of pain at the posterolateral RIGHT mid thigh Limitations: none IMPRESSION: No evidence of deep venous thrombosis in the RIGHT lower extremity. Electronically Signed   By: Lavonia Dana M.D.   On: 05/21/2020 12:02    ____________________________________________    PROCEDURES  Procedure(s) performed:    Procedures    Medications - No data to display   ____________________________________________   INITIAL IMPRESSION / ASSESSMENT AND PLAN / ED COURSE  Pertinent labs & imaging results that were available during my care of the patient were reviewed by me and considered in my medical decision making (see chart for details).  Review of the [place] CSRS was performed in accordance of the [place] prior to dispensing any controlled drugs.   Patient's diagnosis is consistent with lumbar radiculopathy. Vital signs and exam are reassuring. Ultrasound negative for DVT. Patient will be discharged home with prescriptions for Flexeril. She will also take Mobic when she is home. Patient is to follow up with primary care orthopedics as directed. Patient is given ED precautions to return to the ED for any worsening or new symptoms.  Manaal Mandala was evaluated in Emergency Department on 05/21/2020 for the symptoms described in the history of present illness. She was  evaluated in the context of the global COVID-19 pandemic, which necessitated consideration that the patient might be at risk for infection with the SARS-CoV-2 virus that causes COVID-19. Institutional protocols and algorithms that pertain to the evaluation of patients at risk for COVID-19 are in a state of rapid change based on information released by regulatory bodies including the CDC and federal and state organizations. These policies and algorithms were followed during the patient's care in the ED.   ____________________________________________  FINAL CLINICAL IMPRESSION(S) /  ED DIAGNOSES  Final diagnoses:  Lumbar radiculopathy      NEW MEDICATIONS STARTED DURING THIS VISIT:  ED Discharge Orders         Ordered    cyclobenzaprine (FLEXERIL) 5 MG tablet     05/21/20 1344              This chart was dictated using voice recognition software/Dragon. Despite best efforts to proofread, errors can occur which can change the meaning. Any change was purely unintentional.    Laban Emperor, PA-C 05/21/20 1527    Arta Silence, MD 05/21/20 [id number]

## 2020-05-21 NOTE — ED Notes (Signed)
Patient is complaining of right leg pain x 2 days.  Patient states she has history of blood clots in right leg and this feels similar.  Patient denies redness/pain/swelling to right leg at this time.

## 2020-05-21 NOTE — ED Triage Notes (Addendum)
Pt states that she started with right calf pain 2 days ago, states hx of blood clots in that leg and states now the pain has radiated up into her right thigh as well. Pt denies swelling redness or heat, reports just pain but due to her history wanted to be safe, states her last blood clot was 2 years ago

## 2020-05-21 NOTE — ED Notes (Signed)
Pt discharged home after verbalizing understanding of discharge instructions; nad noted. 

## 2020-10-03 ENCOUNTER — Emergency Department: Payer: Medicaid Other

## 2020-10-03 ENCOUNTER — Other Ambulatory Visit: Payer: Self-pay

## 2020-10-03 ENCOUNTER — Emergency Department
Admission: EM | Admit: 2020-10-03 | Discharge: 2020-10-03 | Disposition: A | Discharge status: Home / Self Care | Payer: Medicaid Other | Attending: Emergency Medicine | Admitting: Emergency Medicine

## 2020-10-03 DIAGNOSIS — Z79899 Other long term (current) drug therapy: Secondary | ICD-10-CM | POA: Insufficient documentation

## 2020-10-03 DIAGNOSIS — S161XXA Strain of muscle, fascia and tendon at neck level, initial encounter: Secondary | ICD-10-CM | POA: Insufficient documentation

## 2020-10-03 DIAGNOSIS — I10 Essential (primary) hypertension: Secondary | ICD-10-CM | POA: Diagnosis not present

## 2020-10-03 DIAGNOSIS — X58XXXA Exposure to other specified factors, initial encounter: Secondary | ICD-10-CM | POA: Insufficient documentation

## 2020-10-03 DIAGNOSIS — J45909 Unspecified asthma, uncomplicated: Secondary | ICD-10-CM | POA: Insufficient documentation

## 2020-10-03 DIAGNOSIS — M549 Dorsalgia, unspecified: Secondary | ICD-10-CM | POA: Diagnosis present

## 2020-10-03 DIAGNOSIS — Z86011 Personal history of benign neoplasm of the brain: Secondary | ICD-10-CM | POA: Diagnosis not present

## 2020-10-03 DIAGNOSIS — Z880 Allergy status to penicillin: Secondary | ICD-10-CM | POA: Insufficient documentation

## 2020-10-03 DIAGNOSIS — Z91040 Latex allergy status: Secondary | ICD-10-CM | POA: Insufficient documentation

## 2020-10-03 DIAGNOSIS — Z91041 Radiographic dye allergy status: Secondary | ICD-10-CM | POA: Diagnosis not present

## 2020-10-03 DIAGNOSIS — T148XXA Other injury of unspecified body region, initial encounter: Secondary | ICD-10-CM

## 2020-10-03 MED ORDER — LIDOCAINE 5 % EX PTCH: 1.0000 | MEDICATED_PATCH | Freq: Two times a day (BID) | CUTANEOUS | 0 refills | Status: AC

## 2020-10-03 MED ORDER — ORPHENADRINE CITRATE 30 MG/ML IJ SOLN
60.0000 mg | Freq: Two times a day (BID) | INTRAMUSCULAR | Status: DC
  Administered 2020-10-03: 60 mg via INTRAMUSCULAR
  Filled 2020-10-03: qty 2

## 2020-10-03 MED ORDER — CYCLOBENZAPRINE HCL 5 MG PO TABS: ORAL_TABLET | ORAL | 0 refills | Status: DC

## 2020-10-03 MED ORDER — KETOROLAC TROMETHAMINE 30 MG/ML IJ SOLN
30.0000 mg | Freq: Once | INTRAMUSCULAR | Status: AC
  Administered 2020-10-03: 30 mg via INTRAMUSCULAR
  Filled 2020-10-03: qty 1

## 2020-10-03 MED ORDER — IBUPROFEN 400 MG PO TABS: 400.0000 mg | ORAL_TABLET | Freq: Four times a day (QID) | ORAL | 0 refills | Status: DC | PRN

## 2020-10-03 MED ORDER — LIDOCAINE 5 % EX PTCH
1.0000 | MEDICATED_PATCH | CUTANEOUS | Status: DC
  Administered 2020-10-03: 1 via TRANSDERMAL
  Filled 2020-10-03: qty 1

## 2020-10-03 NOTE — ED Notes (Signed)
Pt presents to the ED for lower back pain that started approx a week ago. Pt thought it may have been related to gas issues but pt has been taking OTC medication without relief. Denies injury. Denies numbness and tingling. Denies strenous work. Ambulatory to room. Pt is A&Ox4 and NAD

## 2020-10-03 NOTE — ED Triage Notes (Signed)
Pt to ED via POV for upper back pain and neck pain that started a week ago. Denies injury.  Pt ambulatory

## 2020-10-03 NOTE — ED Provider Notes (Signed)
[facility] Emergency Department Provider Note  ____________________________________________  Time seen: Approximately 1:22 PM  I have reviewed the triage vital signs and the nursing notes.   HISTORY  Chief Complaint Back Pain    HPI Carol Ortega is a 49 y.o. female that presents to the emergency department for evaluation of neck pain that radiates into her shoulders for 1 week.  Patient states that pain starts in her neck and earlier this week radiated into her left shoulder.  She has no longer having pain in her left shoulder.  Now pain is radiating into her right shoulder.  Pain is worse when she moves her head.  She feels that her muscles are stiff and that she cannot straighten up.  Patient thought that maybe she was having gas and has been taking Gas-X.  She has been taking Aleve and applying warm compresses without relief.  No fevers.  No headache, shortness of breath, chest pain.  Past Medical History:  Diagnosis Date  . Anemia   . Anxiety   . Asthma    seasonal  . Benign brain tumor ([place])   . Brain tumor ([place]) 2014   tx with radiation  . Complication of anesthesia 1999   lung collapse after surgery for gallbladder  . Depression   . Ectopic pregnancy   . Headache    migraines  . Hoarseness of voice   . Hypertension   . Kidney stone   . Obesity   . Parathyroid abnormality ([place])   . Pulmonary emboli ([place]) 2011  . Pulmonary embolism ([place])   . Radiation    Brain tumor  . UTI (urinary tract infection)   . Vitamin D deficiency     Patient Active Problem List   Diagnosis Date Noted  . Insomnia 07/30/2018  . Chronic pain of both knees 07/30/2018  . Morbid obesity due to excess calories ([place]) 05/09/2018  . Depression, recurrent ([place]) 05/09/2018  . GAD (generalized anxiety disorder) 05/09/2018  . DVT (deep venous thrombosis) ([place]) 04/15/2018  . Bladder spasms 03/12/2018  . Ureteral stone with hydronephrosis 02/28/2018  . Kidney stone  04/09/2017  . H/O pulmonary embolus during pregnancy 08/28/2016  . S/P laparoscopic cholecystectomy 08/28/2016  . Hyperparathyroidism ([place]) 09/10/2015  . Vitamin D deficiency 06/25/2014  . Hypercalcemia 02/09/2014  . Essential hypertension 01/30/2014  . Hyperlipidemia 01/30/2014  . 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 Adult body mass index 50.0-59.9 ([place]) 03/25/2013  . Anxiety state 03/25/2013  . Asthma 03/25/2013  . Obstructive sleep apnea 03/25/2013  . Personal history of venous thrombosis and embolism 03/25/2013  . 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 Brain tumor ([place]) 12/18/2012  . 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 CHOLECYSTECTOMY  1999  . CYSTOSCOPY W/ RETROGRADES Left 03/01/2018   Procedure: CYSTOSCOPY WITH RETROGRADE PYELOGRAM;  Surgeon: Abbie Sons, MD;  Location: [facility];  Service: Urology;  Laterality: Left;  . CYSTOSCOPY W/ URETERAL STENT PLACEMENT Right 04/08/2017   Procedure: CYSTOSCOPY WITH RETROGRADE PYELOGRAM/URETERAL STENT PLACEMENT;  Surgeon: Rana Snare, MD;  Location: [facility];  Service: Urology;  Laterality: Right;  . CYSTOSCOPY W/ URETERAL STENT PLACEMENT Left 03/12/2018   Procedure: CYSTOSCOPY WITH STENT REPLACEMENT;  Surgeon: Abbie Sons, MD;  Location: [facility];  Service: Urology;  Laterality: Left;  . CYSTOSCOPY W/ URETEROSCOPY    . CYSTOSCOPY WITH RETROGRADE PYELOGRAM, URETEROSCOPY AND STENT PLACEMENT Left 01/20/2014   Procedure: LEFT URETEROSCOPY WITH STENT PLACEMENT;  Surgeon: Irine Seal, MD;  Location: [facility];  Service: Urology;  Laterality: Left;  . CYSTOSCOPY WITH RETROGRADE PYELOGRAM, URETEROSCOPY AND STENT PLACEMENT Bilateral 04/23/2015   Procedure: CYSTOSCOPY WITH BILATERAL RETROGRADE PYELOGRAM, URETEROSCOPY AND STENT PLACEMENT;  Surgeon: Alexis Frock, MD;  Location: [facility];  Service: Urology;  Laterality: Bilateral;  . CYSTOSCOPY WITH RETROGRADE PYELOGRAM, URETEROSCOPY AND  STENT PLACEMENT Right 04/13/2017   Procedure: CYSTOSCOPY WITH RETROGRADE PYELOGRAM, URETEROSCOPY AND STENT EXCHANGE;  Surgeon: Alexis Frock, MD;  Location: [facility];  Service: Urology;  Laterality: Right;  . CYSTOSCOPY WITH STENT PLACEMENT Left 01/12/2014   Procedure: CYSTOSCOPY WITH STENT PLACEMENT left retrograde;  Surgeon: Irine Seal, MD;  Location: [facility];  Service: Urology;  Laterality: Left;  . CYSTOSCOPY WITH STENT PLACEMENT Left 03/01/2018   Procedure: CYSTOSCOPY WITH STENT PLACEMENT;  Surgeon: Abbie Sons, MD;  Location: [facility];  Service: Urology;  Laterality: Left;  . CYSTOSCOPY/URETEROSCOPY/HOLMIUM LASER/STENT PLACEMENT Left 03/12/2018   Procedure: CYSTOSCOPY/URETEROSCOPY/HOLMIUM LASER;  Surgeon: Abbie Sons, MD;  Location: [facility];  Service: Urology;  Laterality: Left;  . HOLMIUM LASER APPLICATION Left 08/20/8181   Procedure: HOLMIUM LASER APPLICATION;  Surgeon: Irine Seal, MD;  [facility]: [facility];  Service: Urology;  Laterality: Left;  . HOLMIUM LASER APPLICATION Bilateral 08/27/3715   Procedure: HOLMIUM LASER APPLICATION;  Surgeon: Alexis Frock, MD;  Location: [facility];  Service: Urology;  Laterality: Bilateral;  . HOLMIUM LASER APPLICATION Right 9/67/8938   Procedure: HOLMIUM LASER APPLICATION;  Surgeon: Alexis Frock, MD;  Location: [facility];  Service: Urology;  Laterality: Right;  . kidney stone removal    . LITHOTRIPSY    . PARATHYROIDECTOMY Right 11/13/2016   Procedure: RIGHT SUPERIOR PARATHYROIDECTOMY;  Surgeon: Armandina Gemma, MD;  Location: [facility];  Service: General;  Laterality: Right;  . surgery for,ectopic pregnancy  2011    1 fallopian tube rupture  . TUBAL LIGATION  2011    Prior to Admission medications   Medication Sig Start Date End Date Taking? Authorizing Provider  acetaminophen (TYLENOL) 500 MG tablet Take 500 mg by mouth every 6 (six) hours as needed.    [provider]  albuterol (PROVENTIL HFA;VENTOLIN HFA) 108 (90 Base) MCG/ACT inhaler Inhale 2  puffs into the lungs every 6 (six) hours as needed for wheezing or shortness of breath. 03/04/18   Loletha Grayer, MD  albuterol (PROVENTIL) (2.5 MG/3ML) 0.083% nebulizer solution Take 3 mLs (2.5 mg total) by nebulization every 6 (six) hours as needed for wheezing or shortness of breath. 03/04/18   Loletha Grayer, MD  ARIPiprazole (ABILIFY) 10 MG tablet Take 1 tablet (10 mg total) by mouth daily. For mood 09/23/18   Ursula Alert, MD  citalopram (CELEXA) 10 MG tablet Take 1 tablet (10 mg total) by mouth daily with breakfast. For anxiety 10/23/18   Ursula Alert, MD  cyclobenzaprine (FLEXERIL) 5 MG tablet Take 1-2 tablets 3 times daily as needed 10/03/20   Laban Emperor, PA-C  diclofenac sodium (VOLTAREN) 1 % GEL Apply 4 g topically 4 (four) times daily. 07/30/18   Johnson, Megan P, DO  diphenhydrAMINE (BENADRYL) 25 MG tablet Take 25 mg by mouth daily as needed for allergies.    [provider]  hydrOXYzine (VISTARIL) 25 MG capsule Take 1 capsule (25 mg total) by mouth 2 (two) times daily as needed (severe anxiety symptoms). 10/23/18   Ursula Alert, MD  ibuprofen (ADVIL) 400 MG tablet Take 1 tablet (400 mg total) by mouth every 6 (six) hours as needed. 10/03/20   Earleen Newport,  Caryl Pina, PA-C  lidocaine (LIDODERM) 5 % Place 1 patch onto the skin every 12 (twelve) hours. Remove & Discard patch within 12 hours or as directed by MD 10/03/20 10/03/21  Laban Emperor, PA-C  naproxen (NAPROSYN) 500 MG tablet Take 1 tablet (500 mg total) by mouth 2 (two) times daily with a meal. 11/27/18   Johnson, Megan P, DO  omeprazole (PRILOSEC) 20 MG capsule Take 1 capsule (20 mg total) by mouth daily. 09/26/18   Johnson, Megan P, DO  ondansetron (ZOFRAN ODT) 4 MG disintegrating tablet Allow 1-2 tablets to dissolve in your mouth every 8 hours as needed for nausea/vomiting 03/26/20   Hinda Kehr, MD  oxyCODONE-acetaminophen (PERCOCET/ROXICET) 5-325 MG tablet Take 1 tablet by mouth every 4 (four) hours as needed for  severe pain. 03/16/20   Paulette Blanch, MD  propranolol (INDERAL) 10 MG tablet TAKE 1 TABLET BY MOUTH THREE TIMES DAILY AS NEEDED FOR SEVERE ANXIETY SYMPTOMS 09/11/18   [provider]  traZODone (DESYREL) 150 MG tablet Take 1 tablet (150 mg total) by mouth at bedtime. 09/23/18   Ursula Alert, MD  Vitamin D, Ergocalciferol, (DRISDOL) 50000 units CAPS capsule Take 1 capsule (50,000 Units total) by mouth every 7 (seven) days. 07/31/18   Park Liter P, DO    Allergies Bee venom, Contrast media [iodinated diagnostic agents], Eggs or egg-derived products, Other, Penicillins, Wellbutrin [bupropion], Latex, and Oxycodone-acetaminophen  Family History  Problem Relation Age of Onset  . Bell's palsy Mother   . Heart failure Mother   . Hypertension Mother   . Lupus Mother   . Anxiety disorder Mother   . Depression Mother   . Stroke Mother   . Asthma Father   . Hypertension Father   . Hyperlipidemia Father   . Anxiety disorder Father   . Depression Father   . Hypertension Maternal Grandmother   . Diabetes Maternal Grandmother   . Sarcoidosis Maternal Grandmother   . Anxiety disorder Sister   . Depression Sister   . Bipolar disorder Sister   . Depression Son   . Bipolar disorder Son     Social History Social History   Tobacco Use  . Smoking status: Never Smoker  . Smokeless tobacco: Never Used  Vaping Use  . Vaping Use: Never used  Substance Use Topics  . Alcohol use: Not Currently  . Drug use: No     Review of Systems  Constitutional: No fever/chills Cardiovascular: No chest pain. Respiratory: No SOB. Gastrointestinal: No abdominal pain.  No nausea, no vomiting.  Musculoskeletal: Positive for neck pain. Skin: Negative for rash, abrasions, lacerations, ecchymosis. Neurological: Negative for headaches, numbness or tingling   ____________________________________________   PHYSICAL EXAM:  VITAL SIGNS: ED Triage Vitals  Enc Vitals Group     BP 10/03/20 1221  (!) 133/102     Pulse Rate 10/03/20 1221 86     Resp 10/03/20 1221 20     Temp 10/03/20 1221 98.5 F (36.9 C)     Temp Source 10/03/20 1221 Oral     SpO2 10/03/20 1221 97 %     Weight 10/03/20 1222 (!) 320 lb (145.2 kg)     Height 10/03/20 1222 5\' 6"  (1.676 m)     Head Circumference --      Peak Flow --      Pain Score 10/03/20 1222 10     Pain Loc --      Pain Edu? --      Excl. in [place]? --  Constitutional: Alert and oriented. Well appearing and in no acute distress. Eyes: Conjunctivae are normal. PERRL. EOMI. Head: Atraumatic. ENT:      Ears:      Nose: No congestion/rhinnorhea.      Mouth/Throat: Mucous membranes are moist.  Neck: No stridor.  Cervical spine tenderness to palpation.  Pain elicited with range of motion of neck.  Strength equal in upper extremities bilaterally. Cardiovascular: Normal rate, regular rhythm.  Good peripheral circulation.  Symmetric sSymmetric radial pulses bilaterally.   Respiratory: Normal respiratory effort without tachypnea or retractions. Lungs CTAB. Good air entry to the bases with no decreased or absent breath sounds. Musculoskeletal: Full range of motion to all extremities. No gross deformities appreciated.  Tenderness to palpation to anterior right shoulder.  Full range of motion of right shoulder without pain. Neurologic:  Normal speech and language. No gross focal neurologic deficits are appreciated.  Skin:  Skin is warm, dry and intact. No rash noted. Psychiatric: Mood and affect are normal. Speech and behavior are normal. Patient exhibits appropriate insight and judgement.   ____________________________________________   LABS (all labs ordered are listed, but only abnormal results are displayed)  Labs Reviewed - No data to display ____________________________________________  EKG   ____________________________________________  RADIOLOGY Robinette Haines, personally viewed and evaluated these images (plain radiographs) as  part of my medical decision making, as well as reviewing the written report by the radiologist.  DG Cervical Spine 2-3 Views  Result Date: 10/03/2020 CLINICAL DATA:  Posterior neck pain for 1 week EXAM: CERVICAL SPINE - 2-3 VIEW COMPARISON:  None. FINDINGS: Mild straightening of the normal cervical lordosis. No fracture, subluxation, or acute bony findings. Preserved intervertebral disc spaces in the cervical spine. No prevertebral soft tissue swelling. IMPRESSION: 1. No acute bony findings. 2. Mild straightening of the normal cervical lordosis. Electronically Signed   By: Van Clines M.D.   On: 10/03/2020 14:28    ____________________________________________    PROCEDURES  Procedure(s) performed:    Procedures    Medications  orphenadrine (NORFLEX) injection 60 mg (60 mg Intramuscular Given 10/03/20 1439)  lidocaine (LIDODERM) 5 % 1 patch (1 patch Transdermal Patch Applied 10/03/20 1435)  ketorolac (TORADOL) 30 MG/ML injection 30 mg (30 mg Intramuscular Given 10/03/20 1439)     ____________________________________________   INITIAL IMPRESSION / ASSESSMENT AND PLAN / ED COURSE  Pertinent labs & imaging results that were available during my care of the patient were reviewed by me and considered in my medical decision making (see chart for details).  Review of the [place] CSRS was performed in accordance of the [place] prior to dispensing any controlled drugs.   Patient's diagnosis is consistent with muscle strain.  Vital signs and exam are reassuring.  X-ray consistent with mild straightening of the normal cervical lordosis, consistent with muscle strain.  Patient was given IM Norflex and Toradol for pain with relief of symptoms.  She feels that her muscles are more loose now and she is able to move easier.  Her cousin dropped her off in the ED.  Patient will be discharged home with prescriptions for Flexeril, Lidoderm, Motrin. Patient is to follow up with primary care as  directed. Patient is given ED precautions to return to the ED for any worsening or new symptoms.   Carol Ortega was evaluated in Emergency Department on 10/03/2020 for the symptoms described in the history of present illness. She was evaluated in the context of the global COVID-19 pandemic, which necessitated consideration that the patient  might be at risk for infection with the SARS-CoV-2 virus that causes COVID-19. Institutional protocols and algorithms that pertain to the evaluation of patients at risk for COVID-19 are in a state of rapid change based on information released by regulatory bodies including the CDC and federal and state organizations. These policies and algorithms were followed during the patient's care in the ED.  ____________________________________________  FINAL CLINICAL IMPRESSION(S) / ED DIAGNOSES  Final diagnoses:  Muscle strain      NEW MEDICATIONS STARTED DURING THIS VISIT:  ED Discharge Orders         Ordered    cyclobenzaprine (FLEXERIL) 5 MG tablet        10/03/20 1542    lidocaine (LIDODERM) 5 %  Every 12 hours        10/03/20 1542    ibuprofen (ADVIL) 400 MG tablet  Every 6 hours PRN        10/03/20 1542              This chart was dictated using voice recognition software/Dragon. Despite best efforts to proofread, errors can occur which can change the meaning. Any change was purely unintentional.    Laban Emperor, PA-C 10/03/20 1844    Blake Divine, MD 10/04/20 1729

## 2021-05-23 ENCOUNTER — Encounter: Payer: Self-pay | Admitting: Emergency Medicine

## 2021-05-23 ENCOUNTER — Emergency Department: Payer: Medicaid Other

## 2021-05-23 ENCOUNTER — Emergency Department
Admission: EM | Admit: 2021-05-23 | Discharge: 2021-05-23 | Disposition: A | Discharge status: Home / Self Care | Payer: Medicaid Other | Attending: Emergency Medicine | Admitting: Emergency Medicine

## 2021-05-23 ENCOUNTER — Other Ambulatory Visit: Payer: Self-pay

## 2021-05-23 DIAGNOSIS — J45909 Unspecified asthma, uncomplicated: Secondary | ICD-10-CM | POA: Diagnosis not present

## 2021-05-23 DIAGNOSIS — Z91040 Latex allergy status: Secondary | ICD-10-CM | POA: Insufficient documentation

## 2021-05-23 DIAGNOSIS — Z86011 Personal history of benign neoplasm of the brain: Secondary | ICD-10-CM | POA: Diagnosis not present

## 2021-05-23 DIAGNOSIS — M79652 Pain in left thigh: Secondary | ICD-10-CM | POA: Insufficient documentation

## 2021-05-23 DIAGNOSIS — M25552 Pain in left hip: Secondary | ICD-10-CM | POA: Diagnosis present

## 2021-05-23 DIAGNOSIS — I10 Essential (primary) hypertension: Secondary | ICD-10-CM | POA: Insufficient documentation

## 2021-05-23 MED ORDER — HYDROMORPHONE HCL 1 MG/ML IJ SOLN
1.0000 mg | Freq: Once | INTRAMUSCULAR | Status: AC
  Administered 2021-05-23: 1 mg via INTRAMUSCULAR
  Filled 2021-05-23: qty 1

## 2021-05-23 MED ORDER — ORPHENADRINE CITRATE 30 MG/ML IJ SOLN
60.0000 mg | Freq: Two times a day (BID) | INTRAMUSCULAR | Status: DC
  Administered 2021-05-23: 60 mg via INTRAMUSCULAR
  Filled 2021-05-23: qty 2

## 2021-05-23 MED ORDER — OXYCODONE-ACETAMINOPHEN 7.5-325 MG PO TABS: 1.0000 | ORAL_TABLET | ORAL | 0 refills | Status: DC | PRN

## 2021-05-23 MED ORDER — ORPHENADRINE CITRATE ER 100 MG PO TB12: 100.0000 mg | ORAL_TABLET | Freq: Two times a day (BID) | ORAL | 0 refills | Status: DC

## 2021-05-23 NOTE — ED Notes (Signed)
Patient declined discharge vital signs. 

## 2021-05-23 NOTE — ED Provider Notes (Signed)
[facility] Emergency Department Provider Note   ____________________________________________   Event Date/Time   First MD Initiated Contact with Patient 05/23/21 1414     (approximate)  I have reviewed the triage vital signs and the nursing notes.   HISTORY  Chief Complaint Back Pain    HPI Carol Ortega is a 50 y.o. female patient presents with aute onset of left hip pain that radiates to mid posterior left thigh.  Patient pain increased with any movement.  Incident occurred last night while trying to go to sleep.  Patient denies chest pain or dyspnea.  Patient has a history of DVT to the right lower extremity.  Patient states this pain does not feel like her previous DVT.         Past Medical History:  Diagnosis Date  . Anemia   . Anxiety   . Asthma    seasonal  . Benign brain tumor ([place])   . Brain tumor ([place]) 2014   tx with radiation  . Complication of anesthesia 1999   lung collapse after surgery for gallbladder  . Depression   . Ectopic pregnancy   . Headache    migraines  . Hoarseness of voice   . Hypertension   . Kidney stone   . Obesity   . Parathyroid abnormality ([place])   . Pulmonary emboli ([place]) 2011  . Pulmonary embolism ([place])   . Radiation    Brain tumor  . UTI (urinary tract infection)   . Vitamin D deficiency     Patient Active Problem List   Diagnosis Date Noted  . Insomnia 07/30/2018  . Chronic pain of both knees 07/30/2018  . Morbid obesity due to excess calories ([place]) 05/09/2018  . Depression, recurrent ([place]) 05/09/2018  . GAD (generalized anxiety disorder) 05/09/2018  . DVT (deep venous thrombosis) ([place]) 04/15/2018  . Bladder spasms 03/12/2018  . Ureteral stone with hydronephrosis 02/28/2018  . Kidney stone 04/09/2017  . H/O pulmonary embolus during pregnancy 08/28/2016  . S/P laparoscopic cholecystectomy 08/28/2016  . Hyperparathyroidism ([place]) 09/10/2015  . Vitamin D deficiency 06/25/2014  .  Hypercalcemia 02/09/2014  . Essential hypertension 01/30/2014  . Hyperlipidemia 01/30/2014  . 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 Adult body mass index 50.0-59.9 ([place]) 03/25/2013  . Anxiety state 03/25/2013  . Asthma 03/25/2013  . Obstructive sleep apnea 03/25/2013  . Personal history of venous thrombosis and embolism 03/25/2013  . 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 Brain tumor ([place]) 12/18/2012  . 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 CHOLECYSTECTOMY  1999  . CYSTOSCOPY W/ RETROGRADES Left 03/01/2018   Procedure: CYSTOSCOPY WITH RETROGRADE PYELOGRAM;  Surgeon: Abbie Sons, MD;  Location: [facility];  Service: Urology;  Laterality: Left;  . CYSTOSCOPY W/ URETERAL STENT PLACEMENT Right 04/08/2017   Procedure: CYSTOSCOPY WITH RETROGRADE PYELOGRAM/URETERAL STENT PLACEMENT;  Surgeon: Rana Snare, MD;  Location: [facility];  Service: Urology;  Laterality: Right;  . CYSTOSCOPY W/ URETERAL STENT PLACEMENT Left 03/12/2018   Procedure: CYSTOSCOPY WITH STENT REPLACEMENT;  Surgeon: Abbie Sons, MD;  Location: [facility];  Service: Urology;  Laterality: Left;  . CYSTOSCOPY W/ URETEROSCOPY    . CYSTOSCOPY WITH RETROGRADE PYELOGRAM, URETEROSCOPY AND STENT PLACEMENT Left 01/20/2014   Procedure: LEFT URETEROSCOPY WITH STENT PLACEMENT;  Surgeon: Irine Seal, MD;  Location: [facility];  Service: Urology;  Laterality: Left;  . CYSTOSCOPY WITH RETROGRADE PYELOGRAM, URETEROSCOPY AND STENT PLACEMENT Bilateral 04/23/2015   Procedure: CYSTOSCOPY  WITH BILATERAL RETROGRADE PYELOGRAM, URETEROSCOPY AND STENT PLACEMENT;  Surgeon: Alexis Frock, MD;  Location: [facility];  Service: Urology;  Laterality: Bilateral;  . CYSTOSCOPY WITH RETROGRADE PYELOGRAM, URETEROSCOPY AND STENT PLACEMENT Right 04/13/2017   Procedure: CYSTOSCOPY WITH RETROGRADE PYELOGRAM, URETEROSCOPY AND STENT EXCHANGE;  Surgeon: Alexis Frock, MD;  Location: [facility];  Service: Urology;   Laterality: Right;  . CYSTOSCOPY WITH STENT PLACEMENT Left 01/12/2014   Procedure: CYSTOSCOPY WITH STENT PLACEMENT left retrograde;  Surgeon: Irine Seal, MD;  Location: [facility];  Service: Urology;  Laterality: Left;  . CYSTOSCOPY WITH STENT PLACEMENT Left 03/01/2018   Procedure: CYSTOSCOPY WITH STENT PLACEMENT;  Surgeon: Abbie Sons, MD;  Location: [facility];  Service: Urology;  Laterality: Left;  . CYSTOSCOPY/URETEROSCOPY/HOLMIUM LASER/STENT PLACEMENT Left 03/12/2018   Procedure: CYSTOSCOPY/URETEROSCOPY/HOLMIUM LASER;  Surgeon: Abbie Sons, MD;  Location: [facility];  Service: Urology;  Laterality: Left;  . HOLMIUM LASER APPLICATION Left 07/24/5642   Procedure: HOLMIUM LASER APPLICATION;  Surgeon: Irine Seal, MD;  Location: [facility];  Service: Urology;  Laterality: Left;  . HOLMIUM LASER APPLICATION Bilateral 02/16/9517   Procedure: HOLMIUM LASER APPLICATION;  Surgeon: Alexis Frock, MD;  Location: [facility];  Service: Urology;  Laterality: Bilateral;  . HOLMIUM LASER APPLICATION Right 8/41/6606   Procedure: HOLMIUM LASER APPLICATION;  Surgeon: Alexis Frock, MD;  Location: [facility];  Service: Urology;  Laterality: Right;  . kidney stone removal    . LITHOTRIPSY    . PARATHYROIDECTOMY Right 11/13/2016   Procedure: RIGHT SUPERIOR PARATHYROIDECTOMY;  Surgeon: Armandina Gemma, MD;  Location: [place];  Service: General;  Laterality: Right;  . surgery for,ectopic pregnancy  2011    1 fallopian tube rupture  . TUBAL LIGATION  2011    Prior to Admission medications   Medication Sig Start Date End Date Taking? Authorizing Provider  orphenadrine (NORFLEX) 100 MG tablet Take 1 tablet (100 mg total) by mouth 2 (two) times daily. 05/23/21  Yes Sable Feil, PA-C  oxyCODONE-acetaminophen (PERCOCET) 7.5-325 MG tablet Take 1 tablet by mouth every 4 (four) hours as needed for severe pain. 05/23/21 05/23/22 Yes Sable Feil, PA-C  acetaminophen (TYLENOL) 500 MG tablet Take 500 mg by mouth every 6 (six) hours as  needed.    [provider]  albuterol (PROVENTIL HFA;VENTOLIN HFA) 108 (90 Base) MCG/ACT inhaler Inhale 2 puffs into the lungs every 6 (six) hours as needed for wheezing or shortness of breath. 03/04/18   Loletha Grayer, MD  albuterol (PROVENTIL) (2.5 MG/3ML) 0.083% nebulizer solution Take 3 mLs (2.5 mg total) by nebulization every 6 (six) hours as needed for wheezing or shortness of breath. 03/04/18   Loletha Grayer, MD  ARIPiprazole (ABILIFY) 10 MG tablet Take 1 tablet (10 mg total) by mouth daily. For mood 09/23/18   Ursula Alert, MD  citalopram (CELEXA) 10 MG tablet Take 1 tablet (10 mg total) by mouth daily with breakfast. For anxiety 10/23/18   Ursula Alert, MD  cyclobenzaprine (FLEXERIL) 5 MG tablet Take 1-2 tablets 3 times daily as needed 10/03/20   Laban Emperor, PA-C  diclofenac sodium (VOLTAREN) 1 % GEL Apply 4 g topically 4 (four) times daily. 07/30/18   Johnson, Megan P, DO  diphenhydrAMINE (BENADRYL) 25 MG tablet Take 25 mg by mouth daily as needed for allergies.    [provider]  hydrOXYzine (VISTARIL) 25 MG capsule Take 1 capsule (25 mg total) by mouth 2 (two) times daily as needed (severe anxiety symptoms). 10/23/18   Ursula Alert, MD  ibuprofen (  ADVIL) 400 MG tablet Take 1 tablet (400 mg total) by mouth every 6 (six) hours as needed. 10/03/20   Laban Emperor, PA-C  lidocaine (LIDODERM) 5 % Place 1 patch onto the skin every 12 (twelve) hours. Remove & Discard patch within 12 hours or as directed by MD 10/03/20 10/03/21  Laban Emperor, PA-C  naproxen (NAPROSYN) 500 MG tablet Take 1 tablet (500 mg total) by mouth 2 (two) times daily with a meal. 11/27/18   Johnson, Megan P, DO  omeprazole (PRILOSEC) 20 MG capsule Take 1 capsule (20 mg total) by mouth daily. 09/26/18   Johnson, Megan P, DO  ondansetron (ZOFRAN ODT) 4 MG disintegrating tablet Allow 1-2 tablets to dissolve in your mouth every 8 hours as needed for nausea/vomiting 03/26/20   Hinda Kehr, MD   oxyCODONE-acetaminophen (PERCOCET/ROXICET) 5-325 MG tablet Take 1 tablet by mouth every 4 (four) hours as needed for severe pain. 03/16/20   Paulette Blanch, MD  propranolol (INDERAL) 10 MG tablet TAKE 1 TABLET BY MOUTH THREE TIMES DAILY AS NEEDED FOR SEVERE ANXIETY SYMPTOMS 09/11/18   [provider]  traZODone (DESYREL) 150 MG tablet Take 1 tablet (150 mg total) by mouth at bedtime. 09/23/18   Ursula Alert, MD  Vitamin D, Ergocalciferol, (DRISDOL) 50000 units CAPS capsule Take 1 capsule (50,000 Units total) by mouth every 7 (seven) days. 07/31/18   Park Liter P, DO    Allergies Bee venom, Contrast media [iodinated diagnostic agents], Eggs or egg-derived products, Other, Penicillins, Wellbutrin [bupropion], Latex, and Oxycodone-acetaminophen  Family History  Problem Relation Age of Onset  . Bell's palsy Mother   . Heart failure Mother   . Hypertension Mother   . Lupus Mother   . Anxiety disorder Mother   . Depression Mother   . Stroke Mother   . Asthma Father   . Hypertension Father   . Hyperlipidemia Father   . Anxiety disorder Father   . Depression Father   . Hypertension Maternal Grandmother   . Diabetes Maternal Grandmother   . Sarcoidosis Maternal Grandmother   . Anxiety disorder Sister   . Depression Sister   . Bipolar disorder Sister   . Depression Son   . Bipolar disorder Son     Social History Social History   Tobacco Use  . Smoking status: Never Smoker  . Smokeless tobacco: Never Used  Vaping Use  . Vaping Use: Never used  Substance Use Topics  . Alcohol use: Not Currently  . Drug use: No   Review of Systems  Constitutional: No fever/chills Eyes: No visual changes. ENT: No sore throat. Cardiovascular: Denies chest pain. Respiratory: Denies shortness of breath. Gastrointestinal: No abdominal pain.  No nausea, no vomiting.  No diarrhea.  No constipation. Genitourinary: Negative for dysuria. Musculoskeletal: Left hip pain. Skin: Negative for  rash. Neurological: Negative for headaches, focal weakness or numbness. Psychiatric:  Anxiety and depression. Endocrine:  Hypertension lupus. Allergic/Immunilogical: Bee venom, contrast media agent, eggs, penicillin, latex, and Wellbutrin.  ____________________________________________   PHYSICAL EXAM:  VITAL SIGNS: ED Triage Vitals  Enc Vitals Group     BP 05/23/21 1257 133/85     Pulse Rate 05/23/21 1257 87     Resp 05/23/21 1257 20     Temp 05/23/21 1257 98.2 F (36.8 C)     Temp Source 05/23/21 1257 Oral     SpO2 05/23/21 1257 98 %     Weight 05/23/21 1255 (!) 334 lb (151.5 kg)     Height 05/23/21 1255 5'  6" (1.676 m)     Head Circumference --      Peak Flow --      Pain Score 05/23/21 1255 10     Pain Loc --      Pain Edu? --      Excl. in [place]? --     Constitutional: Alert and oriented. Well appearing and in no acute distress.  BMI is 53.91. Eyes: Conjunctivae are normal. PERRL. EOMI. Head: Atraumatic. Nose: No congestion/rhinnorhea. Mouth/Throat: Mucous membranes are moist.  Oropharynx non-erythematous. Hematological/Lymphatic/Immunilogical: No cervical lymphadenopathy. Cardiovascular: Normal rate, regular rhythm. Grossly normal heart sounds.  Good peripheral circulation. Respiratory: Normal respiratory effort.  No retractions. Lungs CTAB. Gastrointestinal: Soft and nontender. No distention. No abdominal bruits. No CVA tenderness. Genitourinary: Deferred Musculoskeletal: No obvious deformity to the left hip.  No leg length discrepancy.  Patient has full and equal range of motion.  No lower extremity tenderness nor edema.  No joint effusions. Neurologic:  Normal speech and language. No gross focal neurologic deficits are appreciated. No gait instability. Skin:  Skin is warm, dry and intact. No rash noted. Psychiatric: Mood and affect are normal. Speech and behavior are normal.  ____________________________________________   LABS (all labs ordered are listed, but only  abnormal results are displayed)  Labs Reviewed - No data to display ____________________________________________  EKG   ____________________________________________  RADIOLOGY I, Sable Feil, personally viewed and evaluated these images (plain radiographs) as part of my medical decision making, as well as reviewing the written report by the radiologist.  ED MD interpretation: No acute findings x-ray of the left hip.  Official radiology report(s): DG HIP UNILAT WITH PELVIS 2-3 VIEWS LEFT  Result Date: 05/23/2021 CLINICAL DATA:  Acute onset of LEFT hip pain with pain radiating down posterior thigh beginning last night, severe pain with movement EXAM: DG HIP (WITH OR WITHOUT PELVIS) 2-3V LEFT COMPARISON:  None FINDINGS: Hip and SI joint spaces preserved. Osseous mineralization normal. No acute fracture, dislocation, or bone destruction. IMPRESSION: Normal exam. Electronically Signed   By: Lavonia Dana M.D.   On: 05/23/2021 16:05    ____________________________________________   PROCEDURES  Procedure(s) performed (including Critical Care):  Procedures   ____________________________________________   INITIAL IMPRESSION / ASSESSMENT AND PLAN / ED COURSE  As part of my medical decision making, I reviewed the following data within the [place]         Patient complaint of acute onset of left hip pain while lying in bed last night.  Patient denies radicular component to back pain there is no bladder or bowel dysfunction.  Patient denies chest pain or dyspnea.  Discussed no acute findings on x-ray of the left hip.  Patient complaining physical exam consistent with muscle skeletal pain.  Patient given discharge care instruction advised follow-up PCP for continued care.      ____________________________________________   FINAL CLINICAL IMPRESSION(S) / ED DIAGNOSES  Final diagnoses:  Left hip pain     ED Discharge Orders         Ordered    orphenadrine  (NORFLEX) 100 MG tablet  2 times daily        05/23/21 1603    oxyCODONE-acetaminophen (PERCOCET) 7.5-325 MG tablet  Every 4 hours PRN        05/23/21 1603           Note:  This document was prepared using Dragon voice recognition software and may include unintentional dictation errors.    Sable Feil, PA-C  05/23/21 1610    Blake Divine, MD 05/24/21 1047

## 2021-05-23 NOTE — Discharge Instructions (Addendum)
No acute findings on x-ray of the left hip.  Read and follow discharge care instructions take medication as directed.  Advised to follow-up PCP if no improvement in 2 to 3 days.

## 2021-05-23 NOTE — ED Triage Notes (Signed)
Pt to ED via POV with c/o L hip pain, pt states radiates down posterior thigh, pt states pain severe with movement at this time. Pt states pain started last night.

## 2021-05-23 NOTE — ED Notes (Signed)
Call light at bedside

## 2021-05-23 NOTE — ED Notes (Signed)
Patient is at imaging.

## 2021-11-08 ENCOUNTER — Inpatient Hospital Stay
Admission: EM | Admit: 2021-11-08 | Discharge: 2021-11-11 | DRG: 683 | Disposition: A | Discharge status: Home / Self Care | Payer: Medicaid Other | Attending: Hospitalist | Admitting: Hospitalist

## 2021-11-08 ENCOUNTER — Encounter: Payer: Self-pay | Admitting: Emergency Medicine

## 2021-11-08 ENCOUNTER — Emergency Department: Payer: Medicaid Other

## 2021-11-08 ENCOUNTER — Observation Stay: Payer: Medicaid Other

## 2021-11-08 DIAGNOSIS — I10 Essential (primary) hypertension: Secondary | ICD-10-CM | POA: Diagnosis not present

## 2021-11-08 DIAGNOSIS — Z91040 Latex allergy status: Secondary | ICD-10-CM

## 2021-11-08 DIAGNOSIS — F339 Major depressive disorder, recurrent, unspecified: Secondary | ICD-10-CM | POA: Diagnosis not present

## 2021-11-08 DIAGNOSIS — N200 Calculus of kidney: Secondary | ICD-10-CM

## 2021-11-08 DIAGNOSIS — Z28310 Unvaccinated for covid-19: Secondary | ICD-10-CM

## 2021-11-08 DIAGNOSIS — E213 Hyperparathyroidism, unspecified: Secondary | ICD-10-CM | POA: Diagnosis present

## 2021-11-08 DIAGNOSIS — E785 Hyperlipidemia, unspecified: Secondary | ICD-10-CM | POA: Diagnosis not present

## 2021-11-08 DIAGNOSIS — Z6841 Body Mass Index (BMI) 40.0 and over, adult: Secondary | ICD-10-CM

## 2021-11-08 DIAGNOSIS — Z8249 Family history of ischemic heart disease and other diseases of the circulatory system: Secondary | ICD-10-CM

## 2021-11-08 DIAGNOSIS — Z83438 Family history of other disorder of lipoprotein metabolism and other lipidemia: Secondary | ICD-10-CM

## 2021-11-08 DIAGNOSIS — D72829 Elevated white blood cell count, unspecified: Secondary | ICD-10-CM | POA: Diagnosis not present

## 2021-11-08 DIAGNOSIS — R109 Unspecified abdominal pain: Secondary | ICD-10-CM | POA: Diagnosis present

## 2021-11-08 DIAGNOSIS — D496 Neoplasm of unspecified behavior of brain: Secondary | ICD-10-CM | POA: Diagnosis present

## 2021-11-08 DIAGNOSIS — F411 Generalized anxiety disorder: Secondary | ICD-10-CM | POA: Diagnosis not present

## 2021-11-08 DIAGNOSIS — Z885 Allergy status to narcotic agent status: Secondary | ICD-10-CM

## 2021-11-08 DIAGNOSIS — G4733 Obstructive sleep apnea (adult) (pediatric): Secondary | ICD-10-CM | POA: Diagnosis present

## 2021-11-08 DIAGNOSIS — Z833 Family history of diabetes mellitus: Secondary | ICD-10-CM

## 2021-11-08 DIAGNOSIS — Z86011 Personal history of benign neoplasm of the brain: Secondary | ICD-10-CM

## 2021-11-08 DIAGNOSIS — D471 Chronic myeloproliferative disease: Secondary | ICD-10-CM

## 2021-11-08 DIAGNOSIS — N179 Acute kidney failure, unspecified: Principal | ICD-10-CM | POA: Diagnosis present

## 2021-11-08 DIAGNOSIS — Z880 Allergy status to penicillin: Secondary | ICD-10-CM

## 2021-11-08 DIAGNOSIS — Z91030 Bee allergy status: Secondary | ICD-10-CM

## 2021-11-08 DIAGNOSIS — C9210 Chronic myeloid leukemia, BCR/ABL-positive, not having achieved remission: Secondary | ICD-10-CM | POA: Diagnosis present

## 2021-11-08 DIAGNOSIS — Z79890 Hormone replacement therapy (postmenopausal): Secondary | ICD-10-CM

## 2021-11-08 DIAGNOSIS — F32A Depression, unspecified: Secondary | ICD-10-CM | POA: Diagnosis present

## 2021-11-08 DIAGNOSIS — N136 Pyonephrosis: Secondary | ICD-10-CM | POA: Diagnosis present

## 2021-11-08 DIAGNOSIS — Z888 Allergy status to other drugs, medicaments and biological substances status: Secondary | ICD-10-CM

## 2021-11-08 DIAGNOSIS — G4700 Insomnia, unspecified: Secondary | ICD-10-CM | POA: Diagnosis present

## 2021-11-08 DIAGNOSIS — Z20822 Contact with and (suspected) exposure to covid-19: Secondary | ICD-10-CM | POA: Diagnosis present

## 2021-11-08 DIAGNOSIS — Z832 Family history of diseases of the blood and blood-forming organs and certain disorders involving the immune mechanism: Secondary | ICD-10-CM

## 2021-11-08 DIAGNOSIS — K219 Gastro-esophageal reflux disease without esophagitis: Secondary | ICD-10-CM | POA: Diagnosis present

## 2021-11-08 DIAGNOSIS — Z9049 Acquired absence of other specified parts of digestive tract: Secondary | ICD-10-CM

## 2021-11-08 DIAGNOSIS — Z91012 Allergy to eggs: Secondary | ICD-10-CM

## 2021-11-08 DIAGNOSIS — Z86711 Personal history of pulmonary embolism: Secondary | ICD-10-CM

## 2021-11-08 DIAGNOSIS — Z823 Family history of stroke: Secondary | ICD-10-CM

## 2021-11-08 DIAGNOSIS — Z91041 Radiographic dye allergy status: Secondary | ICD-10-CM

## 2021-11-08 DIAGNOSIS — Z825 Family history of asthma and other chronic lower respiratory diseases: Secondary | ICD-10-CM

## 2021-11-08 LAB — COMPREHENSIVE METABOLIC PANEL
ALT: 15 U/L (ref 0–44)
AST: 23 U/L (ref 15–41)
Albumin: 4 g/dL (ref 3.5–5.0)
Alkaline Phosphatase: 76 U/L (ref 38–126)
Anion gap: 8 (ref 5–15)
BUN: 10 mg/dL (ref 6–20)
CO2: 24 mmol/L (ref 22–32)
Calcium: 9 mg/dL (ref 8.9–10.3)
Chloride: 105 mmol/L (ref 98–111)
Creatinine, Ser: 0.76 mg/dL (ref 0.44–1.00)
GFR, Estimated: >60 mL/min (ref >60)
Glucose, Bld: 137 mg/dL — ABNORMAL HIGH (ref 70–99)
Potassium: 3.4 mmol/L — ABNORMAL LOW (ref 3.5–5.1)
Sodium: 137 mmol/L (ref 135–145)
Total Bilirubin: 0.8 mg/dL (ref 0.3–1.2)
Total Protein: 8.5 g/dL — ABNORMAL HIGH (ref 6.5–8.1)

## 2021-11-08 LAB — CBC
HCT: 39.5 % (ref 36.0–46.0)
Hemoglobin: 12.1 g/dL (ref 12.0–15.0)
MCH: 26.7 pg (ref 26.0–34.0)
MCHC: 30.6 g/dL (ref 30.0–36.0)
MCV: 87.2 fL (ref 80.0–100.0)
Platelets: 190 10*3/uL (ref 150–400)
RBC: 4.53 MIL/uL (ref 3.87–5.11)
RDW: 20.4 % — ABNORMAL HIGH (ref 11.5–15.5)
WBC: 138.4 10*3/uL (ref 4.0–10.5)
nRBC: 1.4 % — ABNORMAL HIGH (ref 0.0–0.2)

## 2021-11-08 LAB — CBC WITH DIFFERENTIAL/PLATELET
Abs Immature Granulocytes: 28.51 10*3/uL — ABNORMAL HIGH (ref 0.00–0.07)
Basophils Absolute: 6.2 10*3/uL — ABNORMAL HIGH (ref 0.0–0.1)
Basophils Relative: 5 %
Eosinophils Absolute: 2.5 10*3/uL — ABNORMAL HIGH (ref 0.0–0.5)
Eosinophils Relative: 2 %
HCT: 37.6 % (ref 36.0–46.0)
Hemoglobin: 11.4 g/dL — ABNORMAL LOW (ref 12.0–15.0)
Immature Granulocytes: 21 %
Lymphocytes Relative: 14 %
Lymphs Abs: 18.6 10*3/uL — ABNORMAL HIGH (ref 0.7–4.0)
MCH: 26.7 pg (ref 26.0–34.0)
MCHC: 30.3 g/dL (ref 30.0–36.0)
MCV: 88.1 fL (ref 80.0–100.0)
Monocytes Absolute: 3.9 10*3/uL — ABNORMAL HIGH (ref 0.1–1.0)
Monocytes Relative: 3 %
Neutro Abs: 74 10*3/uL — ABNORMAL HIGH (ref 1.7–7.7)
Neutrophils Relative %: 55 %
Platelets: 176 10*3/uL (ref 150–400)
RBC: 4.27 MIL/uL (ref 3.87–5.11)
RDW: 20.3 % — ABNORMAL HIGH (ref 11.5–15.5)
WBC: 133.7 10*3/uL (ref 4.0–10.5)
nRBC: 1.4 % — ABNORMAL HIGH (ref 0.0–0.2)

## 2021-11-08 LAB — URINALYSIS, ROUTINE W REFLEX MICROSCOPIC
Bilirubin Urine: NEGATIVE
Glucose, UA: NEGATIVE mg/dL
Ketones, ur: NEGATIVE mg/dL
Leukocytes,Ua: NEGATIVE
Nitrite: NEGATIVE
Protein, ur: 30 mg/dL — AB
Specific Gravity, Urine: 1.021 (ref 1.005–1.030)
pH: 5 (ref 5.0–8.0)

## 2021-11-08 LAB — PROTIME-INR
INR: 1.2 (ref 0.8–1.2)
Prothrombin Time: 15.5 seconds — ABNORMAL HIGH (ref 11.4–15.2)

## 2021-11-08 LAB — PHOSPHORUS: Phosphorus: 4.5 mg/dL (ref 2.5–4.6)

## 2021-11-08 LAB — POC URINE PREG, ED: Preg Test, Ur: NEGATIVE

## 2021-11-08 LAB — LACTIC ACID, PLASMA
Lactic Acid, Venous: 1.2 mmol/L (ref 0.5–1.9)
Lactic Acid, Venous: 1 mmol/L (ref 0.5–1.9)

## 2021-11-08 LAB — LACTATE DEHYDROGENASE: LDH: 477 U/L — ABNORMAL HIGH (ref 98–192)

## 2021-11-08 LAB — FIBRINOGEN: Fibrinogen: 378 mg/dL (ref 210–475)

## 2021-11-08 LAB — APTT: aPTT: 33 seconds (ref 24–36)

## 2021-11-08 LAB — TSH: TSH: 1.152 u[IU]/mL (ref 0.350–4.500)

## 2021-11-08 LAB — HIV ANTIBODY (ROUTINE TESTING W REFLEX): HIV Screen 4th Generation wRfx: NONREACTIVE

## 2021-11-08 LAB — URIC ACID: Uric Acid, Serum: 6.6 mg/dL (ref 2.5–7.1)

## 2021-11-08 LAB — PATHOLOGIST SMEAR REVIEW

## 2021-11-08 LAB — RESP PANEL BY RT-PCR (FLU A&B, COVID) ARPGX2
Influenza A by PCR: NEGATIVE
Influenza B by PCR: NEGATIVE
SARS Coronavirus 2 by RT PCR: NEGATIVE

## 2021-11-08 LAB — MAGNESIUM: Magnesium: 2.2 mg/dL (ref 1.7–2.4)

## 2021-11-08 LAB — LIPASE, BLOOD: Lipase: 28 U/L (ref 11–51)

## 2021-11-08 MED ORDER — TAMSULOSIN HCL 0.4 MG PO CAPS
0.4000 mg | ORAL_CAPSULE | Freq: Every day | ORAL | Status: AC
  Administered 2021-11-08 – 2021-11-10 (×3): 0.4 mg via ORAL
  Filled 2021-11-08 (×3): qty 1

## 2021-11-08 MED ORDER — SODIUM CHLORIDE 0.9 % IV SOLN
12.5000 mg | Freq: Three times a day (TID) | INTRAVENOUS | Status: DC | PRN
  Filled 2021-11-08 (×2): qty 0.5

## 2021-11-08 MED ORDER — ACETAMINOPHEN 325 MG PO TABS
650.0000 mg | ORAL_TABLET | Freq: Four times a day (QID) | ORAL | Status: AC | PRN
  Administered 2021-11-09: 650 mg via ORAL
  Filled 2021-11-08: qty 2

## 2021-11-08 MED ORDER — KETOROLAC TROMETHAMINE 30 MG/ML IJ SOLN
30.0000 mg | Freq: Once | INTRAMUSCULAR | Status: AC
  Administered 2021-11-08: 30 mg via INTRAVENOUS
  Filled 2021-11-08: qty 1

## 2021-11-08 MED ORDER — SODIUM CHLORIDE 0.9 % IV SOLN
2.0000 g | INTRAVENOUS | Status: DC
  Administered 2021-11-08 – 2021-11-10 (×3): 2 g via INTRAVENOUS
  Filled 2021-11-08: qty 20
  Filled 2021-11-08: qty 2
  Filled 2021-11-08 (×2): qty 20

## 2021-11-08 MED ORDER — ONDANSETRON HCL 4 MG/2ML IJ SOLN
4.0000 mg | Freq: Once | INTRAMUSCULAR | Status: AC
  Administered 2021-11-08: 4 mg via INTRAVENOUS
  Filled 2021-11-08: qty 2

## 2021-11-08 MED ORDER — ONDANSETRON HCL 4 MG/2ML IJ SOLN: 4.0000 mg | Freq: Four times a day (QID) | INTRAMUSCULAR | Status: DC | PRN

## 2021-11-08 MED ORDER — PROPRANOLOL HCL 10 MG PO TABS
10.0000 mg | ORAL_TABLET | Freq: Three times a day (TID) | ORAL | Status: DC | PRN
  Filled 2021-11-08: qty 1

## 2021-11-08 MED ORDER — ALBUTEROL SULFATE (2.5 MG/3ML) 0.083% IN NEBU: 3.0000 mL | INHALATION_SOLUTION | Freq: Four times a day (QID) | RESPIRATORY_TRACT | Status: DC | PRN

## 2021-11-08 MED ORDER — HYDROMORPHONE HCL 1 MG/ML IJ SOLN
1.0000 mg | INTRAMUSCULAR | Status: DC | PRN
  Administered 2021-11-08 – 2021-11-09 (×3): 1 mg via INTRAVENOUS
  Filled 2021-11-08 (×3): qty 1

## 2021-11-08 MED ORDER — ONDANSETRON HCL 4 MG PO TABS
4.0000 mg | ORAL_TABLET | Freq: Four times a day (QID) | ORAL | Status: DC | PRN
  Administered 2021-11-09 – 2021-11-10 (×2): 4 mg via ORAL
  Filled 2021-11-08 (×4): qty 1

## 2021-11-08 MED ORDER — SODIUM CHLORIDE 0.9 % IV SOLN
12.5000 mg | Freq: Once | INTRAVENOUS | Status: AC
  Administered 2021-11-08: 12.5 mg via INTRAVENOUS
  Filled 2021-11-08: qty 12.5

## 2021-11-08 MED ORDER — POTASSIUM CHLORIDE 10 MEQ/100ML IV SOLN
10.0000 meq | INTRAVENOUS | Status: AC
  Administered 2021-11-08 (×2): 10 meq via INTRAVENOUS
  Filled 2021-11-08 (×2): qty 100

## 2021-11-08 MED ORDER — LACTATED RINGERS IV SOLN: INTRAVENOUS | Status: AC

## 2021-11-08 MED ORDER — PROGESTERONE MICRONIZED 100 MG PO CAPS
100.0000 mg | ORAL_CAPSULE | Freq: Every day | ORAL | Status: DC
  Filled 2021-11-08 (×4): qty 1

## 2021-11-08 MED ORDER — HYDROMORPHONE HCL 1 MG/ML IJ SOLN
1.0000 mg | Freq: Once | INTRAMUSCULAR | Status: AC
  Administered 2021-11-08: 1 mg via INTRAVENOUS
  Filled 2021-11-08: qty 1

## 2021-11-08 MED ORDER — SODIUM CHLORIDE 0.9 % IV BOLUS
500.0000 mL | Freq: Once | INTRAVENOUS | Status: AC
  Administered 2021-11-08: 500 mL via INTRAVENOUS

## 2021-11-08 MED ORDER — ACETAMINOPHEN 650 MG RE SUPP: 650.0000 mg | Freq: Four times a day (QID) | RECTAL | Status: AC | PRN

## 2021-11-08 MED ORDER — MORPHINE SULFATE (PF) 4 MG/ML IV SOLN
4.0000 mg | INTRAVENOUS | Status: DC | PRN
  Administered 2021-11-08: 4 mg via INTRAVENOUS
  Filled 2021-11-08: qty 1

## 2021-11-08 MED ORDER — ENOXAPARIN SODIUM 80 MG/0.8ML IJ SOSY
0.5000 mg/kg | PREFILLED_SYRINGE | INTRAMUSCULAR | Status: DC
  Administered 2021-11-08 – 2021-11-10 (×3): 75 mg via SUBCUTANEOUS
  Filled 2021-11-08 (×4): qty 0.75

## 2021-11-08 MED ORDER — KETOROLAC TROMETHAMINE 30 MG/ML IJ SOLN
15.0000 mg | Freq: Three times a day (TID) | INTRAMUSCULAR | Status: AC | PRN
  Administered 2021-11-08: 21:00:00 15 mg via INTRAVENOUS
  Filled 2021-11-08: qty 1

## 2021-11-08 NOTE — ED Notes (Signed)
Lab called [facility] ct 138.4. EDP notified.

## 2021-11-08 NOTE — ED Notes (Signed)
Informed RN bed assigned 

## 2021-11-08 NOTE — ED Notes (Signed)
Pt taken to CT at this time.

## 2021-11-08 NOTE — Consult Note (Signed)
Hematology/Oncology Consult note [facility] [phone] [phone]  Patient Care Team: Berkley Harvey, NP as PCP - General (Nurse Practitioner)   Name of the patient: Carol Ortega  [id number]  [birth year]    Reason for consult: Leukocytosis   Requesting physician: Dr. Jari Pigg  Date of visit:11/08/2021    History of presenting illness-patient is a 50 year old African-American femaleWho presents to the ER with symptoms of left-sided flank pain.  CT abdomen and pelvis without contrast showed moderate left hydronephrosis secondary to a 1.1 x 0.9 x 1.3 cm calculus at the left ureteropelvic junction/proximal left ureter.  No other evidence of adenopathy and spleen appeared normal.  Incidentally patient was noted to have a white cell count of 138 which on repeat check was 133.  Platelet counts normal and hemoglobin was 12.1.  Last normal CBC was in April 2021 when white count was 7.2.  Presently differential mainly shows neutrophilia along with lymphocytosis monocytosis basophilia and eosinophilia.  Immature granulocytes were seen.  Pathology smear review shows findings concerning for myeloproliferative neoplasm.  Blasts possibly less than 20% and BCR ABL testing was recommended.Patient noted to have wbc of 18 in may 2022 again with predominant left shift.  Patient currently reports left flank pain. Denies any nausea vomiting fever. Deneis any chest pain, sob or neurological complaints. States she was recently diagnosed with lupus but is not on any meds for the same   ECOG PS- 1  Pain scale- 4   Review of systems- Review of Systems  Constitutional:  Negative for chills, fever, malaise/fatigue and weight loss.  HENT:  Negative for congestion, ear discharge and nosebleeds.   Eyes:  Negative for blurred vision.  Respiratory:  Negative for cough, hemoptysis, sputum production, shortness of breath and wheezing.   Cardiovascular:  Negative for chest  pain, palpitations, orthopnea and claudication.  Gastrointestinal:  Negative for abdominal pain, blood in stool, constipation, diarrhea, heartburn, melena, nausea and vomiting.  Genitourinary:  Negative for dysuria, flank pain, frequency, hematuria and urgency.  Musculoskeletal:  Negative for back pain, joint pain and myalgias.  Skin:  Negative for rash.  Neurological:  Negative for dizziness, tingling, focal weakness, seizures, weakness and headaches.  Endo/Heme/Allergies:  Does not bruise/bleed easily.  Psychiatric/Behavioral:  Negative for depression and suicidal ideas. The patient does not have insomnia.    Allergies  Allergen Reactions   Bee Venom Anaphylaxis   Contrast Media [Iodinated Diagnostic Agents] Anaphylaxis   Eggs Or Egg-Derived Products Anaphylaxis   Other Anaphylaxis    Surgical dye.. Pt states ok to take if previously taken benadryl or prednisone   Penicillins Hives and Other (See Comments)    Has patient had a PCN reaction causing immediate rash, facial/tongue/throat swelling, SOB or lightheadedness with hypotension: No Has patient had a PCN reaction causing severe rash involving mucus membranes or skin necrosis: No Has patient had a PCN reaction that required hospitalization No Has patient had a PCN reaction occurring within the last 10 years: No If all of the above answers are "NO", then may proceed with Cephalosporin use.   Wellbutrin [Bupropion] Other (See Comments)    irritiability   Latex Rash   Oxycodone-Acetaminophen Itching    Patient Active Problem List   Diagnosis Date Noted   Leukocytosis 11/08/2021   Insomnia 07/30/2018   Chronic pain of both knees 07/30/2018   Morbid obesity due to excess calories ([place]) 05/09/2018   Depression, recurrent ([place]) 05/09/2018   GAD (generalized anxiety disorder) 05/09/2018   DVT (  deep venous thrombosis) ([place]) 04/15/2018   Bladder spasms 03/12/2018   Ureteral stone with hydronephrosis 02/28/2018   Kidney stone  04/09/2017   H/O pulmonary embolus during pregnancy 08/28/2016   S/P laparoscopic cholecystectomy 08/28/2016   Hyperparathyroidism ([place]) 09/10/2015   Vitamin D deficiency 06/25/2014   Hypercalcemia 02/09/2014   Essential hypertension 01/30/2014   Hyperlipidemia 01/30/2014   Nephrolithiasis, 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   Adult body mass index 50.0-59.9 (HCC) 03/25/2013   Anxiety state 03/25/2013   Asthma 03/25/2013   Obstructive sleep apnea 03/25/2013   Personal history of venous thrombosis and embolism 03/25/2013   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   Brain tumor ([place]) 12/18/2012   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     Past Medical History:  Diagnosis Date   Anemia    Anxiety    Asthma    seasonal   Benign brain tumor ([place])    Brain tumor ([place]) 2014   tx with radiation   Complication of anesthesia 1999   lung collapse after surgery for gallbladder   Depression    Ectopic pregnancy    Headache    migraines   Hoarseness of voice    Hypertension    Kidney stone    Obesity    Parathyroid abnormality (HCC)    Pulmonary emboli ([place]) 2011   Pulmonary embolism (HCC)    Radiation    Brain tumor   UTI (urinary tract infection)    Vitamin D deficiency      Past Surgical History:  Procedure Laterality Date   CHOLECYSTECTOMY  1999   CYSTOSCOPY W/ RETROGRADES Left 03/01/2018   Procedure: CYSTOSCOPY WITH RETROGRADE PYELOGRAM;  Surgeon: Abbie Sons, MD;  Location: [facility];  Service: Urology;  Laterality: Left;   CYSTOSCOPY W/ URETERAL STENT PLACEMENT Right 04/08/2017   Procedure: CYSTOSCOPY WITH RETROGRADE PYELOGRAM/URETERAL STENT PLACEMENT;  Surgeon: Rana Snare, MD;  Location: [facility];  Service: Urology;  Laterality: Right;   CYSTOSCOPY W/ URETERAL STENT PLACEMENT Left 03/12/2018   Procedure: CYSTOSCOPY WITH STENT REPLACEMENT;  Surgeon: Abbie Sons, MD;  Location: [facility];  Service: Urology;  Laterality: Left;   CYSTOSCOPY W/ URETEROSCOPY     CYSTOSCOPY WITH  RETROGRADE PYELOGRAM, URETEROSCOPY AND STENT PLACEMENT Left 01/20/2014   Procedure: LEFT URETEROSCOPY WITH STENT PLACEMENT;  Surgeon: Irine Seal, MD;  Location: [facility];  Service: Urology;  Laterality: Left;   CYSTOSCOPY WITH RETROGRADE PYELOGRAM, URETEROSCOPY AND STENT PLACEMENT Bilateral 04/23/2015   Procedure: CYSTOSCOPY WITH BILATERAL RETROGRADE PYELOGRAM, URETEROSCOPY AND STENT PLACEMENT;  Surgeon: Alexis Frock, MD;  Location: [facility];  Service: Urology;  Laterality: Bilateral;   CYSTOSCOPY WITH RETROGRADE PYELOGRAM, URETEROSCOPY AND STENT PLACEMENT Right 04/13/2017   Procedure: CYSTOSCOPY WITH RETROGRADE PYELOGRAM, URETEROSCOPY AND STENT EXCHANGE;  Surgeon: Alexis Frock, MD;  Location: [facility];  Service: Urology;  Laterality: Right;   CYSTOSCOPY WITH STENT PLACEMENT Left 01/12/2014   Procedure: CYSTOSCOPY WITH STENT PLACEMENT left retrograde;  Surgeon: Irine Seal, MD;  Location: [facility];  Service: Urology;  Laterality: Left;   CYSTOSCOPY WITH STENT PLACEMENT Left 03/01/2018   Procedure: CYSTOSCOPY WITH STENT PLACEMENT;  Surgeon: Abbie Sons, MD;  Location: [facility];  Service: Urology;  Laterality: Left;   CYSTOSCOPY/URETEROSCOPY/HOLMIUM LASER/STENT PLACEMENT Left 03/12/2018   Procedure: CYSTOSCOPY/URETEROSCOPY/HOLMIUM LASER;  Surgeon: Abbie Sons, MD;  Location: [facility];  Service: Urology;  Laterality: Left;   HOLMIUM LASER APPLICATION Left 04/22/3874   Procedure: HOLMIUM LASER APPLICATION;  Surgeon: Irine Seal, MD;  Location: [facility];  Service: Urology;  Laterality:  Left;   HOLMIUM LASER APPLICATION Bilateral 01/22/3663   Procedure: HOLMIUM LASER APPLICATION;  Surgeon: Alexis Frock, MD;  Location: [facility];  Service: Urology;  Laterality: Bilateral;   HOLMIUM LASER APPLICATION Right 03/20/4741   Procedure: HOLMIUM LASER APPLICATION;  Surgeon: Alexis Frock, MD;  Location: [facility];  Service: Urology;  Laterality: Right;   kidney stone removal     LITHOTRIPSY     PARATHYROIDECTOMY Right  11/13/2016   Procedure: RIGHT SUPERIOR PARATHYROIDECTOMY;  Surgeon: Armandina Gemma, MD;  Location: [place];  Service: General;  Laterality: Right;   surgery for,ectopic pregnancy  2011    1 fallopian tube rupture   TUBAL LIGATION  2011    Social History   Socioeconomic History   Marital status: Divorced    Spouse name: Not on file   Number of children: 4   Years of education: Not on file   Highest education level: Some college, no degree  Occupational History   Not on file  Tobacco Use   Smoking status: Never   Smokeless tobacco: Never  Vaping Use   Vaping Use: Never used  Substance and Sexual Activity   Alcohol use: Not Currently   Drug use: No   Sexual activity: Not Currently  Other Topics Concern   Not on file  Social History Narrative   Lives at home with her son, independent at baselinepend   Social Determinants of Health   Financial Resource Strain: Not on file  Food Insecurity: Not on file  Transportation Needs: Not on file  Physical Activity: Not on file  Stress: Not on file  Social Connections: Not on file  Intimate Partner Violence: Not on file     Family History  Problem Relation Age of Onset   Bell's palsy Mother    Heart failure Mother    Hypertension Mother    Lupus Mother    Anxiety disorder Mother    Depression Mother    Stroke Mother    Asthma Father    Hypertension Father    Hyperlipidemia Father    Anxiety disorder Father    Depression Father    Hypertension Maternal Grandmother    Diabetes Maternal Grandmother    Sarcoidosis Maternal Grandmother    Anxiety disorder Sister    Depression Sister    Bipolar disorder Sister    Depression Son    Bipolar disorder Son      Current Facility-Administered Medications:    acetaminophen (TYLENOL) tablet 650 mg, 650 mg, Oral, Q6H PRN **OR** acetaminophen (TYLENOL) suppository 650 mg, 650 mg, Rectal, Q6H PRN, Cox, Amy N, DO   cefTRIAXone (ROCEPHIN) 2 g in sodium chloride 0.9 % 100 mL IVPB, 2 g,  Intravenous, Q24H, Cox, Amy N, DO   enoxaparin (LOVENOX) injection 75 mg, 0.5 mg/kg, Subcutaneous, Q24H, Cox, Amy N, DO   HYDROmorphone (DILAUDID) injection 1 mg, 1 mg, Intravenous, Q3H PRN, Cox, Amy N, DO   ketorolac (TORADOL) 15 MG/ML injection 15 mg, 15 mg, Intravenous, Q8H PRN, Cox, Amy N, DO   lidocaine (XYLOCAINE) 2 % jelly 1 application, 1 application, Urethral, Once, Stoioff, Scott C, MD   ondansetron (ZOFRAN) tablet 4 mg, 4 mg, Oral, Q6H PRN **OR** ondansetron (ZOFRAN) injection 4 mg, 4 mg, Intravenous, Q6H PRN, Cox, Amy N, DO   potassium chloride 10 mEq in 100 mL IVPB, 10 mEq, Intravenous, Q1 Hr x 2, Vanessa [place], MD, Last Rate: 100 mL/hr at 11/08/21 1611, 10 mEq at 11/08/21 1611   propranolol (INDERAL) tablet 10 mg,  10 mg, Oral, TID PRN, Cox, Amy N, DO  Current Outpatient Medications:    progesterone (PROMETRIUM) 100 MG capsule, Take 100 mg by mouth at bedtime., Disp: , Rfl:    propranolol (INDERAL) 10 MG tablet, TAKE 1 TABLET BY MOUTH THREE TIMES DAILY AS NEEDED FOR SEVERE ANXIETY SYMPTOMS, Disp: , Rfl:    acetaminophen (TYLENOL) 500 MG tablet, Take 500 mg by mouth every 6 (six) hours as needed. (Patient not taking: Reported on 11/08/2021), Disp: , Rfl:    albuterol (PROVENTIL HFA;VENTOLIN HFA) 108 (90 Base) MCG/ACT inhaler, Inhale 2 puffs into the lungs every 6 (six) hours as needed for wheezing or shortness of breath., Disp: 1 Inhaler, Rfl: 2   albuterol (PROVENTIL) (2.5 MG/3ML) 0.083% nebulizer solution, Take 3 mLs (2.5 mg total) by nebulization every 6 (six) hours as needed for wheezing or shortness of breath., Disp: 75 mL, Rfl: 12   ARIPiprazole (ABILIFY) 10 MG tablet, Take 1 tablet (10 mg total) by mouth daily. For mood (Patient not taking: Reported on 11/08/2021), Disp: 30 tablet, Rfl: 0   citalopram (CELEXA) 10 MG tablet, Take 1 tablet (10 mg total) by mouth daily with breakfast. For anxiety (Patient not taking: Reported on 11/08/2021), Disp: 30 tablet, Rfl: 1   cyclobenzaprine  (FLEXERIL) 5 MG tablet, Take 1-2 tablets 3 times daily as needed (Patient not taking: Reported on 11/08/2021), Disp: 20 tablet, Rfl: 0   diclofenac sodium (VOLTAREN) 1 % GEL, Apply 4 g topically 4 (four) times daily. (Patient not taking: Reported on 11/08/2021), Disp: 100 g, Rfl: 12   diphenhydrAMINE (BENADRYL) 25 MG tablet, Take 25 mg by mouth daily as needed for allergies., Disp: , Rfl:    hydrOXYzine (VISTARIL) 25 MG capsule, Take 1 capsule (25 mg total) by mouth 2 (two) times daily as needed (severe anxiety symptoms). (Patient not taking: Reported on 11/08/2021), Disp: 60 capsule, Rfl: 1   ibuprofen (ADVIL) 400 MG tablet, Take 1 tablet (400 mg total) by mouth every 6 (six) hours as needed. (Patient not taking: Reported on 11/08/2021), Disp: 30 tablet, Rfl: 0   naproxen (NAPROSYN) 500 MG tablet, Take 1 tablet (500 mg total) by mouth 2 (two) times daily with a meal. (Patient not taking: Reported on 11/08/2021), Disp: 60 tablet, Rfl: 0   omeprazole (PRILOSEC) 20 MG capsule, Take 1 capsule (20 mg total) by mouth daily. (Patient not taking: Reported on 11/08/2021), Disp: 30 capsule, Rfl: 3   ondansetron (ZOFRAN ODT) 4 MG disintegrating tablet, Allow 1-2 tablets to dissolve in your mouth every 8 hours as needed for nausea/vomiting (Patient not taking: Reported on 11/08/2021), Disp: 30 tablet, Rfl: 0   orphenadrine (NORFLEX) 100 MG tablet, Take 1 tablet (100 mg total) by mouth 2 (two) times daily. (Patient not taking: Reported on 11/08/2021), Disp: 10 tablet, Rfl: 0   oxyCODONE-acetaminophen (PERCOCET) 7.5-325 MG tablet, Take 1 tablet by mouth every 4 (four) hours as needed for severe pain. (Patient not taking: Reported on 11/08/2021), Disp: 20 tablet, Rfl: 0   oxyCODONE-acetaminophen (PERCOCET/ROXICET) 5-325 MG tablet, Take 1 tablet by mouth every 4 (four) hours as needed for severe pain. (Patient not taking: Reported on 11/08/2021), Disp: 20 tablet, Rfl: 0   traZODone (DESYREL) 150 MG tablet, Take 1 tablet  (150 mg total) by mouth at bedtime. (Patient not taking: Reported on 11/08/2021), Disp: 30 tablet, Rfl: 1   Vitamin D, Ergocalciferol, (DRISDOL) 50000 units CAPS capsule, Take 1 capsule (50,000 Units total) by mouth every 7 (seven) days. (Patient not taking: Reported on  11/08/2021), Disp: 12 capsule, Rfl: 1   Physical exam:  Vitals:   11/08/21 1201 11/08/21 1332 11/08/21 1334 11/08/21 1509  BP: (!) 129/98 (!) 142/102 (!) 142/102 (!) 147/105  Pulse: 92 93 84 97  Resp: 18 18 20 18   Temp:    98.2 F (36.8 C)  TempSrc:    Oral  SpO2: 90% 94% 99% 95%  Weight:      Height:       Physical Exam Constitutional:      General: She is not in acute distress. Cardiovascular:     Rate and Rhythm: Normal rate and regular rhythm.     Heart sounds: Normal heart sounds.  Pulmonary:     Effort: Pulmonary effort is normal.     Breath sounds: Normal breath sounds.  Abdominal:     General: Bowel sounds are normal.     Palpations: Abdomen is soft.     Comments: No palpable hepatosplenomegaly  Skin:    General: Skin is warm and dry.  Neurological:     Mental Status: She is alert and oriented to person, place, and time.       CMP Latest Ref Rng & Units 11/08/2021  Glucose 70 - 99 mg/dL 137(H)  BUN 6 - 20 mg/dL 10  Creatinine 0.44 - 1.00 mg/dL 0.76  Sodium 135 - 145 mmol/L 137  Potassium 3.5 - 5.1 mmol/L 3.4(L)  Chloride 98 - 111 mmol/L 105  CO2 22 - 32 mmol/L 24  Calcium 8.9 - 10.3 mg/dL 9.0  Total Protein 6.5 - 8.1 g/dL 8.5(H)  Total Bilirubin 0.3 - 1.2 mg/dL 0.8  Alkaline Phos 38 - 126 U/L 76  AST 15 - 41 U/L 23  ALT 0 - 44 U/L 15   CBC Latest Ref Rng & Units 11/08/2021  WBC 4.0 - 10.5 K/uL 133.7(HH)  Hemoglobin 12.0 - 15.0 g/dL 11.4(L)  Hematocrit 36.0 - 46.0 % 37.6  Platelets 150 - 400 K/uL 176    @IMAGES @  CT Abdomen Pelvis Wo Contrast  Result Date: 11/08/2021 CLINICAL DATA:  Left flank pain, kidney stone suspected. EXAM: CT ABDOMEN AND PELVIS WITHOUT CONTRAST TECHNIQUE:  Multidetector CT imaging of the abdomen and pelvis was performed following the standard protocol without IV contrast. COMPARISON:  CT examination dated March 16, 2020 FINDINGS: Lower chest: No acute abnormality. Hepatobiliary: No focal liver abnormality is seen. Status post cholecystectomy. No biliary dilatation. Pancreas: Unremarkable. No pancreatic ductal dilatation or surrounding inflammatory changes. Spleen: Normal in size without focal abnormality. Adrenals/Urinary Tract: Adrenal glands are unremarkable. Moderate left hydronephrosis secondary to a 1.1 x 0.9 x 1.3 cm calculus at the left ureteropelvic junction/proximal left ureter. Mild left perinephric fat stranding. There is another punctate nonobstructing calculus in the upper pole of the left kidney. Simple cysts in the lower pole of the right kidney. No right hydroureteronephrosis. Right kidney and ureter bladder is unremarkable. Stomach/Bowel: Stomach is within normal limits. Appendix appears normal. No evidence of bowel wall thickening, distention, or inflammatory changes. Vascular/Lymphatic: No significant vascular findings are present. No enlarged abdominal or pelvic lymph nodes. Reproductive: Uterus and bilateral adnexa are unremarkable. Other: No abdominal wall hernia or abnormality. No abdominopelvic ascites. Musculoskeletal: No acute or significant osseous findings. Mild multilevel degenerative disc disease of the lumbar spine. IMPRESSION: Moderate left hydronephrosis secondary to a 1.1 x 0.9 x 1.3 cm calculus at the left ureteropelvic junction/proximal left ureter. Electronically Signed   By: Keane Police D.O.   On: 11/08/2021 11:52    Assessment and  plan- Patient is a 50 y.o. female admitted for left flank pain secondary to left hydroureteronephrosis from renal calculus incidentally found to have a white cell count of 133  Leukocytosis: Differential mainly shows left shift with neutrophilia lymphocytosis as well as monocytosis eosinophilia and  basophilia.  I did reach out to Dr. Dessie Coma from pathology and she has reviewed the smear and overall findings are not suggestive of acute leukemia but rather concerning for a chronic myeloproliferative process such as CML.  I have ordered peripheral flow cytometry BCR ABL PCR BCR ABL FISH testing.  We will also check LDHPT PTT INR uric acid phosphorus and G6PD levels.I have reviewed the smear personally as well. Predominant left shift with myelocytes, neutrophilia, basophilia and eosinophilia and occasional blasts. Overall features support a chronic rather than acute process. Moreover patient does not have other concomitant cytopenias such as severe anemia and thrombocytopenia.   I will hold off on giving her Hydrea at this time until evaluated by urology if there is any intervention plan.  After urologic intervention is completed I will plan to start her on Hydrea 1 g daily until we have the results of BCR ABL and flow cytometry.  Will continue to follow. Relevant labs have been ordered.I have explained everything to patient in detail and she verbalized understanding of the plan.   Thank you for this kind referral and the opportunity to participate in the care of this patient   Visit Diagnosis Leukocytosis  Dr. Randa Evens, MD, MPH [facility] at [facility] [phone] 11/08/2021 5:17 PM

## 2021-11-08 NOTE — ED Notes (Addendum)
Contacted lab requesting blood draws for multiple labs and cultures

## 2021-11-08 NOTE — H&P (Signed)
History and Physical   Carol Ortega KXF:[id number] DOB: [birth year] DOA: 11/08/2021  PCP: Berkley Harvey, NP  Outpatient Specialists: Dr. Ouida Sills, [facility] Patient coming from: Home  I have personally briefly reviewed patient's old medical records in [place].  Chief Concern: Left flank pain  HPI: Carol Ortega is a 50 y.o. female with medical history significant for hypertension, anxiety, depression, morbid obesity, presumed obstructive sleep apnea, who presents to the emergency department for chief concerns of left flank pain.  The left flank pain started about two weeks ago. She reports she didn't come in because it was mild, she thought it was gas. She has been taking tylenol and it does ease up her pain however the pain never really went away.  She reports she took a couple of old Flexeril that was prescribed from prior kidney stones.  This did help relieve her pain.  Additionally, she has had decreased BM, she took a laxative last evening.   However, on day of admission, she woke up today and the pain was unbearable. The pain became persistent, she was not able to get comfortable, she states the pain was a 10/10, she describes the pain as a cramping pain similar to labor pain.  She states that this pain is more severe than previous kidney stones in the past.  She reports that about two weeks ago, she noticed that whenever she had to use the bathroom for bowel movement, she would become nauseous and start vomiting. She denies black coffee ground substances or blood in her vomitus. She reports the vomitus was yellow, like bile and foamy.    She reports the pain medications have improved her pain down to a 7.   She denies chest pain, shortness of breath, dysuria, hematuria, diarrhea, syncope, dysphagia, fever, chills, new cough.   She reports that she has not passed gas for three weeks.   Her last bm was evening of 11/07/21 and was loose. She last had her menstrual cycle  in June 2022.   Health maintenance: She has never had a colonoscopy. She is not up to date on her mammogram. She just had a pap smear.   Social history: She lives at home with her 45 year old son. She denies history of tobacco. She is a social etoh user, and last drink was September. She infrequently smokes THC and lasted used in July. 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  Vaccination history: She is not vaccinated for covid 19  ROS: Constitutional: no weight change, no fever ENT/Mouth: no sore throat, no rhinorrhea Eyes: no eye pain, no vision changes Cardiovascular: no chest pain, no dyspnea,  no edema, no palpitations Respiratory: no cough, no sputum, no wheezing Gastrointestinal: + nausea, + vomiting, no diarrhea, no constipation Genitourinary: no urinary incontinence, no dysuria, no hematuria Musculoskeletal: no arthralgias, + myalgias, left flank pain that radiates to left mid lower abdominal Skin: no skin lesions, no pruritus, Neuro: + weakness, no loss of consciousness, no syncope Psych: no anxiety, no depression, + decrease appetite Heme/Lymph: no bruising, no bleeding  ED Course: Discussed with emergency medicine provider, patient requiring hospitalization for chief concerns of severe leukocytosis.  Vitals in the emergency department was remarkable for temperature of 97.9, respiration rate of 18, heart rate of 100, blood pressure 172/111, SPO2 of 99% on room air.  Labs in the emergency department was remarkable for serum sodium 137, potassium 3.4, chloride 105, bicarb 24, BUN of 10, serum creatinine of 0.76,  nonfasting blood glucose 137, GFR greater than 60.  Leukocytosis was elevated at 133.7, hemoglobin 11.4, platelets 176.  Lactic acid was 1.2. Urine pregnancy was negative. Urine culture is pending. COVID/influenza A/influenza B PCR were negative.  In the emergency department patient received Dilaudid 1 mg IV, Toradol 30 mg IV, Phenergan 12.5 mg IV,  sodium chloride bolus 500.  Assessment/Plan  Principal Problem:   Leukocytosis Active Problems:   Hyperparathyroidism ([place])   Kidney stone   Morbid obesity due to excess calories (HCC)   Depression, recurrent (HCC)   GAD (generalized anxiety disorder)   Insomnia   Adult body mass index 50.0-59.9 ([place])   Anxiety state   Essential hypertension   Hyperlipidemia   Obstructive sleep apnea   S/P laparoscopic cholecystectomy   Brain tumor ([place])   # Severe markedly elevated leukocytosis-present on admission - Etiology work-up in progress - Pathology smear review was ordered and read as: Leukocytosis with neutrophilic predominance.  Abundant myelocytes noted.  Basophils, eosinophils, nucleated RBCs.  Findings are worrisome for evolving myeloproliferative neoplasm.  Blasts are estimated below 20%, however flow cytometry is required for accurate quantification. - Hematologist, Dr. Janese Banks has been consulted - Flow cytometry panel, BCR-ABL FISH, CML/ALL PCR, lactic dehydrogenase, uric acid, phosphorus, fibrinogen, INR, PTT, TSH - I called labs to ensure that the labs that Dr. Janese Banks ordered will be collected, lab tech acknowledges my call and is aware of the lab orders - Dr. Janese Banks states that patient can be started on hydroxyurea, however this is not urgent and she would defer initiating this medication at this time pending neurology evaluation/interval - Lactated ringer 125 mL/h, 1 day ordered  # Left flank pain presumed secondary to left moderate hydronephrosis secondary to calculus in the left ureteropelvic junction/proximal left ureter - Flomax ordered - Urology has been consulted for possible stent placement - Urology states that he was see the patient and recommends to monitor for fever, if she develops fever then a possible stent will be placed - Urine culture ordered - Ceftriaxone 2 g IV ordered  # Abnormal bowel gas pattern per symptoms and bowel movements - Patient has never had a  colonoscopy - Extensive discussion with patient that she will need an outpatient colonoscopy - Patient endorses understanding and compliance  # Morbid obesity # With presumed obstructive sleep apnea-CPAP nightly ordered  # Anxiety/depression- Patient states that she is no longer taking Abilify, citalopram, trazodone - Continue outpatient follow-up with PCP and/or behavioral  # History of GERD-patient states she is no longer taking omeprazole  # History of chronic pain-patient states she is no longer taking Flexeril  Chart reviewed.   DVT prophylaxis: Enoxaparin weightbase, subcutaneous every 24 hours Code Status: Full code Diet: Heart healthy Family Communication: No, patient states she can update her family Disposition Plan: Pending clinical course Consults called: Hematology, urology Admission status: Telemetry medical, observation  Past Medical History:  Diagnosis Date   Anemia    Anxiety    Asthma    seasonal   Benign brain tumor ([place])    Brain tumor ([place]) 2014   tx with radiation   Complication of anesthesia 1999   lung collapse after surgery for gallbladder   Depression    Ectopic pregnancy    Headache    migraines   Hoarseness of voice    Hypertension    Kidney stone    Obesity    Parathyroid abnormality ([place])    Pulmonary emboli ([place]) 2011   Pulmonary embolism ([place])  Radiation    Brain tumor   UTI (urinary tract infection)    Vitamin D deficiency    Past Surgical History:  Procedure Laterality Date   CHOLECYSTECTOMY  1999   CYSTOSCOPY W/ RETROGRADES Left 03/01/2018   Procedure: CYSTOSCOPY WITH RETROGRADE PYELOGRAM;  Surgeon: Abbie Sons, MD;  Location: [facility];  Service: Urology;  Laterality: Left;   CYSTOSCOPY W/ URETERAL STENT PLACEMENT Right 04/08/2017   Procedure: CYSTOSCOPY WITH RETROGRADE PYELOGRAM/URETERAL STENT PLACEMENT;  Surgeon: Rana Snare, MD;  Location: [facility];  Service: Urology;  Laterality: Right;   CYSTOSCOPY W/ URETERAL STENT  PLACEMENT Left 03/12/2018   Procedure: CYSTOSCOPY WITH STENT REPLACEMENT;  Surgeon: Abbie Sons, MD;  Location: [facility];  Service: Urology;  Laterality: Left;   CYSTOSCOPY W/ URETEROSCOPY     CYSTOSCOPY WITH RETROGRADE PYELOGRAM, URETEROSCOPY AND STENT PLACEMENT Left 01/20/2014   Procedure: LEFT URETEROSCOPY WITH STENT PLACEMENT;  Surgeon: Irine Seal, MD;  Location[facility];  Service: Urology;  Laterality: Left;   CYSTOSCOPY WITH RETROGRADE PYELOGRAM, URETEROSCOPY AND STENT PLACEMENT Bilateral 04/23/2015   Procedure: CYSTOSCOPY WITH BILATERAL RETROGRADE PYELOGRAM, URETEROSCOPY AND STENT PLACEMENT;  Surgeon: Alexis Frock, MD;  [facility];  Service: Urology;  Laterality: Bilateral;   CYSTOSCOPY WITH RETROGRADE PYELOGRAM, URETEROSCOPY AND STENT PLACEMENT Right 04/13/2017   Procedure: CYSTOSCOPY WITH RETROGRADE PYELOGRAM, URETEROSCOPY AND STENT EXCHANGE;  Surgeon: Alexis Frock, MD;  [facility];  Service: Urology;  Laterality: Right;   CYSTOSCOPY WITH STENT PLACEMENT Left 01/12/2014   Procedure: CYSTOSCOPY WITH STENT PLACEMENT left retrograde;  Surgeon: Irine Seal, MD;  [facility];  Service: Urology;  Laterality: Left;   CYSTOSCOPY WITH STENT PLACEMENT Left 03/01/2018   Procedure: CYSTOSCOPY WITH STENT PLACEMENT;  Surgeon: Abbie Sons, MD;  Location: [facility];  Service: Urology;  Laterality: Left;   CYSTOSCOPY/URETEROSCOPY/HOLMIUM LASER/STENT PLACEMENT Left 03/12/2018   Procedure: CYSTOSCOPY/URETEROSCOPY/HOLMIUM LASER;  Surgeon: Abbie Sons, MD;  Location: [facility];  Service: Urology;  Laterality: Left;   HOLMIUM LASER APPLICATION Left 05/22/353   Procedure: HOLMIUM LASER APPLICATION;  Surgeon: Irine Seal, MD;  [facility];  Service: Urology;  Laterality: Left;   HOLMIUM LASER APPLICATION Bilateral 05/23/6811   Procedure: HOLMIUM LASER APPLICATION;  Surgeon: Alexis Frock, MD;  [facility];  Service: Urology;  Laterality: Bilateral;   HOLMIUM LASER APPLICATION Right  7/51/7001   Procedure: HOLMIUM LASER APPLICATION;  Surgeon: Alexis Frock, MD;  [facility];  Service: Urology;  Laterality: Right;   kidney stone removal     LITHOTRIPSY     PARATHYROIDECTOMY Right 11/13/2016   Procedure: RIGHT SUPERIOR PARATHYROIDECTOMY;  Surgeon: Armandina Gemma, MD;  Location: [facility];  Service: General;  Laterality: Right;   surgery for,ectopic pregnancy  2011    1 fallopian tube rupture   TUBAL LIGATION  2011   Social History:  reports that she has never smoked. She has never used smokeless tobacco. She reports that she does not currently use alcohol. She reports that she does not use drugs.  Allergies  Allergen Reactions   Bee Venom Anaphylaxis   Contrast Media [Iodinated Diagnostic Agents] Anaphylaxis   Eggs Or Egg-Derived Products Anaphylaxis   Other Anaphylaxis    Surgical dye.. Pt states ok to take if previously taken benadryl or prednisone   Penicillins Hives and Other (See Comments)    Has patient had a PCN reaction causing immediate rash, facial/tongue/throat swelling, SOB or lightheadedness with hypotension: No Has patient had a PCN reaction causing severe rash involving mucus  membranes or skin necrosis: No Has patient had a PCN reaction that required hospitalization No Has patient had a PCN reaction occurring within the last 10 years: No If all of the above answers are "NO", then may proceed with Cephalosporin use.   Wellbutrin [Bupropion] Other (See Comments)    irritiability   Latex Rash   Oxycodone-Acetaminophen Itching   Family History  Problem Relation Age of Onset   Bell's palsy Mother    Heart failure Mother    Hypertension Mother    Lupus Mother    Anxiety disorder Mother    Depression Mother    Stroke Mother    Asthma Father    Hypertension Father    Hyperlipidemia Father    Anxiety disorder Father    Depression Father    Hypertension Maternal Grandmother    Diabetes Maternal Grandmother    Sarcoidosis Maternal Grandmother     Anxiety disorder Sister    Depression Sister    Bipolar disorder Sister    Depression Son    Bipolar disorder Son    Family history: Family history reviewed and not pertinent  Prior to Admission medications   Medication Sig Start Date End Date Taking? Authorizing Provider  progesterone (PROMETRIUM) 100 MG capsule Take 100 mg by mouth at bedtime. 09/12/21  Yes [provider]  propranolol (INDERAL) 10 MG tablet TAKE 1 TABLET BY MOUTH THREE TIMES DAILY AS NEEDED FOR SEVERE ANXIETY SYMPTOMS 09/11/18  Yes [provider]  acetaminophen (TYLENOL) 500 MG tablet Take 500 mg by mouth every 6 (six) hours as needed. Patient not taking: Reported on 11/08/2021    [provider]  albuterol (PROVENTIL HFA;VENTOLIN HFA) 108 (90 Base) MCG/ACT inhaler Inhale 2 puffs into the lungs every 6 (six) hours as needed for wheezing or shortness of breath. 03/04/18   Loletha Grayer, MD  albuterol (PROVENTIL) (2.5 MG/3ML) 0.083% nebulizer solution Take 3 mLs (2.5 mg total) by nebulization every 6 (six) hours as needed for wheezing or shortness of breath. 03/04/18   Loletha Grayer, MD  ARIPiprazole (ABILIFY) 10 MG tablet Take 1 tablet (10 mg total) by mouth daily. For mood Patient not taking: Reported on 11/08/2021 09/23/18   Ursula Alert, MD  citalopram (CELEXA) 10 MG tablet Take 1 tablet (10 mg total) by mouth daily with breakfast. For anxiety Patient not taking: Reported on 11/08/2021 10/23/18   Ursula Alert, MD  cyclobenzaprine (FLEXERIL) 5 MG tablet Take 1-2 tablets 3 times daily as needed Patient not taking: Reported on 11/08/2021 10/03/20   Laban Emperor, PA-C  diclofenac sodium (VOLTAREN) 1 % GEL Apply 4 g topically 4 (four) times daily. Patient not taking: Reported on 11/08/2021 07/30/18   Park Liter P, DO  diphenhydrAMINE (BENADRYL) 25 MG tablet Take 25 mg by mouth daily as needed for allergies.    [provider]  hydrOXYzine (VISTARIL) 25 MG capsule Take 1 capsule  (25 mg total) by mouth 2 (two) times daily as needed (severe anxiety symptoms). Patient not taking: Reported on 11/08/2021 10/23/18   Ursula Alert, MD  ibuprofen (ADVIL) 400 MG tablet Take 1 tablet (400 mg total) by mouth every 6 (six) hours as needed. Patient not taking: Reported on 11/08/2021 10/03/20   Laban Emperor, PA-C  naproxen (NAPROSYN) 500 MG tablet Take 1 tablet (500 mg total) by mouth 2 (two) times daily with a meal. Patient not taking: Reported on 11/08/2021 11/27/18   Park Liter P, DO  omeprazole (PRILOSEC) 20 MG capsule Take 1 capsule (20 mg total)  by mouth daily. Patient not taking: Reported on 11/08/2021 09/26/18   Park Liter P, DO  ondansetron (ZOFRAN ODT) 4 MG disintegrating tablet Allow 1-2 tablets to dissolve in your mouth every 8 hours as needed for nausea/vomiting Patient not taking: Reported on 11/08/2021 03/26/20   Hinda Kehr, MD  orphenadrine (NORFLEX) 100 MG tablet Take 1 tablet (100 mg total) by mouth 2 (two) times daily. Patient not taking: Reported on 11/08/2021 05/23/21   Sable Feil, PA-C  oxyCODONE-acetaminophen (PERCOCET) 7.5-325 MG tablet Take 1 tablet by mouth every 4 (four) hours as needed for severe pain. Patient not taking: Reported on 11/08/2021 05/23/21 05/23/22  Sable Feil, PA-C  oxyCODONE-acetaminophen (PERCOCET/ROXICET) 5-325 MG tablet Take 1 tablet by mouth every 4 (four) hours as needed for severe pain. Patient not taking: Reported on 11/08/2021 03/16/20   Paulette Blanch, MD  traZODone (DESYREL) 150 MG tablet Take 1 tablet (150 mg total) by mouth at bedtime. Patient not taking: Reported on 11/08/2021 09/23/18   Ursula Alert, MD  Vitamin D, Ergocalciferol, (DRISDOL) 50000 units CAPS capsule Take 1 capsule (50,000 Units total) by mouth every 7 (seven) days. Patient not taking: Reported on 11/08/2021 07/31/18   Valerie Roys, DO   Physical Exam: Vitals:   11/08/21 1201 11/08/21 1332 11/08/21 1334 11/08/21 1509  BP: (!) 129/98 (!)  142/102 (!) 142/102 (!) 147/105  Pulse: 92 93 84 97  Resp: 18 18 20 18   Temp:    98.2 F (36.8 C)  TempSrc:    Oral  SpO2: 90% 94% 99% 95%  Weight:      Height:       Constitutional: appears age-appropriate, NAD, calm, comfortable Eyes: PERRL, lids and conjunctivae normal ENMT: Mucous membranes are moist. Posterior pharynx clear of any exudate or lesions. Age-appropriate dentition. Hearing appropriate Neck: normal, supple, no masses, no thyromegaly Respiratory: clear to auscultation bilaterally, no wheezing, no crackles. Normal respiratory effort. No accessory muscle use.  Cardiovascular: Regular rate and rhythm, no murmurs / rubs / gallops. No extremity edema. 2+ pedal pulses. No carotid bruits.  Abdomen: Morbidly obese abdomen, no tenderness, no masses palpated, no hepatosplenomegaly. Bowel sounds positive.  Musculoskeletal: no clubbing / cyanosis. No joint deformity upper and lower extremities. Good ROM, no contractures, no atrophy. Normal muscle tone.  Skin: no rashes, lesions, ulcers. No induration Neurologic: Sensation intact. Strength 5/5 in all 4.  Psychiatric: Normal judgment and insight. Alert and oriented x 3. Normal mood.   EKG: independently reviewed, showing sinus rhythm with rate of 88, QTc 445  Chest x-ray on Admission: I personally reviewed and I agree with radiologist reading as below.  CT Abdomen Pelvis Wo Contrast  Result Date: 11/08/2021 CLINICAL DATA:  Left flank pain, kidney stone suspected. EXAM: CT ABDOMEN AND PELVIS WITHOUT CONTRAST TECHNIQUE: Multidetector CT imaging of the abdomen and pelvis was performed following the standard protocol without IV contrast. COMPARISON:  CT examination dated March 16, 2020 FINDINGS: Lower chest: No acute abnormality. Hepatobiliary: No focal liver abnormality is seen. Status post cholecystectomy. No biliary dilatation. Pancreas: Unremarkable. No pancreatic ductal dilatation or surrounding inflammatory changes. Spleen: Normal in  size without focal abnormality. Adrenals/Urinary Tract: Adrenal glands are unremarkable. Moderate left hydronephrosis secondary to a 1.1 x 0.9 x 1.3 cm calculus at the left ureteropelvic junction/proximal left ureter. Mild left perinephric fat stranding. There is another punctate nonobstructing calculus in the upper pole of the left kidney. Simple cysts in the lower pole of the right kidney. No right hydroureteronephrosis. Right  kidney and ureter bladder is unremarkable. Stomach/Bowel: Stomach is within normal limits. Appendix appears normal. No evidence of bowel wall thickening, distention, or inflammatory changes. Vascular/Lymphatic: No significant vascular findings are present. No enlarged abdominal or pelvic lymph nodes. Reproductive: Uterus and bilateral adnexa are unremarkable. Other: No abdominal wall hernia or abnormality. No abdominopelvic ascites. Musculoskeletal: No acute or significant osseous findings. Mild multilevel degenerative disc disease of the lumbar spine. IMPRESSION: Moderate left hydronephrosis secondary to a 1.1 x 0.9 x 1.3 cm calculus at the left ureteropelvic junction/proximal left ureter. Electronically Signed   By: Keane Police D.O.   On: 11/08/2021 11:52    Labs on Admission: I have personally reviewed following labs  CBC: Recent Labs  Lab 11/08/21 1111 11/08/21 1228  WBC 138.4* 133.7*  NEUTROABS  --  74.0*  HGB 12.1 11.4*  HCT 39.5 37.6  MCV 87.2 88.1  PLT 190 875   Basic Metabolic Panel: Recent Labs  Lab 11/08/21 1111  NA 137  K 3.4*  CL 105  CO2 24  GLUCOSE 137*  BUN 10  CREATININE 0.76  CALCIUM 9.0  MG 2.2   GFR: Estimated Creatinine Clearance: 127.8 mL/min (by C-G formula based on SCr of 0.76 mg/dL).  Liver Function Tests: Recent Labs  Lab 11/08/21 1111  AST 23  ALT 15  ALKPHOS 76  BILITOT 0.8  PROT 8.5*  ALBUMIN 4.0   Recent Labs  Lab 11/08/21 1111  LIPASE 28   Urine analysis:    Component Value Date/Time   COLORURINE YELLOW (A)  11/08/2021 1228   APPEARANCEUR CLOUDY (A) 11/08/2021 1228   APPEARANCEUR Hazy (A) 09/26/2018 1605   LABSPEC 1.021 11/08/2021 1228   PHURINE 5.0 11/08/2021 1228   GLUCOSEU NEGATIVE 11/08/2021 1228   HGBUR MODERATE (A) 11/08/2021 1228   BILIRUBINUR NEGATIVE 11/08/2021 1228   BILIRUBINUR Negative 09/26/2018 [place] 11/08/2021 1228   PROTEINUR 30 (A) 11/08/2021 1228   UROBILINOGEN 1.0 10/23/2015 0556   NITRITE NEGATIVE 11/08/2021 1228   LEUKOCYTESUR NEGATIVE 11/08/2021 1228   Dr. Tobie Poet [facility]  If 7PM-7AM, please contact overnight-coverage provider If 7AM-7PM, please contact day coverage provider [email or web address]  11/08/2021, 4:51 PM

## 2021-11-08 NOTE — ED Notes (Signed)
Pt back from CT at this time will obtain EKG

## 2021-11-08 NOTE — Progress Notes (Signed)
PHARMACIST - PHYSICIAN COMMUNICATION  CONCERNING:  Enoxaparin (Lovenox) for DVT Prophylaxis    RECOMMENDATION: Patient was prescribed enoxaparin 40mg  q24 hours for VTE prophylaxis.   Filed Weights   11/08/21 1050  Weight: (!) 151.5 kg (334 lb)    Body mass index is 53.91 kg/m.  Estimated Creatinine Clearance: 127.8 mL/min (by C-G formula based on SCr of 0.76 mg/dL).   Based on [place] patient is candidate for enoxaparin 0.5mg /kg TBW SQ every 24 hours based on BMI being >30.  DESCRIPTION: Pharmacy has adjusted enoxaparin dose per [facility] policy.  Patient is now receiving enoxaparin 75 mg every 24 hours    Benita Gutter 11/08/2021 3:29 PM

## 2021-11-08 NOTE — ED Notes (Signed)
Repeat CBC with differential sent to lab

## 2021-11-08 NOTE — ED Notes (Signed)
Pt encouraged to provide urine sample when able. Pt verbalized understanding

## 2021-11-08 NOTE — ED Triage Notes (Signed)
C/O lower back and left flank pain.  States has history of kidney stones.  Vomiting in lobby.

## 2021-11-08 NOTE — ED Notes (Signed)
Lab called critical WBC count 133.7.  EDP verbally notified.

## 2021-11-08 NOTE — ED Provider Notes (Signed)
[facility] Emergency Department Provider Note    ____________________________________________   Event Date/Time   First MD Initiated Contact with Patient 11/08/21 1057     (approximate)  I have reviewed the triage vital signs and the nursing notes.   HISTORY  Chief Complaint Flank Pain    HPI Leotha Westermeyer is a 50 y.o. female, hx of asthma,  PE, nephrolithiasis, and brain tumor, presents to the emergency department for evaluation of left flank pain.  Patient states that the pain has been going on for the past 3 weeks, but has recently increased over the past day.  Describes the pain as a sharp, tearing sensation on her left side. 8/10, that radiates to the lower back and umbilicus.  She has been treating at home with Tylenol with little to no relief.  No fever at home but endorses intermittent chills.  Reports multiple episodes of vomiting (non-bloody) over the past couple of days.  Additionally reports that she has not had a bowel movement for over a week.  She has attempted laxatives at home with little effect.  Patient states that this pain feels very similar to her normal kidney stones, but is much more severe than she has ever had. She endorses complex history of kidney stones requiring stenting in the past.   History limited by: No limitations.  Past Medical History:  Diagnosis Date   Anemia    Anxiety    Asthma    seasonal   Benign brain tumor ([place])    Brain tumor ([place]) 2014   tx with radiation   Complication of anesthesia 1999   lung collapse after surgery for gallbladder   Depression    Ectopic pregnancy    Headache    migraines   Hoarseness of voice    Hypertension    Kidney stone    Obesity    Parathyroid abnormality ([place])    Pulmonary emboli ([place]) 2011   Pulmonary embolism ([place])    Radiation    Brain tumor   UTI (urinary tract infection)    Vitamin D deficiency     Patient Active Problem List   Diagnosis Date Noted    Leukocytosis 11/08/2021   Myeloproliferative disorder ([place])    Insomnia 07/30/2018   Chronic pain of both knees 07/30/2018   Morbid obesity due to excess calories ([place]) 05/09/2018   Depression, recurrent ([place]) 05/09/2018   GAD (generalized anxiety disorder) 05/09/2018   DVT (deep venous thrombosis) ([place]) 04/15/2018   Bladder spasms 03/12/2018   Ureteral stone with hydronephrosis 02/28/2018   Kidney stone 04/09/2017   H/O pulmonary embolus during pregnancy 08/28/2016   S/P laparoscopic cholecystectomy 08/28/2016   Hyperparathyroidism ([place]) 09/10/2015   Vitamin D deficiency 06/25/2014   Hypercalcemia 02/09/2014   Essential hypertension 01/30/2014   Hyperlipidemia 01/30/2014   Nephrolithiasis, 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   Adult body mass index 50.0-59.9 ([place]) 03/25/2013   Anxiety state 03/25/2013   Asthma 03/25/2013   Obstructive sleep apnea 03/25/2013   Personal history of venous thrombosis and embolism 03/25/2013   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   Brain tumor ([place]) 12/18/2012   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    Past Surgical History:  Procedure Laterality Date   CHOLECYSTECTOMY  1999   CYSTOSCOPY W/ RETROGRADES Left 03/01/2018   Procedure: CYSTOSCOPY WITH RETROGRADE PYELOGRAM;  Surgeon: Abbie Sons, MD;  Location: [facility];  Service: Urology;  Laterality: Left;   CYSTOSCOPY W/ URETERAL STENT PLACEMENT Right 04/08/2017   Procedure: CYSTOSCOPY WITH RETROGRADE PYELOGRAM/URETERAL  STENT PLACEMENT;  Surgeon: Rana Snare, MD;  Location: [facility];  Service: Urology;  Laterality: Right;   CYSTOSCOPY W/ URETERAL STENT PLACEMENT Left 03/12/2018   Procedure: CYSTOSCOPY WITH STENT REPLACEMENT;  Surgeon: Abbie Sons, MD;  Location: [facility];  Service: Urology;  Laterality: Left;   CYSTOSCOPY W/ URETEROSCOPY     CYSTOSCOPY WITH RETROGRADE PYELOGRAM, URETEROSCOPY AND STENT PLACEMENT Left 01/20/2014   Procedure: LEFT URETEROSCOPY WITH STENT PLACEMENT;  Surgeon: Irine Seal, MD;   Location: [facility];  Service: Urology;  Laterality: Left;   CYSTOSCOPY WITH RETROGRADE PYELOGRAM, URETEROSCOPY AND STENT PLACEMENT Bilateral 04/23/2015   Procedure: CYSTOSCOPY WITH BILATERAL RETROGRADE PYELOGRAM, URETEROSCOPY AND STENT PLACEMENT;  Surgeon: Alexis Frock, MD;  Location: [facility];  Service: Urology;  Laterality: Bilateral;   CYSTOSCOPY WITH RETROGRADE PYELOGRAM, URETEROSCOPY AND STENT PLACEMENT Right 04/13/2017   Procedure: CYSTOSCOPY WITH RETROGRADE PYELOGRAM, URETEROSCOPY AND STENT EXCHANGE;  Surgeon: Alexis Frock, MD;  [facility];  Service: Urology;  Laterality: Right;   CYSTOSCOPY WITH STENT PLACEMENT Left 01/12/2014   Procedure: CYSTOSCOPY WITH STENT PLACEMENT left retrograde;  Surgeon: Irine Seal, MD;  Location: [facility];  Service: Urology;  Laterality: Left;   CYSTOSCOPY WITH STENT PLACEMENT Left 03/01/2018   Procedure: CYSTOSCOPY WITH STENT PLACEMENT;  Surgeon: Abbie Sons, MD;  Location: [facility];  Service: Urology;  Laterality: Left;   CYSTOSCOPY/URETEROSCOPY/HOLMIUM LASER/STENT PLACEMENT Left 03/12/2018   Procedure: CYSTOSCOPY/URETEROSCOPY/HOLMIUM LASER;  Surgeon: Abbie Sons, MD;  Location: [facility];  Service: Urology;  Laterality: Left;   HOLMIUM LASER APPLICATION Left 05/22/353   Procedure: HOLMIUM LASER APPLICATION;  Surgeon: Irine Seal, MD;  [facility];  Service: Urology;  Laterality: Left;   HOLMIUM LASER APPLICATION Bilateral 05/23/6811   Procedure: HOLMIUM LASER APPLICATION;  Surgeon: Alexis Frock, MD;  Location: [facility];  Service: Urology;  Laterality: Bilateral;   HOLMIUM LASER APPLICATION Right 7/51/7001   Procedure: HOLMIUM LASER APPLICATION;  Surgeon: Alexis Frock, MD;  Location: [facility];  Service: Urology;  Laterality: Right;   kidney stone removal     LITHOTRIPSY     PARATHYROIDECTOMY Right 11/13/2016   Procedure: RIGHT SUPERIOR PARATHYROIDECTOMY;  Surgeon: Armandina Gemma, MD;  Location: [place];  Service: General;  Laterality: Right;   surgery  for,ectopic pregnancy  2011    1 fallopian tube rupture   TUBAL LIGATION  2011    Prior to Admission medications   Medication Sig Start Date End Date Taking? Authorizing Provider  progesterone (PROMETRIUM) 100 MG capsule Take 100 mg by mouth at bedtime. 09/12/21  Yes [provider]  propranolol (INDERAL) 10 MG tablet TAKE 1 TABLET BY MOUTH THREE TIMES DAILY AS NEEDED FOR SEVERE ANXIETY SYMPTOMS 09/11/18  Yes [provider]  acetaminophen (TYLENOL) 500 MG tablet Take 500 mg by mouth every 6 (six) hours as needed. Patient not taking: Reported on 11/08/2021    [provider]  albuterol (PROVENTIL HFA;VENTOLIN HFA) 108 (90 Base) MCG/ACT inhaler Inhale 2 puffs into the lungs every 6 (six) hours as needed for wheezing or shortness of breath. 03/04/18   Loletha Grayer, MD  albuterol (PROVENTIL) (2.5 MG/3ML) 0.083% nebulizer solution Take 3 mLs (2.5 mg total) by nebulization every 6 (six) hours as needed for wheezing or shortness of breath. 03/04/18   Loletha Grayer, MD  ARIPiprazole (ABILIFY) 10 MG tablet Take 1 tablet (10 mg total) by mouth daily. For mood Patient not taking: Reported on 11/08/2021 09/23/18   Ursula Alert, MD  citalopram (CELEXA) 10 MG tablet Take 1  tablet (10 mg total) by mouth daily with breakfast. For anxiety Patient not taking: Reported on 11/08/2021 10/23/18   Ursula Alert, MD  cyclobenzaprine (FLEXERIL) 5 MG tablet Take 1-2 tablets 3 times daily as needed Patient not taking: Reported on 11/08/2021 10/03/20   Laban Emperor, PA-C  diclofenac sodium (VOLTAREN) 1 % GEL Apply 4 g topically 4 (four) times daily. Patient not taking: Reported on 11/08/2021 07/30/18   Park Liter P, DO  diphenhydrAMINE (BENADRYL) 25 MG tablet Take 25 mg by mouth daily as needed for allergies.    [provider]  hydrOXYzine (VISTARIL) 25 MG capsule Take 1 capsule (25 mg total) by mouth 2 (two) times daily as needed (severe anxiety symptoms). Patient not  taking: Reported on 11/08/2021 10/23/18   Ursula Alert, MD  ibuprofen (ADVIL) 400 MG tablet Take 1 tablet (400 mg total) by mouth every 6 (six) hours as needed. Patient not taking: Reported on 11/08/2021 10/03/20   Laban Emperor, PA-C  naproxen (NAPROSYN) 500 MG tablet Take 1 tablet (500 mg total) by mouth 2 (two) times daily with a meal. Patient not taking: Reported on 11/08/2021 11/27/18   Park Liter P, DO  omeprazole (PRILOSEC) 20 MG capsule Take 1 capsule (20 mg total) by mouth daily. Patient not taking: Reported on 11/08/2021 09/26/18   Park Liter P, DO  ondansetron (ZOFRAN ODT) 4 MG disintegrating tablet Allow 1-2 tablets to dissolve in your mouth every 8 hours as needed for nausea/vomiting Patient not taking: Reported on 11/08/2021 03/26/20   Hinda Kehr, MD  orphenadrine (NORFLEX) 100 MG tablet Take 1 tablet (100 mg total) by mouth 2 (two) times daily. Patient not taking: Reported on 11/08/2021 05/23/21   Sable Feil, PA-C  oxyCODONE-acetaminophen (PERCOCET) 7.5-325 MG tablet Take 1 tablet by mouth every 4 (four) hours as needed for severe pain. Patient not taking: Reported on 11/08/2021 05/23/21 05/23/22  Sable Feil, PA-C  oxyCODONE-acetaminophen (PERCOCET/ROXICET) 5-325 MG tablet Take 1 tablet by mouth every 4 (four) hours as needed for severe pain. Patient not taking: Reported on 11/08/2021 03/16/20   Paulette Blanch, MD  traZODone (DESYREL) 150 MG tablet Take 1 tablet (150 mg total) by mouth at bedtime. Patient not taking: Reported on 11/08/2021 09/23/18   Ursula Alert, MD  Vitamin D, Ergocalciferol, (DRISDOL) 50000 units CAPS capsule Take 1 capsule (50,000 Units total) by mouth every 7 (seven) days. Patient not taking: Reported on 11/08/2021 07/31/18   Park Liter P, DO    Allergies Bee venom, Contrast media [iodinated diagnostic agents], Eggs or egg-derived products, Other, Penicillins, Wellbutrin [bupropion], Latex, and Oxycodone-acetaminophen  Family History   Problem Relation Age of Onset   Bell's palsy Mother    Heart failure Mother    Hypertension Mother    Lupus Mother    Anxiety disorder Mother    Depression Mother    Stroke Mother    Asthma Father    Hypertension Father    Hyperlipidemia Father    Anxiety disorder Father    Depression Father    Hypertension Maternal Grandmother    Diabetes Maternal Grandmother    Sarcoidosis Maternal Grandmother    Anxiety disorder Sister    Depression Sister    Bipolar disorder Sister    Depression Son    Bipolar disorder Son     Social History Social History   Tobacco Use   Smoking status: Never   Smokeless tobacco: Never  Vaping Use   Vaping Use: Never used  Substance Use Topics  Alcohol use: Not Currently   Drug use: No    Review of Systems  Constitutional: Positive for chills negative for fever/, weight loss, or fatigue.  Eyes: Negative for visual changes or discharge.  ENT: Negative for congestion, hearing changes, or sore throat.  Gastrointestinal: Positive for abdominal pain, flank pain, and vomiting. Genitourinary: Negative for dysuria or hematuria.  Musculoskeletal: Negative for back pain or joint pain.  Skin: Negative for rashes or lesions.  Neurological: Negative for headache, syncope, dizziness, tremors, or numbness/tingling.   10-point ROS otherwise negative. ____________________________________________   PHYSICAL EXAM:  VITAL SIGNS: ED Triage Vitals [11/08/21 1050]  Enc Vitals Group     BP      Pulse      Resp      Temp      Temp src      SpO2      Weight (!) 334 lb (151.5 kg)     Height 5\' 6"  (1.676 m)     Head Circumference      Peak Flow      Pain Score 7     Pain Loc      Pain Edu?      Excl. in [place]?     Physical Exam Constitutional:      Comments: Alert and oriented, but in notable distress.  Patient appears uncomfortable trying to find a position to minimize pain.  HENT:     Head: Normocephalic and atraumatic.     Nose: Nose normal.      Mouth/Throat:     Mouth: Mucous membranes are moist.     Pharynx: Oropharynx is clear.  Eyes:     Extraocular Movements: Extraocular movements intact.     Pupils: Pupils are equal, round, and reactive to light.  Cardiovascular:     Rate and Rhythm: Normal rate and regular rhythm.     Pulses: Normal pulses.     Heart sounds: Normal heart sounds.  Pulmonary:     Effort: Pulmonary effort is normal.     Breath sounds: Normal breath sounds. No wheezing, rhonchi or rales.  Abdominal:     Comments: Abdomen is soft and flat, though there is significant tenderness and guarding with minimal palpation of the left flank and central umbilicus.   Musculoskeletal:        General: Normal range of motion.     Cervical back: Normal range of motion.  Skin:    General: Skin is warm and dry.  Neurological:     General: No focal deficit present.     Mental Status: She is oriented to person, place, and time. Mental status is at baseline.  Psychiatric:        Mood and Affect: Mood normal.        Behavior: Behavior normal.        Thought Content: Thought content normal.        Judgment: Judgment normal.     ____________________________________________    LABS  (all labs ordered are listed, but only abnormal results are displayed)  Labs Reviewed  COMPREHENSIVE METABOLIC PANEL - Abnormal; Notable for the following components:      Result Value   Potassium 3.4 (*)    Glucose, Bld 137 (*)    Total Protein 8.5 (*)    All other components within normal limits  CBC - Abnormal; Notable for the following components:   WBC 138.4 (*)    RDW 20.4 (*)    nRBC 1.4 (*)    All  other components within normal limits  URINALYSIS, ROUTINE W REFLEX MICROSCOPIC - Abnormal; Notable for the following components:   Color, Urine YELLOW (*)    APPearance CLOUDY (*)    Hgb urine dipstick MODERATE (*)    Protein, ur 30 (*)    Bacteria, UA MANY (*)    All other components within normal limits  CBC WITH  DIFFERENTIAL/PLATELET - Abnormal; Notable for the following components:   WBC 133.7 (*)    Hemoglobin 11.4 (*)    RDW 20.3 (*)    nRBC 1.4 (*)    Neutro Abs 74.0 (*)    Lymphs Abs 18.6 (*)    Monocytes Absolute 3.9 (*)    Eosinophils Absolute 2.5 (*)    Basophils Absolute 6.2 (*)    Abs Immature Granulocytes 28.51 (*)    All other components within normal limits  LACTATE DEHYDROGENASE - Abnormal; Notable for the following components:   LDH 477 (*)    All other components within normal limits  PROTIME-INR - Abnormal; Notable for the following components:   Prothrombin Time 15.5 (*)    All other components within normal limits  BASIC METABOLIC PANEL - Abnormal; Notable for the following components:   Glucose, Bld 110 (*)    Creatinine, Ser 1.12 (*)    Calcium 8.5 (*)    GFR, Estimated 60 (*)    All other components within normal limits  CBC - Abnormal; Notable for the following components:   WBC 122.4 (*)    Hemoglobin 11.3 (*)    RDW 20.3 (*)    nRBC 1.2 (*)    All other components within normal limits  RESP PANEL BY RT-PCR (FLU A&B, COVID) ARPGX2  URINE CULTURE  CULTURE, BLOOD (ROUTINE X 2)  CULTURE, BLOOD (ROUTINE X 2)  LIPASE, BLOOD  LACTIC ACID, PLASMA  LACTIC ACID, PLASMA  MAGNESIUM  PATHOLOGIST SMEAR REVIEW  APTT  FIBRINOGEN  URIC ACID  PHOSPHORUS  HIV ANTIBODY (ROUTINE TESTING W REFLEX)  TSH  COMP PANEL: LEUKEMIA/LYMPHOMA  BCR-ABL1 FISH  BCR-ABL1, CML/ALL, PCR, QUANT  GLUCOSE 6 PHOSPHATE DEHYDROGENASE  POC URINE PREG, ED     ____________________________________________   EKG Sinus rhythm, rate of 88, no ST segment changes, AV blocks, or axis deviations.   ____________________________________________    RADIOLOGY I personally viewed and evaluated these images as part of my medical decision making, as well as reviewing the written report by the radiologist.  ED Provider Interpretation: I agree with the radiologist interpretation.  Moderate  hydronephrosis in the left kidney secondary to 1.1 x 0.9 x 1.4 calculus at the left ureteropelvic junction.  CT Abdomen Pelvis Wo Contrast  Result Date: 11/08/2021 CLINICAL DATA:  Left flank pain, kidney stone suspected. EXAM: CT ABDOMEN AND PELVIS WITHOUT CONTRAST TECHNIQUE: Multidetector CT imaging of the abdomen and pelvis was performed following the standard protocol without IV contrast. COMPARISON:  CT examination dated March 16, 2020 FINDINGS: Lower chest: No acute abnormality. Hepatobiliary: No focal liver abnormality is seen. Status post cholecystectomy. No biliary dilatation. Pancreas: Unremarkable. No pancreatic ductal dilatation or surrounding inflammatory changes. Spleen: Normal in size without focal abnormality. Adrenals/Urinary Tract: Adrenal glands are unremarkable. Moderate left hydronephrosis secondary to a 1.1 x 0.9 x 1.3 cm calculus at the left ureteropelvic junction/proximal left ureter. Mild left perinephric fat stranding. There is another punctate nonobstructing calculus in the upper pole of the left kidney. Simple cysts in the lower pole of the right kidney. No right hydroureteronephrosis. Right kidney and ureter bladder is  unremarkable. Stomach/Bowel: Stomach is within normal limits. Appendix appears normal. No evidence of bowel wall thickening, distention, or inflammatory changes. Vascular/Lymphatic: No significant vascular findings are present. No enlarged abdominal or pelvic lymph nodes. Reproductive: Uterus and bilateral adnexa are unremarkable. Other: No abdominal wall hernia or abnormality. No abdominopelvic ascites. Musculoskeletal: No acute or significant osseous findings. Mild multilevel degenerative disc disease of the lumbar spine. IMPRESSION: Moderate left hydronephrosis secondary to a 1.1 x 0.9 x 1.3 cm calculus at the left ureteropelvic junction/proximal left ureter. Electronically Signed   By: Keane Police D.O.   On: 11/08/2021 11:52   DG Chest Port 1 View  Result  Date: 11/08/2021 CLINICAL DATA:  Leukocytosis.  Low back and left flank pain. EXAM: PORTABLE CHEST 1 VIEW COMPARISON:  None. FINDINGS: The heart size and mediastinal contours are within normal limits. Low lung volumes without evidence of focal consolidation or pleural effusion. The visualized skeletal structures are unremarkable. IMPRESSION: No active disease. Electronically Signed   By: Keane Police D.O.   On: 11/08/2021 17:22    ____________________________________________   PROCEDURES  Procedures   Medications  HYDROmorphone (DILAUDID) injection 1 mg (1 mg Intravenous Given 11/09/21 0541)  propranolol (INDERAL) tablet 10 mg (has no administration in time range)  acetaminophen (TYLENOL) tablet 650 mg (has no administration in time range)    Or  acetaminophen (TYLENOL) suppository 650 mg (has no administration in time range)  ondansetron (ZOFRAN) tablet 4 mg (4 mg Oral Given 11/09/21 0551)    Or  ondansetron (ZOFRAN) injection 4 mg ( Intravenous See Alternative 11/09/21 0551)  enoxaparin (LOVENOX) injection 75 mg (75 mg Subcutaneous Given 11/08/21 2104)  ketorolac (TORADOL) 30 MG/ML injection 15 mg (15 mg Intravenous Given 11/08/21 2102)  cefTRIAXone (ROCEPHIN) 2 g in sodium chloride 0.9 % 100 mL IVPB (0 g Intravenous Stopped 11/08/21 1924)  tamsulosin (FLOMAX) capsule 0.4 mg (0.4 mg Oral Given 11/08/21 1957)  promethazine (PHENERGAN) 12.5 mg in sodium chloride 0.9 % 50 mL IVPB (has no administration in time range)  lactated ringers infusion ( Intravenous New Bag/Given 11/09/21 0442)  progesterone (PROMETRIUM) capsule 100 mg (100 mg Oral Patient Refused/Not Given 11/08/21 2105)  albuterol (PROVENTIL) (2.5 MG/3ML) 0.083% nebulizer solution 3 mL (has no administration in time range)  morphine 4 MG/ML injection 4 mg (4 mg Intravenous Given 11/08/21 1948)  ketorolac (TORADOL) 30 MG/ML injection 30 mg (30 mg Intravenous Given 11/08/21 1117)  ondansetron (ZOFRAN) injection 4 mg (4 mg  Intravenous Given 11/08/21 1115)  sodium chloride 0.9 % bolus 500 mL (0 mLs Intravenous Stopped 11/08/21 1315)  HYDROmorphone (DILAUDID) injection 1 mg (1 mg Intravenous Given 11/08/21 1154)  promethazine (PHENERGAN) 12.5 mg in sodium chloride 0.9 % 50 mL IVPB (0 mg Intravenous Stopped 11/08/21 1227)  potassium chloride 10 mEq in 100 mL IVPB (0 mEq Intravenous Stopped 11/08/21 1853)  HYDROmorphone (DILAUDID) injection 1 mg (1 mg Intravenous Given 11/08/21 1518)    Critical Care performed: No  ____________________________________________   INITIAL IMPRESSION / ASSESSMENT AND PLAN / ED COURSE  Pertinent labs & imaging results that were available during my care of the patient were reviewed by me and considered in my medical decision making (see chart for details).     Raysha Tilmon is a 50 y.o. female, hx of asthma,  PE, ectopic pregnancy, nephrolithiasis, and brain tumor, presents to the emergency department for evaluation of left flank pain.  Reports pain starting 3 weeks ago but has recently increased in severity over the past 24 hours.  Differentials include, but not limited to: Serious: appendicitis, ectopic pregnancy, ovarian torsion, diverticulitis, bowel obstruction, colitis Common: UTI/pyelonephritis, renal colic, ovarian cyst, endometriosis, hernia, fibroids   Patient appears notably uncomfortable and in significant pain.  Patient is hypertensive, but otherwise has stable vital signs.  Physical exam significant for tenderness and guarding with minimal palpation to the left flank and umbilicus.  We will treat initially with IV fluids, ondansetron 4 mg, and 30 mg ketorolac.  Pending labs and CT abdomen/pelvis.  CBC significant for white count of 138.  Patient states that she is not aware of any abnormal white counts like this in the past.  She does endorse history of brain tumor which was treated with radiation in the past.  No recent surgery or chemotherapy.   Pain/vomiting  refractory to initial treatment. Will add hydromorphone 1 mg and promethazine 12.5 mg.  CT shows moderate left hydronephrosis secondary to 1.1 x 0.9 x 1.3 calculus to left ureteropelvic junction.  Patient has a history of several large stones in the past requiring lithotripsy and right ureteral stent   Clinical Course as of 11/09/21 0900  Tue Nov 08, 2021  1337 Brief run of suspected V. tach noted on telemetry by nurse. Pt asymptomatic. Will order magnesium and provide potassium supplementation. [BS]    Clinical Course User Index [BS] Teodoro Spray, PA   Patient is currently stable able at this time. Urology and oncology consulted. Will  admit for further treatment and workup.   ____________________________________________   FINAL CLINICAL IMPRESSION(S) / ED DIAGNOSES  Final diagnoses:  Leukocytosis     NEW MEDICATIONS STARTED DURING THIS VISIT:  ED Discharge Orders     None        Note:  This document was prepared using Dragon voice recognition software and may include unintentional dictation errors.    Teodoro Spray, [place] 11/09/21 0900    Vanessa [place], MD 11/09/21 [phone]

## 2021-11-09 ENCOUNTER — Other Ambulatory Visit: Payer: Self-pay

## 2021-11-09 ENCOUNTER — Other Ambulatory Visit: Payer: Self-pay | Admitting: Urology

## 2021-11-09 ENCOUNTER — Encounter: Payer: Self-pay | Admitting: Internal Medicine

## 2021-11-09 DIAGNOSIS — Z825 Family history of asthma and other chronic lower respiratory diseases: Secondary | ICD-10-CM | POA: Diagnosis not present

## 2021-11-09 DIAGNOSIS — Z9049 Acquired absence of other specified parts of digestive tract: Secondary | ICD-10-CM | POA: Diagnosis not present

## 2021-11-09 DIAGNOSIS — K219 Gastro-esophageal reflux disease without esophagitis: Secondary | ICD-10-CM | POA: Diagnosis present

## 2021-11-09 DIAGNOSIS — N179 Acute kidney failure, unspecified: Secondary | ICD-10-CM | POA: Diagnosis present

## 2021-11-09 DIAGNOSIS — R109 Unspecified abdominal pain: Secondary | ICD-10-CM | POA: Diagnosis present

## 2021-11-09 DIAGNOSIS — Z8249 Family history of ischemic heart disease and other diseases of the circulatory system: Secondary | ICD-10-CM | POA: Diagnosis not present

## 2021-11-09 DIAGNOSIS — N136 Pyonephrosis: Secondary | ICD-10-CM | POA: Diagnosis present

## 2021-11-09 DIAGNOSIS — N1330 Unspecified hydronephrosis: Secondary | ICD-10-CM | POA: Diagnosis not present

## 2021-11-09 DIAGNOSIS — E785 Hyperlipidemia, unspecified: Secondary | ICD-10-CM | POA: Diagnosis present

## 2021-11-09 DIAGNOSIS — Z28310 Unvaccinated for covid-19: Secondary | ICD-10-CM | POA: Diagnosis not present

## 2021-11-09 DIAGNOSIS — Z833 Family history of diabetes mellitus: Secondary | ICD-10-CM | POA: Diagnosis not present

## 2021-11-09 DIAGNOSIS — I10 Essential (primary) hypertension: Secondary | ICD-10-CM | POA: Diagnosis present

## 2021-11-09 DIAGNOSIS — C9210 Chronic myeloid leukemia, BCR/ABL-positive, not having achieved remission: Secondary | ICD-10-CM | POA: Diagnosis present

## 2021-11-09 DIAGNOSIS — N201 Calculus of ureter: Secondary | ICD-10-CM

## 2021-11-09 DIAGNOSIS — G4733 Obstructive sleep apnea (adult) (pediatric): Secondary | ICD-10-CM | POA: Diagnosis present

## 2021-11-09 DIAGNOSIS — Z86011 Personal history of benign neoplasm of the brain: Secondary | ICD-10-CM | POA: Diagnosis not present

## 2021-11-09 DIAGNOSIS — Z20822 Contact with and (suspected) exposure to covid-19: Secondary | ICD-10-CM | POA: Diagnosis present

## 2021-11-09 DIAGNOSIS — Z6841 Body Mass Index (BMI) 40.0 and over, adult: Secondary | ICD-10-CM | POA: Diagnosis not present

## 2021-11-09 DIAGNOSIS — D72821 Monocytosis (symptomatic): Secondary | ICD-10-CM

## 2021-11-09 DIAGNOSIS — N200 Calculus of kidney: Secondary | ICD-10-CM | POA: Diagnosis not present

## 2021-11-09 DIAGNOSIS — G4700 Insomnia, unspecified: Secondary | ICD-10-CM | POA: Diagnosis present

## 2021-11-09 DIAGNOSIS — Z832 Family history of diseases of the blood and blood-forming organs and certain disorders involving the immune mechanism: Secondary | ICD-10-CM | POA: Diagnosis not present

## 2021-11-09 DIAGNOSIS — F411 Generalized anxiety disorder: Secondary | ICD-10-CM | POA: Diagnosis present

## 2021-11-09 DIAGNOSIS — Z86711 Personal history of pulmonary embolism: Secondary | ICD-10-CM | POA: Diagnosis not present

## 2021-11-09 DIAGNOSIS — Z823 Family history of stroke: Secondary | ICD-10-CM | POA: Diagnosis not present

## 2021-11-09 DIAGNOSIS — F32A Depression, unspecified: Secondary | ICD-10-CM | POA: Diagnosis present

## 2021-11-09 DIAGNOSIS — D72829 Elevated white blood cell count, unspecified: Secondary | ICD-10-CM | POA: Diagnosis not present

## 2021-11-09 DIAGNOSIS — E213 Hyperparathyroidism, unspecified: Secondary | ICD-10-CM | POA: Diagnosis present

## 2021-11-09 DIAGNOSIS — Z83438 Family history of other disorder of lipoprotein metabolism and other lipidemia: Secondary | ICD-10-CM | POA: Diagnosis not present

## 2021-11-09 LAB — BASIC METABOLIC PANEL
Anion gap: 5 (ref 5–15)
BUN: 9 mg/dL (ref 6–20)
CO2: 28 mmol/L (ref 22–32)
Calcium: 8.5 mg/dL — ABNORMAL LOW (ref 8.9–10.3)
Chloride: 106 mmol/L (ref 98–111)
Creatinine, Ser: 1.12 mg/dL — ABNORMAL HIGH (ref 0.44–1.00)
GFR, Estimated: 60 mL/min — ABNORMAL LOW (ref >60)
Glucose, Bld: 110 mg/dL — ABNORMAL HIGH (ref 70–99)
Potassium: 3.6 mmol/L (ref 3.5–5.1)
Sodium: 139 mmol/L (ref 135–145)

## 2021-11-09 LAB — CBC
HCT: 37.5 % (ref 36.0–46.0)
Hemoglobin: 11.3 g/dL — ABNORMAL LOW (ref 12.0–15.0)
MCH: 26.7 pg (ref 26.0–34.0)
MCHC: 30.1 g/dL (ref 30.0–36.0)
MCV: 88.4 fL (ref 80.0–100.0)
Platelets: 169 10*3/uL (ref 150–400)
RBC: 4.24 MIL/uL (ref 3.87–5.11)
RDW: 20.3 % — ABNORMAL HIGH (ref 11.5–15.5)
WBC: 122.4 10*3/uL (ref 4.0–10.5)
nRBC: 1.2 % — ABNORMAL HIGH (ref 0.0–0.2)

## 2021-11-09 MED ORDER — CALCIUM CARBONATE ANTACID 500 MG PO CHEW: 1.0000 | CHEWABLE_TABLET | Freq: Three times a day (TID) | ORAL | Status: DC | PRN

## 2021-11-09 MED ORDER — ALUM & MAG HYDROXIDE-SIMETH 200-200-20 MG/5ML PO SUSP
15.0000 mL | Freq: Four times a day (QID) | ORAL | Status: DC | PRN
  Administered 2021-11-09: 15 mL via ORAL
  Filled 2021-11-09: qty 30

## 2021-11-09 MED ORDER — HYDROXYUREA 500 MG PO CAPS
1000.0000 mg | ORAL_CAPSULE | Freq: Every day | ORAL | Status: DC
  Administered 2021-11-09 – 2021-11-11 (×3): 1000 mg via ORAL
  Filled 2021-11-09 (×3): qty 2

## 2021-11-09 MED ORDER — MORPHINE SULFATE 15 MG PO TABS
15.0000 mg | ORAL_TABLET | ORAL | Status: DC | PRN
  Administered 2021-11-09 – 2021-11-10 (×3): 15 mg via ORAL
  Filled 2021-11-09 (×3): qty 1

## 2021-11-09 MED ORDER — ALLOPURINOL 100 MG PO TABS
300.0000 mg | ORAL_TABLET | Freq: Every day | ORAL | Status: DC
  Administered 2021-11-10 – 2021-11-11 (×2): 300 mg via ORAL
  Filled 2021-11-09 (×2): qty 3

## 2021-11-09 MED ORDER — LACTATED RINGERS IV SOLN: INTRAVENOUS | Status: AC

## 2021-11-09 NOTE — Progress Notes (Signed)
Hematology/Oncology Consult note [facility]  [phone] [phone]  Patient Care Team: Berkley Harvey, NP as PCP - General (Nurse Practitioner)   Name of the patient: Carol Ortega  [id number]  06-09-1971   Date of visit: 11/09/2021  Interval history-patient evaluated by urology with plans for ureteroscopy as an outpatient.  No acute issues overnight.  Patient remains afebrile.  ECOG PS- 1 Pain scale- 2  Review of systems- Review of Systems  Constitutional:  Positive for malaise/fatigue. Negative for chills, fever and weight loss.  HENT:  Negative for congestion, ear discharge and nosebleeds.   Eyes:  Negative for blurred vision.  Respiratory:  Negative for cough, hemoptysis, sputum production, shortness of breath and wheezing.   Cardiovascular:  Negative for chest pain, palpitations, orthopnea and claudication.  Gastrointestinal:  Negative for abdominal pain, blood in stool, constipation, diarrhea, heartburn, melena, nausea and vomiting.  Genitourinary:  Negative for dysuria, flank pain, frequency, hematuria and urgency.  Musculoskeletal:  Negative for back pain, joint pain and myalgias.  Skin:  Negative for rash.  Neurological:  Negative for dizziness, tingling, focal weakness, seizures, weakness and headaches.  Endo/Heme/Allergies:  Does not bruise/bleed easily.  Psychiatric/Behavioral:  Negative for depression and suicidal ideas. The patient does not have insomnia.      Allergies  Allergen Reactions   Bee Venom Anaphylaxis   Contrast Media [Iodinated Diagnostic Agents] Anaphylaxis   Eggs Or Egg-Derived Products Anaphylaxis   Other Anaphylaxis    Surgical dye.. Pt states ok to take if previously taken benadryl or prednisone   Penicillins Hives and Other (See Comments)    Has patient had a PCN reaction causing immediate rash, facial/tongue/throat swelling, SOB or lightheadedness with hypotension: No Has patient had a PCN  reaction causing severe rash involving mucus membranes or skin necrosis: No Has patient had a PCN reaction that required hospitalization No Has patient had a PCN reaction occurring within the last 10 years: No If all of the above answers are "NO", then may proceed with Cephalosporin use.   Wellbutrin [Bupropion] Other (See Comments)    irritiability   Latex Rash   Oxycodone-Acetaminophen Itching     Past Medical History:  Diagnosis Date   Anemia    Anxiety    Asthma    seasonal   Benign brain tumor ([place])    Brain tumor ([place]) 2014   tx with radiation   Complication of anesthesia 1999   lung collapse after surgery for gallbladder   Depression    Ectopic pregnancy    Headache    migraines   Hoarseness of voice    Hypertension    Kidney stone    Obesity    Parathyroid abnormality ([place])    Pulmonary emboli ([place]) 2011   Pulmonary embolism ([facility])    Radiation    Brain tumor   UTI (urinary tract infection)    Vitamin D deficiency      Past Surgical History:  Procedure Laterality Date   CHOLECYSTECTOMY  1999   CYSTOSCOPY W/ RETROGRADES Left 03/01/2018   Procedure: CYSTOSCOPY WITH RETROGRADE PYELOGRAM;  Surgeon: Abbie Sons, MD;  Location: [facility];  Service: Urology;  Laterality: Left;   CYSTOSCOPY W/ URETERAL STENT PLACEMENT Right 04/08/2017   Procedure: CYSTOSCOPY WITH RETROGRADE PYELOGRAM/URETERAL STENT PLACEMENT;  Surgeon: Rana Snare, MD;  Location: [facility];  Service: Urology;  Laterality: Right;   CYSTOSCOPY W/ URETERAL STENT PLACEMENT Left 03/12/2018   Procedure: CYSTOSCOPY WITH STENT REPLACEMENT;  Surgeon: Bernardo Heater,  Ronda Fairly, MD;  Location: [facility];  Service: Urology;  Laterality: Left;   CYSTOSCOPY W/ URETEROSCOPY     CYSTOSCOPY WITH RETROGRADE PYELOGRAM, URETEROSCOPY AND STENT PLACEMENT Left 01/20/2014   Procedure: LEFT URETEROSCOPY WITH STENT PLACEMENT;  Surgeon: Irine Seal, MD;  Location: [facility];  Service: Urology;  Laterality: Left;   CYSTOSCOPY WITH RETROGRADE  PYELOGRAM, URETEROSCOPY AND STENT PLACEMENT Bilateral 04/23/2015   Procedure: CYSTOSCOPY WITH BILATERAL RETROGRADE PYELOGRAM, URETEROSCOPY AND STENT PLACEMENT;  Surgeon: Alexis Frock, MD;  Location: [facility];  Service: Urology;  Laterality: Bilateral;   CYSTOSCOPY WITH RETROGRADE PYELOGRAM, URETEROSCOPY AND STENT PLACEMENT Right 04/13/2017   Procedure: CYSTOSCOPY WITH RETROGRADE PYELOGRAM, URETEROSCOPY AND STENT EXCHANGE;  Surgeon: Alexis Frock, MD;  Location: [facility];  Service: Urology;  Laterality: Right;   CYSTOSCOPY WITH STENT PLACEMENT Left 01/12/2014   Procedure: CYSTOSCOPY WITH STENT PLACEMENT left retrograde;  Surgeon: Irine Seal, MD;  Location: [facility];  Service: Urology;  Laterality: Left;   CYSTOSCOPY WITH STENT PLACEMENT Left 03/01/2018   Procedure: CYSTOSCOPY WITH STENT PLACEMENT;  Surgeon: Abbie Sons, MD;  Location: [facility];  Service: Urology;  Laterality: Left;   CYSTOSCOPY/URETEROSCOPY/HOLMIUM LASER/STENT PLACEMENT Left 03/12/2018   Procedure: CYSTOSCOPY/URETEROSCOPY/HOLMIUM LASER;  Surgeon: Abbie Sons, MD;  Location: [facility];  Service: Urology;  Laterality: Left;   HOLMIUM LASER APPLICATION Left 01/19/1940   Procedure: HOLMIUM LASER APPLICATION;  Surgeon: Irine Seal, MD;  Location: [facility];  Service: Urology;  Laterality: Left;   HOLMIUM LASER APPLICATION Bilateral 06/20/813   Procedure: HOLMIUM LASER APPLICATION;  Surgeon: Alexis Frock, MD;  Location: [facility];  Service: Urology;  Laterality: Bilateral;   HOLMIUM LASER APPLICATION Right 4/81/8563   Procedure: HOLMIUM LASER APPLICATION;  Surgeon: Alexis Frock, MD;  Location: [facility];  Service: Urology;  Laterality: Right;   kidney stone removal     LITHOTRIPSY     PARATHYROIDECTOMY Right 11/13/2016   Procedure: RIGHT SUPERIOR PARATHYROIDECTOMY;  Surgeon: Armandina Gemma, MD;  Location: [place];  Service: General;  Laterality: Right;   surgery for,ectopic pregnancy  2011    1 fallopian tube rupture   TUBAL LIGATION  2011     Social History   Socioeconomic History   Marital status: Divorced    Spouse name: Not on file   Number of children: 4   Years of education: Not on file   Highest education level: Some college, no degree  Occupational History   Not on file  Tobacco Use   Smoking status: Never   Smokeless tobacco: Never  Vaping Use   Vaping Use: Never used  Substance and Sexual Activity   Alcohol use: Not Currently   Drug use: No   Sexual activity: Not Currently  Other Topics Concern   Not on file  Social History Narrative   Lives at home with her son, independent at baselinepend   Social Determinants of Health   Financial Resource Strain: Not on file  Food Insecurity: Not on file  Transportation Needs: Not on file  Physical Activity: Not on file  Stress: Not on file  Social Connections: Not on file  Intimate Partner Violence: Not on file    Family History  Problem Relation Age of Onset   Bell's palsy Mother    Heart failure Mother    Hypertension Mother    Lupus Mother    Anxiety disorder Mother    Depression Mother    Stroke Mother    Asthma Father    Hypertension Father    Hyperlipidemia  Father    Anxiety disorder Father    Depression Father    Hypertension Maternal Grandmother    Diabetes Maternal Grandmother    Sarcoidosis Maternal Grandmother    Anxiety disorder Sister    Depression Sister    Bipolar disorder Sister    Depression Son    Bipolar disorder Son      Current Facility-Administered Medications:    acetaminophen (TYLENOL) tablet 650 mg, 650 mg, Oral, Q6H PRN, 650 mg at 11/09/21 1528 **OR** acetaminophen (TYLENOL) suppository 650 mg, 650 mg, Rectal, Q6H PRN, Cox, Amy N, DO   albuterol (PROVENTIL) (2.5 MG/3ML) 0.083% nebulizer solution 3 mL, 3 mL, Nebulization, Q6H PRN, Cox, Amy N, DO   alum & mag hydroxide-simeth (MAALOX/MYLANTA) 200-200-20 MG/5ML suspension 15 mL, 15 mL, Oral, Q6H PRN, Enzo Bi, MD, 15 mL at 11/09/21 1938   calcium carbonate (TUMS -  dosed in mg elemental calcium) chewable tablet 200 mg of elemental calcium, 1 tablet, Oral, TID PRN, Enzo Bi, MD   cefTRIAXone (ROCEPHIN) 2 g in sodium chloride 0.9 % 100 mL IVPB, 2 g, Intravenous, Q24H, Cox, Amy N, DO, Last Rate: 200 mL/hr at 11/09/21 1525, 2 g at 11/09/21 1525   enoxaparin (LOVENOX) injection 75 mg, 0.5 mg/kg, Subcutaneous, Q24H, Cox, Amy N, DO, 75 mg at 11/09/21 1935   hydroxyurea (HYDREA) capsule 1,000 mg, 1,000 mg, Oral, Daily, Sindy Guadeloupe, MD, 1,000 mg at 11/09/21 1529   lactated ringers infusion, , Intravenous, Continuous, Enzo Bi, MD, Last Rate: 75 mL/hr at 11/09/21 1938, New Bag at 11/09/21 1938   morphine (MSIR) tablet 15 mg, 15 mg, Oral, Q4H PRN, Enzo Bi, MD, 15 mg at 11/09/21 1935   ondansetron (ZOFRAN) tablet 4 mg, 4 mg, Oral, Q6H PRN, 4 mg at 11/09/21 0551 **OR** ondansetron (ZOFRAN) injection 4 mg, 4 mg, Intravenous, Q6H PRN, Cox, Amy N, DO   progesterone (PROMETRIUM) capsule 100 mg, 100 mg, Oral, QHS, Cox, Amy N, DO   promethazine (PHENERGAN) 12.5 mg in sodium chloride 0.9 % 50 mL IVPB, 12.5 mg, Intravenous, Q8H PRN, Cox, Amy N, DO   propranolol (INDERAL) tablet 10 mg, 10 mg, Oral, TID PRN, Cox, Amy N, DO   tamsulosin (FLOMAX) capsule 0.4 mg, 0.4 mg, Oral, Daily, Cox, Amy N, DO, 0.4 mg at 11/09/21 0914  Physical exam:  Vitals:   11/09/21 0758 11/09/21 1204 11/09/21 1610 11/09/21 2027  BP: (!) 125/95 124/87 136/83 125/74  Pulse: 85 88 88 96  Resp: 18 18 18 14   Temp: (!) 97.5 F (36.4 C) 98.2 F (36.8 C) 98.1 F (36.7 C) 98.5 F (36.9 C)  TempSrc: Oral Oral Oral Oral  SpO2: 100% 100% 98% 97%  Weight:      Height:       Physical Exam Constitutional:      General: She is not in acute distress. HENT:     Head: Normocephalic.  Cardiovascular:     Rate and Rhythm: Normal rate and regular rhythm.     Heart sounds: Normal heart sounds.  Pulmonary:     Effort: Pulmonary effort is normal.     Breath sounds: Normal breath sounds.  Abdominal:      General: Bowel sounds are normal.     Palpations: Abdomen is soft.  Skin:    General: Skin is warm and dry.  Neurological:     Mental Status: She is alert and oriented to person, place, and time.     CMP Latest Ref Rng & Units 11/09/2021  Glucose  70 - 99 mg/dL 110(H)  BUN 6 - 20 mg/dL 9  Creatinine 0.44 - 1.00 mg/dL 1.12(H)  Sodium 135 - 145 mmol/L 139  Potassium 3.5 - 5.1 mmol/L 3.6  Chloride 98 - 111 mmol/L 106  CO2 22 - 32 mmol/L 28  Calcium 8.9 - 10.3 mg/dL 8.5(L)  Total Protein 6.5 - 8.1 g/dL -  Total Bilirubin 0.3 - 1.2 mg/dL -  Alkaline Phos 38 - 126 U/L -  AST 15 - 41 U/L -  ALT 0 - 44 U/L -   CBC Latest Ref Rng & Units 11/09/2021  WBC 4.0 - 10.5 K/uL 122.4(HH)  Hemoglobin 12.0 - 15.0 g/dL 11.3(L)  Hematocrit 36.0 - 46.0 % 37.5  Platelets 150 - 400 K/uL 169    @IMAGES @  CT Abdomen Pelvis Wo Contrast  Result Date: 11/08/2021 CLINICAL DATA:  Left flank pain, kidney stone suspected. EXAM: CT ABDOMEN AND PELVIS WITHOUT CONTRAST TECHNIQUE: Multidetector CT imaging of the abdomen and pelvis was performed following the standard protocol without IV contrast. COMPARISON:  CT examination dated March 16, 2020 FINDINGS: Lower chest: No acute abnormality. Hepatobiliary: No focal liver abnormality is seen. Status post cholecystectomy. No biliary dilatation. Pancreas: Unremarkable. No pancreatic ductal dilatation or surrounding inflammatory changes. Spleen: Normal in size without focal abnormality. Adrenals/Urinary Tract: Adrenal glands are unremarkable. Moderate left hydronephrosis secondary to a 1.1 x 0.9 x 1.3 cm calculus at the left ureteropelvic junction/proximal left ureter. Mild left perinephric fat stranding. There is another punctate nonobstructing calculus in the upper pole of the left kidney. Simple cysts in the lower pole of the right kidney. No right hydroureteronephrosis. Right kidney and ureter bladder is unremarkable. Stomach/Bowel: Stomach is within normal limits.  Appendix appears normal. No evidence of bowel wall thickening, distention, or inflammatory changes. Vascular/Lymphatic: No significant vascular findings are present. No enlarged abdominal or pelvic lymph nodes. Reproductive: Uterus and bilateral adnexa are unremarkable. Other: No abdominal wall hernia or abnormality. No abdominopelvic ascites. Musculoskeletal: No acute or significant osseous findings. Mild multilevel degenerative disc disease of the lumbar spine. IMPRESSION: Moderate left hydronephrosis secondary to a 1.1 x 0.9 x 1.3 cm calculus at the left ureteropelvic junction/proximal left ureter. Electronically Signed   By: Keane Police D.O.   On: 11/08/2021 11:52   DG Chest Port 1 View  Result Date: 11/08/2021 CLINICAL DATA:  Leukocytosis.  Low back and left flank pain. EXAM: PORTABLE CHEST 1 VIEW COMPARISON:  None. FINDINGS: The heart size and mediastinal contours are within normal limits. Low lung volumes without evidence of focal consolidation or pleural effusion. The visualized skeletal structures are unremarkable. IMPRESSION: No active disease. Electronically Signed   By: Keane Police D.O.   On: 11/08/2021 17:22     Assessment and plan- Patient is a 50 y.o. female admitted for left hydronephrosis secondary to obstructing left renal calculus and possible UTI.  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  Further work-up including peripheral flow cytometry, BCR ABL FISH and BCR ABL PCR testing is currently pending and will take another few days to result.  Urology plans to do outpatient intervention after 4 to 5 days.  I will therefore start patient on hydroxyurea 1 g daily which she can take for the next 4 days  and  stop on 11/13/2021.  I will also start her on allopurinol 300 mg daily starting tomorrow for tumor lysis syndrome prophylaxis.  Her LDH is elevated but there is no evidence of spontaneous tumor lysis.  Serum potassium uric acid and phosphorus are all normal.  She did have a spike in her creatinine which could also be secondary to underlying hydronephrosis.  If there is a rise in her creatinine tomorrow I recommend IV hydration and holding off on discharge until creatinine stabilizes.  Definitive management of her leukocytosis will be based on the results of her blood work.   Visit Diagnosis 1. 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 Leukocytosis      Dr. Randa Evens, MD, MPH [facility] [phone] 11/09/2021 8:42 PM

## 2021-11-09 NOTE — Consult Note (Signed)
Urology Consult  I have been asked to see the patient by Dr. Billie Ruddy, for evaluation and management of 1 cm stone at the left UPJ associated with moderate hydronephrosis.  Chief Complaint: left flank pain  History of Present Illness: Carol Ortega is a 50 y.o. year old female with a history of asthma, PE, hyperparathyroidism, nephrolithiasis and a brain tumor who presented to the ED on November 08, 2021 for the evaluation of left flank pain.  A CT renal stone study performed yesterday discovered moderate left hydronephrosis secondary to a 1 cm calculus at the left moderate obstruction and proximal left ureter associated with perinephric stranding.  Her serum creatinine was 0.76, her UA was concerning for infection, lactic acid was normal at 1.2, but her WBC count was found to be 138.4.    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  She has a prior history of nephrolithiasis and has been treated several times with ureteroscopy.  Her stone composition is calcium oxalate stones.  Today, she states the pain is well controlled with pain medication.  Patient denies any modifying or aggravating factors.  Patient denies any gross hematuria, dysuria or suprapubic/flank pain.  Patient denies any fevers, chills, nausea or vomiting.    Her labs from this morning noted a creatinine of 1.12, urine culture still pending and WBC count at 122.4.    VSS afebrile.   Past Medical History:  Diagnosis Date   Anemia    Anxiety    Asthma    seasonal   Benign brain tumor ([place])    Brain tumor ([place]) 2014   tx with radiation   Complication of anesthesia 1999   lung collapse after surgery for gallbladder   Depression    Ectopic pregnancy    Headache    migraines   Hoarseness of voice    Hypertension    Kidney stone    Obesity    Parathyroid abnormality ([place])    Pulmonary emboli ([place]) 2011   Pulmonary embolism ([facility])    Radiation     Brain tumor   UTI (urinary tract infection)    Vitamin D deficiency     Past Surgical History:  Procedure Laterality Date   CHOLECYSTECTOMY  1999   CYSTOSCOPY W/ RETROGRADES Left 03/01/2018   Procedure: CYSTOSCOPY WITH RETROGRADE PYELOGRAM;  Surgeon: Abbie Sons, MD;  Location: [facility];  Service: Urology;  Laterality: Left;   CYSTOSCOPY W/ URETERAL STENT PLACEMENT Right 04/08/2017   Procedure: CYSTOSCOPY WITH RETROGRADE PYELOGRAM/URETERAL STENT PLACEMENT;  Surgeon: Rana Snare, MD;  Location: [facility];  Service: Urology;  Laterality: Right;   CYSTOSCOPY W/ URETERAL STENT PLACEMENT Left 03/12/2018   Procedure: CYSTOSCOPY WITH STENT REPLACEMENT;  Surgeon: Abbie Sons, MD;  Location: [facility];  Service: Urology;  Laterality: Left;   CYSTOSCOPY W/ URETEROSCOPY     CYSTOSCOPY WITH RETROGRADE PYELOGRAM, URETEROSCOPY AND STENT PLACEMENT Left 01/20/2014   Procedure: LEFT URETEROSCOPY WITH STENT PLACEMENT;  Surgeon: Irine Seal, MD;  Location: [facility];  Service: Urology;  Laterality: Left;   CYSTOSCOPY WITH RETROGRADE PYELOGRAM, URETEROSCOPY AND STENT PLACEMENT Bilateral 04/23/2015   Procedure: CYSTOSCOPY WITH BILATERAL RETROGRADE PYELOGRAM, URETEROSCOPY AND STENT PLACEMENT;  Surgeon: Alexis Frock, MD;  Location: [facility];  Service: Urology;  Laterality: Bilateral;   CYSTOSCOPY WITH RETROGRADE PYELOGRAM, URETEROSCOPY AND STENT PLACEMENT Right 04/13/2017   Procedure: CYSTOSCOPY WITH RETROGRADE PYELOGRAM, URETEROSCOPY AND STENT EXCHANGE;  Surgeon: Alexis Frock, MD;  Location:  [facility];  Service: Urology;  Laterality: Right;   CYSTOSCOPY WITH STENT PLACEMENT Left 01/12/2014   Procedure: CYSTOSCOPY WITH STENT PLACEMENT left retrograde;  Surgeon: Irine Seal, MD;  Location: [facility];  Service: Urology;  Laterality: Left;   CYSTOSCOPY WITH STENT PLACEMENT Left 03/01/2018   Procedure: CYSTOSCOPY WITH STENT PLACEMENT;  Surgeon: Abbie Sons, MD;  Location: [facility];  Service: Urology;  Laterality: Left;    CYSTOSCOPY/URETEROSCOPY/HOLMIUM LASER/STENT PLACEMENT Left 03/12/2018   Procedure: CYSTOSCOPY/URETEROSCOPY/HOLMIUM LASER;  Surgeon: Abbie Sons, MD;  Location: [facility];  Service: Urology;  Laterality: Left;   HOLMIUM LASER APPLICATION Left 04/21/6567   Procedure: HOLMIUM LASER APPLICATION;  Surgeon: Irine Seal, MD;  Location[facility];  Service: Urology;  Laterality: Left;   HOLMIUM LASER APPLICATION Bilateral 12/20/7515   Procedure: HOLMIUM LASER APPLICATION;  Surgeon: Alexis Frock, MD;  Location: [facility];  Service: Urology;  Laterality: Bilateral;   HOLMIUM LASER APPLICATION Right 0/12/7492   Procedure: HOLMIUM LASER APPLICATION;  Surgeon: Alexis Frock, MD;  Location: [facility];  Service: Urology;  Laterality: Right;   kidney stone removal     LITHOTRIPSY     PARATHYROIDECTOMY Right 11/13/2016   Procedure: RIGHT SUPERIOR PARATHYROIDECTOMY;  Surgeon: Armandina Gemma, MD;  Location: [place];  Service: General;  Laterality: Right;   surgery for,ectopic pregnancy  2011    1 fallopian tube rupture   TUBAL LIGATION  2011    Home Medications:  No current facility-administered medications on file prior to encounter.   Current Outpatient Medications on File Prior to Encounter  Medication Sig Dispense Refill   progesterone (PROMETRIUM) 100 MG capsule Take 100 mg by mouth at bedtime.     propranolol (INDERAL) 10 MG tablet TAKE 1 TABLET BY MOUTH THREE TIMES DAILY AS NEEDED FOR SEVERE ANXIETY SYMPTOMS     acetaminophen (TYLENOL) 500 MG tablet Take 500 mg by mouth every 6 (six) hours as needed. (Patient not taking: Reported on 11/08/2021)     albuterol (PROVENTIL HFA;VENTOLIN HFA) 108 (90 Base) MCG/ACT inhaler Inhale 2 puffs into the lungs every 6 (six) hours as needed for wheezing or shortness of breath. 1 Inhaler 2   albuterol (PROVENTIL) (2.5 MG/3ML) 0.083% nebulizer solution Take 3 mLs (2.5 mg total) by nebulization every 6 (six) hours as needed for wheezing or shortness of breath. 75 mL 12    ARIPiprazole (ABILIFY) 10 MG tablet Take 1 tablet (10 mg total) by mouth daily. For mood (Patient not taking: Reported on 11/08/2021) 30 tablet 0   citalopram (CELEXA) 10 MG tablet Take 1 tablet (10 mg total) by mouth daily with breakfast. For anxiety (Patient not taking: Reported on 11/08/2021) 30 tablet 1   cyclobenzaprine (FLEXERIL) 5 MG tablet Take 1-2 tablets 3 times daily as needed (Patient not taking: Reported on 11/08/2021) 20 tablet 0   diclofenac sodium (VOLTAREN) 1 % GEL Apply 4 g topically 4 (four) times daily. (Patient not taking: Reported on 11/08/2021) 100 g 12   diphenhydrAMINE (BENADRYL) 25 MG tablet Take 25 mg by mouth daily as needed for allergies.     hydrOXYzine (VISTARIL) 25 MG capsule Take 1 capsule (25 mg total) by mouth 2 (two) times daily as needed (severe anxiety symptoms). (Patient not taking: Reported on 11/08/2021) 60 capsule 1   ibuprofen (ADVIL) 400 MG tablet Take 1 tablet (400 mg total) by mouth every 6 (six) hours as needed. (Patient not taking: Reported on 11/08/2021) 30 tablet 0   naproxen (NAPROSYN) 500 MG tablet Take 1 tablet (500  mg total) by mouth 2 (two) times daily with a meal. (Patient not taking: Reported on 11/08/2021) 60 tablet 0   omeprazole (PRILOSEC) 20 MG capsule Take 1 capsule (20 mg total) by mouth daily. (Patient not taking: Reported on 11/08/2021) 30 capsule 3   ondansetron (ZOFRAN ODT) 4 MG disintegrating tablet Allow 1-2 tablets to dissolve in your mouth every 8 hours as needed for nausea/vomiting (Patient not taking: Reported on 11/08/2021) 30 tablet 0   orphenadrine (NORFLEX) 100 MG tablet Take 1 tablet (100 mg total) by mouth 2 (two) times daily. (Patient not taking: Reported on 11/08/2021) 10 tablet 0   oxyCODONE-acetaminophen (PERCOCET) 7.5-325 MG tablet Take 1 tablet by mouth every 4 (four) hours as needed for severe pain. (Patient not taking: Reported on 11/08/2021) 20 tablet 0   oxyCODONE-acetaminophen (PERCOCET/ROXICET) 5-325 MG tablet Take  1 tablet by mouth every 4 (four) hours as needed for severe pain. (Patient not taking: Reported on 11/08/2021) 20 tablet 0   traZODone (DESYREL) 150 MG tablet Take 1 tablet (150 mg total) by mouth at bedtime. (Patient not taking: Reported on 11/08/2021) 30 tablet 1   Vitamin D, Ergocalciferol, (DRISDOL) 50000 units CAPS capsule Take 1 capsule (50,000 Units total) by mouth every 7 (seven) days. (Patient not taking: Reported on 11/08/2021) 12 capsule 1     Allergies:  Allergies  Allergen Reactions   Bee Venom Anaphylaxis   Contrast Media [Iodinated Diagnostic Agents] Anaphylaxis   Eggs Or Egg-Derived Products Anaphylaxis   Other Anaphylaxis    Surgical dye.. Pt states ok to take if previously taken benadryl or prednisone   Penicillins Hives and Other (See Comments)    Has patient had a PCN reaction causing immediate rash, facial/tongue/throat swelling, SOB or lightheadedness with hypotension: No Has patient had a PCN reaction causing severe rash involving mucus membranes or skin necrosis: No Has patient had a PCN reaction that required hospitalization No Has patient had a PCN reaction occurring within the last 10 years: No If all of the above answers are "NO", then may proceed with Cephalosporin use.   Wellbutrin [Bupropion] Other (See Comments)    irritiability   Latex Rash   Oxycodone-Acetaminophen Itching    Family History  Problem Relation Age of Onset   Bell's palsy Mother    Heart failure Mother    Hypertension Mother    Lupus Mother    Anxiety disorder Mother    Depression Mother    Stroke Mother    Asthma Father    Hypertension Father    Hyperlipidemia Father    Anxiety disorder Father    Depression Father    Hypertension Maternal Grandmother    Diabetes Maternal Grandmother    Sarcoidosis Maternal Grandmother    Anxiety disorder Sister    Depression Sister    Bipolar disorder Sister    Depression Son    Bipolar disorder Son     Social History:  reports that she  has never smoked. She has never used smokeless tobacco. She reports that she does not currently use alcohol. She reports that she does not use drugs.  ROS: A complete review of systems was performed.  All systems are negative except for pertinent findings as noted.  Physical Exam:  Vital signs in last 24 hours: Temp:  [97.5 F (36.4 C)-98.2 F (36.8 C)] 97.5 F (36.4 C) (11/23 0758) Pulse Rate:  [83-99] 85 (11/23 0758) Resp:  [16-23] 18 (11/23 0758) BP: (118-150)/(71-111) 125/95 (11/23 0758) SpO2:  [90 %-100 %] 100 % (  11/23 0758) Constitutional:  Alert and oriented, No acute distress HEENT: [place] AT, moist mucus membranes.  Trachea midline Cardiovascular: no clubbing, cyanosis, or edema. Respiratory: Normal respiratory effort GI: Abdomen is soft, nontender, nondistended GU: No CVA tenderness Neurologic: Grossly intact, no focal deficits, moving all 4 extremities Psychiatric: Normal mood and affect   Laboratory Data:  Recent Labs    11/08/21 1111 11/08/21 1228 11/09/21 0501  WBC 138.4* 133.7* 122.4*  HGB 12.1 11.4* 11.3*  HCT 39.5 37.6 37.5   Recent Labs    11/08/21 1111 11/09/21 0501  NA 137 139  K 3.4* 3.6  CL 105 106  CO2 24 28  GLUCOSE 137* 110*  BUN 10 9  CREATININE 0.76 1.12*  CALCIUM 9.0 8.5*   Recent Labs    11/08/21 1744  INR 1.2   No results for input(s): LABURIN in the last 72 hours. Results for orders placed or performed during the hospital encounter of 11/08/21  Resp Panel by RT-PCR (Flu A&B, Covid) Nasopharyngeal Swab     Status: None   Collection Time: 11/08/21  1:16 PM   Specimen: Nasopharyngeal Swab; Nasopharyngeal(NP) swabs in vial transport medium  Result Value Ref Range Status   SARS Coronavirus 2 by RT PCR NEGATIVE NEGATIVE Final    Comment: (NOTE) SARS-CoV-2 target nucleic acids are NOT DETECTED.  The SARS-CoV-2 RNA is generally detectable in upper respiratory specimens during the acute phase of infection. The lowest concentration of  SARS-CoV-2 viral copies this assay can detect is 138 copies/mL. A negative result does not preclude SARS-Cov-2 infection and should not be used as the sole basis for treatment or other patient management decisions. A negative result may occur with  improper specimen collection/handling, submission of specimen other than nasopharyngeal swab, presence of viral mutation(s) within the areas targeted by this assay, and inadequate number of viral copies(<138 copies/mL). A negative result must be combined with clinical observations, patient history, and epidemiological information. The expected result is Negative.  Fact Sheet for Patients:  EntrepreneurPulse.com.au  Fact Sheet for Healthcare Providers:  IncredibleEmployment.be  This test is no t yet approved or cleared by the [place] FDA and  has been authorized for detection and/or diagnosis of SARS-CoV-2 by FDA under an Emergency Use Authorization (EUA). This EUA will remain  in effect (meaning this test can be used) for the duration of the COVID-19 declaration under Section 564(b)(1) of the Act, 21 U.S.C.section 360bbb-3(b)(1), unless the authorization is terminated  or revoked sooner.       Influenza A by PCR NEGATIVE NEGATIVE Final   Influenza B by PCR NEGATIVE NEGATIVE Final    Comment: (NOTE) The Xpert Xpress SARS-CoV-2/FLU/RSV plus assay is intended as an aid in the diagnosis of influenza from Nasopharyngeal swab specimens and should not be used as a sole basis for treatment. Nasal washings and aspirates are unacceptable for Xpert Xpress SARS-CoV-2/FLU/RSV testing.  Fact Sheet for Patients: EntrepreneurPulse.com.au  Fact Sheet for Healthcare Providers: IncredibleEmployment.be  This test is not yet approved or cleared by the [place] FDA and has been authorized for detection and/or diagnosis of SARS-CoV-2 by FDA under an Emergency Use  Authorization (EUA). This EUA will remain in effect (meaning this test can be used) for the duration of the COVID-19 declaration under Section 564(b)(1) of the Act, 21 U.S.C. section 360bbb-3(b)(1), unless the authorization is terminated or revoked.  Performed at [facility], [address] [place], [place] [address]   Blood culture (routine x 2)  Status: None (Preliminary result)   Collection Time: 11/08/21  5:46 PM   Specimen: BLOOD  Result Value Ref Range Status   Specimen Description BLOOD LEFT ANTECUBITAL  Final   Special Requests   Final    BOTTLES DRAWN AEROBIC AND ANAEROBIC Blood Culture adequate volume   Culture  Setup Time PENDING  Incomplete   Culture   Final    NO GROWTH < 24 HOURS Performed at [facility], [address] [place], [place] [address]    Report Status PENDING  Incomplete  Blood culture (routine x 2)     Status: None (Preliminary result)   Collection Time: 11/08/21  7:29 PM   Specimen: BLOOD  Result Value Ref Range Status   Specimen Description BLOOD LEFT ANTECUBITAL  Final   Special Requests   Final    BOTTLES DRAWN AEROBIC AND ANAEROBIC Blood Culture adequate volume   Culture   Final    NO GROWTH < 24 HOURS Performed at [facility], [address], [place], [place] [address]    Report Status PENDING  Incomplete     Radiologic Imaging: CT Abdomen Pelvis Wo Contrast  Result Date: 11/08/2021 CLINICAL DATA:  Left flank pain, kidney stone suspected. EXAM: CT ABDOMEN AND PELVIS WITHOUT CONTRAST TECHNIQUE: Multidetector CT imaging of the abdomen and pelvis was performed following the standard protocol without IV contrast. COMPARISON:  CT examination dated March 16, 2020 FINDINGS: Lower chest: No acute abnormality. Hepatobiliary: No focal liver abnormality is seen. Status post cholecystectomy. No biliary dilatation. Pancreas: Unremarkable. No pancreatic ductal dilatation or surrounding inflammatory changes. Spleen:  Normal in size without focal abnormality. Adrenals/Urinary Tract: Adrenal glands are unremarkable. Moderate left hydronephrosis secondary to a 1.1 x 0.9 x 1.3 cm calculus at the left ureteropelvic junction/proximal left ureter. Mild left perinephric fat stranding. There is another punctate nonobstructing calculus in the upper pole of the left kidney. Simple cysts in the lower pole of the right kidney. No right hydroureteronephrosis. Right kidney and ureter bladder is unremarkable. Stomach/Bowel: Stomach is within normal limits. Appendix appears normal. No evidence of bowel wall thickening, distention, or inflammatory changes. Vascular/Lymphatic: No significant vascular findings are present. No enlarged abdominal or pelvic lymph nodes. Reproductive: Uterus and bilateral adnexa are unremarkable. Other: No abdominal wall hernia or abnormality. No abdominopelvic ascites. Musculoskeletal: No acute or significant osseous findings. Mild multilevel degenerative disc disease of the lumbar spine. IMPRESSION: Moderate left hydronephrosis secondary to a 1.1 x 0.9 x 1.3 cm calculus at the left ureteropelvic junction/proximal left ureter. Electronically Signed   By: Keane Police D.O.   On: 11/08/2021 11:52   DG Chest Port 1 View  Result Date: 11/08/2021 CLINICAL DATA:  Leukocytosis.  Low back and left flank pain. EXAM: PORTABLE CHEST 1 VIEW COMPARISON:  None. FINDINGS: The heart size and mediastinal contours are within normal limits. Low lung volumes without evidence of focal consolidation or pleural effusion. The visualized skeletal structures are unremarkable. IMPRESSION: No active disease. Electronically Signed   By: Keane Police D.O.   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  Plan:   -Discussed with patient that would like to do left ureteroscopy with laser lithotripsy with ureteral stent placement on outpatient basis (11/15/2021)  -Reviewed how the procedures performed, the risks involved  and stent discomfort which she is familiar with as she has had several of these procedures in the past -Continue broad-spectrum antibiotics for 10 days -Continue tamsulosin 0.4 mg daily for 30 days -Continue Percocet 7.5/325 prn for pain -Continue to trend creatinine and if AKI should worsen or she develops fever or intractable pain, we will consider emergent stent placement prior to Tuesday  11/09/2021, 11:33 AM  Zara Council, PA-C

## 2021-11-09 NOTE — Progress Notes (Unsigned)
Surgical Physician Order Form  * Scheduling expectation :  11/15/2021  with Dr. Bernardo Heater  *Length of Case:   *Clearance needed: no  *Anticoagulation Instructions: Hold all anticoagulants  *Aspirin Instructions: N/A  *Post-op visit Date/Instructions:   TBD  *Diagnosis: Left UPJ Stone  *Procedure: Ureteroscopy w/laser lithotripsy & stent placement/exchange (81275)  -Admit type: OUTpatient  -Anesthesia: General  -VTE Prophylaxis Standing Order SCD's       Other:   -Standing Lab Orders Per Anesthesia    Lab other: None  -Standing Test orders EKG/Chest x-ray per Anesthesia       Test other:   - Medications:     Cipro 400mg  IV   Other Instructions:

## 2021-11-09 NOTE — H&P (View-Only) (Signed)
Urology Consult  I have been asked to see the patient by Dr. Billie Ruddy, for evaluation and management of 1 cm stone at the left UPJ associated with moderate hydronephrosis.  Chief Complaint: left flank pain  History of Present Illness: Carol Ortega is a 50 y.o. year old female with a history of asthma, PE, hyperparathyroidism, nephrolithiasis and a brain tumor who presented to the ED on November 08, 2021 for the evaluation of left flank pain.  A CT renal stone study performed yesterday discovered moderate left hydronephrosis secondary to a 1 cm calculus at the left moderate obstruction and proximal left ureter associated with perinephric stranding.  Her serum creatinine was 0.76, her UA was concerning for infection, lactic acid was normal at 1.2, but her WBC count was found to be 138.4.    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  She has a prior history of nephrolithiasis and has been treated several times with ureteroscopy.  Her stone composition is calcium oxalate stones.  Today, she states the pain is well controlled with pain medication.  Patient denies any modifying or aggravating factors.  Patient denies any gross hematuria, dysuria or suprapubic/flank pain.  Patient denies any fevers, chills, nausea or vomiting.    Her labs from this morning noted a creatinine of 1.12, urine culture still pending and WBC count at 122.4.    VSS afebrile.   Past Medical History:  Diagnosis Date   Anemia    Anxiety    Asthma    seasonal   Benign brain tumor ([place])    Brain tumor ([place]) 2014   tx with radiation   Complication of anesthesia 1999   lung collapse after surgery for gallbladder   Depression    Ectopic pregnancy    Headache    migraines   Hoarseness of voice    Hypertension    Kidney stone    Obesity    Parathyroid abnormality ([place])    Pulmonary emboli ([place]) 2011   Pulmonary embolism ([facility])    Radiation     Brain tumor   UTI (urinary tract infection)    Vitamin D deficiency     Past Surgical History:  Procedure Laterality Date   CHOLECYSTECTOMY  1999   CYSTOSCOPY W/ RETROGRADES Left 03/01/2018   Procedure: CYSTOSCOPY WITH RETROGRADE PYELOGRAM;  Surgeon: Abbie Sons, MD;  Location: [facility];  Service: Urology;  Laterality: Left;   CYSTOSCOPY W/ URETERAL STENT PLACEMENT Right 04/08/2017   Procedure: CYSTOSCOPY WITH RETROGRADE PYELOGRAM/URETERAL STENT PLACEMENT;  Surgeon: Rana Snare, MD;  Location: [facility];  Service: Urology;  Laterality: Right;   CYSTOSCOPY W/ URETERAL STENT PLACEMENT Left 03/12/2018   Procedure: CYSTOSCOPY WITH STENT REPLACEMENT;  Surgeon: Abbie Sons, MD;  Location: [facility];  Service: Urology;  Laterality: Left;   CYSTOSCOPY W/ URETEROSCOPY     CYSTOSCOPY WITH RETROGRADE PYELOGRAM, URETEROSCOPY AND STENT PLACEMENT Left 01/20/2014   Procedure: LEFT URETEROSCOPY WITH STENT PLACEMENT;  Surgeon: Irine Seal, MD;  Location: [facility];  Service: Urology;  Laterality: Left;   CYSTOSCOPY WITH RETROGRADE PYELOGRAM, URETEROSCOPY AND STENT PLACEMENT Bilateral 04/23/2015   Procedure: CYSTOSCOPY WITH BILATERAL RETROGRADE PYELOGRAM, URETEROSCOPY AND STENT PLACEMENT;  Surgeon: Alexis Frock, MD;  Location: [facility];  Service: Urology;  Laterality: Bilateral;   CYSTOSCOPY WITH RETROGRADE PYELOGRAM, URETEROSCOPY AND STENT PLACEMENT Right 04/13/2017   Procedure: CYSTOSCOPY WITH RETROGRADE PYELOGRAM, URETEROSCOPY AND STENT EXCHANGE;  Surgeon: Alexis Frock, MD;  Location:  [facility];  Service: Urology;  Laterality: Right;   CYSTOSCOPY WITH STENT PLACEMENT Left 01/12/2014   Procedure: CYSTOSCOPY WITH STENT PLACEMENT left retrograde;  Surgeon: Irine Seal, MD;  Location: [facility];  Service: Urology;  Laterality: Left;   CYSTOSCOPY WITH STENT PLACEMENT Left 03/01/2018   Procedure: CYSTOSCOPY WITH STENT PLACEMENT;  Surgeon: Abbie Sons, MD;  Location: [facility];  Service: Urology;  Laterality: Left;    CYSTOSCOPY/URETEROSCOPY/HOLMIUM LASER/STENT PLACEMENT Left 03/12/2018   Procedure: CYSTOSCOPY/URETEROSCOPY/HOLMIUM LASER;  Surgeon: Abbie Sons, MD;  Location: [facility];  Service: Urology;  Laterality: Left;   HOLMIUM LASER APPLICATION Left 03/21/101   Procedure: HOLMIUM LASER APPLICATION;  Surgeon: Irine Seal, MD;  [facility];  Service: Urology;  Laterality: Left;   HOLMIUM LASER APPLICATION Bilateral 06/18/5365   Procedure: HOLMIUM LASER APPLICATION;  Surgeon: Alexis Frock, MD;  Location: [facility];  Service: Urology;  Laterality: Bilateral;   HOLMIUM LASER APPLICATION Right 4/40/3474   Procedure: HOLMIUM LASER APPLICATION;  Surgeon: Alexis Frock, MD;  Location: [facility];  Service: Urology;  Laterality: Right;   kidney stone removal     LITHOTRIPSY     PARATHYROIDECTOMY Right 11/13/2016   Procedure: RIGHT SUPERIOR PARATHYROIDECTOMY;  Surgeon: Armandina Gemma, MD;  Location: [place];  Service: General;  Laterality: Right;   surgery for,ectopic pregnancy  2011    1 fallopian tube rupture   TUBAL LIGATION  2011    Home Medications:  No current facility-administered medications on file prior to encounter.   Current Outpatient Medications on File Prior to Encounter  Medication Sig Dispense Refill   progesterone (PROMETRIUM) 100 MG capsule Take 100 mg by mouth at bedtime.     propranolol (INDERAL) 10 MG tablet TAKE 1 TABLET BY MOUTH THREE TIMES DAILY AS NEEDED FOR SEVERE ANXIETY SYMPTOMS     acetaminophen (TYLENOL) 500 MG tablet Take 500 mg by mouth every 6 (six) hours as needed. (Patient not taking: Reported on 11/08/2021)     albuterol (PROVENTIL HFA;VENTOLIN HFA) 108 (90 Base) MCG/ACT inhaler Inhale 2 puffs into the lungs every 6 (six) hours as needed for wheezing or shortness of breath. 1 Inhaler 2   albuterol (PROVENTIL) (2.5 MG/3ML) 0.083% nebulizer solution Take 3 mLs (2.5 mg total) by nebulization every 6 (six) hours as needed for wheezing or shortness of breath. 75 mL 12    ARIPiprazole (ABILIFY) 10 MG tablet Take 1 tablet (10 mg total) by mouth daily. For mood (Patient not taking: Reported on 11/08/2021) 30 tablet 0   citalopram (CELEXA) 10 MG tablet Take 1 tablet (10 mg total) by mouth daily with breakfast. For anxiety (Patient not taking: Reported on 11/08/2021) 30 tablet 1   cyclobenzaprine (FLEXERIL) 5 MG tablet Take 1-2 tablets 3 times daily as needed (Patient not taking: Reported on 11/08/2021) 20 tablet 0   diclofenac sodium (VOLTAREN) 1 % GEL Apply 4 g topically 4 (four) times daily. (Patient not taking: Reported on 11/08/2021) 100 g 12   diphenhydrAMINE (BENADRYL) 25 MG tablet Take 25 mg by mouth daily as needed for allergies.     hydrOXYzine (VISTARIL) 25 MG capsule Take 1 capsule (25 mg total) by mouth 2 (two) times daily as needed (severe anxiety symptoms). (Patient not taking: Reported on 11/08/2021) 60 capsule 1   ibuprofen (ADVIL) 400 MG tablet Take 1 tablet (400 mg total) by mouth every 6 (six) hours as needed. (Patient not taking: Reported on 11/08/2021) 30 tablet 0   naproxen (NAPROSYN) 500 MG tablet Take 1 tablet (500  mg total) by mouth 2 (two) times daily with a meal. (Patient not taking: Reported on 11/08/2021) 60 tablet 0   omeprazole (PRILOSEC) 20 MG capsule Take 1 capsule (20 mg total) by mouth daily. (Patient not taking: Reported on 11/08/2021) 30 capsule 3   ondansetron (ZOFRAN ODT) 4 MG disintegrating tablet Allow 1-2 tablets to dissolve in your mouth every 8 hours as needed for nausea/vomiting (Patient not taking: Reported on 11/08/2021) 30 tablet 0   orphenadrine (NORFLEX) 100 MG tablet Take 1 tablet (100 mg total) by mouth 2 (two) times daily. (Patient not taking: Reported on 11/08/2021) 10 tablet 0   oxyCODONE-acetaminophen (PERCOCET) 7.5-325 MG tablet Take 1 tablet by mouth every 4 (four) hours as needed for severe pain. (Patient not taking: Reported on 11/08/2021) 20 tablet 0   oxyCODONE-acetaminophen (PERCOCET/ROXICET) 5-325 MG tablet Take  1 tablet by mouth every 4 (four) hours as needed for severe pain. (Patient not taking: Reported on 11/08/2021) 20 tablet 0   traZODone (DESYREL) 150 MG tablet Take 1 tablet (150 mg total) by mouth at bedtime. (Patient not taking: Reported on 11/08/2021) 30 tablet 1   Vitamin D, Ergocalciferol, (DRISDOL) 50000 units CAPS capsule Take 1 capsule (50,000 Units total) by mouth every 7 (seven) days. (Patient not taking: Reported on 11/08/2021) 12 capsule 1     Allergies:  Allergies  Allergen Reactions   Bee Venom Anaphylaxis   Contrast Media [Iodinated Diagnostic Agents] Anaphylaxis   Eggs Or Egg-Derived Products Anaphylaxis   Other Anaphylaxis    Surgical dye.. Pt states ok to take if previously taken benadryl or prednisone   Penicillins Hives and Other (See Comments)    Has patient had a PCN reaction causing immediate rash, facial/tongue/throat swelling, SOB or lightheadedness with hypotension: No Has patient had a PCN reaction causing severe rash involving mucus membranes or skin necrosis: No Has patient had a PCN reaction that required hospitalization No Has patient had a PCN reaction occurring within the last 10 years: No If all of the above answers are "NO", then may proceed with Cephalosporin use.   Wellbutrin [Bupropion] Other (See Comments)    irritiability   Latex Rash   Oxycodone-Acetaminophen Itching    Family History  Problem Relation Age of Onset   Bell's palsy Mother    Heart failure Mother    Hypertension Mother    Lupus Mother    Anxiety disorder Mother    Depression Mother    Stroke Mother    Asthma Father    Hypertension Father    Hyperlipidemia Father    Anxiety disorder Father    Depression Father    Hypertension Maternal Grandmother    Diabetes Maternal Grandmother    Sarcoidosis Maternal Grandmother    Anxiety disorder Sister    Depression Sister    Bipolar disorder Sister    Depression Son    Bipolar disorder Son     Social History:  reports that she  has never smoked. She has never used smokeless tobacco. She reports that she does not currently use alcohol. She reports that she does not use drugs.  ROS: A complete review of systems was performed.  All systems are negative except for pertinent findings as noted.  Physical Exam:  Vital signs in last 24 hours: Temp:  [97.5 F (36.4 C)-98.2 F (36.8 C)] 97.5 F (36.4 C) (11/23 0758) Pulse Rate:  [83-99] 85 (11/23 0758) Resp:  [16-23] 18 (11/23 0758) BP: (118-150)/(71-111) 125/95 (11/23 0758) SpO2:  [90 %-100 %] 100 % (  11/23 0758) Constitutional:  Alert and oriented, No acute distress HEENT: [place] AT, moist mucus membranes.  Trachea midline Cardiovascular: no clubbing, cyanosis, or edema. Respiratory: Normal respiratory effort GI: Abdomen is soft, nontender, nondistended GU: No CVA tenderness Neurologic: Grossly intact, no focal deficits, moving all 4 extremities Psychiatric: Normal mood and affect   Laboratory Data:  Recent Labs    11/08/21 1111 11/08/21 1228 11/09/21 0501  WBC 138.4* 133.7* 122.4*  HGB 12.1 11.4* 11.3*  HCT 39.5 37.6 37.5   Recent Labs    11/08/21 1111 11/09/21 0501  NA 137 139  K 3.4* 3.6  CL 105 106  CO2 24 28  GLUCOSE 137* 110*  BUN 10 9  CREATININE 0.76 1.12*  CALCIUM 9.0 8.5*   Recent Labs    11/08/21 1744  INR 1.2   No results for input(s): LABURIN in the last 72 hours. Results for orders placed or performed during the hospital encounter of 11/08/21  Resp Panel by RT-PCR (Flu A&B, Covid) Nasopharyngeal Swab     Status: None   Collection Time: 11/08/21  1:16 PM   Specimen: Nasopharyngeal Swab; Nasopharyngeal(NP) swabs in vial transport medium  Result Value Ref Range Status   SARS Coronavirus 2 by RT PCR NEGATIVE NEGATIVE Final    Comment: (NOTE) SARS-CoV-2 target nucleic acids are NOT DETECTED.  The SARS-CoV-2 RNA is generally detectable in upper respiratory specimens during the acute phase of infection. The lowest concentration of  SARS-CoV-2 viral copies this assay can detect is 138 copies/mL. A negative result does not preclude SARS-Cov-2 infection and should not be used as the sole basis for treatment or other patient management decisions. A negative result may occur with  improper specimen collection/handling, submission of specimen other than nasopharyngeal swab, presence of viral mutation(s) within the areas targeted by this assay, and inadequate number of viral copies(<138 copies/mL). A negative result must be combined with clinical observations, patient history, and epidemiological information. The expected result is Negative.  Fact Sheet for Patients:  EntrepreneurPulse.com.au  Fact Sheet for Healthcare Providers:  IncredibleEmployment.be  This test is no t yet approved or cleared by the [place] FDA and  has been authorized for detection and/or diagnosis of SARS-CoV-2 by FDA under an Emergency Use Authorization (EUA). This EUA will remain  in effect (meaning this test can be used) for the duration of the COVID-19 declaration under Section 564(b)(1) of the Act, 21 U.S.C.section 360bbb-3(b)(1), unless the authorization is terminated  or revoked sooner.       Influenza A by PCR NEGATIVE NEGATIVE Final   Influenza B by PCR NEGATIVE NEGATIVE Final    Comment: (NOTE) The Xpert Xpress SARS-CoV-2/FLU/RSV plus assay is intended as an aid in the diagnosis of influenza from Nasopharyngeal swab specimens and should not be used as a sole basis for treatment. Nasal washings and aspirates are unacceptable for Xpert Xpress SARS-CoV-2/FLU/RSV testing.  Fact Sheet for Patients: EntrepreneurPulse.com.au  Fact Sheet for Healthcare Providers: IncredibleEmployment.be  This test is not yet approved or cleared by the [place] FDA and has been authorized for detection and/or diagnosis of SARS-CoV-2 by FDA under an Emergency Use  Authorization (EUA). This EUA will remain in effect (meaning this test can be used) for the duration of the COVID-19 declaration under Section 564(b)(1) of the Act, 21 U.S.C. section 360bbb-3(b)(1), unless the authorization is terminated or revoked.  Performed at [facility], [address], [place], [place] [address]   Blood culture (routine x 2)  Status: None (Preliminary result)   Collection Time: 11/08/21  5:46 PM   Specimen: BLOOD  Result Value Ref Range Status   Specimen Description BLOOD LEFT ANTECUBITAL  Final   Special Requests   Final    BOTTLES DRAWN AEROBIC AND ANAEROBIC Blood Culture adequate volume   Culture  Setup Time PENDING  Incomplete   Culture   Final    NO GROWTH < 24 HOURS Performed at [facility], [address] [place], [place] [address]    Report Status PENDING  Incomplete  Blood culture (routine x 2)     Status: None (Preliminary result)   Collection Time: 11/08/21  7:29 PM   Specimen: BLOOD  Result Value Ref Range Status   Specimen Description BLOOD LEFT ANTECUBITAL  Final   Special Requests   Final    BOTTLES DRAWN AEROBIC AND ANAEROBIC Blood Culture adequate volume   Culture   Final    NO GROWTH < 24 HOURS Performed at [facility], [address], [place], [place] [address]    Report Status PENDING  Incomplete     Radiologic Imaging: CT Abdomen Pelvis Wo Contrast  Result Date: 11/08/2021 CLINICAL DATA:  Left flank pain, kidney stone suspected. EXAM: CT ABDOMEN AND PELVIS WITHOUT CONTRAST TECHNIQUE: Multidetector CT imaging of the abdomen and pelvis was performed following the standard protocol without IV contrast. COMPARISON:  CT examination dated March 16, 2020 FINDINGS: Lower chest: No acute abnormality. Hepatobiliary: No focal liver abnormality is seen. Status post cholecystectomy. No biliary dilatation. Pancreas: Unremarkable. No pancreatic ductal dilatation or surrounding inflammatory changes. Spleen:  Normal in size without focal abnormality. Adrenals/Urinary Tract: Adrenal glands are unremarkable. Moderate left hydronephrosis secondary to a 1.1 x 0.9 x 1.3 cm calculus at the left ureteropelvic junction/proximal left ureter. Mild left perinephric fat stranding. There is another punctate nonobstructing calculus in the upper pole of the left kidney. Simple cysts in the lower pole of the right kidney. No right hydroureteronephrosis. Right kidney and ureter bladder is unremarkable. Stomach/Bowel: Stomach is within normal limits. Appendix appears normal. No evidence of bowel wall thickening, distention, or inflammatory changes. Vascular/Lymphatic: No significant vascular findings are present. No enlarged abdominal or pelvic lymph nodes. Reproductive: Uterus and bilateral adnexa are unremarkable. Other: No abdominal wall hernia or abnormality. No abdominopelvic ascites. Musculoskeletal: No acute or significant osseous findings. Mild multilevel degenerative disc disease of the lumbar spine. IMPRESSION: Moderate left hydronephrosis secondary to a 1.1 x 0.9 x 1.3 cm calculus at the left ureteropelvic junction/proximal left ureter. Electronically Signed   By: Keane Police D.O.   On: 11/08/2021 11:52   DG Chest Port 1 View  Result Date: 11/08/2021 CLINICAL DATA:  Leukocytosis.  Low back and left flank pain. EXAM: PORTABLE CHEST 1 VIEW COMPARISON:  None. FINDINGS: The heart size and mediastinal contours are within normal limits. Low lung volumes without evidence of focal consolidation or pleural effusion. The visualized skeletal structures are unremarkable. IMPRESSION: No active disease. Electronically Signed   By: Keane Police D.O.   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  Plan:   -Discussed with patient that would like to do left ureteroscopy with laser lithotripsy with ureteral stent placement on outpatient basis (11/15/2021)  -Reviewed how the procedures performed, the risks involved  and stent discomfort which she is familiar with as she has had several of these procedures in the past -Continue broad-spectrum antibiotics for 10 days -Continue tamsulosin 0.4 mg daily for 30 days -Continue Percocet 7.5/325 prn for pain -Continue to trend creatinine and if AKI should worsen or she develops fever or intractable pain, we will consider emergent stent placement prior to Tuesday  11/09/2021, 11:33 AM  Zara Council, PA-C

## 2021-11-09 NOTE — Progress Notes (Signed)
PROGRESS NOTE    Carol Carol Ortega  Carol Ortega DOB: 13-Jul-1971 DOA: 11/08/2021 PCP: Berkley Harvey, NP  104A/104A-AA   Assessment & Plan:   Principal Problem:   Leukocytosis Active Problems:   Hyperparathyroidism ([place])   Kidney stone   Morbid obesity due to excess calories (HCC)   Depression, recurrent ([place])   GAD (generalized anxiety disorder)   Insomnia   Adult body mass index 50.0-59.9 ([place])   Anxiety state   Essential hypertension   Hyperlipidemia   Obstructive sleep apnea   S/P laparoscopic cholecystectomy   Brain tumor ([place])   Left flank pain   Carol Carol Ortega is a 50 y.o. female with medical history significant for hypertension, anxiety, depression, morbid obesity, presumed obstructive sleep apnea, who presents to the emergency department for chief concerns of left flank pain.     # Left flank pain secondary to calculus in the left ureteropelvic junction/proximal left ureter --urology consulted --plan for outpatient stone treatment next Tue --transition from IV to oral pain med  UTI or possible pyelo --started on ceftriaxone --cont ceftriaxone pending urine cx  AKI likely 2/2 left moderate hydronephrosis --monitor Cr --cont MIVF  # Severe markedly elevated leukocytosis-present on admission - Etiology work-up in progress, prelim finding more consistent with CML --oncology consulted, Dr. Janese Banks  --start hydrea today, per oncology   # Abnormal bowel gas pattern per symptoms and bowel movements - Patient has never had a colonoscopy - Extensive discussion with patient that she will need an outpatient colonoscopy   # Morbid obesity # With presumed obstructive sleep apnea-CPAP nightly ordered   # Anxiety/depression - Patient states that she is no longer taking Abilify, citalopram, trazodone - Continue outpatient follow-up with PCP and/or behavioral   # History of GERD -patient states she is no longer taking omeprazole   # History of chronic  pain -patient states she is no longer taking Flexeril    DVT prophylaxis: Lovenox SQ Code Status: Full code  Family Communication:  Level of care: Med-Surg Dispo:   The patient is from: home Anticipated d/c is to: home Anticipated d/c date is: likely tomorrow Patient currently is not medically ready to d/c due to: Cr trending up, now AKI   Subjective and Interval History:  Left flank pain controlled with IV morphine.  Pt reported feeling "gassy".  Not much urine output.   Objective: Vitals:   11/09/21 0758 11/09/21 1204 11/09/21 1610 11/09/21 2027  BP: (!) 125/95 124/87 136/83 125/74  Pulse: 85 88 88 96  Resp: 18 18 18 14   Temp: (!) 97.5 F (36.4 C) 98.2 F (36.8 C) 98.1 F (36.7 C) 98.5 F (36.9 C)  TempSrc: Oral Oral Oral Oral  SpO2: 100% 100% 98% 97%  Weight:      Height:        Intake/Output Summary (Last 24 hours) at 11/10/2021 0420 Last data filed at 11/10/2021 0317 Gross per 24 hour  Intake 1252.61 ml  Output 400 ml  Net 852.61 ml   Filed Weights   11/08/21 1050  Weight: (!) 151.5 kg    Examination:   Constitutional: NAD, AAOx3 HEENT: conjunctivae and lids normal, EOMI CV: No cyanosis.   RESP: normal respiratory effort, on RA Extremities: No effusions, edema in BLE SKIN: warm, dry Neuro: II - XII grossly intact.   Psych: Normal mood and affect.  Appropriate judgement and reason   Data Reviewed: I have personally reviewed following labs and imaging studies  CBC: Recent Labs  Lab 11/08/21 1111 11/08/21 1228  11/09/21 0501  WBC 138.4* 133.7* 122.4*  NEUTROABS  --  74.0*  --   HGB 12.1 11.4* 11.3*  HCT 39.5 37.6 37.5  MCV 87.2 88.1 88.4  PLT 190 176 235   Basic Metabolic Panel: Recent Labs  Lab 11/08/21 1111 11/08/21 1744 11/09/21 0501  NA 137  --  139  K 3.4*  --  3.6  CL 105  --  106  CO2 24  --  28  GLUCOSE 137*  --  110*  BUN 10  --  9  CREATININE 0.76  --  1.12*  CALCIUM 9.0  --  8.5*  MG 2.2  --   --   PHOS  --  4.5  --     GFR: Estimated Creatinine Clearance: 91.3 mL/min (A) (by C-G formula based on SCr of 1.12 mg/dL (H)). Liver Function Tests: Recent Labs  Lab 11/08/21 1111  AST 23  ALT 15  ALKPHOS 76  BILITOT 0.8  PROT 8.5*  ALBUMIN 4.0   Recent Labs  Lab 11/08/21 1111  LIPASE 28   No results for input(s): AMMONIA in the last 168 hours. Coagulation Profile: Recent Labs  Lab 11/08/21 1744  INR 1.2   Cardiac Enzymes: No results for input(s): CKTOTAL, CKMB, CKMBINDEX, TROPONINI in the last 168 hours. BNP (last 3 results) No results for input(s): PROBNP in the last 8760 hours. HbA1C: No results for input(s): HGBA1C in the last 72 hours. CBG: No results for input(s): GLUCAP in the last 168 hours. Lipid Profile: No results for input(s): CHOL, HDL, LDLCALC, TRIG, CHOLHDL, LDLDIRECT in the last 72 hours. Thyroid Function Tests: Recent Labs    11/08/21 1744  TSH 1.152   Anemia Panel: No results for input(s): VITAMINB12, FOLATE, FERRITIN, TIBC, IRON, RETICCTPCT in the last 72 hours. Sepsis Labs: Recent Labs  Lab 11/08/21 1136 11/08/21 1744  LATICACIDVEN 1.2 1.0    Recent Results (from the past 240 hour(s))  Resp Panel by RT-PCR (Flu A&B, Covid) Nasopharyngeal Swab     Status: None   Collection Time: 11/08/21  1:16 PM   Specimen: Nasopharyngeal Swab; Nasopharyngeal(NP) swabs in vial transport medium  Result Value Ref Range Status   SARS Coronavirus 2 by RT PCR NEGATIVE NEGATIVE Final    Comment: (NOTE) SARS-CoV-2 target nucleic acids are NOT DETECTED.  The SARS-CoV-2 RNA is generally detectable in upper respiratory specimens during the acute phase of infection. The lowest concentration of SARS-CoV-2 viral copies this assay can detect is 138 copies/mL. A negative result does not preclude SARS-Cov-2 infection and should not be used as the sole basis for treatment or other patient management decisions. A negative result may occur with  improper specimen collection/handling,  submission of specimen other than nasopharyngeal swab, presence of viral mutation(s) within the areas targeted by this assay, and inadequate number of viral copies(<138 copies/mL). A negative result must be combined with clinical observations, patient history, and epidemiological information. The expected result is Negative.  Fact Sheet for Patients:  EntrepreneurPulse.com.au  Fact Sheet for Healthcare Providers:  IncredibleEmployment.be  This test is no t yet approved or cleared by the [place] FDA and  has been authorized for detection and/or diagnosis of SARS-CoV-2 by FDA under an Emergency Use Authorization (EUA). This EUA will remain  in effect (meaning this test can be used) for the duration of the COVID-19 declaration under Section 564(b)(1) of the Act, 21 U.S.C.section 360bbb-3(b)(1), unless the authorization is terminated  or revoked sooner.       Influenza  A by PCR NEGATIVE NEGATIVE Final   Influenza B by PCR NEGATIVE NEGATIVE Final    Comment: (NOTE) The Xpert Xpress SARS-CoV-2/FLU/RSV plus assay is intended as an aid in the diagnosis of influenza from Nasopharyngeal swab specimens and should not be used as a sole basis for treatment. Nasal washings and aspirates are unacceptable for Xpert Xpress SARS-CoV-2/FLU/RSV testing.  Fact Sheet for Patients: EntrepreneurPulse.com.au  Fact Sheet for Healthcare Providers: IncredibleEmployment.be  This test is not yet approved or cleared by the [place] FDA and has been authorized for detection and/or diagnosis of SARS-CoV-2 by FDA under an Emergency Use Authorization (EUA). This EUA will remain in effect (meaning this test can be used) for the duration of the COVID-19 declaration under Section 564(b)(1) of the Act, 21 U.S.C. section 360bbb-3(b)(1), unless the authorization is terminated or revoked.  Performed at [facility], [address] [place], [place] [address]   Blood culture (routine x 2)     Status: None (Preliminary result)   Collection Time: 11/08/21  5:46 PM   Specimen: BLOOD  Result Value Ref Range Status   Specimen Description BLOOD LEFT ANTECUBITAL  Final   Special Requests   Final    BOTTLES DRAWN AEROBIC AND ANAEROBIC Blood Culture adequate volume   Culture  Setup Time PENDING  Incomplete   Culture   Final    NO GROWTH < 24 HOURS Performed at [facility], [address], [place], [place] [address]    Report Status PENDING  Incomplete  Blood culture (routine x 2)     Status: None (Preliminary result)   Collection Time: 11/08/21  7:29 PM   Specimen: BLOOD  Result Value Ref Range Status   Specimen Description BLOOD LEFT ANTECUBITAL  Final   Special Requests   Final    BOTTLES DRAWN AEROBIC AND ANAEROBIC Blood Culture adequate volume   Culture   Final    NO GROWTH < 24 HOURS Performed at [facility], [address] [place], [place] [address]    Report Status PENDING  Incomplete      Radiology Studies: CT Abdomen Pelvis Wo Contrast  Result Date: 11/08/2021 CLINICAL DATA:  Left flank pain, kidney stone suspected. EXAM: CT ABDOMEN AND PELVIS WITHOUT CONTRAST TECHNIQUE: Multidetector CT imaging of the abdomen and pelvis was performed following the standard protocol without IV contrast. COMPARISON:  CT examination dated March 16, 2020 FINDINGS: Lower chest: No acute abnormality. Hepatobiliary: No focal liver abnormality is seen. Status post cholecystectomy. No biliary dilatation. Pancreas: Unremarkable. No pancreatic ductal dilatation or surrounding inflammatory changes. Spleen: Normal in size without focal abnormality. Adrenals/Urinary Tract: Adrenal glands are unremarkable. Moderate left hydronephrosis secondary to a 1.1 x 0.9 x 1.3 cm calculus at the left ureteropelvic junction/proximal left ureter. Mild left perinephric fat stranding. There is another punctate  nonobstructing calculus in the upper pole of the left kidney. Simple cysts in the lower pole of the right kidney. No right hydroureteronephrosis. Right kidney and ureter bladder is unremarkable. Stomach/Bowel: Stomach is within normal limits. Appendix appears normal. No evidence of bowel wall thickening, distention, or inflammatory changes. Vascular/Lymphatic: No significant vascular findings are present. No enlarged abdominal or pelvic lymph nodes. Reproductive: Uterus and bilateral adnexa are unremarkable. Other: No abdominal wall hernia or abnormality. No abdominopelvic ascites. Musculoskeletal: No acute or significant osseous findings. Mild multilevel degenerative disc disease of the lumbar spine. IMPRESSION: Moderate left hydronephrosis secondary to a 1.1 x 0.9 x 1.3 cm calculus at the left ureteropelvic junction/proximal  left ureter. Electronically Signed   By: Keane Police D.O.   On: 11/08/2021 11:52   DG Chest Port 1 View  Result Date: 11/08/2021 CLINICAL DATA:  Leukocytosis.  Low back and left flank pain. EXAM: PORTABLE CHEST 1 VIEW COMPARISON:  None. FINDINGS: The heart size and mediastinal contours are within normal limits. Low lung volumes without evidence of focal consolidation or pleural effusion. The visualized skeletal structures are unremarkable. IMPRESSION: No active disease. Electronically Signed   By: Keane Police D.O.   On: 11/08/2021 17:22     Scheduled Meds:  allopurinol  300 mg Oral Daily   enoxaparin (LOVENOX) injection  0.5 mg/kg Subcutaneous Q24H   hydroxyurea  1,000 mg Oral Daily   progesterone  100 mg Oral QHS   tamsulosin  0.4 mg Oral Daily   Continuous Infusions:  cefTRIAXone (ROCEPHIN)  IV Stopped (11/09/21 1555)   lactated ringers 75 mL/hr at 11/10/21 0317   promethazine (PHENERGAN) injection (IM or IVPB)       LOS: 1 day     Enzo Bi, MD [facility] If 7PM-7AM, please contact night-coverage 11/10/2021, 4:20 AM

## 2021-11-09 NOTE — Progress Notes (Unsigned)
Surgical Physician Order Form  * Scheduling expectation :  Tuesday   11/15/21  DR. Bernardo Heater   *Length of Case:   *Clearance needed: no  *Anticoagulation Instructions: Hold all anticoagulants  *Aspirin Instructions: Ok to continue Aspirin  *Post-op visit Date/Instructions:  1 month with RUS prior  *Diagnosis: LEFT ureteral stone  *Procedure: Ureteroscopy w/laser lithotripsy & stent placement/exchange (91980)  -Admit type: OUTpatient  -Anesthesia: General  -VTE Prophylaxis Standing Order SCD's       Other:   -Standing Lab Orders Per Anesthesia    Lab other: None  -Standing Test orders EKG/Chest x-ray per Anesthesia       Test other:   - Medications:     Ancef 2gm IV   Other Instructions:

## 2021-11-10 DIAGNOSIS — D72821 Monocytosis (symptomatic): Secondary | ICD-10-CM | POA: Diagnosis not present

## 2021-11-10 LAB — BASIC METABOLIC PANEL
Anion gap: 6 (ref 5–15)
BUN: 10 mg/dL (ref 6–20)
CO2: 26 mmol/L (ref 22–32)
Calcium: 8.4 mg/dL — ABNORMAL LOW (ref 8.9–10.3)
Chloride: 106 mmol/L (ref 98–111)
Creatinine, Ser: 1.34 mg/dL — ABNORMAL HIGH (ref 0.44–1.00)
GFR, Estimated: 48 mL/min — ABNORMAL LOW (ref >60)
Glucose, Bld: 104 mg/dL — ABNORMAL HIGH (ref 70–99)
Potassium: 4 mmol/L (ref 3.5–5.1)
Sodium: 138 mmol/L (ref 135–145)

## 2021-11-10 LAB — CBC
HCT: 34.3 % — ABNORMAL LOW (ref 36.0–46.0)
Hemoglobin: 10.2 g/dL — ABNORMAL LOW (ref 12.0–15.0)
MCH: 26 pg (ref 26.0–34.0)
MCHC: 29.7 g/dL — ABNORMAL LOW (ref 30.0–36.0)
MCV: 87.5 fL (ref 80.0–100.0)
Platelets: 167 10*3/uL (ref 150–400)
RBC: 3.92 MIL/uL (ref 3.87–5.11)
RDW: 20.1 % — ABNORMAL HIGH (ref 11.5–15.5)
WBC: 106.5 10*3/uL (ref 4.0–10.5)
nRBC: 0.8 % — ABNORMAL HIGH (ref 0.0–0.2)

## 2021-11-10 LAB — URINE CULTURE: Culture: 50000 — AB

## 2021-11-10 LAB — MAGNESIUM: Magnesium: 2.2 mg/dL (ref 1.7–2.4)

## 2021-11-10 MED ORDER — MORPHINE SULFATE (PF) 4 MG/ML IV SOLN: 4.0000 mg | INTRAVENOUS | Status: DC | PRN

## 2021-11-10 MED ORDER — POLYETHYLENE GLYCOL 3350 17 G PO PACK
17.0000 g | PACK | Freq: Two times a day (BID) | ORAL | Status: DC
  Administered 2021-11-10: 17 g via ORAL
  Filled 2021-11-10: qty 1

## 2021-11-10 MED ORDER — LACTATED RINGERS IV SOLN: INTRAVENOUS | Status: AC

## 2021-11-10 MED ORDER — HYDROMORPHONE HCL 1 MG/ML IJ SOLN
0.5000 mg | Freq: Once | INTRAMUSCULAR | Status: AC
  Administered 2021-11-10: 0.5 mg via INTRAVENOUS
  Filled 2021-11-10: qty 1

## 2021-11-10 MED ORDER — MORPHINE SULFATE 15 MG PO TABS
30.0000 mg | ORAL_TABLET | ORAL | Status: DC | PRN
  Administered 2021-11-10 – 2021-11-11 (×4): 30 mg via ORAL
  Filled 2021-11-10 (×5): qty 2

## 2021-11-10 NOTE — Progress Notes (Signed)
PROGRESS NOTE    Carol Ortega  LGX:[id number] DOB: February 18, 1971 DOA: 11/08/2021 PCP: Carol Harvey, NP  104A/104A-AA   Assessment & Plan:   Principal Problem:   Leukocytosis Active Problems:   Hyperparathyroidism ([place])   Kidney stone   Morbid obesity due to excess calories (HCC)   Depression, recurrent ([place])   GAD (generalized anxiety disorder)   Insomnia   Adult body mass index 50.0-59.9 ([place])   Anxiety state   Essential hypertension   Hyperlipidemia   Obstructive sleep apnea   S/P laparoscopic cholecystectomy   Brain tumor ([place])   Left flank pain   Carol Ortega is a 50 y.o. female with medical history significant for hypertension, anxiety, depression, morbid obesity, presumed obstructive sleep apnea, who presents to the emergency department for chief concerns of left flank pain.     # Left flank pain secondary to calculus in the left ureteropelvic junction/proximal left ureter --urology consulted --plan for outpatient stone treatment next Tue --both oral and IV morphine PRN for pain  UTI or possible pyelo --cont ceftriaxone, day 3  AKI likely 2/2 left moderate hydronephrosis --Cr still trending up --cont MIVF  # Severe markedly elevated leukocytosis-present on admission - Etiology work-up in progress, prelim finding more consistent with CML --oncology consulted, Dr. Janese Banks  --started hydrea on 11/23, per oncology Plan: --cont hydrea --start allopurinol today, per oncology   # Morbid obesity # presumed obstructive sleep apnea-CPAP nightly ordered   # Anxiety/depression - Patient states that she is no longer taking Abilify, citalopram, trazodone - Continue outpatient follow-up with PCP and/or behavioral   # History of GERD -patient states she is no longer taking omeprazole   # History of chronic pain -patient states she is no longer taking Flexeril    DVT prophylaxis: Lovenox SQ Code Status: Full code  Family Communication:  Level of care:  Med-Surg Dispo:   The patient is from: home Anticipated d/c is to: home Anticipated d/c date is: 1-2 days Patient currently is not medically ready to d/c due to: Cr trending up still   Subjective and Interval History:  Overnight, pt had more pain in her left flank, which made her nauseated.  Still feeling gassy.   Objective: Vitals:   11/10/21 0557 11/10/21 0744 11/10/21 1129 11/10/21 1535  BP: (!) 142/98 128/85 (!) 126/91 (!) 139/92  Pulse: (!) 106 (!) 104 (!) 108 (!) 57  Resp: 16 18 18 18   Temp: 98.1 F (36.7 C) (!) 97.5 F (36.4 C) 98.4 F (36.9 C) 98 F (36.7 C)  TempSrc: Oral Oral Oral Oral  SpO2: 97% 96% 91% (!) 85%  Weight:      Height:        Intake/Output Summary (Last 24 hours) at 11/10/2021 1555 Last data filed at 11/10/2021 1424 Gross per 24 hour  Intake 1408.71 ml  Output 400 ml  Net 1008.71 ml   Filed Weights   11/08/21 1050  Weight: (!) 151.5 kg    Examination:   Constitutional: NAD, AAOx3 HEENT: conjunctivae and lids normal, EOMI CV: No cyanosis.   RESP: normal respiratory effort, on RA SKIN: warm, dry Neuro: II - XII grossly intact.   Psych: Normal mood and affect.  Appropriate judgement and reason   Data Reviewed: I have personally reviewed following labs and imaging studies  CBC: Recent Labs  Lab 11/08/21 1111 11/08/21 1228 11/09/21 0501 11/10/21 0448  WBC 138.4* 133.7* 122.4* 106.5*  NEUTROABS  --  74.0*  --   --   HGB  12.1 11.4* 11.3* 10.2*  HCT 39.5 37.6 37.5 34.3*  MCV 87.2 88.1 88.4 87.5  PLT 190 176 169 784   Basic Metabolic Panel: Recent Labs  Lab 11/08/21 1111 11/08/21 1744 11/09/21 0501 11/10/21 0448  NA 137  --  139 138  K 3.4*  --  3.6 4.0  CL 105  --  106 106  CO2 24  --  28 26  GLUCOSE 137*  --  110* 104*  BUN 10  --  9 10  CREATININE 0.76  --  1.12* 1.34*  CALCIUM 9.0  --  8.5* 8.4*  MG 2.2  --   --  2.2  PHOS  --  4.5  --   --    GFR: Estimated Creatinine Clearance: 76.3 mL/min (A) (by C-G formula  based on SCr of 1.34 mg/dL (H)). Liver Function Tests: Recent Labs  Lab 11/08/21 1111  AST 23  ALT 15  ALKPHOS 76  BILITOT 0.8  PROT 8.5*  ALBUMIN 4.0   Recent Labs  Lab 11/08/21 1111  LIPASE 28   No results for input(s): AMMONIA in the last 168 hours. Coagulation Profile: Recent Labs  Lab 11/08/21 1744  INR 1.2   Cardiac Enzymes: No results for input(s): CKTOTAL, CKMB, CKMBINDEX, TROPONINI in the last 168 hours. BNP (last 3 results) No results for input(s): PROBNP in the last 8760 hours. HbA1C: No results for input(s): HGBA1C in the last 72 hours. CBG: No results for input(s): GLUCAP in the last 168 hours. Lipid Profile: No results for input(s): CHOL, HDL, LDLCALC, TRIG, CHOLHDL, LDLDIRECT in the last 72 hours. Thyroid Function Tests: Recent Labs    11/08/21 1744  TSH 1.152   Anemia Panel: No results for input(s): VITAMINB12, FOLATE, FERRITIN, TIBC, IRON, RETICCTPCT in the last 72 hours. Sepsis Labs: Recent Labs  Lab 11/08/21 1136 11/08/21 1744  LATICACIDVEN 1.2 1.0    Recent Results (from the past 240 hour(s))  Urine Culture     Status: Abnormal   Collection Time: 11/08/21 12:28 PM   Specimen: Urine, Clean Catch  Result Value Ref Range Status   Specimen Description   Final    URINE, CLEAN CATCH Performed at [facility], [address] [place], [place] [address]    Special Requests   Final    NONE Performed at [facility], [address] [place], [place] [address]    Culture (A)  Final    50,000 COLONIES/mL LACTOBACILLUS SPECIES Standardized susceptibility testing for this organism is not available. Performed at [facility], [place] [address] [place], [place] [address]    Report Status 11/10/2021 FINAL  Final  Resp Panel by RT-PCR (Flu A&B, Covid) Nasopharyngeal Swab     Status: None   Collection Time: 11/08/21  1:16 PM   Specimen: Nasopharyngeal Swab; Nasopharyngeal(NP) swabs in vial transport medium  Result Value  Ref Range Status   SARS Coronavirus 2 by RT PCR NEGATIVE NEGATIVE Final    Comment: (NOTE) SARS-CoV-2 target nucleic acids are NOT DETECTED.  The SARS-CoV-2 RNA is generally detectable in upper respiratory specimens during the acute phase of infection. The lowest concentration of SARS-CoV-2 viral copies this assay can detect is 138 copies/mL. A negative result does not preclude SARS-Cov-2 infection and should not be used as the sole basis for treatment or other patient management decisions. A negative result may occur with  improper specimen collection/handling, submission of specimen other than nasopharyngeal swab, presence of viral mutation(s) within the areas targeted by  this assay, and inadequate number of viral copies(<138 copies/mL). A negative result must be combined with clinical observations, patient history, and epidemiological information. The expected result is Negative.  Fact Sheet for Patients:  EntrepreneurPulse.com.au  Fact Sheet for Healthcare Providers:  IncredibleEmployment.be  This test is no t yet approved or cleared by the [place] FDA and  has been authorized for detection and/or diagnosis of SARS-CoV-2 by FDA under an Emergency Use Authorization (EUA). This EUA will remain  in effect (meaning this test can be used) for the duration of the COVID-19 declaration under Section 564(b)(1) of the Act, 21 U.S.C.section 360bbb-3(b)(1), unless the authorization is terminated  or revoked sooner.       Influenza A by PCR NEGATIVE NEGATIVE Final   Influenza B by PCR NEGATIVE NEGATIVE Final    Comment: (NOTE) The Xpert Xpress SARS-CoV-2/FLU/RSV plus assay is intended as an aid in the diagnosis of influenza from Nasopharyngeal swab specimens and should not be used as a sole basis for treatment. Nasal washings and aspirates are unacceptable for Xpert Xpress SARS-CoV-2/FLU/RSV testing.  Fact Sheet for  Patients: EntrepreneurPulse.com.au  Fact Sheet for Healthcare Providers: IncredibleEmployment.be  This test is not yet approved or cleared by the [place] FDA and has been authorized for detection and/or diagnosis of SARS-CoV-2 by FDA under an Emergency Use Authorization (EUA). This EUA will remain in effect (meaning this test can be used) for the duration of the COVID-19 declaration under Section 564(b)(1) of the Act, 21 U.S.C. section 360bbb-3(b)(1), unless the authorization is terminated or revoked.  Performed at [facility], [address] [place], [place] [address]   Blood culture (routine x 2)     Status: None (Preliminary result)   Collection Time: 11/08/21  5:46 PM   Specimen: BLOOD  Result Value Ref Range Status   Specimen Description BLOOD LEFT ANTECUBITAL  Final   Special Requests   Final    BOTTLES DRAWN AEROBIC AND ANAEROBIC Blood Culture adequate volume   Culture   Final    NO GROWTH 2 DAYS Performed at [facility], [address], [place], [place] [address]    Report Status PENDING  Incomplete  Blood culture (routine x 2)     Status: None (Preliminary result)   Collection Time: 11/08/21  7:29 PM   Specimen: BLOOD  Result Value Ref Range Status   Specimen Description BLOOD LEFT ANTECUBITAL  Final   Special Requests   Final    BOTTLES DRAWN AEROBIC AND ANAEROBIC Blood Culture adequate volume   Culture   Final    NO GROWTH 2 DAYS Performed at [facility], [address] [place], [place] [address]    Report Status PENDING  Incomplete      Radiology Studies: DG Chest Port 1 View  Result Date: 11/08/2021 CLINICAL DATA:  Leukocytosis.  Low back and left flank pain. EXAM: PORTABLE CHEST 1 VIEW COMPARISON:  None. FINDINGS: The heart size and mediastinal contours are within normal limits. Low lung volumes without evidence of focal consolidation or pleural effusion. The visualized skeletal  structures are unremarkable. IMPRESSION: No active disease. Electronically Signed   By: Keane Police D.O.   On: 11/08/2021 17:22     Scheduled Meds:  allopurinol  300 mg Oral Daily   enoxaparin (LOVENOX) injection  0.5 mg/kg Subcutaneous Q24H   hydroxyurea  1,000 mg Oral Daily   polyethylene glycol  17 g Oral BID   progesterone  100 mg Oral QHS   Continuous Infusions:  cefTRIAXone (ROCEPHIN)  IV Stopped (11/09/21 1555)   lactated ringers 75 mL/hr at 11/10/21 0958   promethazine (PHENERGAN) injection (IM or IVPB)       LOS: 1 day     Carol Bi, MD [facility] If 7PM-7AM, please contact night-coverage 11/10/2021, 3:55 PM

## 2021-11-11 LAB — TROPONIN I (HIGH SENSITIVITY): Troponin I (High Sensitivity): 32 ng/L — ABNORMAL HIGH (ref <18)

## 2021-11-11 LAB — CBC
HCT: 34 % — ABNORMAL LOW (ref 36.0–46.0)
Hemoglobin: 10.2 g/dL — ABNORMAL LOW (ref 12.0–15.0)
MCH: 26.4 pg (ref 26.0–34.0)
MCHC: 30 g/dL (ref 30.0–36.0)
MCV: 88.1 fL (ref 80.0–100.0)
Platelets: 166 10*3/uL (ref 150–400)
RBC: 3.86 MIL/uL — ABNORMAL LOW (ref 3.87–5.11)
RDW: 20.2 % — ABNORMAL HIGH (ref 11.5–15.5)
WBC: 101.5 10*3/uL (ref 4.0–10.5)
nRBC: 0.7 % — ABNORMAL HIGH (ref 0.0–0.2)

## 2021-11-11 LAB — BASIC METABOLIC PANEL
Anion gap: 6 (ref 5–15)
BUN: 8 mg/dL (ref 6–20)
CO2: 28 mmol/L (ref 22–32)
Calcium: 8.5 mg/dL — ABNORMAL LOW (ref 8.9–10.3)
Chloride: 100 mmol/L (ref 98–111)
Creatinine, Ser: 1.3 mg/dL — ABNORMAL HIGH (ref 0.44–1.00)
GFR, Estimated: 50 mL/min — ABNORMAL LOW (ref >60)
Glucose, Bld: 125 mg/dL — ABNORMAL HIGH (ref 70–99)
Potassium: 3.8 mmol/L (ref 3.5–5.1)
Sodium: 134 mmol/L — ABNORMAL LOW (ref 135–145)

## 2021-11-11 LAB — PHOSPHORUS: Phosphorus: 3.7 mg/dL (ref 2.5–4.6)

## 2021-11-11 LAB — URIC ACID: Uric Acid, Serum: 5.5 mg/dL (ref 2.5–7.1)

## 2021-11-11 LAB — LACTATE DEHYDROGENASE: LDH: 427 U/L — ABNORMAL HIGH (ref 98–192)

## 2021-11-11 LAB — MAGNESIUM: Magnesium: 2.3 mg/dL (ref 1.7–2.4)

## 2021-11-11 MED ORDER — CEPHALEXIN 500 MG PO CAPS: 500.0000 mg | ORAL_CAPSULE | Freq: Four times a day (QID) | ORAL | 0 refills | Status: AC

## 2021-11-11 MED ORDER — HYDROXYUREA 500 MG PO CAPS: 1000.0000 mg | ORAL_CAPSULE | Freq: Every day | ORAL | 0 refills | Status: AC

## 2021-11-11 MED ORDER — HYDROMORPHONE HCL 4 MG PO TABS: 4.0000 mg | ORAL_TABLET | ORAL | 0 refills | Status: AC | PRN

## 2021-11-11 MED ORDER — FLUCONAZOLE 100 MG PO TABS: 100.0000 mg | ORAL_TABLET | Freq: Every day | ORAL | 0 refills | Status: AC

## 2021-11-11 MED ORDER — ALLOPURINOL 300 MG PO TABS: 300.0000 mg | ORAL_TABLET | Freq: Every day | ORAL | 0 refills | Status: DC

## 2021-11-11 MED ORDER — TAMSULOSIN HCL 0.4 MG PO CAPS: 0.4000 mg | ORAL_CAPSULE | Freq: Every day | ORAL | 0 refills | Status: DC

## 2021-11-11 MED ORDER — HYDROMORPHONE HCL 2 MG PO TABS
4.0000 mg | ORAL_TABLET | ORAL | Status: DC | PRN
  Administered 2021-11-11 (×2): 4 mg via ORAL
  Filled 2021-11-11 (×2): qty 2

## 2021-11-11 MED ORDER — TAMSULOSIN HCL 0.4 MG PO CAPS
0.4000 mg | ORAL_CAPSULE | Freq: Every day | ORAL | Status: DC
  Administered 2021-11-11: 11:00:00 0.4 mg via ORAL
  Filled 2021-11-11: qty 1

## 2021-11-11 MED ORDER — CEPHALEXIN 500 MG PO CAPS
500.0000 mg | ORAL_CAPSULE | Freq: Four times a day (QID) | ORAL | Status: DC
  Administered 2021-11-11: 11:00:00 500 mg via ORAL
  Filled 2021-11-11: qty 1

## 2021-11-11 MED ORDER — MORPHINE SULFATE 15 MG PO TABS: 15.0000 mg | ORAL_TABLET | Freq: Four times a day (QID) | ORAL | 0 refills | Status: DC | PRN

## 2021-11-11 MED ORDER — PANTOPRAZOLE SODIUM 40 MG PO TBEC
40.0000 mg | DELAYED_RELEASE_TABLET | Freq: Every day | ORAL | Status: DC
  Administered 2021-11-11: 40 mg via ORAL
  Filled 2021-11-11: qty 1

## 2021-11-11 NOTE — Progress Notes (Signed)
Pt d/c to home via family. IV removed intact. Education completed. All belongings sent with pt. All questions.answered.

## 2021-11-11 NOTE — Discharge Summary (Signed)
Physician Discharge Summary   Carol Ortega  female DOB: [birth year]  XTK:[id number]  PCP: Berkley Harvey, NP  Admit date: 11/08/2021 Discharge date: 11/11/2021  Admitted From: home Disposition:  home CODE STATUS: Full code  Discharge Instructions     Discharge instructions   Complete by: As directed    Your kidney function is stable, so you can go home and follow up with urology next Tue for stone removal.  Also please finish taking antibiotic Keflex for 10 days at home as directed.  Oncology Dr. Janese Banks wants you to take hydrea until Saturday, and keep taking allopurinol, until you follow up with her in her outpatient clinic.   Dr. Enzo Bi [facility] Course:  For full details, please see H&P, progress notes, consult notes and ancillary notes.  Briefly,  Carol Ortega is a 50 y.o. female with medical history significant for hypertension, anxiety, depression, morbid obesity, presumed obstructive sleep apnea, who presented to the emergency department for chief concerns of left flank pain.     # Left flank pain secondary to calculus in the left ureteropelvic junction/proximal left ureter --urology consulted --plan for outpatient stone treatment next Tue 11/29 with Dr. Bernardo Heater --discharged on oral dialudid for pain management.   Possible UTI  --no fever.  Urine cx pos for 50,000 COLONIES/mL LACTOBACILLUS SPECIES --pt received 3 days of ceftriaxone and was discharged on 10 days of Keflex.   AKI likely 2/2 left moderate hydronephrosis --Cr baseline around 0.7, Cr trended up and stabilized around 1.3.  Expect Cr to return to normal after kidney stone is removed and hydro resolved.   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 Janese Banks  --started hydrea on 11/23, per oncology, to be taken until 11/26.   --discharged on allopurinol for  tumor lysis syndrome prophylaxis. --pt will follow up with Dr. Janese Banks as outpatient.   # Morbid obesity, BMI 53   # Anxiety/depression - Patient states that she is no longer taking Abilify, citalopram, trazodone - Continue outpatient follow-up with PCP and/or behavioral   # History of GERD -patient states she was no longer taking omeprazole, however, given her complaints of epigastric discomfort, PPI resumed at discharge.   # History of chronic pain -patient states she is no longer taking Flexeril  D/c'ed meds are what pt wasn't taking PTA.   Discharge Diagnoses:  Principal Problem:   Leukocytosis Active Problems:   Hyperparathyroidism ([place])   Kidney stone   Morbid obesity due to excess calories (HCC)   Depression, recurrent (HCC)   GAD (generalized anxiety disorder)   Insomnia   Adult body mass index 50.0-59.9 ([place])   Anxiety state   Essential hypertension   Hyperlipidemia   Obstructive sleep apnea   S/P laparoscopic cholecystectomy   Brain tumor ([place])   Left flank pain   30 Day Unplanned Readmission Risk Score    Flowsheet Row ED to Hosp-Admission (Current) from 11/08/2021 in [place] (1C)  30 Day Unplanned Readmission Risk Score (%) 14.93 Filed at 11/11/2021 0801       This score is the patient's risk of an unplanned readmission within 30 days of being discharged (0 -100%). The score is based on dignosis, age, lab data, medications, orders, and past utilization.   Low:  0-14.9   Medium: 15-21.9   High: 22-29.9   Extreme: 30 and  above         Discharge Instructions:  Allergies as of 11/11/2021       Reactions   Bee Venom Anaphylaxis   Contrast Media [iodinated Diagnostic Agents] Anaphylaxis   Eggs Or Egg-derived Products Anaphylaxis   Other Anaphylaxis   Surgical dye.. Pt states ok to take if previously taken benadryl or prednisone   Penicillins Hives, Other (See Comments)   Has patient had a PCN reaction causing  immediate rash, facial/tongue/throat swelling, SOB or lightheadedness with hypotension: No Has patient had a PCN reaction causing severe rash involving mucus membranes or skin necrosis: No Has patient had a PCN reaction that required hospitalization No Has patient had a PCN reaction occurring within the last 10 years: No If all of the above answers are "NO", then may proceed with Cephalosporin use.   Wellbutrin [bupropion] Other (See Comments)   irritiability   Latex Rash   Oxycodone-acetaminophen Itching        Medication List     STOP taking these medications    acetaminophen 500 MG tablet Commonly known as: TYLENOL   ARIPiprazole 10 MG tablet Commonly known as: Abilify   citalopram 10 MG tablet Commonly known as: CELEXA   cyclobenzaprine 5 MG tablet Commonly known as: FLEXERIL   diclofenac sodium 1 % Gel Commonly known as: VOLTAREN   hydrOXYzine 25 MG capsule Commonly known as: Vistaril   ibuprofen 400 MG tablet Commonly known as: ADVIL   naproxen 500 MG tablet Commonly known as: Naprosyn   ondansetron 4 MG disintegrating tablet Commonly known as: Zofran ODT   orphenadrine 100 MG tablet Commonly known as: NORFLEX   oxyCODONE-acetaminophen 5-325 MG tablet Commonly known as: PERCOCET/ROXICET   oxyCODONE-acetaminophen 7.5-325 MG tablet Commonly known as: Percocet   traZODone 150 MG tablet Commonly known as: DESYREL   Vitamin D (Ergocalciferol) 1.25 MG (50000 UNIT) Caps capsule Commonly known as: DRISDOL       TAKE these medications    albuterol (2.5 MG/3ML) 0.083% nebulizer solution Commonly known as: PROVENTIL Take 3 mLs (2.5 mg total) by nebulization every 6 (six) hours as needed for wheezing or shortness of breath.   albuterol 108 (90 Base) MCG/ACT inhaler Commonly known as: VENTOLIN HFA Inhale 2 puffs into the lungs every 6 (six) hours as needed for wheezing or shortness of breath.   allopurinol 300 MG tablet Commonly known as:  ZYLOPRIM Take 1 tablet (300 mg total) by mouth daily.   cephALEXin 500 MG capsule Commonly known as: KEFLEX Take 1 capsule (500 mg total) by mouth 4 (four) times daily for 10 days.   diphenhydrAMINE 25 MG tablet Commonly known as: BENADRYL Take 25 mg by mouth daily as needed for allergies.   HYDROmorphone 4 MG tablet Commonly known as: DILAUDID Take 1 tablet (4 mg total) by mouth every 4 (four) hours as needed for up to 5 days for severe pain or moderate pain.   hydroxyurea 500 MG capsule Commonly known as: HYDREA Take 2 capsules (1,000 mg total) by mouth daily for 1 day. May take with food to minimize GI side effects. Start taking on: November 12, 2021   omeprazole 20 MG capsule Commonly known as: PRILOSEC Take 1 capsule (20 mg total) by mouth daily.   progesterone 100 MG capsule Commonly known as: PROMETRIUM Take 100 mg by mouth at bedtime.   propranolol 10 MG tablet Commonly known as: INDERAL TAKE 1 TABLET BY MOUTH THREE TIMES DAILY AS NEEDED FOR SEVERE ANXIETY SYMPTOMS   tamsulosin 0.4  MG Caps capsule Commonly known as: FLOMAX Take 1 capsule (0.4 mg total) by mouth daily.         Follow-up Information     Berkley Harvey, NP Follow up in 1 week(s).   Specialty: Nurse Practitioner Contact information: [place] [address] [phone]         Sindy Guadeloupe, MD Follow up.   Specialty: Oncology Contact information: [place] [address] [phone]                 Allergies  Allergen Reactions   Bee Venom Anaphylaxis   Contrast Media [Iodinated Diagnostic Agents] Anaphylaxis   Eggs Or Egg-Derived Products Anaphylaxis   Other Anaphylaxis    Surgical dye.. Pt states ok to take if previously taken benadryl or prednisone   Penicillins Hives and Other (See Comments)    Has patient had a PCN reaction causing immediate rash, facial/tongue/throat swelling, SOB or lightheadedness with hypotension:  No Has patient had a PCN reaction causing severe rash involving mucus membranes or skin necrosis: No Has patient had a PCN reaction that required hospitalization No Has patient had a PCN reaction occurring within the last 10 years: No If all of the above answers are "NO", then may proceed with Cephalosporin use.   Wellbutrin [Bupropion] Other (See Comments)    irritiability   Latex Rash   Oxycodone-Acetaminophen Itching     The results of significant diagnostics from this hospitalization (including imaging, microbiology, ancillary and laboratory) are listed below for reference.   Consultations:   Procedures/Studies: CT Abdomen Pelvis Wo Contrast  Result Date: 11/08/2021 CLINICAL DATA:  Left flank pain, kidney stone suspected. EXAM: CT ABDOMEN AND PELVIS WITHOUT CONTRAST TECHNIQUE: Multidetector CT imaging of the abdomen and pelvis was performed following the standard protocol without IV contrast. COMPARISON:  CT examination dated March 16, 2020 FINDINGS: Lower chest: No acute abnormality. Hepatobiliary: No focal liver abnormality is seen. Status post cholecystectomy. No biliary dilatation. Pancreas: Unremarkable. No pancreatic ductal dilatation or surrounding inflammatory changes. Spleen: Normal in size without focal abnormality. Adrenals/Urinary Tract: Adrenal glands are unremarkable. Moderate left hydronephrosis secondary to a 1.1 x 0.9 x 1.3 cm calculus at the left ureteropelvic junction/proximal left ureter. Mild left perinephric fat stranding. There is another punctate nonobstructing calculus in the upper pole of the left kidney. Simple cysts in the lower pole of the right kidney. No right hydroureteronephrosis. Right kidney and ureter bladder is unremarkable. Stomach/Bowel: Stomach is within normal limits. Appendix appears normal. No evidence of bowel wall thickening, distention, or inflammatory changes. Vascular/Lymphatic: No significant vascular findings are present. No enlarged  abdominal or pelvic lymph nodes. Reproductive: Uterus and bilateral adnexa are unremarkable. Other: No abdominal wall hernia or abnormality. No abdominopelvic ascites. Musculoskeletal: No acute or significant osseous findings. Mild multilevel degenerative disc disease of the lumbar spine. IMPRESSION: Moderate left hydronephrosis secondary to a 1.1 x 0.9 x 1.3 cm calculus at the left ureteropelvic junction/proximal left ureter. Electronically Signed   By: Keane Police D.O.   On: 11/08/2021 11:52   DG Chest Port 1 View  Result Date: 11/08/2021 CLINICAL DATA:  Leukocytosis.  Low back and left flank pain. EXAM: PORTABLE CHEST 1 VIEW COMPARISON:  None. FINDINGS: The heart size and mediastinal contours are within normal limits. Low lung volumes without evidence of focal consolidation or pleural effusion. The visualized skeletal structures are unremarkable. IMPRESSION: No active disease. Electronically Signed   By: Judye Bos.O.  On: 11/08/2021 17:22      Labs: BNP (last 3 results) No results for input(s): BNP in the last 8760 hours. Basic Metabolic Panel: Recent Labs  Lab 11/08/21 1111 11/08/21 1744 11/09/21 0501 11/10/21 0448 11/11/21 0421  NA 137  --  139 138 134*  K 3.4*  --  3.6 4.0 3.8  CL 105  --  106 106 100  CO2 24  --  28 26 28   GLUCOSE 137*  --  110* 104* 125*  BUN 10  --  9 10 8   CREATININE 0.76  --  1.12* 1.34* 1.30*  CALCIUM 9.0  --  8.5* 8.4* 8.5*  MG 2.2  --   --  2.2 2.3  PHOS  --  4.5  --   --  3.7   Liver Function Tests: Recent Labs  Lab 11/08/21 1111  AST 23  ALT 15  ALKPHOS 76  BILITOT 0.8  PROT 8.5*  ALBUMIN 4.0   Recent Labs  Lab 11/08/21 1111  LIPASE 28   No results for input(s): AMMONIA in the last 168 hours. CBC: Recent Labs  Lab 11/08/21 1111 11/08/21 1228 11/09/21 0501 11/10/21 0448 11/11/21 0421  WBC 138.4* 133.7* 122.4* 106.5* 101.5*  NEUTROABS  --  74.0*  --   --   --   HGB 12.1 11.4* 11.3* 10.2* 10.2*  HCT 39.5 37.6 37.5 34.3*  34.0*  MCV 87.2 88.1 88.4 87.5 88.1  PLT 190 176 169 167 166   Cardiac Enzymes: No results for input(s): CKTOTAL, CKMB, CKMBINDEX, TROPONINI in the last 168 hours. BNP: Invalid input(s): POCBNP CBG: No results for input(s): GLUCAP in the last 168 hours. D-Dimer No results for input(s): DDIMER in the last 72 hours. Hgb A1c No results for input(s): HGBA1C in the last 72 hours. Lipid Profile No results for input(s): CHOL, HDL, LDLCALC, TRIG, CHOLHDL, LDLDIRECT in the last 72 hours. Thyroid function studies Recent Labs    11/08/21 1744  TSH 1.152   Anemia work up No results for input(s): VITAMINB12, FOLATE, FERRITIN, TIBC, IRON, RETICCTPCT in the last 72 hours. Urinalysis    Component Value Date/Time   COLORURINE YELLOW (A) 11/08/2021 1228   APPEARANCEUR CLOUDY (A) 11/08/2021 1228   APPEARANCEUR Hazy (A) 09/26/2018 1605   LABSPEC 1.021 11/08/2021 1228   PHURINE 5.0 11/08/2021 1228   GLUCOSEU NEGATIVE 11/08/2021 1228   HGBUR MODERATE (A) 11/08/2021 1228   BILIRUBINUR NEGATIVE 11/08/2021 1228   BILIRUBINUR Negative 09/26/2018 [place] 11/08/2021 1228   PROTEINUR 30 (A) 11/08/2021 1228   UROBILINOGEN 1.0 10/23/2015 0556   NITRITE NEGATIVE 11/08/2021 1228   LEUKOCYTESUR NEGATIVE 11/08/2021 1228   Sepsis Labs Invalid input(s): PROCALCITONIN,  WBC,  LACTICIDVEN Microbiology Recent Results (from the past 240 hour(s))  Urine Culture     Status: Abnormal   Collection Time: 11/08/21 12:28 PM   Specimen: Urine, Clean Catch  Result Value Ref Range Status   Specimen Description   Final    URINE, CLEAN CATCH Performed at [facility], [address] [place], [place] [address]    Special Requests   Final    NONE Performed at [facility], [address] [place], [place] [address]    Culture (A)  Final    50,000 COLONIES/mL LACTOBACILLUS SPECIES Standardized susceptibility testing for this organism is not available. Performed at [facility], [place] [address], [place], [place] [address]    Report Status 11/10/2021 FINAL  Final  Resp Panel  by RT-PCR (Flu A&B, Covid) Nasopharyngeal Swab     Status: None   Collection Time: 11/08/21  1:16 PM   Specimen: Nasopharyngeal Swab; Nasopharyngeal(NP) swabs in vial transport medium  Result Value Ref Range Status   SARS Coronavirus 2 by RT PCR NEGATIVE NEGATIVE Final    Comment: (NOTE) SARS-CoV-2 target nucleic acids are NOT DETECTED.  The SARS-CoV-2 RNA is generally detectable in upper respiratory specimens during the acute phase of infection. The lowest concentration of SARS-CoV-2 viral copies this assay can detect is 138 copies/mL. A negative result does not preclude SARS-Cov-2 infection and should not be used as the sole basis for treatment or other patient management decisions. A negative result may occur with  improper specimen collection/handling, submission of specimen other than nasopharyngeal swab, presence of viral mutation(s) within the areas targeted by this assay, and inadequate number of viral copies(<138 copies/mL). A negative result must be combined with clinical observations, patient history, and epidemiological information. The expected result is Negative.  Fact Sheet for Patients:  EntrepreneurPulse.com.au  Fact Sheet for Healthcare Providers:  IncredibleEmployment.be  This test is no t yet approved or cleared by the [place] FDA and  has been authorized for detection and/or diagnosis of SARS-CoV-2 by FDA under an Emergency Use Authorization (EUA). This EUA will remain  in effect (meaning this test can be used) for the duration of the COVID-19 declaration under Section 564(b)(1) of the Act, 21 U.S.C.section 360bbb-3(b)(1), unless the authorization is terminated  or revoked sooner.       Influenza A by PCR NEGATIVE NEGATIVE Final   Influenza B by PCR NEGATIVE NEGATIVE Final    Comment: (NOTE) The Xpert  Xpress SARS-CoV-2/FLU/RSV plus assay is intended as an aid in the diagnosis of influenza from Nasopharyngeal swab specimens and should not be used as a sole basis for treatment. Nasal washings and aspirates are unacceptable for Xpert Xpress SARS-CoV-2/FLU/RSV testing.  Fact Sheet for Patients: EntrepreneurPulse.com.au  Fact Sheet for Healthcare Providers: IncredibleEmployment.be  This test is not yet approved or cleared by the [place] FDA and has been authorized for detection and/or diagnosis of SARS-CoV-2 by FDA under an Emergency Use Authorization (EUA). This EUA will remain in effect (meaning this test can be used) for the duration of the COVID-19 declaration under Section 564(b)(1) of the Act, 21 U.S.C. section 360bbb-3(b)(1), unless the authorization is terminated or revoked.  Performed at [facility], [address] [place], [place] [address]   Blood culture (routine x 2)     Status: None (Preliminary result)   Collection Time: 11/08/21  5:46 PM   Specimen: BLOOD  Result Value Ref Range Status   Specimen Description BLOOD LEFT ANTECUBITAL  Final   Special Requests   Final    BOTTLES DRAWN AEROBIC AND ANAEROBIC Blood Culture adequate volume   Culture   Final    NO GROWTH 3 DAYS Performed at [facility], [address] [place], [place] [address]    Report Status PENDING  Incomplete  Blood culture (routine x 2)     Status: None (Preliminary result)   Collection Time: 11/08/21  7:29 PM   Specimen: BLOOD  Result Value Ref Range Status   Specimen Description BLOOD LEFT ANTECUBITAL  Final   Special Requests   Final    BOTTLES DRAWN AEROBIC AND ANAEROBIC Blood Culture adequate volume   Culture   Final    NO GROWTH 3 DAYS Performed at [facility], [address], [place], [place] [address]  Report Status PENDING  Incomplete     Total time spend on discharging this patient, including the last  patient exam, discussing the hospital stay, instructions for ongoing care as it relates to all pertinent caregivers, as well as preparing the medical discharge records, prescriptions, and/or referrals as applicable, is 45 minutes.    Enzo Bi, MD  [facility] 11/11/2021, 9:40 AM

## 2021-11-13 LAB — CULTURE, BLOOD (ROUTINE X 2)
Culture: NO GROWTH
Special Requests: ADEQUATE

## 2021-11-13 LAB — GLUCOSE 6 PHOSPHATE DEHYDROGENASE
G6PDH: 26.6 U/g{Hb} — ABNORMAL HIGH (ref 4.7–14.6)
Hemoglobin: 11.9 g/dL (ref 11.1–15.9)

## 2021-11-14 LAB — COMP PANEL: LEUKEMIA/LYMPHOMA
CLINICAL INFO: 23
Immunophenotypic Profile: 2

## 2021-11-14 LAB — BCR-ABL1 FISH
Cells Analyzed: 100
Cells Counted: 100

## 2021-11-15 ENCOUNTER — Ambulatory Visit
Admission: RE | Admit: 2021-11-15 | Discharge: 2021-11-15 | Disposition: A | Discharge status: Home / Self Care | Payer: Medicaid Other | Source: Ambulatory Visit | Attending: Urology | Admitting: Urology

## 2021-11-15 ENCOUNTER — Encounter: Payer: Self-pay | Admitting: Urology

## 2021-11-15 ENCOUNTER — Other Ambulatory Visit (HOSPITAL_COMMUNITY): Payer: Self-pay

## 2021-11-15 ENCOUNTER — Ambulatory Visit: Payer: Medicaid Other | Admitting: Anesthesiology

## 2021-11-15 ENCOUNTER — Ambulatory Visit: Payer: Medicaid Other

## 2021-11-15 ENCOUNTER — Other Ambulatory Visit: Payer: Self-pay

## 2021-11-15 ENCOUNTER — Encounter
Admission: RE | Disposition: A | Discharge status: Home / Self Care | Payer: Self-pay | Source: Ambulatory Visit | Attending: Urology

## 2021-11-15 DIAGNOSIS — Z87442 Personal history of urinary calculi: Secondary | ICD-10-CM | POA: Insufficient documentation

## 2021-11-15 DIAGNOSIS — J45909 Unspecified asthma, uncomplicated: Secondary | ICD-10-CM | POA: Diagnosis not present

## 2021-11-15 DIAGNOSIS — N201 Calculus of ureter: Secondary | ICD-10-CM | POA: Insufficient documentation

## 2021-11-15 DIAGNOSIS — I10 Essential (primary) hypertension: Secondary | ICD-10-CM | POA: Insufficient documentation

## 2021-11-15 DIAGNOSIS — D759 Disease of blood and blood-forming organs, unspecified: Secondary | ICD-10-CM | POA: Insufficient documentation

## 2021-11-15 DIAGNOSIS — D649 Anemia, unspecified: Secondary | ICD-10-CM | POA: Diagnosis not present

## 2021-11-15 DIAGNOSIS — K219 Gastro-esophageal reflux disease without esophagitis: Secondary | ICD-10-CM | POA: Diagnosis not present

## 2021-11-15 DIAGNOSIS — N200 Calculus of kidney: Secondary | ICD-10-CM

## 2021-11-15 DIAGNOSIS — N23 Unspecified renal colic: Secondary | ICD-10-CM | POA: Diagnosis not present

## 2021-11-15 DIAGNOSIS — F419 Anxiety disorder, unspecified: Secondary | ICD-10-CM | POA: Diagnosis not present

## 2021-11-15 HISTORY — PX: CYSTOSCOPY/URETEROSCOPY/HOLMIUM LASER/STENT PLACEMENT: SHX6546

## 2021-11-15 LAB — CULTURE, BLOOD (ROUTINE X 2)
Culture: NO GROWTH
Special Requests: ADEQUATE

## 2021-11-15 LAB — POCT PREGNANCY, URINE: Preg Test, Ur: NEGATIVE

## 2021-11-15 SURGERY — CYSTOSCOPY/URETEROSCOPY/HOLMIUM LASER/STENT PLACEMENT
Anesthesia: General | Laterality: Left

## 2021-11-15 MED ORDER — CHLORHEXIDINE GLUCONATE 0.12 % MT SOLN
15.0000 mL | Freq: Once | OROMUCOSAL | Status: AC
  Administered 2021-11-15: 15 mL via OROMUCOSAL

## 2021-11-15 MED ORDER — ONDANSETRON HCL 4 MG/2ML IJ SOLN
INTRAMUSCULAR | Status: DC | PRN
  Administered 2021-11-15: 4 mg via INTRAVENOUS

## 2021-11-15 MED ORDER — OXYBUTYNIN CHLORIDE 5 MG PO TABS: ORAL_TABLET | ORAL | 0 refills | Status: DC

## 2021-11-15 MED ORDER — LIDOCAINE HCL (CARDIAC) PF 100 MG/5ML IV SOSY
PREFILLED_SYRINGE | INTRAVENOUS | Status: DC | PRN
  Administered 2021-11-15: 100 mg via INTRAVENOUS

## 2021-11-15 MED ORDER — CEFAZOLIN SODIUM-DEXTROSE 2-4 GM/100ML-% IV SOLN
2.0000 g | INTRAVENOUS | Status: AC
  Administered 2021-11-15: 2 g via INTRAVENOUS

## 2021-11-15 MED ORDER — ROCURONIUM BROMIDE 100 MG/10ML IV SOLN
INTRAVENOUS | Status: DC | PRN
  Administered 2021-11-15: 50 mg via INTRAVENOUS
  Administered 2021-11-15: 20 mg via INTRAVENOUS
  Administered 2021-11-15: 10 mg via INTRAVENOUS
  Administered 2021-11-15: 20 mg via INTRAVENOUS

## 2021-11-15 MED ORDER — LACTATED RINGERS IV SOLN: INTRAVENOUS | Status: DC

## 2021-11-15 MED ORDER — FENTANYL CITRATE (PF) 100 MCG/2ML IJ SOLN
INTRAMUSCULAR | Status: AC
  Filled 2021-11-15: qty 2

## 2021-11-15 MED ORDER — DEXAMETHASONE SODIUM PHOSPHATE 10 MG/ML IJ SOLN
INTRAMUSCULAR | Status: DC | PRN
  Administered 2021-11-15: 10 mg via INTRAVENOUS

## 2021-11-15 MED ORDER — SUCCINYLCHOLINE CHLORIDE 200 MG/10ML IV SOSY
PREFILLED_SYRINGE | INTRAVENOUS | Status: DC | PRN
  Administered 2021-11-15: 120 mg via INTRAVENOUS

## 2021-11-15 MED ORDER — SODIUM CHLORIDE 0.9 % IR SOLN
Status: DC | PRN
  Administered 2021-11-15: 3000 mL

## 2021-11-15 MED ORDER — SUGAMMADEX SODIUM 200 MG/2ML IV SOLN
INTRAVENOUS | Status: DC | PRN
  Administered 2021-11-15: 300 mg via INTRAVENOUS

## 2021-11-15 MED ORDER — MIDAZOLAM HCL 2 MG/2ML IJ SOLN
INTRAMUSCULAR | Status: AC
  Filled 2021-11-15: qty 2

## 2021-11-15 MED ORDER — ORAL CARE MOUTH RINSE: 15.0000 mL | Freq: Once | OROMUCOSAL | Status: AC

## 2021-11-15 MED ORDER — MIDAZOLAM HCL 2 MG/2ML IJ SOLN
INTRAMUSCULAR | Status: DC | PRN
  Administered 2021-11-15: 2 mg via INTRAVENOUS

## 2021-11-15 MED ORDER — PROMETHAZINE HCL 25 MG/ML IJ SOLN: 6.2500 mg | INTRAMUSCULAR | Status: DC | PRN

## 2021-11-15 MED ORDER — FENTANYL CITRATE (PF) 100 MCG/2ML IJ SOLN
INTRAMUSCULAR | Status: DC | PRN
  Administered 2021-11-15 (×2): 50 ug via INTRAVENOUS

## 2021-11-15 MED ORDER — FENTANYL CITRATE (PF) 100 MCG/2ML IJ SOLN
25.0000 ug | INTRAMUSCULAR | Status: DC | PRN
  Administered 2021-11-15: 25 ug via INTRAVENOUS

## 2021-11-15 MED ORDER — IPRATROPIUM-ALBUTEROL 0.5-2.5 (3) MG/3ML IN SOLN
3.0000 mL | Freq: Once | RESPIRATORY_TRACT | Status: AC
  Administered 2021-11-15: 3 mL via RESPIRATORY_TRACT

## 2021-11-15 MED ORDER — IOPAMIDOL (ISOVUE-200) INJECTION 41%
INTRAVENOUS | Status: DC | PRN
  Administered 2021-11-15: 25 mL via INTRAVENOUS

## 2021-11-15 MED ORDER — PROPOFOL 10 MG/ML IV BOLUS
INTRAVENOUS | Status: DC | PRN
  Administered 2021-11-15: 200 mg via INTRAVENOUS

## 2021-11-15 SURGICAL SUPPLY — 32 items
BAG DRAIN CYSTO-URO LG1000N (MISCELLANEOUS) ×2 IMPLANT
BASKET ZERO TIP 1.9FR (BASKET) ×2 IMPLANT
BRUSH SCRUB EZ 1% IODOPHOR (MISCELLANEOUS) ×2 IMPLANT
BSKT STON RTRVL ZERO TP 1.9FR (BASKET) ×1
CATH URET FLEX-TIP 2 LUMEN 10F (CATHETERS) IMPLANT
CATH URETL OPEN 5X70 (CATHETERS) ×2 IMPLANT
CNTNR SPEC 2.5X3XGRAD LEK (MISCELLANEOUS)
CONT SPEC 4OZ STER OR WHT (MISCELLANEOUS)
CONT SPEC 4OZ STRL OR WHT (MISCELLANEOUS)
CONTAINER SPEC 2.5X3XGRAD LEK (MISCELLANEOUS) IMPLANT
DRAPE UTILITY 15X26 TOWEL STRL (DRAPES) ×2 IMPLANT
GAUZE 4X4 16PLY ~~LOC~~+RFID DBL (SPONGE) ×4 IMPLANT
GLOVE SURG UNDER POLY LF SZ7.5 (GLOVE) ×2 IMPLANT
GOWN STRL REUS W/ TWL LRG LVL3 (GOWN DISPOSABLE) ×1 IMPLANT
GOWN STRL REUS W/ TWL XL LVL3 (GOWN DISPOSABLE) ×1 IMPLANT
GOWN STRL REUS W/TWL LRG LVL3 (GOWN DISPOSABLE) ×2
GOWN STRL REUS W/TWL XL LVL3 (GOWN DISPOSABLE) ×2
GUIDEWIRE GREEN .038 145CM (MISCELLANEOUS) ×2 IMPLANT
GUIDEWIRE STR DUAL SENSOR (WIRE) ×4 IMPLANT
INFUSOR MANOMETER BAG 3000ML (MISCELLANEOUS) ×2 IMPLANT
IV NS IRRIG 3000ML ARTHROMATIC (IV SOLUTION) ×2 IMPLANT
KIT TURNOVER CYSTO (KITS) ×2 IMPLANT
PACK CYSTO AR (MISCELLANEOUS) ×2 IMPLANT
SET CYSTO W/LG BORE CLAMP LF (SET/KITS/TRAYS/PACK) ×2 IMPLANT
SHEATH URETERAL 12FRX35CM (MISCELLANEOUS) IMPLANT
STENT URET 6FRX24 CONTOUR (STENTS) ×2 IMPLANT
STENT URET 6FRX26 CONTOUR (STENTS) IMPLANT
SURGILUBE 2OZ TUBE FLIPTOP (MISCELLANEOUS) ×2 IMPLANT
TRACTIP FLEXIVA PULSE ID 200 (Laser) ×2 IMPLANT
VALVE UROSEAL ADJ ENDO (VALVE) ×4 IMPLANT
WATER STERILE IRR 1000ML POUR (IV SOLUTION) ×2 IMPLANT
WATER STERILE IRR 500ML POUR (IV SOLUTION) ×2 IMPLANT

## 2021-11-15 NOTE — Transfer of Care (Signed)
Immediate Anesthesia Transfer of Care Note  Patient: Carol Ortega  Procedure(s) Performed: CYSTOSCOPY/URETEROSCOPY/HOLMIUM LASER/STENT PLACEMENT (Left)  Patient Location: PACU  Anesthesia Type:General  Level of Consciousness: drowsy  Airway & Oxygen Therapy: Patient Spontanous Breathing and Patient connected to face mask oxygen  Post-op Assessment: Report given to RN  Post vital signs: stable  Last Vitals:  Vitals Value Taken Time  BP 137/90 11/15/21 1735  Temp    Pulse 100 11/15/21 1737  Resp 19 11/15/21 1737  SpO2 94 % 11/15/21 1737  Vitals shown include unvalidated device data.  Last Pain:  Vitals:   11/15/21 1422  TempSrc: Temporal  PainSc: 7          Complications: No notable events documented.

## 2021-11-15 NOTE — Anesthesia Procedure Notes (Signed)
Procedure Name: Intubation Date/Time: 11/15/2021 4:03 PM Performed by: Lerry Liner, CRNA Pre-anesthesia Checklist: Patient identified, Emergency Drugs available, Suction available and Patient being monitored Patient Re-evaluated:Patient Re-evaluated prior to induction Oxygen Delivery Method: Circle system utilized Preoxygenation: Pre-oxygenation with 100% oxygen Induction Type: IV induction Ventilation: Mask ventilation without difficulty Laryngoscope Size: McGraph and 3 Grade View: Grade I Tube type: Oral Tube size: 7.0 mm Number of attempts: 1 Airway Equipment and Method: Stylet and Oral airway Placement Confirmation: ETT inserted through vocal cords under direct vision, positive ETCO2 and breath sounds checked- equal and bilateral Secured at: 22 cm Tube secured with: Tape Dental Injury: Teeth and Oropharynx as per pre-operative assessment

## 2021-11-15 NOTE — Progress Notes (Signed)
Pt Carol Ortega in the 80's on room. On ausculation pt noted to have some wheezing. Dr. Rosey Bath notified. Acknowledged. Orders received. See [facility]

## 2021-11-15 NOTE — Anesthesia Preprocedure Evaluation (Signed)
Anesthesia Evaluation  Patient identified by MRN, date of birth, ID band Patient awake    Reviewed: Allergy & Precautions, H&P , NPO status , reviewed documented beta blocker date and time   History of Anesthesia Complications (+) DIFFICULT IV STICK / SPECIAL LINE and history of anesthetic complications  Airway Mallampati: III  TM Distance: >3 FB     Dental  (+) Chipped, Dental Advidsory Given, Missing, Teeth Intact   Pulmonary neg shortness of breath, asthma , sleep apnea , neg COPD, neg recent URI, Patient abstained from smoking.,    Pulmonary exam normal  + decreased breath sounds      Cardiovascular hypertension, (-) angina(-) Past MI and (-) Cardiac Stents Normal cardiovascular exam(-) dysrhythmias (-) Valvular Problems/Murmurs     Neuro/Psych  Headaches, neg Seizures PSYCHIATRIC DISORDERS Anxiety Depression    GI/Hepatic Neg liver ROS, GERD  ,  Endo/Other  neg diabetesMorbid obesity  Renal/GU Renal disease     Musculoskeletal   Abdominal   Peds  Hematology  (+) Blood dyscrasia, anemia ,   Anesthesia Other Findings Past Medical History: No date: Anemia No date: Anxiety No date: Asthma     Comment:  seasonal 2014: Brain tumor ([place])     Comment:  tx with radiation No date: Cancer ([place]) 6962: Complication of anesthesia     Comment:  lung collapse after surgery for gallbladder No date: Depression No date: Headache     Comment:  migraines No date: Hoarseness of voice No date: Hypertension No date: Kidney stone No date: Obesity 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[place]) 2011: Pulmonary emboli ([facility]) No date: Radiation     Comment:  Brain tumor No date: UTI (urinary tract infection) No date: Vitamin D deficiency  Past Surgical History: 1999: CHOLECYSTECTOMY 04/08/2017: CYSTOSCOPY W/ URETERAL STENT PLACEMENT; Right     Comment:  Procedure: CYSTOSCOPY WITH RETROGRADE PYELOGRAM/URETERAL              STENT  PLACEMENT;  Surgeon: Rana Snare, MD;  Location:               [facility];  Service: Urology;  Laterality: Right; No date: CYSTOSCOPY W/ URETEROSCOPY 01/20/2014: CYSTOSCOPY WITH RETROGRADE PYELOGRAM, URETEROSCOPY AND  STENT PLACEMENT; Left     Comment:  Procedure: LEFT URETEROSCOPY WITH STENT PLACEMENT;                Surgeon: Irine Seal, MD;  Location: [facility];  Service:               Urology;  Laterality: Left; 04/23/2015: CYSTOSCOPY WITH RETROGRADE PYELOGRAM, URETEROSCOPY AND  STENT PLACEMENT; Bilateral     Comment:  Procedure: CYSTOSCOPY WITH BILATERAL RETROGRADE               PYELOGRAM, URETEROSCOPY AND STENT PLACEMENT;  Surgeon:               Alexis Frock, MD;  Location: [facility];  Service: Urology;              Laterality: Bilateral; 04/13/2017: CYSTOSCOPY WITH RETROGRADE PYELOGRAM, URETEROSCOPY AND  STENT PLACEMENT; Right     Comment:  Procedure: CYSTOSCOPY WITH RETROGRADE PYELOGRAM,               URETEROSCOPY AND STENT EXCHANGE;  Surgeon: Alexis Frock, MD;  Location: [facility];  Service: Urology;                Laterality:  Right; 01/12/2014: CYSTOSCOPY WITH STENT PLACEMENT; Left     Comment:  Procedure: CYSTOSCOPY WITH STENT PLACEMENT left               retrograde;  Surgeon: Irine Seal, MD;  Location: [facility];               Service: Urology;  Laterality: Left; 01/20/2014: HOLMIUM LASER APPLICATION; Left     Comment:  Procedure: HOLMIUM LASER APPLICATION;  Surgeon: Irine Seal, MD;  Location: [facility];  Service: Urology;                Laterality: Left; 04/23/2015: HOLMIUM LASER APPLICATION; Bilateral     Comment:  Procedure: HOLMIUM LASER APPLICATION;  Surgeon: Alexis Frock, MD;  Location: [facility];  Service: Urology;                Laterality: Bilateral; 04/13/2017: HOLMIUM LASER APPLICATION; Right     Comment:  Procedure: HOLMIUM LASER APPLICATION;  Surgeon: Alexis Frock, MD;  Location: [facility];  Service: Urology;                 Laterality: Right; No date: kidney stone removal No date: LITHOTRIPSY 11/13/2016: PARATHYROIDECTOMY; Right     Comment:  Procedure: RIGHT SUPERIOR PARATHYROIDECTOMY;  Surgeon:               Armandina Gemma, MD;  Location: [facility];  Service: General;                Laterality: Right; 2011: surgery for,ectopic pregnancy     Comment:   1 fallopian tube rupture 2011: TUBAL LIGATION  Reproductive/Obstetrics                             Anesthesia Physical  Anesthesia Plan  ASA: 3  Anesthesia Plan: General   Post-op Pain Management:    Induction: Intravenous  PONV Risk Score and Plan: 3 and Ondansetron and Midazolam  Airway Management Planned: Oral ETT  Additional Equipment:   Intra-op Plan:   Post-operative Plan: Extubation in OR  Informed Consent: I have reviewed the patients History and Physical, chart, labs and discussed the procedure including the risks, benefits and alternatives for the proposed anesthesia with the patient or authorized representative who has indicated his/her understanding and acceptance.     Dental Advisory Given  Plan Discussed with: CRNA  Anesthesia Plan Comments:         Anesthesia Quick Evaluation

## 2021-11-15 NOTE — Interval H&P Note (Signed)
History and Physical Interval Note:  CV: RRR Lungs: Clear  11/15/2021 3:52 PM  Carol Ortega  has presented today for surgery, with the diagnosis of Left Ureteral Stone.  The various methods of treatment have been discussed with the patient and family. After consideration of risks, benefits and other options for treatment, the patient has consented to  Procedure(s): CYSTOSCOPY/URETEROSCOPY/HOLMIUM LASER/STENT PLACEMENT (Left) as a surgical intervention.  The patient's history has been reviewed, patient examined, no change in status, stable for surgery.  I have reviewed the patient's chart and labs.  Questions were answered to the patient's satisfaction.     [place]

## 2021-11-15 NOTE — Discharge Instructions (Addendum)
AMBULATORY SURGERY  DISCHARGE INSTRUCTIONS   The drugs that you were given will stay in your system until tomorrow so for the next 24 hours you should not:  Drive an automobile Make any legal decisions Drink any alcoholic beverage   You may resume regular meals tomorrow.  Today it is better to start with liquids and gradually work up to solid foods.  You may eat anything you prefer, but it is better to start with liquids, then soup and crackers, and gradually work up to solid foods.   Please notify your doctor immediately if you have any unusual bleeding, trouble breathing, redness and pain at the surgery site, drainage, fever, or pain not relieved by medication.    Additional Instructions:    DISCHARGE INSTRUCTIONS FOR KIDNEY STONE/URETERAL STENT   MEDICATIONS:  1. Resume all your other meds from home.  2.  AZO (over-the-counter) can help with the burning/stinging when you urinate. 3.  Oxybutynin is for bladder/stent irritation, Rx was sent to your pharmacy.  ACTIVITY:  1. May resume regular activities in 24 hours. 2. No driving while on narcotic pain medications  3. Drink plenty of water  4. Continue to walk at home - you can still get blood clots when you are at home, so keep active, but don't over do it.  5. May return to work/school tomorrow or when you feel ready    SIGNS/SYMPTOMS TO CALL:  Common postoperative symptoms include urinary frequency, urgency, bladder spasm and blood in the urine  Please call us if you have a fever greater than 101.5, uncontrolled nausea/vomiting, uncontrolled pain, dizziness, unable to urinate, excessively bloody urine, chest pain, shortness of breath, leg swelling, leg pain, or any other concerns or questions.   You can reach [place] at [phone].   FOLLOW-UP:  1.  Due to scar tissue in the upper ureter your stent will need to remain in place for 2 weeks.  You will be contacted for an appointment for stent removal      Please  contact your physician with any problems or Same Day Surgery at [phone], Monday through Friday 6 am to 4 pm, or [place] at [facility] number at [phone].

## 2021-11-15 NOTE — Op Note (Signed)
Preoperative diagnosis:    Left UPJ calculus with renal colic  Postoperative diagnosis:  Same  Procedure:  Cystoscopy Left ureteroscopy and stone removal Ureteroscopic laser lithotripsy Left ureteral stent placement (6F/24 cm)  Left retrograde pyelography with interpretation  Surgeon:  C. , M.D.  Anesthesia: General  Complications: None  Intraoperative findings:  Cystoscopy: Bladder mucosa without solid or papillary lesions.  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  Impacted calculus at UPJ Dilated calyces without additional calculi on pyeloscopy Retrograde pyelography post procedure showed no filling defects, stone fragments or contrast extravasation  EBL: Minimal  Specimens: Calculus fragment for analysis   Indication: Carol Ortega is a 50 y.o. female with renal colic and a 10 mm left UPJ calculus. After reviewing the management options for treatment, the patient elected to proceed with the above surgical procedure(s). We have discussed the potential benefits and risks of the procedure, side effects of the proposed treatment, the likelihood of the patient achieving the goals of the procedure, and any potential problems that might occur during the procedure or recuperation. Informed consent has been obtained.  Description of procedure:  The patient was taken to the operating room and general anesthesia was induced.  The patient was placed in the dorsal lithotomy position, prepped and draped in the usual sterile fashion, and preoperative antibiotics were administered. A preoperative time-out was performed.   A 21 French cystoscope was lubricated and passed per urethra.  Panendoscopy was then performed with findings as described above.  Attention was directed to the left ureteral orifice and a 0.038 Sensor wire was then advanced up the ureter into the renal pelvis under fluoroscopic guidance.   The cystoscope was removed and a single channel digital flexible ureteroscope was passed per urethra.  The ureteral orifice was accessed with the scope alongside the guidewire and advanced proximally without difficulty until a stricture was noted in the proximal ureter as described above.  The ureteroscope was unable to negotiate the stricture.  A Super Stiff wire was placed through the ureteroscope and through the narrowed area however some resistance was met.  Retrograde pyelogram was performed to the ureteroscope and although no extravasation was noted the calyces were not well visualized.  The flexible cystoscope was removed and a 4.5 French semirigid ureteroscope was able to be advanced through the narrowed area and the calculus was identified at the UPJ as described above.  A 242 m holmium laser fiber was then placed through the ureteroscope and the calculus was dusted at a setting of 0.3J/40 Hz.  Once approximately 50% of the stone was treated it migrated into the renal pelvis.  Just beyond the stone it was noted the guidewire was submucosal.  It was pulled back to the renal pelvis then readvanced.  The semirigid scope was removed and the flexible ureteroscope was readvanced and able to be negotiated through the previously noted strictured area with minimal resistance.  The remainder the calculus was noted in the dependent portion of the renal pelvis and was further dusted.  Once completely dusted noncontrast laser lithotripsy was performed of the remaining fragments to the point where no fragments larger than the tip of the laser fiber were identified.  Retrograde pyelogram was performed through the ureteroscope with findings as described above.  All calyces were examined and no significant fragments were identified.  The ureteroscope was withdrawn and a small fragment was identified in the proximal ureter which was placed in a 1.9 French nitinol basket,   removed and sent for analysis.  The  remainder of the ureter was unremarkable.  A 57F/24 cm Contour ureteral stent was placed under fluoroscopic guidance.  There was a good curl noted in an upper pole calyx and the distal curl was well positioned as seen with fluoroscopy.  The bladder was then emptied with a cystoscope sheath. The patient appeared to tolerate the procedure well and without complications.  After anesthetic reversal she was transported to the PACU in stable condition.   Plan: Due to the strictured area in the proximal ureter recommend indwelling stent x2 weeks.

## 2021-11-16 ENCOUNTER — Other Ambulatory Visit (HOSPITAL_COMMUNITY): Payer: Self-pay

## 2021-11-16 ENCOUNTER — Other Ambulatory Visit: Payer: Self-pay | Admitting: Oncology

## 2021-11-16 ENCOUNTER — Telehealth: Payer: Self-pay | Admitting: Pharmacist

## 2021-11-16 ENCOUNTER — Encounter: Payer: Self-pay | Admitting: Urology

## 2021-11-16 ENCOUNTER — Other Ambulatory Visit: Payer: Self-pay | Admitting: *Deleted

## 2021-11-16 DIAGNOSIS — C9210 Chronic myeloid leukemia, BCR/ABL-positive, not having achieved remission: Secondary | ICD-10-CM

## 2021-11-16 DIAGNOSIS — D471 Chronic myeloproliferative disease: Secondary | ICD-10-CM

## 2021-11-16 DIAGNOSIS — D72821 Monocytosis (symptomatic): Secondary | ICD-10-CM

## 2021-11-16 MED ORDER — DASATINIB 100 MG PO TABS
100.0000 mg | ORAL_TABLET | Freq: Every day | ORAL | 0 refills | Status: DC
  Filled 2021-11-16 (×2): qty 30, 30d supply, fill #0

## 2021-11-16 MED ORDER — DASATINIB 100 MG PO TABS: 100.0000 mg | ORAL_TABLET | Freq: Every day | ORAL | Status: DC

## 2021-11-16 NOTE — Telephone Encounter (Signed)
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  CBC from 11/11/21 assessed, no relevant lab abnormalities. Prescription dose and frequency assessed.   Current medication list in Epic reviewed, no DDIs with dasatinib identified: **Omeprazole on pts med list but listed as not taking, will verify with patient at visit on Monday 11/21/21  Evaluated chart and no patient barriers to medication adherence identified.   Prescription has been e-scribed to the [facility] for benefits analysis and approval.  Oral Oncology Clinic will continue to follow for insurance authorization, copayment issues, initial counseling and start date.   Darl Pikes, PharmD, BCPS, BCOP, CPP Hematology/Oncology Clinical Pharmacist Practitioner [facility]/DB/AP [facility] [place] [phone]  11/16/2021 11:09 AM

## 2021-11-18 LAB — BCR-ABL1, CML/ALL, PCR, QUANT
E1A2 Transcript: 0.5645 %
Interpretation (BCRAL):: POSITIVE

## 2021-11-21 ENCOUNTER — Inpatient Hospital Stay (HOSPITAL_BASED_OUTPATIENT_CLINIC_OR_DEPARTMENT_OTHER): Payer: Medicaid Other | Admitting: Oncology

## 2021-11-21 ENCOUNTER — Encounter: Payer: Self-pay | Admitting: Oncology

## 2021-11-21 ENCOUNTER — Inpatient Hospital Stay: Payer: Medicaid Other | Admitting: Pharmacist

## 2021-11-21 ENCOUNTER — Other Ambulatory Visit: Payer: Self-pay

## 2021-11-21 ENCOUNTER — Inpatient Hospital Stay: Payer: Medicaid Other | Attending: Oncology

## 2021-11-21 VITALS — BP 124/85 | HR 80 | Temp 98.1°F | Resp 18 | Wt 318.1 lb

## 2021-11-21 DIAGNOSIS — C9210 Chronic myeloid leukemia, BCR/ABL-positive, not having achieved remission: Secondary | ICD-10-CM | POA: Diagnosis not present

## 2021-11-21 DIAGNOSIS — D471 Chronic myeloproliferative disease: Secondary | ICD-10-CM

## 2021-11-21 DIAGNOSIS — Z79899 Other long term (current) drug therapy: Secondary | ICD-10-CM | POA: Insufficient documentation

## 2021-11-21 HISTORY — DX: Chronic myeloid leukemia, BCR/ABL-positive, not having achieved remission: C92.10

## 2021-11-21 LAB — CBC WITH DIFFERENTIAL/PLATELET
Abs Immature Granulocytes: 25.64 10*3/uL — ABNORMAL HIGH (ref 0.00–0.07)
Basophils Absolute: 5.8 10*3/uL — ABNORMAL HIGH (ref 0.0–0.1)
Basophils Relative: 4 %
Eosinophils Absolute: 2.1 10*3/uL — ABNORMAL HIGH (ref 0.0–0.5)
Eosinophils Relative: 2 %
HCT: 36.7 % (ref 36.0–46.0)
Hemoglobin: 11.5 g/dL — ABNORMAL LOW (ref 12.0–15.0)
Immature Granulocytes: 18 %
Lymphocytes Relative: 8 %
Lymphs Abs: 11.8 10*3/uL — ABNORMAL HIGH (ref 0.7–4.0)
MCH: 27.3 pg (ref 26.0–34.0)
MCHC: 31.3 g/dL (ref 30.0–36.0)
MCV: 87 fL (ref 80.0–100.0)
Monocytes Absolute: 10.4 10*3/uL — ABNORMAL HIGH (ref 0.1–1.0)
Monocytes Relative: 7 %
Neutro Abs: 86.6 10*3/uL — ABNORMAL HIGH (ref 1.7–7.7)
Neutrophils Relative %: 61 %
Platelets: 267 10*3/uL (ref 150–400)
RBC: 4.22 MIL/uL (ref 3.87–5.11)
RDW: 20.4 % — ABNORMAL HIGH (ref 11.5–15.5)
Smear Review: NORMAL
WBC: 142.3 10*3/uL (ref 4.0–10.5)
nRBC: 0.8 % — ABNORMAL HIGH (ref 0.0–0.2)

## 2021-11-21 LAB — CALCULI, WITH PHOTOGRAPH (CLINICAL LAB)
Calcium Oxalate Dihydrate: 90 %
Hydroxyapatite: 10 %
Weight Calculi: 4 mg

## 2021-11-21 LAB — BASIC METABOLIC PANEL
Anion gap: 7 (ref 5–15)
BUN: 11 mg/dL (ref 6–20)
CO2: 26 mmol/L (ref 22–32)
Calcium: 8.7 mg/dL — ABNORMAL LOW (ref 8.9–10.3)
Chloride: 104 mmol/L (ref 98–111)
Creatinine, Ser: 0.9 mg/dL (ref 0.44–1.00)
GFR, Estimated: >60 mL/min (ref >60)
Glucose, Bld: 120 mg/dL — ABNORMAL HIGH (ref 70–99)
Potassium: 3.7 mmol/L (ref 3.5–5.1)
Sodium: 137 mmol/L (ref 135–145)

## 2021-11-21 LAB — TSH: TSH: 1.903 u[IU]/mL (ref 0.350–4.500)

## 2021-11-21 LAB — URIC ACID: Uric Acid, Serum: 6 mg/dL (ref 2.5–7.1)

## 2021-11-21 NOTE — Progress Notes (Signed)
Pt states her bones have been aching or tingling just a new sx that has started since yesterday. Not painful but does experience tingling.

## 2021-11-21 NOTE — Progress Notes (Signed)
Carol Ortega  Telephone:([phone] Fax:([phone]  Patient Care Team: Berkley Harvey, NP as PCP - General (Nurse Practitioner)   Name of the patient: Carol Ortega  [id number]  08/21/1971   Date of visit: 11/21/21  HPI: Patient is a 50 y.o. female with newly diagnosed CML. Planned treatment with Sprycel (dasatinib). She started on treatment on 11/17/21.  Reason for Consult: Dasatinib oral chemotherapy education.   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    ALLERGIES:  is allergic to bee venom, contrast media [iodinated diagnostic agents], eggs or egg-derived products, other, penicillins, wellbutrin [bupropion], latex, and oxycodone-acetaminophen.  MEDICATIONS:  Current Outpatient Medications  Medication Sig Dispense Refill   albuterol (PROVENTIL HFA;VENTOLIN HFA) 108 (90 Base) MCG/ACT inhaler Inhale 2 puffs into the lungs every 6 (six) hours as needed for wheezing or shortness of breath. 1 Inhaler 2   albuterol (PROVENTIL) (2.5 MG/3ML) 0.083% nebulizer solution Take 3 mLs (2.5 mg total) by nebulization every 6 (six) hours as needed for wheezing or shortness of breath. 75 mL 12   allopurinol (ZYLOPRIM) 300 MG tablet Take 1 tablet (300 mg total) by mouth daily. (Patient not taking: Reported on 11/21/2021) 30 tablet 0   cephALEXin (KEFLEX)  500 MG capsule Take 1 capsule (500 mg total) by mouth 4 (four) times daily for 10 days. 40 capsule 0   dasatinib (SPRYCEL) 100 MG tablet Take 1 tablet (100 mg total) by mouth daily. 30 tablet 0   diclofenac Sodium (VOLTAREN) 1 % GEL Apply topically.     diphenhydrAMINE (BENADRYL) 50 MG tablet Take one hour prior to MRI (Patient not taking: Reported on 11/21/2021)     EPINEPHrine 0.3 mg/0.3 mL IJ SOAJ injection Inject into the muscle. (Patient not taking: Reported on 11/21/2021)     gabapentin (NEURONTIN) 100 MG capsule 100 mg in the morning for 1 week, then increase to 100 mg twice daily for 1 week, then increase to 200 mg in the morning and 100 mg at night for 1 week, then increase to 200 mg twice daily and continue. (Patient not taking: Reported on 11/21/2021)     meloxicam (MOBIC) 15 MG tablet One tablet daily x 10 days, then prn for shoulder pain. (Patient not taking: Reported on 11/21/2021)     morphine (MSIR) 15 MG tablet Take 15-30 mg by mouth every 6 (six) hours as needed. (Patient not taking: Reported on 11/21/2021)     oxybutynin (DITROPAN) 5 MG tablet 1 tab tid prn frequency,urgency, bladder spasm 15 tablet 0   progesterone (PROMETRIUM) 100 MG capsule 1 po q hs x 12 hs per month x 2 mths (Patient not taking: Reported on 11/21/2021)  propranolol (INDERAL) 10 MG tablet TAKE 1 TABLET BY MOUTH THREE TIMES DAILY AS NEEDED FOR SEVERE ANXIETY SYMPTOMS (Patient not taking: Reported on 11/21/2021)     tamsulosin (FLOMAX) 0.4 MG CAPS capsule Take 1 capsule (0.4 mg total) by mouth daily. 30 capsule 0   tranexamic acid (LYSTEDA) 650 MG TABS tablet Take 1,300 mg by mouth 3 (three) times daily. (Patient not taking: Reported on 11/21/2021)     traZODone (DESYREL) 150 MG tablet Take 1 tablet by mouth at bedtime. (Patient not taking: Reported on 11/21/2021)     No current facility-administered medications for this visit.    VITAL SIGNS: There were no vitals taken for this visit. There were no vitals filed  for this visit.  Estimated body mass index is 51.34 kg/m as calculated from the following:   Height as of 11/15/21: 5\' 6"  (1.676 m).   Weight as of an earlier encounter on 11/21/21: 144.3 kg (318 lb 1.6 oz).  LABS: CBC:    Component Value Date/Time   WBC PENDING 11/21/2021 0923   HGB 11.5 (L) 11/21/2021 0923   HGB 11.9 11/08/2021 1744   HCT 36.7 11/21/2021 0923   HCT 41.2 09/26/2018 1612   PLT 267 11/21/2021 0923   PLT 377 09/26/2018 1612   MCV 87.0 11/21/2021 0923   MCV 84 09/26/2018 1612   NEUTROABS PENDING 11/21/2021 0923   NEUTROABS 3.4 09/26/2018 1612   LYMPHSABS PENDING 11/21/2021 0923   LYMPHSABS 3.0 09/26/2018 1612   MONOABS PENDING 11/21/2021 0923   EOSABS PENDING 11/21/2021 0923   EOSABS 0.3 09/26/2018 1612   BASOSABS PENDING 11/21/2021 0923   BASOSABS 0.1 09/26/2018 1612   Comprehensive Metabolic Panel:    Component Value Date/Time   NA 137 11/21/2021 0923   NA 142 09/26/2018 1612   K 3.7 11/21/2021 0923   CL 104 11/21/2021 0923   CO2 26 11/21/2021 0923   BUN 11 11/21/2021 0923   BUN 9 09/26/2018 1612   CREATININE 0.90 11/21/2021 0923   GLUCOSE 120 (H) 11/21/2021 0923   CALCIUM 8.7 (L) 11/21/2021 0923   AST 23 11/08/2021 1111   ALT 15 11/08/2021 1111   ALKPHOS 76 11/08/2021 1111   BILITOT 0.8 11/08/2021 1111   BILITOT 0.3 09/26/2018 1612   PROT 8.5 (H) 11/08/2021 1111   PROT 7.7 09/26/2018 1612   ALBUMIN 4.0 11/08/2021 1111   ALBUMIN 4.4 09/26/2018 1612     Present during today's visit: patient only  Start plan: patient started dasatinib 11/17/21.   Patient Education I spoke with patient for overview of new oral chemotherapy medication: dasatinib   Administration: Counseled patient on administration, dosing, side effects, monitoring, drug-food interactions, safe handling, storage, and disposal. Patient will take 1 tablet (100 mg total) by mouth daily.  Side Effects: Side effects include but not limited to: decrease in wbc/hgb/plt, edema,  headache, diarrhea.    Drug-drug Interactions (DDI): No DDIs currently, patient is nolonger taking omeprazole and it was removed from her medication list  Adherence: After discussion with patient no patient barriers to medication adherence identified.  Reviewed with patient importance of keeping a medication schedule and plan for any missed doses.  Ms. Seckel voiced understanding and appreciation. All questions answered. Medication handout and NCCN CML Patient Guideline provided.  Provided patient with [facility] phone number. Patient knows to call the office with questions or concerns. Oral Chemotherapy Navigation Clinic will continue to follow.  Patient expressed understanding and was in agreement with this plan. She also  understands that She can call clinic at any time with any questions, concerns, or complaints.   Medication Access Issues: No issues, fills at [facility]  Follow-up plan: RTC in 2 weeks, she will have weekly CBCs collected  Thank you for allowing me to participate in the care of this patient.   Time Total: 25 mins  Visit consisted of counseling and education on dealing with issues of symptom management in the setting of serious and potentially life-threatening illness.Greater than 50%  of this time was spent counseling and coordinating care related to the above assessment and plan.  Signed by: Darl Pikes, PharmD, BCPS, Salley Slaughter, CPP Hematology/Oncology Clinical Pharmacist Practitioner [place]/DB/AP Oral [place] [facility] [phone]  11/21/2021 10:01 AM

## 2021-11-21 NOTE — Progress Notes (Signed)
Hematology/Oncology Consult note [facility]  [phone] [phone]  Patient Care Team: Berkley Harvey, NP as PCP - General (Nurse Practitioner)   Name of the patient: Carol Ortega  [id number]  1971-11-17   Date of visit: 11/21/21  Diagnosis-chronic phase CML  Chief complaint/ Reason for visit-discuss results of blood work and further management  Heme/Onc history: patient is a 50 year old African-American femaleWho presents to the ER with symptoms of left-sided flank pain.  CT abdomen and pelvis without contrast showed moderate left hydronephrosis secondary to a 1.1 x 0.9 x 1.3 cm calculus at the left ureteropelvic junction/proximal left ureter.  No other evidence of adenopathy and spleen appeared normal.  Incidentally patient was noted to have a white cell count of 138 which on repeat check was 133.  Platelet counts normal and hemoglobin was 12.1.  Last normal CBC was in April 2021 when white count was 7.2.  Presently differential mainly shows neutrophilia along with lymphocytosis monocytosis basophilia and eosinophilia.  Immature granulocytes were seen.  Pathology smear review shows findings concerning for myeloproliferative neoplasm.  Blasts possibly less than 20% and BCR ABL testing was recommended.Patient noted to have wbc of 18 in may 2022 again with predominant left shift.  BCR ABL FISH testing showed 100% nuclei positive for BCR-ABL gene fusion signals.  BCR ABL PCR testing showed B2 A2 transcript 847.313 and B3 A2 transcript less than 0.0032%.  E1 A2 transcript 0.5645%.  Peripheral blood flow cytometry showedAberrant myeloblast population about 2% of the leukocytes.  Absolute neutrophilia eosinophilia and basophilia.  Interval history-patient received her supply of Dasatinib about 5 days ago and was told to not start the drug until she sees me but she started it anyways.  States that she occasionally feels dizzy when she takes the  medication but otherwise tolerating it well.  Symptoms of flank pain have improved after she underwent stent placement by urology.  CT abdomen showed no splenomegaly  ECOG PS- 1 Pain scale- 0   Review of systems- Review of Systems  Constitutional:  Positive for malaise/fatigue. Negative for chills, fever and weight loss.  HENT:  Negative for congestion, ear discharge and nosebleeds.   Eyes:  Negative for blurred vision.  Respiratory:  Negative for cough, hemoptysis, sputum production, shortness of breath and wheezing.   Cardiovascular:  Negative for chest pain, palpitations, orthopnea and claudication.  Gastrointestinal:  Negative for abdominal pain, blood in stool, constipation, diarrhea, heartburn, melena, nausea and vomiting.  Genitourinary:  Negative for dysuria, flank pain, frequency, hematuria and urgency.  Musculoskeletal:  Negative for back pain, joint pain and myalgias.  Skin:  Negative for rash.  Neurological:  Negative for dizziness, tingling, focal weakness, seizures, weakness and headaches.  Endo/Heme/Allergies:  Does not bruise/bleed easily.  Psychiatric/Behavioral:  Negative for depression and suicidal ideas. The patient does not have insomnia.      Allergies  Allergen Reactions   Bee Venom Anaphylaxis   Contrast Media [Iodinated Diagnostic Agents] Anaphylaxis   Eggs Or Egg-Derived Products Anaphylaxis   Other Anaphylaxis    Surgical dye.. Pt states ok to take if previously taken benadryl or prednisone   Penicillins Hives and Other (See Comments)    Has patient had a PCN reaction causing immediate rash, facial/tongue/throat swelling, SOB or lightheadedness with hypotension: No Has patient had a PCN reaction causing severe rash involving mucus membranes or skin necrosis: No Has patient had a PCN reaction that required hospitalization No Has patient had a PCN reaction occurring  within the last 10 years: No If all of the above answers are "NO", then may proceed with  Cephalosporin use.   Wellbutrin [Bupropion] Other (See Comments)    irritiability   Latex Rash   Oxycodone-Acetaminophen Itching     Past Medical History:  Diagnosis Date   Anemia    Anxiety    Asthma    seasonal   Benign brain tumor ([place])    Brain tumor ([place]) 2014   tx with radiation   Complication of anesthesia 1999   lung collapse after surgery for gallbladder   Depression    Ectopic pregnancy    Headache    migraines   Hoarseness of voice    Hypertension    Kidney stone    Obesity    Parathyroid abnormality ([place])    Pulmonary emboli ([place]) 2011   Pulmonary embolism ([facility])    Radiation    Brain tumor   UTI (urinary tract infection)    Vitamin D deficiency      Past Surgical History:  Procedure Laterality Date   CHOLECYSTECTOMY  1999   CYSTOSCOPY W/ RETROGRADES Left 03/01/2018   Procedure: CYSTOSCOPY WITH RETROGRADE PYELOGRAM;  Surgeon: Abbie Sons, MD;  Location: [facility];  Service: Urology;  Laterality: Left;   CYSTOSCOPY W/ URETERAL STENT PLACEMENT Right 04/08/2017   Procedure: CYSTOSCOPY WITH RETROGRADE PYELOGRAM/URETERAL STENT PLACEMENT;  Surgeon: Rana Snare, MD;  Location: [facility];  Service: Urology;  Laterality: Right;   CYSTOSCOPY W/ URETERAL STENT PLACEMENT Left 03/12/2018   Procedure: CYSTOSCOPY WITH STENT REPLACEMENT;  Surgeon: Abbie Sons, MD;  Location: [facility];  Service: Urology;  Laterality: Left;   CYSTOSCOPY W/ URETEROSCOPY     CYSTOSCOPY WITH RETROGRADE PYELOGRAM, URETEROSCOPY AND STENT PLACEMENT Left 01/20/2014   Procedure: LEFT URETEROSCOPY WITH STENT PLACEMENT;  Surgeon: Irine Seal, MD;  Location: [facility];  Service: Urology;  Laterality: Left;   CYSTOSCOPY WITH RETROGRADE PYELOGRAM, URETEROSCOPY AND STENT PLACEMENT Bilateral 04/23/2015   Procedure: CYSTOSCOPY WITH BILATERAL RETROGRADE PYELOGRAM, URETEROSCOPY AND STENT PLACEMENT;  Surgeon: Alexis Frock, MD;  Location: [facility];  Service: Urology;  Laterality: Bilateral;   CYSTOSCOPY WITH  RETROGRADE PYELOGRAM, URETEROSCOPY AND STENT PLACEMENT Right 04/13/2017   Procedure: CYSTOSCOPY WITH RETROGRADE PYELOGRAM, URETEROSCOPY AND STENT EXCHANGE;  Surgeon: Alexis Frock, MD;  Location: [facility];  Service: Urology;  Laterality: Right;   CYSTOSCOPY WITH STENT PLACEMENT Left 01/12/2014   Procedure: CYSTOSCOPY WITH STENT PLACEMENT left retrograde;  Surgeon: Irine Seal, MD;  Location: [facility];  Service: Urology;  Laterality: Left;   CYSTOSCOPY WITH STENT PLACEMENT Left 03/01/2018   Procedure: CYSTOSCOPY WITH STENT PLACEMENT;  Surgeon: Abbie Sons, MD;  Location: [facility];  Service: Urology;  Laterality: Left;   CYSTOSCOPY/URETEROSCOPY/HOLMIUM LASER/STENT PLACEMENT Left 03/12/2018   Procedure: CYSTOSCOPY/URETEROSCOPY/HOLMIUM LASER;  Surgeon: Abbie Sons, MD;  Location: [facility];  Service: Urology;  Laterality: Left;   CYSTOSCOPY/URETEROSCOPY/HOLMIUM LASER/STENT PLACEMENT Left 11/15/2021   Procedure: CYSTOSCOPY/URETEROSCOPY/HOLMIUM LASER/STENT PLACEMENT;  Surgeon: Abbie Sons, MD;  Location: [facility];  Service: Urology;  Laterality: Left;   HOLMIUM LASER APPLICATION Left 06/24/4127   Procedure: HOLMIUM LASER APPLICATION;  Surgeon: Irine Seal, MD;  Location: [facility];  Service: Urology;  Laterality: Left;   HOLMIUM LASER APPLICATION Bilateral 06/24/6766   Procedure: HOLMIUM LASER APPLICATION;  Surgeon: Alexis Frock, MD;  Location: [facility];  Service: Urology;  Laterality: Bilateral;   HOLMIUM LASER APPLICATION Right 01/27/4708   Procedure: HOLMIUM LASER APPLICATION;  Surgeon: Alexis Frock, MD;  Location: [facility];  Service: Urology;  Laterality: Right;   kidney stone removal     LITHOTRIPSY     PARATHYROIDECTOMY Right 11/13/2016   Procedure: RIGHT SUPERIOR PARATHYROIDECTOMY;  Surgeon: Armandina Gemma, MD;  Location: [facility];  Service: General;  Laterality: Right;   surgery for,ectopic pregnancy  2011    1 fallopian tube rupture   TUBAL LIGATION  2011    Social History   Socioeconomic  History   Marital status: Divorced    Spouse name: Not on file   Number of children: 4   Years of education: Not on file   Highest education level: Some college, no degree  Occupational History   Not on file  Tobacco Use   Smoking status: Never   Smokeless tobacco: Never  Vaping Use   Vaping Use: Never used  Substance and Sexual Activity   Alcohol use: Not Currently   Drug use: Yes    Types: Marijuana   Sexual activity: Not Currently  Other Topics Concern   Not on file  Social History Narrative   Lives at home with her son, independent at baselinepend   Social Determinants of Health   Financial Resource Strain: Not on file  Food Insecurity: Not on file  Transportation Needs: Not on file  Physical Activity: Not on file  Stress: Not on file  Social Connections: Not on file  Intimate Partner Violence: Not on file    Family History  Problem Relation Age of Onset   Bell's palsy Mother    Heart failure Mother    Hypertension Mother    Lupus Mother    Anxiety disorder Mother    Depression Mother    Stroke Mother    Asthma Father    Hypertension Father    Hyperlipidemia Father    Anxiety disorder Father    Depression Father    Hypertension Maternal Grandmother    Diabetes Maternal Grandmother    Sarcoidosis Maternal Grandmother    Anxiety disorder Sister    Depression Sister    Bipolar disorder Sister    Depression Son    Bipolar disorder Son      Current Outpatient Medications:    albuterol (PROVENTIL HFA;VENTOLIN HFA) 108 (90 Base) MCG/ACT inhaler, Inhale 2 puffs into the lungs every 6 (six) hours as needed for wheezing or shortness of breath., Disp: 1 Inhaler, Rfl: 2   albuterol (PROVENTIL) (2.5 MG/3ML) 0.083% nebulizer solution, Take 3 mLs (2.5 mg total) by nebulization every 6 (six) hours as needed for wheezing or shortness of breath., Disp: 75 mL, Rfl: 12   cephALEXin (KEFLEX) 500 MG capsule, Take 1 capsule (500 mg total) by mouth 4 (four) times daily for  10 days., Disp: 40 capsule, Rfl: 0   dasatinib (SPRYCEL) 100 MG tablet, Take 1 tablet (100 mg total) by mouth daily., Disp: 30 tablet, Rfl: 0   diclofenac Sodium (VOLTAREN) 1 % GEL, Apply topically., Disp: , Rfl:    oxybutynin (DITROPAN) 5 MG tablet, 1 tab tid prn frequency,urgency, bladder spasm, Disp: 15 tablet, Rfl: 0   tamsulosin (FLOMAX) 0.4 MG CAPS capsule, Take 1 capsule (0.4 mg total) by mouth daily., Disp: 30 capsule, Rfl: 0   allopurinol (ZYLOPRIM) 300 MG tablet, Take 1 tablet (300 mg total) by mouth daily. (Patient not taking: Reported on 11/21/2021), Disp: 30 tablet, Rfl: 0   diphenhydrAMINE (BENADRYL) 50 MG tablet, Take one hour prior to MRI (Patient not taking: Reported on 11/21/2021), Disp: , Rfl:    EPINEPHrine 0.3 mg/0.3 mL IJ SOAJ injection,  Inject into the muscle. (Patient not taking: Reported on 11/21/2021), Disp: , Rfl:    gabapentin (NEURONTIN) 100 MG capsule, 100 mg in the morning for 1 week, then increase to 100 mg twice daily for 1 week, then increase to 200 mg in the morning and 100 mg at night for 1 week, then increase to 200 mg twice daily and continue. (Patient not taking: Reported on 11/21/2021), Disp: , Rfl:    meloxicam (MOBIC) 15 MG tablet, One tablet daily x 10 days, then prn for shoulder pain. (Patient not taking: Reported on 11/21/2021), Disp: , Rfl:    morphine (MSIR) 15 MG tablet, Take 15-30 mg by mouth every 6 (six) hours as needed. (Patient not taking: Reported on 11/21/2021), Disp: , Rfl:    progesterone (PROMETRIUM) 100 MG capsule, 1 po q hs x 12 hs per month x 2 mths (Patient not taking: Reported on 11/21/2021), Disp: , Rfl:    propranolol (INDERAL) 10 MG tablet, TAKE 1 TABLET BY MOUTH THREE TIMES DAILY AS NEEDED FOR SEVERE ANXIETY SYMPTOMS (Patient not taking: Reported on 11/21/2021), Disp: , Rfl:    tranexamic acid (LYSTEDA) 650 MG TABS tablet, Take 1,300 mg by mouth 3 (three) times daily. (Patient not taking: Reported on 11/21/2021), Disp: , Rfl:    traZODone  (DESYREL) 150 MG tablet, Take 1 tablet by mouth at bedtime. (Patient not taking: Reported on 11/21/2021), Disp: , Rfl:   Physical exam:  Vitals:   11/21/21 0950  BP: 124/85  Pulse: 80  Resp: 18  Temp: 98.1 F (36.7 C)  SpO2: 97%  Weight: (!) 318 lb 1.6 oz (144.3 kg)   Physical Exam Cardiovascular:     Rate and Rhythm: Normal rate and regular rhythm.     Heart sounds: Normal heart sounds.  Pulmonary:     Effort: Pulmonary effort is normal.     Breath sounds: Normal breath sounds.  Skin:    General: Skin is warm and dry.  Neurological:     Mental Status: She is alert and oriented to person, place, and time.     CMP Latest Ref Rng & Units 11/21/2021  Glucose 70 - 99 mg/dL 120(H)  BUN 6 - 20 mg/dL 11  Creatinine 0.44 - 1.00 mg/dL 0.90  Sodium 135 - 145 mmol/L 137  Potassium 3.5 - 5.1 mmol/L 3.7  Chloride 98 - 111 mmol/L 104  CO2 22 - 32 mmol/L 26  Calcium 8.9 - 10.3 mg/dL 8.7(L)  Total Protein 6.5 - 8.1 g/dL -  Total Bilirubin 0.3 - 1.2 mg/dL -  Alkaline Phos 38 - 126 U/L -  AST 15 - 41 U/L -  ALT 0 - 44 U/L -   CBC Latest Ref Rng & Units 11/21/2021  WBC 4.0 - 10.5 K/uL 142.3(HH)  Hemoglobin 12.0 - 15.0 g/dL 11.5(L)  Hematocrit 36.0 - 46.0 % 36.7  Platelets 150 - 400 K/uL 267    No images are attached to the encounter.  CT Abdomen Pelvis Wo Contrast  Result Date: 11/08/2021 CLINICAL DATA:  Left flank pain, kidney stone suspected. EXAM: CT ABDOMEN AND PELVIS WITHOUT CONTRAST TECHNIQUE: Multidetector CT imaging of the abdomen and pelvis was performed following the standard protocol without IV contrast. COMPARISON:  CT examination dated March 16, 2020 FINDINGS: Lower chest: No acute abnormality. Hepatobiliary: No focal liver abnormality is seen. Status post cholecystectomy. No biliary dilatation. Pancreas: Unremarkable. No pancreatic ductal dilatation or surrounding inflammatory changes. Spleen: Normal in size without focal abnormality. Adrenals/Urinary Tract: Adrenal  glands are  unremarkable. Moderate left hydronephrosis secondary to a 1.1 x 0.9 x 1.3 cm calculus at the left ureteropelvic junction/proximal left ureter. Mild left perinephric fat stranding. There is another punctate nonobstructing calculus in the upper pole of the left kidney. Simple cysts in the lower pole of the right kidney. No right hydroureteronephrosis. Right kidney and ureter bladder is unremarkable. Stomach/Bowel: Stomach is within normal limits. Appendix appears normal. No evidence of bowel wall thickening, distention, or inflammatory changes. Vascular/Lymphatic: No significant vascular findings are present. No enlarged abdominal or pelvic lymph nodes. Reproductive: Uterus and bilateral adnexa are unremarkable. Other: No abdominal wall hernia or abnormality. No abdominopelvic ascites. Musculoskeletal: No acute or significant osseous findings. Mild multilevel degenerative disc disease of the lumbar spine. IMPRESSION: Moderate left hydronephrosis secondary to a 1.1 x 0.9 x 1.3 cm calculus at the left ureteropelvic junction/proximal left ureter. Electronically Signed   By: Keane Police D.O.   On: 11/08/2021 11:52   DG Chest Port 1 View  Result Date: 11/08/2021 CLINICAL DATA:  Leukocytosis.  Low back and left flank pain. EXAM: PORTABLE CHEST 1 VIEW COMPARISON:  None. FINDINGS: The heart size and mediastinal contours are within normal limits. Low lung volumes without evidence of focal consolidation or pleural effusion. The visualized skeletal structures are unremarkable. IMPRESSION: No active disease. Electronically Signed   By: Keane Police D.O.   On: 11/08/2021 17:22   DG OR UROLOGY CYSTO IMAGE ([place])  Result Date: 11/15/2021 There is no interpretation for this exam.  This order is for images obtained during a surgical procedure.  Please See "Surgeries" Tab for more information regarding the procedure.     Assessment and plan- Patient is a 50 y.o. female with newly diagnosed chronic phase  CML  Chronic phase CML: I will obtain a bone marrow biopsy as well to confirm that we are not dealing with any accelerated phase based on blast percentage.  Peripheral blood flow cytometry showed less than 2% aberrant myeloblasts.  BCR ABL FISH testing showed 100% nuclei positive for BCR ABL fusion signals and BCR ABL PCR testing showed predominantly B2 A2 and P2 10 transcripts and a small P1 90 fusion transcript which would be consistent with chronic phase CML.  Discussed that CML is treatable but not curable.  She falls under the low risk Sokol score and EUTOS score based on the fact that she has less than 2% aberrant myeloblasts and a peripheral blood with a normal platelet count and no splenomegaly.  Her baseline EKG was normal with a normal QTc interval.Also had a chest x-ray on 11/08/2021 which did not show any baseline pleural effusion.  At this time I would recommend doing Dasatinib 100 mg daily until progression or toxicity as we may consider treatment free response as one of her important goals in the future.  Discussed the need to avoid PPIs along with Dasatinib.The DASISION trial randomly assigned 519 patients with previously untreated CML to dasatinib 100 mg daily versus imatinib 400 mg daily [14,59-61]. At five-year follow-up, compared with imatinib, dasatinib did not achieve superior PFS or OS, but 26 percent of patients initially treated with imatinib were subsequently treated with a 2G TKI. Dasatinib generally produced faster and deeper molecular responses, with MR at three months (84 versus 64 percent) and MMR 4.5 (42 versus 33 percent). After five years, progression to AP/BC occurred in 4.6 and 7.3 percent of patients treated with dasatinib and imatinib, respectively. AEs led to discontinuation of therapy in 16 and 7 percent of patients  treated with dasatinib and imatinib, respectively [14]. Except for pleural effusion (28 versus 1 percent), nonhematologic AEs (eg, nausea, vomiting, rash,  myalgia) and hematologic AEs were less common with dasatinib than with imatinib.   Discussed risks and benefits of Dasatinib including all but not limited to possible risk of pleural effusions, fatigue leg swelling diarrhea pulmonary hypertension and EKG abnormalities.  Baseline TSH normal.  Treatment will be given with a palliative intent.  Patient understands and agrees to proceed as planned  Patient is already on day 5 of Dasatinib and we will continue to monitor her CBC on a weekly basis and she will be seen by covering NP in 2 weeks and I will see her back in 4 weeks.  Patient is also met with Thayer Dallas from pharmacy who went over the risks and benefits of Dasatinib with her as well.  She will also be following the patient along during the course of treatment.   Cancer Staging  CML (chronic myelocytic leukemia) ([place]) Staging form: Chronic Myeloid Leukemia, AJCC 8th Edition - Clinical stage from 11/21/2021: Ph+ - Signed by Sindy Guadeloupe, MD on 11/21/2021 Stage prefix: Initial diagnosis     Visit Diagnosis 1. CML (chronic myelocytic leukemia) ([place])   2. High risk medication use      Dr. Randa Evens, MD, MPH [facility] [phone] 11/21/2021 4:07 PM

## 2021-11-22 NOTE — Anesthesia Postprocedure Evaluation (Signed)
Anesthesia Post Note  Patient: Jazsmin Couse  Procedure(s) Performed: CYSTOSCOPY/URETEROSCOPY/HOLMIUM LASER/STENT PLACEMENT (Left)  Patient location during evaluation: PACU Anesthesia Type: General Level of consciousness: awake and alert Pain management: pain level controlled Vital Signs Assessment: post-procedure vital signs reviewed and stable Respiratory status: spontaneous breathing, nonlabored ventilation, respiratory function stable and patient connected to nasal cannula oxygen Cardiovascular status: blood pressure returned to baseline and stable Postop Assessment: no apparent nausea or vomiting Anesthetic complications: no   No notable events documented.   Last Vitals:  Vitals:   11/15/21 1945 11/15/21 2012  BP: (!) 142/78 (!) 147/105  Pulse: 93 94  Resp:  16  Temp:  (!) 36.1 C  SpO2: 95% 93%    Last Pain:  Vitals:   11/16/21 1021  TempSrc:   PainSc: 0-No pain                 Martha Clan

## 2021-11-25 ENCOUNTER — Telehealth: Payer: Self-pay

## 2021-11-25 NOTE — Telephone Encounter (Signed)
Tried to reach pt to inform of BM on 12/22 with arrival time of 8am for 9am BM Biopsy: pt did not answer, left VM with appointment details. Stated I will reach back out later on today to make sure she has recieved information. Also, provided a call back phone number in case of any questions or concerns.

## 2021-11-28 ENCOUNTER — Other Ambulatory Visit: Payer: Self-pay

## 2021-11-28 ENCOUNTER — Inpatient Hospital Stay: Payer: Medicaid Other

## 2021-11-28 ENCOUNTER — Telehealth: Payer: Self-pay | Admitting: Pharmacist

## 2021-11-28 DIAGNOSIS — C9210 Chronic myeloid leukemia, BCR/ABL-positive, not having achieved remission: Secondary | ICD-10-CM | POA: Diagnosis not present

## 2021-11-28 DIAGNOSIS — Z79899 Other long term (current) drug therapy: Secondary | ICD-10-CM | POA: Diagnosis not present

## 2021-11-28 LAB — CBC WITH DIFFERENTIAL/PLATELET
Abs Immature Granulocytes: 6.95 10*3/uL — ABNORMAL HIGH (ref 0.00–0.07)
Basophils Absolute: 1.7 10*3/uL — ABNORMAL HIGH (ref 0.0–0.1)
Basophils Relative: 3 %
Eosinophils Absolute: 0.6 10*3/uL — ABNORMAL HIGH (ref 0.0–0.5)
Eosinophils Relative: 1 %
HCT: 38.2 % (ref 36.0–46.0)
Hemoglobin: 11.9 g/dL — ABNORMAL LOW (ref 12.0–15.0)
Immature Granulocytes: 12 %
Lymphocytes Relative: 9 %
Lymphs Abs: 5.3 10*3/uL — ABNORMAL HIGH (ref 0.7–4.0)
MCH: 27 pg (ref 26.0–34.0)
MCHC: 31.2 g/dL (ref 30.0–36.0)
MCV: 86.8 fL (ref 80.0–100.0)
Monocytes Absolute: 4.2 10*3/uL — ABNORMAL HIGH (ref 0.1–1.0)
Monocytes Relative: 7 %
Neutro Abs: 38.9 10*3/uL — ABNORMAL HIGH (ref 1.7–7.7)
Neutrophils Relative %: 68 %
Platelets: 203 10*3/uL (ref 150–400)
RBC: 4.4 MIL/uL (ref 3.87–5.11)
RDW: 19.9 % — ABNORMAL HIGH (ref 11.5–15.5)
Smear Review: NORMAL
WBC: 57.6 10*3/uL (ref 4.0–10.5)
nRBC: 0.4 % — ABNORMAL HIGH (ref 0.0–0.2)

## 2021-11-28 LAB — COMPREHENSIVE METABOLIC PANEL
ALT: 30 U/L (ref 0–44)
AST: 26 U/L (ref 15–41)
Albumin: 3.7 g/dL (ref 3.5–5.0)
Alkaline Phosphatase: 80 U/L (ref 38–126)
Anion gap: 8 (ref 5–15)
BUN: 10 mg/dL (ref 6–20)
CO2: 28 mmol/L (ref 22–32)
Calcium: 8.9 mg/dL (ref 8.9–10.3)
Chloride: 101 mmol/L (ref 98–111)
Creatinine, Ser: 0.81 mg/dL (ref 0.44–1.00)
GFR, Estimated: >60 mL/min (ref >60)
Glucose, Bld: 111 mg/dL — ABNORMAL HIGH (ref 70–99)
Potassium: 3.9 mmol/L (ref 3.5–5.1)
Sodium: 137 mmol/L (ref 135–145)
Total Bilirubin: 0.7 mg/dL (ref 0.3–1.2)
Total Protein: 7.7 g/dL (ref 6.5–8.1)

## 2021-11-28 NOTE — Telephone Encounter (Signed)
Oral Chemotherapy Pharmacist Encounter   Reviewed patient's labs from today, CBC and CMP. Her WBC has decreased and her hgb/plts are stable. Potassium and SCr are wnl. Called Carol Ortega to review her labs from today. She was very excited about the new that her WBC had decreased.   She knows to continue her dasatinib 100mg . She will RTC next week for labs/NP/pharm clinic.  Darl Pikes, PharmD, BCPS, BCOP, CPP Hematology/Oncology Clinical Pharmacist [place]/DB/AP Oral [facility] [phone]  11/28/2021 12:55 PM

## 2021-11-30 ENCOUNTER — Other Ambulatory Visit: Payer: Self-pay

## 2021-11-30 ENCOUNTER — Encounter: Payer: Self-pay | Admitting: Urology

## 2021-11-30 ENCOUNTER — Other Ambulatory Visit: Payer: Self-pay | Admitting: *Deleted

## 2021-11-30 ENCOUNTER — Ambulatory Visit (INDEPENDENT_AMBULATORY_CARE_PROVIDER_SITE_OTHER): Payer: Medicaid Other | Admitting: Urology

## 2021-11-30 VITALS — BP 141/85 | HR 93 | Ht 66.0 in | Wt 314.0 lb

## 2021-11-30 DIAGNOSIS — Z9889 Other specified postprocedural states: Secondary | ICD-10-CM

## 2021-11-30 DIAGNOSIS — N200 Calculus of kidney: Secondary | ICD-10-CM

## 2021-11-30 DIAGNOSIS — D471 Chronic myeloproliferative disease: Secondary | ICD-10-CM

## 2021-11-30 DIAGNOSIS — C9210 Chronic myeloid leukemia, BCR/ABL-positive, not having achieved remission: Secondary | ICD-10-CM

## 2021-11-30 LAB — URINALYSIS, COMPLETE
Bilirubin, UA: NEGATIVE
Glucose, UA: NEGATIVE
Ketones, UA: NEGATIVE
Nitrite, UA: NEGATIVE
Specific Gravity, UA: 1.02 (ref 1.005–1.030)
Urobilinogen, Ur: 1 mg/dL (ref 0.2–1.0)
pH, UA: 6 (ref 5.0–7.5)

## 2021-11-30 LAB — MICROSCOPIC EXAMINATION: Epithelial Cells (non renal): >10 /hpf — ABNORMAL HIGH (ref 0–10)

## 2021-11-30 MED ORDER — LEVOFLOXACIN 500 MG PO TABS
500.0000 mg | ORAL_TABLET | Freq: Once | ORAL | Status: AC
  Administered 2021-11-30: 500 mg via ORAL

## 2021-11-30 NOTE — Progress Notes (Signed)
Indications: Patient is 50 y.o., with hyperparathyroidism/recurrent stone disease who is s/p ureteroscopic removal of a 10 mm left UPJ calculus 11/15/2021.  Mild mild stricture proximal ureter just distal to UPJ with impacted calculus and recommended indwelling stent x2 weeks the patient is presenting today for stent removal.  No postoperative complaints  Procedure:  Flexible Cystoscopy with stent removal (47092)  Timeout was performed and the correct patient, procedure and participants were identified.    Description:  The patient was prepped and draped in the usual sterile fashion. Flexible cystosopy was performed.  The stent was visualized, grasped, and removed intact without difficulty. The patient tolerated the procedure well.  A single dose of oral antibiotics was given.  Complications:  None  Plan:  Instructed to call for fever/flank pain post stent removal RUS 2 months Stone analysis: CaOxDi/hydroxyapatite 90/10   John Giovanni, MD

## 2021-12-01 ENCOUNTER — Emergency Department
Admission: EM | Admit: 2021-12-01 | Discharge: 2021-12-01 | Disposition: A | Discharge status: Home / Self Care | Payer: Medicaid Other | Attending: Emergency Medicine | Admitting: Emergency Medicine

## 2021-12-01 DIAGNOSIS — R112 Nausea with vomiting, unspecified: Secondary | ICD-10-CM | POA: Insufficient documentation

## 2021-12-01 DIAGNOSIS — R109 Unspecified abdominal pain: Secondary | ICD-10-CM | POA: Diagnosis not present

## 2021-12-01 DIAGNOSIS — R10A0 Flank pain, unspecified side: Secondary | ICD-10-CM

## 2021-12-01 DIAGNOSIS — Z91040 Latex allergy status: Secondary | ICD-10-CM | POA: Insufficient documentation

## 2021-12-01 DIAGNOSIS — Z86011 Personal history of benign neoplasm of the brain: Secondary | ICD-10-CM | POA: Diagnosis not present

## 2021-12-01 DIAGNOSIS — R1111 Vomiting without nausea: Secondary | ICD-10-CM | POA: Diagnosis not present

## 2021-12-01 DIAGNOSIS — J45909 Unspecified asthma, uncomplicated: Secondary | ICD-10-CM | POA: Diagnosis not present

## 2021-12-01 DIAGNOSIS — I10 Essential (primary) hypertension: Secondary | ICD-10-CM | POA: Diagnosis not present

## 2021-12-01 LAB — URINALYSIS, COMPLETE (UACMP) WITH MICROSCOPIC
Bilirubin Urine: NEGATIVE
Glucose, UA: NEGATIVE mg/dL
Ketones, ur: NEGATIVE mg/dL
Leukocytes,Ua: NEGATIVE
Nitrite: NEGATIVE
Protein, ur: >300 mg/dL — AB
Specific Gravity, Urine: >1.03 — ABNORMAL HIGH (ref 1.005–1.030)
pH: 5.5 (ref 5.0–8.0)

## 2021-12-01 MED ORDER — OXYCODONE-ACETAMINOPHEN 5-325 MG PO TABS: 1.0000 | ORAL_TABLET | ORAL | 0 refills | Status: DC | PRN

## 2021-12-01 MED ORDER — FENTANYL CITRATE PF 50 MCG/ML IJ SOSY
100.0000 ug | PREFILLED_SYRINGE | Freq: Once | INTRAMUSCULAR | Status: AC
  Administered 2021-12-01: 100 ug via INTRAVENOUS
  Filled 2021-12-01: qty 2

## 2021-12-01 MED ORDER — SODIUM CHLORIDE 0.9 % IV BOLUS
1000.0000 mL | Freq: Once | INTRAVENOUS | Status: AC
  Administered 2021-12-01: 1000 mL via INTRAVENOUS

## 2021-12-01 MED ORDER — KETOROLAC TROMETHAMINE 30 MG/ML IJ SOLN
30.0000 mg | Freq: Once | INTRAMUSCULAR | Status: AC
  Administered 2021-12-01: 30 mg via INTRAVENOUS
  Filled 2021-12-01: qty 1

## 2021-12-01 MED ORDER — ONDANSETRON HCL 4 MG/2ML IJ SOLN
4.0000 mg | Freq: Once | INTRAMUSCULAR | Status: AC
  Administered 2021-12-01: 4 mg via INTRAVENOUS
  Filled 2021-12-01: qty 2

## 2021-12-01 NOTE — ED Triage Notes (Signed)
Pt presents via EMS from home complaining of left sided flank pain. The patient had a stent removed from her left kidney yesterday and since then she has been experiencing significant pain with associated nausea & vomiting. Pt has tried prescribed pain meds without improvement.Pt denies SOB or CP.

## 2021-12-01 NOTE — Discharge Instructions (Addendum)
Please take your pain medication as needed but only as prescribed.  Do not drink alcohol or drive while taking this medication.  Please return to the emergency department for any severe pain, fever, or any other symptom personally concerning to yourself.  Otherwise please follow-up with your urologist regarding today's ER visit.

## 2021-12-01 NOTE — ED Provider Notes (Signed)
[facility] Emergency Department Provider Note  Time seen: 7:51 AM  I have reviewed the triage vital signs and the nursing notes.   HISTORY  Chief Complaint Flank Pain   HPI Carol Ortega is a 50 y.o. female with a past medical history of anxiety, CML, hypertension, prior blood clot, recent left kidney stone status post removal and ureteral stent presents to the emergency department with left flank pain since her stent was removed yesterday.  According to the patient she had a left-sided kidney stone, had a stone removal procedure as well as a ureteral stent placed approximately 2 weeks ago.  The stent was removed yesterday in the urology office.  Patient states since that time she has had severe left flank pain along with nausea and vomiting.  States she has tried taking home pain medication without relief so she came to the emergency department.  No fever.  No dysuria.   Past Medical History:  Diagnosis Date   Anemia    Anxiety    Asthma    seasonal   Benign brain tumor ([place])    Brain tumor ([place]) 2014   tx with radiation   CML (chronic myelocytic leukemia) ([place]) 62/06/349   Complication of anesthesia 1999   lung collapse after surgery for gallbladder   Depression    Ectopic pregnancy    Headache    migraines   Hoarseness of voice    Hypertension    Kidney stone    Obesity    Parathyroid abnormality (HCC)    Pulmonary emboli ([employer] [place]) 2011   Pulmonary embolism (HCC)    Radiation    Brain tumor   UTI (urinary tract infection)    Vitamin D deficiency     Patient Active Problem List   Diagnosis Date Noted   CML (chronic myelocytic leukemia) ([place]) 11/21/2021   Left flank pain 11/09/2021   Leukocytosis 11/08/2021   Myeloproliferative disorder (HCC)    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 02/23/2020   Menorrhagia with irregular cycle 10/30/2019   Personal history of benign brain tumor 11/06/2018   Insomnia 07/30/2018   Chronic pain of both  knees 07/30/2018   Morbid obesity due to excess calories ([place]) 05/09/2018   Depression, recurrent ([place]) 05/09/2018   GAD (generalized anxiety disorder) 05/09/2018   DVT (deep venous thrombosis) ([place]) 04/15/2018   Bladder spasms 03/12/2018   Ureteral stone with hydronephrosis 02/28/2018   Kidney stone 04/09/2017   H/O pulmonary embolus during pregnancy 08/28/2016   S/P laparoscopic cholecystectomy 08/28/2016   Hyperparathyroidism ([place]) 09/10/2015   Vitamin D deficiency 06/25/2014   Hypercalcemia 02/09/2014   Essential hypertension 01/30/2014   Hyperlipidemia 01/30/2014   Nephrolithiasis, 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   Adult body mass index 50.0-59.9 ([place]) 03/25/2013   Anxiety state 03/25/2013   Asthma 03/25/2013   Obstructive sleep apnea 03/25/2013   Personal history of venous thrombosis and embolism 03/25/2013   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   Brain tumor ([place]) 12/18/2012   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[place]) 10/30/2012    Past Surgical History:  Procedure Laterality Date   CHOLECYSTECTOMY  1999   CYSTOSCOPY W/ RETROGRADES Left 03/01/2018   Procedure: CYSTOSCOPY WITH RETROGRADE PYELOGRAM;  Surgeon: Abbie Sons, MD;  Location: [facility];  Service: Urology;  Laterality: Left;   CYSTOSCOPY W/ URETERAL STENT PLACEMENT Right 04/08/2017   Procedure: CYSTOSCOPY WITH RETROGRADE PYELOGRAM/URETERAL STENT PLACEMENT;  Surgeon: Rana Snare, MD;  Location: [facility];  Service: Urology;  Laterality: Right;   CYSTOSCOPY W/ URETERAL STENT  PLACEMENT Left 03/12/2018   Procedure: CYSTOSCOPY WITH STENT REPLACEMENT;  Surgeon: Abbie Sons, MD;  Location: [facility];  Service: Urology;  Laterality: Left;   CYSTOSCOPY W/ URETEROSCOPY     CYSTOSCOPY WITH RETROGRADE PYELOGRAM, URETEROSCOPY AND STENT PLACEMENT Left 01/20/2014   Procedure: LEFT URETEROSCOPY WITH STENT PLACEMENT;  Surgeon: Irine Seal, MD;  Location: [facility];  Service: Urology;  Laterality: Left;   CYSTOSCOPY WITH RETROGRADE PYELOGRAM, URETEROSCOPY  AND STENT PLACEMENT Bilateral 04/23/2015   Procedure: CYSTOSCOPY WITH BILATERAL RETROGRADE PYELOGRAM, URETEROSCOPY AND STENT PLACEMENT;  Surgeon: Alexis Frock, MD;  Location: [facility];  Service: Urology;  Laterality: Bilateral;   CYSTOSCOPY WITH RETROGRADE PYELOGRAM, URETEROSCOPY AND STENT PLACEMENT Right 04/13/2017   Procedure: CYSTOSCOPY WITH RETROGRADE PYELOGRAM, URETEROSCOPY AND STENT EXCHANGE;  Surgeon: Alexis Frock, MD;  [facility];  Service: Urology;  Laterality: Right;   CYSTOSCOPY WITH STENT PLACEMENT Left 01/12/2014   Procedure: CYSTOSCOPY WITH STENT PLACEMENT left retrograde;  Surgeon: Irine Seal, MD;  [facility]: [facility];  Service: Urology;  Laterality: Left;   CYSTOSCOPY WITH STENT PLACEMENT Left 03/01/2018   Procedure: CYSTOSCOPY WITH STENT PLACEMENT;  Surgeon: Abbie Sons, MD;  Location: [facility];  Service: Urology;  Laterality: Left;   CYSTOSCOPY/URETEROSCOPY/HOLMIUM LASER/STENT PLACEMENT Left 03/12/2018   Procedure: CYSTOSCOPY/URETEROSCOPY/HOLMIUM LASER;  Surgeon: Abbie Sons, MD;  Location: [facility];  Service: Urology;  Laterality: Left;   CYSTOSCOPY/URETEROSCOPY/HOLMIUM LASER/STENT PLACEMENT Left 11/15/2021   Procedure: CYSTOSCOPY/URETEROSCOPY/HOLMIUM LASER/STENT PLACEMENT;  Surgeon: Abbie Sons, MD;  Location: [facility];  Service: Urology;  Laterality: Left;   HOLMIUM LASER APPLICATION Left 02/15/5399   Procedure: HOLMIUM LASER APPLICATION;  Surgeon: Irine Seal, MD;  [facility];  Service: Urology;  Laterality: Left;   HOLMIUM LASER APPLICATION Bilateral 07/23/7618   Procedure: HOLMIUM LASER APPLICATION;  Surgeon: Alexis Frock, MD;  [facility];  Service: Urology;  Laterality: Bilateral;   HOLMIUM LASER APPLICATION Right 04/25/3266   Procedure: HOLMIUM LASER APPLICATION;  Surgeon: Alexis Frock, MD;  [facility];  Service: Urology;  Laterality: Right;   kidney stone removal     LITHOTRIPSY     PARATHYROIDECTOMY Right 11/13/2016   Procedure: RIGHT  SUPERIOR PARATHYROIDECTOMY;  Surgeon: Armandina Gemma, MD;  Location: [facility];  Service: General;  Laterality: Right;   surgery for,ectopic pregnancy  2011    1 fallopian tube rupture   TUBAL LIGATION  2011    Prior to Admission medications   Medication Sig Start Date End Date Taking? Authorizing Provider  albuterol (PROVENTIL HFA;VENTOLIN HFA) 108 (90 Base) MCG/ACT inhaler Inhale 2 puffs into the lungs every 6 (six) hours as needed for wheezing or shortness of breath. 03/04/18   Loletha Grayer, MD  albuterol (PROVENTIL) (2.5 MG/3ML) 0.083% nebulizer solution Take 3 mLs (2.5 mg total) by nebulization every 6 (six) hours as needed for wheezing or shortness of breath. 03/04/18   Loletha Grayer, MD  dasatinib (SPRYCEL) 100 MG tablet Take 1 tablet (100 mg total) by mouth daily. 11/16/21   Sindy Guadeloupe, MD  EPINEPHrine 0.3 mg/0.3 mL IJ SOAJ injection Inject into the muscle. Patient not taking: Reported on 11/21/2021 10/13/19   [provider]    Allergies  Allergen Reactions   Bee Venom Anaphylaxis   Contrast Media [Iodinated Diagnostic Agents] Anaphylaxis   Eggs Or Egg-Derived Products Anaphylaxis   Other Anaphylaxis    Surgical dye.. Pt states ok to take if previously taken benadryl or prednisone   Penicillins Hives and Other (See Comments)    Has  patient had a PCN reaction causing immediate rash, facial/tongue/throat swelling, SOB or lightheadedness with hypotension: No Has patient had a PCN reaction causing severe rash involving mucus membranes or skin necrosis: No Has patient had a PCN reaction that required hospitalization No Has patient had a PCN reaction occurring within the last 10 years: No If all of the above answers are "NO", then may proceed with Cephalosporin use.   Wellbutrin [Bupropion] Other (See Comments)    irritiability   Latex Rash   Oxycodone-Acetaminophen Itching    Family History  Problem Relation Age of Onset   Bell's palsy Mother    Heart failure Mother     Hypertension Mother    Lupus Mother    Anxiety disorder Mother    Depression Mother    Stroke Mother    Asthma Father    Hypertension Father    Hyperlipidemia Father    Anxiety disorder Father    Depression Father    Hypertension Maternal Grandmother    Diabetes Maternal Grandmother    Sarcoidosis Maternal Grandmother    Anxiety disorder Sister    Depression Sister    Bipolar disorder Sister    Depression Son    Bipolar disorder Son     Social History Social History   Tobacco Use   Smoking status: Never   Smokeless tobacco: Never  Vaping Use   Vaping Use: Never used  Substance Use Topics   Alcohol use: Not Currently   Drug use: Yes    Types: Marijuana    Review of Systems Constitutional: Negative for fever. Cardiovascular: Negative for chest pain. Respiratory: Negative for shortness of breath. Gastrointestinal: Left flank pain.  Positive for nausea and vomiting.  Negative for diarrhea. Genitourinary: Negative for urinary compaints Musculoskeletal: Negative for musculoskeletal complaints Neurological: Negative for headache All other ROS negative  ____________________________________________   PHYSICAL EXAM:  VITAL SIGNS: ED Triage Vitals  Enc Vitals Group     BP 12/01/21 0633 (!) 168/152     Pulse Rate 12/01/21 0633 87     Resp 12/01/21 0633 16     Temp 12/01/21 0633 99 F (37.2 C)     Temp Source 12/01/21 0633 Oral     SpO2 12/01/21 0633 98 %     Weight 12/01/21 0634 (!) 314 lb (142.4 kg)     Height 12/01/21 0634 5\' 6"  (1.676 m)     Head Circumference --      Peak Flow --      Pain Score 12/01/21 0634 10     Pain Loc --      Pain Edu? --      Excl. in [place]? --    Constitutional: Alert and oriented. Well appearing and in no distress. Eyes: Normal exam ENT      Head: Normocephalic and atraumatic.      Mouth/Throat: Mucous membranes are moist. Cardiovascular: Normal rate, regular rhythm.  Respiratory: Normal respiratory effort without tachypnea  nor retractions. Breath sounds are clear Gastrointestinal: Soft and nontender. No distention. Musculoskeletal: Nontender with normal range of motion in all extremities.  Neurologic:  Normal speech and language. No gross focal neurologic deficits Skin:  Skin is warm, dry and intact.  Psychiatric: Mood and affect are normal.   ____________________________________________    INITIAL IMPRESSION / ASSESSMENT AND PLAN / ED COURSE  Pertinent labs & imaging results that were available during my care of the patient were reviewed by me and considered in my medical decision making (see chart for details).  Patient presents to the emergency department for left flank pain.  Patient's ureteral stent was removed yesterday in the office which is when the pain began along with nausea and vomiting.  Has tried home pain medication but had trouble keeping anything down so she came to the emergency department.  Here patient continues to state moderate to severe left flank pain along with nausea.  Highly suspect pain related to ureteral stent removal as symptoms started nearly immediately after the stent was removed per patient.  We will treat with pain and nausea medication, IV fluids and obtain a urine sample as a precaution.  Patient agreeable to plan of care.  Patient states her pain is nearly gone at this time feels much better.  Urinalysis does not appear to show any concerning findings a urine culture has been sent as a precaution.  We will discharge patient with a short course of pain medication have her follow-up with her urologist.  Patient agreeable to plan.  Carol Ortega was evaluated in Emergency Department on 12/01/2021 for the symptoms described in the history of present illness. She was evaluated in the context of the global COVID-19 pandemic, which necessitated consideration that the patient might be at risk for infection with the SARS-CoV-2 virus that causes COVID-19. Institutional protocols and  algorithms that pertain to the evaluation of patients at risk for COVID-19 are in a state of rapid change based on information released by regulatory bodies including the CDC and federal and state organizations. These policies and algorithms were followed during the patient's care in the ED.  ____________________________________________   FINAL CLINICAL IMPRESSION(S) / ED DIAGNOSES  Left flank pain   Harvest Dark, MD 12/01/21 [phone]

## 2021-12-02 LAB — URINE CULTURE: Culture: NO GROWTH

## 2021-12-05 NOTE — Progress Notes (Signed)
[facility]  Telephone:([phone] [phone]  Patient Care Team: Berkley Harvey, NP as PCP - General (Nurse Practitioner)   Name of the patient: Carol Ortega  [id number]  1971/02/17   Date of visit: 12/06/21  HPI: Patient is a 50 y.o. female with newly diagnosed CML. She was started on treatment with Sprycel (dasatinib) on 11/17/21.  Reason for Consult: Oral chemotherapy follow-up for dasatinib therapy.   PAST MEDICAL HISTORY: Past Medical History:  Diagnosis Date   Anemia    Anxiety    Asthma    seasonal   Benign brain tumor ([place])    Brain tumor ([place]) 2014   tx with radiation   CML (chronic myelocytic leukemia) ([place]) 51/06/6159   Complication of anesthesia 1999   lung collapse after surgery for gallbladder   Depression    Ectopic pregnancy    Headache    migraines   Hoarseness of voice    Hypertension    Kidney stone    Obesity    Parathyroid abnormality (HCC)    Pulmonary emboli ([place]) 2011   Pulmonary embolism (HCC)    Radiation    Brain tumor   UTI (urinary tract infection)    Vitamin D deficiency     HEMATOLOGY/ONCOLOGY HISTORY:  Oncology History  CML (chronic myelocytic leukemia) ([place])  11/21/2021 Initial Diagnosis   CML (chronic myelocytic leukemia) ([place])   11/21/2021 Cancer Staging   Staging form: Chronic Myeloid Leukemia, AJCC 8th Edition - Clinical stage from 11/21/2021: Ph+ - Signed by Sindy Guadeloupe, MD on 11/21/2021 Stage prefix: Initial diagnosis      ALLERGIES:  is allergic to bee venom, contrast media [iodinated diagnostic agents], eggs or egg-derived products, other, penicillins, wellbutrin [bupropion], latex, and oxycodone-acetaminophen.  MEDICATIONS:  Current Outpatient Medications  Medication Sig Dispense Refill   prochlorperazine (COMPAZINE) 10 MG tablet Take 1 tablet (10 mg total) by mouth every 6 (six) hours as needed for nausea or vomiting. 30 tablet 0   albuterol (PROVENTIL  HFA;VENTOLIN HFA) 108 (90 Base) MCG/ACT inhaler Inhale 2 puffs into the lungs every 6 (six) hours as needed for wheezing or shortness of breath. 1 Inhaler 2   albuterol (PROVENTIL) (2.5 MG/3ML) 0.083% nebulizer solution Take 3 mLs (2.5 mg total) by nebulization every 6 (six) hours as needed for wheezing or shortness of breath. 75 mL 12   dasatinib (SPRYCEL) 100 MG tablet Take 1 tablet (100 mg total) by mouth daily. 30 tablet 0   EPINEPHrine 0.3 mg/0.3 mL IJ SOAJ injection Inject into the muscle. (Patient not taking: Reported on 11/21/2021)     No current facility-administered medications for this visit.    VITAL SIGNS: There were no vitals taken for this visit. There were no vitals filed for this visit.  Estimated body mass index is 50.04 kg/m as calculated from the following:   Height as of an earlier encounter on 12/06/21: 5\' 6"  (1.676 m).   Weight as of an earlier encounter on 12/06/21: 140.6 kg (310 lb).  LABS: CBC:    Component Value Date/Time   WBC 11.3 (H) 12/06/2021 1434   HGB 11.1 (L) 12/06/2021 1434   HGB 11.9 11/08/2021 1744   HCT 36.1 12/06/2021 1434   HCT 41.2 09/26/2018 1612   PLT 147 (L) 12/06/2021 1434   PLT 377 09/26/2018 1612   MCV 86.6 12/06/2021 1434   MCV 84 09/26/2018 1612   NEUTROABS 7.0 12/06/2021 1434   NEUTROABS 3.4 09/26/2018 1612   LYMPHSABS 2.2  12/06/2021 1434   LYMPHSABS 3.0 09/26/2018 1612   MONOABS 1.4 (H) 12/06/2021 1434   EOSABS 0.2 12/06/2021 1434   EOSABS 0.3 09/26/2018 1612   BASOSABS 0.2 (H) 12/06/2021 1434   BASOSABS 0.1 09/26/2018 1612   Comprehensive Metabolic Panel:    Component Value Date/Time   NA 137 12/06/2021 1434   NA 142 09/26/2018 1612   K 3.4 (L) 12/06/2021 1434   CL 102 12/06/2021 1434   CO2 29 12/06/2021 1434   BUN 9 12/06/2021 1434   BUN 9 09/26/2018 1612   CREATININE 0.99 12/06/2021 1434   GLUCOSE 111 (H) 12/06/2021 1434   CALCIUM 8.7 (L) 12/06/2021 1434   AST 23 12/06/2021 1434   ALT 24 12/06/2021 1434    ALKPHOS 77 12/06/2021 1434   BILITOT 0.5 12/06/2021 1434   BILITOT 0.3 09/26/2018 1612   PROT 7.6 12/06/2021 1434   PROT 7.7 09/26/2018 1612   ALBUMIN 3.7 12/06/2021 1434   ALBUMIN 4.4 09/26/2018 1612     Present during today's visit: patient only  Assessment and Plan: Reviewed CBC/CMP with patient, continue dasatinib 100mg  daily   Oral Chemotherapy Side Effect/Intolerance:  Nausea: Occasional nausea, associated with the smell of food. Patient prescribed prochlorperazine to use as needed. Decrease appetite: Patient has noticed a loss in weight due to her decrease in appetite. She also noted some reflux that has been bothering her. Discussed the use to famotidine to manage this, there is a DDI with dasatinib, but this can be managed by taking the famotidine at least 2 hours after the dasatinib. Patient stated her understanding of the need to separate the two medications No reported edema or diarrhea  Oral Chemotherapy Adherence: No missed doses reported No patient barriers to medication adherence identified.   New medications: None reported  Medication Access Issues: no issues, patient fills at [facility]  Patient expressed understanding and was in agreement with this plan. She also understands that She can call clinic at any time with any questions, concerns, or complaints.   Follow-up plan: RTC in 1 week for labs and 2 weeks for labs/MD  Thank you for allowing me to participate in the care of this very pleasant patient.   Time Total: 70mins  Visit consisted of counseling and education on dealing with issues of symptom management in the setting of serious and potentially life-threatening illness.Greater than 50%  of this time was spent counseling and coordinating care related to the above assessment and plan.  Signed by: Darl Pikes, PharmD, BCPS, Salley Slaughter, CPP Hematology/Oncology Clinical Pharmacist Practitioner [place]/DB/AP [facility] [phone]  12/06/2021 4:45 PM

## 2021-12-06 ENCOUNTER — Inpatient Hospital Stay: Payer: Medicaid Other | Admitting: Pharmacist

## 2021-12-06 ENCOUNTER — Other Ambulatory Visit: Payer: Self-pay | Admitting: Radiology

## 2021-12-06 ENCOUNTER — Encounter: Payer: Self-pay | Admitting: Oncology

## 2021-12-06 ENCOUNTER — Inpatient Hospital Stay (HOSPITAL_BASED_OUTPATIENT_CLINIC_OR_DEPARTMENT_OTHER): Payer: Medicaid Other | Admitting: Oncology

## 2021-12-06 ENCOUNTER — Inpatient Hospital Stay: Payer: Medicaid Other

## 2021-12-06 ENCOUNTER — Other Ambulatory Visit: Payer: Self-pay

## 2021-12-06 VITALS — BP 143/94 | HR 77 | Temp 98.4°F | Resp 18 | Ht 66.0 in | Wt 310.0 lb

## 2021-12-06 DIAGNOSIS — C9210 Chronic myeloid leukemia, BCR/ABL-positive, not having achieved remission: Secondary | ICD-10-CM | POA: Diagnosis not present

## 2021-12-06 DIAGNOSIS — Z79899 Other long term (current) drug therapy: Secondary | ICD-10-CM | POA: Diagnosis not present

## 2021-12-06 LAB — COMPREHENSIVE METABOLIC PANEL
ALT: 24 U/L (ref 0–44)
AST: 23 U/L (ref 15–41)
Albumin: 3.7 g/dL (ref 3.5–5.0)
Alkaline Phosphatase: 77 U/L (ref 38–126)
Anion gap: 6 (ref 5–15)
BUN: 9 mg/dL (ref 6–20)
CO2: 29 mmol/L (ref 22–32)
Calcium: 8.7 mg/dL — ABNORMAL LOW (ref 8.9–10.3)
Chloride: 102 mmol/L (ref 98–111)
Creatinine, Ser: 0.99 mg/dL (ref 0.44–1.00)
GFR, Estimated: >60 mL/min (ref >60)
Glucose, Bld: 111 mg/dL — ABNORMAL HIGH (ref 70–99)
Potassium: 3.4 mmol/L — ABNORMAL LOW (ref 3.5–5.1)
Sodium: 137 mmol/L (ref 135–145)
Total Bilirubin: 0.5 mg/dL (ref 0.3–1.2)
Total Protein: 7.6 g/dL (ref 6.5–8.1)

## 2021-12-06 LAB — CBC WITH DIFFERENTIAL/PLATELET
Abs Immature Granulocytes: 0.28 10*3/uL — ABNORMAL HIGH (ref 0.00–0.07)
Basophils Absolute: 0.2 10*3/uL — ABNORMAL HIGH (ref 0.0–0.1)
Basophils Relative: 2 %
Eosinophils Absolute: 0.2 10*3/uL (ref 0.0–0.5)
Eosinophils Relative: 2 %
HCT: 36.1 % (ref 36.0–46.0)
Hemoglobin: 11.1 g/dL — ABNORMAL LOW (ref 12.0–15.0)
Immature Granulocytes: 3 %
Lymphocytes Relative: 20 %
Lymphs Abs: 2.2 10*3/uL (ref 0.7–4.0)
MCH: 26.6 pg (ref 26.0–34.0)
MCHC: 30.7 g/dL (ref 30.0–36.0)
MCV: 86.6 fL (ref 80.0–100.0)
Monocytes Absolute: 1.4 10*3/uL — ABNORMAL HIGH (ref 0.1–1.0)
Monocytes Relative: 12 %
Neutro Abs: 7 10*3/uL (ref 1.7–7.7)
Neutrophils Relative %: 61 %
Platelets: 147 10*3/uL — ABNORMAL LOW (ref 150–400)
RBC: 4.17 MIL/uL (ref 3.87–5.11)
RDW: 19.7 % — ABNORMAL HIGH (ref 11.5–15.5)
WBC: 11.3 10*3/uL — ABNORMAL HIGH (ref 4.0–10.5)
nRBC: 0.3 % — ABNORMAL HIGH (ref 0.0–0.2)

## 2021-12-06 MED ORDER — DASATINIB 100 MG PO TABS
100.0000 mg | ORAL_TABLET | Freq: Every day | ORAL | 0 refills | Status: DC
  Filled 2021-12-06 – 2021-12-08 (×2): qty 30, 30d supply, fill #0

## 2021-12-06 MED ORDER — PROCHLORPERAZINE MALEATE 10 MG PO TABS: 10.0000 mg | ORAL_TABLET | Freq: Four times a day (QID) | ORAL | 0 refills | Status: DC | PRN

## 2021-12-06 NOTE — Progress Notes (Signed)
Hematology/Oncology Consult note [facility]  Telephone:([phone] [phone]  Patient Care Team: Berkley Harvey, NP as PCP - General (Nurse Practitioner)   Name of the patient: Carol Ortega  [id number]  1971-06-28   Date of visit: 12/06/21  Diagnosis-chronic phase CML  Chief complaint/ Reason for visit-discuss results of blood work and further management  Heme/Onc history: patient is a 50 year old African-American femaleWho presents to the ER with symptoms of left-sided flank pain.  CT abdomen and pelvis without contrast showed moderate left hydronephrosis secondary to a 1.1 x 0.9 x 1.3 cm calculus at the left ureteropelvic junction/proximal left ureter.  No other evidence of adenopathy and spleen appeared normal.  Incidentally patient was noted to have a white cell count of 138 which on repeat check was 133.  Platelet counts normal and hemoglobin was 12.1.  Last normal CBC was in April 2021 when white count was 7.2.  Presently differential mainly shows neutrophilia along with lymphocytosis monocytosis basophilia and eosinophilia.  Immature granulocytes were seen.  Pathology smear review shows findings concerning for myeloproliferative neoplasm.  Blasts possibly less than 20% and BCR ABL testing was recommended.Patient noted to have wbc of 18 in may 2022 again with predominant left shift.  BCR ABL FISH testing showed 100% nuclei positive for BCR-ABL gene fusion signals.  BCR ABL PCR testing showed B2 A2 transcript 847.313 and B3 A2 transcript less than 0.0032%.  E1 A2 transcript 0.5645%.  Peripheral blood flow cytometry showedAberrant myeloblast population about 2% of the leukocytes.  Absolute neutrophilia eosinophilia and basophilia.  Interval history-reports compliance with her Dasatinib.  Reports nausea without vomiting and significant decrease in her appetite.  Her weight is down 8 pounds today.  She states she would like to try some supplements such  as Ensure or boost.  She will be meeting with Thayer Dallas our oral chemotherapy pharmacist today as well.  ECOG PS- 1 Pain scale- 0   Review of systems- Review of Systems  Constitutional:  Positive for malaise/fatigue. Negative for chills, fever and weight loss.  HENT:  Negative for congestion, ear pain and tinnitus.   Eyes: Negative.  Negative for blurred vision and double vision.  Respiratory: Negative.  Negative for cough, sputum production and shortness of breath.   Cardiovascular: Negative.  Negative for chest pain, palpitations and leg swelling.  Gastrointestinal:  Positive for nausea. Negative for abdominal pain, constipation, diarrhea and vomiting.  Genitourinary:  Negative for dysuria, frequency and urgency.  Musculoskeletal:  Negative for back pain and falls.  Skin: Negative.  Negative for rash.  Neurological: Negative.  Negative for weakness and headaches.  Endo/Heme/Allergies: Negative.  Does not bruise/bleed easily.  Psychiatric/Behavioral: Negative.  Negative for depression. The patient is not nervous/anxious and does not have insomnia.      Allergies  Allergen Reactions   Bee Venom Anaphylaxis   Contrast Media [Iodinated Diagnostic Agents] Anaphylaxis   Eggs Or Egg-Derived Products Anaphylaxis   Other Anaphylaxis    Surgical dye.. Pt states ok to take if previously taken benadryl or prednisone   Penicillins Hives and Other (See Comments)    Has patient had a PCN reaction causing immediate rash, facial/tongue/throat swelling, SOB or lightheadedness with hypotension: No Has patient had a PCN reaction causing severe rash involving mucus membranes or skin necrosis: No Has patient had a PCN reaction that required hospitalization No Has patient had a PCN reaction occurring within the last 10 years: No If all of the above answers are "NO",  then may proceed with Cephalosporin use.   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[facility])    Pulmonary emboli ([place]) 2011   Pulmonary embolism ([facility])    Radiation    Brain tumor   UTI (urinary tract infection)    Vitamin D deficiency      Past Surgical History:  Procedure Laterality Date   CHOLECYSTECTOMY  1999   CYSTOSCOPY W/ RETROGRADES Left 03/01/2018   Procedure: CYSTOSCOPY WITH RETROGRADE PYELOGRAM;  Surgeon: Abbie Sons, MD;  Location: [facility];  Service: Urology;  Laterality: Left;   CYSTOSCOPY W/ URETERAL STENT PLACEMENT Right 04/08/2017   Procedure: CYSTOSCOPY WITH RETROGRADE PYELOGRAM/URETERAL STENT PLACEMENT;  Surgeon: Rana Snare, MD;  Location: [facility];  Service: Urology;  Laterality: Right;   CYSTOSCOPY W/ URETERAL STENT PLACEMENT Left 03/12/2018   Procedure: CYSTOSCOPY WITH STENT REPLACEMENT;  Surgeon: Abbie Sons, MD;  Location: [facility];  Service: Urology;  Laterality: Left;   CYSTOSCOPY W/ URETEROSCOPY     CYSTOSCOPY WITH RETROGRADE PYELOGRAM, URETEROSCOPY AND STENT PLACEMENT Left 01/20/2014   Procedure: LEFT URETEROSCOPY WITH STENT PLACEMENT;  Surgeon: Irine Seal, MD;  Location: [facility];  Service: Urology;  Laterality: Left;   CYSTOSCOPY WITH RETROGRADE PYELOGRAM, URETEROSCOPY AND STENT PLACEMENT Bilateral 04/23/2015   Procedure: CYSTOSCOPY WITH BILATERAL RETROGRADE PYELOGRAM, URETEROSCOPY AND STENT PLACEMENT;  Surgeon: Alexis Frock, MD;  Location: [facility];  Service: Urology;  Laterality: Bilateral;   CYSTOSCOPY WITH RETROGRADE PYELOGRAM, URETEROSCOPY AND STENT  PLACEMENT Right 04/13/2017   Procedure: CYSTOSCOPY WITH RETROGRADE PYELOGRAM, URETEROSCOPY AND STENT EXCHANGE;  Surgeon: Alexis Frock, MD;  Location: [facility];  Service: Urology;  Laterality: Right;   CYSTOSCOPY WITH STENT PLACEMENT Left 01/12/2014   Procedure: CYSTOSCOPY WITH STENT PLACEMENT left retrograde;  Surgeon: Irine Seal, MD;  Location: [facility];  Service: Urology;  Laterality: Left;   CYSTOSCOPY WITH STENT PLACEMENT Left 03/01/2018   Procedure: CYSTOSCOPY WITH STENT PLACEMENT;  Surgeon: Abbie Sons, MD;  Location: [facility];  Service: Urology;  Laterality: Left;   CYSTOSCOPY/URETEROSCOPY/HOLMIUM LASER/STENT PLACEMENT Left 03/12/2018   Procedure: CYSTOSCOPY/URETEROSCOPY/HOLMIUM LASER;  Surgeon: Abbie Sons, MD;  Location: [facility];  Service: Urology;  Laterality: Left;   CYSTOSCOPY/URETEROSCOPY/HOLMIUM LASER/STENT PLACEMENT Left 11/15/2021   Procedure: CYSTOSCOPY/URETEROSCOPY/HOLMIUM LASER/STENT PLACEMENT;  Surgeon: Abbie Sons, MD;  Location: [facility];  Service: Urology;  Laterality: Left;   HOLMIUM LASER APPLICATION Left 02/20/4655   Procedure: HOLMIUM LASER APPLICATION;  Surgeon: Irine Seal, MD;  Location: [facility];  Service: Urology;  Laterality: Left;   HOLMIUM LASER APPLICATION Bilateral 07/18/2750   Procedure: HOLMIUM LASER APPLICATION;  Surgeon: Alexis Frock, MD;  Location: [facility];  Service: Urology;  Laterality: Bilateral;   HOLMIUM LASER APPLICATION Right 7/00/1749   Procedure: HOLMIUM LASER APPLICATION;  Surgeon: Alexis Frock, MD;  Location: [facility];  Service: Urology;  Laterality: Right;  kidney stone removal     LITHOTRIPSY     PARATHYROIDECTOMY Right 11/13/2016   Procedure: RIGHT SUPERIOR PARATHYROIDECTOMY;  Surgeon: Armandina Gemma, MD;  Location: [facility];  Service: General;  Laterality: Right;   surgery for,ectopic pregnancy  2011    1 fallopian tube rupture   TUBAL LIGATION  2011    Social History   Socioeconomic History   Marital status: Divorced    Spouse  name: Not on file   Number of children: 4   Years of education: Not on file   Highest education level: Some college, no degree  Occupational History   Not on file  Tobacco Use   Smoking status: Never   Smokeless tobacco: Never  Vaping Use   Vaping Use: Never used  Substance and Sexual Activity   Alcohol use: Not Currently   Drug use: Yes    Types: Marijuana   Sexual activity: Not Currently  Other Topics Concern   Not on file  Social History Narrative   Lives at home with her son, independent at baselinepend   Social Determinants of Health   Financial Resource Strain: Not on file  Food Insecurity: Not on file  Transportation Needs: Not on file  Physical Activity: Not on file  Stress: Not on file  Social Connections: Not on file  Intimate Partner Violence: Not on file    Family History  Problem Relation Age of Onset   Bell's palsy Mother    Heart failure Mother    Hypertension Mother    Lupus Mother    Anxiety disorder Mother    Depression Mother    Stroke Mother    Asthma Father    Hypertension Father    Hyperlipidemia Father    Anxiety disorder Father    Depression Father    Hypertension Maternal Grandmother    Diabetes Maternal Grandmother    Sarcoidosis Maternal Grandmother    Anxiety disorder Sister    Depression Sister    Bipolar disorder Sister    Depression Son    Bipolar disorder Son      Current Outpatient Medications:    albuterol (PROVENTIL HFA;VENTOLIN HFA) 108 (90 Base) MCG/ACT inhaler, Inhale 2 puffs into the lungs every 6 (six) hours as needed for wheezing or shortness of breath., Disp: 1 Inhaler, Rfl: 2   albuterol (PROVENTIL) (2.5 MG/3ML) 0.083% nebulizer solution, Take 3 mLs (2.5 mg total) by nebulization every 6 (six) hours as needed for wheezing or shortness of breath., Disp: 75 mL, Rfl: 12   dasatinib (SPRYCEL) 100 MG tablet, Take 1 tablet (100 mg total) by mouth daily., Disp: 30 tablet, Rfl: 0   EPINEPHrine 0.3 mg/0.3 mL IJ SOAJ  injection, Inject into the muscle. (Patient not taking: Reported on 11/21/2021), Disp: , Rfl:    prochlorperazine (COMPAZINE) 10 MG tablet, Take 1 tablet (10 mg total) by mouth every 6 (six) hours as needed for nausea or vomiting., Disp: 30 tablet, Rfl: 0  Physical exam:  Vitals:   12/06/21 1443  BP: (!) 143/94  Pulse: 77  Resp: 18  Temp: 98.4 F (36.9 C)  TempSrc: Tympanic  SpO2: 100%  Weight: (!) 310 lb (140.6 kg)  Height: _0  (1.676 m)   Physical Exam Constitutional:      Appearance: Normal appearance.  HENT:     Head: Normocephalic and atraumatic.  Eyes:     Pupils: Pupils are equal, round, and reactive to light.  Cardiovascular:     Rate and Rhythm: Normal rate and regular  rhythm.     Heart sounds: Normal heart sounds. No murmur heard. Pulmonary:     Effort: Pulmonary effort is normal.     Breath sounds: Normal breath sounds. No wheezing.  Abdominal:     General: Bowel sounds are normal. There is no distension.     Palpations: Abdomen is soft.     Tenderness: There is no abdominal tenderness.  Musculoskeletal:        General: Normal range of motion.     Cervical back: Normal range of motion.  Skin:    General: Skin is warm and dry.     Findings: No rash.  Neurological:     Mental Status: She is alert and oriented to person, place, and time.  Psychiatric:        Judgment: Judgment normal.     CMP Latest Ref Rng & Units 12/06/2021  Glucose 70 - 99 mg/dL 111(H)  BUN 6 - 20 mg/dL 9  Creatinine 0.44 - 1.00 mg/dL 0.99  Sodium 135 - 145 mmol/L 137  Potassium 3.5 - 5.1 mmol/L 3.4(L)  Chloride 98 - 111 mmol/L 102  CO2 22 - 32 mmol/L 29  Calcium 8.9 - 10.3 mg/dL 8.7(L)  Total Protein 6.5 - 8.1 g/dL 7.6  Total Bilirubin 0.3 - 1.2 mg/dL 0.5  Alkaline Phos 38 - 126 U/L 77  AST 15 - 41 U/L 23  ALT 0 - 44 U/L 24   CBC Latest Ref Rng & Units 12/06/2021  WBC 4.0 - 10.5 K/uL 11.3(H)  Hemoglobin 12.0 - 15.0 g/dL 11.1(L)  Hematocrit 36.0 - 46.0 % 36.1  Platelets 150  - 400 K/uL 147(L)    No images are attached to the encounter.  CT Abdomen Pelvis Wo Contrast  Result Date: 11/08/2021 CLINICAL DATA:  Left flank pain, kidney stone suspected. EXAM: CT ABDOMEN AND PELVIS WITHOUT CONTRAST TECHNIQUE: Multidetector CT imaging of the abdomen and pelvis was performed following the standard protocol without IV contrast. COMPARISON:  CT examination dated March 16, 2020 FINDINGS: Lower chest: No acute abnormality. Hepatobiliary: No focal liver abnormality is seen. Status post cholecystectomy. No biliary dilatation. Pancreas: Unremarkable. No pancreatic ductal dilatation or surrounding inflammatory changes. Spleen: Normal in size without focal abnormality. Adrenals/Urinary Tract: Adrenal glands are unremarkable. Moderate left hydronephrosis secondary to a 1.1 x 0.9 x 1.3 cm calculus at the left ureteropelvic junction/proximal left ureter. Mild left perinephric fat stranding. There is another punctate nonobstructing calculus in the upper pole of the left kidney. Simple cysts in the lower pole of the right kidney. No right hydroureteronephrosis. Right kidney and ureter bladder is unremarkable. Stomach/Bowel: Stomach is within normal limits. Appendix appears normal. No evidence of bowel wall thickening, distention, or inflammatory changes. Vascular/Lymphatic: No significant vascular findings are present. No enlarged abdominal or pelvic lymph nodes. Reproductive: Uterus and bilateral adnexa are unremarkable. Other: No abdominal wall hernia or abnormality. No abdominopelvic ascites. Musculoskeletal: No acute or significant osseous findings. Mild multilevel degenerative disc disease of the lumbar spine. IMPRESSION: Moderate left hydronephrosis secondary to a 1.1 x 0.9 x 1.3 cm calculus at the left ureteropelvic junction/proximal left ureter. Electronically Signed   By: Keane Police D.O.   On: 11/08/2021 11:52   DG Chest Port 1 View  Result Date: 11/08/2021 CLINICAL DATA:  Leukocytosis.   Low back and left flank pain. EXAM: PORTABLE CHEST 1 VIEW COMPARISON:  None. FINDINGS: The heart size and mediastinal contours are within normal limits. Low lung volumes without evidence of focal consolidation or pleural effusion. The  visualized skeletal structures are unremarkable. IMPRESSION: No active disease. Electronically Signed   By: Keane Police D.O.   On: 11/08/2021 17:22   DG OR UROLOGY CYSTO IMAGE ([place])  Result Date: 11/15/2021 There is no interpretation for this exam.  This order is for images obtained during a surgical procedure.  Please See "Surgeries" Tab for more information regarding the procedure.     Assessment and plan- Patient is a 50 y.o. female with newly diagnosed chronic phase CML who was recently started on Dasatinib 100 mg daily.  Clinically she is doing well.  She has been compliant with her Dasatinib.  She has been having nausea without vomiting and decrease in her appetite since starting the medication.  She has lost about 8 pounds in 1 week.  Lab work from today shows improvement of her white blood cell count to 11.3 (57.6) and an unremarkable differential.  Mild anemia hemoglobin 11.1 secondary to Dasatinib.  She is scheduled for a bone marrow biopsy on 12/08/2021.  Plan is to continue weekly CBC and she will see Dr. Janese Banks back on 12/23/2021.  Nausea-spoke with Ebony Hail who recommends Zofran and Compazine prior to meals and administration of the medication.  She also recommends Pepcid 2 hours before or after Dasatinib.   Anorexia-provided her with several supplements to try prior to buying.  Recommend she try Gatorade and propel as well.  Disposition- Continue Dasatinib 100 mg daily. Continue weekly CBC. Bone marrow on 12/08/2021. RTC as scheduled to see Dr. Janese Banks back on 12/23/2021.  I spent 25 minutes dedicated to the care of this patient (face-to-face and non-face-to-face) on the date of the encounter to include what is described in the assessment and  plan.    Cancer Staging  CML (chronic myelocytic leukemia) ([place]) Staging form: Chronic Myeloid Leukemia, AJCC 8th Edition - Clinical stage from 11/21/2021: Ph+ - Signed by Sindy Guadeloupe, MD on 11/21/2021 Stage prefix: Initial diagnosis     Visit Diagnosis 1. CML (chronic myelocytic leukemia) ([place])     Faythe Casa, NP 12/06/2021 3:39 PM

## 2021-12-07 ENCOUNTER — Other Ambulatory Visit: Payer: Self-pay | Admitting: Radiology

## 2021-12-07 ENCOUNTER — Other Ambulatory Visit (HOSPITAL_COMMUNITY): Payer: Self-pay

## 2021-12-08 ENCOUNTER — Ambulatory Visit: Admission: RE | Admit: 2021-12-08 | Payer: Medicaid Other | Source: Ambulatory Visit

## 2021-12-08 ENCOUNTER — Other Ambulatory Visit (HOSPITAL_COMMUNITY): Payer: Self-pay

## 2021-12-13 ENCOUNTER — Telehealth: Payer: Self-pay | Admitting: Oncology

## 2021-12-13 ENCOUNTER — Inpatient Hospital Stay: Payer: Medicaid Other

## 2021-12-13 ENCOUNTER — Other Ambulatory Visit (HOSPITAL_COMMUNITY): Payer: Self-pay

## 2021-12-13 NOTE — Telephone Encounter (Signed)
Thank you Carol Ortega!  Carol Ortega, got her weekly CBC cancelled for today. She returns next Friday for labs/MD.

## 2021-12-13 NOTE — Telephone Encounter (Signed)
Pt called to reschedule her lab appt for today. She is out of town, call back at [phone]

## 2021-12-14 ENCOUNTER — Telehealth: Payer: Self-pay | Admitting: Oncology

## 2021-12-14 NOTE — Telephone Encounter (Signed)
Oral Chemotherapy Pharmacist Encounter   Ms. Carol Ortega returned my call. She was interested in taking Dayquil/Nyqil for her cold. Reviewed with dasatinib and there is no DDI.   Darl Pikes, PharmD, BCPS, BCOP, CPP Hematology/Oncology Clinical Pharmacist [place]/DB/AP Oral [facility] [phone]  12/14/2021 1:38 PM

## 2021-12-14 NOTE — Telephone Encounter (Signed)
Patient called to ask what "cold medicines" she can take with [facility].   Please advice,   Thanks!  Anderson Malta

## 2021-12-16 ENCOUNTER — Inpatient Hospital Stay: Payer: Medicaid Other

## 2021-12-16 ENCOUNTER — Inpatient Hospital Stay (HOSPITAL_BASED_OUTPATIENT_CLINIC_OR_DEPARTMENT_OTHER): Payer: Medicaid Other | Admitting: Nurse Practitioner

## 2021-12-16 DIAGNOSIS — U071 COVID-19: Secondary | ICD-10-CM | POA: Diagnosis not present

## 2021-12-16 DIAGNOSIS — C9210 Chronic myeloid leukemia, BCR/ABL-positive, not having achieved remission: Secondary | ICD-10-CM | POA: Diagnosis not present

## 2021-12-16 MED ORDER — NIRMATRELVIR/RITONAVIR (PAXLOVID)TABLET: 3.0000 | ORAL_TABLET | Freq: Two times a day (BID) | ORAL | 0 refills | Status: AC

## 2021-12-16 NOTE — Progress Notes (Signed)
Pt requesting video visit for Covid symptoms

## 2021-12-16 NOTE — Progress Notes (Signed)
Virtual Visit Progress Note  [facility]pt[facility] [place] [facility] [place]. [facility] [address], [place], [place] [address] [phone] (phone) [phone] (fax)  I connected with Teretha Chalupa on 12/16/21 at 11:15 AM EST by video enabled telemedicine visit and verified that I am speaking with the correct person using two identifiers.   I discussed the limitations, risks, security and privacy concerns of performing an evaluation and management service by telemedicine and the availability of in-person appointments. I also discussed with the patient that there may be a patient responsible charge related to this service. The patient expressed understanding and agreed to proceed.   Other persons participating in the visit and their role in the encounter: none   Patients location: home  Providers location: clinic     Patient Care Team: Berkley Harvey, NP as PCP - General (Nurse Practitioner)   Name of the patient: Carol Ortega  [id number]  1971/11/17   Date of visit: 12/16/21  Diagnosis- CML  Chief complaint/ Reason for visit- Covid Positive  Heme/Onc history:  Oncology History  CML (chronic myelocytic leukemia) ([place])  11/21/2021 Initial Diagnosis   CML (chronic myelocytic leukemia) ([place])   11/21/2021 Cancer Staging   Staging form: Chronic Myeloid Leukemia, AJCC 8th Edition - Clinical stage from 11/21/2021: Ph+ - Signed by Sindy Guadeloupe, MD on 11/21/2021 Stage prefix: Initial diagnosis      Interval history- onset of symptoms was 12/14/21. Complained of head cold like symptoms with aggravating cough, clear sputum, sore throat, diarrhea x 3 episodes. Had exposure. Took home test which was positive. Has not had vaccine.    Review of systems- Review of Systems  Constitutional:  Positive for malaise/fatigue. Negative for chills, diaphoresis, fever and weight loss.  HENT:  Negative  for congestion, ear discharge, ear pain, nosebleeds, sinus pain and sore throat.   Eyes:  Negative for pain and redness.  Respiratory:  Positive for cough and sputum production. Negative for hemoptysis, shortness of breath, wheezing and stridor.   Cardiovascular:  Negative for chest pain, palpitations and leg swelling.  Gastrointestinal:  Negative for abdominal pain, constipation, diarrhea, nausea and vomiting.  Musculoskeletal:  Negative for falls, joint pain and myalgias.  Skin:  Negative for rash.  Neurological:  Negative for dizziness, loss of consciousness, weakness and headaches.  Psychiatric/Behavioral:  Negative for depression. The patient is not nervous/anxious and does not have insomnia.   All other systems reviewed and are negative.    Allergies  Allergen Reactions   Bee Venom Anaphylaxis   Contrast Media [Iodinated Contrast Media] Anaphylaxis   Eggs Or Egg-Derived Products Anaphylaxis   Other Anaphylaxis    Surgical dye.. Pt states ok to take if previously taken benadryl or prednisone   Penicillins Hives and Other (See Comments)    Has patient had a PCN reaction causing immediate rash, facial/tongue/throat swelling, SOB or lightheadedness with hypotension: No Has patient had a PCN reaction causing severe rash involving mucus membranes or skin necrosis: No Has patient had a PCN reaction that required hospitalization No Has patient had a PCN reaction occurring within the last 10 years: No If all of the above answers are "NO", then may proceed with Cephalosporin use.   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[facility])    Pulmonary emboli ([place]) 2011   Pulmonary embolism ([facility])    Radiation    Brain tumor   UTI (urinary tract infection)    Vitamin D deficiency     Past Surgical History:  Procedure Laterality Date   CHOLECYSTECTOMY  1999   CYSTOSCOPY W/ RETROGRADES Left 03/01/2018   Procedure: CYSTOSCOPY WITH RETROGRADE PYELOGRAM;  Surgeon: Abbie Sons, MD;  Location: [facility];  Service: Urology;  Laterality: Left;   CYSTOSCOPY W/ URETERAL STENT PLACEMENT Right 04/08/2017   Procedure: CYSTOSCOPY WITH RETROGRADE PYELOGRAM/URETERAL STENT PLACEMENT;  Surgeon: Rana Snare, MD;  Location: [facility];  Service: Urology;  Laterality: Right;   CYSTOSCOPY W/ URETERAL STENT PLACEMENT Left 03/12/2018   Procedure: CYSTOSCOPY WITH STENT REPLACEMENT;  Surgeon: Abbie Sons, MD;  Location: [facility];  Service: Urology;  Laterality: Left;   CYSTOSCOPY W/ URETEROSCOPY     CYSTOSCOPY WITH RETROGRADE PYELOGRAM, URETEROSCOPY AND STENT PLACEMENT Left 01/20/2014   Procedure: LEFT URETEROSCOPY WITH STENT PLACEMENT;  Surgeon: Irine Seal, MD;  Location: [facility];  Service: Urology;  Laterality: Left;   CYSTOSCOPY WITH RETROGRADE PYELOGRAM, URETEROSCOPY AND STENT PLACEMENT Bilateral 04/23/2015   Procedure: CYSTOSCOPY WITH BILATERAL RETROGRADE PYELOGRAM, URETEROSCOPY AND STENT PLACEMENT;  Surgeon: Alexis Frock, MD;  Location: [facility];  Service: Urology;  Laterality: Bilateral;   CYSTOSCOPY WITH RETROGRADE PYELOGRAM, URETEROSCOPY AND STENT PLACEMENT Right 04/13/2017   Procedure: CYSTOSCOPY WITH RETROGRADE PYELOGRAM, URETEROSCOPY AND STENT EXCHANGE;  Surgeon: Alexis Frock, MD;  [facility]: [facility];  Service: Urology;  Laterality: Right;   CYSTOSCOPY WITH STENT PLACEMENT Left 01/12/2014   Procedure: CYSTOSCOPY WITH STENT PLACEMENT left retrograde;  Surgeon: Irine Seal, MD;  Location:  [facility];  Service: Urology;  Laterality: Left;   CYSTOSCOPY WITH STENT PLACEMENT Left 03/01/2018   Procedure: CYSTOSCOPY WITH STENT PLACEMENT;  Surgeon: Abbie Sons, MD;  Location: [facility];  Service: Urology;  Laterality: Left;   CYSTOSCOPY/URETEROSCOPY/HOLMIUM LASER/STENT PLACEMENT Left 03/12/2018   Procedure: CYSTOSCOPY/URETEROSCOPY/HOLMIUM LASER;  Surgeon: Abbie Sons, MD;  Location: [facility];  Service: Urology;  Laterality: Left;   CYSTOSCOPY/URETEROSCOPY/HOLMIUM LASER/STENT PLACEMENT Left 11/15/2021   Procedure: CYSTOSCOPY/URETEROSCOPY/HOLMIUM LASER/STENT PLACEMENT;  Surgeon: Abbie Sons, MD;  Location: [facility];  Service: Urology;  Laterality: Left;   HOLMIUM LASER APPLICATION Left 02/17/8249   Procedure: HOLMIUM LASER APPLICATION;  Surgeon: Irine Seal, MD;  [facility];  Service: Urology;  Laterality: Left;   HOLMIUM LASER APPLICATION Bilateral 04/19/9766   Procedure: HOLMIUM LASER APPLICATION;  Surgeon: Alexis Frock, MD;  Location: [facility];  Service: Urology;  Laterality: Bilateral;   HOLMIUM LASER APPLICATION Right 3/41/9379   Procedure: HOLMIUM LASER APPLICATION;  Surgeon: Alexis Frock, MD;  Location: [facility];  Service: Urology;  Laterality: Right;   kidney stone removal     LITHOTRIPSY     PARATHYROIDECTOMY Right 11/13/2016   Procedure: RIGHT SUPERIOR PARATHYROIDECTOMY;  Surgeon: Armandina Gemma, MD;  Location: [place];  Service: General;  Laterality: Right;   surgery for,ectopic pregnancy  2011    1 fallopian  tube rupture   TUBAL LIGATION  2011    Social History   Socioeconomic History   Marital status: Divorced    Spouse name: Not on file   Number of children: 4   Years of education: Not on file   Highest education level: Some college, no degree  Occupational History   Not on file  Tobacco Use   Smoking status: Never   Smokeless tobacco: Never  Vaping Use   Vaping Use: Never used  Substance and Sexual Activity   Alcohol use: Not Currently   Drug use: Yes     Types: Marijuana   Sexual activity: Not Currently  Other Topics Concern   Not on file  Social History Narrative   Lives at home with her son, independent at baselinepend   Social Determinants of Health   Financial Resource Strain: Not on file  Food Insecurity: Not on file  Transportation Needs: Not on file  Physical Activity: Not on file  Stress: Not on file  Social Connections: Not on file  Intimate Partner Violence: Not on file    Family History  Problem Relation Age of Onset   Bell's palsy Mother    Heart failure Mother    Hypertension Mother    Lupus Mother    Anxiety disorder Mother    Depression Mother    Stroke Mother    Asthma Father    Hypertension Father    Hyperlipidemia Father    Anxiety disorder Father    Depression Father    Hypertension Maternal Grandmother    Diabetes Maternal Grandmother    Sarcoidosis Maternal Grandmother    Anxiety disorder Sister    Depression Sister    Bipolar disorder Sister    Depression Son    Bipolar disorder Son      Current Outpatient Medications:    albuterol (PROVENTIL HFA;VENTOLIN HFA) 108 (90 Base) MCG/ACT inhaler, Inhale 2 puffs into the lungs every 6 (six) hours as needed for wheezing or shortness of breath., Disp: 1 Inhaler, Rfl: 2   albuterol (PROVENTIL) (2.5 MG/3ML) 0.083% nebulizer solution, Take 3 mLs (2.5 mg total) by nebulization every 6 (six) hours as needed for wheezing or shortness of breath., Disp: 75 mL, Rfl: 12   dasatinib (SPRYCEL) 100 MG tablet, Take 1 tablet (100 mg total) by mouth daily., Disp: 30 tablet, Rfl: 0   EPINEPHrine 0.3 mg/0.3 mL IJ SOAJ injection, Inject into the muscle. (Patient not taking: Reported on 11/21/2021), Disp: , Rfl:    oxybutynin (DITROPAN) 5 MG tablet, Take 5 mg by mouth 3 (three) times daily., Disp: , Rfl:    prochlorperazine (COMPAZINE) 10 MG tablet, Take 1 tablet (10 mg total) by mouth every 6 (six) hours as needed for nausea or vomiting., Disp: 30 tablet, Rfl:  0  Physical exam: Exam limited due to telemedicine  There were no vitals filed for this visit. Physical Exam Constitutional:      Comments: In bed. Sounds congested.   Pulmonary:     Effort: Pulmonary effort is normal. No respiratory distress.     CMP Latest Ref Rng & Units 12/06/2021  Glucose 70 - 99 mg/dL 111(H)  BUN 6 - 20 mg/dL 9  Creatinine 0.44 - 1.00 mg/dL 0.99  Sodium 135 - 145 mmol/L 137  Potassium 3.5 - 5.1 mmol/L 3.4(L)  Chloride 98 - 111 mmol/L 102  CO2 22 - 32 mmol/L 29  Calcium 8.9 - 10.3 mg/dL 8.7(L)  Total Protein 6.5 - 8.1 g/dL 7.6  Total Bilirubin  0.3 - 1.2 mg/dL 0.5  Alkaline Phos 38 - 126 U/L 77  AST 15 - 41 U/L 23  ALT 0 - 44 U/L 24   CBC Latest Ref Rng & Units 12/06/2021  WBC 4.0 - 10.5 K/uL 11.3(H)  Hemoglobin 12.0 - 15.0 g/dL 11.1(L)  Hematocrit 36.0 - 46.0 % 36.1  Platelets 150 - 400 K/uL 147(L)    No images are attached to the encounter.  No results found.  Assessment and plan- Patient is a 50 y.o. female   Covid-19 Virus Infection - I advised patient to stay well hydrated, particularly in the event of sustained or high fevers, in whom insensible fluid losses may be significant  - Cough that is persistent, interferes with sleep, or causes discomfort can be managed with an over-the-counter cough medication (eg, dextromethorphan) or prescription benzonatate, 100 to 200 mg orally three times daily as needed. Prescription sent. If symptoms persist, notify clinic for prescription cough syrup.  - I recommend rest as needed during the acute illness and frequent repositioning ambulation is encouraged. In addition, we encourage all patients to advance activity as soon as tolerated during recovery. - take mucinex to thin secretions - take tylenol and/or ibuprofen per label as needed for pain/fever/ body aches. Do not exceed 3000 mg in 24 hour period of tylenol - Reviewed symptoms that would warrant ER or hospital care - encouraged patient to monitor  oxygen levels with pulse oximeter, reviewed troubleshooting, and to seek ER for SpO2 < 94% - CDC criteria for quarantine and isolation reviewed - In non-hospitalized patients, we do not treat COVID-19 with dexamethasone, prednisone, or other corticosteroids. Per the literature, extrapolating from the results of studies of hospitalized patients, there is no evidence that corticosteroids benefit patients without a supplemental oxygen requirement, and further, they may be associated with poorer clinical outcomes - There is not high-quality data that suggests that supplementation with vitamin C, vitamin D, or zinc reduces the severity of COVID-19 in non-hospitalized patients - For patients with documented COVID-19, we do not routinely administer empiric therapy for bacterial pneumonia. Data are limited, but bacterial superinfection does not appear to be a prominent feature of COVID-19 - Start paxlovid. Labs reviewed. GFR > 60. 3 tablets BID x 5 days.  - Reviewed home isolation guidelines.     2. CML - Hold spyrcel during treatment - she is unvaccinated and declines vaccine. Could consider evusheld in the future.   3. Covid diarrhea - imodium per package instructions  Visit Diagnosis 1. COVID-19 virus infection   2. CML (chronic myelocytic leukemia) ([place])     Patient expressed understanding and was in agreement with this plan. She also understands that She can call clinic at any time with any questions, concerns, or complaints.   I discussed the assessment and treatment plan with the patient. The patient was provided an opportunity to ask questions and all were answered. The patient agreed with the plan and demonstrated an understanding of the instructions.   The patient was advised to call back or seek an in-person evaluation if the symptoms worsen or if the condition fails to improve as anticipated.   I spent 20 minutes face-to-face video visit time dedicated to the care of this patient on  the date of this encounter to include pre-visit review of med-onc notes, labs, medications, face-to-face time with the patient, and post visit ordering of testing/documentation.   Thank you for allowing me to participate in the care of this very pleasant patient.  Beckey Rutter, DNP, AGNP-C [facility] [place]: Dr. Janese Banks

## 2021-12-21 DIAGNOSIS — F4312 Post-traumatic stress disorder, chronic: Secondary | ICD-10-CM | POA: Diagnosis not present

## 2021-12-21 DIAGNOSIS — F330 Major depressive disorder, recurrent, mild: Secondary | ICD-10-CM | POA: Diagnosis not present

## 2021-12-23 ENCOUNTER — Other Ambulatory Visit: Payer: Self-pay

## 2021-12-23 ENCOUNTER — Inpatient Hospital Stay: Payer: Medicaid Other | Attending: Oncology

## 2021-12-23 ENCOUNTER — Ambulatory Visit: Payer: Medicaid Other | Admitting: Oncology

## 2021-12-23 ENCOUNTER — Other Ambulatory Visit: Payer: Medicaid Other

## 2021-12-23 DIAGNOSIS — D471 Chronic myeloproliferative disease: Secondary | ICD-10-CM

## 2021-12-23 DIAGNOSIS — C9210 Chronic myeloid leukemia, BCR/ABL-positive, not having achieved remission: Secondary | ICD-10-CM | POA: Diagnosis not present

## 2021-12-23 LAB — CBC WITH DIFFERENTIAL/PLATELET
Abs Immature Granulocytes: 0.04 10*3/uL (ref 0.00–0.07)
Basophils Absolute: 0.1 10*3/uL (ref 0.0–0.1)
Basophils Relative: 1 %
Eosinophils Absolute: 0.2 10*3/uL (ref 0.0–0.5)
Eosinophils Relative: 4 %
HCT: 35.2 % — ABNORMAL LOW (ref 36.0–46.0)
Hemoglobin: 10.7 g/dL — ABNORMAL LOW (ref 12.0–15.0)
Immature Granulocytes: 1 %
Lymphocytes Relative: 31 %
Lymphs Abs: 1.7 10*3/uL (ref 0.7–4.0)
MCH: 26.9 pg (ref 26.0–34.0)
MCHC: 30.4 g/dL (ref 30.0–36.0)
MCV: 88.4 fL (ref 80.0–100.0)
Monocytes Absolute: 0.9 10*3/uL (ref 0.1–1.0)
Monocytes Relative: 16 %
Neutro Abs: 2.6 10*3/uL (ref 1.7–7.7)
Neutrophils Relative %: 47 %
Platelets: 311 10*3/uL (ref 150–400)
RBC: 3.98 MIL/uL (ref 3.87–5.11)
RDW: 19.1 % — ABNORMAL HIGH (ref 11.5–15.5)
WBC: 5.5 10*3/uL (ref 4.0–10.5)
nRBC: 0 % (ref 0.0–0.2)

## 2021-12-30 DIAGNOSIS — F4312 Post-traumatic stress disorder, chronic: Secondary | ICD-10-CM | POA: Diagnosis not present

## 2021-12-30 DIAGNOSIS — F330 Major depressive disorder, recurrent, mild: Secondary | ICD-10-CM | POA: Diagnosis not present

## 2021-12-30 NOTE — Progress Notes (Signed)
Patient on schedule for BMB 01/04/2022, called and spoke with patient on phone with pre procedure instructions given. Made aware to be here at 0800, NPO after MN prior to procedure as well as driver post procedure/recovery/discharge. Stated understanding.

## 2022-01-03 ENCOUNTER — Other Ambulatory Visit: Payer: Self-pay | Admitting: Radiology

## 2022-01-04 ENCOUNTER — Ambulatory Visit
Admission: RE | Admit: 2022-01-04 | Discharge: 2022-01-04 | Disposition: A | Discharge status: Home / Self Care | Payer: Medicaid Other | Source: Ambulatory Visit | Attending: Oncology | Admitting: Oncology

## 2022-01-05 ENCOUNTER — Other Ambulatory Visit (HOSPITAL_COMMUNITY): Payer: Self-pay

## 2022-01-12 ENCOUNTER — Encounter: Payer: Self-pay | Admitting: Urology

## 2022-01-13 ENCOUNTER — Inpatient Hospital Stay: Payer: Medicaid Other

## 2022-01-13 ENCOUNTER — Inpatient Hospital Stay: Payer: Medicaid Other | Admitting: Oncology

## 2022-01-16 ENCOUNTER — Telehealth: Payer: Self-pay | Admitting: Oncology

## 2022-01-16 NOTE — Telephone Encounter (Signed)
Spoke with patient to confirm rescheduled biopsy and MD f/u. She was agreeable to all days and times.

## 2022-01-17 ENCOUNTER — Telehealth: Payer: Self-pay | Admitting: Oncology

## 2022-01-17 ENCOUNTER — Inpatient Hospital Stay: Payer: Medicaid Other

## 2022-01-17 NOTE — Telephone Encounter (Signed)
Pt called to reschedule her appt for today. Call back at [phone]

## 2022-01-18 ENCOUNTER — Other Ambulatory Visit (HOSPITAL_COMMUNITY): Payer: Self-pay

## 2022-01-18 ENCOUNTER — Other Ambulatory Visit: Payer: Self-pay | Admitting: Pharmacist

## 2022-01-18 DIAGNOSIS — C9210 Chronic myeloid leukemia, BCR/ABL-positive, not having achieved remission: Secondary | ICD-10-CM

## 2022-01-20 ENCOUNTER — Encounter: Payer: Self-pay | Admitting: Pharmacist

## 2022-01-20 ENCOUNTER — Other Ambulatory Visit: Payer: Self-pay | Admitting: Pharmacist

## 2022-01-20 ENCOUNTER — Other Ambulatory Visit (HOSPITAL_COMMUNITY): Payer: Self-pay

## 2022-01-20 ENCOUNTER — Inpatient Hospital Stay: Payer: Medicaid Other | Attending: Oncology

## 2022-01-20 ENCOUNTER — Inpatient Hospital Stay: Payer: Medicaid Other | Admitting: Pharmacist

## 2022-01-20 ENCOUNTER — Other Ambulatory Visit: Payer: Self-pay

## 2022-01-20 VITALS — BP 134/60 | HR 89 | Temp 97.8°F | Resp 16 | Wt 325.0 lb

## 2022-01-20 DIAGNOSIS — C9210 Chronic myeloid leukemia, BCR/ABL-positive, not having achieved remission: Secondary | ICD-10-CM

## 2022-01-20 LAB — CBC WITH DIFFERENTIAL/PLATELET
Abs Immature Granulocytes: 0 10*3/uL (ref 0.00–0.07)
Basophils Absolute: 0 10*3/uL (ref 0.0–0.1)
Basophils Relative: 1 %
Eosinophils Absolute: 0.1 10*3/uL (ref 0.0–0.5)
Eosinophils Relative: 2 %
HCT: 34.1 % — ABNORMAL LOW (ref 36.0–46.0)
Hemoglobin: 10.5 g/dL — ABNORMAL LOW (ref 12.0–15.0)
Immature Granulocytes: 0 %
Lymphocytes Relative: 39 %
Lymphs Abs: 1.6 10*3/uL (ref 0.7–4.0)
MCH: 27.4 pg (ref 26.0–34.0)
MCHC: 30.8 g/dL (ref 30.0–36.0)
MCV: 89 fL (ref 80.0–100.0)
Monocytes Absolute: 0.5 10*3/uL (ref 0.1–1.0)
Monocytes Relative: 12 %
Neutro Abs: 1.9 10*3/uL (ref 1.7–7.7)
Neutrophils Relative %: 46 %
Platelets: 222 10*3/uL (ref 150–400)
RBC: 3.83 MIL/uL — ABNORMAL LOW (ref 3.87–5.11)
RDW: 18.6 % — ABNORMAL HIGH (ref 11.5–15.5)
WBC: 4.1 10*3/uL (ref 4.0–10.5)
nRBC: 0 % (ref 0.0–0.2)

## 2022-01-20 LAB — COMPREHENSIVE METABOLIC PANEL
ALT: 15 U/L (ref 0–44)
AST: 17 U/L (ref 15–41)
Albumin: 3.6 g/dL (ref 3.5–5.0)
Alkaline Phosphatase: 80 U/L (ref 38–126)
Anion gap: 4 — ABNORMAL LOW (ref 5–15)
BUN: 10 mg/dL (ref 6–20)
CO2: 27 mmol/L (ref 22–32)
Calcium: 8.6 mg/dL — ABNORMAL LOW (ref 8.9–10.3)
Chloride: 105 mmol/L (ref 98–111)
Creatinine, Ser: 0.69 mg/dL (ref 0.44–1.00)
GFR, Estimated: >60 mL/min (ref >60)
Glucose, Bld: 97 mg/dL (ref 70–99)
Potassium: 3.7 mmol/L (ref 3.5–5.1)
Sodium: 136 mmol/L (ref 135–145)
Total Bilirubin: 0.5 mg/dL (ref 0.3–1.2)
Total Protein: 7.6 g/dL (ref 6.5–8.1)

## 2022-01-20 LAB — TSH: TSH: 2.541 u[IU]/mL (ref 0.350–4.500)

## 2022-01-20 MED ORDER — DASATINIB 100 MG PO TABS
100.0000 mg | ORAL_TABLET | Freq: Every day | ORAL | 0 refills | Status: DC
  Filled 2022-01-20: qty 30, 30d supply, fill #0

## 2022-01-20 NOTE — Progress Notes (Signed)
Patient on schedule for BMB 01/25/2022, called and spoke with patient on phone with pre procedure instructions given. Made aware to be here @ 0800, NPO after MN prior to procedure as well as driver post procedure/recovery/discharge. Stated understanding. Daughter to provide discharge transportation  [phone]

## 2022-01-20 NOTE — Progress Notes (Signed)
Pt in for follow up, states has noticed being more short of breath and feels like has some fluid on lungs.

## 2022-01-20 NOTE — Progress Notes (Signed)
[facility]  [phone] [phone]  Patient Care Team: Berkley Harvey, NP as PCP - General (Nurse Practitioner)   Name of the patient: Carol Ortega  [id number]  November 06, 1971   Date of visit: 01/20/22  HPI: Patient is a 51 y.o. female with newly diagnosed CML. She was started on treatment with Sprycel (dasatinib) on 11/17/21.  Reason for Consult: Oral chemotherapy follow-up for dasatinib therapy.   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[facility])    Pulmonary emboli ([place]) 2011   Pulmonary embolism ([facility])    Radiation    Brain tumor   UTI (urinary tract infection)    Vitamin D deficiency     HEMATOLOGY/ONCOLOGY HISTORY:  Oncology History  CML (chronic myelocytic leukemia) ([place])  11/21/2021 Initial Diagnosis   CML (chronic myelocytic leukemia) ([place])   11/21/2021 Cancer Staging   Staging form: Chronic Myeloid Leukemia, AJCC 8th Edition - Clinical stage from 11/21/2021: Ph+ - Signed by Sindy Guadeloupe, MD on 11/21/2021 Stage prefix: Initial diagnosis      ALLERGIES:  is allergic to bee venom, contrast media [iodinated contrast media], eggs or egg-derived products, other, penicillins, wellbutrin [bupropion], latex, and oxycodone-acetaminophen.  MEDICATIONS:  Current Outpatient Medications  Medication Sig Dispense Refill   albuterol (PROVENTIL HFA;VENTOLIN HFA) 108 (90 Base) MCG/ACT inhaler Inhale 2 puffs into the lungs every 6 (six) hours as needed for wheezing or shortness of breath. 1 Inhaler  2   albuterol (PROVENTIL) (2.5 MG/3ML) 0.083% nebulizer solution Take 3 mLs (2.5 mg total) by nebulization every 6 (six) hours as needed for wheezing or shortness of breath. 75 mL 12   dasatinib (SPRYCEL) 100 MG tablet Take 1 tablet (100 mg total) by mouth daily. 30 tablet 0   EPINEPHrine 0.3 mg/0.3 mL IJ SOAJ injection Inject into the muscle. (Patient not taking: Reported on 11/21/2021)     oxybutynin (DITROPAN) 5 MG tablet Take 5 mg by mouth 3 (three) times daily.     prochlorperazine (COMPAZINE) 10 MG tablet Take 1 tablet (10 mg total) by mouth every 6 (six) hours as needed for nausea or vomiting. 30 tablet 0   No current facility-administered medications for this visit.    VITAL SIGNS: There were no vitals taken for this visit. There were no vitals filed for this visit.  Estimated body mass index is 50.04 kg/m as calculated from the following:   Height as of 12/06/21: 5\' 6"  (1.676 m).   Weight as of 12/06/21: 140.6 kg (310 lb).  LABS: CBC:    Component Value Date/Time   WBC 4.1 01/20/2022 1411   HGB 10.5 (L) 01/20/2022 1411   HGB 11.9 11/08/2021 1744   HCT 34.1 (L) 01/20/2022 1411   HCT 41.2 09/26/2018 1612   PLT 222 01/20/2022 1411   PLT 377 09/26/2018 1612   MCV 89.0 01/20/2022 1411   MCV 84 09/26/2018 1612   NEUTROABS 1.9 01/20/2022 1411  NEUTROABS 3.4 09/26/2018 1612   LYMPHSABS 1.6 01/20/2022 1411   LYMPHSABS 3.0 09/26/2018 1612   MONOABS 0.5 01/20/2022 1411   EOSABS 0.1 01/20/2022 1411   EOSABS 0.3 09/26/2018 1612   BASOSABS 0.0 01/20/2022 1411   BASOSABS 0.1 09/26/2018 1612   Comprehensive Metabolic Panel:    Component Value Date/Time   NA 137 12/06/2021 1434   NA 142 09/26/2018 1612   K 3.4 (L) 12/06/2021 1434   CL 102 12/06/2021 1434   CO2 29 12/06/2021 1434   BUN 9 12/06/2021 1434   BUN 9 09/26/2018 1612   CREATININE 0.99 12/06/2021 1434   GLUCOSE 111 (H) 12/06/2021 1434   CALCIUM 8.7 (L) 12/06/2021 1434   AST 23 12/06/2021 1434   ALT 24 12/06/2021  1434   ALKPHOS 77 12/06/2021 1434   BILITOT 0.5 12/06/2021 1434   BILITOT 0.3 09/26/2018 1612   PROT 7.6 12/06/2021 1434   PROT 7.7 09/26/2018 1612   ALBUMIN 3.7 12/06/2021 1434   ALBUMIN 4.4 09/26/2018 1612     Present during today's visit: patient only  Assessment and Plan: Reviewed CBC/CMP with patient, continue dasatinib 100mg  daily BCR-ABL labs checked today, results pending   Oral Chemotherapy Side Effect/Intolerance:  No reported rash, edema, nausea, or diarrhea Nausea: patient had nausea and a significant decrease in appetite with the initiation of dasatinib. Both have since resolved. She is eating about one meal per day and snacking through out the day Edema: over the phone this morning, patient reported having puffiness in her feet and face. During visit today, there was no edema present in her feet or ankles. No reported shortness of breath or trouble breathing when laying down. Weight has increased ~ 15lbs since her least office visit on 12/06/21. Face puffiness may be due to the return of her appetite and the increase in weight. Instructed patient to continue to monitor for shortness of breath and her feet for edema. She should call the office to report either.   Oral Chemotherapy Adherence: No missed doses reported No patient barriers to medication adherence identified.   New medications: None reported  Medication Access Issues: no issues, patient fills at [facility], refill sent today  Patient expressed understanding and was in agreement with this plan. She also understands that She can call clinic at any time with any questions, concerns, or complaints.   Follow-up plan: RTC in 2 weeks for MD visit following BMx  Thank you for allowing me to participate in the care of this very pleasant patient.   Time Total: 7mins  Visit consisted of counseling and education on dealing with issues of symptom management in the setting of serious and potentially  life-threatening illness.Greater than 50%  of this time was spent counseling and coordinating care related to the above assessment and plan.  Signed by: Darl Pikes, PharmD, BCPS, Salley Slaughter, CPP Hematology/Oncology Clinical Pharmacist Practitioner [place]/DB/AP Oral [facility] [phone]  01/20/2022 2:32 PM

## 2022-01-23 ENCOUNTER — Other Ambulatory Visit (HOSPITAL_COMMUNITY): Payer: Self-pay

## 2022-01-24 ENCOUNTER — Other Ambulatory Visit: Payer: Self-pay | Admitting: Student

## 2022-01-24 ENCOUNTER — Other Ambulatory Visit (HOSPITAL_COMMUNITY): Payer: Self-pay

## 2022-01-25 ENCOUNTER — Ambulatory Visit
Admission: RE | Admit: 2022-01-25 | Discharge: 2022-01-25 | Disposition: A | Discharge status: Home / Self Care | Payer: Medicaid Other | Source: Ambulatory Visit | Attending: Oncology | Admitting: Oncology

## 2022-01-25 ENCOUNTER — Other Ambulatory Visit: Payer: Self-pay

## 2022-01-25 DIAGNOSIS — J45909 Unspecified asthma, uncomplicated: Secondary | ICD-10-CM | POA: Insufficient documentation

## 2022-01-25 DIAGNOSIS — F419 Anxiety disorder, unspecified: Secondary | ICD-10-CM | POA: Diagnosis not present

## 2022-01-25 DIAGNOSIS — I10 Essential (primary) hypertension: Secondary | ICD-10-CM | POA: Insufficient documentation

## 2022-01-25 DIAGNOSIS — D469 Myelodysplastic syndrome, unspecified: Secondary | ICD-10-CM | POA: Diagnosis present

## 2022-01-25 DIAGNOSIS — D649 Anemia, unspecified: Secondary | ICD-10-CM | POA: Insufficient documentation

## 2022-01-25 DIAGNOSIS — C9210 Chronic myeloid leukemia, BCR/ABL-positive, not having achieved remission: Secondary | ICD-10-CM | POA: Insufficient documentation

## 2022-01-25 DIAGNOSIS — F32A Depression, unspecified: Secondary | ICD-10-CM | POA: Insufficient documentation

## 2022-01-25 LAB — CBC WITH DIFFERENTIAL/PLATELET
Abs Immature Granulocytes: 0.02 10*3/uL (ref 0.00–0.07)
Basophils Absolute: 0 10*3/uL (ref 0.0–0.1)
Basophils Relative: 1 %
Eosinophils Absolute: 0.1 10*3/uL (ref 0.0–0.5)
Eosinophils Relative: 3 %
HCT: 34.3 % — ABNORMAL LOW (ref 36.0–46.0)
Hemoglobin: 10.4 g/dL — ABNORMAL LOW (ref 12.0–15.0)
Immature Granulocytes: 1 %
Lymphocytes Relative: 43 %
Lymphs Abs: 1.7 10*3/uL (ref 0.7–4.0)
MCH: 27 pg (ref 26.0–34.0)
MCHC: 30.3 g/dL (ref 30.0–36.0)
MCV: 89.1 fL (ref 80.0–100.0)
Monocytes Absolute: 0.5 10*3/uL (ref 0.1–1.0)
Monocytes Relative: 12 %
Neutro Abs: 1.6 10*3/uL — ABNORMAL LOW (ref 1.7–7.7)
Neutrophils Relative %: 40 %
Platelets: 268 10*3/uL (ref 150–400)
RBC: 3.85 MIL/uL — ABNORMAL LOW (ref 3.87–5.11)
RDW: 19 % — ABNORMAL HIGH (ref 11.5–15.5)
WBC: 4 10*3/uL (ref 4.0–10.5)
nRBC: 0 % (ref 0.0–0.2)

## 2022-01-25 LAB — BCR-ABL1 FISH
Cells Analyzed: 150
Cells Counted: 150

## 2022-01-25 MED ORDER — MIDAZOLAM HCL 2 MG/2ML IJ SOLN
INTRAMUSCULAR | Status: AC | PRN
  Administered 2022-01-25 (×2): 1 mg via INTRAVENOUS

## 2022-01-25 MED ORDER — SODIUM CHLORIDE 0.9 % IV SOLN: INTRAVENOUS | Status: DC

## 2022-01-25 MED ORDER — FENTANYL CITRATE (PF) 100 MCG/2ML IJ SOLN
INTRAMUSCULAR | Status: AC
  Filled 2022-01-25: qty 2

## 2022-01-25 MED ORDER — HEPARIN SOD (PORK) LOCK FLUSH 100 UNIT/ML IV SOLN
INTRAVENOUS | Status: AC
  Filled 2022-01-25: qty 5

## 2022-01-25 MED ORDER — MIDAZOLAM HCL 2 MG/2ML IJ SOLN
INTRAMUSCULAR | Status: AC
  Filled 2022-01-25: qty 2

## 2022-01-25 MED ORDER — FENTANYL CITRATE (PF) 100 MCG/2ML IJ SOLN
INTRAMUSCULAR | Status: AC | PRN
  Administered 2022-01-25 (×2): 50 ug via INTRAVENOUS

## 2022-01-25 NOTE — Procedures (Signed)
Interventional Radiology Procedure Note  Date of Procedure: 01/25/2022  Procedure: BMBx  Findings:  1. CT BMBx, left ilium   Complications: No immediate complications noted.   Estimated Blood Loss: minimal  Follow-up and Recommendations: 1. Bedrest 1 hour    Albin Felling, MD  Vascular & Interventional Radiology  01/25/2022 10:35 AM

## 2022-01-25 NOTE — H&P (Signed)
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 female with a past medical history significant for anxiety, depression, ashtma, HTN, PE, anemia, benign brain tumor s/p radiation and CML who presents today for a bone marrow biopsy with moderate sedation. Carol Ortega is followed by oncology for [facility], she was recently admitted to [facility] with left UPJ calculus and renal colic requiring lithotripsy and ureteral stent placemen. During her admission she was found to have WBC of 138 with pathology smear showing findings concerning for myeloproliferative neoplasm. She has been referred to IR for bone marrow aspiration/biopsy to further direct care.  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[facility])    Pulmonary emboli ([place]) 2011   Pulmonary embolism ([facility])    Radiation    Brain tumor   UTI (urinary tract infection)    Vitamin D deficiency     Past Surgical History:  Procedure Laterality Date   CHOLECYSTECTOMY  1999   CYSTOSCOPY W/ RETROGRADES Left 03/01/2018   Procedure: CYSTOSCOPY WITH RETROGRADE PYELOGRAM;  Surgeon: Abbie Sons, MD;  Location: [facility];  Service: Urology;  Laterality: Left;   CYSTOSCOPY W/ URETERAL STENT PLACEMENT Right 04/08/2017   Procedure: CYSTOSCOPY WITH RETROGRADE PYELOGRAM/URETERAL STENT PLACEMENT;  Surgeon: Rana Snare, MD;  Location: [facility];  Service: Urology;  Laterality:  Right;   CYSTOSCOPY W/ URETERAL STENT PLACEMENT Left 03/12/2018   Procedure: CYSTOSCOPY WITH STENT REPLACEMENT;  Surgeon: Abbie Sons, MD;  Location: [facility];  Service: Urology;  Laterality: Left;   CYSTOSCOPY W/ URETEROSCOPY     CYSTOSCOPY WITH RETROGRADE PYELOGRAM, URETEROSCOPY AND STENT PLACEMENT Left 01/20/2014   Procedure: LEFT URETEROSCOPY WITH STENT PLACEMENT;  Surgeon: Irine Seal, MD;  Location: [facility];  Service: Urology;  Laterality: Left;   CYSTOSCOPY WITH RETROGRADE PYELOGRAM, URETEROSCOPY AND STENT PLACEMENT Bilateral 04/23/2015   Procedure: CYSTOSCOPY WITH BILATERAL RETROGRADE PYELOGRAM, URETEROSCOPY AND STENT PLACEMENT;  Surgeon: Alexis Frock, MD;  Location: [facility];  Service: Urology;  Laterality: Bilateral;   CYSTOSCOPY WITH RETROGRADE PYELOGRAM, URETEROSCOPY AND STENT PLACEMENT Right 04/13/2017   Procedure: CYSTOSCOPY WITH RETROGRADE PYELOGRAM, URETEROSCOPY AND STENT EXCHANGE;  Surgeon: Alexis Frock, MD;  Location: [facility];  Service: Urology;  Laterality: Right;   CYSTOSCOPY WITH STENT PLACEMENT Left 01/12/2014   Procedure: CYSTOSCOPY WITH STENT PLACEMENT left retrograde;  Surgeon: Irine Seal, MD;  Location: [facility];  Service: Urology;  Laterality: Left;   CYSTOSCOPY WITH STENT PLACEMENT Left 03/01/2018   Procedure: CYSTOSCOPY WITH STENT PLACEMENT;  Surgeon: Abbie Sons, MD;  Location: [facility];  Service: Urology;  Laterality: Left;   CYSTOSCOPY/URETEROSCOPY/HOLMIUM LASER/STENT PLACEMENT Left 03/12/2018   Procedure: CYSTOSCOPY/URETEROSCOPY/HOLMIUM LASER;  Surgeon: Abbie Sons, MD;  Location: [facility];  Service: Urology;  Laterality: Left;   CYSTOSCOPY/URETEROSCOPY/HOLMIUM LASER/STENT PLACEMENT Left 11/15/2021   Procedure: CYSTOSCOPY/URETEROSCOPY/HOLMIUM  LASER/STENT PLACEMENT;  Surgeon: Abbie Sons, MD;  Location: [facility];  Service: Urology;  Laterality: Left;   HOLMIUM LASER APPLICATION Left 05/25/320   Procedure: HOLMIUM LASER APPLICATION;  Surgeon: Irine Seal, MD;   Location: [facility];  Service: Urology;  Laterality: Left;   HOLMIUM LASER APPLICATION Bilateral 01/20/4824   Procedure: HOLMIUM LASER APPLICATION;  Surgeon: Alexis Frock, MD;  Location: [facility];  Service: Urology;  Laterality: Bilateral;   HOLMIUM LASER APPLICATION Right 0/02/7047   Procedure: HOLMIUM LASER APPLICATION;  Surgeon: Alexis Frock, MD;  Location: [facility];  Service: Urology;  Laterality: Right;   kidney stone removal     LITHOTRIPSY     PARATHYROIDECTOMY Right 11/13/2016   Procedure: RIGHT SUPERIOR PARATHYROIDECTOMY;  Surgeon: Armandina Gemma, MD;  Location: [place];  Service: General;  Laterality: Right;   surgery for,ectopic pregnancy  2011    1 fallopian tube rupture   TUBAL LIGATION  2011    Allergies: Bee venom, Contrast media [iodinated contrast media], Eggs or egg-derived products, Other, Penicillins, Wellbutrin [bupropion], Latex, and Oxycodone-acetaminophen  Medications: Prior to Admission medications   Medication Sig Start Date End Date Taking? Authorizing Provider  albuterol (PROVENTIL HFA;VENTOLIN HFA) 108 (90 Base) MCG/ACT inhaler Inhale 2 puffs into the lungs every 6 (six) hours as needed for wheezing or shortness of breath. 03/04/18   Loletha Grayer, MD  albuterol (PROVENTIL) (2.5 MG/3ML) 0.083% nebulizer solution Take 3 mLs (2.5 mg total) by nebulization every 6 (six) hours as needed for wheezing or shortness of breath. 03/04/18   Loletha Grayer, MD  dasatinib (SPRYCEL) 100 MG tablet Take 1 tablet (100 mg total) by mouth daily. 01/20/22   Darl Pikes, RPH-CPP  EPINEPHrine 0.3 mg/0.3 mL IJ SOAJ injection Inject into the muscle. Patient not taking: Reported on 11/21/2021 10/13/19   [provider]     Family History  Problem Relation Age of Onset   Bell's palsy Mother    Heart failure Mother    Hypertension Mother    Lupus Mother    Anxiety disorder Mother    Depression Mother    Stroke Mother    Asthma Father    Hypertension Father     Hyperlipidemia Father    Anxiety disorder Father    Depression Father    Hypertension Maternal Grandmother    Diabetes Maternal Grandmother    Sarcoidosis Maternal Grandmother    Anxiety disorder Sister    Depression Sister    Bipolar disorder Sister    Depression Son    Bipolar disorder Son     Social History   Socioeconomic History   Marital status: Divorced    Spouse name: Not on file   Number of children: 4   Years of education: Not on file   Highest education level: Some college, no degree  Occupational History   Not on file  Tobacco Use   Smoking status: Never   Smokeless tobacco: Never  Vaping Use   Vaping Use: Never used  Substance and Sexual Activity   Alcohol use: Not Currently   Drug use: Yes    Types: Marijuana   Sexual activity: Not Currently  Other Topics Concern   Not on file  Social History Narrative   Lives at home with her son, independent at baselinepend   Social Determinants of Health   Financial Resource Strain: Not on file  Food Insecurity: Not on file  Transportation Needs: Not on file  Physical Activity: Not on file  Stress: Not on file  Social Connections: Not on file     Review of Systems: A 12 point ROS discussed and pertinent positives are indicated in the HPI above.  All other systems are negative.  Review of Systems  Constitutional:  Negative for chills and fever.  Respiratory:  Negative for cough and shortness of breath.   Cardiovascular:  Negative for chest pain.  Gastrointestinal:  Negative for abdominal pain, nausea and vomiting.  Musculoskeletal:  Negative for back pain.  Neurological:  Negative for dizziness and headaches.   Vital Signs: BP 137/88    Pulse 81    Temp 98 F (36.7 C) (Oral)    Resp 18    Ht '5\' 7"'  (1.702 m)    Wt (!) 320 lb (145.2 kg)    SpO2 100%    BMI 50.12 kg/m   Physical Exam Vitals reviewed.  Constitutional:      General: She is not in acute distress. HENT:     Head: Normocephalic.      Mouth/Throat:     Mouth: Mucous membranes are moist.     Pharynx: Oropharynx is clear. No oropharyngeal exudate or posterior oropharyngeal erythema.  Cardiovascular:     Rate and Rhythm: Normal rate and regular rhythm.  Pulmonary:     Effort: Pulmonary effort is normal.     Breath sounds: Normal breath sounds.  Abdominal:     General: There is no distension.     Palpations: Abdomen is soft.     Tenderness: There is no abdominal tenderness.  Skin:    General: Skin is warm and dry.  Neurological:     Mental Status: She is alert and oriented to person, place, and time.  Psychiatric:        Mood and Affect: Mood normal.        Behavior: Behavior normal.        Thought Content: Thought content normal.        Judgment: Judgment normal.     MD Evaluation Airway: WNL Heart: WNL Abdomen: WNL Chest/ Lungs: WNL ASA  Classification: 2 Mallampati/Airway Score: Two   Imaging: No results found.  Labs:  CBC: Recent Labs    12/06/21 1434 12/23/21 1342 01/20/22 1411 01/25/22 0812  WBC 11.3* 5.5 4.1 4.0  HGB 11.1* 10.7* 10.5* 10.4*  HCT 36.1 35.2* 34.1* 34.3*  PLT 147* 311 222 268    COAGS: Recent Labs    11/08/21 1744  INR 1.2  APTT 33    BMP: Recent Labs    11/21/21 0923 11/28/21 1134 12/06/21 1434 01/20/22 1411  NA 137 137 137 136  K 3.7 3.9 3.4* 3.7  CL 104 101 102 105  CO2 '26 28 29 27  ' GLUCOSE 120* 111* 111* 97  BUN '11 10 9 10  ' CALCIUM 8.7* 8.9 8.7* 8.6*  CREATININE 0.90 0.81 0.99 0.69  GFRNONAA >60 >60 >60 >60    LIVER FUNCTION TESTS: Recent Labs    11/08/21 1111 11/28/21 1134 12/06/21 1434 01/20/22 1411  BILITOT 0.8 0.7 0.5 0.5  AST '23 26 23 17  ' ALT '15 30 24 15  ' ALKPHOS 76 80 77 80  PROT 8.5* 7.7 7.6 7.6  ALBUMIN 4.0 3.7 3.7 3.6    TUMOR MARKERS: No results for input(s): AFPTM, CEA, CA199, CHROMGRNA in the last 8760 hours.  Assessment and Plan:  51 y/o F with recently diagnosed CML who presents today for a bone marrow biopsy with  moderate sedation to confirm accelerated phase CML.  Risks and benefits of bone  marrow aspiration/biopsy was discussed with the patient and/or patient's family including, but not limited to bleeding, infection, damage to adjacent structures or low yield requiring additional tests.  All of the questions were answered and there is agreement to proceed.  Consent signed and in chart.   Thank you for this interesting consult.  I greatly enjoyed meeting Carol Ortega and look forward to participating in their care.  A copy of this report was sent to the requesting provider on this date.  Electronically Signed: Joaquim Nam, PA-C 01/25/2022, 8:06 AM   I spent a total of  30 Minutes   in face to face in clinical consultation, greater than 50% of which was counseling/coordinating care for bone marrow biopsy.

## 2022-01-25 NOTE — Progress Notes (Signed)
Patient clinically stable post BMB per Dr Denna Haggard, tolerated well. Received Versed 2 mg along with Fentanyl 100 mcg IV for procedure. Report given to [place] at bedside post procedure in specials. Vitals stable pre and post procedure.

## 2022-01-27 LAB — SURGICAL PATHOLOGY

## 2022-01-29 LAB — BCR-ABL1, CML/ALL, PCR, QUANT: b2a2 transcript: 81.3021 %

## 2022-01-30 ENCOUNTER — Ambulatory Visit: Payer: Medicaid Other | Admitting: Urology

## 2022-02-01 ENCOUNTER — Inpatient Hospital Stay (HOSPITAL_BASED_OUTPATIENT_CLINIC_OR_DEPARTMENT_OTHER): Payer: Medicaid Other | Admitting: Oncology

## 2022-02-01 ENCOUNTER — Ambulatory Visit: Payer: Medicaid Other | Admitting: Urology

## 2022-02-01 DIAGNOSIS — Z79899 Other long term (current) drug therapy: Secondary | ICD-10-CM

## 2022-02-01 DIAGNOSIS — C9210 Chronic myeloid leukemia, BCR/ABL-positive, not having achieved remission: Secondary | ICD-10-CM

## 2022-02-01 NOTE — Progress Notes (Signed)
Pt says she thinks she has some sinus issues- she woke up this am with congested left nostril and tenderness on left side neck and ear. She also says that she starts new pack of sprycel she has 1 diarrhea episode and then it is back to normal. She will be getting her bone marrow bx .results today

## 2022-02-03 ENCOUNTER — Encounter (HOSPITAL_COMMUNITY): Payer: Self-pay | Admitting: Oncology

## 2022-02-07 ENCOUNTER — Ambulatory Visit: Payer: Medicaid Other | Admitting: Oncology

## 2022-02-09 DIAGNOSIS — F330 Major depressive disorder, recurrent, mild: Secondary | ICD-10-CM | POA: Diagnosis not present

## 2022-02-09 DIAGNOSIS — F4312 Post-traumatic stress disorder, chronic: Secondary | ICD-10-CM | POA: Diagnosis not present

## 2022-02-12 ENCOUNTER — Encounter: Payer: Self-pay | Admitting: Oncology

## 2022-02-12 NOTE — Progress Notes (Signed)
I connected with Carol Ortega on 02/12/22 at  2:00 PM EST by video enabled telemedicine visit and verified that I am speaking with the correct person using two identifiers.   I discussed the limitations, risks, security and privacy concerns of performing an evaluation and management service by telemedicine and the availability of in-person appointments. I also discussed with the Carol Ortega that there may be a Carol Ortega responsible charge related to this service. The Carol Ortega expressed understanding and agreed to proceed.  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  CT abdomen and pelvis without contrast showed moderate left hydronephrosis secondary to a 1.1 x 0.9 x 1.3 cm calculus at the left ureteropelvic junction/proximal left ureter.  No other evidence of adenopathy and spleen appeared normal.  Incidentally Carol Ortega was noted to have a white cell count of 138 which on repeat check was 133.  Platelet counts normal and hemoglobin was 12.1.  Last normal CBC was in April 2021 when white count was 7.2.  Presently differential mainly shows neutrophilia along with lymphocytosis monocytosis basophilia and eosinophilia.  Immature granulocytes were seen.  Pathology smear review shows findings concerning for myeloproliferative neoplasm.  Blasts possibly less than 20% and BCR ABL testing was recommended.Carol Ortega noted to have wbc of 18 in may 2022 again with predominant left shift.   BCR ABL FISH testing showed 100% nuclei positive for BCR-ABL gene fusion signals.  BCR ABL PCR testing showed B2 A2 transcript 847.313 and B3 A2  transcript less than 0.0032%.  E1 A2 transcript 0.5645%.  Peripheral blood flow cytometry showedAberrant myeloblast population about 2% of the leukocytes.  Absolute neutrophilia eosinophilia and basophilia.  Carol Ortega started Dasatinib in early December 2022. Bone marrow biopsy on 01/25/2021 showed hypercellular bone marrow with pan myeloid proliferation.  No increased number of blasts seen.    Interval history Carol Ortega is tolerating Dasatinib well.  Has occasional nauseaAnd fatigue but denies other complaints at this time   Review of Systems  Constitutional:  Positive for malaise/fatigue. Negative for chills, fever and weight loss.  HENT:  Negative for congestion, ear discharge and nosebleeds.   Eyes:  Negative for blurred vision.  Respiratory:  Negative for cough, hemoptysis, sputum production, shortness of breath and wheezing.   Cardiovascular:  Negative for chest pain, palpitations, orthopnea and claudication.  Gastrointestinal:  Positive for nausea. Negative for abdominal pain, blood in stool, constipation, diarrhea, heartburn, melena and vomiting.  Genitourinary:  Negative for dysuria, flank pain, frequency, hematuria and urgency.  Musculoskeletal:  Negative for back pain, joint pain and myalgias.  Skin:  Negative for rash.  Neurological:  Negative for dizziness, tingling, focal weakness, seizures, weakness and headaches.  Endo/Heme/Allergies:  Does not bruise/bleed easily.  Psychiatric/Behavioral:  Negative for depression and suicidal ideas. The Carol Ortega does not have insomnia.    Allergies  Allergen Reactions   Bee Venom Anaphylaxis   Contrast Media [Iodinated Contrast Media] Anaphylaxis   Eggs Or Egg-Derived Products Anaphylaxis   Other Anaphylaxis    Surgical dye.. Pt states ok to take if previously taken benadryl or prednisone   Penicillins Hives and Other (See Comments)    Has Carol Ortega had a PCN  reaction causing immediate rash, facial/tongue/throat swelling, SOB or lightheadedness  with hypotension: No Has Carol Ortega had a PCN reaction causing severe rash involving mucus membranes or skin necrosis: No Has Carol Ortega had a PCN reaction that required hospitalization No Has Carol Ortega had a PCN reaction occurring within the last 10 years: No If all of the above answers are "NO", then may proceed with Cephalosporin use.   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[facility])    Pulmonary emboli ([name]) 2011   Pulmonary embolism ([facility])    Radiation    Brain tumor   UTI (urinary tract infection)    Vitamin D deficiency     Past Surgical History:  Procedure Laterality Date   CHOLECYSTECTOMY  1999   CYSTOSCOPY W/ RETROGRADES Left 03/01/2018   Procedure: CYSTOSCOPY WITH RETROGRADE PYELOGRAM;  Surgeon: Abbie Sons, MD;  Location: [facility];  Service: Urology;  Laterality: Left;   CYSTOSCOPY W/ URETERAL STENT PLACEMENT Right 04/08/2017   Procedure: CYSTOSCOPY WITH RETROGRADE PYELOGRAM/URETERAL STENT PLACEMENT;  Surgeon: Rana Snare, MD;  Location: [facility];  Service: Urology;  Laterality: Right;   CYSTOSCOPY W/ URETERAL STENT PLACEMENT Left 03/12/2018   Procedure: CYSTOSCOPY WITH STENT REPLACEMENT;  Surgeon: Abbie Sons, MD;  Location: [facility];  Service: Urology;  Laterality: Left;   CYSTOSCOPY W/ URETEROSCOPY     CYSTOSCOPY WITH RETROGRADE PYELOGRAM, URETEROSCOPY AND STENT PLACEMENT Left 01/20/2014   Procedure: LEFT URETEROSCOPY WITH STENT PLACEMENT;  Surgeon: Irine Seal,  MD;  [facility];  Service: Urology;  Laterality: Left;   CYSTOSCOPY WITH RETROGRADE PYELOGRAM, URETEROSCOPY AND STENT PLACEMENT Bilateral 04/23/2015   Procedure: CYSTOSCOPY WITH BILATERAL RETROGRADE PYELOGRAM, URETEROSCOPY AND STENT PLACEMENT;  Surgeon: Alexis Frock, MD;  [facility];  Service: Urology;  Laterality: Bilateral;   CYSTOSCOPY WITH RETROGRADE PYELOGRAM, URETEROSCOPY AND STENT PLACEMENT Right 04/13/2017   Procedure: CYSTOSCOPY WITH RETROGRADE PYELOGRAM, URETEROSCOPY AND STENT EXCHANGE;  Surgeon: Alexis Frock, MD;  [facility];  Service: Urology;  Laterality: Right;   CYSTOSCOPY WITH STENT PLACEMENT Left 01/12/2014   Procedure: CYSTOSCOPY WITH STENT PLACEMENT left retrograde;  Surgeon: Irine Seal, MD;  [facility];  Service: Urology;  Laterality: Left;   CYSTOSCOPY WITH STENT PLACEMENT Left 03/01/2018   Procedure: CYSTOSCOPY WITH STENT PLACEMENT;  Surgeon: Abbie Sons, MD;  Location: [facility];  Service: Urology;  Laterality: Left;   CYSTOSCOPY/URETEROSCOPY/HOLMIUM LASER/STENT PLACEMENT Left 03/12/2018   Procedure: CYSTOSCOPY/URETEROSCOPY/HOLMIUM LASER;  Surgeon: Abbie Sons, MD;  Location: [facility];  Service: Urology;  Laterality: Left;   CYSTOSCOPY/URETEROSCOPY/HOLMIUM LASER/STENT PLACEMENT Left 11/15/2021   Procedure: CYSTOSCOPY/URETEROSCOPY/HOLMIUM LASER/STENT PLACEMENT;  Surgeon: Abbie Sons, MD;  Location: [facility];  Service: Urology;  Laterality: Left;   HOLMIUM LASER APPLICATION Left 1/0/2725   Procedure: HOLMIUM LASER APPLICATION;  Surgeon: Irine Seal, MD;  [facility];  Service: Urology;  Laterality:  Left;   HOLMIUM LASER APPLICATION Bilateral 06/20/1286   Procedure: HOLMIUM LASER APPLICATION;  Surgeon: Alexis Frock, MD;  [facility];  Service: Urology;  Laterality: Bilateral;   HOLMIUM LASER APPLICATION Right 8/67/6720   Procedure: HOLMIUM LASER APPLICATION;  Surgeon: Alexis Frock, MD;  [facility];  Service: Urology;   Laterality: Right;   kidney stone removal     LITHOTRIPSY     PARATHYROIDECTOMY Right 11/13/2016   Procedure: RIGHT SUPERIOR PARATHYROIDECTOMY;  Surgeon: Armandina Gemma, MD;  Location: [place];  Service: General;  Laterality: Right;   surgery for,ectopic pregnancy  2011    1 fallopian tube rupture   TUBAL LIGATION  2011    Social History   Socioeconomic History   Marital status: Divorced    Spouse name: Not on file   Number of children: 4   Years of education: Not on file   Highest education level: Some college, no degree  Occupational History   Not on file  Tobacco Use   Smoking status: Never   Smokeless tobacco: Never  Vaping Use   Vaping Use: Never used  Substance and Sexual Activity   Alcohol use: Not Currently   Drug use: Yes    Types: Marijuana   Sexual activity: Not Currently  Other Topics Concern   Not on file  Social History Narrative   Lives at home with her son, independent at baselinepend   Social Determinants of Health   Financial Resource Strain: Not on file  Food Insecurity: Not on file  Transportation Needs: Not on file  Physical Activity: Not on file  Stress: Not on file  Social Connections: Not on file  Intimate Partner Violence: Not on file    Family History  Problem Relation Age of Onset   Bell's palsy Mother    Heart failure Mother    Hypertension Mother    Lupus Mother    Anxiety disorder Mother    Depression Mother    Stroke Mother    Asthma Father    Hypertension Father    Hyperlipidemia Father    Anxiety disorder Father    Depression Father    Hypertension Maternal Grandmother    Diabetes Maternal Grandmother    Sarcoidosis Maternal Grandmother    Anxiety disorder Sister    Depression Sister    Bipolar disorder Sister    Depression Son    Bipolar disorder Son      Current Outpatient Medications:    albuterol (PROVENTIL HFA;VENTOLIN HFA) 108 (90 Base) MCG/ACT inhaler, Inhale 2 puffs into the lungs every 6 (six) hours as needed  for wheezing or shortness of breath., Disp: 1 Inhaler, Rfl: 2   albuterol (PROVENTIL) (2.5 MG/3ML) 0.083% nebulizer solution, Take 3 mLs (2.5 mg total) by nebulization every 6 (six) hours as needed for wheezing or shortness of breath., Disp: 75 mL, Rfl: 12   dasatinib (SPRYCEL) 100 MG tablet, Take 1 tablet (100 mg total) by mouth daily., Disp: 30 tablet, Rfl: 0   EPINEPHrine 0.3 mg/0.3 mL IJ SOAJ injection, Inject into the muscle., Disp: , Rfl:   CT BONE MARROW BIOPSY & ASPIRATION  Result Date: 01/25/2022 INDICATION: MDS EXAM: CT BONE MARROW BIOPSY AND ASPIRATION MEDICATIONS: None. ANESTHESIA/SEDATION: Moderate (conscious) sedation was employed during this procedure. A total of Versed 2 mg and Fentanyl 100 mcg was administered intravenously. Moderate Sedation Time: 14 minutes. The Carol Ortega's level of consciousness and vital signs were monitored continuously by radiology nursing throughout the procedure under my direct supervision. FLUOROSCOPY TIME:  N/a COMPLICATIONS: None immediate. PROCEDURE: Informed written consent was obtained from the Carol Ortega after a thorough discussion of the procedural risks, benefits and alternatives. All questions were addressed. Maximal Sterile Barrier Technique was utilized including caps, mask, sterile gowns, sterile gloves, sterile drape, hand hygiene and skin antiseptic. A timeout was performed prior to the initiation of the procedure. The Carol Ortega was placed prone on the CT exam table. Limited CT of the pelvis was performed for planning purposes. Skin entry site was marked, and the overlying skin was prepped and draped in the standard sterile fashion. Local analgesia was obtained with 1% lidocaine. Using CT guidance, an 11 gauge needle was advanced just deep to the cortex of the left posterior ilium. Subsequently, bone marrow aspiration and core biopsy were performed. Specimens were submitted to lab/pathology for handling. A second core biopsy was obtained at the request of  pathology. Hemostasis was achieved with manual pressure, and a clean dressing was placed. The Carol Ortega tolerated the procedure well without immediate complication. IMPRESSION: Successful CT-guided bone marrow aspiration and core biopsy of the left posterior ilium. Electronically Signed   By: Albin Felling M.D.   On: 01/25/2022 11:05    No images are attached to the encounter.   CMP Latest Ref Rng & Units 01/20/2022  Glucose 70 - 99 mg/dL 97  BUN 6 - 20 mg/dL 10  Creatinine 0.44 - 1.00 mg/dL 0.69  Sodium 135 - 145 mmol/L 136  Potassium 3.5 - 5.1 mmol/L 3.7  Chloride 98 - 111 mmol/L 105  CO2 22 - 32 mmol/L 27  Calcium 8.9 - 10.3 mg/dL 8.6(L)  Total Protein 6.5 - 8.1 g/dL 7.6  Total Bilirubin 0.3 - 1.2 mg/dL 0.5  Alkaline Phos 38 - 126 U/L 80  AST 15 - 41 U/L 17  ALT 0 - 44 U/L 15   CBC Latest Ref Rng & Units 01/25/2022  WBC 4.0 - 10.5 K/uL 4.0  Hemoglobin 12.0 - 15.0 g/dL 10.4(L)  Hematocrit 36.0 - 46.0 % 34.3(L)  Platelets 150 - 400 K/uL 268     Observation/objective: Appears in no acute distress over video visit today.  Breathing is nonlabored  Assessment and plan: Carol Ortega is a 51 year old female with history of chronic phase CML on Dasatinib here for routine follow-up.  Carol Ortega is now roughly 2 monthsInto starting Dasatinib.  BCR ABL FISH testing shows decrease in CML clone from 100% to 49%.  B2 A2 transcript which was at 847.31 is now down to 81.  Overall she has responded well to Dasatinib white cell count which was previously elevated at 57 and is now normalized to 4.  Platelet counts are normal.  She does have some baseline anemia and her hemoglobin has drifted down to 10.4.  Possibly secondary to Dasatinib.  Continue to monitor.  I will see her back in 1 month with CBC with differential CMP BCR ABL PCR and BCR ABL FISH testing baseline TSH is normal and EKG does not show any prolonged QTc.  Follow-up instructions: As above  I discussed the assessment and treatment plan with the  Carol Ortega. The Carol Ortega was provided an opportunity to ask questions and all were answered. The Carol Ortega agreed with the plan and demonstrated an understanding of the instructions.   The Carol Ortega was advised to call back or seek an in-person evaluation if the symptoms worsen or if the condition fails to improve as anticipated.  Visit Diagnosis: 1. CML (chronic myelocytic leukemia) ([place])   2. High risk medication use  Dr. Randa Evens, MD, MPH [place] [facility] Tel- [phone] 02/12/2022 1:14 PM

## 2022-02-14 ENCOUNTER — Other Ambulatory Visit (HOSPITAL_COMMUNITY): Payer: Self-pay

## 2022-02-14 ENCOUNTER — Other Ambulatory Visit: Payer: Self-pay | Admitting: Pharmacist

## 2022-02-14 DIAGNOSIS — C9210 Chronic myeloid leukemia, BCR/ABL-positive, not having achieved remission: Secondary | ICD-10-CM

## 2022-02-17 ENCOUNTER — Other Ambulatory Visit (HOSPITAL_COMMUNITY): Payer: Self-pay

## 2022-02-17 MED ORDER — DASATINIB 100 MG PO TABS
100.0000 mg | ORAL_TABLET | Freq: Every day | ORAL | 1 refills | Status: DC
  Filled 2022-02-17 – 2022-02-20 (×2): qty 30, 30d supply, fill #0
  Filled 2022-03-20: qty 30, 30d supply, fill #1

## 2022-02-20 ENCOUNTER — Other Ambulatory Visit: Payer: Self-pay

## 2022-02-20 ENCOUNTER — Other Ambulatory Visit (HOSPITAL_COMMUNITY): Payer: Self-pay

## 2022-02-24 ENCOUNTER — Encounter: Payer: Self-pay | Admitting: Urology

## 2022-03-03 DIAGNOSIS — F330 Major depressive disorder, recurrent, mild: Secondary | ICD-10-CM | POA: Diagnosis not present

## 2022-03-03 DIAGNOSIS — F4312 Post-traumatic stress disorder, chronic: Secondary | ICD-10-CM | POA: Diagnosis not present

## 2022-03-09 ENCOUNTER — Encounter: Payer: Self-pay | Admitting: Pharmacist

## 2022-03-09 DIAGNOSIS — N912 Amenorrhea, unspecified: Secondary | ICD-10-CM | POA: Diagnosis not present

## 2022-03-09 DIAGNOSIS — R011 Cardiac murmur, unspecified: Secondary | ICD-10-CM | POA: Diagnosis not present

## 2022-03-09 DIAGNOSIS — R7303 Prediabetes: Secondary | ICD-10-CM | POA: Diagnosis not present

## 2022-03-09 DIAGNOSIS — Z1231 Encounter for screening mammogram for malignant neoplasm of breast: Secondary | ICD-10-CM | POA: Diagnosis not present

## 2022-03-09 DIAGNOSIS — E785 Hyperlipidemia, unspecified: Secondary | ICD-10-CM | POA: Diagnosis not present

## 2022-03-09 DIAGNOSIS — I10 Essential (primary) hypertension: Secondary | ICD-10-CM | POA: Diagnosis not present

## 2022-03-09 DIAGNOSIS — F32A Depression, unspecified: Secondary | ICD-10-CM | POA: Diagnosis not present

## 2022-03-09 DIAGNOSIS — F411 Generalized anxiety disorder: Secondary | ICD-10-CM | POA: Diagnosis not present

## 2022-03-09 DIAGNOSIS — R21 Rash and other nonspecific skin eruption: Secondary | ICD-10-CM | POA: Diagnosis not present

## 2022-03-09 DIAGNOSIS — R7989 Other specified abnormal findings of blood chemistry: Secondary | ICD-10-CM | POA: Diagnosis not present

## 2022-03-13 ENCOUNTER — Other Ambulatory Visit: Payer: Self-pay | Admitting: Nurse Practitioner

## 2022-03-13 DIAGNOSIS — Z1231 Encounter for screening mammogram for malignant neoplasm of breast: Secondary | ICD-10-CM

## 2022-03-14 ENCOUNTER — Other Ambulatory Visit: Payer: Self-pay

## 2022-03-14 ENCOUNTER — Encounter: Payer: Self-pay | Admitting: Oncology

## 2022-03-14 ENCOUNTER — Inpatient Hospital Stay: Payer: Medicaid Other | Attending: Oncology

## 2022-03-14 ENCOUNTER — Inpatient Hospital Stay (HOSPITAL_BASED_OUTPATIENT_CLINIC_OR_DEPARTMENT_OTHER): Payer: Medicaid Other | Admitting: Oncology

## 2022-03-14 VITALS — BP 130/83 | HR 83 | Temp 96.9°F | Resp 16 | Ht 66.0 in | Wt 323.0 lb

## 2022-03-14 DIAGNOSIS — C9210 Chronic myeloid leukemia, BCR/ABL-positive, not having achieved remission: Secondary | ICD-10-CM

## 2022-03-14 DIAGNOSIS — Z79899 Other long term (current) drug therapy: Secondary | ICD-10-CM | POA: Diagnosis not present

## 2022-03-14 LAB — CBC WITH DIFFERENTIAL/PLATELET
Abs Immature Granulocytes: 0.01 10*3/uL (ref 0.00–0.07)
Basophils Absolute: 0 10*3/uL (ref 0.0–0.1)
Basophils Relative: 1 %
Eosinophils Absolute: 0.2 10*3/uL (ref 0.0–0.5)
Eosinophils Relative: 3 %
HCT: 36.2 % (ref 36.0–46.0)
Hemoglobin: 11 g/dL — ABNORMAL LOW (ref 12.0–15.0)
Immature Granulocytes: 0 %
Lymphocytes Relative: 40 %
Lymphs Abs: 2.5 10*3/uL (ref 0.7–4.0)
MCH: 27.8 pg (ref 26.0–34.0)
MCHC: 30.4 g/dL (ref 30.0–36.0)
MCV: 91.6 fL (ref 80.0–100.0)
Monocytes Absolute: 0.7 10*3/uL (ref 0.1–1.0)
Monocytes Relative: 11 %
Neutro Abs: 2.8 10*3/uL (ref 1.7–7.7)
Neutrophils Relative %: 45 %
Platelets: 282 10*3/uL (ref 150–400)
RBC: 3.95 MIL/uL (ref 3.87–5.11)
RDW: 16.5 % — ABNORMAL HIGH (ref 11.5–15.5)
WBC: 6.2 10*3/uL (ref 4.0–10.5)
nRBC: 0 % (ref 0.0–0.2)

## 2022-03-14 LAB — COMPREHENSIVE METABOLIC PANEL
ALT: 11 U/L (ref 0–44)
AST: 15 U/L (ref 15–41)
Albumin: 3.5 g/dL (ref 3.5–5.0)
Alkaline Phosphatase: 80 U/L (ref 38–126)
Anion gap: 5 (ref 5–15)
BUN: 12 mg/dL (ref 6–20)
CO2: 29 mmol/L (ref 22–32)
Calcium: 8.6 mg/dL — ABNORMAL LOW (ref 8.9–10.3)
Chloride: 104 mmol/L (ref 98–111)
Creatinine, Ser: 0.8 mg/dL (ref 0.44–1.00)
GFR, Estimated: >60 mL/min (ref >60)
Glucose, Bld: 104 mg/dL — ABNORMAL HIGH (ref 70–99)
Potassium: 3.7 mmol/L (ref 3.5–5.1)
Sodium: 138 mmol/L (ref 135–145)
Total Bilirubin: 0.3 mg/dL (ref 0.3–1.2)
Total Protein: 7.2 g/dL (ref 6.5–8.1)

## 2022-03-14 NOTE — Telephone Encounter (Signed)
Carol Ortega we can order the echo

## 2022-03-14 NOTE — Progress Notes (Signed)
? ? ? ?Hematology/Oncology Consult note ?[place]  ?[phone] [phone] ? ?Patient Care Team: ?Berkley Harvey, NP as PCP - General (Nurse Practitioner)  ? ?Name of the patient: Carol Ortega  ?[id number]  ?1971/09/11  ? ?Date of visit: 03/14/22 ? ?Diagnosis-chronic phase CML on Dasatinib ? ?Chief complaint/ Reason for visit-routine follow-up of CML on Dasatinib ? ?Heme/Onc history: patient is a 51 year old African-American femaleWho presents to the ER with symptoms of left-sided flank pain.  CT abdomen and pelvis without contrast showed moderate left hydronephrosis secondary to a 1.1 x 0.9 x 1.3 cm calculus at the left ureteropelvic junction/proximal left ureter.  No other evidence of adenopathy and spleen appeared normal.  Incidentally patient was noted to have a white cell count of 138 which on repeat check was 133.  Platelet counts normal and hemoglobin was 12.1.  Last normal CBC was in April 2021 when white count was 7.2.  Presently differential mainly shows neutrophilia along with lymphocytosis monocytosis basophilia and eosinophilia.  Immature granulocytes were seen.  Pathology smear review shows findings concerning for myeloproliferative neoplasm.  Blasts possibly less than 20% and BCR ABL testing was recommended.Patient noted to have wbc of 18 in may 2022 again with predominant left shift. ?  ?BCR ABL FISH testing showed 100% nuclei positive for BCR-ABL gene fusion signals.  BCR ABL PCR testing showed B2 A2 transcript 847.313 and B3 A2 transcript less than 0.0032%.  E1 A2 transcript 0.5645%.  Peripheral blood flow cytometry showedAberrant myeloblast population about 2% of the leukocytes.  Absolute neutrophilia eosinophilia and basophilia.  Patient started Dasatinib in early December 2022. Bone marrow biopsy on 01/25/2021 showed hypercellular bone marrow with pan myeloid proliferation.  No increased number of blasts seen. ? ?Interval history-patient is tolerating  Dasatinib well and other than chronic fatigue denies other complaints at this time ? ?ECOG PS- 1 ?Pain scale- 0 ? ? ?Review of systems- Review of Systems  ?Constitutional:  Positive for malaise/fatigue. Negative for chills, fever and weight loss.  ?HENT:  Negative for congestion, ear discharge and nosebleeds.   ?Eyes:  Negative for blurred vision.  ?Respiratory:  Negative for cough, hemoptysis, sputum production, shortness of breath and wheezing.   ?Cardiovascular:  Negative for chest pain, palpitations, orthopnea and claudication.  ?Gastrointestinal:  Negative for abdominal pain, blood in stool, constipation, diarrhea, heartburn, melena, nausea and vomiting.  ?Genitourinary:  Negative for dysuria, flank pain, frequency, hematuria and urgency.  ?Musculoskeletal:  Negative for back pain, joint pain and myalgias.  ?Skin:  Negative for rash.  ?Neurological:  Negative for dizziness, tingling, focal weakness, seizures, weakness and headaches.  ?Endo/Heme/Allergies:  Does not bruise/bleed easily.  ?Psychiatric/Behavioral:  Negative for depression and suicidal ideas. The patient does not have insomnia.    ? ? ? ?Allergies  ?Allergen Reactions  ? Bee Venom Anaphylaxis  ? Contrast Media [Iodinated Contrast Media] Anaphylaxis  ? Eggs Or Egg-Derived Products Anaphylaxis  ? Other Anaphylaxis  ?  Surgical dye.. Pt states ok to take if previously taken benadryl or prednisone  ? Penicillins Hives and Other (See Comments)  ?  Has patient had a PCN reaction causing immediate rash, facial/tongue/throat swelling, SOB or lightheadedness with hypotension: No ?Has patient had a PCN reaction causing severe rash involving mucus membranes or skin necrosis: No ?Has patient had a PCN reaction that required hospitalization No ?Has patient had a PCN reaction occurring within the last 10 years: No ?If all of the above answers are "NO", then may proceed with  Cephalosporin use.  ? Wellbutrin [Bupropion] Other (See Comments)  ?  irritiability  ?  Latex Rash  ? Oxycodone-Acetaminophen Itching  ? ? ? ?Past Medical History:  ?Diagnosis Date  ? Anemia   ? Anxiety   ? Asthma   ? seasonal  ? Benign brain tumor ([place])   ? Brain tumor [facility]) 2014  ? tx with radiation  ? CML (chronic myelocytic leukemia) ([place]) 11/21/2021  ? Complication of anesthesia 1999  ? lung collapse after surgery for gallbladder  ? Depression   ? Ectopic pregnancy   ? Headache   ? migraines  ? Hoarseness of voice   ? Hypertension   ? Kidney stone   ? Obesity   ? Parathyroid abnormality ([place])   ? Pulmonary emboli ([place]) 2011  ? Pulmonary embolism ([place])   ? Radiation   ? Brain tumor  ? UTI (urinary tract infection)   ? Vitamin D deficiency   ? ? ? ?Past Surgical History:  ?Procedure Laterality Date  ? CHOLECYSTECTOMY  1999  ? CYSTOSCOPY W/ RETROGRADES Left 03/01/2018  ? Procedure: CYSTOSCOPY WITH RETROGRADE PYELOGRAM;  Surgeon: Abbie Sons, MD;  Location: [facility];  Service: Urology;  Laterality: Left;  ? CYSTOSCOPY W/ URETERAL STENT PLACEMENT Right 04/08/2017  ? Procedure: CYSTOSCOPY WITH RETROGRADE PYELOGRAM/URETERAL STENT PLACEMENT;  Surgeon: Rana Snare, MD;  [facility];  Service: Urology;  Laterality: Right;  ? CYSTOSCOPY W/ URETERAL STENT PLACEMENT Left 03/12/2018  ? Procedure: CYSTOSCOPY WITH STENT REPLACEMENT;  Surgeon: Abbie Sons, MD;  Location: [facility];  Service: Urology;  Laterality: Left;  ? CYSTOSCOPY W/ URETEROSCOPY    ? CYSTOSCOPY WITH RETROGRADE PYELOGRAM, URETEROSCOPY AND STENT PLACEMENT Left 01/20/2014  ? Procedure: LEFT URETEROSCOPY WITH STENT PLACEMENT;  Surgeon: Irine Seal, MD;  [facility];  Service: Urology;  Laterality: Left;  ? CYSTOSCOPY WITH RETROGRADE PYELOGRAM, URETEROSCOPY AND STENT PLACEMENT Bilateral 04/23/2015  ? Procedure: CYSTOSCOPY WITH BILATERAL RETROGRADE PYELOGRAM, URETEROSCOPY AND STENT PLACEMENT;  Surgeon: Alexis Frock, MD;  [facility];  Service: Urology;  Laterality: Bilateral;  ? CYSTOSCOPY WITH RETROGRADE PYELOGRAM, URETEROSCOPY AND  STENT PLACEMENT Right 04/13/2017  ? Procedure: CYSTOSCOPY WITH RETROGRADE PYELOGRAM, URETEROSCOPY AND STENT EXCHANGE;  Surgeon: Alexis Frock, MD;  [facility];  Service: Urology;  Laterality: Right;  ? CYSTOSCOPY WITH STENT PLACEMENT Left 01/12/2014  ? Procedure: CYSTOSCOPY WITH STENT PLACEMENT left retrograde;  Surgeon: Irine Seal, MD;  [facility];  Service: Urology;  Laterality: Left;  ? CYSTOSCOPY WITH STENT PLACEMENT Left 03/01/2018  ? Procedure: CYSTOSCOPY WITH STENT PLACEMENT;  Surgeon: Abbie Sons, MD;  Location: [facility];  Service: Urology;  Laterality: Left;  ? CYSTOSCOPY/URETEROSCOPY/HOLMIUM LASER/STENT PLACEMENT Left 03/12/2018  ? Procedure: CYSTOSCOPY/URETEROSCOPY/HOLMIUM LASER;  Surgeon: Abbie Sons, MD;  Location: [facility];  Service: Urology;  Laterality: Left;  ? CYSTOSCOPY/URETEROSCOPY/HOLMIUM LASER/STENT PLACEMENT Left 11/15/2021  ? Procedure: CYSTOSCOPY/URETEROSCOPY/HOLMIUM LASER/STENT PLACEMENT;  Surgeon: Abbie Sons, MD;  Location: [facility];  Service: Urology;  Laterality: Left;  ? HOLMIUM LASER APPLICATION Left 03/23/8591  ? Procedure: HOLMIUM LASER APPLICATION;  Surgeon: Irine Seal, MD;  [facility];  Service: Urology;  Laterality: Left;  ? HOLMIUM LASER APPLICATION Bilateral 08/19/4461  ? Procedure: HOLMIUM LASER APPLICATION;  Surgeon: Alexis Frock, MD;  [facility];  Service: Urology;  Laterality: Bilateral;  ? HOLMIUM LASER APPLICATION Right 8/63/8177  ? Procedure: HOLMIUM LASER APPLICATION;  Surgeon: Alexis Frock, MD;  [facility];  Service: Urology;  Laterality: Right;  ? kidney stone  removal    ? LITHOTRIPSY    ? PARATHYROIDECTOMY Right 11/13/2016  ? Procedure: RIGHT SUPERIOR PARATHYROIDECTOMY;  Surgeon: Armandina Gemma, MD;  Location: [place];  Service: General;  Laterality: Right;  ? surgery for,ectopic pregnancy  2011  ?  1 fallopian tube rupture  ? TUBAL LIGATION  2011  ? ? ?Social History  ? ?Socioeconomic History  ? Marital status: Divorced  ?   Spouse name: Not on file  ? Number of children: 4  ? Years of education: Not on file  ? Highest education level: Some college, no degree  ?Occupational History  ? Not on file  ?Tobacco Use  ? Smoking status

## 2022-03-16 ENCOUNTER — Other Ambulatory Visit (HOSPITAL_COMMUNITY): Payer: Self-pay

## 2022-03-17 LAB — BCR-ABL1 FISH
Cells Analyzed: 200
Cells Counted: 200

## 2022-03-18 ENCOUNTER — Emergency Department: Payer: Medicaid Other

## 2022-03-18 ENCOUNTER — Emergency Department
Admission: EM | Admit: 2022-03-18 | Discharge: 2022-03-18 | Disposition: A | Discharge status: Home / Self Care | Payer: Medicaid Other | Attending: Emergency Medicine | Admitting: Emergency Medicine

## 2022-03-18 ENCOUNTER — Other Ambulatory Visit: Payer: Self-pay

## 2022-03-18 DIAGNOSIS — I10 Essential (primary) hypertension: Secondary | ICD-10-CM | POA: Diagnosis not present

## 2022-03-18 DIAGNOSIS — J45909 Unspecified asthma, uncomplicated: Secondary | ICD-10-CM | POA: Diagnosis not present

## 2022-03-18 DIAGNOSIS — R079 Chest pain, unspecified: Secondary | ICD-10-CM | POA: Diagnosis not present

## 2022-03-18 DIAGNOSIS — R0789 Other chest pain: Secondary | ICD-10-CM | POA: Diagnosis not present

## 2022-03-18 DIAGNOSIS — R0600 Dyspnea, unspecified: Secondary | ICD-10-CM | POA: Diagnosis not present

## 2022-03-18 DIAGNOSIS — R0609 Other forms of dyspnea: Secondary | ICD-10-CM | POA: Diagnosis not present

## 2022-03-18 DIAGNOSIS — R918 Other nonspecific abnormal finding of lung field: Secondary | ICD-10-CM | POA: Diagnosis not present

## 2022-03-18 DIAGNOSIS — R0602 Shortness of breath: Secondary | ICD-10-CM | POA: Diagnosis not present

## 2022-03-18 LAB — BASIC METABOLIC PANEL
Anion gap: 7 (ref 5–15)
BUN: 10 mg/dL (ref 6–20)
CO2: 30 mmol/L (ref 22–32)
Calcium: 9.2 mg/dL (ref 8.9–10.3)
Chloride: 103 mmol/L (ref 98–111)
Creatinine, Ser: 0.66 mg/dL (ref 0.44–1.00)
GFR, Estimated: >60 mL/min (ref >60)
Glucose, Bld: 114 mg/dL — ABNORMAL HIGH (ref 70–99)
Potassium: 3.7 mmol/L (ref 3.5–5.1)
Sodium: 140 mmol/L (ref 135–145)

## 2022-03-18 LAB — CBC
HCT: 38.7 % (ref 36.0–46.0)
Hemoglobin: 11.6 g/dL — ABNORMAL LOW (ref 12.0–15.0)
MCH: 26.9 pg (ref 26.0–34.0)
MCHC: 30 g/dL (ref 30.0–36.0)
MCV: 89.8 fL (ref 80.0–100.0)
Platelets: 266 10*3/uL (ref 150–400)
RBC: 4.31 MIL/uL (ref 3.87–5.11)
RDW: 16.3 % — ABNORMAL HIGH (ref 11.5–15.5)
WBC: 8.1 10*3/uL (ref 4.0–10.5)
nRBC: 0 % (ref 0.0–0.2)

## 2022-03-18 LAB — TROPONIN I (HIGH SENSITIVITY)
Troponin I (High Sensitivity): 4 ng/L (ref <18)
Troponin I (High Sensitivity): 4 ng/L (ref <18)

## 2022-03-18 LAB — D-DIMER, QUANTITATIVE: D-Dimer, Quant: 0.44 ug/mL-FEU (ref 0.00–0.50)

## 2022-03-18 MED ORDER — DIPHENHYDRAMINE HCL 25 MG PO CAPS
50.0000 mg | ORAL_CAPSULE | Freq: Once | ORAL | Status: AC
  Administered 2022-03-18: 50 mg via ORAL
  Filled 2022-03-18: qty 2

## 2022-03-18 MED ORDER — IOHEXOL 350 MG/ML SOLN
100.0000 mL | Freq: Once | INTRAVENOUS | Status: AC | PRN
  Administered 2022-03-18: 100 mL via INTRAVENOUS

## 2022-03-18 MED ORDER — FLUCONAZOLE 150 MG PO TABS: 150.0000 mg | ORAL_TABLET | Freq: Once | ORAL | 0 refills | Status: DC | PRN

## 2022-03-18 MED ORDER — DIPHENHYDRAMINE HCL 50 MG/ML IJ SOLN
50.0000 mg | Freq: Once | INTRAMUSCULAR | Status: AC
  Filled 2022-03-18: qty 1

## 2022-03-18 MED ORDER — HYDROCORTISONE SOD SUC (PF) 250 MG IJ SOLR
200.0000 mg | Freq: Once | INTRAMUSCULAR | Status: AC
  Administered 2022-03-18: 200 mg via INTRAVENOUS
  Filled 2022-03-18: qty 200

## 2022-03-18 MED ORDER — AZITHROMYCIN 250 MG PO TABS: ORAL_TABLET | ORAL | 0 refills | Status: DC

## 2022-03-18 MED ORDER — AZITHROMYCIN 500 MG PO TABS
500.0000 mg | ORAL_TABLET | Freq: Once | ORAL | Status: AC
  Administered 2022-03-18: 500 mg via ORAL
  Filled 2022-03-18: qty 1

## 2022-03-18 NOTE — ED Provider Notes (Signed)
DG Chest 2 View ? ?Result Date: 03/18/2022 ?CLINICAL DATA:  Chest pain, shortness of breath EXAM: CHEST - 2 VIEW COMPARISON:  11/08/2021 FINDINGS: Heart and mediastinal contours are within normal limits. No focal opacities or effusions. No acute bony abnormality. IMPRESSION: No active cardiopulmonary disease. Electronically Signed   By: Rolm Baptise M.D.   On: 03/18/2022 01:10  ? ?CT Angio Chest PE W and/or Wo Contrast ? ?Result Date: 03/18/2022 ?CLINICAL DATA:  Chest pain and shortness of breath. Pulmonary embolus suspected. EXAM: CT ANGIOGRAPHY CHEST WITH CONTRAST TECHNIQUE: Multidetector CT imaging of the chest was performed using the standard protocol during bolus administration of intravenous contrast. Multiplanar CT image reconstructions and MIPs were obtained to evaluate the vascular anatomy. RADIATION DOSE REDUCTION: This exam was performed according to the departmental dose-optimization program which includes automated exposure control, adjustment of the mA and/or kV according to patient size and/or use of iterative reconstruction technique. CONTRAST:  193m OMNIPAQUE IOHEXOL 350 MG/ML SOLN COMPARISON:  04/03/2018 FINDINGS: Cardiovascular: The heart size is normal. No substantial pericardial effusion. No thoracic aortic aneurysm. There is no filling defect within the opacified pulmonary arteries to suggest the presence of an acute pulmonary embolus. Mediastinum/Nodes: No mediastinal lymphadenopathy. There is no hilar lymphadenopathy. The esophagus has normal imaging features. There is no axillary lymphadenopathy. Lungs/Pleura: Stable 3 mm right middle lobe pulmonary nodule 54/6). No new suspicious pulmonary nodule or mass. Scattered clustered areas of micro nodularity are identified bilaterally, including the lung apices (image 13/6), left upper lobe (39/6) and left lower lobe (59/6). No focal airspace consolidation. No pleural effusion. Upper Abdomen: Unremarkable. Musculoskeletal: No worrisome lytic or  sclerotic osseous abnormality. Review of the MIP images confirms the above findings. IMPRESSION: 1. No CT evidence for acute pulmonary embolus. 2. Scattered clustered areas of micro nodularity in both lungs. Imaging features suggest atypical infection. Electronically Signed   By: EMisty StanleyM.D.   On: 03/18/2022 07:33   ? ? ?Vitals:  ? 03/18/22 0700 03/18/22 0730  ?BP: (!) 134/94 132/77  ?Pulse: 73 84  ?Resp: 18 17  ?Temp:    ?SpO2: 98% 100%  ? ? ?CT imaging reviewed, negative for acute PE.  Clusters of possible micronodularity in both lungs, etiologies finding may be suggestive of atypical infection.  Do not see contraindication to use of azithromycin, QTc is normal on her EKG. ? ? ?----------------------------------------- ?7:53 AM on 03/18/2022 ?----------------------------------------- ?Patient reports no itching or adverse reaction from contrast.  She is resting comfortably.  Reviewed her CT findings with her and after discussion it would seem reasonable to treat her with azithromycin for potential atypical infection.  She does not have any fever or leukocytosis.  Hemodynamics are reassuring.  She is resting comfortably at this time.  We will treat with azithromycin, and discussed careful return precautions and also recommended she follow-up closely with Dr. RJanese Banksand or her primary care doctor this coming week ? ?Return precautions and treatment recommendations and follow-up discussed with the patient who is agreeable with the plan. ? ?  ?QDelman Kitten MD ?03/18/22 [phone]? ?

## 2022-03-18 NOTE — ED Notes (Signed)
D/C and reasons to return to ED discussed with pt, pt verbalized understanding.  ? ?MD aware of request for diflucan due to pt's HX of yeast infections with ABX.  ? ?Pt ambulatory with steady gait on D/C.  ?

## 2022-03-18 NOTE — ED Triage Notes (Signed)
Pt states left sided chest pain that radiates underneath her breast with associated shob since yesterday. Pt denies fever, cough. Pt states shob is worse when ambulating or "moving around".  ?

## 2022-03-18 NOTE — ED Provider Notes (Signed)
? ?[facility] ?Provider Note ? ? ? Event Date/Time  ? First MD Initiated Contact with Patient 03/18/22 0150   ?  (approximate) ? ? ?History  ? ?Chest Pain and Shortness of Breath ? ? ?HPI ? ?Carol Ortega is a 51 y.o. 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  She does not believe she has any history of cardiac issues. ? ?She presents for evaluation of cute onset of chest pressure starting yesterday.  It waxes and wanes but has not really gone away.  She said that she has shortness of breath as well that is much worse with exertion.  She says she cannot walk across the room without feeling winded.  The chest pressure/pain is on the left side of her chest underneath the breast but is nonradiating.  She has not felt sick recently except that she has had a headache mostly in the front of her head but also sometimes in the back.  She has a history of meningioma as well.  She denies recent fever, nausea, vomiting, and abdominal pain. ?  ?Dr. Janese Banks is her oncologist. ? ?Physical Exam  ? ?Triage Vital Signs: ?ED Triage Vitals  ?Enc Vitals Group  ?   BP 03/18/22 0030 (!) 151/95  ?   Pulse Rate 03/18/22 0030 96  ?   Resp 03/18/22 0026 (!) 22  ?   Temp 03/18/22 0030 98.5 ?F (36.9 ?C)  ?   Temp Source 03/18/22 0030 Oral  ?   SpO2 03/18/22 0030 96 %  ?   Weight 03/18/22 0026 (!) 144.7 kg (319 lb)  ?   Height 03/18/22 0026 1.676 m ('5\' 6"'$ )  ?   Head Circumference --   ?   Peak Flow --   ?   Pain Score 03/18/22 0026 5  ?   Pain Loc --   ?   Pain Edu? --   ?   Excl. in [place]? --   ? ? ?Most recent vital signs: ?Vitals:  ? 03/18/22 0605 03/18/22 0700  ?BP: 117/81 (!) 134/94  ?Pulse: 82 73  ?Resp: 20 18  ?Temp: 98.1 ?F (36.7 ?C)   ?SpO2: 97% 98%  ? ? ? ?General: Awake, no distress.  ?CV:  Good peripheral perfusion.  Normal heart sounds. ?Resp:  Normal effort.   Lungs are clear to auscultation bilaterally.  No supplemental oxygen requirement with SPO2 100% on room air. ?Abd:  Morbid obesity.  No distention.  No tenderness to palpation. ?Other:  No reproducible left-sided chest wall tenderness. ? ? ?ED Results / Procedures / Treatments  ? ?Labs ?(all labs ordered are listed, but only abnormal results are displayed) ?Labs Reviewed  ?BASIC METABOLIC PANEL - Abnormal; Notable for the following components:  ?    Result Value  ? Glucose, Bld 114 (*)   ? All other components within normal limits  ?CBC - Abnormal; Notable for the following components:  ? Hemoglobin 11.6 (*)   ? RDW 16.3 (*)   ? All other components within normal limits  ?D-DIMER, QUANTITATIVE  ?TROPONIN I (HIGH SENSITIVITY)  ?TROPONIN I (HIGH SENSITIVITY)  ? ? ? ?EKG ? ?ED ECG REPORT ?IHinda Kehr, the attending physician, personally viewed and interpreted this ECG. ? ?Date: 03/18/2022 ?EKG Time: 00: 27 ?Rate: 82 ?Rhythm: normal sinus rhythm ?QRS Axis: normal ?Intervals: normal ?ST/T Wave abnormalities: normal ?Narrative  Interpretation: no evidence of acute ischemia ? ? ? ?RADIOLOGY ?CTA chest pending ? ? ? ?PROCEDURES: ? ?Critical Care performed: No ? ?.1-3 Lead EKG Interpretation ?Performed by: Hinda Kehr, MD ?Authorized by: Hinda Kehr, MD  ? ?  Interpretation: normal   ?  ECG rate:  82 ?  ECG rate assessment: normal   ?  Rhythm: sinus rhythm   ?  Ectopy: none   ?  Conduction: normal   ? ? ?MEDICATIONS ORDERED IN ED: ?Medications  ?iohexol (OMNIPAQUE) 350 MG/ML injection 100 mL (has no administration in time range)  ?hydrocortisone sodium succinate (SOLU-CORTEF) injection 200 mg (200 mg Intravenous Given 03/18/22 0244)  ?diphenhydrAMINE (BENADRYL) capsule 50 mg (50 mg Oral Given 03/18/22 0603)  ?  Or  ?diphenhydrAMINE (BENADRYL) injection 50 mg ( Intravenous See Alternative 03/18/22 0603)  ? ? ? ?IMPRESSION / MDM / ASSESSMENT AND PLAN / ED COURSE  ?I reviewed the triage vital signs and the nursing notes. ?              ?               ? ?Differential diagnosis includes, but is not limited to, PE, ACS, musculoskeletal pain, pneumonia, heart failure. ? ?The patient is on the cardiac monitor to evaluate for evidence of arrhythmia and/or significant heart rate changes. ? ?Patient is a signs are stable and within normal limits, normotensive, not hypoxemic, normal rate.  Afebrile.  I reviewed the patient's EKG and it shows no evidence of ischemia nor significant rhythm nor interval abnormality.  I also personally reviewed the patient's chest x-ray (2 view) and see no evidence of acute abnormality including but not limited to pneumonia, pneumothorax, and interstitial edema. ? ?Labs ordered in triage include CBC, BMP, high-sensitivity troponin, and D-dimer. ? ?Her CBC is within normal limits, basic metabolic panel is essentially normal, high-sensitivity troponin is within normal limits, and D-dimer is actually within normal limits. ? ?However, regarding the D-dimer, it does not really apply to her because she is inherently at high risk for thromboembolism; she has a history of DVT as well as PE, and she is on a chemotherapeutic agent for CML which may increase her risk of thromboembolism.  She is not anticoagulated.  Therefore I believe she is at high enough risk that we cannot use the D-dimer to rule out PE. ? ?Unfortunately patient confirms that she has allergic reaction to CT IV contrast.  She said that usually Benadryl is sufficient to prevent any reaction, but I cannot verify this in her medical records from [facility].  Under the circumstances, I think it is important to evaluate with a CTA chest but we will adhere to the recommended radiology protocol of hydrocortisone 100 mg IV and then the scan 4 hours later with a dose of Benadryl 50 mg IV or p.o. within an hour of the scan.  I explained this to her and explained that this will lead to a substantial delay.  She said that she understands. ? ?I doubt ACS at this point but  we will also repeat a troponin at the 2-hour interval as per protocol.  If everything is reassuring, the patient may be appropriate for outpatient follow-up with cardiology. ? ?Clinical Course as of 03/18/22 0713  ?Sat Mar 18, 2022  ?0712 Transferring ED care to Dr. Jacqualine Code to follow-up on CTA and to reassess the patient.  Second troponin was normal and patient has been stable during the IV contrast prep. [CF]  ?  ?  Clinical Course User Index ?[CF] Hinda Kehr, MD  ? ? ? ?FINAL CLINICAL IMPRESSION(S) / ED DIAGNOSES  ? ?Final diagnoses:  ?Exertional dyspnea  ?Chest pain, unspecified type  ? ? ? ?Rx / DC Orders  ? ?ED Discharge Orders   ? ? None  ? ?  ? ? ? ?Note:  This document was prepared using Dragon voice recognition software and may include unintentional dictation errors. ?  ?Hinda Kehr, MD ?03/18/22 [phone] ? ?

## 2022-03-18 NOTE — ED Notes (Signed)
Pt at CT

## 2022-03-18 NOTE — ED Provider Notes (Signed)
Patient requests a Diflucan tablet prescription in the event she needs as she does have a history of having vaginal candidiasis after use of some antibiotics.  I have sent this to her pharmacy ?  ?Delman Kitten, MD ?03/18/22 [phone] ? ?

## 2022-03-20 ENCOUNTER — Other Ambulatory Visit (HOSPITAL_COMMUNITY): Payer: Self-pay

## 2022-03-21 ENCOUNTER — Other Ambulatory Visit (HOSPITAL_COMMUNITY): Payer: Self-pay

## 2022-03-21 ENCOUNTER — Ambulatory Visit: Admission: RE | Admit: 2022-03-21 | Payer: Medicaid Other | Source: Ambulatory Visit

## 2022-03-23 LAB — BCR-ABL1, CML/ALL, PCR, QUANT: b2a2 transcript: 6.2063 %

## 2022-03-30 DIAGNOSIS — F330 Major depressive disorder, recurrent, mild: Secondary | ICD-10-CM | POA: Diagnosis not present

## 2022-03-30 DIAGNOSIS — F4312 Post-traumatic stress disorder, chronic: Secondary | ICD-10-CM | POA: Diagnosis not present

## 2022-03-31 DIAGNOSIS — I10 Essential (primary) hypertension: Secondary | ICD-10-CM | POA: Diagnosis not present

## 2022-04-07 ENCOUNTER — Other Ambulatory Visit (HOSPITAL_COMMUNITY): Payer: Self-pay

## 2022-04-10 ENCOUNTER — Other Ambulatory Visit: Payer: Self-pay | Admitting: Pharmacist

## 2022-04-10 ENCOUNTER — Other Ambulatory Visit (HOSPITAL_COMMUNITY): Payer: Self-pay

## 2022-04-10 DIAGNOSIS — C9210 Chronic myeloid leukemia, BCR/ABL-positive, not having achieved remission: Secondary | ICD-10-CM

## 2022-04-11 ENCOUNTER — Other Ambulatory Visit (HOSPITAL_COMMUNITY): Payer: Self-pay

## 2022-04-11 ENCOUNTER — Other Ambulatory Visit: Payer: Self-pay | Admitting: Oncology

## 2022-04-11 ENCOUNTER — Inpatient Hospital Stay: Payer: Medicaid Other | Attending: Oncology

## 2022-04-11 DIAGNOSIS — C9210 Chronic myeloid leukemia, BCR/ABL-positive, not having achieved remission: Secondary | ICD-10-CM | POA: Insufficient documentation

## 2022-04-11 LAB — COMPREHENSIVE METABOLIC PANEL
ALT: 15 U/L (ref 0–44)
AST: 19 U/L (ref 15–41)
Albumin: 3.7 g/dL (ref 3.5–5.0)
Alkaline Phosphatase: 82 U/L (ref 38–126)
Anion gap: 5 (ref 5–15)
BUN: 11 mg/dL (ref 6–20)
CO2: 28 mmol/L (ref 22–32)
Calcium: 8.8 mg/dL — ABNORMAL LOW (ref 8.9–10.3)
Chloride: 104 mmol/L (ref 98–111)
Creatinine, Ser: 0.64 mg/dL (ref 0.44–1.00)
GFR, Estimated: >60 mL/min (ref >60)
Glucose, Bld: 108 mg/dL — ABNORMAL HIGH (ref 70–99)
Potassium: 3.8 mmol/L (ref 3.5–5.1)
Sodium: 137 mmol/L (ref 135–145)
Total Bilirubin: 0.4 mg/dL (ref 0.3–1.2)
Total Protein: 7.6 g/dL (ref 6.5–8.1)

## 2022-04-11 LAB — CBC WITH DIFFERENTIAL/PLATELET
Abs Immature Granulocytes: 0.02 10*3/uL (ref 0.00–0.07)
Basophils Absolute: 0 10*3/uL (ref 0.0–0.1)
Basophils Relative: 1 %
Eosinophils Absolute: 0.2 10*3/uL (ref 0.0–0.5)
Eosinophils Relative: 3 %
HCT: 37.7 % (ref 36.0–46.0)
Hemoglobin: 11.4 g/dL — ABNORMAL LOW (ref 12.0–15.0)
Immature Granulocytes: 0 %
Lymphocytes Relative: 39 %
Lymphs Abs: 2 10*3/uL (ref 0.7–4.0)
MCH: 27.5 pg (ref 26.0–34.0)
MCHC: 30.2 g/dL (ref 30.0–36.0)
MCV: 90.8 fL (ref 80.0–100.0)
Monocytes Absolute: 0.7 10*3/uL (ref 0.1–1.0)
Monocytes Relative: 14 %
Neutro Abs: 2.2 10*3/uL (ref 1.7–7.7)
Neutrophils Relative %: 43 %
Platelets: 246 10*3/uL (ref 150–400)
RBC: 4.15 MIL/uL (ref 3.87–5.11)
RDW: 16.2 % — ABNORMAL HIGH (ref 11.5–15.5)
WBC: 5.1 10*3/uL (ref 4.0–10.5)
nRBC: 0 % (ref 0.0–0.2)

## 2022-04-11 LAB — TSH: TSH: 3.275 u[IU]/mL (ref 0.350–4.500)

## 2022-04-11 MED ORDER — DASATINIB 100 MG PO TABS
100.0000 mg | ORAL_TABLET | Freq: Every day | ORAL | 1 refills | Status: DC
  Filled 2022-04-11: qty 30, 30d supply, fill #0
  Filled 2022-05-09: qty 30, 30d supply, fill #1

## 2022-04-14 ENCOUNTER — Other Ambulatory Visit (HOSPITAL_COMMUNITY): Payer: Self-pay

## 2022-04-19 ENCOUNTER — Ambulatory Visit
Admission: RE | Admit: 2022-04-19 | Discharge: 2022-04-19 | Disposition: A | Discharge status: Home / Self Care | Payer: Medicaid Other | Source: Ambulatory Visit | Attending: Nurse Practitioner | Admitting: Nurse Practitioner

## 2022-04-19 DIAGNOSIS — Z1231 Encounter for screening mammogram for malignant neoplasm of breast: Secondary | ICD-10-CM | POA: Insufficient documentation

## 2022-04-20 DIAGNOSIS — F330 Major depressive disorder, recurrent, mild: Secondary | ICD-10-CM | POA: Diagnosis not present

## 2022-04-20 DIAGNOSIS — F4312 Post-traumatic stress disorder, chronic: Secondary | ICD-10-CM | POA: Diagnosis not present

## 2022-04-21 DIAGNOSIS — R7989 Other specified abnormal findings of blood chemistry: Secondary | ICD-10-CM | POA: Diagnosis not present

## 2022-04-21 DIAGNOSIS — F411 Generalized anxiety disorder: Secondary | ICD-10-CM | POA: Diagnosis not present

## 2022-04-21 DIAGNOSIS — J4520 Mild intermittent asthma, uncomplicated: Secondary | ICD-10-CM | POA: Diagnosis not present

## 2022-04-21 DIAGNOSIS — I10 Essential (primary) hypertension: Secondary | ICD-10-CM | POA: Diagnosis not present

## 2022-04-21 DIAGNOSIS — E538 Deficiency of other specified B group vitamins: Secondary | ICD-10-CM | POA: Diagnosis not present

## 2022-04-21 DIAGNOSIS — E213 Hyperparathyroidism, unspecified: Secondary | ICD-10-CM | POA: Diagnosis not present

## 2022-05-03 ENCOUNTER — Encounter: Payer: Self-pay | Admitting: Pharmacist

## 2022-05-04 NOTE — Telephone Encounter (Signed)
Called and spoke with patient. She reports having some soreness/pain in the roof of her mouth for the last 2 days. She is not sure if is from something she ate maybe burning her mouth. Dasatinib has a less the 1-10% reported incidence of mouth sores. Offered to send in magic mouthwash for her, but she would like to hold off at this time. She will call if she changes her mind.

## 2022-05-09 ENCOUNTER — Inpatient Hospital Stay: Payer: Medicaid Other | Attending: Oncology

## 2022-05-09 ENCOUNTER — Encounter: Payer: Self-pay | Admitting: Oncology

## 2022-05-09 ENCOUNTER — Telehealth: Payer: Self-pay | Admitting: *Deleted

## 2022-05-09 ENCOUNTER — Other Ambulatory Visit (HOSPITAL_COMMUNITY): Payer: Self-pay

## 2022-05-09 ENCOUNTER — Inpatient Hospital Stay (HOSPITAL_BASED_OUTPATIENT_CLINIC_OR_DEPARTMENT_OTHER): Payer: Medicaid Other | Admitting: Oncology

## 2022-05-09 VITALS — BP 133/87 | HR 77 | Temp 96.8°F | Resp 20 | Wt 321.4 lb

## 2022-05-09 DIAGNOSIS — Z79899 Other long term (current) drug therapy: Secondary | ICD-10-CM | POA: Diagnosis not present

## 2022-05-09 DIAGNOSIS — C9210 Chronic myeloid leukemia, BCR/ABL-positive, not having achieved remission: Secondary | ICD-10-CM | POA: Diagnosis not present

## 2022-05-09 LAB — CBC WITH DIFFERENTIAL/PLATELET
Abs Immature Granulocytes: 0.01 10*3/uL (ref 0.00–0.07)
Basophils Absolute: 0 10*3/uL (ref 0.0–0.1)
Basophils Relative: 1 %
Eosinophils Absolute: 0.2 10*3/uL (ref 0.0–0.5)
Eosinophils Relative: 3 %
HCT: 39.7 % (ref 36.0–46.0)
Hemoglobin: 12.2 g/dL (ref 12.0–15.0)
Immature Granulocytes: 0 %
Lymphocytes Relative: 45 %
Lymphs Abs: 2.9 10*3/uL (ref 0.7–4.0)
MCH: 27.3 pg (ref 26.0–34.0)
MCHC: 30.7 g/dL (ref 30.0–36.0)
MCV: 88.8 fL (ref 80.0–100.0)
Monocytes Absolute: 0.6 10*3/uL (ref 0.1–1.0)
Monocytes Relative: 9 %
Neutro Abs: 2.7 10*3/uL (ref 1.7–7.7)
Neutrophils Relative %: 42 %
Platelets: 252 10*3/uL (ref 150–400)
RBC: 4.47 MIL/uL (ref 3.87–5.11)
RDW: 15.6 % — ABNORMAL HIGH (ref 11.5–15.5)
WBC: 6.4 10*3/uL (ref 4.0–10.5)
nRBC: 0 % (ref 0.0–0.2)

## 2022-05-09 LAB — COMPREHENSIVE METABOLIC PANEL
ALT: 13 U/L (ref 0–44)
AST: 21 U/L (ref 15–41)
Albumin: 3.8 g/dL (ref 3.5–5.0)
Alkaline Phosphatase: 75 U/L (ref 38–126)
Anion gap: 5 (ref 5–15)
BUN: 13 mg/dL (ref 6–20)
CO2: 30 mmol/L (ref 22–32)
Calcium: 8.9 mg/dL (ref 8.9–10.3)
Chloride: 105 mmol/L (ref 98–111)
Creatinine, Ser: 0.76 mg/dL (ref 0.44–1.00)
GFR, Estimated: >60 mL/min (ref >60)
Glucose, Bld: 104 mg/dL — ABNORMAL HIGH (ref 70–99)
Potassium: 3.8 mmol/L (ref 3.5–5.1)
Sodium: 140 mmol/L (ref 135–145)
Total Bilirubin: 0.4 mg/dL (ref 0.3–1.2)
Total Protein: 7.4 g/dL (ref 6.5–8.1)

## 2022-05-09 NOTE — Progress Notes (Signed)
Pt states she has been having hip pain going on two weeks and is radiating from her hip down her rt leg.

## 2022-05-09 NOTE — Telephone Encounter (Signed)
[facility] sent fax about if pt can have a clearance to to deep cleaning and Dr. Janese Banks said yes, the fax was sent  [phone]. Transmission went through.

## 2022-05-09 NOTE — Progress Notes (Signed)
Hematology/Oncology Consult note [facility]  Telephone:([phone] [phone]  Patient Care Team: Berkley Harvey, NP as PCP - General (Nurse Practitioner)   Name of the patient: Carol Ortega  [id number]  [birth year]   Date of visit: 05/09/22  Diagnosis-chronic phase CML  Chief complaint/ Reason for visit-routine follow-up of CML on Dasatinib  Heme/Onc history: patient is a 51 year old African-American femaleWho presents to the ER with symptoms of left-sided flank pain.  CT abdomen and pelvis without contrast showed moderate left hydronephrosis secondary to a 1.1 x 0.9 x 1.3 cm calculus at the left ureteropelvic junction/proximal left ureter.  No other evidence of adenopathy and spleen appeared normal.  Incidentally patient was noted to have a white cell count of 138 which on repeat check was 133.  Platelet counts normal and hemoglobin was 12.1.  Last normal CBC was in April 2021 when white count was 7.2.  Presently differential mainly shows neutrophilia along with lymphocytosis monocytosis basophilia and eosinophilia.  Immature granulocytes were seen.  Pathology smear review shows findings concerning for myeloproliferative neoplasm.  Blasts possibly less than 20% and BCR ABL testing was recommended.Patient noted to have wbc of 18 in may 2022 again with predominant left shift.   BCR ABL FISH testing showed 100% nuclei positive for BCR-ABL gene fusion signals.  BCR ABL PCR testing showed B2 A2 transcript 847.313 and B3 A2 transcript less than 0.0032%.  E1 A2 transcript 0.5645%.  Peripheral blood flow cytometry showedAberrant myeloblast population about 2% of the leukocytes.  Absolute neutrophilia eosinophilia and basophilia.  Patient started Dasatinib in early December 2022. Bone marrow biopsy on 01/25/2021 showed hypercellular bone marrow with pan myeloid proliferation.  No increased number of blasts seen.  Interval history-tolerating Dasatinib well.  Has  occasional nausea but denies other side effects.  ECOG PS- 1 Pain scale- 0   Review of systems- Review of Systems  Constitutional:  Negative for chills, fever, malaise/fatigue and weight loss.  HENT:  Negative for congestion, ear discharge and nosebleeds.   Eyes:  Negative for blurred vision.  Respiratory:  Negative for cough, hemoptysis, sputum production, shortness of breath and wheezing.   Cardiovascular:  Negative for chest pain, palpitations, orthopnea and claudication.  Gastrointestinal:  Negative for abdominal pain, blood in stool, constipation, diarrhea, heartburn, melena, nausea and vomiting.  Genitourinary:  Negative for dysuria, flank pain, frequency, hematuria and urgency.  Musculoskeletal:  Negative for back pain, joint pain and myalgias.  Skin:  Negative for rash.  Neurological:  Negative for dizziness, tingling, focal weakness, seizures, weakness and headaches.  Endo/Heme/Allergies:  Does not bruise/bleed easily.  Psychiatric/Behavioral:  Negative for depression and suicidal ideas. The patient does not have insomnia.      Allergies  Allergen Reactions   Bee Venom Anaphylaxis   Contrast Media [Iodinated Contrast Media] Anaphylaxis   Eggs Or Egg-Derived Products Anaphylaxis   Other Anaphylaxis    Surgical dye.. Pt states ok to take if previously taken benadryl or prednisone   Penicillins Hives and Other (See Comments)    Has patient had a PCN reaction causing immediate rash, facial/tongue/throat swelling, SOB or lightheadedness with hypotension: No Has patient had a PCN reaction causing severe rash involving mucus membranes or skin necrosis: No Has patient had a PCN reaction that required hospitalization No Has patient had a PCN reaction occurring within the last 10 years: No If all of the above answers are "NO", then may proceed with Cephalosporin use.   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[facility])    Pulmonary emboli ([place]) 2011   Pulmonary embolism (H[facility])    Radiation    Brain tumor   UTI (urinary tract infection)    Vitamin D deficiency      Past Surgical History:  Procedure Laterality Date   CHOLECYSTECTOMY  1999   CYSTOSCOPY W/ RETROGRADES Left 03/01/2018   Procedure: CYSTOSCOPY WITH RETROGRADE PYELOGRAM;  Surgeon: Abbie Sons, MD;  Location: [facility];  Service: Urology;  Laterality: Left;   CYSTOSCOPY W/ URETERAL STENT PLACEMENT Right 04/08/2017   Procedure: CYSTOSCOPY WITH RETROGRADE PYELOGRAM/URETERAL STENT PLACEMENT;  Surgeon: Rana Snare, MD;  Location: [facility];  Service: Urology;  Laterality: Right;   CYSTOSCOPY W/ URETERAL STENT PLACEMENT Left 03/12/2018   Procedure: CYSTOSCOPY WITH STENT REPLACEMENT;  Surgeon: Abbie Sons, MD;  Location: [facility];  Service: Urology;  Laterality: Left;   CYSTOSCOPY W/ URETEROSCOPY     CYSTOSCOPY WITH RETROGRADE PYELOGRAM, URETEROSCOPY AND STENT PLACEMENT Left 01/20/2014   Procedure: LEFT URETEROSCOPY WITH STENT PLACEMENT;  Surgeon: Irine Seal, MD;  Location[facility];  Service: Urology;  Laterality: Left;   CYSTOSCOPY WITH RETROGRADE PYELOGRAM, URETEROSCOPY AND STENT PLACEMENT Bilateral 04/23/2015   Procedure: CYSTOSCOPY WITH BILATERAL RETROGRADE PYELOGRAM, URETEROSCOPY AND STENT PLACEMENT;  Surgeon: Alexis Frock, MD;  Location[facility];  Service: Urology;  Laterality: Bilateral;   CYSTOSCOPY WITH RETROGRADE PYELOGRAM, URETEROSCOPY AND STENT PLACEMENT Right 04/13/2017   Procedure:  CYSTOSCOPY WITH RETROGRADE PYELOGRAM, URETEROSCOPY AND STENT EXCHANGE;  Surgeon: Alexis Frock, MD;  [facility];  Service: Urology;  Laterality: Right;   CYSTOSCOPY WITH STENT PLACEMENT Left 01/12/2014   Procedure: CYSTOSCOPY WITH STENT PLACEMENT left retrograde;  Surgeon: Irine Seal, MD;  [facility];  Service: Urology;  Laterality: Left;   CYSTOSCOPY WITH STENT PLACEMENT Left 03/01/2018   Procedure: CYSTOSCOPY WITH STENT PLACEMENT;  Surgeon: Abbie Sons, MD;  Location: [facility];  Service: Urology;  Laterality: Left;   CYSTOSCOPY/URETEROSCOPY/HOLMIUM LASER/STENT PLACEMENT Left 03/12/2018   Procedure: CYSTOSCOPY/URETEROSCOPY/HOLMIUM LASER;  Surgeon: Abbie Sons, MD;  Location: [facility];  Service: Urology;  Laterality: Left;   CYSTOSCOPY/URETEROSCOPY/HOLMIUM LASER/STENT PLACEMENT Left 11/15/2021   Procedure: CYSTOSCOPY/URETEROSCOPY/HOLMIUM LASER/STENT PLACEMENT;  Surgeon: Abbie Sons, MD;  Location: [facility];  Service: Urology;  Laterality: Left;   HOLMIUM LASER APPLICATION Left 8/0/0349   Procedure: HOLMIUM LASER APPLICATION;  Surgeon: Irine Seal, MD;  [facility];  Service: Urology;  Laterality: Left;   HOLMIUM LASER APPLICATION Bilateral 12/24/9148   Procedure: HOLMIUM LASER APPLICATION;  Surgeon: Alexis Frock, MD;  [facility];  Service: Urology;  Laterality: Bilateral;   HOLMIUM LASER APPLICATION Right 5/69/7948   Procedure: HOLMIUM LASER APPLICATION;  Surgeon: Alexis Frock, MD;  [facility];  Service: Urology;  Laterality: Right;   kidney stone removal     LITHOTRIPSY     PARATHYROIDECTOMY Right  11/13/2016   Procedure: RIGHT SUPERIOR PARATHYROIDECTOMY;  Surgeon: Armandina Gemma, MD;  Location: [place];  Service: General;  Laterality: Right;   surgery for,ectopic pregnancy  2011    1 fallopian tube rupture   TUBAL LIGATION  2011    Social History   Socioeconomic History   Marital status: Divorced    Spouse name: Not on file   Number of children: 4    Years of education: Not on file   Highest education level: Some college, no degree  Occupational History   Not on file  Tobacco Use   Smoking status: Never   Smokeless tobacco: Never  Vaping Use   Vaping Use: Never used  Substance and Sexual Activity   Alcohol use: Not Currently   Drug use: Not Currently    Types: Marijuana   Sexual activity: Yes  Other Topics Concern   Not on file  Social History Narrative   Lives at home with her son, independent at baselinepend   Social Determinants of Health   Financial Resource Strain: Not on file  Food Insecurity: Not on file  Transportation Needs: Not on file  Physical Activity: Not on file  Stress: Not on file  Social Connections: Not on file  Intimate Partner Violence: Not on file    Family History  Problem Relation Age of Onset   Bell's palsy Mother    Heart failure Mother    Hypertension Mother    Lupus Mother    Anxiety disorder Mother    Depression Mother    Stroke Mother    Asthma Father    Hypertension Father    Hyperlipidemia Father    Anxiety disorder Father    Depression Father    Hypertension Maternal Grandmother    Diabetes Maternal Grandmother    Sarcoidosis Maternal Grandmother    Anxiety disorder Sister    Depression Sister    Bipolar disorder Sister    Depression Son    Bipolar disorder Son      Current Outpatient Medications:    cetirizine (ZYRTEC) 10 MG tablet, Take 1 tablet by mouth daily., Disp: , Rfl:    dasatinib (SPRYCEL) 100 MG tablet, Take 1 tablet (100 mg total) by mouth daily., Disp: 30 tablet, Rfl: 1   DULoxetine (CYMBALTA) 60 MG capsule, Take 1 capsule by mouth daily., Disp: , Rfl:    hydrOXYzine (ATARAX) 10 MG tablet, Take 10 mg by mouth 3 (three) times daily as needed for anxiety., Disp: , Rfl:    Vitamin D, Ergocalciferol, (DRISDOL) 1.25 MG (50000 UNIT) CAPS capsule, Take 50,000 Units by mouth once a week., Disp: , Rfl:    albuterol (PROVENTIL HFA;VENTOLIN HFA) 108 (90 Base)  MCG/ACT inhaler, Inhale 2 puffs into the lungs every 6 (six) hours as needed for wheezing or shortness of breath. (Patient not taking: Reported on 05/09/2022), Disp: 1 Inhaler, Rfl: 2   albuterol (PROVENTIL) (2.5 MG/3ML) 0.083% nebulizer solution, Take 3 mLs (2.5 mg total) by nebulization every 6 (six) hours as needed for wheezing or shortness of breath. (Patient not taking: Reported on 05/09/2022), Disp: 75 mL, Rfl: 12   EPINEPHrine 0.3 mg/0.3 mL IJ SOAJ injection, Inject into the muscle. (Patient not taking: Reported on 05/09/2022), Disp: , Rfl:   Physical exam:  Vitals:   05/09/22 1012  BP: 133/87  Pulse: 77  Resp: 20  Temp: (!) 96.8 F (36 C)  SpO2: 99%  Weight: (!) 321 lb 6.4 oz (145.8 kg)   Physical Exam Constitutional:  General: She is not in acute distress. Cardiovascular:     Rate and Rhythm: Normal rate and regular rhythm.     Heart sounds: Normal heart sounds.  Pulmonary:     Effort: Pulmonary effort is normal.     Breath sounds: Normal breath sounds.  Skin:    General: Skin is warm and dry.  Neurological:     Mental Status: She is alert and oriented to person, place, and time.        Latest Ref Rng & Units 05/09/2022    9:52 AM  CMP  Glucose 70 - 99 mg/dL 104    BUN 6 - 20 mg/dL 13    Creatinine 0.44 - 1.00 mg/dL 0.76    Sodium 135 - 145 mmol/L 140    Potassium 3.5 - 5.1 mmol/L 3.8    Chloride 98 - 111 mmol/L 105    CO2 22 - 32 mmol/L 30    Calcium 8.9 - 10.3 mg/dL 8.9    Total Protein 6.5 - 8.1 g/dL 7.4    Total Bilirubin 0.3 - 1.2 mg/dL 0.4    Alkaline Phos 38 - 126 U/L 75    AST 15 - 41 U/L 21    ALT 0 - 44 U/L 13        Latest Ref Rng & Units 05/09/2022    9:52 AM  CBC  WBC 4.0 - 10.5 K/uL 6.4    Hemoglobin 12.0 - 15.0 g/dL 12.2    Hematocrit 36.0 - 46.0 % 39.7    Platelets 150 - 400 K/uL 252      No images are attached to the encounter.  MM 3D SCREEN BREAST BILATERAL  Result Date: 04/20/2022 CLINICAL DATA:  Screening. EXAM: DIGITAL  SCREENING BILATERAL MAMMOGRAM WITH TOMOSYNTHESIS AND CAD TECHNIQUE: Bilateral screening digital craniocaudal and mediolateral oblique mammograms were obtained. Bilateral screening digital breast tomosynthesis was performed. The images were evaluated with computer-aided detection. COMPARISON:  Previous exam(s). ACR Breast Density Category b: There are scattered areas of fibroglandular density. FINDINGS: There are no findings suspicious for malignancy. IMPRESSION: No mammographic evidence of malignancy. A result letter of this screening mammogram will be mailed directly to the patient. RECOMMENDATION: Screening mammogram in one year. (Code:SM-B-01Y) BI-RADS CATEGORY  1: Negative. Electronically Signed   By: Valentino Saxon M.D.   On: 04/20/2022 11:44     Assessment and plan- Patient is a 51 y.o. female with chronic phase CML on Dasatinib here for routine follow-up  BCR ABL FISH and PCR testing from today are pending.  CBC is presently normal and CMP is normal as well.Her prior numbers from March 2023 showed continued improvement in BCR ABL trends transcripts.  Plan is to continue Dasatinib until progression or toxicity.  I will see her back in 3 months with CBC with differential CMP BCR ABL FISH and BCR ABL PCR testing   Visit Diagnosis 1. CML (chronic myelocytic leukemia) ([place])   2. High risk medication use      Dr. Randa Evens, MD, MPH [facility] at [facility] [phone] 05/09/2022 4:31 PM

## 2022-05-10 ENCOUNTER — Other Ambulatory Visit (HOSPITAL_COMMUNITY): Payer: Self-pay

## 2022-05-12 LAB — BCR-ABL1 FISH
Cells Analyzed: 200
Cells Counted: 200

## 2022-05-16 DIAGNOSIS — F330 Major depressive disorder, recurrent, mild: Secondary | ICD-10-CM | POA: Diagnosis not present

## 2022-05-16 DIAGNOSIS — F4312 Post-traumatic stress disorder, chronic: Secondary | ICD-10-CM | POA: Diagnosis not present

## 2022-05-23 LAB — BCR-ABL1, CML/ALL, PCR, QUANT: b2a2 transcript: 0.1285 %

## 2022-06-02 ENCOUNTER — Other Ambulatory Visit (HOSPITAL_COMMUNITY): Payer: Self-pay

## 2022-06-05 ENCOUNTER — Other Ambulatory Visit: Payer: Self-pay | Admitting: Oncology

## 2022-06-05 ENCOUNTER — Other Ambulatory Visit (HOSPITAL_COMMUNITY): Payer: Self-pay

## 2022-06-05 DIAGNOSIS — Z1331 Encounter for screening for depression: Secondary | ICD-10-CM | POA: Diagnosis not present

## 2022-06-05 DIAGNOSIS — C9210 Chronic myeloid leukemia, BCR/ABL-positive, not having achieved remission: Secondary | ICD-10-CM

## 2022-06-05 DIAGNOSIS — Z01411 Encounter for gynecological examination (general) (routine) with abnormal findings: Secondary | ICD-10-CM | POA: Diagnosis not present

## 2022-06-05 MED ORDER — DASATINIB 100 MG PO TABS
100.0000 mg | ORAL_TABLET | Freq: Every day | ORAL | 1 refills | Status: DC
  Filled 2022-06-05: qty 30, 30d supply, fill #0
  Filled 2022-06-28: qty 30, 30d supply, fill #1

## 2022-06-05 NOTE — Telephone Encounter (Signed)
CBC with Differential/Platelet Order: [id number] Status: Final result    Visible to patient: Yes (seen)    Next appt: 08/11/2022 at 10:00 AM in Oncology ([facility])    Dx: CML (chronic myelocytic leukemia) ([place])    0 Result Notes           Component Ref Range & Units 3 wk ago (05/09/22) 1 mo ago (04/11/22) 2 mo ago (03/18/22) 2 mo ago (03/14/22) 4 mo ago (01/25/22) 4 mo ago (01/20/22) 5 mo ago (12/23/21)  WBC 4.0 - 10.5 K/uL 6.4  5.1  8.1  6.2  4.0  4.1  5.5   RBC 3.87 - 5.11 MIL/uL 4.47  4.15  4.31  3.95  3.85 Low   3.83 Low   3.98   Hemoglobin 12.0 - 15.0 g/dL 12.2  11.4 Low   11.6 Low   11.0 Low   10.4 Low   10.5 Low   10.7 Low    HCT 36.0 - 46.0 % 39.7  37.7  38.7  36.2  34.3 Low   34.1 Low   35.2 Low    MCV 80.0 - 100.0 fL 88.8  90.8  89.8  91.6  89.1  89.0  88.4   MCH 26.0 - 34.0 pg 27.3  27.5  26.9  27.8  27.0  27.4  26.9   MCHC 30.0 - 36.0 g/dL 30.7  30.2  30.0  30.4  30.3  30.8  30.4   RDW 11.5 - 15.5 % 15.6 High   16.2 High   16.3 High   16.5 High   19.0 High   18.6 High   19.1 High    Platelets 150 - 400 K/uL 252  246  266  282  268  222  311   nRBC 0.0 - 0.2 % 0.0  0.0  0.0 CM  0.0  0.0  0.0  0.0   Neutrophils Relative % % 42  43   45  40  46  47   Neutro Abs 1.7 - 7.7 K/uL 2.7  2.2   2.8  1.6 Low   1.9  2.6   Lymphocytes Relative % 45  39   40  43  39  31   Lymphs Abs 0.7 - 4.0 K/uL 2.9  2.0   2.5  1.7  1.6  1.7   Monocytes Relative % '9  14   11  12  12  16   '$ Monocytes Absolute 0.1 - 1.0 K/uL 0.6  0.7   0.7  0.5  0.5  0.9   Eosinophils Relative % '3  3   3  3  2  4   '$ Eosinophils Absolute 0.0 - 0.5 K/uL 0.2  0.2   0.2  0.1  0.1  0.2   Basophils Relative % '1  1   1  1  1  1   '$ Basophils Absolute 0.0 - 0.1 K/uL 0.0  0.0   0.0  0.0  0.0  0.1   Immature Granulocytes % 0  0   0  1  0  1   Abs Immature Granulocytes 0.00 - 0.07 K/uL 0.01  0.02 CM   0.01 CM  0.02 CM  0.00 CM  0.04 CM   Comment: Performed at [facility], [address] [place], [place] [address]  Resulting  Agency  [place] [facility] [place] [facility] [place] [facility] [place] [facility] [place] [facility] [place] [facility] [place] [facility]  Specimen Collected: 05/09/22 09:52 Last Resulted: 05/09/22 10:06      Lab Flowsheet    Order Details    View Encounter    Lab and Collection Details    Routing    Result History    View All Conversations on this Encounter      CM=Additional comments      Result Care Coordination   Patient Communication   Add Comments   Seen Back to Top       Other Results from 05/09/2022   Contains abnormal data Comprehensive metabolic panel Order: [id number] Status: Final result    Visible to patient: Yes (seen)    Next appt: 08/11/2022 at 10:00 AM in Oncology ([facility])    Dx: CML (chronic myelocytic leukemia) ([place])    0 Result Notes           Component Ref Range & Units 3 wk ago (05/09/22) 1 mo ago (04/11/22) 2 mo ago (03/18/22) 2 mo ago (03/14/22) 4 mo ago (01/20/22) 6 mo ago (12/06/21) 6 mo ago (11/28/21)  Sodium 135 - 145 mmol/L 140  137  140  138  136  137  137   Potassium 3.5 - 5.1 mmol/L 3.8  3.8  3.7  3.7  3.7  3.4 Low   3.9   Chloride 98 - 111 mmol/L 105  104  103  104  105  102  101   CO2 22 - 32 mmol/L '30  28  30  29  27  29  28   '$ Glucose, Bld 70 - 99 mg/dL 104 High   108 High  CM  114 High  CM  104 High  CM  97 CM  111 High  CM  111 High  CM   Comment: Glucose reference range applies only to samples taken after fasting for at least 8 hours.  BUN 6 - 20 mg/dL '13  11  10  12  10  9  10   '$ Creatinine, Ser 0.44 - 1.00 mg/dL 0.76  0.64  0.66  0.80  0.69  0.99  0.81   Calcium 8.9 - 10.3 mg/dL 8.9  8.8 Low   9.2  8.6 Low   8.6 Low   8.7 Low   8.9   Total Protein 6.5 - 8.1 g/dL 7.4  7.6   7.2  7.6  7.6  7.7   Albumin 3.5 - 5.0 g/dL 3.8  3.7   3.5  3.6  3.7  3.7   AST 15 - 41 U/L '21  19   15  17  23  26   '$ ALT 0 - 44 U/L '13  15   11  15  24  30   '$ Alkaline Phosphatase 38 - 126 U/L 75  82   80  80  77  80   Total Bilirubin 0.3 - 1.2 mg/dL 0.4  0.4   0.3  0.5  0.5  0.7   GFR,  Estimated >60 mL/min >60  >60 CM  >60 CM  >60 CM  >60 CM  >60 CM  >60 CM   Comment: (NOTE)  Calculated using the CKD-EPI Creatinine Equation (2021)   Anion gap 5 - '15 5  5 '$ CM  7 CM  5 CM  4 Low  CM  6 CM  8 CM   Comment: Performed at [facility], [address] [place], [place] [address]  Resulting Agency  [facility] [place] [facility] [place] [facility] [place]  [facility] [place] [facility] [place] [facility] [place] [facility]         Specimen Collected: 05/09/22 09:52 Last Resulted: 05/09/22 10:24

## 2022-06-08 ENCOUNTER — Other Ambulatory Visit (HOSPITAL_COMMUNITY): Payer: Self-pay

## 2022-06-27 NOTE — Telephone Encounter (Signed)
Have her come for cbc with diff and cmp this week

## 2022-06-27 NOTE — Telephone Encounter (Signed)
Orders are in

## 2022-06-28 ENCOUNTER — Other Ambulatory Visit (HOSPITAL_COMMUNITY): Payer: Self-pay

## 2022-06-28 ENCOUNTER — Encounter: Payer: Self-pay | Admitting: *Deleted

## 2022-06-28 ENCOUNTER — Inpatient Hospital Stay: Payer: Medicaid Other | Attending: Oncology

## 2022-06-28 DIAGNOSIS — C9210 Chronic myeloid leukemia, BCR/ABL-positive, not having achieved remission: Secondary | ICD-10-CM | POA: Insufficient documentation

## 2022-06-28 DIAGNOSIS — Z79899 Other long term (current) drug therapy: Secondary | ICD-10-CM | POA: Insufficient documentation

## 2022-06-28 LAB — COMPREHENSIVE METABOLIC PANEL
ALT: 15 U/L (ref 0–44)
AST: 18 U/L (ref 15–41)
Albumin: 3.4 g/dL — ABNORMAL LOW (ref 3.5–5.0)
Alkaline Phosphatase: 70 U/L (ref 38–126)
Anion gap: 6 (ref 5–15)
BUN: 13 mg/dL (ref 6–20)
CO2: 28 mmol/L (ref 22–32)
Calcium: 8.6 mg/dL — ABNORMAL LOW (ref 8.9–10.3)
Chloride: 104 mmol/L (ref 98–111)
Creatinine, Ser: 0.82 mg/dL (ref 0.44–1.00)
GFR, Estimated: >60 mL/min (ref >60)
Glucose, Bld: 103 mg/dL — ABNORMAL HIGH (ref 70–99)
Potassium: 4 mmol/L (ref 3.5–5.1)
Sodium: 138 mmol/L (ref 135–145)
Total Bilirubin: 0.3 mg/dL (ref 0.3–1.2)
Total Protein: 7 g/dL (ref 6.5–8.1)

## 2022-06-28 LAB — CBC WITH DIFFERENTIAL/PLATELET
Abs Immature Granulocytes: 0.01 10*3/uL (ref 0.00–0.07)
Basophils Absolute: 0 10*3/uL (ref 0.0–0.1)
Basophils Relative: 0 %
Eosinophils Absolute: 0.2 10*3/uL (ref 0.0–0.5)
Eosinophils Relative: 3 %
HCT: 39.4 % (ref 36.0–46.0)
Hemoglobin: 12 g/dL (ref 12.0–15.0)
Immature Granulocytes: 0 %
Lymphocytes Relative: 40 %
Lymphs Abs: 2.7 10*3/uL (ref 0.7–4.0)
MCH: 27.6 pg (ref 26.0–34.0)
MCHC: 30.5 g/dL (ref 30.0–36.0)
MCV: 90.8 fL (ref 80.0–100.0)
Monocytes Absolute: 0.7 10*3/uL (ref 0.1–1.0)
Monocytes Relative: 10 %
Neutro Abs: 3.1 10*3/uL (ref 1.7–7.7)
Neutrophils Relative %: 47 %
Platelets: 253 10*3/uL (ref 150–400)
RBC: 4.34 MIL/uL (ref 3.87–5.11)
RDW: 16.1 % — ABNORMAL HIGH (ref 11.5–15.5)
WBC: 6.7 10*3/uL (ref 4.0–10.5)
nRBC: 0 % (ref 0.0–0.2)

## 2022-06-29 DIAGNOSIS — H5203 Hypermetropia, bilateral: Secondary | ICD-10-CM | POA: Diagnosis not present

## 2022-06-29 DIAGNOSIS — H5213 Myopia, bilateral: Secondary | ICD-10-CM | POA: Diagnosis not present

## 2022-06-30 ENCOUNTER — Other Ambulatory Visit (HOSPITAL_COMMUNITY): Payer: Self-pay

## 2022-07-25 ENCOUNTER — Other Ambulatory Visit: Payer: Self-pay | Admitting: Oncology

## 2022-07-25 ENCOUNTER — Other Ambulatory Visit (HOSPITAL_COMMUNITY): Payer: Self-pay

## 2022-07-25 DIAGNOSIS — C9210 Chronic myeloid leukemia, BCR/ABL-positive, not having achieved remission: Secondary | ICD-10-CM

## 2022-07-25 NOTE — Telephone Encounter (Signed)
Next appointment 08/11/22 CBC with Differential/Platelet Order: [id number] Status: Final result    Visible to patient: Yes (seen)    Next appt: 08/11/2022 at 10:00 AM in Oncology ([facility])    Dx: CML (chronic myelocytic leukemia) ([place])    1 Result Note           Component Ref Range & Units 3 wk ago (06/28/22) 2 mo ago (05/09/22) 3 mo ago (04/11/22) 4 mo ago (03/18/22) 4 mo ago (03/14/22) 6 mo ago (01/25/22) 6 mo ago (01/20/22)  WBC 4.0 - 10.5 K/uL 6.7  6.4  5.1  8.1  6.2  4.0  4.1   RBC 3.87 - 5.11 MIL/uL 4.34  4.47  4.15  4.31  3.95  3.85 Low   3.83 Low    Hemoglobin 12.0 - 15.0 g/dL 12.0  12.2  11.4 Low   11.6 Low   11.0 Low   10.4 Low   10.5 Low    HCT 36.0 - 46.0 % 39.4  39.7  37.7  38.7  36.2  34.3 Low   34.1 Low    MCV 80.0 - 100.0 fL 90.8  88.8  90.8  89.8  91.6  89.1  89.0   MCH 26.0 - 34.0 pg 27.6  27.3  27.5  26.9  27.8  27.0  27.4   MCHC 30.0 - 36.0 g/dL 30.5  30.7  30.2  30.0  30.4  30.3  30.8   RDW 11.5 - 15.5 % 16.1 High   15.6 High   16.2 High   16.3 High   16.5 High   19.0 High   18.6 High    Platelets 150 - 400 K/uL 253  252  246  266  282  268  222   nRBC 0.0 - 0.2 % 0.0  0.0  0.0  0.0 CM  0.0  0.0  0.0   Neutrophils Relative % % 47  42  43   45  40  46   Neutro Abs 1.7 - 7.7 K/uL 3.1  2.7  2.2   2.8  1.6 Low   1.9   Lymphocytes Relative % 40  45  39   40  43  39   Lymphs Abs 0.7 - 4.0 K/uL 2.7  2.9  2.0   2.5  1.7  1.6   Monocytes Relative % '10  9  14   11  12  12   '$ Monocytes Absolute 0.1 - 1.0 K/uL 0.7  0.6  0.7   0.7  0.5  0.5   Eosinophils Relative % '3  3  3   3  3  2   '$ Eosinophils Absolute 0.0 - 0.5 K/uL 0.2  0.2  0.2   0.2  0.1  0.1   Basophils Relative % 0  '1  1   1  1  1   '$ Basophils Absolute 0.0 - 0.1 K/uL 0.0  0.0  0.0   0.0  0.0  0.0   Immature Granulocytes % 0  0  0   0  1  0   Abs Immature Granulocytes 0.00 - 0.07 K/uL 0.01  0.01 CM  0.02 CM   0.01 CM  0.02 CM  0.00 CM   Comment: Performed at [facility], [address] [place],  [address]   Resulting Agency  [place] [facility] [place] [facility] [place] [facility] [place] [facility] [place] [facility] [place] [facility] [place] [facility]  Specimen Collected: 06/28/22 10:38 Last Resulted: 06/28/22 10:54      Lab Flowsheet    Order Details    View Encounter    Lab and Collection Details    Routing    Result History    View All Conversations on this Encounter      CM=Additional comments      Result Care Coordination   Result Notes   Sindy Guadeloupe, MD  06/28/2022 11:06 AM EDT Back to Top    Her labs do not look concerning    Patient Communication   Add Comments   Seen Back to Top       Other Results from 06/28/2022   Contains abnormal data Comprehensive metabolic panel Order: [id number] Status: Final result    Visible to patient: Yes (seen)    Next appt: 08/11/2022 at 10:00 AM in Oncology ([facility])    Dx: CML (chronic myelocytic leukemia) ([place])    1 Result Note           Component Ref Range & Units 3 wk ago (06/28/22) 2 mo ago (05/09/22) 3 mo ago (04/11/22) 4 mo ago (03/18/22) 4 mo ago (03/14/22) 6 mo ago (01/20/22) 7 mo ago (12/06/21)  Sodium 135 - 145 mmol/L 138  140  137  140  138  136  137   Potassium 3.5 - 5.1 mmol/L 4.0  3.8  3.8  3.7  3.7  3.7  3.4 Low    Chloride 98 - 111 mmol/L 104  105  104  103  104  105  102   CO2 22 - 32 mmol/L '28  30  28  30  29  27  29   '$ Glucose, Bld 70 - 99 mg/dL 103 High   104 High  CM  108 High  CM  114 High  CM  104 High  CM  97 CM  111 High  CM   Comment: Glucose reference range applies only to samples taken after fasting for at least 8 hours.  BUN 6 - 20 mg/dL '13  13  11  10  12  10  9   '$ Creatinine, Ser 0.44 - 1.00 mg/dL 0.82  0.76  0.64  0.66  0.80  0.69  0.99   Calcium 8.9 - 10.3 mg/dL 8.6 Low   8.9  8.8 Low   9.2  8.6 Low   8.6 Low   8.7 Low    Total Protein 6.5 - 8.1 g/dL 7.0  7.4  7.6   7.2  7.6  7.6   Albumin 3.5 - 5.0 g/dL 3.4 Low   3.8  3.7   3.5  3.6  3.7   AST 15 - 41 U/L '18  21  19   15  17  23   '$ ALT 0 - 44 U/L '15  13  15   11  15  24    '$ Alkaline Phosphatase 38 - 126 U/L 70  75  82   80  80  77   Total Bilirubin 0.3 - 1.2 mg/dL 0.3  0.4  0.4   0.3  0.5  0.5   GFR, Estimated >60 mL/min >60  >60 CM  >60 CM  >60 CM  >60 CM  >60 CM  >60 CM   Comment: (NOTE)  Calculated using the CKD-EPI Creatinine Equation (2021)   Anion gap 5 - '15 6  5 '$ CM  5 CM  7 CM  5 CM  4 Low  CM  6 CM   Comment: Performed at [facility], [address] [place], [place] [address]  [place]  [facility] CL[facility] [place] [facility] [place] [facility] [place] [facility] [place] [facility] [place] [facility]         Specimen Collected: 06/28/22 10:38 Last Resulted: 06/28/22 11:00

## 2022-07-27 ENCOUNTER — Other Ambulatory Visit (HOSPITAL_COMMUNITY): Payer: Self-pay

## 2022-07-27 MED ORDER — DASATINIB 100 MG PO TABS
100.0000 mg | ORAL_TABLET | Freq: Every day | ORAL | 1 refills | Status: DC
  Filled 2022-07-27: qty 30, 30d supply, fill #0
  Filled 2022-08-16: qty 30, 30d supply, fill #1

## 2022-07-31 ENCOUNTER — Other Ambulatory Visit (HOSPITAL_COMMUNITY): Payer: Self-pay

## 2022-08-07 DIAGNOSIS — F330 Major depressive disorder, recurrent, mild: Secondary | ICD-10-CM | POA: Diagnosis not present

## 2022-08-07 DIAGNOSIS — F4312 Post-traumatic stress disorder, chronic: Secondary | ICD-10-CM | POA: Diagnosis not present

## 2022-08-10 ENCOUNTER — Other Ambulatory Visit: Payer: Self-pay | Admitting: *Deleted

## 2022-08-10 DIAGNOSIS — C9210 Chronic myeloid leukemia, BCR/ABL-positive, not having achieved remission: Secondary | ICD-10-CM

## 2022-08-11 ENCOUNTER — Other Ambulatory Visit: Payer: Self-pay

## 2022-08-11 ENCOUNTER — Encounter: Payer: Self-pay | Admitting: Oncology

## 2022-08-11 ENCOUNTER — Inpatient Hospital Stay: Payer: Medicaid Other | Attending: Oncology

## 2022-08-11 ENCOUNTER — Inpatient Hospital Stay (HOSPITAL_BASED_OUTPATIENT_CLINIC_OR_DEPARTMENT_OTHER): Payer: Medicaid Other | Admitting: Oncology

## 2022-08-11 VITALS — BP 128/92 | HR 80 | Resp 18 | Ht 66.0 in | Wt 324.0 lb

## 2022-08-11 DIAGNOSIS — M5430 Sciatica, unspecified side: Secondary | ICD-10-CM

## 2022-08-11 DIAGNOSIS — Z79899 Other long term (current) drug therapy: Secondary | ICD-10-CM

## 2022-08-11 DIAGNOSIS — C9210 Chronic myeloid leukemia, BCR/ABL-positive, not having achieved remission: Secondary | ICD-10-CM

## 2022-08-11 LAB — COMPREHENSIVE METABOLIC PANEL
ALT: 15 U/L (ref 0–44)
AST: 21 U/L (ref 15–41)
Albumin: 3.8 g/dL (ref 3.5–5.0)
Alkaline Phosphatase: 66 U/L (ref 38–126)
Anion gap: 6 (ref 5–15)
BUN: 14 mg/dL (ref 6–20)
CO2: 28 mmol/L (ref 22–32)
Calcium: 8.9 mg/dL (ref 8.9–10.3)
Chloride: 105 mmol/L (ref 98–111)
Creatinine, Ser: 0.77 mg/dL (ref 0.44–1.00)
GFR, Estimated: >60 mL/min (ref >60)
Glucose, Bld: 116 mg/dL — ABNORMAL HIGH (ref 70–99)
Potassium: 3.6 mmol/L (ref 3.5–5.1)
Sodium: 139 mmol/L (ref 135–145)
Total Bilirubin: 0.5 mg/dL (ref 0.3–1.2)
Total Protein: 7.5 g/dL (ref 6.5–8.1)

## 2022-08-11 LAB — CBC WITH DIFFERENTIAL/PLATELET
Abs Immature Granulocytes: 0.02 10*3/uL (ref 0.00–0.07)
Basophils Absolute: 0.1 10*3/uL (ref 0.0–0.1)
Basophils Relative: 1 %
Eosinophils Absolute: 0.2 10*3/uL (ref 0.0–0.5)
Eosinophils Relative: 3 %
HCT: 37.9 % (ref 36.0–46.0)
Hemoglobin: 12 g/dL (ref 12.0–15.0)
Immature Granulocytes: 0 %
Lymphocytes Relative: 37 %
Lymphs Abs: 2.3 10*3/uL (ref 0.7–4.0)
MCH: 28.8 pg (ref 26.0–34.0)
MCHC: 31.7 g/dL (ref 30.0–36.0)
MCV: 90.9 fL (ref 80.0–100.0)
Monocytes Absolute: 0.8 10*3/uL (ref 0.1–1.0)
Monocytes Relative: 12 %
Neutro Abs: 3 10*3/uL (ref 1.7–7.7)
Neutrophils Relative %: 47 %
Platelets: 258 10*3/uL (ref 150–400)
RBC: 4.17 MIL/uL (ref 3.87–5.11)
RDW: 15.5 % (ref 11.5–15.5)
WBC: 6.3 10*3/uL (ref 4.0–10.5)
nRBC: 0 % (ref 0.0–0.2)

## 2022-08-11 NOTE — Progress Notes (Signed)
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  CT abdomen and pelvis without contrast showed moderate left hydronephrosis secondary to a 1.1 x 0.9 x 1.3 cm calculus at the left ureteropelvic junction/proximal left ureter.  No other evidence of adenopathy and spleen appeared normal.  Incidentally patient was noted to have a white cell count of 138 which on repeat check was 133.  Platelet counts normal and hemoglobin was 12.1.  Last normal CBC was in April 2021 when white count was 7.2.  Presently differential mainly shows neutrophilia along with lymphocytosis monocytosis basophilia and eosinophilia.  Immature granulocytes were seen.  Pathology smear review shows findings concerning for myeloproliferative neoplasm.  Blasts possibly less than 20% and BCR ABL testing was recommended.Patient noted to have wbc of 18 in may 2022 again with predominant left shift.   BCR ABL FISH testing showed 100% nuclei positive for BCR-ABL gene fusion signals.  BCR ABL PCR testing showed B2 A2 transcript 847.313 and B3 A2 transcript less than 0.0032%.  E1 A2 transcript 0.5645%.  Peripheral blood flow cytometry showedAberrant myeloblast population about 2% of the leukocytes.  Absolute neutrophilia eosinophilia and basophilia.  Patient started Dasatinib in early December 2022. Bone marrow biopsy on 01/25/2021 showed hypercellular bone marrow with pan myeloid proliferation.  No increased number of blasts seen.    Interval history-tolerating  Dasatinib well overall.  Has occasional nausea which is currently well controlled.  Has a few bruises over her right leg which come and go.  Denies any shortness of breath.  ECOG PS- 1 Pain scale- 0   Review of systems- Review of Systems  Constitutional:  Negative for chills, fever, malaise/fatigue and weight loss.  HENT:  Negative for congestion, ear discharge and nosebleeds.   Eyes:  Negative for blurred vision.  Respiratory:  Negative for cough, hemoptysis, sputum production, shortness of breath and wheezing.   Cardiovascular:  Negative for chest pain, palpitations, orthopnea and claudication.  Gastrointestinal:  Negative for abdominal pain, blood in stool, constipation, diarrhea, heartburn, melena, nausea and vomiting.  Genitourinary:  Negative for dysuria, flank pain, frequency, hematuria and urgency.  Musculoskeletal:  Negative for back pain, joint pain and myalgias.  Skin:  Negative for rash.  Neurological:  Negative for dizziness, tingling, focal weakness, seizures, weakness and headaches.  Endo/Heme/Allergies:  Does not bruise/bleed easily.  Psychiatric/Behavioral:  Negative for depression and suicidal ideas. The patient does not have insomnia.       Allergies  Allergen Reactions   Bee Venom Anaphylaxis   Contrast Media [Iodinated Contrast Media] Anaphylaxis   Eggs Or Egg-Derived Products Anaphylaxis   Other Anaphylaxis    Surgical dye.. Pt states ok to take if previously taken benadryl or prednisone   Penicillins Hives and Other (See Comments)    Has patient had a PCN reaction causing immediate rash, facial/tongue/throat swelling, SOB or lightheadedness with hypotension: No Has patient had a PCN reaction causing severe rash involving mucus membranes or skin necrosis: No Has patient had a PCN reaction that required hospitalization No Has patient had a PCN reaction occurring within the  last 10 years: No If all of the above answers are "NO", then may proceed with  Cephalosporin use.   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[facility])    Pulmonary emboli ([place]) 2011   Pulmonary embolism ([facility])    Radiation    Brain tumor   UTI (urinary tract infection)    Vitamin D deficiency      Past Surgical History:  Procedure Laterality Date   CHOLECYSTECTOMY  1999   CYSTOSCOPY W/ RETROGRADES Left 03/01/2018   Procedure: CYSTOSCOPY WITH RETROGRADE PYELOGRAM;  Surgeon: Abbie Sons, MD;  Location: [facility];  Service: Urology;  Laterality: Left;   CYSTOSCOPY W/ URETERAL STENT PLACEMENT Right 04/08/2017   Procedure: CYSTOSCOPY WITH RETROGRADE PYELOGRAM/URETERAL STENT PLACEMENT;  Surgeon: Rana Snare, MD;  Location: [facility];  Service: Urology;  Laterality: Right;   CYSTOSCOPY W/ URETERAL STENT PLACEMENT Left 03/12/2018   Procedure: CYSTOSCOPY WITH STENT REPLACEMENT;  Surgeon: Abbie Sons, MD;  Location: [facility];  Service: Urology;  Laterality: Left;   CYSTOSCOPY W/ URETEROSCOPY     CYSTOSCOPY WITH RETROGRADE PYELOGRAM, URETEROSCOPY AND STENT PLACEMENT Left 01/20/2014   Procedure: LEFT URETEROSCOPY WITH STENT PLACEMENT;  Surgeon: Irine Seal, MD;  Location: [facility];  Service: Urology;  Laterality: Left;   CYSTOSCOPY WITH RETROGRADE PYELOGRAM, URETEROSCOPY AND STENT PLACEMENT Bilateral 04/23/2015   Procedure: CYSTOSCOPY WITH BILATERAL RETROGRADE PYELOGRAM, URETEROSCOPY AND STENT PLACEMENT;  Surgeon: Alexis Frock, MD;  Location: [facility];  Service:  Urology;  Laterality: Bilateral;   CYSTOSCOPY WITH RETROGRADE PYELOGRAM, URETEROSCOPY AND STENT PLACEMENT Right 04/13/2017   Procedure: CYSTOSCOPY WITH RETROGRADE PYELOGRAM, URETEROSCOPY AND STENT EXCHANGE;  Surgeon: Alexis Frock, MD;  Location: [facility];  Service: Urology;  Laterality: Right;   CYSTOSCOPY WITH STENT PLACEMENT Left 01/12/2014   Procedure: CYSTOSCOPY WITH STENT PLACEMENT left retrograde;  Surgeon: Irine Seal, MD;  Location: [facility];  Service: Urology;  Laterality: Left;   CYSTOSCOPY WITH STENT PLACEMENT Left 03/01/2018   Procedure: CYSTOSCOPY WITH STENT PLACEMENT;  Surgeon: Abbie Sons, MD;  Location: [facility];  Service: Urology;  Laterality: Left;   CYSTOSCOPY/URETEROSCOPY/HOLMIUM LASER/STENT PLACEMENT Left 03/12/2018   Procedure: CYSTOSCOPY/URETEROSCOPY/HOLMIUM LASER;  Surgeon: Abbie Sons, MD;  Location: [facility];  Service: Urology;  Laterality: Left;   CYSTOSCOPY/URETEROSCOPY/HOLMIUM LASER/STENT PLACEMENT Left 11/15/2021   Procedure: CYSTOSCOPY/URETEROSCOPY/HOLMIUM LASER/STENT PLACEMENT;  Surgeon: Abbie Sons, MD;  Location: [facility];  Service: Urology;  Laterality: Left;   HOLMIUM LASER APPLICATION Left 0/02/5464   Procedure: HOLMIUM LASER APPLICATION;  Surgeon: Irine Seal, MD;  Location: [facility];  Service: Urology;  Laterality: Left;   HOLMIUM LASER APPLICATION Bilateral 05/25/1274   Procedure: HOLMIUM LASER APPLICATION;  Surgeon: Alexis Frock, MD;  Location: [facility];  Service: Urology;  Laterality: Bilateral;   HOLMIUM LASER APPLICATION Right 1/70/0174   Procedure: HOLMIUM LASER APPLICATION;  Surgeon: Hubbard Robinson  Tresa Moore, MD;  Location: [facility];  Service: Urology;  Laterality: Right;   kidney stone removal     LITHOTRIPSY     PARATHYROIDECTOMY Right 11/13/2016   Procedure: RIGHT SUPERIOR PARATHYROIDECTOMY;  Surgeon: Armandina Gemma, MD;  Location: [facility];  Service: General;  Laterality: Right;   surgery for,ectopic pregnancy  2011    1 fallopian tube rupture   TUBAL LIGATION   2011    Social History   Socioeconomic History   Marital status: Divorced    Spouse name: Not on file   Number of children: 4   Years of education: Not on file   Highest education level: Some college, no degree  Occupational History   Not on file  Tobacco Use   Smoking status: Never   Smokeless tobacco: Never  Vaping Use   Vaping Use: Never used  Substance and Sexual Activity   Alcohol use: Not Currently   Drug use: Not Currently    Types: Marijuana   Sexual activity: Yes  Other Topics Concern   Not on file  Social History Narrative   Lives at home with her son, independent at baselinepend   Social Determinants of Health   Financial Resource Strain: Medium Risk (09/11/2018)   Overall Financial Resource Strain (CARDIA)    Difficulty of Paying Living Expenses: Somewhat hard  Food Insecurity: Food Insecurity Present (09/11/2018)   Hunger Vital Sign    Worried About Running Out of Food in the Last Year: Often true    Ran Out of Food in the Last Year: Often true  Transportation Needs: Unmet Transportation Needs (09/11/2018)   PRAPARE - Hydrologist (Medical): Yes    Lack of Transportation (Non-Medical): Yes  Physical Activity: Inactive (09/11/2018)   Exercise Vital Sign    Days of Exercise per Week: 0 days    Minutes of Exercise per Session: 0 min  Stress: Stress Concern Present (09/11/2018)   [place]    Feeling of Stress : Very much  Social Connections: Moderately Isolated (09/11/2018)   Social Connection and Isolation Panel [NHANES]    Frequency of Communication with Friends and Family: More than three times a week    Frequency of Social Gatherings with Friends and Family: More than three times a week    Attends Religious Services: Never    Marine scientist or Organizations: No    Attends Archivist Meetings: Never    Marital Status: Divorced  Arboriculturist Violence: Not At Risk (09/11/2018)   Humiliation, Afraid, Rape, and Kick questionnaire    Fear of Current or Ex-Partner: No    Emotionally Abused: No    Physically Abused: No    Sexually Abused: No    Family History  Problem Relation Age of Onset   Bell's palsy Mother    Heart failure Mother    Hypertension Mother    Lupus Mother    Anxiety disorder Mother    Depression Mother    Stroke Mother    Asthma Father    Hypertension Father    Hyperlipidemia Father    Anxiety disorder Father    Depression Father    Hypertension Maternal Grandmother    Diabetes Maternal Grandmother    Sarcoidosis Maternal Grandmother    Anxiety disorder Sister    Depression Sister    Bipolar disorder Sister    Depression Son    Bipolar disorder Son  Current Outpatient Medications:    cetirizine (ZYRTEC) 10 MG tablet, Take 1 tablet by mouth daily., Disp: , Rfl:    dasatinib (SPRYCEL) 100 MG tablet, Take 1 tablet (100 mg total) by mouth daily., Disp: 30 tablet, Rfl: 1   DULoxetine (CYMBALTA) 60 MG capsule, Take 1 capsule by mouth daily., Disp: , Rfl:    hydrOXYzine (ATARAX) 10 MG tablet, Take 10 mg by mouth 3 (three) times daily as needed for anxiety., Disp: , Rfl:    Vitamin D, Ergocalciferol, (DRISDOL) 1.25 MG (50000 UNIT) CAPS capsule, Take 50,000 Units by mouth once a week., Disp: , Rfl:    albuterol (PROVENTIL HFA;VENTOLIN HFA) 108 (90 Base) MCG/ACT inhaler, Inhale 2 puffs into the lungs every 6 (six) hours as needed for wheezing or shortness of breath. (Patient not taking: Reported on 05/09/2022), Disp: 1 Inhaler, Rfl: 2   albuterol (PROVENTIL) (2.5 MG/3ML) 0.083% nebulizer solution, Take 3 mLs (2.5 mg total) by nebulization every 6 (six) hours as needed for wheezing or shortness of breath. (Patient not taking: Reported on 05/09/2022), Disp: 75 mL, Rfl: 12   EPINEPHrine 0.3 mg/0.3 mL IJ SOAJ injection, Inject into the muscle. (Patient not taking: Reported on 05/09/2022), Disp: , Rfl:    Physical exam:  Vitals:   08/11/22 0949  BP: (!) 128/92  Pulse: 80  Resp: 18  SpO2: 95%  Weight: (!) 324 lb (147 kg)  Height: $Remove'5\' 6"'[id number]$  (1.676 m)   Physical Exam Constitutional:      General: She is not in acute distress.    Appearance: She is obese.  Cardiovascular:     Rate and Rhythm: Normal rate and regular rhythm.     Heart sounds: Normal heart sounds.  Pulmonary:     Effort: Pulmonary effort is normal.     Breath sounds: Normal breath sounds.  Abdominal:     General: Bowel sounds are normal.     Palpations: Abdomen is soft.  Musculoskeletal:     Comments: 2 dime size bruises noted over right leg  Skin:    General: Skin is warm and dry.  Neurological:     Mental Status: She is alert and oriented to person, place, and time.         Latest Ref Rng & Units 08/11/2022    9:32 AM  CMP  Glucose 70 - 99 mg/dL 116   BUN 6 - 20 mg/dL 14   Creatinine 0.44 - 1.00 mg/dL 0.77   Sodium 135 - 145 mmol/L 139   Potassium 3.5 - 5.1 mmol/L 3.6   Chloride 98 - 111 mmol/L 105   CO2 22 - 32 mmol/L 28   Calcium 8.9 - 10.3 mg/dL 8.9   Total Protein 6.5 - 8.1 g/dL 7.5   Total Bilirubin 0.3 - 1.2 mg/dL 0.5   Alkaline Phos 38 - 126 U/L 66   AST 15 - 41 U/L 21   ALT 0 - 44 U/L 15       Latest Ref Rng & Units 08/11/2022    9:32 AM  CBC  WBC 4.0 - 10.5 K/uL 6.3   Hemoglobin 12.0 - 15.0 g/dL 12.0   Hematocrit 36.0 - 46.0 % 37.9   Platelets 150 - 400 K/uL 258      Assessment and plan- Patient is a 51 y.o. female with chronic phase CML on  Dasatinib for routine follow-up  BCR ABL FISH and PCR testing from today is pending.  CBC has now normalized.  Her BCR ABL FISH testing at  the 28-monthmark showed BCR ABL transcripts of 2.5%.  Ideally it needs to be less than 1% at 6 months and we will see what those levels look like today.  BCR ABL PCR transcripts are also decreased significantly.  Overall I feel that the patient is on the right track and will continue to take Dasatinib until  progression or toxicity.  I will see her back in 3 months with CBC with differential CMP BCR ABL FISH and PCR testing as well as TSH.  She will need an echocardiogram prior as she has not had a baseline echocardiogram yet.   Visit Diagnosis 1. CML ([place]ronic myelocytic [place]) ([place]   2. High risk medication use      Dr. ARanda Evens MD, MPH [id number]/25/2023 1:09 PM

## 2022-08-11 NOTE — Progress Notes (Signed)
Patient reports easy bruising, bone aches, and gas pains

## 2022-08-15 LAB — BCR-ABL1 FISH
Cells Analyzed: 200
Cells Counted: 200

## 2022-08-16 ENCOUNTER — Other Ambulatory Visit (HOSPITAL_COMMUNITY): Payer: Self-pay

## 2022-08-16 LAB — BCR-ABL1, CML/ALL, PCR, QUANT: b2a2 transcript: 0.2493 %

## 2022-08-22 ENCOUNTER — Ambulatory Visit (INDEPENDENT_AMBULATORY_CARE_PROVIDER_SITE_OTHER): Payer: Medicaid Other

## 2022-08-22 ENCOUNTER — Encounter: Payer: Self-pay | Admitting: Sports Medicine

## 2022-08-22 ENCOUNTER — Ambulatory Visit (INDEPENDENT_AMBULATORY_CARE_PROVIDER_SITE_OTHER): Payer: Medicaid Other | Admitting: Sports Medicine

## 2022-08-22 VITALS — BP 130/83 | HR 76 | Ht 67.0 in | Wt 325.0 lb

## 2022-08-22 DIAGNOSIS — M542 Cervicalgia: Secondary | ICD-10-CM

## 2022-08-22 DIAGNOSIS — M5441 Lumbago with sciatica, right side: Secondary | ICD-10-CM | POA: Diagnosis not present

## 2022-08-22 DIAGNOSIS — M62838 Other muscle spasm: Secondary | ICD-10-CM

## 2022-08-22 DIAGNOSIS — G8929 Other chronic pain: Secondary | ICD-10-CM

## 2022-08-22 MED ORDER — GABAPENTIN 300 MG PO CAPS: 300.0000 mg | ORAL_CAPSULE | Freq: Two times a day (BID) | ORAL | 0 refills | Status: DC

## 2022-08-22 NOTE — Progress Notes (Signed)
Carol Ortega - 51 y.o. female MRN [id number]  Date of birth: [birth year]  Office Visit Note: Visit Date: 08/22/2022 PCP: Berkley Harvey, NP Referred by: Sindy Guadeloupe, MD  Subjective: Chief Complaint  Patient presents with   Lower Back - Pain   HPI: Carol Ortega is a pleasant 51 y.o. female who presents today for the conditions below:  Low back pain with RLE radicular symptoms -patient has an extensive history of 20+ years of right-sided low back and posterior buttock pain.  She states she was given the diagnosis of a sciatica before.  This started over 20 years ago after she gave birth to her son.  She chronically has numbness and tingling that extend down the posterior lateral aspect of the leg. She does take Tylenol occasionally.  Many years ago she was on gabapentin, but has been off of this for a while.  Recently, she had to undergo a bone biopsy about 4-5 months ago for her history of CML, this exacerbated her back pain.  She denies any weakness or giving out of the leg, but worsening of pain and numbness and tingling down the posterior lateral aspect of the leg.  Pain is worse at night when she is lying down.  Neck pain with right-sided radicular symptoms -over the last few months she has also had worsening of right sided neck pain that extends into the trapezius and upper shoulder.  She states the area feels tight.  She denies any numbness or tingling extending down the arm.  Pain is worse when she is trying to look upward.  This does bother her at night as well.  Occasionally takes Tylenol.  She denies any use of the right upper extremity.  Pertinent ROS were reviewed with the patient and found to be negative unless otherwise specified above in HPI.   Assessment & Plan: Visit Diagnoses:  1. Chronic right-sided low back pain with right-sided sciatica   2. Neck pain   3. Trapezius muscle spasm    Plan: Patient has had chronic low back pain with sciatica, that has been  exacerbated over the last 4 months after a bone biopsy for her CML.  It is reassuring she does not have muscular weakness, although she reports worsening numbness and tingling.  Her radicular symptoms do seem to fit the L5 dermatome, although she has quite significant pain throughout the low back and SI joint region even to touch.  I think it is key that we get her into physical therapy for the low back and sciatica-like issue.  We will start her on gabapentin 300 mg nightly and then transition to twice daily after 1 week if tolerating.  She may continue Tylenol as well.  In terms of her neck, I believe this is more of a postural and muscular issue.  Hopefully the gabapentin will help this.  I did give her some cervical isometrics and scapular retraction.  She will get into formalized physical therapy to work on her posture and muscle hypertonicity.  She will see me back in about 4 to 6 weeks after starting physical therapy.  If she is still having issues of the low back, we may consider MRI of the lumbar spine plus or minus EMG/NCS given the chronicity.  She may call or return sooner if any issues arise.  Follow-up: in 4-6 weeks after starting physical therapy   Meds & Orders:  Meds ordered this encounter  Medications   gabapentin (NEURONTIN) 300 MG capsule  Sig: Take 1 capsule (300 mg total) by mouth 2 (two) times daily.    Dispense:  60 capsule    Refill:  0    Orders Placed This Encounter  Procedures   XR Lumbar Spine 2-3 Views   XR Cervical Spine 2 or 3 views   Ambulatory referral to Physical Therapy     Clinical History: No specialty comments available.  She reports that she has never smoked. She has never used smokeless tobacco.  Recent Labs    11/08/21 1744 11/11/21 0421 11/21/21 0923  LABURIC 6.6 5.5 6.0    Objective:   Vital Signs: BP 130/83   Pulse 76   Ht '5\' 7"'$  (1.702 m)   Wt (!) 325 lb (147.4 kg)   LMP 06/18/2019   BMI 50.90 kg/m   Physical Exam  Gen:  Well-appearing, in no acute distress; non-toxic CV: Regular Rate. Well-perfused. Warm.  Resp: Breathing unlabored on room air; no wheezing. Psych: Fluid speech in conversation; appropriate affect; normal thought process Neuro: Sensation intact throughout. No gross coordination deficits.   Ortho Exam -Lumbar spine: There is no midline spinous process TTP, there is lumbar paraspinal hypertonicity and right SI joint TTP that is rather significant to light touch.  There is some mild limitation in flexion and extension secondary to pain.  Positive straight leg raise on the right, positive FABER test on the right. Negative FADIR test.  Strength 5/5 bilateral lower extremities, equivocal in the L4-S1 myotome.  Imaging: XR Lumbar Spine 2-3 Views  Result Date: 08/22/2022 2 views of the lumbar spine including AP and lateral films were ordered and reviewed by myself.  X-rays show no acute fracture noted.  There is grade 1 L4 on L5 anterolisthesis.  There is some mild L4-L5 DJD as well as at least moderate facet sclerosis of L4-L5.  XR Cervical Spine 2 or 3 views  Result Date: 08/22/2022 2 views of the cervical spine including AP and lateral films were ordered and reviewed by myself.  X-rays demonstrate flattening of the normal cervical lordotic curve.  There is some very mild DJD of C5-C6 of the IV discs.  No acute fracture or gross IV disc narrowing.   Past Medical/Family/Surgical/Social History: Medications & Allergies reviewed per EMR, new medications updated. 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 02/23/2020   Menorrhagia with irregular cycle 10/30/2019   Personal history of benign brain tumor 11/06/2018   Insomnia 07/30/2018   Chronic pain of both knees 07/30/2018   Morbid obesity due to excess  calories ([place]) 05/09/2018   Depression, recurrent ([place]) 05/09/2018   GAD (generalized anxiety disorder) 05/09/2018   DVT (deep venous thrombosis) ([place]) 04/15/2018   Bladder spasms 03/12/2018   Ureteral stone with hydronephrosis 02/28/2018   Kidney stone 04/09/2017   H/O pulmonary embolus during pregnancy 08/28/2016   S/P laparoscopic cholecystectomy 08/28/2016   Hyperparathyroidism ([place]) 09/10/2015   Vitamin D deficiency 06/25/2014   Hypercalcemia 02/09/2014   Essential hypertension 01/30/2014   Hyperlipidemia 01/30/2014   Nephrolithiasis, 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   Adult body mass index 50.0-59.9 ([place]) 03/25/2013   Anxiety state 03/25/2013   Asthma 03/25/2013   Obstructive sleep apnea 03/25/2013   Personal history of venous thrombosis and embolism 03/25/2013   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   Brain tumor ([place]) 12/18/2012   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)    Radiation    Brain tumor   UTI (urinary tract infection)    Vitamin D deficiency    Family History  Problem Relation Age of Onset   Bell's palsy Mother    Heart failure Mother    Hypertension Mother    Lupus Mother    Anxiety disorder Mother    Depression Mother    Stroke Mother    Asthma Father    Hypertension Father    Hyperlipidemia Father    Anxiety disorder Father    Depression Father    Hypertension Maternal Grandmother    Diabetes Maternal Grandmother    Sarcoidosis Maternal Grandmother     Anxiety disorder Sister    Depression Sister    Bipolar disorder Sister    Depression Son    Bipolar disorder Son    Past Surgical History:  Procedure Laterality Date   CHOLECYSTECTOMY  1999   CYSTOSCOPY W/ RETROGRADES Left 03/01/2018   Procedure: CYSTOSCOPY WITH RETROGRADE PYELOGRAM;  Surgeon: Abbie Sons, MD;  Location: [facility];  Service: Urology;  Laterality: Left;   CYSTOSCOPY W/ URETERAL STENT PLACEMENT Right 04/08/2017   Procedure: CYSTOSCOPY WITH RETROGRADE PYELOGRAM/URETERAL STENT PLACEMENT;  Surgeon: Rana Snare, MD;  Location: [facility];  Service: Urology;  Laterality: Right;   CYSTOSCOPY W/ URETERAL STENT PLACEMENT Left 03/12/2018   Procedure: CYSTOSCOPY WITH STENT REPLACEMENT;  Surgeon: Abbie Sons, MD;  Location: [facility];  Service: Urology;  Laterality: Left;   CYSTOSCOPY W/ URETEROSCOPY     CYSTOSCOPY WITH RETROGRADE PYELOGRAM, URETEROSCOPY AND STENT PLACEMENT Left 01/20/2014   Procedure: LEFT URETEROSCOPY WITH STENT PLACEMENT;  Surgeon: Irine Seal, MD;  Location: [facility];  Service: Urology;  Laterality: Left;   CYSTOSCOPY WITH RETROGRADE PYELOGRAM, URETEROSCOPY AND STENT PLACEMENT Bilateral 04/23/2015   Procedure: CYSTOSCOPY WITH BILATERAL RETROGRADE PYELOGRAM, URETEROSCOPY AND STENT PLACEMENT;  Surgeon: Alexis Frock, MD;  Location: [facility];  Service: Urology;  Laterality: Bilateral;   CYSTOSCOPY WITH RETROGRADE PYELOGRAM, URETEROSCOPY AND STENT PLACEMENT Right 04/13/2017   Procedure: CYSTOSCOPY WITH RETROGRADE PYELOGRAM, URETEROSCOPY AND STENT EXCHANGE;  Surgeon: Alexis Frock, MD;  Location[facility];  Service: Urology;  Laterality: Right;   CYSTOSCOPY WITH STENT PLACEMENT Left 01/12/2014   Procedure: CYSTOSCOPY WITH STENT PLACEMENT left retrograde;  Surgeon: Irine Seal, MD;  Location: [facility];  Service: Urology;  Laterality: Left;   CYSTOSCOPY WITH STENT PLACEMENT Left 03/01/2018   Procedure: CYSTOSCOPY WITH STENT PLACEMENT;  Surgeon: Abbie Sons, MD;  Location: [facility];  Service: Urology;  Laterality: Left;   CYSTOSCOPY/URETEROSCOPY/HOLMIUM LASER/STENT PLACEMENT Left 03/12/2018   Procedure: CYSTOSCOPY/URETEROSCOPY/HOLMIUM LASER;  Surgeon: Abbie Sons, MD;  Location: [facility];  Service: Urology;  Laterality: Left;   CYSTOSCOPY/URETEROSCOPY/HOLMIUM LASER/STENT PLACEMENT Left 11/15/2021   Procedure: CYSTOSCOPY/URETEROSCOPY/HOLMIUM LASER/STENT PLACEMENT;  Surgeon: Bernardo Heater,  Ronda Fairly, MD;  Location: [facility];  Service: Urology;  Laterality: Left;   HOLMIUM LASER APPLICATION Left 12/22/7827   Procedure: HOLMIUM LASER APPLICATION;  Surgeon: Irine Seal, MD;  [facility];  Service: Urology;  Laterality: Left;   HOLMIUM LASER APPLICATION Bilateral 04/22/2129   Procedure: HOLMIUM LASER APPLICATION;  Surgeon: Alexis Frock, MD;  Location[facility];  Service: Urology;  Laterality: Bilateral;   HOLMIUM LASER APPLICATION Right 8/65/7846   Procedure: HOLMIUM LASER APPLICATION;  Surgeon: Alexis Frock, MD;  Location: [facility];  Service: Urology;  Laterality: Right;   kidney stone removal     LITHOTRIPSY     PARATHYROIDECTOMY Right 11/13/2016   Procedure: RIGHT SUPERIOR PARATHYROIDECTOMY;  Surgeon: Armandina Gemma, MD;  Location: [facility];  Service: General;  Laterality: Right;   surgery for,ectopic pregnancy  2011    1 fallopian tube rupture   TUBAL LIGATION  2011   Social History   Occupational History   Not on file  Tobacco Use   Smoking status: Never   Smokeless tobacco: Never  Vaping Use   Vaping Use: Never used  Substance and Sexual Activity   Alcohol use: Not Currently   Drug use: Not Currently    Types: Marijuana   Sexual activity: Yes

## 2022-08-23 DIAGNOSIS — F4312 Post-traumatic stress disorder, chronic: Secondary | ICD-10-CM | POA: Diagnosis not present

## 2022-08-23 DIAGNOSIS — F330 Major depressive disorder, recurrent, mild: Secondary | ICD-10-CM | POA: Diagnosis not present

## 2022-08-24 ENCOUNTER — Other Ambulatory Visit (HOSPITAL_COMMUNITY): Payer: Self-pay

## 2022-09-04 DIAGNOSIS — F330 Major depressive disorder, recurrent, mild: Secondary | ICD-10-CM | POA: Diagnosis not present

## 2022-09-04 DIAGNOSIS — F4312 Post-traumatic stress disorder, chronic: Secondary | ICD-10-CM | POA: Diagnosis not present

## 2022-09-11 ENCOUNTER — Other Ambulatory Visit (HOSPITAL_COMMUNITY): Payer: Self-pay

## 2022-09-12 DIAGNOSIS — F330 Major depressive disorder, recurrent, mild: Secondary | ICD-10-CM | POA: Diagnosis not present

## 2022-09-12 DIAGNOSIS — F4312 Post-traumatic stress disorder, chronic: Secondary | ICD-10-CM | POA: Diagnosis not present

## 2022-09-15 ENCOUNTER — Other Ambulatory Visit (HOSPITAL_COMMUNITY): Payer: Self-pay

## 2022-09-19 DIAGNOSIS — F330 Major depressive disorder, recurrent, mild: Secondary | ICD-10-CM | POA: Diagnosis not present

## 2022-09-19 DIAGNOSIS — F4312 Post-traumatic stress disorder, chronic: Secondary | ICD-10-CM | POA: Diagnosis not present

## 2022-09-20 ENCOUNTER — Other Ambulatory Visit (HOSPITAL_COMMUNITY): Payer: Self-pay

## 2022-09-21 ENCOUNTER — Telehealth: Payer: Self-pay | Admitting: *Deleted

## 2022-09-21 NOTE — Telephone Encounter (Signed)
I called the office and got voiceamail. I got a fax to see if pt can have clearance for dental work.  I had to leave a message and wanted to know what kind of dental work is to be done in order to know if it is ok. Left my direct phone number and fax number.

## 2022-09-22 ENCOUNTER — Other Ambulatory Visit (HOSPITAL_COMMUNITY): Payer: Self-pay

## 2022-09-22 ENCOUNTER — Other Ambulatory Visit: Payer: Self-pay | Admitting: Medical Oncology

## 2022-09-22 DIAGNOSIS — C9210 Chronic myeloid leukemia, BCR/ABL-positive, not having achieved remission: Secondary | ICD-10-CM

## 2022-09-22 MED ORDER — DASATINIB 100 MG PO TABS
100.0000 mg | ORAL_TABLET | Freq: Every day | ORAL | 1 refills | Status: DC
  Filled 2022-09-22: qty 30, 30d supply, fill #0
  Filled 2022-10-19: qty 30, 30d supply, fill #1

## 2022-09-27 ENCOUNTER — Other Ambulatory Visit (HOSPITAL_COMMUNITY): Payer: Self-pay

## 2022-10-19 ENCOUNTER — Other Ambulatory Visit (HOSPITAL_COMMUNITY): Payer: Self-pay

## 2022-10-23 ENCOUNTER — Other Ambulatory Visit (HOSPITAL_COMMUNITY): Payer: Self-pay

## 2022-10-31 DIAGNOSIS — F4312 Post-traumatic stress disorder, chronic: Secondary | ICD-10-CM | POA: Diagnosis not present

## 2022-10-31 DIAGNOSIS — F330 Major depressive disorder, recurrent, mild: Secondary | ICD-10-CM | POA: Diagnosis not present

## 2022-11-02 ENCOUNTER — Ambulatory Visit
Admission: RE | Admit: 2022-11-02 | Discharge: 2022-11-02 | Disposition: A | Discharge status: Home / Self Care | Payer: Medicaid Other | Source: Ambulatory Visit | Attending: Oncology | Admitting: Oncology

## 2022-11-02 ENCOUNTER — Encounter: Payer: Self-pay | Admitting: Oncology

## 2022-11-02 DIAGNOSIS — I2699 Other pulmonary embolism without acute cor pulmonale: Secondary | ICD-10-CM | POA: Diagnosis not present

## 2022-11-02 DIAGNOSIS — C9210 Chronic myeloid leukemia, BCR/ABL-positive, not having achieved remission: Secondary | ICD-10-CM | POA: Insufficient documentation

## 2022-11-02 DIAGNOSIS — I10 Essential (primary) hypertension: Secondary | ICD-10-CM | POA: Diagnosis not present

## 2022-11-02 DIAGNOSIS — Z0189 Encounter for other specified special examinations: Secondary | ICD-10-CM | POA: Diagnosis not present

## 2022-11-02 LAB — ECHOCARDIOGRAM COMPLETE
AR max vel: 1.67 cm2
AV Area VTI: 2.01 cm2
AV Area mean vel: 1.66 cm2
AV Mean grad: 7.7 mmHg
AV Peak grad: 12.7 mmHg
Ao pk vel: 1.78 m/s
Area-P 1/2: 2.18 cm2
S' Lateral: 3.3 cm

## 2022-11-02 NOTE — Progress Notes (Signed)
*  PRELIMINARY RESULTS* Echocardiogram 2D Echocardiogram has been performed.  Sherrie Sport 11/02/2022, 10:45 AM

## 2022-11-03 ENCOUNTER — Telehealth: Payer: Self-pay | Admitting: *Deleted

## 2022-11-03 ENCOUNTER — Other Ambulatory Visit: Payer: Self-pay

## 2022-11-03 ENCOUNTER — Inpatient Hospital Stay: Payer: Medicaid Other | Attending: Oncology

## 2022-11-03 ENCOUNTER — Inpatient Hospital Stay (HOSPITAL_BASED_OUTPATIENT_CLINIC_OR_DEPARTMENT_OTHER): Payer: Medicaid Other | Admitting: Hospice and Palliative Medicine

## 2022-11-03 ENCOUNTER — Other Ambulatory Visit: Payer: Self-pay | Admitting: *Deleted

## 2022-11-03 VITALS — BP 130/90 | HR 73 | Temp 96.9°F | Resp 18 | Wt 316.0 lb

## 2022-11-03 DIAGNOSIS — Z86711 Personal history of pulmonary embolism: Secondary | ICD-10-CM | POA: Insufficient documentation

## 2022-11-03 DIAGNOSIS — Z79899 Other long term (current) drug therapy: Secondary | ICD-10-CM | POA: Diagnosis not present

## 2022-11-03 DIAGNOSIS — C9210 Chronic myeloid leukemia, BCR/ABL-positive, not having achieved remission: Secondary | ICD-10-CM

## 2022-11-03 DIAGNOSIS — R61 Generalized hyperhidrosis: Secondary | ICD-10-CM

## 2022-11-03 DIAGNOSIS — R5383 Other fatigue: Secondary | ICD-10-CM

## 2022-11-03 DIAGNOSIS — I10 Essential (primary) hypertension: Secondary | ICD-10-CM | POA: Insufficient documentation

## 2022-11-03 DIAGNOSIS — Z87440 Personal history of urinary (tract) infections: Secondary | ICD-10-CM | POA: Diagnosis not present

## 2022-11-03 DIAGNOSIS — R110 Nausea: Secondary | ICD-10-CM | POA: Insufficient documentation

## 2022-11-03 LAB — COMPREHENSIVE METABOLIC PANEL
ALT: 19 U/L (ref 0–44)
AST: 21 U/L (ref 15–41)
Albumin: 4 g/dL (ref 3.5–5.0)
Alkaline Phosphatase: 69 U/L (ref 38–126)
Anion gap: 9 (ref 5–15)
BUN: 11 mg/dL (ref 6–20)
CO2: 29 mmol/L (ref 22–32)
Calcium: 9.3 mg/dL (ref 8.9–10.3)
Chloride: 101 mmol/L (ref 98–111)
Creatinine, Ser: 0.71 mg/dL (ref 0.44–1.00)
GFR, Estimated: >60 mL/min (ref >60)
Glucose, Bld: 116 mg/dL — ABNORMAL HIGH (ref 70–99)
Potassium: 4.2 mmol/L (ref 3.5–5.1)
Sodium: 139 mmol/L (ref 135–145)
Total Bilirubin: 0.5 mg/dL (ref 0.3–1.2)
Total Protein: 8.4 g/dL — ABNORMAL HIGH (ref 6.5–8.1)

## 2022-11-03 LAB — URINALYSIS, COMPLETE (UACMP) WITH MICROSCOPIC
Bacteria, UA: NONE SEEN
Bilirubin Urine: NEGATIVE
Glucose, UA: NEGATIVE mg/dL
Hgb urine dipstick: NEGATIVE
Ketones, ur: NEGATIVE mg/dL
Leukocytes,Ua: NEGATIVE
Nitrite: NEGATIVE
Protein, ur: >=300 mg/dL — AB
Specific Gravity, Urine: 1.023 (ref 1.005–1.030)
pH: 6 (ref 5.0–8.0)

## 2022-11-03 LAB — IRON AND TIBC
Iron: 63 ug/dL (ref 28–170)
Saturation Ratios: 19 % (ref 10.4–31.8)
TIBC: 335 ug/dL (ref 250–450)
UIBC: 272 ug/dL

## 2022-11-03 LAB — CBC WITH DIFFERENTIAL/PLATELET
Abs Immature Granulocytes: 0.01 10*3/uL (ref 0.00–0.07)
Basophils Absolute: 0 10*3/uL (ref 0.0–0.1)
Basophils Relative: 1 %
Eosinophils Absolute: 0.1 10*3/uL (ref 0.0–0.5)
Eosinophils Relative: 2 %
HCT: 42.5 % (ref 36.0–46.0)
Hemoglobin: 13.1 g/dL (ref 12.0–15.0)
Immature Granulocytes: 0 %
Lymphocytes Relative: 47 %
Lymphs Abs: 2.8 10*3/uL (ref 0.7–4.0)
MCH: 27.8 pg (ref 26.0–34.0)
MCHC: 30.8 g/dL (ref 30.0–36.0)
MCV: 90 fL (ref 80.0–100.0)
Monocytes Absolute: 0.6 10*3/uL (ref 0.1–1.0)
Monocytes Relative: 10 %
Neutro Abs: 2.3 10*3/uL (ref 1.7–7.7)
Neutrophils Relative %: 40 %
Platelets: 251 10*3/uL (ref 150–400)
RBC: 4.72 MIL/uL (ref 3.87–5.11)
RDW: 15 % (ref 11.5–15.5)
WBC: 5.8 10*3/uL (ref 4.0–10.5)
nRBC: 0 % (ref 0.0–0.2)

## 2022-11-03 LAB — FERRITIN: Ferritin: 57 ng/mL (ref 11–307)

## 2022-11-03 LAB — TSH: TSH: 2.096 u[IU]/mL (ref 0.350–4.500)

## 2022-11-03 LAB — VITAMIN B12: Vitamin B-12: 216 pg/mL (ref 180–914)

## 2022-11-03 NOTE — Progress Notes (Signed)
Add on to smc. Lightheaded, nausea, joint pain, night sweats for the past 2 days. Worried as these are similar symptoms she had in the beginning of her diagnosis.

## 2022-11-03 NOTE — Telephone Encounter (Signed)
I called the pt today because in her my chart message she put her number in so I called her and she said : I have not been feeling well for about 2 days. Lightheaded, body aches, burning in my feet, headaches and dehydration. Can I please come in for bloodwork just to make sure none of my levels are out of wack. Thank you    Hey Ms. Carol Ortega, I called you because I saw the telephone  number in you rmy chart message but I let you know that you will come in 1 pm for labs and to see [facility] 1:15. The patient says she will be here at 1

## 2022-11-03 NOTE — Progress Notes (Signed)
Symptom Management [place] at [facility] [phone] [phone]  Patient Care Team: Berkley Harvey, NP as PCP - General (Nurse Practitioner) Sindy Guadeloupe, MD as Consulting Physician (Oncology)   NAME OF PATIENT: Carol Ortega  [id number]  [birth year]   DATE OF VISIT: 11/03/22  REASON FOR CONSULT: Deshonda Cryderman is a 51 y.o. 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   INTERVAL HISTORY: Patient last saw Dr. Janese Banks on 08/11/2022 and seem to be doing well.  Plan was to continue Dasatinib until progression or toxicity with 96-monthfollow-up.  [facility] hours of feeling increased fatigue and lightheaded.  However, she says that symptoms are better today.  She denies fever or chills.  She has occasional nausea but no vomiting.  She reports gassy pain but denies diarrhea or constipation.  Normal appetite.  She reports cloudy urine but denies dysuria or urinary frequency/urgency.  Patient says that her daughter also felt unwell yesterday.  Patient offers no further specific complaints today.  PAST MEDICAL HISTORY: Past Medical History:  Diagnosis Date   Anemia    Anxiety    Asthma    seasonal   [place] [place]    Brain tumor ([facility] [place]   tx with radiation   CML (chronic myelocytic leukemia) ([facility] 142/04/9562  Complication of anesthesia 1999   lung collapse after surgery for gallbladder   Depression    Ectopic pregnancy    Headache    migraines   Hoarseness of voice    Hypertension    Kidney stone    Obesity    Parathyroid abnormality (HCC)    Pulmonary emboli ([facility] [place]   Pulmonary embolism (HCC)    Radiation    Brain tumor   UTI (urinary tract infection)    Vitamin D deficiency     PAST SURGICAL HISTORY:  Past Surgical History:  Procedure Laterality Date   CHOLECYSTECTOMY  1999   CYSTOSCOPY W/ RETROGRADES Left 03/01/2018    Procedure: CYSTOSCOPY WITH RETROGRADE PYELOGRAM;  Surgeon: SAbbie Sons MD;  Location: [facility];  Service: Urology;  Laterality: Left;   CYSTOSCOPY W/ URETERAL STENT PLACEMENT Right 04/08/2017   Procedure: CYSTOSCOPY WITH RETROGRADE PYELOGRAM/URETERAL STENT PLACEMENT;  Surgeon: DRana Snare MD;  [facility];  Service: Urology;  Laterality: Right;   CYSTOSCOPY W/ URETERAL STENT PLACEMENT Left 03/12/2018   Procedure: CYSTOSCOPY WITH STENT REPLACEMENT;  Surgeon: SAbbie Sons MD;  Location: [facility];  Service: Urology;  Laterality: Left;   CYSTOSCOPY W/ URETEROSCOPY     CYSTOSCOPY WITH RETROGRADE PYELOGRAM, URETEROSCOPY AND STENT PLACEMENT Left 01/20/2014   Procedure: LEFT URETEROSCOPY WITH STENT PLACEMENT;  Surgeon: JIrine Seal MD;  Location: [facility];  Service: Urology;  Laterality: Left;   CYSTOSCOPY WITH RETROGRADE PYELOGRAM, URETEROSCOPY AND STENT PLACEMENT Bilateral 04/23/2015   Procedure: CYSTOSCOPY WITH BILATERAL RETROGRADE PYELOGRAM, URETEROSCOPY AND STENT PLACEMENT;  Surgeon: TAlexis Frock MD;  Location: [facility];  Service: Urology;  Laterality: Bilateral;   CYSTOSCOPY WITH RETROGRADE PYELOGRAM, URETEROSCOPY AND STENT PLACEMENT Right 04/13/2017   Procedure: CYSTOSCOPY WITH RETROGRADE PYELOGRAM, URETEROSCOPY AND STENT EXCHANGE;  Surgeon: TAlexis Frock MD;  [facility];  Service: Urology;  Laterality: Right;   CYSTOSCOPY WITH STENT PLACEMENT Left 01/12/2014   Procedure: CYSTOSCOPY WITH STENT PLACEMENT left retrograde;  Surgeon: JIrine Seal MD;  Location: [facility];  Service: Urology;  Laterality: Left;   CYSTOSCOPY WITH STENT PLACEMENT Left 03/01/2018  Procedure: CYSTOSCOPY WITH STENT PLACEMENT;  Surgeon: Abbie Sons, MD;  Location: [facility];  Service: Urology;  Laterality: Left;   CYSTOSCOPY/URETEROSCOPY/HOLMIUM LASER/STENT PLACEMENT Left 03/12/2018   Procedure: CYSTOSCOPY/URETEROSCOPY/HOLMIUM LASER;  Surgeon: Abbie Sons, MD;  Location: [facility];  Service: Urology;   Laterality: Left;   CYSTOSCOPY/URETEROSCOPY/HOLMIUM LASER/STENT PLACEMENT Left 11/15/2021   Procedure: CYSTOSCOPY/URETEROSCOPY/HOLMIUM LASER/STENT PLACEMENT;  Surgeon: Abbie Sons, MD;  Location: [facility];  Service: Urology;  Laterality: Left;   HOLMIUM LASER APPLICATION Left 05/25/3418   Procedure: HOLMIUM LASER APPLICATION;  Surgeon: Irine Seal, MD;  [facility];  Service: Urology;  Laterality: Left;   HOLMIUM LASER APPLICATION Bilateral 05/19/2296   Procedure: HOLMIUM LASER APPLICATION;  Surgeon: Alexis Frock, MD;  [facility];  Service: Urology;  Laterality: Bilateral;   HOLMIUM LASER APPLICATION Right 9/89/2119   Procedure: HOLMIUM LASER APPLICATION;  Surgeon: Alexis Frock, MD;  [facility];  Service: Urology;  Laterality: Right;   kidney stone removal     LITHOTRIPSY     PARATHYROIDECTOMY Right 11/13/2016   Procedure: RIGHT SUPERIOR PARATHYROIDECTOMY;  Surgeon: Armandina Gemma, MD;  Location: [place];  Service: General;  Laterality: Right;   surgery for,ectopic pregnancy  2011    1 fallopian tube rupture   TUBAL LIGATION  2011    HEMATOLOGY/ONCOLOGY HISTORY:  Oncology History  CML (chronic myelocytic leukemia) ([place])  11/21/2021 Initial Diagnosis   CML (chronic myelocytic leukemia) ([place])   11/21/2021 Cancer Staging   Staging form: Chronic Myeloid Leukemia, AJCC 8th Edition - Clinical stage from 11/21/2021: Ph+ - Signed by Sindy Guadeloupe, MD on 11/21/2021 Stage prefix: Initial diagnosis     ALLERGIES:  is allergic to bee venom, contrast media [iodinated contrast media], eggs or egg-derived products, other, penicillins, wellbutrin [bupropion], latex, and oxycodone-acetaminophen.  MEDICATIONS:  Current Outpatient Medications  Medication Sig Dispense Refill   albuterol (PROVENTIL HFA;VENTOLIN HFA) 108 (90 Base) MCG/ACT inhaler Inhale 2 puffs into the lungs every 6 (six) hours as needed for wheezing or shortness of breath. (Patient not taking: Reported on 05/09/2022) 1  Inhaler 2   albuterol (PROVENTIL) (2.5 MG/3ML) 0.083% nebulizer solution Take 3 mLs (2.5 mg total) by nebulization every 6 (six) hours as needed for wheezing or shortness of breath. (Patient not taking: Reported on 05/09/2022) 75 mL 12   cetirizine (ZYRTEC) 10 MG tablet Take 1 tablet by mouth daily.     dasatinib (SPRYCEL) 100 MG tablet Take 1 tablet (100 mg total) by mouth daily. 30 tablet 1   DULoxetine (CYMBALTA) 60 MG capsule Take 1 capsule by mouth daily.     EPINEPHrine 0.3 mg/0.3 mL IJ SOAJ injection Inject into the muscle. (Patient not taking: Reported on 05/09/2022)     gabapentin (NEURONTIN) 300 MG capsule Take 1 capsule (300 mg total) by mouth 2 (two) times daily. 60 capsule 0   hydrOXYzine (ATARAX) 10 MG tablet Take 10 mg by mouth 3 (three) times daily as needed for anxiety.     Vitamin D, Ergocalciferol, (DRISDOL) 1.25 MG (50000 UNIT) CAPS capsule Take 50,000 Units by mouth once a week.     No current facility-administered medications for this visit.    VITAL SIGNS: LMP 06/18/2019  There were no vitals filed for this visit.  Estimated body mass index is 50.9 kg/m as calculated from the following:   Height as of 08/22/22: '5\' 7"'$  (1.702 m).   Weight as of 08/22/22: 325 lb (147.4 kg).  LABS: CBC:    Component Value Date/Time  WBC 5.8 11/03/2022 1254   HGB 13.1 11/03/2022 1254   HGB 11.9 11/08/2021 1744   HCT 42.5 11/03/2022 1254   HCT 41.2 09/26/2018 1612   PLT 251 11/03/2022 1254   PLT 377 09/26/2018 1612   MCV 90.0 11/03/2022 1254   MCV 84 09/26/2018 1612   NEUTROABS 2.3 11/03/2022 1254   NEUTROABS 3.4 09/26/2018 1612   LYMPHSABS 2.8 11/03/2022 1254   LYMPHSABS 3.0 09/26/2018 1612   MONOABS 0.6 11/03/2022 1254   EOSABS 0.1 11/03/2022 1254   EOSABS 0.3 09/26/2018 1612   BASOSABS 0.0 11/03/2022 1254   BASOSABS 0.1 09/26/2018 1612   Comprehensive Metabolic Panel:    Component Value Date/Time   NA 139 08/11/2022 0932   NA 142 09/26/2018 1612   K 3.6 08/11/2022 0932    CL 105 08/11/2022 0932   CO2 28 08/11/2022 0932   BUN 14 08/11/2022 0932   BUN 9 09/26/2018 1612   CREATININE 0.77 08/11/2022 0932   GLUCOSE 116 (H) 08/11/2022 0932   CALCIUM 8.9 08/11/2022 0932   AST 21 08/11/2022 0932   ALT 15 08/11/2022 0932   ALKPHOS 66 08/11/2022 0932   BILITOT 0.5 08/11/2022 0932   BILITOT 0.3 09/26/2018 1612   PROT 7.5 08/11/2022 0932   PROT 7.7 09/26/2018 1612   ALBUMIN 3.8 08/11/2022 0932   ALBUMIN 4.4 09/26/2018 1612    RADIOGRAPHIC STUDIES: ECHOCARDIOGRAM COMPLETE  Result Date: 11/02/2022    ECHOCARDIOGRAM REPORT   Patient Name:   KALISI BEVILL Date of Exam: 11/02/2022 Medical Rec #:  [id number]        Height:       67.0 in Accession #:    [id number]       Weight:       325.0 lb Date of Birth:  Feb 15, 1971         BSA:          2.486 m Patient Age:    52 years         BP:           Not listed in chart/Not listed in                                                chart mmHg Patient Gender: F                HR:           Not listed in chart bpm. Exam Location:  [facility] Procedure: 2D Echo, Cardiac Doppler, Color Doppler and Strain Analysis Indications:     CML ( chronic myelocytic Leukemia C92.10  History:         Patient has no prior history of Echocardiogram examinations.                  Risk Factors:Hypertension. Pulmonary embolism.  Sonographer:     Sherrie Sport Referring Phys:  [id number] Weston Anna RAO Diagnosing Phys: Ida Rogue MD  Sonographer Comments: Global longitudinal strain was attempted. IMPRESSIONS  1. Left ventricular ejection fraction, by estimation, is 55 to 60%. The left ventricle has normal function. The left ventricle has no regional wall motion abnormalities. Left ventricular diastolic parameters are consistent with Grade I diastolic dysfunction (impaired relaxation).  2. Right ventricular systolic function is normal. The right ventricular size is normal. There is normal pulmonary artery systolic pressure. The estimated right  ventricular systolic  pressure is 99.8 mmHg.  3. The mitral valve is normal in structure. No evidence of mitral valve regurgitation. No evidence of mitral stenosis.  4. The aortic valve is normal in structure. Aortic valve regurgitation is not visualized. No aortic stenosis is present.  5. The inferior vena cava is normal in size with greater than 50% respiratory variability, suggesting right atrial pressure of 3 mmHg. FINDINGS  Left Ventricle: Left ventricular ejection fraction, by estimation, is 55 to 60%. The left ventricle has normal function. The left ventricle has no regional wall motion abnormalities. Global longitudinal strain performed but not reported based on interpreter judgement due to suboptimal tracking. The left ventricular internal cavity size was normal in size. There is no left ventricular hypertrophy. Left ventricular diastolic parameters are consistent with Grade I diastolic dysfunction (impaired relaxation). Right Ventricle: The right ventricular size is normal. No increase in right ventricular wall thickness. Right ventricular systolic function is normal. There is normal pulmonary artery systolic pressure. The tricuspid regurgitant velocity is 2.09 m/s, and  with an assumed right atrial pressure of 5 mmHg, the estimated right ventricular systolic pressure is 33.8 mmHg. Left Atrium: Left atrial size was normal in size. Right Atrium: Right atrial size was normal in size. Pericardium: There is no evidence of pericardial effusion. Mitral Valve: The mitral valve is normal in structure. No evidence of mitral valve regurgitation. No evidence of mitral valve stenosis. Tricuspid Valve: The tricuspid valve is normal in structure. Tricuspid valve regurgitation is not demonstrated. No evidence of tricuspid stenosis. Aortic Valve: The aortic valve is normal in structure. Aortic valve regurgitation is not visualized. No aortic stenosis is present. Aortic valve mean gradient measures 7.7 mmHg. Aortic valve peak gradient measures  12.7 mmHg. Aortic valve area, by VTI measures 2.01 cm. Pulmonic Valve: The pulmonic valve was normal in structure. Pulmonic valve regurgitation is not visualized. No evidence of pulmonic stenosis. Aorta: The aortic root is normal in size and structure. Venous: The inferior vena cava is normal in size with greater than 50% respiratory variability, suggesting right atrial pressure of 3 mmHg. IAS/Shunts: No atrial level shunt detected by color flow Doppler.  LEFT VENTRICLE PLAX 2D LVIDd:         4.90 cm   Diastology LVIDs:         3.30 cm   LV e' medial:    8.59 cm/s LV PW:         1.00 cm   LV E/e' medial:  12.6 LV IVS:        0.90 cm   LV e' lateral:   10.30 cm/s LVOT diam:     2.10 cm   LV E/e' lateral: 10.5 LV SV:         65 LV SV Index:   26        2D Longitudinal Strain LVOT Area:     3.46 cm  2D Strain GLS Avg:     -0.4 %                           3D Volume EF:                          3D EF:        54 %                          LV  EDV:       262 ml                          LV ESV:       122 ml                          LV SV:        140 ml RIGHT VENTRICLE RV Basal diam:  2.80 cm RV Mid diam:    2.90 cm RV S prime:     6.31 cm/s TAPSE (M-mode): 1.7 cm LEFT ATRIUM             Index        RIGHT ATRIUM           Index LA diam:        3.60 cm 1.45 cm/m   RA Area:     15.80 cm LA Vol (A2C):   42.6 ml 17.14 ml/m  RA Volume:   39.00 ml  15.69 ml/m LA Vol (A4C):   24.7 ml 9.94 ml/m LA Biplane Vol: 35.1 ml 14.12 ml/m  AORTIC VALVE AV Area (Vmax):    1.67 cm AV Area (Vmean):   1.66 cm AV Area (VTI):     2.01 cm AV Vmax:           178.33 cm/s AV Vmean:          128.667 cm/s AV VTI:            0.322 m AV Peak Grad:      12.7 mmHg AV Mean Grad:      7.7 mmHg LVOT Vmax:         86.20 cm/s LVOT Vmean:        61.700 cm/s LVOT VTI:          0.187 m LVOT/AV VTI ratio: 0.58  AORTA Ao Root diam: 2.60 cm MITRAL VALVE                TRICUSPID VALVE MV Area (PHT): 2.18 cm     TR Peak grad:   17.5 mmHg MV Decel Time: 349  msec     TR Vmax:        209.00 cm/s MV E velocity: 108.50 cm/s MV A velocity: 110.00 cm/s  SHUNTS MV E/A ratio:  0.99         Systemic VTI:  0.19 m                             Systemic Diam: 2.10 cm Ida Rogue MD Electronically signed by Ida Rogue MD Signature Date/Time: 11/02/2022/1:10:56 PM    Final     PERFORMANCE STATUS (ECOG) : 1 - Symptomatic but completely ambulatory  Review of Systems Unless otherwise noted, a complete review of systems is negative.  Physical Exam General: NAD Cardiovascular: regular rate and rhythm Pulmonary: clear ant fields Abdomen: soft, nontender, + bowel sounds GU: no suprapubic tenderness Extremities: no edema, no joint deformities Skin: no rashes Neurological: Nonfocal  IMPRESSION/PLAN: Fatigue -patient reports feeling improved today.  It appears that she could have had a sick contact from daughter who also felt similarly poor in the past 24 to 48 hours.  I question possible viral syndrome although patient has no other focal symptoms at this point.  Exam is benign.  Labs are grossly unchanged from baseline.  Recommended symptomatic care,  fluids, and rest.  Case and plan discussed with Dr. Janese Banks.  Patient will follow-up with Dr. Janese Banks as scheduled in December unless she needs to be seen sooner.   Patient expressed understanding and was in agreement with this plan. She also understands that She can call clinic at any time with any questions, concerns, or complaints.   Thank you for allowing me to participate in the care of this very pleasant patient.   Time Total: 15 minutes  Visit consisted of counseling and education dealing with the complex and emotionally intense issues of symptom management in the setting of serious illness.Greater than 50%  of this time was spent counseling and coordinating care related to the above assessment and plan.  Signed by: Altha Harm, PhD, NP-C

## 2022-11-04 LAB — URINE CULTURE: Culture: NO GROWTH

## 2022-11-07 LAB — BCR-ABL1 FISH
Cells Analyzed: 200
Cells Counted: 200

## 2022-11-08 NOTE — Progress Notes (Signed)
Reached out to pt regarding low vitamin b12 and per dr.rao pt is to begin taking b12 po 1026mg daily. Pt states her b12 always run low due to hypothyroidism and she will start the supplement but will also reach out to Endocrinology regarding her results. Pt understands and agrees that supplement can be found OTC.

## 2022-11-13 LAB — BCR-ABL1, CML/ALL, PCR, QUANT: b2a2 transcript: 0.2431 %

## 2022-11-15 ENCOUNTER — Other Ambulatory Visit (HOSPITAL_COMMUNITY): Payer: Self-pay

## 2022-11-17 ENCOUNTER — Other Ambulatory Visit: Payer: Self-pay | Admitting: *Deleted

## 2022-11-17 ENCOUNTER — Inpatient Hospital Stay: Payer: Medicaid Other | Attending: Oncology | Admitting: Oncology

## 2022-11-17 ENCOUNTER — Encounter: Payer: Self-pay | Admitting: Oncology

## 2022-11-17 ENCOUNTER — Inpatient Hospital Stay: Payer: Medicaid Other | Admitting: Oncology

## 2022-11-17 ENCOUNTER — Inpatient Hospital Stay: Payer: Medicaid Other

## 2022-11-17 ENCOUNTER — Other Ambulatory Visit: Payer: Self-pay | Admitting: Oncology

## 2022-11-17 ENCOUNTER — Other Ambulatory Visit (HOSPITAL_COMMUNITY): Payer: Self-pay

## 2022-11-17 DIAGNOSIS — C9210 Chronic myeloid leukemia, BCR/ABL-positive, not having achieved remission: Secondary | ICD-10-CM

## 2022-11-17 DIAGNOSIS — Z79899 Other long term (current) drug therapy: Secondary | ICD-10-CM | POA: Diagnosis not present

## 2022-11-17 MED ORDER — DASATINIB 100 MG PO TABS
100.0000 mg | ORAL_TABLET | Freq: Every day | ORAL | 1 refills | Status: DC
  Filled 2022-11-17: qty 30, 30d supply, fill #0
  Filled 2022-12-28: qty 30, 30d supply, fill #1

## 2022-11-17 NOTE — Progress Notes (Signed)
error 

## 2022-11-19 NOTE — Progress Notes (Signed)
I connected with Carol Ortega on 11/19/22 at  1:00 PM EST by video enabled telemedicine visit and verified that I am speaking with the correct person using two identifiers.   I discussed the limitations, risks, security and privacy concerns of performing an evaluation and management service by telemedicine and the availability of in-person appointments. I also discussed with the patient that there may be a patient responsible charge related to this service. The patient expressed understanding and agreed to proceed.  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  CT abdomen and pelvis without contrast showed moderate left hydronephrosis secondary to a 1.1 x 0.9 x 1.3 cm calculus at the left ureteropelvic junction/proximal left ureter.  No other evidence of adenopathy and spleen appeared normal.  Incidentally patient was noted to have a white cell count of 138 which on repeat check was 133.  Platelet counts normal and hemoglobin was 12.1.  Last normal CBC was in April 2021 when white count was 7.2.  Presently differential mainly shows neutrophilia along with lymphocytosis monocytosis basophilia and eosinophilia.  Immature granulocytes were seen.  Pathology smear review shows findings concerning for myeloproliferative neoplasm.  Blasts possibly less than 20% and BCR ABL testing was recommended.Patient noted to have wbc of 18 in may 2022 again with predominant left shift.   BCR ABL FISH testing showed 100% nuclei positive for BCR-ABL gene fusion signals.  BCR ABL PCR testing showed B2 A2 transcript 847.313 and B3 A2 transcript less than 0.0032%.  E1 A2 transcript 0.5645%.  Peripheral blood flow cytometry showedAberrant  myeloblast population about 2% of the leukocytes.  Absolute neutrophilia eosinophilia and basophilia.  Patient started Dasatinib in early December 2022. Bone marrow biopsy on 01/25/2021 showed hypercellular bone marrow with pan myeloid proliferation.  No increased number of blasts seen.  Interval history tolerating Dasatinib well without any significant side effects.  States that she has not missed any doses.   Review of Systems  Constitutional:  Negative for chills, fever, malaise/fatigue and weight loss.  HENT:  Negative for congestion, ear discharge and nosebleeds.   Eyes:  Negative for blurred vision.  Respiratory:  Negative for cough, hemoptysis, sputum production, shortness of breath and wheezing.   Cardiovascular:  Negative for chest pain, palpitations, orthopnea and claudication.  Gastrointestinal:  Negative for abdominal pain, blood in stool, constipation, diarrhea, heartburn, melena, nausea and vomiting.  Genitourinary:  Negative for dysuria, flank pain, frequency, hematuria and urgency.  Musculoskeletal:  Negative for back pain, joint pain and myalgias.  Skin:  Negative for rash.  Neurological:  Negative for dizziness, tingling, focal weakness, seizures, weakness and headaches.  Endo/Heme/Allergies:  Does not bruise/bleed easily.  Psychiatric/Behavioral:  Negative for depression and suicidal ideas. The patient does not have insomnia.     Allergies  Allergen Reactions   Bee Venom Anaphylaxis   Contrast Media [Iodinated Contrast Media] Anaphylaxis   Eggs Or Egg-Derived Products Anaphylaxis   Other Anaphylaxis    Surgical dye.. Pt states ok to take if previously taken benadryl or prednisone   Penicillins Hives and Other (See Comments)    Has patient had a PCN reaction causing immediate rash, facial/tongue/throat swelling, SOB or lightheadedness with hypotension: No Has patient had a PCN reaction causing severe rash involving  mucus membranes or skin necrosis: No Has patient had a  PCN reaction that required hospitalization No Has patient had a PCN reaction occurring within the last 10 years: No If all of the above answers are "NO", then may proceed with Cephalosporin use.   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[facility])    Pulmonary emboli ([place]) 2011   Pulmonary embolism ([facility])    Radiation    Brain tumor   UTI (urinary tract infection)    Vitamin D deficiency     Past Surgical History:  Procedure Laterality Date   CHOLECYSTECTOMY  1999   CYSTOSCOPY W/ RETROGRADES Left 03/01/2018   Procedure: CYSTOSCOPY WITH RETROGRADE PYELOGRAM;  Surgeon: Abbie Sons, MD;  Location: [facility];  Service: Urology;  Laterality: Left;   CYSTOSCOPY W/ URETERAL STENT PLACEMENT Right 04/08/2017   Procedure: CYSTOSCOPY WITH RETROGRADE PYELOGRAM/URETERAL STENT PLACEMENT;  Surgeon: Rana Snare, MD;  Location: [facility];  Service: Urology;  Laterality: Right;   CYSTOSCOPY W/ URETERAL STENT PLACEMENT Left 03/12/2018   Procedure: CYSTOSCOPY WITH STENT REPLACEMENT;  Surgeon: Abbie Sons, MD;  Location: [facility];  Service: Urology;  Laterality: Left;   CYSTOSCOPY W/ URETEROSCOPY     CYSTOSCOPY WITH RETROGRADE PYELOGRAM, URETEROSCOPY AND STENT PLACEMENT Left 01/20/2014   Procedure: LEFT URETEROSCOPY WITH STENT PLACEMENT;  Surgeon: Irine Seal, MD;  Location: [facility];  Service: Urology;  Laterality: Left;   CYSTOSCOPY WITH RETROGRADE PYELOGRAM, URETEROSCOPY AND STENT PLACEMENT  Bilateral 04/23/2015   Procedure: CYSTOSCOPY WITH BILATERAL RETROGRADE PYELOGRAM, URETEROSCOPY AND STENT PLACEMENT;  Surgeon: Alexis Frock, MD;  Location: [facility];  Service: Urology;  Laterality: Bilateral;   CYSTOSCOPY WITH RETROGRADE PYELOGRAM, URETEROSCOPY AND STENT PLACEMENT Right 04/13/2017   Procedure: CYSTOSCOPY WITH RETROGRADE PYELOGRAM, URETEROSCOPY AND STENT EXCHANGE;  Surgeon: Alexis Frock, MD;  [facility];  Service: Urology;  Laterality: Right;   CYSTOSCOPY WITH STENT PLACEMENT Left 01/12/2014   Procedure: CYSTOSCOPY WITH STENT PLACEMENT left retrograde;  Surgeon: Irine Seal, MD;  [facility];  Service: Urology;  Laterality: Left;   CYSTOSCOPY WITH STENT PLACEMENT Left 03/01/2018   Procedure: CYSTOSCOPY WITH STENT PLACEMENT;  Surgeon: Abbie Sons, MD;  Location: [facility];  Service: Urology;  Laterality: Left;   CYSTOSCOPY/URETEROSCOPY/HOLMIUM LASER/STENT PLACEMENT Left 03/12/2018   Procedure: CYSTOSCOPY/URETEROSCOPY/HOLMIUM LASER;  Surgeon: Abbie Sons, MD;  Location: [facility];  Service: Urology;  Laterality: Left;   CYSTOSCOPY/URETEROSCOPY/HOLMIUM LASER/STENT PLACEMENT Left 11/15/2021   Procedure: CYSTOSCOPY/URETEROSCOPY/HOLMIUM LASER/STENT PLACEMENT;  Surgeon: Abbie Sons, MD;  Location: [facility];  Service: Urology;  Laterality: Left;   HOLMIUM LASER APPLICATION Left 08/20/7168   Procedure: HOLMIUM LASER APPLICATION;  Surgeon: Irine Seal, MD;  [facility];  Service: Urology;  Laterality: Left;   HOLMIUM LASER APPLICATION Bilateral 05/24/8937   Procedure: HOLMIUM LASER APPLICATION;  Surgeon: Alexis Frock, MD;  Location: [facility];  Service: Urology;  Laterality: Bilateral;   HOLMIUM LASER APPLICATION Right 06/26/6268   Procedure: HOLMIUM LASER APPLICATION;  Surgeon: Alexis Frock, MD;  Location: [facility];  Service: Urology;  Laterality: Right;   kidney stone removal     LITHOTRIPSY     PARATHYROIDECTOMY Right 11/13/2016   Procedure: RIGHT SUPERIOR  PARATHYROIDECTOMY;  Surgeon: Armandina Gemma, MD;  Location: [facility];  Service: General;  Laterality: Right;   surgery for,ectopic pregnancy  2011    1 fallopian tube rupture   TUBAL LIGATION  2011    Social History   Socioeconomic History   Marital status: Divorced    Spouse name: Not on file   Number of children: 4   Years of education: Not on file   Highest education level: Some college, no degree  Occupational History   Not on file  Tobacco Use   Smoking status: Never   Smokeless tobacco: Never  Vaping Use   Vaping Use: Never used  Substance and Sexual Activity   Alcohol use: Not Currently   Drug use: Yes    Types: Marijuana   Sexual activity: Yes  Other Topics Concern   Not on file  Social History Narrative   Lives at home with her son, independent at baselinepend   Social Determinants of Health   Financial Resource Strain: Medium Risk (09/11/2018)   Overall Financial Resource Strain (CARDIA)    Difficulty of Paying Living Expenses: Somewhat hard  Food Insecurity: Food Insecurity Present (09/11/2018)   Hunger Vital Sign    Worried About Running Out of Food in the Last Year: Often true    Ran Out of Food in the Last Year: Often true  Transportation Needs: Unmet Transportation Needs (09/11/2018)   PRAPARE - Hydrologist (Medical): Yes    Lack of Transportation (Non-Medical): Yes  Physical Activity: Inactive (09/11/2018)   Exercise Vital Sign    Days of Exercise per Week: 0 days    Minutes of Exercise per Session: 0 min  Stress: Stress Concern Present (09/11/2018)   [place]    Feeling of Stress : Very much  Social Connections: Moderately Isolated (09/11/2018)   Social Connection and Isolation Panel [NHANES]    Frequency of Communication with Friends and Family: More than three times a week    Frequency of Social Gatherings with Friends and Family: More than three times a week     Attends Religious Services: Never    Marine scientist or Organizations: No    Attends Archivist Meetings: Never    Marital Status: Divorced  Human resources officer Violence: Not At Risk (09/11/2018)   Humiliation, Afraid, Rape, and Kick questionnaire    Fear of Current or Ex-Partner: No    Emotionally Abused: No    Physically Abused: No    Sexually Abused: No    Family History  Problem Relation Age of Onset   Bell's palsy Mother    Heart failure Mother    Hypertension Mother    Lupus Mother    Anxiety disorder Mother    Depression Mother    Stroke Mother    Asthma Father    Hypertension Father    Hyperlipidemia Father    Anxiety disorder Father    Depression Father    Hypertension Maternal Grandmother    Diabetes Maternal Grandmother    Sarcoidosis Maternal Grandmother    Anxiety disorder Sister    Depression Sister  Bipolar disorder Sister    Depression Son    Bipolar disorder Son      Current Outpatient Medications:    albuterol (PROVENTIL HFA;VENTOLIN HFA) 108 (90 Base) MCG/ACT inhaler, Inhale 2 puffs into the lungs every 6 (six) hours as needed for wheezing or shortness of breath., Disp: 1 Inhaler, Rfl: 2   albuterol (PROVENTIL) (2.5 MG/3ML) 0.083% nebulizer solution, Take 3 mLs (2.5 mg total) by nebulization every 6 (six) hours as needed for wheezing or shortness of breath., Disp: 75 mL, Rfl: 12   EPINEPHrine 0.3 mg/0.3 mL IJ SOAJ injection, Inject into the muscle., Disp: , Rfl:    dasatinib (SPRYCEL) 100 MG tablet, Take 1 tablet (100 mg total) by mouth daily., Disp: 30 tablet, Rfl: 1  ECHOCARDIOGRAM COMPLETE  Result Date: 11/02/2022    ECHOCARDIOGRAM REPORT   Patient Name:   Carol Ortega Date of Exam: 11/02/2022 Medical Rec #:  [id number]        Height:       67.0 in Accession #:    [id number]       Weight:       325.0 lb Date of Birth:  [birth year]         BSA:          2.486 m Patient Age:    78 years         BP:           Not listed in chart/Not  listed in                                                chart mmHg Patient Gender: F                HR:           Not listed in chart bpm. Exam Location:  [facility] Procedure: 2D Echo, Cardiac Doppler, Color Doppler and Strain Analysis Indications:     CML ( chronic myelocytic Leukemia C92.10  History:         Patient has no prior history of Echocardiogram examinations.                  Risk Factors:Hypertension. Pulmonary embolism.  Sonographer:     Sherrie Sport Referring Phys:  [id number] Weston Anna  Diagnosing Phys: Ida Rogue MD  Sonographer Comments: Global longitudinal strain was attempted. IMPRESSIONS  1. Left ventricular ejection fraction, by estimation, is 55 to 60%. The left ventricle has normal function. The left ventricle has no regional wall motion abnormalities. Left ventricular diastolic parameters are consistent with Grade I diastolic dysfunction (impaired relaxation).  2. Right ventricular systolic function is normal. The right ventricular size is normal. There is normal pulmonary artery systolic pressure. The estimated right ventricular systolic pressure is 00.7 mmHg.  3. The mitral valve is normal in structure. No evidence of mitral valve regurgitation. No evidence of mitral stenosis.  4. The aortic valve is normal in structure. Aortic valve regurgitation is not visualized. No aortic stenosis is present.  5. The inferior vena cava is normal in size with greater than 50% respiratory variability, suggesting right atrial pressure of 3 mmHg. FINDINGS  Left Ventricle: Left ventricular ejection fraction, by estimation, is 55 to 60%. The left ventricle has normal function. The left ventricle has no regional wall motion abnormalities. Global longitudinal strain performed but not reported based on  interpreter judgement due to suboptimal tracking. The left ventricular internal cavity size was normal in size. There is no left ventricular hypertrophy. Left ventricular diastolic parameters are consistent with  Grade I diastolic dysfunction (impaired relaxation). Right Ventricle: The right ventricular size is normal. No increase in right ventricular wall thickness. Right ventricular systolic function is normal. There is normal pulmonary artery systolic pressure. The tricuspid regurgitant velocity is 2.09 m/s, and  with an assumed right atrial pressure of 5 mmHg, the estimated right ventricular systolic pressure is 28.3 mmHg. Left Atrium: Left atrial size was normal in size. Right Atrium: Right atrial size was normal in size. Pericardium: There is no evidence of pericardial effusion. Mitral Valve: The mitral valve is normal in structure. No evidence of mitral valve regurgitation. No evidence of mitral valve stenosis. Tricuspid Valve: The tricuspid valve is normal in structure. Tricuspid valve regurgitation is not demonstrated. No evidence of tricuspid stenosis. Aortic Valve: The aortic valve is normal in structure. Aortic valve regurgitation is not visualized. No aortic stenosis is present. Aortic valve mean gradient measures 7.7 mmHg. Aortic valve peak gradient measures 12.7 mmHg. Aortic valve area, by VTI measures 2.01 cm. Pulmonic Valve: The pulmonic valve was normal in structure. Pulmonic valve regurgitation is not visualized. No evidence of pulmonic stenosis. Aorta: The aortic root is normal in size and structure. Venous: The inferior vena cava is normal in size with greater than 50% respiratory variability, suggesting right atrial pressure of 3 mmHg. IAS/Shunts: No atrial level shunt detected by color flow Doppler.  LEFT VENTRICLE PLAX 2D LVIDd:         4.90 cm   Diastology LVIDs:         3.30 cm   LV e' medial:    8.59 cm/s LV PW:         1.00 cm   LV E/e' medial:  12.6 LV IVS:        0.90 cm   LV e' lateral:   10.30 cm/s LVOT diam:     2.10 cm   LV E/e' lateral: 10.5 LV SV:         65 LV SV Index:   26        2D Longitudinal Strain LVOT Area:     3.46 cm  2D Strain GLS Avg:     -0.4 %                           3D  Volume EF:                          3D EF:        54 %                          LV EDV:       262 ml                          LV ESV:       122 ml                          LV SV:        140 ml RIGHT VENTRICLE RV Basal diam:  2.80 cm RV Mid diam:    2.90 cm RV S prime:     6.31 cm/s TAPSE (M-mode):  1.7 cm LEFT ATRIUM             Index        RIGHT ATRIUM           Index LA diam:        3.60 cm 1.45 cm/m   RA Area:     15.80 cm LA Vol (A2C):   42.6 ml 17.14 ml/m  RA Volume:   39.00 ml  15.69 ml/m LA Vol (A4C):   24.7 ml 9.94 ml/m LA Biplane Vol: 35.1 ml 14.12 ml/m  AORTIC VALVE AV Area (Vmax):    1.67 cm AV Area (Vmean):   1.66 cm AV Area (VTI):     2.01 cm AV Vmax:           178.33 cm/s AV Vmean:          128.667 cm/s AV VTI:            0.322 m AV Peak Grad:      12.7 mmHg AV Mean Grad:      7.7 mmHg LVOT Vmax:         86.20 cm/s LVOT Vmean:        61.700 cm/s LVOT VTI:          0.187 m LVOT/AV VTI ratio: 0.58  AORTA Ao Root diam: 2.60 cm MITRAL VALVE                TRICUSPID VALVE MV Area (PHT): 2.18 cm     TR Peak grad:   17.5 mmHg MV Decel Time: 349 msec     TR Vmax:        209.00 cm/s MV E velocity: 108.50 cm/s MV A velocity: 110.00 cm/s  SHUNTS MV E/A ratio:  0.99         Systemic VTI:  0.19 m                             Systemic Diam: 2.10 cm Ida Rogue MD Electronically signed by Ida Rogue MD Signature Date/Time: 11/02/2022/1:10:56 PM    Final     No images are attached to the encounter.      Latest Ref Rng & Units 11/03/2022   12:54 PM  CMP  Glucose 70 - 99 mg/dL 116   BUN 6 - 20 mg/dL 11   Creatinine 0.44 - 1.00 mg/dL 0.71   Sodium 135 - 145 mmol/L 139   Potassium 3.5 - 5.1 mmol/L 4.2   Chloride 98 - 111 mmol/L 101   CO2 22 - 32 mmol/L 29   Calcium 8.9 - 10.3 mg/dL 9.3   Total Protein 6.5 - 8.1 g/dL 8.4   Total Bilirubin 0.3 - 1.2 mg/dL 0.5   Alkaline Phos 38 - 126 U/L 69   AST 15 - 41 U/L 21   ALT 0 - 44 U/L 19       Latest Ref Rng & Units 11/03/2022   12:54 PM   CBC  WBC 4.0 - 10.5 K/uL 5.8   Hemoglobin 12.0 - 15.0 g/dL 13.1   Hematocrit 36.0 - 46.0 % 42.5   Platelets 150 - 400 K/uL 251      Observation/objective: Appears in no acute distress over video visit today.  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  BCR-ABL FISH was negative for gene rearrangement.  However BCR-ABL PCR continues to show 0.24% of the P2 10 transcript.  Overall it has been coming down over the last 6 months.  I am inclined to watch this conservatively with repeat labs again in 3 months and if there is persistence of the BCR-ABL PCR transcripts I will consider switching her to alternative therapies at that time.  I will also check EKG in my office at my next visit   Follow-up instructions: Labs and see me in 3 months  I discussed the assessment and treatment plan with the patient. The patient was provided an opportunity to ask questions and all were answered. The patient agreed with the plan and demonstrated an understanding of the instructions.   The patient was advised to call back or seek an in-person evaluation if the symptoms worsen or if the condition fails to improve as anticipated.  I provided 11 minutes of face-to-face video visit time during this encounter, and > 50% was spent counseling as documented under my assessment & plan.  Visit Diagnosis: 1. High risk medication use   2. CML (chronic myelocytic leukemia) ([place])     Dr. Randa Evens, MD, MPH [facility] Tel- [phone] 11/19/2022 5:02 PM

## 2022-11-21 ENCOUNTER — Other Ambulatory Visit (HOSPITAL_COMMUNITY): Payer: Self-pay

## 2022-11-29 DIAGNOSIS — F4312 Post-traumatic stress disorder, chronic: Secondary | ICD-10-CM | POA: Diagnosis not present

## 2022-11-29 DIAGNOSIS — F330 Major depressive disorder, recurrent, mild: Secondary | ICD-10-CM | POA: Diagnosis not present

## 2022-12-12 ENCOUNTER — Other Ambulatory Visit (HOSPITAL_COMMUNITY): Payer: Self-pay

## 2022-12-20 DIAGNOSIS — F330 Major depressive disorder, recurrent, mild: Secondary | ICD-10-CM | POA: Diagnosis not present

## 2022-12-20 DIAGNOSIS — F4312 Post-traumatic stress disorder, chronic: Secondary | ICD-10-CM | POA: Diagnosis not present

## 2022-12-27 ENCOUNTER — Other Ambulatory Visit (HOSPITAL_COMMUNITY): Payer: Self-pay

## 2022-12-28 ENCOUNTER — Other Ambulatory Visit (HOSPITAL_COMMUNITY): Payer: Self-pay

## 2023-01-02 ENCOUNTER — Other Ambulatory Visit (HOSPITAL_COMMUNITY): Payer: Self-pay

## 2023-01-23 DIAGNOSIS — Z1152 Encounter for screening for COVID-19: Secondary | ICD-10-CM | POA: Diagnosis not present

## 2023-01-24 ENCOUNTER — Other Ambulatory Visit (HOSPITAL_COMMUNITY): Payer: Self-pay

## 2023-01-26 ENCOUNTER — Other Ambulatory Visit: Payer: Self-pay

## 2023-01-26 ENCOUNTER — Other Ambulatory Visit (HOSPITAL_COMMUNITY): Payer: Self-pay

## 2023-01-26 ENCOUNTER — Other Ambulatory Visit: Payer: Self-pay | Admitting: Oncology

## 2023-01-26 DIAGNOSIS — C9210 Chronic myeloid leukemia, BCR/ABL-positive, not having achieved remission: Secondary | ICD-10-CM

## 2023-01-26 MED ORDER — DASATINIB 100 MG PO TABS
100.0000 mg | ORAL_TABLET | Freq: Every day | ORAL | 1 refills | Status: DC
  Filled 2023-01-26: qty 30, 30d supply, fill #0
  Filled 2023-02-26: qty 30, 30d supply, fill #1

## 2023-01-31 ENCOUNTER — Other Ambulatory Visit: Payer: Self-pay

## 2023-02-08 ENCOUNTER — Other Ambulatory Visit: Payer: Self-pay | Admitting: *Deleted

## 2023-02-08 DIAGNOSIS — C9210 Chronic myeloid leukemia, BCR/ABL-positive, not having achieved remission: Secondary | ICD-10-CM

## 2023-02-09 ENCOUNTER — Inpatient Hospital Stay: Payer: Medicaid Other | Attending: Oncology

## 2023-02-09 DIAGNOSIS — Z79899 Other long term (current) drug therapy: Secondary | ICD-10-CM | POA: Insufficient documentation

## 2023-02-09 DIAGNOSIS — Z87440 Personal history of urinary (tract) infections: Secondary | ICD-10-CM | POA: Insufficient documentation

## 2023-02-09 DIAGNOSIS — I10 Essential (primary) hypertension: Secondary | ICD-10-CM | POA: Diagnosis not present

## 2023-02-09 DIAGNOSIS — R110 Nausea: Secondary | ICD-10-CM | POA: Insufficient documentation

## 2023-02-09 DIAGNOSIS — Z86711 Personal history of pulmonary embolism: Secondary | ICD-10-CM | POA: Diagnosis not present

## 2023-02-09 DIAGNOSIS — C9210 Chronic myeloid leukemia, BCR/ABL-positive, not having achieved remission: Secondary | ICD-10-CM

## 2023-02-09 LAB — CBC WITH DIFFERENTIAL/PLATELET
Abs Immature Granulocytes: 0.02 10*3/uL (ref 0.00–0.07)
Basophils Absolute: 0 10*3/uL (ref 0.0–0.1)
Basophils Relative: 1 %
Eosinophils Absolute: 0.2 10*3/uL (ref 0.0–0.5)
Eosinophils Relative: 4 %
HCT: 39.5 % (ref 36.0–46.0)
Hemoglobin: 12.1 g/dL (ref 12.0–15.0)
Immature Granulocytes: 0 %
Lymphocytes Relative: 50 %
Lymphs Abs: 2.9 10*3/uL (ref 0.7–4.0)
MCH: 28 pg (ref 26.0–34.0)
MCHC: 30.6 g/dL (ref 30.0–36.0)
MCV: 91.4 fL (ref 80.0–100.0)
Monocytes Absolute: 0.6 10*3/uL (ref 0.1–1.0)
Monocytes Relative: 10 %
Neutro Abs: 2.1 10*3/uL (ref 1.7–7.7)
Neutrophils Relative %: 35 %
Platelets: 284 10*3/uL (ref 150–400)
RBC: 4.32 MIL/uL (ref 3.87–5.11)
RDW: 14.4 % (ref 11.5–15.5)
WBC: 5.9 10*3/uL (ref 4.0–10.5)
nRBC: 0 % (ref 0.0–0.2)

## 2023-02-09 LAB — COMPREHENSIVE METABOLIC PANEL
ALT: 18 U/L (ref 0–44)
AST: 21 U/L (ref 15–41)
Albumin: 3.6 g/dL (ref 3.5–5.0)
Alkaline Phosphatase: 65 U/L (ref 38–126)
Anion gap: 9 (ref 5–15)
BUN: 12 mg/dL (ref 6–20)
CO2: 28 mmol/L (ref 22–32)
Calcium: 9.2 mg/dL (ref 8.9–10.3)
Chloride: 101 mmol/L (ref 98–111)
Creatinine, Ser: 0.88 mg/dL (ref 0.44–1.00)
GFR, Estimated: >60 mL/min (ref >60)
Glucose, Bld: 103 mg/dL — ABNORMAL HIGH (ref 70–99)
Potassium: 4.3 mmol/L (ref 3.5–5.1)
Sodium: 138 mmol/L (ref 135–145)
Total Bilirubin: 0.4 mg/dL (ref 0.3–1.2)
Total Protein: 7.5 g/dL (ref 6.5–8.1)

## 2023-02-09 LAB — TSH: TSH: 1.645 u[IU]/mL (ref 0.350–4.500)

## 2023-02-13 ENCOUNTER — Ambulatory Visit
Admission: RE | Admit: 2023-02-13 | Discharge: 2023-02-13 | Disposition: A | Discharge status: Home / Self Care | Payer: Medicaid Other | Source: Ambulatory Visit | Attending: Oncology | Admitting: Oncology

## 2023-02-13 DIAGNOSIS — C9210 Chronic myeloid leukemia, BCR/ABL-positive, not having achieved remission: Secondary | ICD-10-CM | POA: Insufficient documentation

## 2023-02-13 DIAGNOSIS — I10 Essential (primary) hypertension: Secondary | ICD-10-CM | POA: Diagnosis not present

## 2023-02-13 DIAGNOSIS — Z0189 Encounter for other specified special examinations: Secondary | ICD-10-CM

## 2023-02-13 DIAGNOSIS — E785 Hyperlipidemia, unspecified: Secondary | ICD-10-CM | POA: Diagnosis not present

## 2023-02-13 DIAGNOSIS — E669 Obesity, unspecified: Secondary | ICD-10-CM | POA: Diagnosis not present

## 2023-02-13 LAB — ECHOCARDIOGRAM COMPLETE
AR max vel: 2.38 cm2
AV Area VTI: 2.69 cm2
AV Area mean vel: 2.46 cm2
AV Mean grad: 5 mmHg
AV Peak grad: 10.4 mmHg
Ao pk vel: 1.61 m/s
Area-P 1/2: 4.21 cm2
MV VTI: 2.6 cm2
S' Lateral: 3.1 cm

## 2023-02-13 NOTE — Addendum Note (Signed)
Encounter addended by: Elpidio Anis on: 02/13/2023 2:06 PM  Actions taken: Imaging Exam ended

## 2023-02-15 LAB — BCR-ABL1 FISH
Cells Analyzed: 200
Cells Counted: 200

## 2023-02-16 ENCOUNTER — Encounter: Payer: Self-pay | Admitting: Oncology

## 2023-02-16 ENCOUNTER — Other Ambulatory Visit: Payer: Self-pay | Admitting: *Deleted

## 2023-02-16 ENCOUNTER — Inpatient Hospital Stay: Payer: Medicaid Other | Attending: Oncology | Admitting: Oncology

## 2023-02-16 DIAGNOSIS — C9210 Chronic myeloid leukemia, BCR/ABL-positive, not having achieved remission: Secondary | ICD-10-CM

## 2023-02-16 DIAGNOSIS — I10 Essential (primary) hypertension: Secondary | ICD-10-CM | POA: Insufficient documentation

## 2023-02-16 DIAGNOSIS — Z79899 Other long term (current) drug therapy: Secondary | ICD-10-CM | POA: Insufficient documentation

## 2023-02-16 DIAGNOSIS — Z86711 Personal history of pulmonary embolism: Secondary | ICD-10-CM | POA: Diagnosis not present

## 2023-02-16 DIAGNOSIS — Z87440 Personal history of urinary (tract) infections: Secondary | ICD-10-CM | POA: Diagnosis not present

## 2023-02-16 NOTE — Progress Notes (Signed)
Patient states that she would like to showthe provider some swelling that she noticed around her eyes.

## 2023-02-17 DIAGNOSIS — Z1152 Encounter for screening for COVID-19: Secondary | ICD-10-CM | POA: Diagnosis not present

## 2023-02-17 LAB — BCR-ABL1, CML/ALL, PCR, QUANT: b2a2 transcript: 1.0713 %

## 2023-02-18 NOTE — Progress Notes (Signed)
Hematology/Oncology Consult note [facility]  [phone] [phone]  Patient Care Team: Berkley Harvey, NP as PCP - General (Nurse Practitioner) Sindy Guadeloupe, MD as Consulting Physician (Oncology)   Name of the patient: Carol Ortega  [id number]  04-23-1971   Date of visit: 02/18/23  Diagnosis- chronic phase CML  Chief complaint/ Reason for visit- routine f/u of CML on dasatinib  Heme/Onc history:  patient is a 52 year old African-American femaleWho presents to the ER with symptoms of left-sided flank pain.  CT abdomen and pelvis without contrast showed moderate left hydronephrosis secondary to a 1.1 x 0.9 x 1.3 cm calculus at the left ureteropelvic junction/proximal left ureter.  No other evidence of adenopathy and spleen appeared normal.  Incidentally patient was noted to have a white cell count of 138 which on repeat check was 133.  Platelet counts normal and hemoglobin was 12.1.  Last normal CBC was in April 2021 when white count was 7.2.  Presently differential mainly shows neutrophilia along with lymphocytosis monocytosis basophilia and eosinophilia.  Immature granulocytes were seen.  Pathology smear review shows findings concerning for myeloproliferative neoplasm.  Blasts possibly less than 20% and BCR ABL testing was recommended.Patient noted to have wbc of 18 in may 2022 again with predominant left shift.   BCR ABL FISH testing showed 100% nuclei positive for BCR-ABL gene fusion signals.  BCR ABL PCR testing showed B2 A2 transcript 847.313 and B3 A2 transcript less than 0.0032%.  E1 A2 transcript 0.5645%.  Peripheral blood flow cytometry showedAberrant myeloblast population about 2% of the leukocytes.  Absolute neutrophilia eosinophilia and basophilia.  Patient started Dasatinib in early December 2022. Bone marrow biopsy on 01/25/2021 showed hypercellular bone marrow with pan myeloid proliferation.  No increased number of blasts seen.     Interval history- patient feels well overall. She reports some symptoms of abdominal bloating which are overall self limited.   ECOG PS- 0 Pain scale- 0  Review of systems- Review of Systems  Constitutional:  Negative for chills, fever, malaise/fatigue and weight loss.  HENT:  Negative for congestion, ear discharge and nosebleeds.   Eyes:  Negative for blurred vision.  Respiratory:  Negative for cough, hemoptysis, sputum production, shortness of breath and wheezing.   Cardiovascular:  Negative for chest pain, palpitations, orthopnea and claudication.  Gastrointestinal:  Negative for abdominal pain, blood in stool, constipation, diarrhea, heartburn, melena, nausea and vomiting.  Genitourinary:  Negative for dysuria, flank pain, frequency, hematuria and urgency.  Musculoskeletal:  Negative for back pain, joint pain and myalgias.  Skin:  Negative for rash.  Neurological:  Negative for dizziness, tingling, focal weakness, seizures, weakness and headaches.  Endo/Heme/Allergies:  Does not bruise/bleed easily.  Psychiatric/Behavioral:  Negative for depression and suicidal ideas. The patient does not have insomnia.       Allergies  Allergen Reactions   Bee Venom Anaphylaxis   Contrast Media [Iodinated Contrast Media] Anaphylaxis   Eggs Or Egg-Derived Products Anaphylaxis   Other Anaphylaxis    Surgical dye.. Pt states ok to take if previously taken benadryl or prednisone   Penicillins Hives and Other (See Comments)    Has patient had a PCN reaction causing immediate rash, facial/tongue/throat swelling, SOB or lightheadedness with hypotension: No Has patient had a PCN reaction causing severe rash involving mucus membranes or skin necrosis: No Has patient had a PCN reaction that required hospitalization No Has patient had a PCN reaction occurring within the last 10 years: No If all  of the above answers are "NO", then may proceed with Cephalosporin use.   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[facility])    Pulmonary emboli ([place]) 2011   Pulmonary embolism ([facility])    Radiation    Brain tumor   UTI (urinary tract infection)    Vitamin D deficiency      Past Surgical History:  Procedure Laterality Date   CHOLECYSTECTOMY  1999   CYSTOSCOPY W/ RETROGRADES Left 03/01/2018   Procedure: CYSTOSCOPY WITH RETROGRADE PYELOGRAM;  Surgeon: Abbie Sons, MD;  Location: [facility];  Service: Urology;  Laterality: Left;   CYSTOSCOPY W/ URETERAL STENT PLACEMENT Right 04/08/2017   Procedure: CYSTOSCOPY WITH RETROGRADE PYELOGRAM/URETERAL STENT PLACEMENT;  Surgeon: Rana Snare, MD;  Location: [facility];  Service: Urology;  Laterality: Right;   CYSTOSCOPY W/ URETERAL STENT PLACEMENT Left 03/12/2018   Procedure: CYSTOSCOPY WITH STENT REPLACEMENT;  Surgeon: Abbie Sons, MD;  Location: [facility];  Service: Urology;  Laterality: Left;   CYSTOSCOPY W/ URETEROSCOPY     CYSTOSCOPY WITH RETROGRADE PYELOGRAM, URETEROSCOPY AND STENT PLACEMENT Left 01/20/2014   Procedure: LEFT URETEROSCOPY WITH STENT PLACEMENT;  Surgeon: Irine Seal, MD;  Location: [facility];  Service: Urology;  Laterality: Left;   CYSTOSCOPY WITH RETROGRADE PYELOGRAM, URETEROSCOPY AND STENT PLACEMENT Bilateral 04/23/2015   Procedure: CYSTOSCOPY WITH BILATERAL RETROGRADE PYELOGRAM, URETEROSCOPY AND STENT PLACEMENT;  Surgeon: Alexis Frock, MD;  Location: [facility];  Service: Urology;  Laterality: Bilateral;   CYSTOSCOPY WITH  RETROGRADE PYELOGRAM, URETEROSCOPY AND STENT PLACEMENT Right 04/13/2017   Procedure: CYSTOSCOPY WITH RETROGRADE PYELOGRAM, URETEROSCOPY AND STENT EXCHANGE;  Surgeon: Alexis Frock, MD;  Location: [facility];  Service: Urology;  Laterality: Right;   CYSTOSCOPY WITH STENT PLACEMENT Left 01/12/2014   Procedure: CYSTOSCOPY WITH STENT PLACEMENT left retrograde;  Surgeon: Irine Seal, MD;  Location: [facility];  Service: Urology;  Laterality: Left;   CYSTOSCOPY WITH STENT PLACEMENT Left 03/01/2018   Procedure: CYSTOSCOPY WITH STENT PLACEMENT;  Surgeon: Abbie Sons, MD;  Location: [facility];  Service: Urology;  Laterality: Left;   CYSTOSCOPY/URETEROSCOPY/HOLMIUM LASER/STENT PLACEMENT Left 03/12/2018   Procedure: CYSTOSCOPY/URETEROSCOPY/HOLMIUM LASER;  Surgeon: Abbie Sons, MD;  Location: [facility];  Service: Urology;  Laterality: Left;   CYSTOSCOPY/URETEROSCOPY/HOLMIUM LASER/STENT PLACEMENT Left 11/15/2021   Procedure: CYSTOSCOPY/URETEROSCOPY/HOLMIUM LASER/STENT PLACEMENT;  Surgeon: Abbie Sons, MD;  Location: [facility];  Service: Urology;  Laterality: Left;   HOLMIUM LASER APPLICATION Left [id number]   Procedure: HOLMIUM LASER APPLICATION;  Surgeon: Irine Seal, MD;  [facility];  Service: Urology;  Laterality: Left;   HOLMIUM LASER APPLICATION Bilateral [id number]   Procedure: HOLMIUM LASER APPLICATION;  Surgeon: Alexis Frock, MD;  Location: [facility];  Service: Urology;  Laterality: Bilateral;   HOLMIUM LASER APPLICATION Right [id number]   Procedure: HOLMIUM LASER APPLICATION;  Surgeon: Alexis Frock, MD;  Location: [facility];  Service: Urology;  Laterality: Right;   kidney stone removal     LITHOTRIPSY     PARATHYROIDECTOMY Right 11/13/2016   Procedure: RIGHT SUPERIOR PARATHYROIDECTOMY;  Surgeon: Armandina Gemma, MD;  Location: [facility];  Service: General;  Laterality: Right;   surgery for,ectopic pregnancy  2011    1 fallopian tube rupture   TUBAL LIGATION  2011    Social History   Socioeconomic  History   Marital status: Divorced    Spouse name: Not on file   Number of children: 4   Years of education: Not on file   Highest education level: Some college, no degree  Occupational History   Not on file  Tobacco Use   Smoking status: Never   Smokeless tobacco: Never  Vaping Use   Vaping Use: Never used  Substance and Sexual Activity   Alcohol use: Not Currently   Drug use: Yes    Types: Marijuana   Sexual activity: Yes  Other Topics Concern   Not on file  Social History Narrative   Lives at home with her son, independent at baselinepend   Social Determinants of Health   Financial Resource Strain: Medium Risk (09/11/2018)   Overall Financial Resource Strain (CARDIA)    Difficulty of Paying Living Expenses: Somewhat hard  Food Insecurity: Food Insecurity Present (09/11/2018)   Hunger Vital Sign    Worried About Running Out of Food in the Last Year: Often true    Ran Out of Food in the Last Year: Often true  Transportation Needs: Unmet Transportation Needs (09/11/2018)   PRAPARE - Hydrologist (Medical): Yes    Lack of Transportation (Non-Medical): Yes  Physical Activity: Inactive (09/11/2018)   Exercise Vital Sign    Days of Exercise per Week: 0 days    Minutes of Exercise per Session: 0 min  Stress: Stress Concern Present (09/11/2018)   [place]    Feeling of Stress : Very much  Social Connections: Moderately Isolated (09/11/2018)   Social Connection and Isolation Panel [NHANES]    Frequency of Communication with Friends and Family: More than three times a week    Frequency of Social Gatherings with Friends and Family: More than three times a week    Attends Religious Services: Never    Marine scientist or Organizations: No    Attends Archivist Meetings: Never    Marital Status: Divorced  Human resources officer Violence: Not At Risk (09/11/2018)   Humiliation,  Afraid, Rape, and Kick questionnaire    Fear of Current or Ex-Partner: No    Emotionally Abused: No    Physically Abused: No    Sexually Abused: No    Family History  Problem Relation Age of Onset   Bell's palsy Mother    Heart failure Mother    Hypertension Mother    Lupus Mother    Anxiety disorder Mother    Depression Mother    Stroke Mother    Asthma Father    Hypertension Father    Hyperlipidemia Father    Anxiety disorder Father    Depression Father    Hypertension Maternal Grandmother    Diabetes Maternal Grandmother    Sarcoidosis Maternal Grandmother    Anxiety disorder Sister    Depression Sister    Bipolar disorder Sister    Depression Son    Bipolar disorder Son      Current Outpatient Medications:    albuterol (PROVENTIL  HFA;VENTOLIN HFA) 108 (90 Base) MCG/ACT inhaler, Inhale 2 puffs into the lungs every 6 (six) hours as needed for wheezing or shortness of breath., Disp: 1 Inhaler, Rfl: 2   albuterol (PROVENTIL) (2.5 MG/3ML) 0.083% nebulizer solution, Take 3 mLs (2.5 mg total) by nebulization every 6 (six) hours as needed for wheezing or shortness of breath., Disp: 75 mL, Rfl: 12   cetirizine (ZYRTEC) 10 MG tablet, Take by mouth., Disp: , Rfl:    dasatinib (SPRYCEL) 100 MG tablet, Take 1 tablet (100 mg total) by mouth daily., Disp: 30 tablet, Rfl: 1   EPINEPHrine 0.3 mg/0.3 mL IJ SOAJ injection, Inject into the muscle., Disp: , Rfl:   Physical exam:  Vitals:   02/16/23 1029  BP: 126/84  Pulse: 84  Temp: 97.7 F (36.5 C)  TempSrc: Tympanic  SpO2: 100%  Weight: (!) 318 lb 14.4 oz (144.7 kg)  Height: '5\' 6"'$  (1.676 m)   Physical Exam Cardiovascular:     Rate and Rhythm: Normal rate and regular rhythm.     Heart sounds: Normal heart sounds.  Pulmonary:     Effort: Pulmonary effort is normal.     Breath sounds: Normal breath sounds.  Abdominal:     General: Bowel sounds are normal.     Palpations: Abdomen is soft.  Skin:    General: Skin is warm and  dry.  Neurological:     Mental Status: She is alert and oriented to person, place, and time.         Latest Ref Rng & Units 02/09/2023    2:45 PM  CMP  Glucose 70 - 99 mg/dL 103   BUN 6 - 20 mg/dL 12   Creatinine 0.44 - 1.00 mg/dL 0.88   Sodium 135 - 145 mmol/L 138   Potassium 3.5 - 5.1 mmol/L 4.3   Chloride 98 - 111 mmol/L 101   CO2 22 - 32 mmol/L 28   Calcium 8.9 - 10.3 mg/dL 9.2   Total Protein 6.5 - 8.1 g/dL 7.5   Total Bilirubin 0.3 - 1.2 mg/dL 0.4   Alkaline Phos 38 - 126 U/L 65   AST 15 - 41 U/L 21   ALT 0 - 44 U/L 18       Latest Ref Rng & Units 02/09/2023    2:45 PM  CBC  WBC 4.0 - 10.5 K/uL 5.9   Hemoglobin 12.0 - 15.0 g/dL 12.1   Hematocrit 36.0 - 46.0 % 39.5   Platelets 150 - 400 K/uL 284     No images are attached to the encounter.  ECHOCARDIOGRAM COMPLETE  Result Date: 02/13/2023    ECHOCARDIOGRAM REPORT   Patient Name:   CILIA PIEDRA Date of Exam: 02/13/2023 Medical Rec #:  [id number]        Height:       67.0 in Accession #:    [id number]       Weight:       316.0 lb Date of Birth:  10/26/1971         BSA:          2.456 m Patient Age:    52 years         BP:           130/90 mmHg Patient Gender: F                HR:           80 bpm. Exam Location:  [facility] Procedure: 2D Echo, Cardiac Doppler  and Color Doppler Indications:     Chronic myelogenous leukemia  History:         Patient has prior history of Echocardiogram examinations, most                  recent 11/02/2022. Risk Factors:Hypertension and Dyslipidemia.                  Chronic myelogenous leukemia.  Sonographer:     Wenda Low Referring Phys:  [id number] Weston Anna Jannie Doyle Diagnosing Phys: Nelva Bush MD  Sonographer Comments: Patient is obese. IMPRESSIONS  1. Left ventricular ejection fraction, by estimation, is 60 to 65%. The left ventricle has normal function. The left ventricle has no regional wall motion abnormalities. Left ventricular diastolic parameters were normal.  2. Right ventricular  systolic function is normal. The right ventricular size is normal. Tricuspid regurgitation signal is inadequate for assessing PA pressure.  3. The mitral valve is normal in structure. Trivial mitral valve regurgitation. No evidence of mitral stenosis.  4. The aortic valve is tricuspid. Aortic valve regurgitation is not visualized. No aortic stenosis is present.  5. The inferior vena cava is normal in size with greater than 50% respiratory variability, suggesting right atrial pressure of 3 mmHg. FINDINGS  Left Ventricle: Left ventricular ejection fraction, by estimation, is 60 to 65%. The left ventricle has normal function. The left ventricle has no regional wall motion abnormalities. The left ventricular internal cavity size was normal in size. There is  no left ventricular hypertrophy. Left ventricular diastolic parameters were normal. Right Ventricle: The right ventricular size is normal. No increase in right ventricular wall thickness. Right ventricular systolic function is normal. Tricuspid regurgitation signal is inadequate for assessing PA pressure. Left Atrium: Left atrial size was normal in size. Right Atrium: Right atrial size was normal in size. Pericardium: There is no evidence of pericardial effusion. Mitral Valve: The mitral valve is normal in structure. Mild mitral annular calcification. Trivial mitral valve regurgitation. No evidence of mitral valve stenosis. MV peak gradient, 6.9 mmHg. The mean mitral valve gradient is 3.0 mmHg. Tricuspid Valve: The tricuspid valve is not well visualized. Tricuspid valve regurgitation is not demonstrated. Aortic Valve: The aortic valve is tricuspid. Aortic valve regurgitation is not visualized. No aortic stenosis is present. Aortic valve mean gradient measures 5.0 mmHg. Aortic valve peak gradient measures 10.4 mmHg. Aortic valve area, by VTI measures 2.69  cm. Pulmonic Valve: The pulmonic valve was not well visualized. Pulmonic valve regurgitation is trivial. No  evidence of pulmonic stenosis. Aorta: The aortic root is normal in size and structure. Venous: The inferior vena cava is normal in size with greater than 50% respiratory variability, suggesting right atrial pressure of 3 mmHg. IAS/Shunts: The interatrial septum was not well visualized.  LEFT VENTRICLE PLAX 2D LVIDd:         5.10 cm   Diastology LVIDs:         3.10 cm   LV e' medial:    10.20 cm/s LV PW:         0.99 cm   LV E/e' medial:  11.1 LV IVS:        0.80 cm   LV e' lateral:   13.10 cm/s LVOT diam:     2.00 cm   LV E/e' lateral: 8.6 LV SV:         85 LV SV Index:   35 LVOT Area:     3.14 cm  RIGHT VENTRICLE RV S prime:  11.50 cm/s LEFT ATRIUM             Index        RIGHT ATRIUM           Index LA diam:        4.00 cm 1.63 cm/m   RA Area:     17.70 cm LA Vol (A2C):   70.9 ml 28.87 ml/m  RA Volume:   51.40 ml  20.93 ml/m LA Vol (A4C):   53.6 ml 21.82 ml/m LA Biplane Vol: 65.7 ml 26.75 ml/m  AORTIC VALVE                    PULMONIC VALVE AV Area (Vmax):    2.38 cm     PV Vmax:       1.15 m/s AV Area (Vmean):   2.46 cm     PV Peak grad:  5.3 mmHg AV Area (VTI):     2.69 cm AV Vmax:           161.00 cm/s AV Vmean:          96.300 cm/s AV VTI:            0.317 m AV Peak Grad:      10.4 mmHg AV Mean Grad:      5.0 mmHg LVOT Vmax:         122.00 cm/s LVOT Vmean:        75.500 cm/s LVOT VTI:          0.271 m LVOT/AV VTI ratio: 0.85  AORTA Ao Root diam: 3.10 cm MITRAL VALVE MV Area (PHT): 4.21 cm     SHUNTS MV Area VTI:   2.60 cm     Systemic VTI:  0.27 m MV Peak grad:  6.9 mmHg     Systemic Diam: 2.00 cm MV Mean grad:  3.0 mmHg MV Vmax:       1.31 m/s MV Vmean:      83.9 cm/s MV Decel Time: 180 msec MV E velocity: 113.00 cm/s MV A velocity: 108.00 cm/s MV E/A ratio:  1.05 Harrell Gave End MD Electronically signed by Nelva Bush MD Signature Date/Time: 02/13/2023/5:35:02 PM    Final      Assessment and plan- Patient is a 52 y.o. female with chronic phase CML on dasatinib here for routine  f/u  Patient has been on dasatinib sice December 2022. Bcr abl fish transcripts have been ndetectable but BCR ABL FISH PCR is still detectable. Last value was 0.2%. I am awaiting numbers from February. If they are higher and >1%, I will consider doing mutational analysis and switching her to alternative Tki. For now I am monitoring he rlevels closely and see her back in 2 months with labs.EKG was done in our office today and QTc was normal.      Visit Diagnosis 1. High risk medication use   2. CML (chronic myelocytic leukemia) ([place])      Dr. Randa Evens, MD, MPH [facility] at [facility] [id number] 02/18/2023 12:21 PM

## 2023-02-21 ENCOUNTER — Other Ambulatory Visit (HOSPITAL_COMMUNITY): Payer: Self-pay

## 2023-02-21 DIAGNOSIS — F4312 Post-traumatic stress disorder, chronic: Secondary | ICD-10-CM | POA: Diagnosis not present

## 2023-02-26 ENCOUNTER — Other Ambulatory Visit (HOSPITAL_COMMUNITY): Payer: Self-pay

## 2023-02-28 ENCOUNTER — Other Ambulatory Visit (HOSPITAL_COMMUNITY): Payer: Self-pay

## 2023-03-05 DIAGNOSIS — Z1159 Encounter for screening for other viral diseases: Secondary | ICD-10-CM | POA: Diagnosis not present

## 2023-03-06 DIAGNOSIS — Z1159 Encounter for screening for other viral diseases: Secondary | ICD-10-CM | POA: Diagnosis not present

## 2023-03-09 ENCOUNTER — Inpatient Hospital Stay: Payer: Medicaid Other | Admitting: Oncology

## 2023-03-15 ENCOUNTER — Inpatient Hospital Stay (HOSPITAL_BASED_OUTPATIENT_CLINIC_OR_DEPARTMENT_OTHER): Payer: Medicaid Other | Admitting: Oncology

## 2023-03-15 ENCOUNTER — Encounter: Payer: Self-pay | Admitting: Oncology

## 2023-03-15 DIAGNOSIS — Z79899 Other long term (current) drug therapy: Secondary | ICD-10-CM

## 2023-03-15 DIAGNOSIS — C9210 Chronic myeloid leukemia, BCR/ABL-positive, not having achieved remission: Secondary | ICD-10-CM | POA: Diagnosis not present

## 2023-03-15 DIAGNOSIS — Z7189 Other specified counseling: Secondary | ICD-10-CM | POA: Diagnosis not present

## 2023-03-18 NOTE — Progress Notes (Signed)
I connected with Carol Ortega on 03/18/23 at 11:05 AM EDT by video enabled telemedicine visit and verified that I am speaking with the correct person using two identifiers.   I discussed the limitations, risks, security and privacy concerns of performing an evaluation and management service by telemedicine and the availability of in-person appointments. I also discussed with the patient that there may be a patient responsible charge related to this service. The patient expressed understanding and agreed to proceed.  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  CT abdomen and pelvis without contrast showed moderate left hydronephrosis secondary to a 1.1 x 0.9 x 1.3 cm calculus at the left ureteropelvic junction/proximal left ureter.  No other evidence of adenopathy and spleen appeared normal.  Incidentally patient was noted to have a white cell count of 138 which on repeat check was 133.  Platelet counts normal and hemoglobin was 12.1.  Last normal CBC was in April 2021 when white count was 7.2.  Presently differential mainly shows neutrophilia along with lymphocytosis monocytosis basophilia and eosinophilia.  Immature granulocytes were seen.  Pathology smear review shows findings concerning for myeloproliferative neoplasm.  Blasts possibly less than 20% and BCR ABL testing was recommended.Patient noted to have wbc of 18 in may 2022 again with predominant left shift.   BCR ABL FISH testing showed 100% nuclei positive for BCR-ABL gene fusion signals.  BCR ABL PCR testing showed B2 A2 transcript 847.313 and B3 A2 transcript less than 0.0032%.  E1 A2 transcript 0.5645%.  Peripheral blood flow cytometry showedAberrant  myeloblast population about 2% of the leukocytes.  Absolute neutrophilia eosinophilia and basophilia.  Patient started Dasatinib in early December 2022. Bone marrow biopsy on 01/25/2021 showed hypercellular bone marrow with pan myeloid proliferation.  No increased number of blasts seen.   Interval history: she is tolerating sprycel well. Denies missing any doses.   Review of Systems  Constitutional:  Negative for chills, fever, malaise/fatigue and weight loss.  HENT:  Negative for congestion, ear discharge and nosebleeds.   Eyes:  Negative for blurred vision.  Respiratory:  Negative for cough, hemoptysis, sputum production, shortness of breath and wheezing.   Cardiovascular:  Negative for chest pain, palpitations, orthopnea and claudication.  Gastrointestinal:  Negative for abdominal pain, blood in stool, constipation, diarrhea, heartburn, melena, nausea and vomiting.  Genitourinary:  Negative for dysuria, flank pain, frequency, hematuria and urgency.  Musculoskeletal:  Negative for back pain, joint pain and myalgias.  Skin:  Negative for rash.  Neurological:  Negative for dizziness, tingling, focal weakness, seizures, weakness and headaches.  Endo/Heme/Allergies:  Does not bruise/bleed easily.  Psychiatric/Behavioral:  Negative for depression and suicidal ideas. The patient does not have insomnia.     Allergies  Allergen Reactions   Bee Venom Anaphylaxis   Contrast Media [Iodinated Contrast Media] Anaphylaxis   Egg-Derived Products Anaphylaxis   Other Anaphylaxis    Surgical dye.. Pt states ok to take if previously taken benadryl or prednisone   Penicillins Hives and Other (See Comments)    Has patient had a PCN reaction causing immediate rash, facial/tongue/throat swelling, SOB or lightheadedness with hypotension: No Has patient had a PCN reaction causing severe rash involving mucus membranes or skin necrosis: No Has patient had  a PCN reaction that required hospitalization No Has  patient had a PCN reaction occurring within the last 10 years: No If all of the above answers are "NO", then may proceed with Cephalosporin use.   Wellbutrin [Bupropion] Other (See Comments)    irritiability   Latex Rash   Oxycodone-Acetaminophen Itching    Past Medical History:  Diagnosis Date   Anemia    Anxiety    Asthma    seasonal   Benign brain tumor ([place])    Brain tumor ([place]) 2014   tx with radiation   CML (chronic myelocytic leukemia) ([place]) [id number]   Complication of anesthesia 1999   lung collapse after surgery for gallbladder   Depression    Ectopic pregnancy    Headache    migraines   Hoarseness of voice    Hypertension    Kidney stone    Obesity    Parathyroid abnormality ([facility])    Pulmonary emboli ([place]) 2011   Pulmonary embolism (H[facility])    Radiation    Brain tumor   UTI (urinary tract infection)    Vitamin D deficiency     Past Surgical History:  Procedure Laterality Date   CHOLECYSTECTOMY  1999   CYSTOSCOPY W/ RETROGRADES Left 03/01/2018   Procedure: CYSTOSCOPY WITH RETROGRADE PYELOGRAM;  Surgeon: Abbie Sons, MD;  Location: [facility];  Service: Urology;  Laterality: Left;   CYSTOSCOPY W/ URETERAL STENT PLACEMENT Right 04/08/2017   Procedure: CYSTOSCOPY WITH RETROGRADE PYELOGRAM/URETERAL STENT PLACEMENT;  Surgeon: Rana Snare, MD;  Location: [facility];  Service: Urology;  Laterality: Right;   CYSTOSCOPY W/ URETERAL STENT PLACEMENT Left 03/12/2018   Procedure: CYSTOSCOPY WITH STENT REPLACEMENT;  Surgeon: Abbie Sons, MD;  Location: [facility];  Service: Urology;  Laterality: Left;   CYSTOSCOPY W/ URETEROSCOPY     CYSTOSCOPY WITH RETROGRADE PYELOGRAM, URETEROSCOPY AND STENT PLACEMENT Left 01/20/2014   Procedure: LEFT URETEROSCOPY WITH STENT PLACEMENT;  Surgeon: Irine Seal, MD;  Location: [facility];  Service: Urology;  Laterality: Left;   CYSTOSCOPY WITH RETROGRADE PYELOGRAM, URETEROSCOPY AND STENT PLACEMENT Bilateral 04/23/2015   Procedure: CYSTOSCOPY WITH  BILATERAL RETROGRADE PYELOGRAM, URETEROSCOPY AND STENT PLACEMENT;  Surgeon: Alexis Frock, MD;  Location: [facility];  Service: Urology;  Laterality: Bilateral;   CYSTOSCOPY WITH RETROGRADE PYELOGRAM, URETEROSCOPY AND STENT PLACEMENT Right 04/13/2017   Procedure: CYSTOSCOPY WITH RETROGRADE PYELOGRAM, URETEROSCOPY AND STENT EXCHANGE;  Surgeon: Alexis Frock, MD;  Location: [facility];  Service: Urology;  Laterality: Right;   CYSTOSCOPY WITH STENT PLACEMENT Left 01/12/2014   Procedure: CYSTOSCOPY WITH STENT PLACEMENT left retrograde;  Surgeon: Irine Seal, MD;  Location: [facility];  Service: Urology;  Laterality: Left;   CYSTOSCOPY WITH STENT PLACEMENT Left 03/01/2018   Procedure: CYSTOSCOPY WITH STENT PLACEMENT;  Surgeon: Abbie Sons, MD;  Location: [facility];  Service: Urology;  Laterality: Left;   CYSTOSCOPY/URETEROSCOPY/HOLMIUM LASER/STENT PLACEMENT Left 03/12/2018   Procedure: CYSTOSCOPY/URETEROSCOPY/HOLMIUM LASER;  Surgeon: Abbie Sons, MD;  Location: [facility];  Service: Urology;  Laterality: Left;   CYSTOSCOPY/URETEROSCOPY/HOLMIUM LASER/STENT PLACEMENT Left 11/15/2021   Procedure: CYSTOSCOPY/URETEROSCOPY/HOLMIUM LASER/STENT PLACEMENT;  Surgeon: Abbie Sons, MD;  Location: [facility];  Service: Urology;  Laterality: Left;   HOLMIUM LASER APPLICATION Left [id number]   Procedure: HOLMIUM LASER APPLICATION;  Surgeon: Irine Seal, MD;  Location: [facility];  Service: Urology;  Laterality: Left;   HOLMIUM LASER APPLICATION Bilateral [id number]   Procedure: HOLMIUM LASER APPLICATION;  Surgeon: Alexis Frock, MD;  Location: [facility];  Service: Urology;  Laterality: Bilateral;  HOLMIUM LASER APPLICATION Right [id number]   Procedure: HOLMIUM LASER APPLICATION;  Surgeon: Alexis Frock, MD;  Location: [facility];  Service: Urology;  Laterality: Right;   kidney stone removal     LITHOTRIPSY     PARATHYROIDECTOMY Right 11/13/2016   Procedure: RIGHT SUPERIOR PARATHYROIDECTOMY;  Surgeon: Armandina Gemma, MD;  Location: [facility];   Service: General;  Laterality: Right;   surgery for,ectopic pregnancy  2011    1 fallopian tube rupture   TUBAL LIGATION  2011    Social History   Socioeconomic History   Marital status: Divorced    Spouse name: Not on file   Number of children: 4   Years of education: Not on file   Highest education level: Some college, no degree  Occupational History   Not on file  Tobacco Use   Smoking status: Never   Smokeless tobacco: Never  Vaping Use   Vaping Use: Never used  Substance and Sexual Activity   Alcohol use: Not Currently   Drug use: Yes    Types: Marijuana   Sexual activity: Yes  Other Topics Concern   Not on file  Social History Narrative   Lives at home with her son, independent at baselinepend   Social Determinants of Health   Financial Resource Strain: Medium Risk (09/11/2018)   Overall Financial Resource Strain (CARDIA)    Difficulty of Paying Living Expenses: Somewhat hard  Food Insecurity: Food Insecurity Present (09/11/2018)   Hunger Vital Sign    Worried About Running Out of Food in the Last Year: Often true    Ran Out of Food in the Last Year: Often true  Transportation Needs: Unmet Transportation Needs (09/11/2018)   PRAPARE - Hydrologist (Medical): Yes    Lack of Transportation (Non-Medical): Yes  Physical Activity: Inactive (09/11/2018)   Exercise Vital Sign    Days of Exercise per Week: 0 days    Minutes of Exercise per Session: 0 min  Stress: Stress Concern Present (09/11/2018)   [place]    Feeling of Stress : Very much  Social Connections: Moderately Isolated (09/11/2018)   Social Connection and Isolation Panel [NHANES]    Frequency of Communication with Friends and Family: More than three times a week    Frequency of Social Gatherings with Friends and Family: More than three times a week    Attends Religious Services: Never    Marine scientist  or Organizations: No    Attends Archivist Meetings: Never    Marital Status: Divorced  Human resources officer Violence: Not At Risk (09/11/2018)   Humiliation, Afraid, Rape, and Kick questionnaire    Fear of Current or Ex-Partner: No    Emotionally Abused: No    Physically Abused: No    Sexually Abused: No    Family History  Problem Relation Age of Onset   Bell's palsy Mother    Heart failure Mother    Hypertension Mother    Lupus Mother    Anxiety disorder Mother    Depression Mother    Stroke Mother    Asthma Father    Hypertension Father    Hyperlipidemia Father    Anxiety disorder Father    Depression Father    Hypertension Maternal Grandmother    Diabetes Maternal Grandmother    Sarcoidosis Maternal Grandmother    Anxiety disorder Sister    Depression Sister    Bipolar disorder Sister  Depression Son    Bipolar disorder Son      Current Outpatient Medications:    albuterol (PROVENTIL HFA;VENTOLIN HFA) 108 (90 Base) MCG/ACT inhaler, Inhale 2 puffs into the lungs every 6 (six) hours as needed for wheezing or shortness of breath., Disp: 1 Inhaler, Rfl: 2   albuterol (PROVENTIL) (2.5 MG/3ML) 0.083% nebulizer solution, Take 3 mLs (2.5 mg total) by nebulization every 6 (six) hours as needed for wheezing or shortness of breath., Disp: 75 mL, Rfl: 12   dasatinib (SPRYCEL) 100 MG tablet, Take 1 tablet (100 mg total) by mouth daily., Disp: 30 tablet, Rfl: 1   EPINEPHrine 0.3 mg/0.3 mL IJ SOAJ injection, Inject into the muscle., Disp: , Rfl:    cetirizine (ZYRTEC) 10 MG tablet, Take by mouth. (Patient not taking: Reported on 03/15/2023), Disp: , Rfl:   No results found.  No images are attached to the encounter.      Latest Ref Rng & Units 02/09/2023    2:45 PM  CMP  Glucose 70 - 99 mg/dL 103   BUN 6 - 20 mg/dL 12   Creatinine 0.44 - 1.00 mg/dL 0.88   Sodium 135 - 145 mmol/L 138   Potassium 3.5 - 5.1 mmol/L 4.3   Chloride 98 - 111 mmol/L 101   CO2 22 - 32 mmol/L  28   Calcium 8.9 - 10.3 mg/dL 9.2   Total Protein 6.5 - 8.1 g/dL 7.5   Total Bilirubin 0.3 - 1.2 mg/dL 0.4   Alkaline Phos 38 - 126 U/L 65   AST 15 - 41 U/L 21   ALT 0 - 44 U/L 18       Latest Ref Rng & Units 02/09/2023    2:45 PM  CBC  WBC 4.0 - 10.5 K/uL 5.9   Hemoglobin 12.0 - 15.0 g/dL 12.1   Hematocrit 36.0 - 46.0 % 39.5   Platelets 150 - 400 K/uL 284      Observation/objective:appears in no acute distress over video visit today.   Assessment and plan:Patient is a 52 year old female with chronic phase CML and this is a visit to discuss further management of CML  Patient has been on sprycel since December 2023. At one year CML monitoring algorithm requires BCR ABL PCR transcripts to be less than 0.1%. closer monitoring and potential change in treatment when between 0.1-1% and change in treatment when >1%  In patients case, BCR ABL PCR has never ben undetectable. It has increased from 0.24% in November 2023 to 1.07% in Feb 2024. This therefore warrants a change in treatment.  I recommend repeat BM biopsy at this time and peripheral blood mutational analysis. She will remain on sprycel for now.  I will see her in 3 weeks to discuss BM Bx and mutational analysis results and decide which drug to switch to. She declines second opinion at [facility] at this time.  Follow-up instructions:  I discussed the assessment and treatment plan with the patient. The patient was provided an opportunity to ask questions and all were answered. The patient agreed with the plan and demonstrated an understanding of the instructions.   The patient was advised to call back or seek an in-person evaluation if the symptoms worsen or if the condition fails to improve as anticipated.  I provided 16 minutes of face-to-face video visit time during this encounter, and > 50% was spent counseling as documented under my assessment & plan.  Visit Diagnosis: 1. CML (chronic myelocytic leukemia) ([place])   2.  High risk  medication use   3. Goals of care, counseling/discussion     Dr. Randa Evens, MD, MPH [facility] Tel- [phone] 03/18/2023 12:05 PM

## 2023-03-19 ENCOUNTER — Telehealth: Payer: Self-pay | Admitting: *Deleted

## 2023-03-19 NOTE — Telephone Encounter (Signed)
Called with the schedule for Bone marrow bx. It was going to be April 9 arrival at 8:30 or there is a cancellation  of another appt is for this week on wed 4/3 arrival 7:30. If she can call and let me know which is best for her. I left my number to call back

## 2023-03-20 ENCOUNTER — Telehealth: Payer: Self-pay | Admitting: *Deleted

## 2023-03-20 NOTE — Telephone Encounter (Signed)
Called the pt. And she answered and she got my message and she wants 4/9 arrival at heart and vascular area. Only sips of water if she has meds to take. NPO for 8 hours and must have a driver with her.  She is agreeable to this. She will be here at 8:30 for a 9:30 bx.

## 2023-03-22 DIAGNOSIS — Z1152 Encounter for screening for COVID-19: Secondary | ICD-10-CM | POA: Diagnosis not present

## 2023-03-25 ENCOUNTER — Telehealth: Payer: Medicaid Other | Admitting: Family

## 2023-03-25 DIAGNOSIS — R21 Rash and other nonspecific skin eruption: Secondary | ICD-10-CM | POA: Diagnosis not present

## 2023-03-25 MED ORDER — TRIAMCINOLONE ACETONIDE 0.5 % EX OINT: 1.0000 | TOPICAL_OINTMENT | Freq: Two times a day (BID) | CUTANEOUS | 0 refills | Status: DC

## 2023-03-25 NOTE — Progress Notes (Signed)
Virtual Visit Consent   Winniefred Capurso, you are scheduled for a virtual visit with a [facility] provider today. Just as with appointments in the office, your consent must be obtained to participate. Your consent will be active for this visit and any virtual visit you may have with one of our providers in the next 365 days. If you have a MyChart account, a copy of this consent can be sent to you electronically.  As this is a virtual visit, video technology does not allow for your provider to perform a traditional examination. This may limit your provider's ability to fully assess your condition. If your provider identifies any concerns that need to be evaluated in person or the need to arrange testing (such as labs, EKG, etc.), we will make arrangements to do so. Although advances in technology are sophisticated, we cannot ensure that it will always work on either your end or our end. If the connection with a video visit is poor, the visit may have to be switched to a telephone visit. With either a video or telephone visit, we are not always able to ensure that we have a secure connection.  By engaging in this virtual visit, you consent to the provision of healthcare and authorize for your insurance to be billed (if applicable) for the services provided during this visit. Depending on your insurance coverage, you may receive a charge related to this service.  I need to obtain your verbal consent now. Are you willing to proceed with your visit today? Carol Ortega has provided verbal consent on 03/25/2023 for a virtual visit (video or telephone). Jannifer Rodney, FNP  Date: 03/25/2023 9:11 AM  Virtual Visit via Video Note   I, Jannifer Rodney, connected with  Carol Ortega  ([id number], [birth year]) on 03/25/23 at  8:45 AM EDT by a video-enabled telemedicine application and verified that I am speaking with the correct person using two identifiers.  Location: Patient: Virtual Visit Location Patient:  Home Provider: Virtual Visit Location Provider: Home Office   I discussed the limitations of evaluation and management by telemedicine and the availability of in person appointments. The patient expressed understanding and agreed to proceed.    History of Present Illness: Carol Ortega is a 52 y.o. who identifies as a female who was assigned female at birth, and is being seen today for rash on left chest and left arm.She reports she has been outside and took her clothes off and noticed spot on her chest and left axilla.   HPI: Rash This is a new problem. The current episode started yesterday. The problem is unchanged. The rash is characterized by redness.    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   Menorrhagia with irregular cycle 10/30/2019   Personal history of benign brain tumor 11/06/2018   Insomnia 07/30/2018   Chronic pain of both knees 07/30/2018   Morbid obesity due to excess calories 05/09/2018   Depression, recurrent 05/09/2018   GAD (generalized anxiety disorder) 05/09/2018   DVT (deep venous thrombosis) 04/15/2018   Bladder spasms 03/12/2018   Ureteral stone with hydronephrosis 02/28/2018   Kidney stone 04/09/2017   H/O pulmonary embolus during pregnancy 08/28/2016   S/P laparoscopic cholecystectomy 08/28/2016   Hyperparathyroidism (HCC) 09/10/2015   Vitamin D deficiency 06/25/2014   Hypercalcemia 02/09/2014  Essential hypertension 01/30/2014   Hyperlipidemia 01/30/2014   Nephrolithiasis, 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 Pt states ok to take if previously taken benadryl or prednisone   Penicillins Hives and Other (See Comments)    Has patient had a PCN reaction causing immediate rash, facial/tongue/throat swelling, SOB or lightheadedness with hypotension: No Has patient had a PCN reaction causing severe rash involving mucus membranes or skin necrosis: No Has patient had a PCN reaction that required hospitalization No Has patient had a PCN reaction occurring within the last 10 years: No If all of the above answers are "NO", then may proceed with Cephalosporin use.   Wellbutrin [Bupropion] Other (See Comments)    irritiability   Latex Rash   Oxycodone-Acetaminophen Itching   Medications:  Current Outpatient Medications:    triamcinolone ointment (KENALOG) 0.5 %, Apply 1 Application topically 2 (two) times daily., Disp: 30 g, Rfl: 0   albuterol (PROVENTIL HFA;VENTOLIN HFA) 108 (90 Base) MCG/ACT inhaler, Inhale 2 puffs into the lungs every 6 (six) hours as needed for wheezing or shortness of breath., Disp: 1 Inhaler, Rfl: 2   albuterol (PROVENTIL) (2.5 MG/3ML) 0.083% nebulizer solution, Take 3 mLs (2.5 mg total) by nebulization every 6 (six) hours as needed for wheezing or shortness of breath., Disp: 75 mL, Rfl: 12   cetirizine (ZYRTEC) 10 MG tablet, Take by mouth. (Patient not taking: Reported on 03/15/2023), Disp: , Rfl:    dasatinib (SPRYCEL) 100 MG tablet, Take 1 tablet (100 mg total) by mouth daily., Disp: 30 tablet, Rfl: 1   EPINEPHrine 0.3 mg/0.3 mL IJ SOAJ injection, Inject into the muscle., Disp: , Rfl:   Observations/Objective: Patient is well-developed, well-nourished in no acute distress.  Resting comfortably  at home.  Head is normocephalic,  atraumatic.  No labored breathing.  Speech is clear and coherent with logical content.  Patient is alert and oriented at baseline.  Quarter size erythemas on left chest and left medial bicep   Assessment and Plan: 1. Rash and nonspecific skin eruption - triamcinolone ointment (KENALOG) 0.5 %; Apply 1 Application topically 2 (two) times daily.  Dispense: 30 g; Refill: 0  Avoid scratching Keep clean and dry If worsens will need to follow up  Follow Up Instructions: I discussed the assessment and treatment plan with the patient. The patient was provided an opportunity to ask questions and all were answered. The patient agreed with the plan and demonstrated an understanding of the instructions.  A copy of instructions were sent to the patient via MyChart unless otherwise noted below.     The patient was advised to call back or seek an in-person evaluation if the symptoms worsen or if the condition fails to improve as anticipated.  Time:  I spent 8 minutes with the patient via telehealth technology discussing the above problems/concerns.    Jannifer Rodney, FNP

## 2023-03-26 ENCOUNTER — Other Ambulatory Visit: Payer: Self-pay

## 2023-03-26 ENCOUNTER — Other Ambulatory Visit: Payer: Self-pay | Admitting: Student

## 2023-03-26 ENCOUNTER — Encounter: Payer: Self-pay | Admitting: Oncology

## 2023-03-26 DIAGNOSIS — C9210 Chronic myeloid leukemia, BCR/ABL-positive, not having achieved remission: Secondary | ICD-10-CM

## 2023-03-26 NOTE — Progress Notes (Signed)
Patient for IR Bone Marrow Biopsy on Tues 03/27/2023, I called and spoke with the patient on the phone and gave pre-procedure instructions. Pt was made aware to be here at 8:30a, NPO after MN prior to procedure as well as driver post procedure/recovery/discharge. Pt stated understanding.  Called 03/26/2023

## 2023-03-27 ENCOUNTER — Encounter: Payer: Self-pay | Admitting: Radiology

## 2023-03-27 ENCOUNTER — Other Ambulatory Visit: Payer: Self-pay | Admitting: *Deleted

## 2023-03-27 ENCOUNTER — Ambulatory Visit
Admission: RE | Admit: 2023-03-27 | Discharge: 2023-03-27 | Disposition: A | Discharge status: Home / Self Care | Payer: Medicaid Other | Source: Ambulatory Visit | Attending: Oncology | Admitting: Oncology

## 2023-03-27 ENCOUNTER — Encounter: Payer: Self-pay | Admitting: Student

## 2023-03-27 ENCOUNTER — Other Ambulatory Visit: Payer: Self-pay

## 2023-03-27 VITALS — BP 137/99 | HR 77 | Temp 97.7°F | Resp 15 | Ht 66.0 in | Wt 319.0 lb

## 2023-03-27 DIAGNOSIS — E669 Obesity, unspecified: Secondary | ICD-10-CM | POA: Diagnosis not present

## 2023-03-27 DIAGNOSIS — C9210 Chronic myeloid leukemia, BCR/ABL-positive, not having achieved remission: Secondary | ICD-10-CM | POA: Insufficient documentation

## 2023-03-27 DIAGNOSIS — Z6841 Body Mass Index (BMI) 40.0 and over, adult: Secondary | ICD-10-CM | POA: Insufficient documentation

## 2023-03-27 DIAGNOSIS — I10 Essential (primary) hypertension: Secondary | ICD-10-CM | POA: Diagnosis not present

## 2023-03-27 DIAGNOSIS — D649 Anemia, unspecified: Secondary | ICD-10-CM | POA: Diagnosis not present

## 2023-03-27 DIAGNOSIS — D471 Chronic myeloproliferative disease: Secondary | ICD-10-CM

## 2023-03-27 DIAGNOSIS — F419 Anxiety disorder, unspecified: Secondary | ICD-10-CM | POA: Insufficient documentation

## 2023-03-27 HISTORY — PX: IR BONE MARROW BIOPSY & ASPIRATION: IMG5727

## 2023-03-27 LAB — CBC
HCT: 41.8 % (ref 36.0–46.0)
Hemoglobin: 12.8 g/dL (ref 12.0–15.0)
MCH: 27.6 pg (ref 26.0–34.0)
MCHC: 30.6 g/dL (ref 30.0–36.0)
MCV: 90.3 fL (ref 80.0–100.0)
Platelets: 309 10*3/uL (ref 150–400)
RBC: 4.63 MIL/uL (ref 3.87–5.11)
RDW: 14.7 % (ref 11.5–15.5)
WBC: 6.3 10*3/uL (ref 4.0–10.5)
nRBC: 0 % (ref 0.0–0.2)

## 2023-03-27 LAB — BASIC METABOLIC PANEL
Anion gap: 6 (ref 5–15)
BUN: 12 mg/dL (ref 6–20)
CO2: 26 mmol/L (ref 22–32)
Calcium: 8.7 mg/dL — ABNORMAL LOW (ref 8.9–10.3)
Chloride: 108 mmol/L (ref 98–111)
Creatinine, Ser: 0.78 mg/dL (ref 0.44–1.00)
GFR, Estimated: >60 mL/min (ref >60)
Glucose, Bld: 107 mg/dL — ABNORMAL HIGH (ref 70–99)
Potassium: 3.9 mmol/L (ref 3.5–5.1)
Sodium: 140 mmol/L (ref 135–145)

## 2023-03-27 MED ORDER — FENTANYL CITRATE (PF) 100 MCG/2ML IJ SOLN
INTRAMUSCULAR | Status: AC | PRN
  Administered 2023-03-27: 25 ug via INTRAVENOUS
  Administered 2023-03-27: 50 ug via INTRAVENOUS
  Administered 2023-03-27: 25 ug via INTRAVENOUS
  Administered 2023-03-27: 50 ug via INTRAVENOUS
  Administered 2023-03-27: 25 ug via INTRAVENOUS

## 2023-03-27 MED ORDER — FENTANYL CITRATE (PF) 100 MCG/2ML IJ SOLN
INTRAMUSCULAR | Status: AC
  Filled 2023-03-27: qty 2

## 2023-03-27 MED ORDER — HEPARIN SODIUM (PORCINE) 1000 UNIT/ML IJ SOLN
INTRAMUSCULAR | Status: AC
  Filled 2023-03-27: qty 10

## 2023-03-27 MED ORDER — LIDOCAINE HCL (PF) 2 % IJ SOLN
INTRAMUSCULAR | Status: AC
  Filled 2023-03-27: qty 10

## 2023-03-27 MED ORDER — MIDAZOLAM HCL 5 MG/5ML IJ SOLN
INTRAMUSCULAR | Status: AC | PRN
  Administered 2023-03-27 (×2): .5 mg via INTRAVENOUS

## 2023-03-27 MED ORDER — MIDAZOLAM HCL 2 MG/2ML IJ SOLN
INTRAMUSCULAR | Status: AC
  Filled 2023-03-27: qty 2

## 2023-03-27 MED ORDER — SODIUM CHLORIDE 0.9 % IV SOLN: INTRAVENOUS | Status: DC

## 2023-03-27 MED ORDER — HEPARIN SOD (PORK) LOCK FLUSH 100 UNIT/ML IV SOLN
INTRAVENOUS | Status: AC
  Filled 2023-03-27: qty 5

## 2023-03-27 MED ORDER — MIDAZOLAM HCL 2 MG/2ML IJ SOLN
INTRAMUSCULAR | Status: AC | PRN
  Administered 2023-03-27 (×2): 1 mg via INTRAVENOUS
  Administered 2023-03-27: .5 mg via INTRAVENOUS

## 2023-03-27 MED ORDER — LIDOCAINE HCL (PF) 1 % IJ SOLN
INTRAMUSCULAR | Status: AC
  Filled 2023-03-27: qty 30

## 2023-03-27 NOTE — H&P (Signed)
Chief Complaint: Patient was seen in consultation today for chronic myelocytic leukemia  Referring Physician(s): Rao,Archana C  Supervising Physician: Ruel Favors  Patient Status: [facility] - Out-pt  History of Present Illness: Carol Ortega is a 52 y.o. female with PMH significant for anemia, anxiety, brain tumor s/p radiation therapy, CML, hypertension, and obesity being seen today in relation to her CML. Patient is known to IR service from CT bone marrow biopsy performed 01/25/22 by Dr Juliette Alcide. Patient is followed by Dr Smith Robert from Oncology who has requested an image-guided bone marrow biopsy to evaluate the patient's CML.  Past Medical History:  Diagnosis Date   Anemia    Anxiety    Asthma    seasonal   Benign brain tumor    Brain tumor 2014   tx with radiation   CML (chronic myelocytic leukemia) 11/21/2021   Complication of anesthesia 1999   lung collapse after surgery for gallbladder   Depression    Ectopic pregnancy    Headache    migraines   Hoarseness of voice    Hypertension    Kidney stone    Obesity    Parathyroid abnormality    Pulmonary emboli 2011   Pulmonary embolism    Radiation    Brain tumor   UTI (urinary tract infection)    Vitamin D deficiency     Past Surgical History:  Procedure Laterality Date   CHOLECYSTECTOMY  1999   CYSTOSCOPY W/ RETROGRADES Left 03/01/2018   Procedure: CYSTOSCOPY WITH RETROGRADE PYELOGRAM;  Surgeon: Riki Altes, MD;  Location: [facility];  Service: Urology;  Laterality: Left;   CYSTOSCOPY W/ URETERAL STENT PLACEMENT Right 04/08/2017   Procedure: CYSTOSCOPY WITH RETROGRADE PYELOGRAM/URETERAL STENT PLACEMENT;  Surgeon: Barron Alvine, MD;  Location: [facility];  Service: Urology;  Laterality: Right;   CYSTOSCOPY W/ URETERAL STENT PLACEMENT Left 03/12/2018   Procedure: CYSTOSCOPY WITH STENT REPLACEMENT;  Surgeon: Riki Altes, MD;  Location: [facility];  Service: Urology;  Laterality: Left;   CYSTOSCOPY W/ URETEROSCOPY      CYSTOSCOPY WITH RETROGRADE PYELOGRAM, URETEROSCOPY AND STENT PLACEMENT Left 01/20/2014   Procedure: LEFT URETEROSCOPY WITH STENT PLACEMENT;  Surgeon: Bjorn Pippin, MD;  Location: [facility];  Service: Urology;  Laterality: Left;   CYSTOSCOPY WITH RETROGRADE PYELOGRAM, URETEROSCOPY AND STENT PLACEMENT Bilateral 04/23/2015   Procedure: CYSTOSCOPY WITH BILATERAL RETROGRADE PYELOGRAM, URETEROSCOPY AND STENT PLACEMENT;  Surgeon: Sebastian Ache, MD;  Location: [facility];  Service: Urology;  Laterality: Bilateral;   CYSTOSCOPY WITH RETROGRADE PYELOGRAM, URETEROSCOPY AND STENT PLACEMENT Right 04/13/2017   Procedure: CYSTOSCOPY WITH RETROGRADE PYELOGRAM, URETEROSCOPY AND STENT EXCHANGE;  Surgeon: Sebastian Ache, MD;  Location: [facility];  Service: Urology;  Laterality: Right;   CYSTOSCOPY WITH STENT PLACEMENT Left 01/12/2014   Procedure: CYSTOSCOPY WITH STENT PLACEMENT left retrograde;  Surgeon: Bjorn Pippin, MD;  Location: [facility];  Service: Urology;  Laterality: Left;   CYSTOSCOPY WITH STENT PLACEMENT Left 03/01/2018   Procedure: CYSTOSCOPY WITH STENT PLACEMENT;  Surgeon: Riki Altes, MD;  Location: [facility];  Service: Urology;  Laterality: Left;   CYSTOSCOPY/URETEROSCOPY/HOLMIUM LASER/STENT PLACEMENT Left 03/12/2018   Procedure: CYSTOSCOPY/URETEROSCOPY/HOLMIUM LASER;  Surgeon: Riki Altes, MD;  Location: [facility];  Service: Urology;  Laterality: Left;   CYSTOSCOPY/URETEROSCOPY/HOLMIUM LASER/STENT PLACEMENT Left 11/15/2021   Procedure: CYSTOSCOPY/URETEROSCOPY/HOLMIUM LASER/STENT PLACEMENT;  Surgeon: Riki Altes, MD;  Location: [facility];  Service: Urology;  Laterality: Left;   HOLMIUM LASER APPLICATION Left 01/20/2014   Procedure: HOLMIUM LASER APPLICATION;  Surgeon: Bjorn Pippin, MD;  Location: [facility];  Service: Urology;  Laterality: Left;   HOLMIUM LASER APPLICATION Bilateral 04/23/2015   Procedure: HOLMIUM LASER APPLICATION;  Surgeon: Sebastian Acheheodore Manny, MD;  Location: [facility];  Service: Urology;  Laterality: Bilateral;    HOLMIUM LASER APPLICATION Right 04/13/2017   Procedure: HOLMIUM LASER APPLICATION;  Surgeon: Sebastian Acheheodore Manny, MD;  Location: [facility];  Service: Urology;  Laterality: Right;   kidney stone removal     LITHOTRIPSY     PARATHYROIDECTOMY Right 11/13/2016   Procedure: RIGHT SUPERIOR PARATHYROIDECTOMY;  Surgeon: Darnell Levelodd Gerkin, MD;  [facility];  Service: General;  Laterality: Right;   surgery for,ectopic pregnancy  2011    1 fallopian tube rupture   TUBAL LIGATION  2011    Allergies: Bee venom, Contrast media [iodinated contrast media], Egg-derived products, Other, Penicillins, Wellbutrin [bupropion], Latex, and Oxycodone-acetaminophen  Medications: Prior to Admission medications   Medication Sig Start Date End Date Taking? Authorizing Provider  albuterol (PROVENTIL HFA;VENTOLIN HFA) 108 (90 Base) MCG/ACT inhaler Inhale 2 puffs into the lungs every 6 (six) hours as needed for wheezing or shortness of breath. 03/04/18   Alford HighlandWieting, Richard, MD  albuterol (PROVENTIL) (2.5 MG/3ML) 0.083% nebulizer solution Take 3 mLs (2.5 mg total) by nebulization every 6 (six) hours as needed for wheezing or shortness of breath. 03/04/18   Alford HighlandWieting, Richard, MD  cetirizine (ZYRTEC) 10 MG tablet Take by mouth. Patient not taking: Reported on 03/15/2023 04/21/22   [provider]  dasatinib (SPRYCEL) 100 MG tablet Take 1 tablet (100 mg total) by mouth daily. 01/26/23   Creig Hinesao, Archana C, MD  EPINEPHrine 0.3 mg/0.3 mL IJ SOAJ injection Inject into the muscle. 10/13/19   [provider]  triamcinolone ointment (KENALOG) 0.5 % Apply 1 Application topically 2 (two) times daily. 03/25/23   Junie SpencerHawks, Christy A, FNP     Family History  Problem Relation Age of Onset   Bell's palsy Mother    Heart failure Mother    Hypertension Mother    Lupus Mother    Anxiety disorder Mother    Depression Mother    Stroke Mother    Asthma Father    Hypertension Father    Hyperlipidemia Father    Anxiety disorder Father     Depression Father    Hypertension Maternal Grandmother    Diabetes Maternal Grandmother    Sarcoidosis Maternal Grandmother    Anxiety disorder Sister    Depression Sister    Bipolar disorder Sister    Depression Son    Bipolar disorder Son     Social History   Socioeconomic History   Marital status: Divorced    Spouse name: Not on file   Number of children: 4   Years of education: Not on file   Highest education level: Some college, no degree  Occupational History   Not on file  Tobacco Use   Smoking status: Never   Smokeless tobacco: Never  Vaping Use   Vaping Use: Never used  Substance and Sexual Activity   Alcohol use: Not Currently   Drug use: Yes    Types: Marijuana   Sexual activity: Yes  Other Topics Concern   Not on file  Social History Narrative   Lives at home with her son, independent at baselinepend   Social Determinants of Health   Financial Resource Strain: Medium Risk (09/11/2018)   Overall Financial Resource Strain (CARDIA)    Difficulty of Paying Living Expenses: Somewhat hard  Food Insecurity: Food Insecurity Present (09/11/2018)   Hunger  Vital Sign    Worried About Programme researcher, broadcasting/film/video in the Last Year: Often true    Ran Out of Food in the Last Year: Often true  Transportation Needs: Unmet Transportation Needs (09/11/2018)   PRAPARE - Administrator, Civil Service (Medical): Yes    Lack of Transportation (Non-Medical): Yes  Physical Activity: Inactive (09/11/2018)   Exercise Vital Sign    Days of Exercise per Week: 0 days    Minutes of Exercise per Session: 0 min  Stress: Stress Concern Present (09/11/2018)   [employer] of Occupational Health - Occupational Stress Questionnaire    Feeling of Stress : Very much  Social Connections: Moderately Isolated (09/11/2018)   Social Connection and Isolation Panel [NHANES]    Frequency of Communication with Friends and Family: More than three times a week    Frequency of Social Gatherings  with Friends and Family: More than three times a week    Attends Religious Services: Never    Database administrator or Organizations: No    Attends Engineer, structural: Never    Marital Status: Divorced    Code Status: Full Code  Review of Systems: A 12 point ROS discussed and pertinent positives are indicated in the HPI above.  All other systems are negative.  Review of Systems  Constitutional:  Negative for chills and fever.  Respiratory:  Negative for chest tightness and shortness of breath.   Cardiovascular:  Negative for chest pain and leg swelling.  Gastrointestinal:  Positive for diarrhea. Negative for abdominal pain, nausea and vomiting.  Neurological:  Negative for dizziness and headaches.  Psychiatric/Behavioral:  Negative for confusion.     Vital Signs: BP (!) 138/93   Pulse 89   Temp 97.7 F (36.5 C) (Oral)   Resp (!) 21   Ht 5\' 6"  (1.676 m)   Wt (!) 319 lb (144.7 kg)   LMP 06/18/2019   SpO2 97%   BMI 51.49 kg/m     Physical Exam Vitals reviewed.  Constitutional:      General: She is not in acute distress.    Appearance: She is obese. She is not ill-appearing.  Cardiovascular:     Rate and Rhythm: Normal rate and regular rhythm.     Pulses: Normal pulses.     Heart sounds: Normal heart sounds.  Pulmonary:     Effort: Pulmonary effort is normal.     Breath sounds: Normal breath sounds.  Abdominal:     Palpations: Abdomen is soft.     Tenderness: There is no abdominal tenderness.  Musculoskeletal:     Right lower leg: No edema.     Left lower leg: No edema.  Skin:    General: Skin is warm and dry.  Neurological:     Mental Status: She is alert and oriented to person, place, and time.  Psychiatric:        Mood and Affect: Mood normal.        Behavior: Behavior normal.     Imaging: No results found.  Labs:  CBC: Recent Labs    06/28/22 1038 08/11/22 0932 11/03/22 1254 02/09/23 1445  WBC 6.7 6.3 5.8 5.9  HGB 12.0 12.0 13.1  12.1  HCT 39.4 37.9 42.5 39.5  PLT 253 258 251 284    COAGS: No results for input(s): "INR", "APTT" in the last 8760 hours.  BMP: Recent Labs    06/28/22 1038 08/11/22 0932 11/03/22 1254 02/09/23 1445  NA 138 139  139 138  K 4.0 3.6 4.2 4.3  CL 104 105 101 101  CO2 28 28 29 28   GLUCOSE 103* 116* 116* 103*  BUN 13 14 11 12   CALCIUM 8.6* 8.9 9.3 9.2  CREATININE 0.82 0.77 0.71 0.88  GFRNONAA >60 >60 >60 >60    LIVER FUNCTION TESTS: Recent Labs    06/28/22 1038 08/11/22 0932 11/03/22 1254 02/09/23 1445  BILITOT 0.3 0.5 0.5 0.4  AST 18 21 21 21   ALT 15 15 19 18   ALKPHOS 70 66 69 65  PROT 7.0 7.5 8.4* 7.5  ALBUMIN 3.4* 3.8 4.0 3.6    TUMOR MARKERS: No results for input(s): "AFPTM", "CEA", "CA199", "CHROMGRNA" in the last 8760 hours.  Assessment and Plan:  Carol Ortega is a 52 yo female being seen today for image-guided bone marrow biopsy secondary to her CML. Patient presents today in her usual state of health, though she states she is anxious about the procedure. She is NPO. Case has been reviewed by Dr Miles Costain and is set to proceed on 03/27/23.   Risks and benefits of image-guided bone marrow biopsy was discussed with the patient and/or patient's family including, but not limited to bleeding, infection, damage to adjacent structures or low yield requiring additional tests.  All of the questions were answered and there is agreement to proceed.  Consent signed and in chart.   Thank you for this interesting consult.  I greatly enjoyed meeting Carol Ortega and look forward to participating in their care.  A copy of this report was sent to the requesting provider on this date.  Electronically Signed: Kennieth Francois, PA-C 03/27/2023, 9:17 AM   I spent a total of  25 Minutes in face to face in clinical consultation, greater than 50% of which was counseling/coordinating care for chronic myelocytic leukemia.

## 2023-03-27 NOTE — Procedures (Signed)
Interventional Radiology Procedure Note  Procedure: IR RT ILIAC BM ASP AND CORE BX    Complications: None  Estimated Blood Loss:  MIN  Findings: 11 G CORE AND ASP    M. Ruel Favors, MD

## 2023-03-28 ENCOUNTER — Other Ambulatory Visit: Payer: Self-pay

## 2023-03-29 DIAGNOSIS — Z1159 Encounter for screening for other viral diseases: Secondary | ICD-10-CM | POA: Diagnosis not present

## 2023-03-29 LAB — SURGICAL PATHOLOGY

## 2023-03-30 ENCOUNTER — Other Ambulatory Visit: Payer: Self-pay

## 2023-04-03 ENCOUNTER — Encounter (HOSPITAL_COMMUNITY): Payer: Self-pay | Admitting: Oncology

## 2023-04-03 LAB — BCR-ABL1, CML/ALL, PCR, QUANT: b2a2 transcript: 1.1297 %

## 2023-04-04 ENCOUNTER — Other Ambulatory Visit: Payer: Self-pay | Admitting: Pharmacist

## 2023-04-04 ENCOUNTER — Other Ambulatory Visit: Payer: Self-pay

## 2023-04-04 ENCOUNTER — Other Ambulatory Visit (HOSPITAL_COMMUNITY): Payer: Self-pay

## 2023-04-04 DIAGNOSIS — C9210 Chronic myeloid leukemia, BCR/ABL-positive, not having achieved remission: Secondary | ICD-10-CM

## 2023-04-04 MED ORDER — DASATINIB 100 MG PO TABS
100.0000 mg | ORAL_TABLET | Freq: Every day | ORAL | 0 refills | Status: DC
Start: 2023-04-04 — End: 2023-04-24
  Filled 2023-04-04: qty 30, 30d supply, fill #0

## 2023-04-05 ENCOUNTER — Encounter (HOSPITAL_COMMUNITY): Payer: Self-pay | Admitting: Oncology

## 2023-04-06 ENCOUNTER — Inpatient Hospital Stay: Payer: Medicaid Other

## 2023-04-06 ENCOUNTER — Inpatient Hospital Stay: Payer: Medicaid Other | Admitting: Oncology

## 2023-04-09 DIAGNOSIS — Z1159 Encounter for screening for other viral diseases: Secondary | ICD-10-CM | POA: Diagnosis not present

## 2023-04-10 DIAGNOSIS — F4312 Post-traumatic stress disorder, chronic: Secondary | ICD-10-CM | POA: Diagnosis not present

## 2023-04-11 LAB — BCR-ABL1 KINASE DOMAIN MUTATION ANALYSIS

## 2023-04-13 ENCOUNTER — Encounter: Payer: Self-pay | Admitting: Oncology

## 2023-04-13 ENCOUNTER — Inpatient Hospital Stay: Payer: Medicaid Other | Attending: Oncology

## 2023-04-13 ENCOUNTER — Inpatient Hospital Stay (HOSPITAL_BASED_OUTPATIENT_CLINIC_OR_DEPARTMENT_OTHER): Payer: Medicaid Other | Admitting: Oncology

## 2023-04-13 VITALS — BP 130/94 | HR 79 | Temp 98.0°F | Resp 18 | Ht 66.0 in | Wt 313.2 lb

## 2023-04-13 DIAGNOSIS — Z5986 Financial insecurity: Secondary | ICD-10-CM | POA: Insufficient documentation

## 2023-04-13 DIAGNOSIS — Z818 Family history of other mental and behavioral disorders: Secondary | ICD-10-CM | POA: Diagnosis not present

## 2023-04-13 DIAGNOSIS — Z888 Allergy status to other drugs, medicaments and biological substances status: Secondary | ICD-10-CM | POA: Diagnosis not present

## 2023-04-13 DIAGNOSIS — Z8349 Family history of other endocrine, nutritional and metabolic diseases: Secondary | ICD-10-CM | POA: Diagnosis not present

## 2023-04-13 DIAGNOSIS — Z833 Family history of diabetes mellitus: Secondary | ICD-10-CM | POA: Insufficient documentation

## 2023-04-13 DIAGNOSIS — Z87440 Personal history of urinary (tract) infections: Secondary | ICD-10-CM | POA: Diagnosis not present

## 2023-04-13 DIAGNOSIS — Z820 Family history of epilepsy and other diseases of the nervous system: Secondary | ICD-10-CM | POA: Diagnosis not present

## 2023-04-13 DIAGNOSIS — Z7189 Other specified counseling: Secondary | ICD-10-CM

## 2023-04-13 DIAGNOSIS — Z823 Family history of stroke: Secondary | ICD-10-CM | POA: Insufficient documentation

## 2023-04-13 DIAGNOSIS — Z86711 Personal history of pulmonary embolism: Secondary | ICD-10-CM | POA: Insufficient documentation

## 2023-04-13 DIAGNOSIS — C9210 Chronic myeloid leukemia, BCR/ABL-positive, not having achieved remission: Secondary | ICD-10-CM

## 2023-04-13 DIAGNOSIS — Z8759 Personal history of other complications of pregnancy, childbirth and the puerperium: Secondary | ICD-10-CM | POA: Insufficient documentation

## 2023-04-13 DIAGNOSIS — Z8249 Family history of ischemic heart disease and other diseases of the circulatory system: Secondary | ICD-10-CM | POA: Diagnosis not present

## 2023-04-13 DIAGNOSIS — Z91030 Bee allergy status: Secondary | ICD-10-CM | POA: Insufficient documentation

## 2023-04-13 DIAGNOSIS — Z9049 Acquired absence of other specified parts of digestive tract: Secondary | ICD-10-CM | POA: Diagnosis not present

## 2023-04-13 DIAGNOSIS — Z79899 Other long term (current) drug therapy: Secondary | ICD-10-CM | POA: Insufficient documentation

## 2023-04-13 DIAGNOSIS — Z8269 Family history of other diseases of the musculoskeletal system and connective tissue: Secondary | ICD-10-CM | POA: Insufficient documentation

## 2023-04-13 DIAGNOSIS — Z880 Allergy status to penicillin: Secondary | ICD-10-CM | POA: Insufficient documentation

## 2023-04-13 DIAGNOSIS — Z825 Family history of asthma and other chronic lower respiratory diseases: Secondary | ICD-10-CM | POA: Insufficient documentation

## 2023-04-13 DIAGNOSIS — F1290 Cannabis use, unspecified, uncomplicated: Secondary | ICD-10-CM | POA: Insufficient documentation

## 2023-04-13 DIAGNOSIS — Z5941 Food insecurity: Secondary | ICD-10-CM | POA: Insufficient documentation

## 2023-04-13 DIAGNOSIS — Z91041 Radiographic dye allergy status: Secondary | ICD-10-CM | POA: Insufficient documentation

## 2023-04-13 LAB — CBC WITH DIFFERENTIAL/PLATELET
Abs Immature Granulocytes: 0.02 10*3/uL (ref 0.00–0.07)
Basophils Absolute: 0.1 10*3/uL (ref 0.0–0.1)
Basophils Relative: 1 %
Eosinophils Absolute: 0.2 10*3/uL (ref 0.0–0.5)
Eosinophils Relative: 2 %
HCT: 42.5 % (ref 36.0–46.0)
Hemoglobin: 13.2 g/dL (ref 12.0–15.0)
Immature Granulocytes: 0 %
Lymphocytes Relative: 52 %
Lymphs Abs: 3.7 10*3/uL (ref 0.7–4.0)
MCH: 27.7 pg (ref 26.0–34.0)
MCHC: 31.1 g/dL (ref 30.0–36.0)
MCV: 89.1 fL (ref 80.0–100.0)
Monocytes Absolute: 0.7 10*3/uL (ref 0.1–1.0)
Monocytes Relative: 10 %
Neutro Abs: 2.5 10*3/uL (ref 1.7–7.7)
Neutrophils Relative %: 35 %
Platelets: 318 10*3/uL (ref 150–400)
RBC: 4.77 MIL/uL (ref 3.87–5.11)
RDW: 14.9 % (ref 11.5–15.5)
WBC: 7.2 10*3/uL (ref 4.0–10.5)
nRBC: 0 % (ref 0.0–0.2)

## 2023-04-13 LAB — BASIC METABOLIC PANEL
Anion gap: 7 (ref 5–15)
BUN: 11 mg/dL (ref 6–20)
CO2: 29 mmol/L (ref 22–32)
Calcium: 8.9 mg/dL (ref 8.9–10.3)
Chloride: 102 mmol/L (ref 98–111)
Creatinine, Ser: 0.83 mg/dL (ref 0.44–1.00)
GFR, Estimated: >60 mL/min (ref >60)
Glucose, Bld: 115 mg/dL — ABNORMAL HIGH (ref 70–99)
Potassium: 3.6 mmol/L (ref 3.5–5.1)
Sodium: 138 mmol/L (ref 135–145)

## 2023-04-13 NOTE — Progress Notes (Signed)
Hematology/Oncology Consult note [facility]  Telephone:([phone] [phone]  Patient Care Team: Iona Hansen, NP as PCP - General (Nurse Practitioner) Creig Hines, MD as Consulting Physician (Oncology)   Name of the patient: Carol Ortega  [id number]  1971-05-11   Date of visit: 04/13/23  Diagnosis-chronic phase CML  Chief complaint/ Reason for visit-discussed bone marrow biopsy results and further management  Heme/Onc history:  Patient is a 52 year old African-American femaleWho presents to the ER with symptoms of left-sided flank pain.  CT abdomen and pelvis without contrast showed moderate left hydronephrosis secondary to a 1.1 x 0.9 x 1.3 cm calculus at the left ureteropelvic junction/proximal left ureter.  No other evidence of adenopathy and spleen appeared normal.  Incidentally patient was noted to have a white cell count of 138 which on repeat check was 133.  Platelet counts normal and hemoglobin was 12.1.  Last normal CBC was in April 2021 when white count was 7.2.  Presently differential mainly shows neutrophilia along with lymphocytosis monocytosis basophilia and eosinophilia.  Immature granulocytes were seen.  Pathology smear review shows findings concerning for myeloproliferative neoplasm.  Blasts possibly less than 20% and BCR ABL testing was recommended.Patient noted to have wbc of 18 in may 2022 again with predominant left shift.   BCR ABL FISH testing showed 100% nuclei positive for BCR-ABL gene fusion signals.  BCR ABL PCR testing showed B2 A2 transcript 847.313 and B3 A2 transcript less than 0.0032%.  E1 A2 transcript 0.5645%.  Peripheral blood flow cytometry showedAberrant myeloblast population about 2% of the leukocytes.  Absolute neutrophilia eosinophilia and basophilia.  Patient started Dasatinib in early December 2022. Bone marrow biopsy on 01/25/2021 showed hypercellular bone marrow with pan myeloid proliferation.  No increased  number of blasts seen.    Interval history-tolerating Sprycel well without any significant side effects.  Denies any specific concerns today.  ECOG PS- 1 Pain scale- 0 Opioid associated constipation- no  Review of systems- Review of Systems  Constitutional:  Negative for chills, fever, malaise/fatigue and weight loss.  HENT:  Negative for congestion, ear discharge and nosebleeds.   Eyes:  Negative for blurred vision.  Respiratory:  Negative for cough, hemoptysis, sputum production, shortness of breath and wheezing.   Cardiovascular:  Negative for chest pain, palpitations, orthopnea and claudication.  Gastrointestinal:  Negative for abdominal pain, blood in stool, constipation, diarrhea, heartburn, melena, nausea and vomiting.  Genitourinary:  Negative for dysuria, flank pain, frequency, hematuria and urgency.  Musculoskeletal:  Negative for back pain, joint pain and myalgias.  Skin:  Negative for rash.  Neurological:  Negative for dizziness, tingling, focal weakness, seizures, weakness and headaches.  Endo/Heme/Allergies:  Does not bruise/bleed easily.  Psychiatric/Behavioral:  Negative for depression and suicidal ideas. The patient does not have insomnia.       Allergies  Allergen Reactions   Bee Venom Anaphylaxis   Contrast Media [Iodinated Contrast Media] Anaphylaxis   Egg-Derived Products Anaphylaxis   Other Anaphylaxis    Surgical dye.. Pt states ok to take if previously taken benadryl or prednisone   Penicillins Hives and Other (See Comments)    Has patient had a PCN reaction causing immediate rash, facial/tongue/throat swelling, SOB or lightheadedness with hypotension: No Has patient had a PCN reaction causing severe rash involving mucus membranes or skin necrosis: No Has patient had a PCN reaction that required hospitalization No Has patient had a PCN reaction occurring within the last 10 years: No If all of the  above answers are "NO", then may proceed with Cephalosporin  use.   Wellbutrin [Bupropion] Other (See Comments)    irritiability   Latex Rash   Oxycodone-Acetaminophen Itching     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   Complication of anesthesia 1999   lung collapse after surgery for gallbladder   Depression    Ectopic pregnancy    Headache    migraines   Hoarseness of voice    Hypertension    Kidney stone    Obesity    Parathyroid abnormality (HCC)    Pulmonary emboli (HCC) 2011   Pulmonary embolism (HCC)    Radiation    Brain tumor   UTI (urinary tract infection)    Vitamin D deficiency      Past Surgical History:  Procedure Laterality Date   CHOLECYSTECTOMY  1999   CYSTOSCOPY W/ RETROGRADES Left 03/01/2018   Procedure: CYSTOSCOPY WITH RETROGRADE PYELOGRAM;  Surgeon: Riki Altes, MD;  Location: [facility];  Service: Urology;  Laterality: Left;   CYSTOSCOPY W/ URETERAL STENT PLACEMENT Right 04/08/2017   Procedure: CYSTOSCOPY WITH RETROGRADE PYELOGRAM/URETERAL STENT PLACEMENT;  Surgeon: Barron Alvine, MD;  Location: [facility];  Service: Urology;  Laterality: Right;   CYSTOSCOPY W/ URETERAL STENT PLACEMENT Left 03/12/2018   Procedure: CYSTOSCOPY WITH STENT REPLACEMENT;  Surgeon: Riki Altes, MD;  Location: [facility];  Service: Urology;  Laterality: Left;   CYSTOSCOPY W/ URETEROSCOPY     CYSTOSCOPY WITH RETROGRADE PYELOGRAM, URETEROSCOPY AND STENT PLACEMENT Left 01/20/2014   Procedure: LEFT URETEROSCOPY WITH STENT PLACEMENT;  Surgeon: Bjorn Pippin, MD;  Location: [facility];  Service: Urology;  Laterality: Left;   CYSTOSCOPY WITH RETROGRADE PYELOGRAM, URETEROSCOPY AND STENT PLACEMENT Bilateral 04/23/2015   Procedure: CYSTOSCOPY WITH BILATERAL RETROGRADE PYELOGRAM, URETEROSCOPY AND STENT PLACEMENT;  Surgeon: Sebastian Ache, MD;  Location: [facility];  Service: Urology;   Laterality: Bilateral;   CYSTOSCOPY WITH RETROGRADE PYELOGRAM, URETEROSCOPY AND STENT PLACEMENT Right 04/13/2017   Procedure: CYSTOSCOPY WITH RETROGRADE PYELOGRAM, URETEROSCOPY AND STENT EXCHANGE;  Surgeon: Sebastian Ache, MD;  Location: [facility];  Service: Urology;  Laterality: Right;   CYSTOSCOPY WITH STENT PLACEMENT Left 01/12/2014   Procedure: CYSTOSCOPY WITH STENT PLACEMENT left retrograde;  Surgeon: Bjorn Pippin, MD;  Location: [facility];  Service: Urology;  Laterality: Left;   CYSTOSCOPY WITH STENT PLACEMENT Left 03/01/2018   Procedure: CYSTOSCOPY WITH STENT PLACEMENT;  Surgeon: Riki Altes, MD;  Location: [facility];  Service: Urology;  Laterality: Left;   CYSTOSCOPY/URETEROSCOPY/HOLMIUM LASER/STENT PLACEMENT Left 03/12/2018   Procedure: CYSTOSCOPY/URETEROSCOPY/HOLMIUM LASER;  Surgeon: Riki Altes, MD;  Location: [facility];  Service: Urology;  Laterality: Left;   CYSTOSCOPY/URETEROSCOPY/HOLMIUM LASER/STENT PLACEMENT Left 11/15/2021   Procedure: CYSTOSCOPY/URETEROSCOPY/HOLMIUM LASER/STENT PLACEMENT;  Surgeon: Riki Altes, MD;  Location: [facility];  Service: Urology;  Laterality: Left;   HOLMIUM LASER APPLICATION Left 01/20/2014   Procedure: HOLMIUM LASER APPLICATION;  Surgeon: Bjorn Pippin, MD;  Location: [facility];  Service: Urology;  Laterality: Left;   HOLMIUM LASER APPLICATION Bilateral 04/23/2015   Procedure: HOLMIUM LASER APPLICATION;  Surgeon: Sebastian Ache, MD;  Location: [facility];  Service: Urology;  Laterality: Bilateral;   HOLMIUM LASER APPLICATION Right 04/13/2017   Procedure: HOLMIUM LASER APPLICATION;  Surgeon: Sebastian Ache, MD;  Location: [facility];  Service:  Urology;  Laterality: Right;   IR BONE MARROW BIOPSY & ASPIRATION  03/27/2023   kidney stone removal     LITHOTRIPSY     PARATHYROIDECTOMY Right 11/13/2016   Procedure: RIGHT SUPERIOR PARATHYROIDECTOMY;  Surgeon: Darnell Level, MD;  Location: [facility];  Service: General;  Laterality: Right;   surgery for,ectopic pregnancy  2011    1  fallopian tube rupture   TUBAL LIGATION  2011    Social History   Socioeconomic History   Marital status: Divorced    Spouse name: Not on file   Number of children: 4   Years of education: Not on file   Highest education level: Some college, no degree  Occupational History   Not on file  Tobacco Use   Smoking status: Never   Smokeless tobacco: Never  Vaping Use   Vaping Use: Never used  Substance and Sexual Activity   Alcohol use: Not Currently   Drug use: Yes    Types: Marijuana   Sexual activity: Yes  Other Topics Concern   Not on file  Social History Narrative   Lives at home with her son, independent at baselinepend   Social Determinants of Health   Financial Resource Strain: Medium Risk (09/11/2018)   Overall Financial Resource Strain (CARDIA)    Difficulty of Paying Living Expenses: Somewhat hard  Food Insecurity: Food Insecurity Present (09/11/2018)   Hunger Vital Sign    Worried About Running Out of Food in the Last Year: Often true    Ran Out of Food in the Last Year: Often true  Transportation Needs: Unmet Transportation Needs (09/11/2018)   PRAPARE - Administrator, Civil Service (Medical): Yes    Lack of Transportation (Non-Medical): Yes  Physical Activity: Inactive (09/11/2018)   Exercise Vital Sign    Days of Exercise per Week: 0 days    Minutes of Exercise per Session: 0 min  Stress: Stress Concern Present (09/11/2018)   [employer] of Occupational Health - Occupational Stress Questionnaire    Feeling of Stress : Very much  Social Connections: Moderately Isolated (09/11/2018)   Social Connection and Isolation Panel [NHANES]    Frequency of Communication with Friends and Family: More than three times a week    Frequency of Social Gatherings with Friends and Family: More than three times a week    Attends Religious Services: Never    Database administrator or Organizations: No    Attends Banker Meetings: Never    Marital  Status: Divorced  Catering manager Violence: Not At Risk (09/11/2018)   Humiliation, Afraid, Rape, and Kick questionnaire    Fear of Current or Ex-Partner: No    Emotionally Abused: No    Physically Abused: No    Sexually Abused: No    Family History  Problem Relation Age of Onset   Bell's palsy Mother    Heart failure Mother    Hypertension Mother    Lupus Mother    Anxiety disorder Mother    Depression Mother    Stroke Mother    Asthma Father    Hypertension Father    Hyperlipidemia Father    Anxiety disorder Father    Depression Father    Hypertension Maternal Grandmother    Diabetes Maternal Grandmother    Sarcoidosis Maternal Grandmother    Anxiety disorder Sister    Depression Sister    Bipolar disorder Sister    Depression Son    Bipolar disorder Son  Current Outpatient Medications:    albuterol (PROVENTIL) (2.5 MG/3ML) 0.083% nebulizer solution, Take 3 mLs (2.5 mg total) by nebulization every 6 (six) hours as needed for wheezing or shortness of breath., Disp: 75 mL, Rfl: 12   dasatinib (SPRYCEL) 100 MG tablet, Take 1 tablet (100 mg total) by mouth daily., Disp: 30 tablet, Rfl: 0   EPINEPHrine 0.3 mg/0.3 mL IJ SOAJ injection, Inject into the muscle., Disp: , Rfl:    triamcinolone ointment (KENALOG) 0.5 %, Apply 1 Application topically 2 (two) times daily., Disp: 30 g, Rfl: 0  Physical exam:  Vitals:   04/13/23 1049  BP: (!) 130/94  Pulse: 79  Resp: 18  Temp: 98 F (36.7 C)  TempSrc: Tympanic  SpO2: 99%  Weight: (!) 313 lb 3.2 oz (142.1 kg)  Height: 5\' 6"  (1.676 m)   Physical Exam Constitutional:      General: She is not in acute distress. Cardiovascular:     Rate and Rhythm: Normal rate and regular rhythm.     Heart sounds: Normal heart sounds.  Pulmonary:     Effort: Pulmonary effort is normal.     Breath sounds: Normal breath sounds.  Abdominal:     General: Bowel sounds are normal.     Palpations: Abdomen is soft.  Skin:    General: Skin  is warm and dry.  Neurological:     Mental Status: She is alert and oriented to person, place, and time.         Latest Ref Rng & Units 04/13/2023   10:38 AM  CMP  Glucose 70 - 99 mg/dL 161   BUN 6 - 20 mg/dL 11   Creatinine 0.96 - 1.00 mg/dL 0.45   Sodium 409 - 811 mmol/L 138   Potassium 3.5 - 5.1 mmol/L 3.6   Chloride 98 - 111 mmol/L 102   CO2 22 - 32 mmol/L 29   Calcium 8.9 - 10.3 mg/dL 8.9       Latest Ref Rng & Units 04/13/2023   10:38 AM  CBC  WBC 4.0 - 10.5 K/uL 7.2   Hemoglobin 12.0 - 15.0 g/dL 91.4   Hematocrit 78.2 - 46.0 % 42.5   Platelets 150 - 400 K/uL 318     No images are attached to the encounter.  IR BONE MARROW BIOPSY & ASPIRATION  Result Date: 03/27/2023 INDICATION: CML, restaging EXAM: FLUORO GUIDED RIGHT ILIAC BONE MARROW ASPIRATION AND CORE BIOPSY Date:  03/27/2023 03/27/2023 11:43 am Radiologist:  M. Ruel Favors, MD Guidance:  Fluoroscopy FLUOROSCOPY: Fluoroscopy Time: (88.5 mGy). MEDICATIONS: 1% lidocaine local ANESTHESIA/SEDATION: 3.5 mg IV Versed; 175 mcg IV Fentanyl Moderate Sedation Time:  21 minutes The patient was continuously monitored during the procedure by the interventional radiology nurse under my direct supervision. CONTRAST:  None. COMPLICATIONS: None PROCEDURE: Informed consent was obtained from the patient following explanation of the procedure, risks, benefits and alternatives. The patient understands, agrees and consents for the procedure. All questions were addressed. A time out was performed. The patient was positioned prone and fluoroscopy was performed of the pelvis to demonstrate the iliac marrow spaces. Maximal barrier sterile technique utilized including caps, mask, sterile gowns, sterile gloves, large sterile drape, hand hygiene, and Betadine prep. Under sterile conditions and local anesthesia, an 11 gauge coaxial bone biopsy needle was advanced into the right iliac marrow space. Needle position was confirmed with fluoroscopic imaging.  Initially, bone marrow aspiration was performed. Next, the 11 gauge outer cannula was utilized to obtain a right iliac  bone marrow core biopsy. Needle was removed. Hemostasis was obtained with compression. The patient tolerated the procedure well. Samples were prepared with the cytotechnologist. No immediate complications. IMPRESSION: Successful fluoroscopically guided right iliac bone marrow aspiration and core biopsy. Electronically Signed   By: Judie Petit.  Shick M.D.   On: 03/27/2023 11:50     Assessment and plan- Patient is a 52 y.o. female with chronic phase CMLOn Dasatinib here for routine follow-up and discuss bone marrow biopsy results and further management  Patient is a little over 1 year from the start of Dasatinib.  Her BCR-ABL PCR transcripts in the peripheral blood on 2 separate occasions have been a little more than 1%.  This indicates primary resistance to Sprycel given that she has been compliant with the drug.  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  Although her bone marrow BCR-ABL transcripts are less than 1% there continues to be undetectable peripheral blood BCR-ABL transcripts of greater than 1%  I am awaiting her repeat labs from today which will be back in 1 week's time.  If they continue to remain more than 1% I will switch her to nilotinib 400 mg twice daily.  Patient does not have any pre-existing cardiovascular issues.  Discussed risks and benefits of a lot of including all but not limited toNausea vomiting low blood counts, abnormal LFTs, peripheral edema, hyperlipidemia, prolonged QTc hypertension.  I will tentatively see her back in 6 weeks time with repeat labs and lipid profile.  I did offer a second opinion at [facility] for consideration of clinical trial as well but she does not want to proceed with that option at this time.  We will reach out  to her after BCR-ABL CR results are back.   Visit Diagnosis 1. CML (chronic myelocytic leukemia) (HCC)   2. High risk medication use      Dr. Owens Shark, MD, MPH [facility] [phone] 04/13/2023 1:40 PM

## 2023-04-13 NOTE — Progress Notes (Signed)
No concerns. 

## 2023-04-18 ENCOUNTER — Other Ambulatory Visit: Payer: Medicaid Other

## 2023-04-18 LAB — BCR-ABL1 FISH
Cells Analyzed: 200
Cells Counted: 200

## 2023-04-20 LAB — BCR-ABL1, CML/ALL, PCR, QUANT: b2a2 transcript: 0.1092 %

## 2023-04-24 ENCOUNTER — Other Ambulatory Visit: Payer: Self-pay

## 2023-04-24 ENCOUNTER — Other Ambulatory Visit (HOSPITAL_COMMUNITY): Payer: Self-pay

## 2023-04-24 ENCOUNTER — Other Ambulatory Visit: Payer: Self-pay | Admitting: Pharmacist

## 2023-04-24 DIAGNOSIS — C9210 Chronic myeloid leukemia, BCR/ABL-positive, not having achieved remission: Secondary | ICD-10-CM

## 2023-04-24 MED ORDER — DASATINIB 100 MG PO TABS
100.0000 mg | ORAL_TABLET | Freq: Every day | ORAL | 2 refills | Status: DC
Start: 2023-04-24 — End: 2023-08-27
  Filled 2023-04-24: qty 30, 30d supply, fill #0
  Filled 2023-05-29: qty 30, 30d supply, fill #1
  Filled 2023-07-30: qty 30, 30d supply, fill #2

## 2023-04-25 ENCOUNTER — Other Ambulatory Visit: Payer: Self-pay

## 2023-04-25 DIAGNOSIS — J45998 Other asthma: Secondary | ICD-10-CM | POA: Diagnosis not present

## 2023-04-26 ENCOUNTER — Other Ambulatory Visit (HOSPITAL_COMMUNITY): Payer: Self-pay

## 2023-04-26 DIAGNOSIS — Z1152 Encounter for screening for COVID-19: Secondary | ICD-10-CM | POA: Diagnosis not present

## 2023-05-01 DIAGNOSIS — Z1159 Encounter for screening for other viral diseases: Secondary | ICD-10-CM | POA: Diagnosis not present

## 2023-05-02 ENCOUNTER — Ambulatory Visit: Payer: Medicaid Other | Admitting: Oncology

## 2023-05-02 DIAGNOSIS — Z1159 Encounter for screening for other viral diseases: Secondary | ICD-10-CM | POA: Diagnosis not present

## 2023-05-09 DIAGNOSIS — Z1159 Encounter for screening for other viral diseases: Secondary | ICD-10-CM | POA: Diagnosis not present

## 2023-05-16 ENCOUNTER — Other Ambulatory Visit (HOSPITAL_COMMUNITY): Payer: Self-pay

## 2023-05-24 ENCOUNTER — Encounter (HOSPITAL_COMMUNITY): Payer: Self-pay

## 2023-05-24 ENCOUNTER — Other Ambulatory Visit (HOSPITAL_COMMUNITY): Payer: Self-pay

## 2023-05-25 ENCOUNTER — Inpatient Hospital Stay: Payer: Medicaid Other | Admitting: Oncology

## 2023-05-25 ENCOUNTER — Inpatient Hospital Stay: Payer: Medicaid Other

## 2023-05-29 ENCOUNTER — Other Ambulatory Visit (HOSPITAL_COMMUNITY): Payer: Self-pay

## 2023-05-30 ENCOUNTER — Other Ambulatory Visit: Payer: Self-pay

## 2023-05-30 DIAGNOSIS — Z1159 Encounter for screening for other viral diseases: Secondary | ICD-10-CM | POA: Diagnosis not present

## 2023-05-31 DIAGNOSIS — F4312 Post-traumatic stress disorder, chronic: Secondary | ICD-10-CM | POA: Diagnosis not present

## 2023-06-01 DIAGNOSIS — Z1159 Encounter for screening for other viral diseases: Secondary | ICD-10-CM | POA: Diagnosis not present

## 2023-06-02 DIAGNOSIS — Z1152 Encounter for screening for COVID-19: Secondary | ICD-10-CM | POA: Diagnosis not present

## 2023-06-04 DIAGNOSIS — Z1159 Encounter for screening for other viral diseases: Secondary | ICD-10-CM | POA: Diagnosis not present

## 2023-06-05 ENCOUNTER — Inpatient Hospital Stay (HOSPITAL_BASED_OUTPATIENT_CLINIC_OR_DEPARTMENT_OTHER): Payer: Medicaid Other | Admitting: Oncology

## 2023-06-05 ENCOUNTER — Encounter: Payer: Self-pay | Admitting: Oncology

## 2023-06-05 ENCOUNTER — Inpatient Hospital Stay: Payer: Medicaid Other | Attending: Oncology

## 2023-06-05 VITALS — BP 166/98 | HR 74 | Temp 96.3°F | Resp 20 | Wt 313.8 lb

## 2023-06-05 DIAGNOSIS — Z91041 Radiographic dye allergy status: Secondary | ICD-10-CM | POA: Diagnosis not present

## 2023-06-05 DIAGNOSIS — Z888 Allergy status to other drugs, medicaments and biological substances status: Secondary | ICD-10-CM | POA: Insufficient documentation

## 2023-06-05 DIAGNOSIS — Z79899 Other long term (current) drug therapy: Secondary | ICD-10-CM | POA: Insufficient documentation

## 2023-06-05 DIAGNOSIS — F1290 Cannabis use, unspecified, uncomplicated: Secondary | ICD-10-CM | POA: Diagnosis not present

## 2023-06-05 DIAGNOSIS — Z5982 Transportation insecurity: Secondary | ICD-10-CM | POA: Insufficient documentation

## 2023-06-05 DIAGNOSIS — Z87440 Personal history of urinary (tract) infections: Secondary | ICD-10-CM | POA: Insufficient documentation

## 2023-06-05 DIAGNOSIS — Z880 Allergy status to penicillin: Secondary | ICD-10-CM | POA: Insufficient documentation

## 2023-06-05 DIAGNOSIS — Z5986 Financial insecurity: Secondary | ICD-10-CM | POA: Insufficient documentation

## 2023-06-05 DIAGNOSIS — Z9851 Tubal ligation status: Secondary | ICD-10-CM | POA: Diagnosis not present

## 2023-06-05 DIAGNOSIS — Z833 Family history of diabetes mellitus: Secondary | ICD-10-CM | POA: Insufficient documentation

## 2023-06-05 DIAGNOSIS — Z9049 Acquired absence of other specified parts of digestive tract: Secondary | ICD-10-CM | POA: Diagnosis not present

## 2023-06-05 DIAGNOSIS — C9210 Chronic myeloid leukemia, BCR/ABL-positive, not having achieved remission: Secondary | ICD-10-CM | POA: Insufficient documentation

## 2023-06-05 DIAGNOSIS — Z8249 Family history of ischemic heart disease and other diseases of the circulatory system: Secondary | ICD-10-CM | POA: Insufficient documentation

## 2023-06-05 DIAGNOSIS — Z8759 Personal history of other complications of pregnancy, childbirth and the puerperium: Secondary | ICD-10-CM | POA: Insufficient documentation

## 2023-06-05 DIAGNOSIS — Z91030 Bee allergy status: Secondary | ICD-10-CM | POA: Insufficient documentation

## 2023-06-05 DIAGNOSIS — Z86711 Personal history of pulmonary embolism: Secondary | ICD-10-CM | POA: Insufficient documentation

## 2023-06-05 DIAGNOSIS — Z823 Family history of stroke: Secondary | ICD-10-CM | POA: Insufficient documentation

## 2023-06-05 DIAGNOSIS — Z8349 Family history of other endocrine, nutritional and metabolic diseases: Secondary | ICD-10-CM | POA: Insufficient documentation

## 2023-06-05 LAB — COMPREHENSIVE METABOLIC PANEL
ALT: 15 U/L (ref 0–44)
AST: 17 U/L (ref 15–41)
Albumin: 3.8 g/dL (ref 3.5–5.0)
Alkaline Phosphatase: 67 U/L (ref 38–126)
Anion gap: 8 (ref 5–15)
BUN: 12 mg/dL (ref 6–20)
CO2: 29 mmol/L (ref 22–32)
Calcium: 9 mg/dL (ref 8.9–10.3)
Chloride: 102 mmol/L (ref 98–111)
Creatinine, Ser: 0.78 mg/dL (ref 0.44–1.00)
GFR, Estimated: >60 mL/min (ref >60)
Glucose, Bld: 126 mg/dL — ABNORMAL HIGH (ref 70–99)
Potassium: 3.8 mmol/L (ref 3.5–5.1)
Sodium: 139 mmol/L (ref 135–145)
Total Bilirubin: 0.4 mg/dL (ref 0.3–1.2)
Total Protein: 8 g/dL (ref 6.5–8.1)

## 2023-06-05 LAB — CBC WITH DIFFERENTIAL/PLATELET
Abs Immature Granulocytes: 0.02 10*3/uL (ref 0.00–0.07)
Basophils Absolute: 0.1 10*3/uL (ref 0.0–0.1)
Basophils Relative: 1 %
Eosinophils Absolute: 0.2 10*3/uL (ref 0.0–0.5)
Eosinophils Relative: 3 %
HCT: 39.8 % (ref 36.0–46.0)
Hemoglobin: 12.4 g/dL (ref 12.0–15.0)
Immature Granulocytes: 0 %
Lymphocytes Relative: 47 %
Lymphs Abs: 3.8 10*3/uL (ref 0.7–4.0)
MCH: 27.6 pg (ref 26.0–34.0)
MCHC: 31.2 g/dL (ref 30.0–36.0)
MCV: 88.4 fL (ref 80.0–100.0)
Monocytes Absolute: 0.8 10*3/uL (ref 0.1–1.0)
Monocytes Relative: 10 %
Neutro Abs: 3.2 10*3/uL (ref 1.7–7.7)
Neutrophils Relative %: 39 %
Platelets: 279 10*3/uL (ref 150–400)
RBC: 4.5 MIL/uL (ref 3.87–5.11)
RDW: 15.5 % (ref 11.5–15.5)
WBC: 8 10*3/uL (ref 4.0–10.5)
nRBC: 0 % (ref 0.0–0.2)

## 2023-06-05 MED ORDER — CYANOCOBALAMIN 1000 MCG/ML IJ SOLN
1000.0000 ug | Freq: Once | INTRAMUSCULAR | 2 refills | Status: AC
Start: 2023-06-05 — End: 2023-06-05

## 2023-06-05 NOTE — Progress Notes (Signed)
Hematology/Oncology Consult note [facility]  [phone] [phone]  Patient Care Team: Iona Hansen, NP as PCP - General (Nurse Practitioner) Creig Hines, MD as Consulting Physician (Oncology)   Name of the patient: Carol Ortega  [id number]  March 22, 1971   Date of visit: 06/05/23  Diagnosis-chronic phase CML  Chief complaint/ Reason for visit-routine follow-up of CML on Dasatinib  Heme/Onc history: Patient is a 52 year old African-American femaleWho presents to the ER with symptoms of left-sided flank pain.  CT abdomen and pelvis without contrast showed moderate left hydronephrosis secondary to a 1.1 x 0.9 x 1.3 cm calculus at the left ureteropelvic junction/proximal left ureter.  No other evidence of adenopathy and spleen appeared normal.  Incidentally patient was noted to have a white cell count of 138 which on repeat check was 133.  Platelet counts normal and hemoglobin was 12.1.  Last normal CBC was in April 2021 when white count was 7.2.  Presently differential mainly shows neutrophilia along with lymphocytosis monocytosis basophilia and eosinophilia.  Immature granulocytes were seen.  Pathology smear review shows findings concerning for myeloproliferative neoplasm.  Blasts possibly less than 20% and BCR ABL testing was recommended.Patient noted to have wbc of 18 in may 2022 again with predominant left shift.   BCR ABL FISH testing showed 100% nuclei positive for BCR-ABL gene fusion signals.  BCR ABL PCR testing showed B2 A2 transcript 847.313 and B3 A2 transcript less than 0.0032%.  E1 A2 transcript 0.5645%.  Peripheral blood flow cytometry showedAberrant myeloblast population about 2% of the leukocytes.  Absolute neutrophilia eosinophilia and basophilia.  Patient started Dasatinib in early December 2022. Bone marrow biopsy on 01/25/2021 showed hypercellular bone marrow with pan myeloid proliferation.  No increased number of blasts  seen.    Interval history-tolerating Dasatinib well without any significant side effects.  She has been feeling more fatigued over the last 1 week but denies any symptoms of cough shortness of breath nausea vomiting or diarrhea  ECOG PS- 1 Pain scale- 0   Review of systems- Review of Systems  Constitutional:  Negative for chills, fever, malaise/fatigue and weight loss.  HENT:  Negative for congestion, ear discharge and nosebleeds.   Eyes:  Negative for blurred vision.  Respiratory:  Negative for cough, hemoptysis, sputum production, shortness of breath and wheezing.   Cardiovascular:  Negative for chest pain, palpitations, orthopnea and claudication.  Gastrointestinal:  Negative for abdominal pain, blood in stool, constipation, diarrhea, heartburn, melena, nausea and vomiting.  Genitourinary:  Negative for dysuria, flank pain, frequency, hematuria and urgency.  Musculoskeletal:  Negative for back pain, joint pain and myalgias.  Skin:  Negative for rash.  Neurological:  Negative for dizziness, tingling, focal weakness, seizures, weakness and headaches.  Endo/Heme/Allergies:  Does not bruise/bleed easily.  Psychiatric/Behavioral:  Negative for depression and suicidal ideas. The patient does not have insomnia.       Allergies  Allergen Reactions   Bee Venom Anaphylaxis   Contrast Media [Iodinated Contrast Media] Anaphylaxis   Egg-Derived Products Anaphylaxis   Other Anaphylaxis    Surgical dye.. Pt states ok to take if previously taken benadryl or prednisone   Penicillins Hives and Other (See Comments)    Has patient had a PCN reaction causing immediate rash, facial/tongue/throat swelling, SOB or lightheadedness with hypotension: No Has patient had a PCN reaction causing severe rash involving mucus membranes or skin necrosis: No Has patient had a PCN reaction that required hospitalization No Has patient had a  PCN reaction occurring within the last 10 years: No If all of the above  answers are "NO", then may proceed with Cephalosporin use.   Wellbutrin [Bupropion] Other (See Comments)    irritiability   Latex Rash   Oxycodone-Acetaminophen Itching     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   Complication of anesthesia 1999   lung collapse after surgery for gallbladder   Depression    Ectopic pregnancy    Headache    migraines   Hoarseness of voice    Hypertension    Kidney stone    Obesity    Parathyroid abnormality (HCC)    Pulmonary emboli (HCC) 2011   Pulmonary embolism (HCC)    Radiation    Brain tumor   UTI (urinary tract infection)    Vitamin D deficiency      Past Surgical History:  Procedure Laterality Date   CHOLECYSTECTOMY  1999   CYSTOSCOPY W/ RETROGRADES Left 03/01/2018   Procedure: CYSTOSCOPY WITH RETROGRADE PYELOGRAM;  Surgeon: Riki Altes, MD;  Location: [facility];  Service: Urology;  Laterality: Left;   CYSTOSCOPY W/ URETERAL STENT PLACEMENT Right 04/08/2017   Procedure: CYSTOSCOPY WITH RETROGRADE PYELOGRAM/URETERAL STENT PLACEMENT;  Surgeon: Barron Alvine, MD;  Location: [facility];  Service: Urology;  Laterality: Right;   CYSTOSCOPY W/ URETERAL STENT PLACEMENT Left 03/12/2018   Procedure: CYSTOSCOPY WITH STENT REPLACEMENT;  Surgeon: Riki Altes, MD;  Location: [facility];  Service: Urology;  Laterality: Left;   CYSTOSCOPY W/ URETEROSCOPY     CYSTOSCOPY WITH RETROGRADE PYELOGRAM, URETEROSCOPY AND STENT PLACEMENT Left 01/20/2014   Procedure: LEFT URETEROSCOPY WITH STENT PLACEMENT;  Surgeon: Bjorn Pippin, MD;  Location: [facility];  Service: Urology;  Laterality: Left;   CYSTOSCOPY WITH RETROGRADE PYELOGRAM, URETEROSCOPY AND STENT PLACEMENT Bilateral 04/23/2015   Procedure: CYSTOSCOPY WITH BILATERAL RETROGRADE PYELOGRAM, URETEROSCOPY AND STENT PLACEMENT;  Surgeon: Sebastian Ache, MD;  Location: [facility];  Service: Urology;  Laterality: Bilateral;   CYSTOSCOPY WITH RETROGRADE PYELOGRAM, URETEROSCOPY AND STENT PLACEMENT Right 04/13/2017   Procedure: CYSTOSCOPY WITH RETROGRADE PYELOGRAM, URETEROSCOPY AND STENT EXCHANGE;  Surgeon: Sebastian Ache, MD;  Location: [facility];  Service: Urology;  Laterality: Right;   CYSTOSCOPY WITH STENT PLACEMENT Left 01/12/2014   Procedure: CYSTOSCOPY WITH STENT PLACEMENT left retrograde;  Surgeon: Bjorn Pippin, MD;  Location: [facility];  Service: Urology;  Laterality: Left;   CYSTOSCOPY WITH STENT PLACEMENT Left 03/01/2018   Procedure: CYSTOSCOPY WITH STENT PLACEMENT;  Surgeon: Riki Altes, MD;  Location: [facility];  Service: Urology;  Laterality: Left;   CYSTOSCOPY/URETEROSCOPY/HOLMIUM LASER/STENT PLACEMENT Left 03/12/2018   Procedure: CYSTOSCOPY/URETEROSCOPY/HOLMIUM LASER;  Surgeon: Riki Altes, MD;  Location: [facility];  Service: Urology;  Laterality: Left;   CYSTOSCOPY/URETEROSCOPY/HOLMIUM LASER/STENT PLACEMENT Left 11/15/2021   Procedure: CYSTOSCOPY/URETEROSCOPY/HOLMIUM LASER/STENT PLACEMENT;  Surgeon: Riki Altes, MD;  Location: [facility];  Service: Urology;  Laterality: Left;   HOLMIUM LASER APPLICATION Left 01/20/2014   Procedure: HOLMIUM LASER APPLICATION;  Surgeon: Bjorn Pippin, MD;  Location: [facility];  Service: Urology;  Laterality: Left;   HOLMIUM LASER APPLICATION Bilateral 04/23/2015   Procedure: HOLMIUM LASER APPLICATION;  Surgeon: Sebastian Ache, MD;  Location: [facility];  Service: Urology;  Laterality: Bilateral;   HOLMIUM LASER APPLICATION Right 04/13/2017   Procedure: HOLMIUM  LASER APPLICATION;  Surgeon: Sebastian Ache, MD;  Location: [facility];  Service: Urology;  Laterality: Right;   IR BONE MARROW BIOPSY & ASPIRATION  03/27/2023   kidney stone removal     LITHOTRIPSY     PARATHYROIDECTOMY Right 11/13/2016   Procedure: RIGHT SUPERIOR PARATHYROIDECTOMY;  Surgeon: Darnell Level, MD;  Location: [facility];  Service: General;  Laterality:  Right;   surgery for,ectopic pregnancy  2011    1 fallopian tube rupture   TUBAL LIGATION  2011    Social History   Socioeconomic History   Marital status: Divorced    Spouse name: Not on file   Number of children: 4   Years of education: Not on file   Highest education level: Some college, no degree  Occupational History   Not on file  Tobacco Use   Smoking status: Never   Smokeless tobacco: Never  Vaping Use   Vaping Use: Never used  Substance and Sexual Activity   Alcohol use: Not Currently   Drug use: Yes    Types: Marijuana   Sexual activity: Yes  Other Topics Concern   Not on file  Social History Narrative   Lives at home with her son, independent at baselinepend   Social Determinants of Health   Financial Resource Strain: Medium Risk (09/11/2018)   Overall Financial Resource Strain (CARDIA)    Difficulty of Paying Living Expenses: Somewhat hard  Food Insecurity: Food Insecurity Present (09/11/2018)   Hunger Vital Sign    Worried About Running Out of Food in the Last Year: Often true    Ran Out of Food in the Last Year: Often true  Transportation Needs: Unmet Transportation Needs (09/11/2018)   PRAPARE - Administrator, Civil Service (Medical): Yes    Lack of Transportation (Non-Medical): Yes  Physical Activity: Inactive (09/11/2018)   Exercise Vital Sign    Days of Exercise per Week: 0 days    Minutes of Exercise per Session: 0 min  Stress: Stress Concern Present (09/11/2018)   [employer] of Occupational Health - Occupational Stress Questionnaire    Feeling of Stress : Very much  Social Connections: Moderately Isolated (09/11/2018)   Social Connection and Isolation Panel [NHANES]    Frequency of Communication with Friends and Family: More than three times a week    Frequency of Social Gatherings with Friends and Family: More than three times a week    Attends Religious Services: Never    Database administrator or Organizations: No    Attends  Banker Meetings: Never    Marital Status: Divorced  Catering manager Violence: Not At Risk (09/11/2018)   Humiliation, Afraid, Rape, and Kick questionnaire    Fear of Current or Ex-Partner: No    Emotionally Abused: No    Physically Abused: No    Sexually Abused: No    Family History  Problem Relation Age of Onset   Bell's palsy Mother    Heart failure Mother    Hypertension Mother    Lupus Mother    Anxiety disorder Mother    Depression Mother    Stroke Mother    Asthma Father    Hypertension Father    Hyperlipidemia Father    Anxiety disorder Father    Depression Father    Hypertension Maternal Grandmother    Diabetes Maternal Grandmother    Sarcoidosis Maternal Grandmother    Anxiety disorder Sister    Depression Sister    Bipolar disorder Sister  Depression Son    Bipolar disorder Son      Current Outpatient Medications:    albuterol (PROVENTIL) (2.5 MG/3ML) 0.083% nebulizer solution, Take 3 mLs (2.5 mg total) by nebulization every 6 (six) hours as needed for wheezing or shortness of breath., Disp: 75 mL, Rfl: 12   cyanocobalamin (VITAMIN B12) 1000 MCG/ML injection, Inject 1 mL (1,000 mcg total) into the muscle once for 1 dose., Disp: 1 mL, Rfl: 2   dasatinib (SPRYCEL) 100 MG tablet, Take 1 tablet (100 mg total) by mouth daily., Disp: 30 tablet, Rfl: 2   DULoxetine (CYMBALTA) 60 MG capsule, Take 1 capsule by mouth daily., Disp: , Rfl:    EPINEPHrine 0.3 mg/0.3 mL IJ SOAJ injection, Inject into the muscle., Disp: , Rfl:    hydrOXYzine (ATARAX) 10 MG tablet, Take by mouth., Disp: , Rfl:    ondansetron (ZOFRAN-ODT) 4 MG disintegrating tablet, Dis 1 T on the tongue every 6 hours prn, Disp: , Rfl:    triamcinolone ointment (KENALOG) 0.5 %, Apply 1 Application topically 2 (two) times daily., Disp: 30 g, Rfl: 0   Vitamin D, Ergocalciferol, (DRISDOL) 1.25 MG (50000 UNIT) CAPS capsule, Take 1 capsule by mouth once a week., Disp: , Rfl:   Physical exam:   Vitals:   06/05/23 0930  BP: (!) 166/98  Pulse: 74  Resp: 20  Temp: (!) 96.3 F (35.7 C)  SpO2: 100%  Weight: (!) 313 lb 12.8 oz (142.3 kg)   Physical Exam Cardiovascular:     Rate and Rhythm: Normal rate and regular rhythm.     Heart sounds: Normal heart sounds.  Pulmonary:     Effort: Pulmonary effort is normal.     Breath sounds: Normal breath sounds.  Abdominal:     General: Bowel sounds are normal.     Palpations: Abdomen is soft.  Skin:    General: Skin is warm and dry.  Neurological:     Mental Status: She is alert and oriented to person, place, and time.         Latest Ref Rng & Units 06/05/2023    8:42 AM  CMP  Glucose 70 - 99 mg/dL 478   BUN 6 - 20 mg/dL 12   Creatinine 2.95 - 1.00 mg/dL 6.21   Sodium 308 - 657 mmol/L 139   Potassium 3.5 - 5.1 mmol/L 3.8   Chloride 98 - 111 mmol/L 102   CO2 22 - 32 mmol/L 29   Calcium 8.9 - 10.3 mg/dL 9.0   Total Protein 6.5 - 8.1 g/dL 8.0   Total Bilirubin 0.3 - 1.2 mg/dL 0.4   Alkaline Phos 38 - 126 U/L 67   AST 15 - 41 U/L 17   ALT 0 - 44 U/L 15       Latest Ref Rng & Units 06/05/2023    8:42 AM  CBC  WBC 4.0 - 10.5 K/uL 8.0   Hemoglobin 12.0 - 15.0 g/dL 84.6   Hematocrit 96.2 - 46.0 % 39.8   Platelets 150 - 400 K/uL 279      Assessment and plan- Patient is a 52 y.o. female with chronic phase CML on Dasatinib here for routine follow-up  Patient's last BCR-ABL PCR testing from April 2024 was less than 1% and therefore plan was to continue Dasatinib until progression or toxicity.  CBC and CMP are within normal limits.  I will see her back in 3 months with CBC with differential CMP and BCR-ABL PCR testing   Visit Diagnosis  1. CML (chronic myelocytic leukemia) (HCC)   2. High risk medication use      Dr. Owens Shark, MD, MPH [facility] [phone] 06/05/2023 3:58 PM

## 2023-06-05 NOTE — Progress Notes (Signed)
Patient states she stays cold and nausea. Patient states she been extremely fatigue. Patient states she has started taking vitamin d but also noticed her vitamin b12 was low.

## 2023-06-06 DIAGNOSIS — Z1159 Encounter for screening for other viral diseases: Secondary | ICD-10-CM | POA: Diagnosis not present

## 2023-06-08 LAB — BCR-ABL1, CML/ALL, PCR, QUANT
E1A2 Transcript: <0.0032 %
b2a2 transcript: 0.3617 %
b3a2 transcript: <0.0032 %

## 2023-06-19 ENCOUNTER — Other Ambulatory Visit (HOSPITAL_COMMUNITY): Payer: Self-pay

## 2023-06-25 ENCOUNTER — Other Ambulatory Visit (HOSPITAL_COMMUNITY): Payer: Self-pay

## 2023-06-25 ENCOUNTER — Encounter (HOSPITAL_COMMUNITY): Payer: Self-pay

## 2023-07-21 ENCOUNTER — Other Ambulatory Visit: Payer: Self-pay

## 2023-07-21 ENCOUNTER — Emergency Department: Payer: Medicaid Other

## 2023-07-21 ENCOUNTER — Emergency Department
Admission: EM | Admit: 2023-07-21 | Discharge: 2023-07-21 | Disposition: A | Discharge status: Home / Self Care | Payer: Medicaid Other | Attending: Emergency Medicine | Admitting: Emergency Medicine

## 2023-07-21 ENCOUNTER — Encounter: Payer: Self-pay | Admitting: Emergency Medicine

## 2023-07-21 DIAGNOSIS — Z86711 Personal history of pulmonary embolism: Secondary | ICD-10-CM | POA: Diagnosis not present

## 2023-07-21 DIAGNOSIS — N281 Cyst of kidney, acquired: Secondary | ICD-10-CM | POA: Diagnosis not present

## 2023-07-21 DIAGNOSIS — I700 Atherosclerosis of aorta: Secondary | ICD-10-CM | POA: Insufficient documentation

## 2023-07-21 DIAGNOSIS — R140 Abdominal distension (gaseous): Secondary | ICD-10-CM | POA: Insufficient documentation

## 2023-07-21 DIAGNOSIS — R0789 Other chest pain: Secondary | ICD-10-CM | POA: Insufficient documentation

## 2023-07-21 DIAGNOSIS — R109 Unspecified abdominal pain: Secondary | ICD-10-CM | POA: Diagnosis not present

## 2023-07-21 DIAGNOSIS — Z1152 Encounter for screening for COVID-19: Secondary | ICD-10-CM | POA: Diagnosis not present

## 2023-07-21 DIAGNOSIS — R079 Chest pain, unspecified: Secondary | ICD-10-CM | POA: Diagnosis not present

## 2023-07-21 DIAGNOSIS — Z856 Personal history of leukemia: Secondary | ICD-10-CM | POA: Diagnosis not present

## 2023-07-21 DIAGNOSIS — I2510 Atherosclerotic heart disease of native coronary artery without angina pectoris: Secondary | ICD-10-CM | POA: Diagnosis not present

## 2023-07-21 DIAGNOSIS — R0602 Shortness of breath: Secondary | ICD-10-CM | POA: Diagnosis not present

## 2023-07-21 LAB — COMPREHENSIVE METABOLIC PANEL
ALT: 15 U/L (ref 0–44)
AST: 19 U/L (ref 15–41)
Albumin: 3.7 g/dL (ref 3.5–5.0)
Alkaline Phosphatase: 63 U/L (ref 38–126)
Anion gap: 7 (ref 5–15)
BUN: 10 mg/dL (ref 6–20)
CO2: 28 mmol/L (ref 22–32)
Calcium: 8.7 mg/dL — ABNORMAL LOW (ref 8.9–10.3)
Chloride: 102 mmol/L (ref 98–111)
Creatinine, Ser: 0.71 mg/dL (ref 0.44–1.00)
GFR, Estimated: >60 mL/min (ref >60)
Glucose, Bld: 112 mg/dL — ABNORMAL HIGH (ref 70–99)
Potassium: 3.5 mmol/L (ref 3.5–5.1)
Sodium: 137 mmol/L (ref 135–145)
Total Bilirubin: 0.3 mg/dL (ref 0.3–1.2)
Total Protein: 7.8 g/dL (ref 6.5–8.1)

## 2023-07-21 LAB — URINALYSIS, ROUTINE W REFLEX MICROSCOPIC
Bacteria, UA: NONE SEEN
Bilirubin Urine: NEGATIVE
Glucose, UA: NEGATIVE mg/dL
Hgb urine dipstick: NEGATIVE
Ketones, ur: NEGATIVE mg/dL
Leukocytes,Ua: NEGATIVE
Nitrite: NEGATIVE
Protein, ur: 30 mg/dL — AB
Specific Gravity, Urine: 1.015 (ref 1.005–1.030)
pH: 6 (ref 5.0–8.0)

## 2023-07-21 LAB — CBC WITH DIFFERENTIAL/PLATELET
Abs Immature Granulocytes: 0.01 10*3/uL (ref 0.00–0.07)
Basophils Absolute: 0 10*3/uL (ref 0.0–0.1)
Basophils Relative: 1 %
Eosinophils Absolute: 0.2 10*3/uL (ref 0.0–0.5)
Eosinophils Relative: 3 %
HCT: 38.7 % (ref 36.0–46.0)
Hemoglobin: 12 g/dL (ref 12.0–15.0)
Immature Granulocytes: 0 %
Lymphocytes Relative: 40 %
Lymphs Abs: 2.5 10*3/uL (ref 0.7–4.0)
MCH: 27.7 pg (ref 26.0–34.0)
MCHC: 31 g/dL (ref 30.0–36.0)
MCV: 89.4 fL (ref 80.0–100.0)
Monocytes Absolute: 0.7 10*3/uL (ref 0.1–1.0)
Monocytes Relative: 11 %
Neutro Abs: 2.8 10*3/uL (ref 1.7–7.7)
Neutrophils Relative %: 45 %
Platelets: 295 10*3/uL (ref 150–400)
RBC: 4.33 MIL/uL (ref 3.87–5.11)
RDW: 15.3 % (ref 11.5–15.5)
WBC: 6.3 10*3/uL (ref 4.0–10.5)
nRBC: 0 % (ref 0.0–0.2)

## 2023-07-21 LAB — RESP PANEL BY RT-PCR (RSV, FLU A&B, COVID)  RVPGX2
Influenza A by PCR: NEGATIVE
Influenza B by PCR: NEGATIVE
Resp Syncytial Virus by PCR: NEGATIVE
SARS Coronavirus 2 by RT PCR: NEGATIVE

## 2023-07-21 LAB — TROPONIN I (HIGH SENSITIVITY)
Troponin I (High Sensitivity): 4 ng/L (ref <18)
Troponin I (High Sensitivity): 4 ng/L (ref <18)

## 2023-07-21 MED ORDER — DICYCLOMINE HCL 10 MG PO CAPS
10.0000 mg | ORAL_CAPSULE | Freq: Once | ORAL | Status: AC
  Administered 2023-07-21: 10 mg via ORAL
  Filled 2023-07-21: qty 1

## 2023-07-21 MED ORDER — ACETAMINOPHEN 500 MG PO TABS
1000.0000 mg | ORAL_TABLET | Freq: Once | ORAL | Status: AC
  Administered 2023-07-21: 1000 mg via ORAL
  Filled 2023-07-21: qty 2

## 2023-07-21 MED ORDER — ONDANSETRON 4 MG PO TBDP: 4.0000 mg | ORAL_TABLET | Freq: Three times a day (TID) | ORAL | 0 refills | Status: DC | PRN

## 2023-07-21 MED ORDER — IOHEXOL 350 MG/ML SOLN
125.0000 mL | Freq: Once | INTRAVENOUS | Status: AC | PRN
  Administered 2023-07-21: 125 mL via INTRAVENOUS

## 2023-07-21 MED ORDER — DIPHENHYDRAMINE HCL 50 MG/ML IJ SOLN
25.0000 mg | Freq: Once | INTRAMUSCULAR | Status: AC
  Administered 2023-07-21: 25 mg via INTRAVENOUS
  Filled 2023-07-21: qty 1

## 2023-07-21 MED ORDER — DICYCLOMINE HCL 10 MG PO CAPS: 10.0000 mg | ORAL_CAPSULE | Freq: Three times a day (TID) | ORAL | 1 refills | Status: AC

## 2023-07-21 NOTE — ED Triage Notes (Signed)
  Patient comes in with SOB and abdominal that has been going on for 3 days.  Patient states the SOB has gotten progressively worse and has a strong metallic taste in her mouth.  Endorses tingling in bilateral hands.  States abdominal pain feels like gas and has tried OTC meds with no relief.  Patient has hx of chronic myelocytic leukemia and takes chemo pill daily.  Pain 7/10, discomfort.

## 2023-07-21 NOTE — ED Provider Notes (Signed)
[facility] Provider Note    Event Date/Time   First MD Initiated Contact with Patient 07/21/23 0023     (approximate)   History   Shortness of Breath and Abdominal Pain   HPI  Mackenzie Groom is a 52 y.o. female who presents to the ED for evaluation of Shortness of Breath and Abdominal Pain   I review annual PCP visit from May, oncology from April.  History of CML, ureterolithiasis, morbid obesity. Hx PE. Not currently on [facility].   Patient presents, alongside her daughter, for evaluation of various complaints.  She reports 2 days of intermittent right-sided chest pain, dyspnea, left-sided flank pain, bloating and gassiness sensation throughout her abdomen.  Nausea and dizziness without emesis or syncope.  Reports a metallic taste in her mouth and concern regarding her B12 and iron levels.  Reports pain in the right side of her chest that occurred yesterday that was reminiscent of a PE, none of the chest pain today.  Reports that she was feeling dyspneic while laying down for sleep which is why she came in.  Reports feeling better now.   Physical Exam   Triage Vital Signs: ED Triage Vitals [07/21/23 0009]  Encounter Vitals Group     BP (!) 152/98     Systolic BP Percentile      Diastolic BP Percentile      Pulse Rate 87     Resp 20     Temp 98 F (36.7 C)     Temp Source Oral     SpO2 98 %     Weight (!) 313 lb (142 kg)     Height 5\' 6"  (1.676 m)     Head Circumference      Peak Flow      Pain Score 7     Pain Loc      Pain Education      Exclude from Growth Chart     Most recent vital signs: Vitals:   07/21/23 0130 07/21/23 0251  BP:  119/83  Pulse: 71 68  Resp: (!) 23 16  Temp:    SpO2: 100% 100%    General: Awake, no distress.  Morbidly obese, sitting upright, pleasant and conversational full sentences.  Looks well. CV:  Good peripheral perfusion.  RRR Resp:  Normal effort.  Clear without wheezing Abd:  No distention.  Soft and  benign throughout anteriorly MSK:  No deformity noted.  Poor localizing left flank and left CVA tenderness without overlying skin changes or signs of trauma. Neuro:  No focal deficits appreciated. Other:     ED Results / Procedures / Treatments   Labs (all labs ordered are listed, but only abnormal results are displayed) Labs Reviewed  COMPREHENSIVE METABOLIC PANEL - Abnormal; Notable for the following components:      Result Value   Glucose, Bld 112 (*)    Calcium 8.7 (*)    All other components within normal limits  URINALYSIS, ROUTINE W REFLEX MICROSCOPIC - Abnormal; Notable for the following components:   Color, Urine YELLOW (*)    APPearance CLEAR (*)    Protein, ur 30 (*)    All other components within normal limits  RESP PANEL BY RT-PCR (RSV, FLU A&B, COVID)  [id number]  CBC WITH DIFFERENTIAL/PLATELET  TROPONIN I (HIGH SENSITIVITY)  TROPONIN I (HIGH SENSITIVITY)    EKG Tremulous baseline, sinus rhythm with a rate of 79 bpm.  Normal axis and intervals.  No clear signs of acute ischemia.  RADIOLOGY CTA chest interpreted by me without signs of PE CT abdomen/pelvis interpreted by me without signs of ureteral obstruction.  Official radiology report(s): CT Angio Chest PE W and/or Wo Contrast  Result Date: 07/21/2023 CLINICAL DATA:  eval PE. chest pain, left flank pain, hx PE; left flank pain, hx ureteral stones EXAM: CT ANGIOGRAPHY CHEST CT ABDOMEN AND PELVIS WITH CONTRAST TECHNIQUE: Multidetector CT imaging of the chest was performed using the standard protocol during bolus administration of intravenous contrast. Multiplanar CT image reconstructions and MIPs were obtained to evaluate the vascular anatomy. Multidetector CT imaging of the abdomen and pelvis was performed using the standard protocol during bolus administration of intravenous contrast. RADIATION DOSE REDUCTION: This exam was performed according to the departmental dose-optimization program which includes automated  exposure control, adjustment of the mA and/or kV according to patient size and/or use of iterative reconstruction technique. CONTRAST:  OMNIPAQUE IOHEXOL 350 MG/ML SOLN COMPARISON:  CT chest 03/18/2022, CT angio chest 04/03/2018, CT abdomen pelvis 11/08/2021 FINDINGS: CTA CHEST FINDINGS Cardiovascular: Satisfactory opacification of the pulmonary arteries to the segmental level. No evidence of pulmonary embolism. The main pulmonary artery measures at the upper limits of normal (3.2 cm). Normal heart size. No significant pericardial effusion. The thoracic aorta is normal in caliber. Mild atherosclerotic plaque of the thoracic aorta. At least 2 vessel coronary artery calcifications. Mediastinum/Nodes: No enlarged mediastinal, hilar, or axillary lymph nodes. Thyroid gland, trachea, and esophagus demonstrate no significant findings. Lungs/Pleura: Chronic scattered clustered areas of micro nodularity within both lungs again noted. No focal consolidation. No pulmonary nodule. No pulmonary mass. No pleural effusion. No pneumothorax. Musculoskeletal: No chest wall abnormality. No suspicious lytic or blastic osseous lesions. No acute displaced fracture. Review of the MIP images confirms the above findings. CT ABDOMEN and PELVIS FINDINGS Hepatobiliary: No focal liver abnormality. Status post cholecystectomy. No biliary dilatation. Pancreas: No focal lesion. Normal pancreatic contour. No surrounding inflammatory changes. No main pancreatic ductal dilatation. Spleen: Normal in size without focal abnormality. Adrenals/Urinary Tract: No adrenal nodule bilaterally. Bilateral kidneys enhance symmetrically. Fluid density lesions within bilateral kidneys likely represent simple renal cysts. Simple renal cysts, in the absence of clinically indicated signs/symptoms, require no independent follow-up. Subcentimeter hypodensities are too small to characterize-no further follow-up indicated. No hydronephrosis. No hydroureter. The  urinary bladder is unremarkable. On delayed imaging, there is no urothelial wall thickening and there are no filling defects in the opacified portions of the bilateral collecting systems or ureters. Stomach/Bowel: Stomach is within normal limits. No evidence of bowel wall thickening or dilatation. Appendix appears normal. Vascular/Lymphatic: No abdominal aorta or iliac aneurysm. Mild atherosclerotic plaque of the aorta and its branches. No abdominal, pelvic, or inguinal lymphadenopathy. Reproductive: Uterus and bilateral adnexa are unremarkable. Other: No intraperitoneal free fluid. No intraperitoneal free gas. No organized fluid collection. Musculoskeletal: No abdominal wall hernia or abnormality. No suspicious lytic or blastic osseous lesions. No acute displaced fracture. Review of the MIP images confirms the above findings. IMPRESSION: 1. No pulmonary embolus. 2. The main pulmonary artery measures at the upper limits of normal-correlate for possible developing pulmonary hypertension. 3. Chronic scattered clustered areas of micronodularity bilaterally. 4. No acute intrathoracic, intraabdominal, or intrapelvic abnormality. 5. Aortic Atherosclerosis (ICD10-I70.0) including at least 2 vessel coronary artery calcification. Electronically Signed   By: Tish Frederickson M.D.   On: 07/21/2023 02:03   CT ABDOMEN PELVIS W CONTRAST  Result Date: 07/21/2023 CLINICAL DATA:  eval PE. chest pain, left flank pain, hx PE; left flank pain, hx  ureteral stones EXAM: CT ANGIOGRAPHY CHEST CT ABDOMEN AND PELVIS WITH CONTRAST TECHNIQUE: Multidetector CT imaging of the chest was performed using the standard protocol during bolus administration of intravenous contrast. Multiplanar CT image reconstructions and MIPs were obtained to evaluate the vascular anatomy. Multidetector CT imaging of the abdomen and pelvis was performed using the standard protocol during bolus administration of intravenous contrast. RADIATION DOSE REDUCTION: This  exam was performed according to the departmental dose-optimization program which includes automated exposure control, adjustment of the mA and/or kV according to patient size and/or use of iterative reconstruction technique. CONTRAST:  OMNIPAQUE IOHEXOL 350 MG/ML SOLN COMPARISON:  CT chest 03/18/2022, CT angio chest 04/03/2018, CT abdomen pelvis 11/08/2021 FINDINGS: CTA CHEST FINDINGS Cardiovascular: Satisfactory opacification of the pulmonary arteries to the segmental level. No evidence of pulmonary embolism. The main pulmonary artery measures at the upper limits of normal (3.2 cm). Normal heart size. No significant pericardial effusion. The thoracic aorta is normal in caliber. Mild atherosclerotic plaque of the thoracic aorta. At least 2 vessel coronary artery calcifications. Mediastinum/Nodes: No enlarged mediastinal, hilar, or axillary lymph nodes. Thyroid gland, trachea, and esophagus demonstrate no significant findings. Lungs/Pleura: Chronic scattered clustered areas of micro nodularity within both lungs again noted. No focal consolidation. No pulmonary nodule. No pulmonary mass. No pleural effusion. No pneumothorax. Musculoskeletal: No chest wall abnormality. No suspicious lytic or blastic osseous lesions. No acute displaced fracture. Review of the MIP images confirms the above findings. CT ABDOMEN and PELVIS FINDINGS Hepatobiliary: No focal liver abnormality. Status post cholecystectomy. No biliary dilatation. Pancreas: No focal lesion. Normal pancreatic contour. No surrounding inflammatory changes. No main pancreatic ductal dilatation. Spleen: Normal in size without focal abnormality. Adrenals/Urinary Tract: No adrenal nodule bilaterally. Bilateral kidneys enhance symmetrically. Fluid density lesions within bilateral kidneys likely represent simple renal cysts. Simple renal cysts, in the absence of clinically indicated signs/symptoms, require no independent follow-up. Subcentimeter hypodensities are  too small to characterize-no further follow-up indicated. No hydronephrosis. No hydroureter. The urinary bladder is unremarkable. On delayed imaging, there is no urothelial wall thickening and there are no filling defects in the opacified portions of the bilateral collecting systems or ureters. Stomach/Bowel: Stomach is within normal limits. No evidence of bowel wall thickening or dilatation. Appendix appears normal. Vascular/Lymphatic: No abdominal aorta or iliac aneurysm. Mild atherosclerotic plaque of the aorta and its branches. No abdominal, pelvic, or inguinal lymphadenopathy. Reproductive: Uterus and bilateral adnexa are unremarkable. Other: No intraperitoneal free fluid. No intraperitoneal free gas. No organized fluid collection. Musculoskeletal: No abdominal wall hernia or abnormality. No suspicious lytic or blastic osseous lesions. No acute displaced fracture. Review of the MIP images confirms the above findings. IMPRESSION: 1. No pulmonary embolus. 2. The main pulmonary artery measures at the upper limits of normal-correlate for possible developing pulmonary hypertension. 3. Chronic scattered clustered areas of micronodularity bilaterally. 4. No acute intrathoracic, intraabdominal, or intrapelvic abnormality. 5. Aortic Atherosclerosis (ICD10-I70.0) including at least 2 vessel coronary artery calcification. Electronically Signed   By: Tish Frederickson M.D.   On: 07/21/2023 02:03   DG Chest 2 View  Result Date: 07/21/2023 CLINICAL DATA:  Chest pain, shortness of breath EXAM: CHEST - 2 VIEW COMPARISON:  03/18/2022 FINDINGS: Heart and mediastinal contours are within normal limits. Patchy airspace opacities in the right infrahilar region. No focal opacity on the left. No effusions or acute bony abnormality. IMPRESSION: Patchy opacity in the right infrahilar lower lobe. Cannot exclude pneumonia. Electronically Signed   By: Charlett Nose M.D.   On:  07/21/2023 01:26    PROCEDURES and INTERVENTIONS:  .1-3 Lead  EKG Interpretation  Performed by: Delton Prairie, MD Authorized by: Delton Prairie, MD     Interpretation: normal     ECG rate:  76   ECG rate assessment: normal     Rhythm: sinus rhythm     Ectopy: none     Conduction: normal     Medications  acetaminophen (TYLENOL) tablet 1,000 mg (1,000 mg Oral Given 07/21/23 0059)  diphenhydrAMINE (BENADRYL) injection 25 mg (25 mg Intravenous Given 07/21/23 0059)  iohexol (OMNIPAQUE) 350 MG/ML injection 125 mL (125 mLs Intravenous Contrast Given 07/21/23 0133)  dicyclomine (BENTYL) capsule 10 mg (10 mg Oral Given 07/21/23 0241)     IMPRESSION / MDM / ASSESSMENT AND PLAN / ED COURSE  I reviewed the triage vital signs and the nursing notes.  Differential diagnosis includes, but is not limited to, ACS, PTX, PNA, muscle strain/spasm, PE, dissection, anxiety, pleural effusion'  {Patient presents with symptoms of an acute illness or injury that is potentially life-threatening.  Patient with history of CML presents to the ED with multiple complaints and areas of pain, with a benign workup and suitable for trial of outpatient management.  2 negative troponins, normal CBC and metabolic panel.  Urine is clear.  CTA chest without PE and CT abdomen/pelvis without evidence of ureteral stones.  Provided Bentyl and Zofran for her abdominal bloating concerns and discussed return precautions.  She is suitable for outpatient management.  Clinical Course as of 07/21/23 8841  Sat Jul 21, 2023  0222 Reassessed. Discussed reassuring CT imaging.  [DS]    Clinical Course User Index [DS] Delton Prairie, MD     FINAL CLINICAL IMPRESSION(S) / ED DIAGNOSES   Final diagnoses:  Abdominal bloating  Other chest pain     Rx / DC Orders   ED Discharge Orders          Ordered    dicyclomine (BENTYL) 10 MG capsule  3 times daily before meals & bedtime        07/21/23 0218    ondansetron (ZOFRAN-ODT) 4 MG disintegrating tablet  Every 8 hours PRN        07/21/23 0218              Note:  This document was prepared using Dragon voice recognition software and may include unintentional dictation errors.   Delton Prairie, MD 07/21/23 [phone]

## 2023-07-24 DIAGNOSIS — F4312 Post-traumatic stress disorder, chronic: Secondary | ICD-10-CM | POA: Diagnosis not present

## 2023-07-30 ENCOUNTER — Other Ambulatory Visit (HOSPITAL_COMMUNITY): Payer: Self-pay

## 2023-08-07 ENCOUNTER — Other Ambulatory Visit (HOSPITAL_COMMUNITY): Payer: Self-pay

## 2023-08-07 ENCOUNTER — Other Ambulatory Visit: Payer: Self-pay

## 2023-08-16 ENCOUNTER — Other Ambulatory Visit (HOSPITAL_COMMUNITY): Payer: Self-pay

## 2023-08-16 DIAGNOSIS — F330 Major depressive disorder, recurrent, mild: Secondary | ICD-10-CM | POA: Diagnosis not present

## 2023-08-16 DIAGNOSIS — R7303 Prediabetes: Secondary | ICD-10-CM | POA: Diagnosis not present

## 2023-08-16 DIAGNOSIS — F411 Generalized anxiety disorder: Secondary | ICD-10-CM | POA: Diagnosis not present

## 2023-08-16 DIAGNOSIS — S99922A Unspecified injury of left foot, initial encounter: Secondary | ICD-10-CM | POA: Diagnosis not present

## 2023-08-27 ENCOUNTER — Other Ambulatory Visit: Payer: Self-pay

## 2023-08-27 ENCOUNTER — Other Ambulatory Visit (HOSPITAL_COMMUNITY): Payer: Self-pay

## 2023-08-27 ENCOUNTER — Other Ambulatory Visit: Payer: Self-pay | Admitting: Pharmacist

## 2023-08-27 DIAGNOSIS — C9210 Chronic myeloid leukemia, BCR/ABL-positive, not having achieved remission: Secondary | ICD-10-CM

## 2023-08-27 MED ORDER — DASATINIB 100 MG PO TABS
100.0000 mg | ORAL_TABLET | Freq: Every day | ORAL | 2 refills | Status: DC
Start: 2023-08-27 — End: 2023-09-28
  Filled 2023-08-27: qty 30, 30d supply, fill #0
  Filled 2023-09-27: qty 30, 30d supply, fill #1

## 2023-08-29 ENCOUNTER — Other Ambulatory Visit (HOSPITAL_COMMUNITY): Payer: Self-pay

## 2023-09-05 ENCOUNTER — Inpatient Hospital Stay: Payer: Medicaid Other | Attending: Oncology

## 2023-09-05 DIAGNOSIS — Z79899 Other long term (current) drug therapy: Secondary | ICD-10-CM | POA: Insufficient documentation

## 2023-09-05 DIAGNOSIS — Z880 Allergy status to penicillin: Secondary | ICD-10-CM | POA: Insufficient documentation

## 2023-09-05 DIAGNOSIS — Z87440 Personal history of urinary (tract) infections: Secondary | ICD-10-CM | POA: Diagnosis not present

## 2023-09-05 DIAGNOSIS — C9210 Chronic myeloid leukemia, BCR/ABL-positive, not having achieved remission: Secondary | ICD-10-CM | POA: Insufficient documentation

## 2023-09-05 DIAGNOSIS — Z9851 Tubal ligation status: Secondary | ICD-10-CM | POA: Diagnosis not present

## 2023-09-05 DIAGNOSIS — Z888 Allergy status to other drugs, medicaments and biological substances status: Secondary | ICD-10-CM | POA: Insufficient documentation

## 2023-09-05 DIAGNOSIS — Z86711 Personal history of pulmonary embolism: Secondary | ICD-10-CM | POA: Diagnosis not present

## 2023-09-05 DIAGNOSIS — Z9049 Acquired absence of other specified parts of digestive tract: Secondary | ICD-10-CM | POA: Insufficient documentation

## 2023-09-05 DIAGNOSIS — F1290 Cannabis use, unspecified, uncomplicated: Secondary | ICD-10-CM | POA: Diagnosis not present

## 2023-09-05 DIAGNOSIS — Z5986 Financial insecurity: Secondary | ICD-10-CM | POA: Diagnosis not present

## 2023-09-05 DIAGNOSIS — Z8349 Family history of other endocrine, nutritional and metabolic diseases: Secondary | ICD-10-CM | POA: Insufficient documentation

## 2023-09-05 DIAGNOSIS — Z91041 Radiographic dye allergy status: Secondary | ICD-10-CM | POA: Diagnosis not present

## 2023-09-05 DIAGNOSIS — Z91030 Bee allergy status: Secondary | ICD-10-CM | POA: Diagnosis not present

## 2023-09-05 DIAGNOSIS — Z833 Family history of diabetes mellitus: Secondary | ICD-10-CM | POA: Insufficient documentation

## 2023-09-05 DIAGNOSIS — Z8249 Family history of ischemic heart disease and other diseases of the circulatory system: Secondary | ICD-10-CM | POA: Insufficient documentation

## 2023-09-05 DIAGNOSIS — Z8759 Personal history of other complications of pregnancy, childbirth and the puerperium: Secondary | ICD-10-CM | POA: Diagnosis not present

## 2023-09-05 DIAGNOSIS — Z823 Family history of stroke: Secondary | ICD-10-CM | POA: Insufficient documentation

## 2023-09-05 DIAGNOSIS — Z5982 Transportation insecurity: Secondary | ICD-10-CM | POA: Diagnosis not present

## 2023-09-05 LAB — CBC WITH DIFFERENTIAL (CANCER CENTER ONLY)
Abs Immature Granulocytes: 0.01 10*3/uL (ref 0.00–0.07)
Basophils Absolute: 0 10*3/uL (ref 0.0–0.1)
Basophils Relative: 0 %
Eosinophils Absolute: 0.2 10*3/uL (ref 0.0–0.5)
Eosinophils Relative: 3 %
HCT: 40.1 % (ref 36.0–46.0)
Hemoglobin: 12.2 g/dL (ref 12.0–15.0)
Immature Granulocytes: 0 %
Lymphocytes Relative: 47 %
Lymphs Abs: 3.2 10*3/uL (ref 0.7–4.0)
MCH: 27.6 pg (ref 26.0–34.0)
MCHC: 30.4 g/dL (ref 30.0–36.0)
MCV: 90.7 fL (ref 80.0–100.0)
Monocytes Absolute: 0.7 10*3/uL (ref 0.1–1.0)
Monocytes Relative: 10 %
Neutro Abs: 2.7 10*3/uL (ref 1.7–7.7)
Neutrophils Relative %: 40 %
Platelet Count: 284 10*3/uL (ref 150–400)
RBC: 4.42 MIL/uL (ref 3.87–5.11)
RDW: 14.9 % (ref 11.5–15.5)
WBC Count: 6.9 10*3/uL (ref 4.0–10.5)
nRBC: 0 % (ref 0.0–0.2)

## 2023-09-05 LAB — CMP (CANCER CENTER ONLY)
ALT: 13 U/L (ref 0–44)
AST: 15 U/L (ref 15–41)
Albumin: 3.4 g/dL — ABNORMAL LOW (ref 3.5–5.0)
Alkaline Phosphatase: 60 U/L (ref 38–126)
Anion gap: 4 — ABNORMAL LOW (ref 5–15)
BUN: 9 mg/dL (ref 6–20)
CO2: 27 mmol/L (ref 22–32)
Calcium: 8.7 mg/dL — ABNORMAL LOW (ref 8.9–10.3)
Chloride: 106 mmol/L (ref 98–111)
Creatinine: 0.93 mg/dL (ref 0.44–1.00)
GFR, Estimated: >60 mL/min (ref >60)
Glucose, Bld: 103 mg/dL — ABNORMAL HIGH (ref 70–99)
Potassium: 3.9 mmol/L (ref 3.5–5.1)
Sodium: 137 mmol/L (ref 135–145)
Total Bilirubin: 0.3 mg/dL (ref 0.3–1.2)
Total Protein: 7.3 g/dL (ref 6.5–8.1)

## 2023-09-12 LAB — BCR-ABL1, CML/ALL, PCR, QUANT
E1A2 Transcript: <0.0032 %
b2a2 transcript: 0.5522 %
b3a2 transcript: <0.0032 %

## 2023-09-17 ENCOUNTER — Inpatient Hospital Stay: Payer: Medicaid Other | Admitting: Oncology

## 2023-09-21 ENCOUNTER — Inpatient Hospital Stay: Payer: Medicaid Other | Attending: Oncology | Admitting: Oncology

## 2023-09-21 ENCOUNTER — Encounter: Payer: Self-pay | Admitting: Oncology

## 2023-09-21 VITALS — BP 137/98 | HR 79 | Temp 96.4°F | Resp 18 | Ht 67.0 in | Wt 311.6 lb

## 2023-09-21 DIAGNOSIS — C9210 Chronic myeloid leukemia, BCR/ABL-positive, not having achieved remission: Secondary | ICD-10-CM | POA: Insufficient documentation

## 2023-09-21 DIAGNOSIS — Z79899 Other long term (current) drug therapy: Secondary | ICD-10-CM | POA: Diagnosis not present

## 2023-09-21 DIAGNOSIS — M79672 Pain in left foot: Secondary | ICD-10-CM | POA: Diagnosis not present

## 2023-09-21 NOTE — Progress Notes (Signed)
Hematology/Oncology Consult note [facility]  [phone] [phone]  Patient Care Team: Iona Hansen, NP as PCP - General (Nurse Practitioner) Creig Hines, MD as Consulting Physician (Oncology)   Name of the patient: Carol Ortega  [id number]  06-09-1971   Date of visit: 09/21/23  Diagnosis- chronic phase CML   Chief complaint/ Reason for visit- routine f/u CML on Dasatinib  Heme/Onc history: Patient is a 52 year old African-American femaleWho presents to the ER with symptoms of left-sided flank pain.  CT abdomen and pelvis without contrast showed moderate left hydronephrosis secondary to a 1.1 x 0.9 x 1.3 cm calculus at the left ureteropelvic junction/proximal left ureter.  No other evidence of adenopathy and spleen appeared normal.  Incidentally patient was noted to have a white cell count of 138 which on repeat check was 133.  Platelet counts normal and hemoglobin was 12.1.  Last normal CBC was in April 2021 when white count was 7.2.  Presently differential mainly shows neutrophilia along with lymphocytosis monocytosis basophilia and eosinophilia.  Immature granulocytes were seen.  Pathology smear review shows findings concerning for myeloproliferative neoplasm.  Blasts possibly less than 20% and BCR ABL testing was recommended.Patient noted to have wbc of 18 in may 2022 again with predominant left shift.   BCR ABL FISH testing showed 100% nuclei positive for BCR-ABL gene fusion signals.  BCR ABL PCR testing showed B2 A2 transcript 847.313 and B3 A2 transcript less than 0.0032%.  E1 A2 transcript 0.5645%.  Peripheral blood flow cytometry showedAberrant myeloblast population about 2% of the leukocytes.  Absolute neutrophilia eosinophilia and basophilia.  Patient started Dasatinib in early December 2022. Bone marrow biopsy on 01/25/2021 showed hypercellular bone marrow with pan myeloid proliferation.  No increased number of blasts seen.  There  was a concern for increasing BCR-ABL transcripts in April 95188.  Mutational analysis showedp.([id number]) of uncertain significance.Repeat bone marrow biopsy did not reveal any evidence of progression.  Orderly maturation noted in erythroid and granulocytic precursors.  Megakaryocytes slightly increased in number with focal clustering and slight hyper lobulation.  Subsequently BCR-ABL transcripts decreased to less than 1% and therefore patient was continued on Dasatinib.  Interval history-patient is tolerating Dasatinib well without any significant side effects.  She reports pain in the lateral aspect of her left foot which has been ongoing for the last 1 week.  Area under her little toe is exquisitely sensitive to touch and she is unable to wear most of her shoes because of that.  Denies any overt falls.  ECOG PS- 1 Pain scale- 5 Opioid associated constipation- no  Review of systems- Review of Systems  Constitutional:  Negative for chills, fever, malaise/fatigue and weight loss.  HENT:  Negative for congestion, ear discharge and nosebleeds.   Eyes:  Negative for blurred vision.  Respiratory:  Negative for cough, hemoptysis, sputum production, shortness of breath and wheezing.   Cardiovascular:  Negative for chest pain, palpitations, orthopnea and claudication.  Gastrointestinal:  Negative for abdominal pain, blood in stool, constipation, diarrhea, heartburn, melena, nausea and vomiting.  Genitourinary:  Negative for dysuria, flank pain, frequency, hematuria and urgency.  Musculoskeletal:  Negative for back pain, joint pain (Left foot pain) and myalgias.  Skin:  Negative for rash.  Neurological:  Negative for dizziness, tingling, focal weakness, seizures, weakness and headaches.  Endo/Heme/Allergies:  Does not bruise/bleed easily.  Psychiatric/Behavioral:  Negative for depression and suicidal ideas. The patient does not have insomnia.  Allergies  Allergen Reactions   Bee Venom  Anaphylaxis   Egg-Derived Products Anaphylaxis   Other Anaphylaxis    Surgical dye.. Pt states ok to take if previously taken benadryl or prednisone   Penicillins Hives and Other (See Comments)    Has patient had a PCN reaction causing immediate rash, facial/tongue/throat swelling, SOB or lightheadedness with hypotension: No Has patient had a PCN reaction causing severe rash involving mucus membranes or skin necrosis: No Has patient had a PCN reaction that required hospitalization No Has patient had a PCN reaction occurring within the last 10 years: No If all of the above answers are "NO", then may proceed with Cephalosporin use.   Wellbutrin [Bupropion] Other (See Comments)    irritiability   Latex Rash   Oxycodone-Acetaminophen Itching     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   Complication of anesthesia 1999   lung collapse after surgery for gallbladder   Depression    Ectopic pregnancy    Headache    migraines   Hoarseness of voice    Hypertension    Kidney stone    Obesity    Parathyroid abnormality (HCC)    Pulmonary emboli (HCC) 2011   Pulmonary embolism (HCC)    Radiation    Brain tumor   UTI (urinary tract infection)    Vitamin D deficiency      Past Surgical History:  Procedure Laterality Date   CHOLECYSTECTOMY  1999   CYSTOSCOPY W/ RETROGRADES Left 03/01/2018   Procedure: CYSTOSCOPY WITH RETROGRADE PYELOGRAM;  Surgeon: Riki Altes, MD;  Location: [facility];  Service: Urology;  Laterality: Left;   CYSTOSCOPY W/ URETERAL STENT PLACEMENT Right 04/08/2017   Procedure: CYSTOSCOPY WITH RETROGRADE PYELOGRAM/URETERAL STENT PLACEMENT;  Surgeon: Barron Alvine, MD;  Location: [facility];  Service: Urology;  Laterality: Right;   CYSTOSCOPY W/ URETERAL STENT PLACEMENT Left 03/12/2018   Procedure:  CYSTOSCOPY WITH STENT REPLACEMENT;  Surgeon: Riki Altes, MD;  Location: [facility];  Service: Urology;  Laterality: Left;   CYSTOSCOPY W/ URETEROSCOPY     CYSTOSCOPY WITH RETROGRADE PYELOGRAM, URETEROSCOPY AND STENT PLACEMENT Left 01/20/2014   Procedure: LEFT URETEROSCOPY WITH STENT PLACEMENT;  Surgeon: Bjorn Pippin, MD;  Location: [facility];  Service: Urology;  Laterality: Left;   CYSTOSCOPY WITH RETROGRADE PYELOGRAM, URETEROSCOPY AND STENT PLACEMENT Bilateral 04/23/2015   Procedure: CYSTOSCOPY WITH BILATERAL RETROGRADE PYELOGRAM, URETEROSCOPY AND STENT PLACEMENT;  Surgeon: Sebastian Ache, MD;  Location: [facility];  Service: Urology;  Laterality: Bilateral;   CYSTOSCOPY WITH RETROGRADE PYELOGRAM, URETEROSCOPY AND STENT PLACEMENT Right 04/13/2017   Procedure: CYSTOSCOPY WITH RETROGRADE PYELOGRAM, URETEROSCOPY AND STENT EXCHANGE;  Surgeon: Sebastian Ache, MD;  Location: [facility];  Service: Urology;  Laterality: Right;   CYSTOSCOPY WITH STENT PLACEMENT Left 01/12/2014   Procedure: CYSTOSCOPY WITH STENT PLACEMENT left retrograde;  Surgeon: Bjorn Pippin, MD;  Location: [facility];  Service: Urology;  Laterality: Left;   CYSTOSCOPY WITH STENT PLACEMENT Left 03/01/2018   Procedure: CYSTOSCOPY WITH STENT PLACEMENT;  Surgeon: Riki Altes, MD;  Location: [facility];  Service: Urology;  Laterality: Left;   CYSTOSCOPY/URETEROSCOPY/HOLMIUM LASER/STENT PLACEMENT Left 03/12/2018   Procedure: CYSTOSCOPY/URETEROSCOPY/HOLMIUM LASER;  Surgeon: Riki Altes, MD;  Location: [facility];  Service: Urology;  Laterality: Left;   CYSTOSCOPY/URETEROSCOPY/HOLMIUM LASER/STENT  PLACEMENT Left 11/15/2021   Procedure: CYSTOSCOPY/URETEROSCOPY/HOLMIUM LASER/STENT PLACEMENT;  Surgeon: Riki Altes, MD;  Location: [facility];  Service: Urology;  Laterality: Left;   HOLMIUM LASER APPLICATION Left 01/20/2014   Procedure: HOLMIUM LASER APPLICATION;  Surgeon: Bjorn Pippin, MD;  Location: [facility];  Service: Urology;  Laterality: Left;   HOLMIUM LASER  APPLICATION Bilateral 04/23/2015   Procedure: HOLMIUM LASER APPLICATION;  Surgeon: Sebastian Ache, MD;  Location: [facility];  Service: Urology;  Laterality: Bilateral;   HOLMIUM LASER APPLICATION Right 04/13/2017   Procedure: HOLMIUM LASER APPLICATION;  Surgeon: Sebastian Ache, MD;  Location: [facility];  Service: Urology;  Laterality: Right;   IR BONE MARROW BIOPSY & ASPIRATION  03/27/2023   kidney stone removal     LITHOTRIPSY     PARATHYROIDECTOMY Right 11/13/2016   Procedure: RIGHT SUPERIOR PARATHYROIDECTOMY;  Surgeon: Darnell Level, MD;  Location: [facility];  Service: General;  Laterality: Right;   surgery for,ectopic pregnancy  2011    1 fallopian tube rupture   TUBAL LIGATION  2011    Social History   Socioeconomic History   Marital status: Divorced    Spouse name: Not on file   Number of children: 4   Years of education: Not on file   Highest education level: Some college, no degree  Occupational History   Not on file  Tobacco Use   Smoking status: Never   Smokeless tobacco: Never  Vaping Use   Vaping status: Never Used  Substance and Sexual Activity   Alcohol use: Not Currently   Drug use: Yes    Types: Marijuana   Sexual activity: Yes  Other Topics Concern   Not on file  Social History Narrative   Lives at home with her son, independent at baselinepend   Social Determinants of Health   Financial Resource Strain: Medium Risk (09/11/2018)   Overall Financial Resource Strain (CARDIA)    Difficulty of Paying Living Expenses: Somewhat hard  Food Insecurity: Low Risk  (08/16/2023)   Received from [facility]   Hunger Vital Sign    Worried About Running Out of Food in the Last Year: Never true    Ran Out of Food in the Last Year: Never true  Transportation Needs: No Transportation Needs (08/16/2023)   Received from [facility]    In the past 12 months, has lack of reliable transportation kept you from medical appointments, meetings, work or from getting things  needed for daily living? : No  Physical Activity: Inactive (09/11/2018)   Exercise Vital Sign    Days of Exercise per Week: 0 days    Minutes of Exercise per Session: 0 min  Stress: Stress Concern Present (09/11/2018)   [employer] of Occupational Health - Occupational Stress Questionnaire    Feeling of Stress : Very much  Social Connections: Moderately Isolated (09/11/2018)   Social Connection and Isolation Panel [NHANES]    Frequency of Communication with Friends and Family: More than three times a week    Frequency of Social Gatherings with Friends and Family: More than three times a week    Attends Religious Services: Never    Database administrator or Organizations: No    Attends Banker Meetings: Never    Marital Status: Divorced  Catering manager Violence: Not At Risk (09/11/2018)   Humiliation, Afraid, Rape, and Kick questionnaire    Fear of Current or Ex-Partner: No    Emotionally Abused: No    Physically Abused: No  Sexually Abused: No    Family History  Problem Relation Age of Onset   Bell's palsy Mother    Heart failure Mother    Hypertension Mother    Lupus Mother    Anxiety disorder Mother    Depression Mother    Stroke Mother    Asthma Father    Hypertension Father    Hyperlipidemia Father    Anxiety disorder Father    Depression Father    Hypertension Maternal Grandmother    Diabetes Maternal Grandmother    Sarcoidosis Maternal Grandmother    Anxiety disorder Sister    Depression Sister    Bipolar disorder Sister    Depression Son    Bipolar disorder Son      Current Outpatient Medications:    acetaminophen (TYLENOL) 325 MG tablet, Take 650 mg by mouth every 6 (six) hours as needed for mild pain., Disp: , Rfl:    albuterol (PROVENTIL) (2.5 MG/3ML) 0.083% nebulizer solution, Take 3 mLs (2.5 mg total) by nebulization every 6 (six) hours as needed for wheezing or shortness of breath., Disp: 75 mL, Rfl: 12   dasatinib (SPRYCEL) 100 MG  tablet, Take 1 tablet (100 mg total) by mouth daily., Disp: 30 tablet, Rfl: 2   EPINEPHrine 0.3 mg/0.3 mL IJ SOAJ injection, Inject into the muscle., Disp: , Rfl:    ondansetron (ZOFRAN-ODT) 4 MG disintegrating tablet, Dis 1 T on the tongue every 6 hours prn, Disp: , Rfl:    VENTOLIN HFA 108 (90 Base) MCG/ACT inhaler, Inhale 2 puffs into the lungs every 6 (six) hours as needed., Disp: , Rfl:    Vitamin D, Ergocalciferol, (DRISDOL) 1.25 MG (50000 UNIT) CAPS capsule, Take 1 capsule by mouth once a week., Disp: , Rfl:    dicyclomine (BENTYL) 10 MG capsule, Take 1 capsule (10 mg total) by mouth 4 (four) times daily -  before meals and at bedtime. (Patient not taking: Reported on 09/21/2023), Disp: 90 capsule, Rfl: 1   DULoxetine (CYMBALTA) 60 MG capsule, Take 1 capsule by mouth daily. (Patient not taking: Reported on 09/21/2023), Disp: , Rfl:    hydrOXYzine (ATARAX) 10 MG tablet, Take 10 mg by mouth at bedtime. (Patient not taking: Reported on 09/21/2023), Disp: , Rfl:    triamcinolone ointment (KENALOG) 0.5 %, Apply 1 Application topically 2 (two) times daily. (Patient not taking: Reported on 07/21/2023), Disp: 30 g, Rfl: 0  Physical exam:  Vitals:   09/21/23 1123  BP: (!) 137/98  Pulse: 79  Resp: 18  Temp: (!) 96.4 F (35.8 C)  TempSrc: Tympanic  SpO2: 100%  Weight: (!) 311 lb 9.6 oz (141.3 kg)  Height: 5\' 7"  (1.702 m)   Physical Exam Cardiovascular:     Rate and Rhythm: Normal rate and regular rhythm.     Heart sounds: Normal heart sounds.  Pulmonary:     Effort: Pulmonary effort is normal.     Breath sounds: Normal breath sounds.  Abdominal:     General: Bowel sounds are normal.     Palpations: Abdomen is soft.  Musculoskeletal:     Comments: There is tenderness to palpation along the lateral aspect of the left foot just below her little toe.  No overt swelling or deformity  Skin:    General: Skin is warm and dry.  Neurological:     Mental Status: She is alert and oriented to person,  place, and time.         Latest Ref Rng & Units 09/05/2023  9:17 AM  CMP  Glucose 70 - 99 mg/dL 782   BUN 6 - 20 mg/dL 9   Creatinine 9.56 - 2.13 mg/dL 0.86   Sodium 578 - 469 mmol/L 137   Potassium 3.5 - 5.1 mmol/L 3.9   Chloride 98 - 111 mmol/L 106   CO2 22 - 32 mmol/L 27   Calcium 8.9 - 10.3 mg/dL 8.7   Total Protein 6.5 - 8.1 g/dL 7.3   Total Bilirubin 0.3 - 1.2 mg/dL 0.3   Alkaline Phos 38 - 126 U/L 60   AST 15 - 41 U/L 15   ALT 0 - 44 U/L 13       Latest Ref Rng & Units 09/05/2023    9:17 AM  CBC  WBC 4.0 - 10.5 K/uL 6.9   Hemoglobin 12.0 - 15.0 g/dL 62.9   Hematocrit 52.8 - 46.0 % 40.1   Platelets 150 - 400 K/uL 284      Assessment and plan- Patient is a 52 y.o. female with history of chronic phase CML currentlyOn Dasatinib here for routine follow-up  CBC and CMP remain unremarkable at this time.  White count and platelets are normal.  BCR-ABL B2 A2 transcripts are elevated at 0.55% as compared to 0.36% 3 months ago.  In April 2024 they were down to 0.1%.  I will continue to monitor this as long as it is less than 1% I do not plan to change her treatment at this time.  Left foot pain: Etiology unclear I am getting left foot x-ray at this time  CBC with differential CMP and BCR-ABL PCR testing in 3 months and see me thereafter   Visit Diagnosis 1. Foot pain, left   2. CML (chronic myelocytic leukemia) (HCC)   3. High risk medication use      Dr. Owens Shark, MD, MPH [facility] [phone] 09/21/2023 12:44 PM

## 2023-09-25 ENCOUNTER — Ambulatory Visit
Admission: RE | Admit: 2023-09-25 | Discharge: 2023-09-25 | Disposition: A | Discharge status: Home / Self Care | Payer: Medicaid Other | Source: Ambulatory Visit | Attending: Oncology | Admitting: Oncology

## 2023-09-25 DIAGNOSIS — M79672 Pain in left foot: Secondary | ICD-10-CM | POA: Diagnosis not present

## 2023-09-27 ENCOUNTER — Other Ambulatory Visit: Payer: Self-pay

## 2023-09-27 NOTE — Progress Notes (Addendum)
Specialty Pharmacy Refill Coordination Note  Carol Ortega is a 52 y.o. female contacted today regarding refills of specialty medication(s) Dasatinib   Patient requested Delivery   Delivery date: 10/01/23   Verified address: [address], [address]   Medication will be filled on 09/28/23.     Update: Generic now available, and insurance will not pay for it. New DAW 1 prescription obtained and medication will ship 10/14 for 10/15. Patient is aware and has enough medication.

## 2023-09-28 ENCOUNTER — Other Ambulatory Visit: Payer: Self-pay | Admitting: Pharmacist

## 2023-09-28 ENCOUNTER — Telehealth: Payer: Self-pay

## 2023-09-28 ENCOUNTER — Other Ambulatory Visit: Payer: Self-pay

## 2023-09-28 DIAGNOSIS — C9210 Chronic myeloid leukemia, BCR/ABL-positive, not having achieved remission: Secondary | ICD-10-CM

## 2023-09-28 MED ORDER — SPRYCEL 100 MG PO TABS
100.0000 mg | ORAL_TABLET | Freq: Every day | ORAL | 0 refills | Status: DC
Start: 2023-09-28 — End: 2023-10-23
  Filled 2023-09-28: qty 30, 30d supply, fill #0

## 2023-10-01 NOTE — Telephone Encounter (Signed)
Oral Oncology Pharmacist Encounter   Patient's insurance requires prescription to be brand name with DAW1 in order for patient to fill since the medication Sprycel, has become generic. Since prescription had already been filled last month using current prescription with a refill, prescription changed to no refills under Dr. Smith Robert.  Patient last seen by MD on 10/4 and will continue spyrcel per MD note.   [facility] will follow up with outside pharmacy to ensure Rx is delivered.   Bethel Born, PharmD Hematology/Oncology Clinical Pharmacist [facility] [phone]

## 2023-10-06 ENCOUNTER — Emergency Department
Admission: EM | Admit: 2023-10-06 | Discharge: 2023-10-07 | Disposition: A | Discharge status: Home / Self Care | Payer: Medicaid Other | Attending: Emergency Medicine | Admitting: Emergency Medicine

## 2023-10-06 ENCOUNTER — Other Ambulatory Visit: Payer: Self-pay

## 2023-10-06 ENCOUNTER — Emergency Department: Payer: Medicaid Other

## 2023-10-06 DIAGNOSIS — R059 Cough, unspecified: Secondary | ICD-10-CM | POA: Diagnosis not present

## 2023-10-06 DIAGNOSIS — B379 Candidiasis, unspecified: Secondary | ICD-10-CM | POA: Insufficient documentation

## 2023-10-06 DIAGNOSIS — Z1152 Encounter for screening for COVID-19: Secondary | ICD-10-CM | POA: Insufficient documentation

## 2023-10-06 DIAGNOSIS — J069 Acute upper respiratory infection, unspecified: Secondary | ICD-10-CM | POA: Insufficient documentation

## 2023-10-06 DIAGNOSIS — R509 Fever, unspecified: Secondary | ICD-10-CM | POA: Diagnosis not present

## 2023-10-06 DIAGNOSIS — B9789 Other viral agents as the cause of diseases classified elsewhere: Secondary | ICD-10-CM | POA: Diagnosis not present

## 2023-10-06 DIAGNOSIS — R21 Rash and other nonspecific skin eruption: Secondary | ICD-10-CM

## 2023-10-06 DIAGNOSIS — R0981 Nasal congestion: Secondary | ICD-10-CM | POA: Diagnosis present

## 2023-10-06 LAB — CBC
HCT: 37.7 % (ref 36.0–46.0)
Hemoglobin: 11.8 g/dL — ABNORMAL LOW (ref 12.0–15.0)
MCH: 27.8 pg (ref 26.0–34.0)
MCHC: 31.3 g/dL (ref 30.0–36.0)
MCV: 88.9 fL (ref 80.0–100.0)
Platelets: 273 10*3/uL (ref 150–400)
RBC: 4.24 MIL/uL (ref 3.87–5.11)
RDW: 14.9 % (ref 11.5–15.5)
WBC: 5 10*3/uL (ref 4.0–10.5)
nRBC: 0 % (ref 0.0–0.2)

## 2023-10-06 LAB — BASIC METABOLIC PANEL
Anion gap: 7 (ref 5–15)
BUN: 7 mg/dL (ref 6–20)
CO2: 27 mmol/L (ref 22–32)
Calcium: 8.8 mg/dL — ABNORMAL LOW (ref 8.9–10.3)
Chloride: 108 mmol/L (ref 98–111)
Creatinine, Ser: 0.74 mg/dL (ref 0.44–1.00)
GFR, Estimated: >60 mL/min (ref >60)
Glucose, Bld: 106 mg/dL — ABNORMAL HIGH (ref 70–99)
Potassium: 3.9 mmol/L (ref 3.5–5.1)
Sodium: 142 mmol/L (ref 135–145)

## 2023-10-06 NOTE — ED Notes (Signed)
Pt declines to be tested for covid at this time, states took 2 tests that were neg.

## 2023-10-06 NOTE — ED Triage Notes (Signed)
Pt to ED for congestion, left ear pain, cough, fever for a few days. NAD noted Also reports rash under breasts and to vaginal area.  Pt has leukemia

## 2023-10-07 LAB — RESP PANEL BY RT-PCR (RSV, FLU A&B, COVID)  RVPGX2
Influenza A by PCR: NEGATIVE
Influenza B by PCR: NEGATIVE
Resp Syncytial Virus by PCR: NEGATIVE
SARS Coronavirus 2 by RT PCR: NEGATIVE

## 2023-10-07 MED ORDER — TRIAMCINOLONE ACETONIDE 0.5 % EX OINT: 1.0000 | TOPICAL_OINTMENT | Freq: Two times a day (BID) | CUTANEOUS | 0 refills | Status: AC

## 2023-10-07 MED ORDER — NYSTATIN 100000 UNIT/GM EX OINT: 1.0000 | TOPICAL_OINTMENT | Freq: Two times a day (BID) | CUTANEOUS | 0 refills | Status: AC

## 2023-10-07 MED ORDER — FLUCONAZOLE 150 MG PO TABS: 150.0000 mg | ORAL_TABLET | Freq: Once | ORAL | 0 refills | Status: AC

## 2023-10-07 NOTE — ED Notes (Signed)
Discharge instructions reviewed.   Newly prescribed medications reviewed. Pharmacy verified.   Opportunity for questions and concerns provided.   No distress noted prior to departure.

## 2023-10-07 NOTE — ED Provider Notes (Signed)
[facility] Provider Note    Event Date/Time   First MD Initiated Contact with Patient 10/06/23 2356     (approximate)   History   Nasal Congestion   HPI Carol Ortega is a 52 y.o. female with history of CLL.  She presents for evaluation of several days of upper respiratory symptoms that include nasal congestion, runny nose, left ear pressure and pain, mild sore throat.  She also has a rash underneath her breasts and underneath her stomach folds that she said got better after using nystatin ointment but then when she ran out of the ointment it got worse again.  It is itchy and painful to the touch.  She has not had any fever, chest pain, nor shortness of breath, but she has had a bit of a cough.     Physical Exam   Triage Vital Signs: ED Triage Vitals  Encounter Vitals Group     BP 10/06/23 1655 (!) 153/91     Systolic BP Percentile --      Diastolic BP Percentile --      Pulse Rate 10/06/23 1655 84     Resp 10/06/23 1655 18     Temp 10/06/23 1654 98.4 F (36.9 C)     Temp src --      SpO2 10/06/23 1655 95 %     Weight 10/06/23 1655 (!) 141 kg (310 lb 13.6 oz)     Height 10/06/23 1655 1.702 m (5\' 7" )     Head Circumference --      Peak Flow --      Pain Score 10/06/23 1655 0     Pain Loc --      Pain Education --      Exclude from Growth Chart --     Most recent vital signs: Vitals:   10/06/23 1655 10/07/23 0030  BP: (!) 153/91 (!) 148/95  Pulse: 84 81  Resp: 18 16  Temp:    SpO2: 95% 98%    General: Awake, no distress.  Generally well-appearing despite chronic illness. HEENT: Minimally erythematous oropharynx, no exudate nor evidence of abscess.  Right ear canal is normal in appearance and the tympanic membrane also looks normal.  Left ear canal is slightly erythematous and the TM is slightly erythematous with a suggestion of an effusion but no evidence of acute infection or purulence. CV:  Good peripheral perfusion.  Regular rate  and rhythm, normal heart sounds. Resp:  Normal effort. Speaking easily and comfortably, no accessory muscle usage nor intercostal retractions.  Lungs are clear to auscultation bilaterally.  No cough observed during my assessment. Abd:  No distention.  Morbid obesity.  No tenderness to palpation. Other:  Extensive candidiasis in intertriginous regions beneath breasts and on lower abdomen below pannus.  No evidence of bacterial infection.  Present as chaperone for exam.   ED Results / Procedures / Treatments   Labs (all labs ordered are listed, but only abnormal results are displayed) Labs Reviewed  CBC - Abnormal; Notable for the following components:      Result Value   Hemoglobin 11.8 (*)    All other components within normal limits  BASIC METABOLIC PANEL - Abnormal; Notable for the following components:   Glucose, Bld 106 (*)    Calcium 8.8 (*)    All other components within normal limits  RESP PANEL BY RT-PCR (RSV, FLU A&B, COVID)  [id number]     RADIOLOGY I viewed and interpreted the patient's two-view chest  x-ray.  No evidence of pneumonia.  I also read the radiologist's report, which confirmed no acute findings.   PROCEDURES:  Critical Care performed: No  Procedures    IMPRESSION / MDM / ASSESSMENT AND PLAN / ED COURSE  I reviewed the triage vital signs and the nursing notes.                              Differential diagnosis includes, but is not limited to, viral illness, community-acquired pneumonia, cellulitis, candidiasis.  Patient's presentation is most consistent with acute presentation with potential threat to life or bodily function.  Labs/studies ordered: Respiratory viral panel, two-view chest x-ray, CBC, basic metabolic panel  Interventions/Medications given:  Medications - No data to display  (Note:  hospital course my include additional interventions and/or labs/studies not listed above.)   Stable vitals, reassuring lab work and physical exam.  No  indication of emergent medical condition requiring admission at this time.  I prescribed Diflucan for her rash given that she has already been on nystatin which was successful but it came right back.  I also prescribed nystatin plus triamcinolone ointment that she can mix together which should help with the pain and the inflammation.  I gave my usual recommendations for follow-up and return precautions and she agrees with the plan.         FINAL CLINICAL IMPRESSION(S) / ED DIAGNOSES   Final diagnoses:  Viral upper respiratory tract infection  Candidiasis     Rx / DC Orders   ED Discharge Orders          Ordered    fluconazole (DIFLUCAN) 150 MG tablet   Once        10/07/23 0030    nystatin ointment (MYCOSTATIN)  2 times daily        10/07/23 0030    triamcinolone ointment (KENALOG) 0.5 %  2 times daily        10/07/23 0030             Note:  This document was prepared using Dragon voice recognition software and may include unintentional dictation errors.   [name], MD 10/07/23 [phone]

## 2023-10-07 NOTE — Discharge Instructions (Addendum)
Please take the Diflucan as prescribed for your skin yeast infection.  You can also use the prescribed ointments; we recommend that you get the prescriptions for both the nystatin and triamcinolone, then mixed together equal amounts of the 2 ointments before applying to the affected regions twice a day.  Follow-up with your regular provider and with Dr. Smith Robert at the next available opportunity.  Return to the emergency department if you develop new or worsening symptoms that concern you.

## 2023-10-23 ENCOUNTER — Telehealth: Payer: Self-pay | Admitting: *Deleted

## 2023-10-23 ENCOUNTER — Other Ambulatory Visit: Payer: Self-pay

## 2023-10-23 ENCOUNTER — Other Ambulatory Visit: Payer: Self-pay | Admitting: Oncology

## 2023-10-23 DIAGNOSIS — H6692 Otitis media, unspecified, left ear: Secondary | ICD-10-CM | POA: Diagnosis not present

## 2023-10-23 DIAGNOSIS — F330 Major depressive disorder, recurrent, mild: Secondary | ICD-10-CM | POA: Diagnosis not present

## 2023-10-23 DIAGNOSIS — F411 Generalized anxiety disorder: Secondary | ICD-10-CM | POA: Diagnosis not present

## 2023-10-23 DIAGNOSIS — Z6841 Body Mass Index (BMI) 40.0 and over, adult: Secondary | ICD-10-CM | POA: Diagnosis not present

## 2023-10-23 DIAGNOSIS — E538 Deficiency of other specified B group vitamins: Secondary | ICD-10-CM | POA: Diagnosis not present

## 2023-10-23 DIAGNOSIS — M79672 Pain in left foot: Secondary | ICD-10-CM | POA: Diagnosis not present

## 2023-10-23 DIAGNOSIS — R7989 Other specified abnormal findings of blood chemistry: Secondary | ICD-10-CM | POA: Diagnosis not present

## 2023-10-23 DIAGNOSIS — C9210 Chronic myeloid leukemia, BCR/ABL-positive, not having achieved remission: Secondary | ICD-10-CM

## 2023-10-23 DIAGNOSIS — J3489 Other specified disorders of nose and nasal sinuses: Secondary | ICD-10-CM | POA: Diagnosis not present

## 2023-10-23 DIAGNOSIS — R7303 Prediabetes: Secondary | ICD-10-CM | POA: Diagnosis not present

## 2023-10-23 NOTE — Telephone Encounter (Signed)
Ok to hold the drug while on zpac

## 2023-10-23 NOTE — Telephone Encounter (Signed)
Pt dx with an ear infection and put on a zpac. She is inquiring if she needs to hold her sprycel.

## 2023-10-23 NOTE — Progress Notes (Signed)
Specialty Pharmacy Refill Coordination Note  Lurie Mullane is a 52 y.o. female contacted today regarding refills of specialty medication(s) Dasatinib   Patient requested Delivery   Delivery date: 10/31/23   Verified address: [address]   [address]   [place] [id number]   Medication will be filled on 10/30/23, pending refill request.

## 2023-10-23 NOTE — Telephone Encounter (Signed)
I spoke to pt and let her know that while she is on the zpack. So she will hold the sprycel while she is on the atb and when she finishes the atb she will start back with sprycel the next day. Pt. Ok with that info.

## 2023-10-24 ENCOUNTER — Other Ambulatory Visit: Payer: Self-pay

## 2023-10-24 MED ORDER — SPRYCEL 100 MG PO TABS
100.0000 mg | ORAL_TABLET | Freq: Every day | ORAL | 0 refills | Status: DC
  Filled 2023-10-24: qty 30, 30d supply, fill #0

## 2023-10-24 NOTE — Telephone Encounter (Signed)
Cordelia Pen, rn spoke with patient 11/5

## 2023-10-24 NOTE — Telephone Encounter (Signed)
CBC Order: [id number] Status: Final result     Visible to patient: Yes (seen)     Next appt: 11/06/2023 at 10:00 AM in Gastroenterology ([facility] PREVISIT)   0 Result Notes          Component Ref Range & Units 2 wk ago 1 mo ago 3 mo ago 4 mo ago 6 mo ago 7 mo ago 8 mo ago  WBC 4.0 - 10.5 K/uL 5.0 6.9 6.3 8.0 7.2 6.3 5.9  RBC 3.87 - 5.11 MIL/uL 4.24 4.42 4.33 4.50 4.77 4.63 4.32  Hemoglobin 12.0 - 15.0 g/dL 65.7 Low  84.6 96.2 95.2 13.2 12.8 12.1  HCT 36.0 - 46.0 % 37.7 40.1 38.7 39.8 42.5 41.8 39.5  MCV 80.0 - 100.0 fL 88.9 90.7 89.4 88.4 89.1 90.3 91.4  MCH 26.0 - 34.0 pg 27.8 27.6 27.7 27.6 27.7 27.6 28.0  MCHC 30.0 - 36.0 g/dL 84.1 32.4 40.1 02.7 25.3 30.6 30.6  RDW 11.5 - 15.5 % 14.9 14.9 15.3 15.5 14.9 14.7 14.4  Platelets 150 - 400 K/uL 273 284 295 279 318 309 284  nRBC 0.0 - 0.2 % 0.0 0.0 0.0 0.0 0.0 0.0 CM 0.0  Comment: Performed at [facility], [address] [place], [place] [address]  Neutrophils Relative %  40 R 45 R 39 R 35 R  35 R  Basophils Absolute  0.0 R 0.0 R 0.1 R 0.1 R  0.0 R  Immature Granulocytes  0 R 0 R 0 R 0 R  0 R  Abs Immature Granulocytes  0.01 R, CM 0.01 R, CM 0.02 R, CM 0.02 R, CM  0.02 R, CM  Neutro Abs  2.7 R 2.8 R 3.2 R 2.5 R  2.1 R  Lymphocytes Relative  47 R 40 R 47 R 52 R  50 R  Lymphs Abs  3.2 R 2.5 R 3.8 R 3.7 R  2.9 R  Monocytes Relative  10 R 11 R 10 R 10 R  10 R  Monocytes Absolute  0.7 R 0.7 R 0.8 R 0.7 R  0.6 R  Eosinophils Relative  3 R 3 R 3 R 2 R  4 R  Eosinophils Absolute  0.2 R 0.2 R 0.2 R 0.2 R  0.2 R  Basophils Relative  0 R 1 R 1 R 1 R  1 R  Resulting Agency [facility]         Specimen Collected: 10/06/23 16:59 Last Resulted: 10/06/23 17:13      Lab Flowsheet      Order Details      View Encounter      Lab and Collection Details      Routing      Result History    View All Conversations on this Encounter       CM=Additional comments  R=Reference range differs from displayed range      Result Care Coordination   Patient Communication   Add Comments   Seen Back to Top    Other Results from 10/06/2023  Resp panel by RT-PCR (RSV, Flu A&B, Covid) Anterior Nasal Swab Order: [id number] Status: Final result      Visible to patient: Yes (seen)      Next appt: 11/06/2023 at 10:00 AM in Gastroenterology ([facility] PREVISIT)    Specimen Information: Anterior Nasal Swab  0 Result Notes  Component Ref Range & Units 2 wk ago 3 mo ago 1 yr ago  SARS Coronavirus 2 by RT PCR NEGATIVE NEGATIVE NEGATIVE CM NEGATIVE CM  Comment: (NOTE) SARS-CoV-2 target nucleic acids are NOT DETECTED.  The SARS-CoV-2 RNA is generally detectable in upper respiratory specimens during the acute phase of infection. The lowest concentration of SARS-CoV-2 viral copies this assay can detect is 138 copies/mL. A negative result does not preclude SARS-Cov-2 infection and should not be used as the sole basis for treatment or other patient management decisions. A negative result may occur with improper specimen collection/handling, submission of specimen other than nasopharyngeal swab, presence of viral mutation(s) within the areas targeted by this assay, and inadequate number of viral copies(<138 copies/mL). A negative result must be combined with clinical observations, patient history, and epidemiological information. The expected result is Negative.  Fact Sheet for Patients: BloggerCourse.com  Fact Sheet for Healthcare Providers: SeriousBroker.it  This test is not yet approved or cleared by the [place] FDA and has been authorized for detection and/or diagnosis of SARS-CoV-2 by FDA under an Emergency Use Authorization (EUA). This EUA will remain in effect (meaning this test can be used) for the duration of the COVID-19 declaration under Section 564(b)(1)  of the Act, 21 U.S.C.section 360bbb-3(b)(1), unless the authorization is terminated or revoked sooner.    Influenza A by PCR NEGATIVE NEGATIVE NEGATIVE NEGATIVE  Influenza B by PCR NEGATIVE NEGATIVE NEGATIVE CM NEGATIVE CM  Comment: (NOTE) The Xpert Xpress SARS-CoV-2/FLU/RSV plus assay is intended as an aid in the diagnosis of influenza from Nasopharyngeal swab specimens and should not be used as a sole basis for treatment. Nasal washings and aspirates are unacceptable for Xpert Xpress SARS-CoV-2/FLU/RSV testing.  Fact Sheet for Patients: BloggerCourse.com  Fact Sheet for Healthcare Providers: SeriousBroker.it  This test is not yet approved or cleared by the [place] FDA and has been authorized for detection and/or diagnosis of SARS-CoV-2 by FDA under an Emergency Use Authorization (EUA). This EUA will remain in effect (meaning this test can be used) for the duration of the COVID-19 declaration under Section 564(b)(1) of the Act, 21 U.S.C. section 360bbb-3(b)(1), unless the authorization is terminated or revoked.   Resp Syncytial Virus by PCR NEGATIVE NEGATIVE NEGATIVE CM   Comment: (NOTE) Fact Sheet for Patients: BloggerCourse.com  Fact Sheet for Healthcare Providers: SeriousBroker.it  This test is not yet approved or cleared by the [place] FDA and has been authorized for detection and/or diagnosis of SARS-CoV-2 by FDA under an Emergency Use Authorization (EUA). This EUA will remain in effect (meaning this test can be used) for the duration of the COVID-19 declaration under Section 564(b)(1) of the Act, 21 U.S.C. section 360bbb-3(b)(1), unless the authorization is terminated or revoked.  Performed at [facility], [address] [place], [place] [address]  Resulting Agency [facility] CLIN LAB [facility] CLIN LAB [facility] CL[facility] LAB         Specimen Collected: 10/07/23  00:01 Last Resulted: 10/07/23 00:46      Lab Flowsheet       Order Details       View Encounter       Lab and Collection Details       Routing       Result History     View All Conversations on this Encounter      CM=Additional comments      Result Care Coordination   Patient Communication   Add Comments   Seen Back to Top  Contains abnormal data Basic metabolic panel Order: [id number] Status: Final result      Visible to patient: Yes (seen)      Next appt: 11/06/2023 at 10:00 AM in Gastroenterology ([facility] PREVISIT)    0 Result Notes            Component Ref Range & Units 2 wk ago 1 mo ago 3 mo ago 4 mo ago 6 mo ago 7 mo ago 8 mo ago  Sodium 135 - 145 mmol/L 142 137 137 139 138 140 138  Potassium 3.5 - 5.1 mmol/L 3.9 3.9 3.5 3.8 3.6 3.9 4.3  Chloride 98 - 111 mmol/L 108 106 102 102 102 108 101  CO2 22 - 32 mmol/L 27 27 28 29 29 26 28   Glucose, Bld 70 - 99 mg/dL 782 High  956 High  CM 112 High  CM 126 High  CM 115 High  CM 107 High  CM 103 High  CM  Comment: Glucose reference range applies only to samples taken after fasting for at least 8 hours.  BUN 6 - 20 mg/dL 7 9 10 12 11 12 12   Creatinine, Ser 0.44 - 1.00 mg/dL 2.13 0.86 5.78 4.69 6.29 0.78 0.88  Calcium 8.9 - 10.3 mg/dL 8.8 Low  8.7 Low  8.7 Low  9.0 8.9 8.7 Low  9.2  GFR, Estimated >60 mL/min >60  >60 CM >60 CM >60 CM >60 CM >60 CM  Comment: (NOTE) Calculated using the CKD-EPI Creatinine Equation (2021)  Anion gap 5 - 15 7 4  Low  CM 7 CM 8 CM 7 CM 6 CM 9 CM  Comment: Performed at [facility], [address] [place], [place] [address]  Resulting Agency [facility] CLIN LAB CH CLIN LAB CH CLIN LAB CH CLIN LAB CH CLIN LAB CH CLIN LAB CH CLIN LAB         Specimen Collected: 10/06/23 16:59 Last Resulted: 10/06/23 17:31

## 2023-10-27 DIAGNOSIS — R21 Rash and other nonspecific skin eruption: Secondary | ICD-10-CM | POA: Diagnosis not present

## 2023-10-30 DIAGNOSIS — F4312 Post-traumatic stress disorder, chronic: Secondary | ICD-10-CM | POA: Diagnosis not present

## 2023-11-01 ENCOUNTER — Other Ambulatory Visit: Payer: Self-pay

## 2023-11-11 DIAGNOSIS — F4312 Post-traumatic stress disorder, chronic: Secondary | ICD-10-CM | POA: Diagnosis not present

## 2023-11-20 ENCOUNTER — Encounter: Payer: Self-pay | Admitting: Oncology

## 2023-11-21 ENCOUNTER — Encounter: Payer: Self-pay | Admitting: Gastroenterology

## 2023-11-22 DIAGNOSIS — F4312 Post-traumatic stress disorder, chronic: Secondary | ICD-10-CM | POA: Diagnosis not present

## 2023-11-23 ENCOUNTER — Other Ambulatory Visit: Payer: Self-pay

## 2023-11-23 ENCOUNTER — Other Ambulatory Visit: Payer: Self-pay | Admitting: Oncology

## 2023-11-23 DIAGNOSIS — C9210 Chronic myeloid leukemia, BCR/ABL-positive, not having achieved remission: Secondary | ICD-10-CM

## 2023-11-23 NOTE — Telephone Encounter (Signed)
CBC Order: [id number] Status: Final result     Visible to patient: Yes (seen)     Next appt: 12/14/2023 at 08:30 AM in Gastroenterology ([facility] Previsit)   0 Result Notes          Component Ref Range & Units 1 mo ago 2 mo ago 4 mo ago 5 mo ago 7 mo ago 8 mo ago 9 mo ago  WBC 4.0 - 10.5 K/uL 5.0 6.9 6.3 8.0 7.2 6.3 5.9  RBC 3.87 - 5.11 MIL/uL 4.24 4.42 4.33 4.50 4.77 4.63 4.32  Hemoglobin 12.0 - 15.0 g/dL 95.1 Low  88.4 16.6 06.3 13.2 12.8 12.1  HCT 36.0 - 46.0 % 37.7 40.1 38.7 39.8 42.5 41.8 39.5  MCV 80.0 - 100.0 fL 88.9 90.7 89.4 88.4 89.1 90.3 91.4  MCH 26.0 - 34.0 pg 27.8 27.6 27.7 27.6 27.7 27.6 28.0  MCHC 30.0 - 36.0 g/dL 01.6 01.0 93.2 35.5 73.2 30.6 30.6  RDW 11.5 - 15.5 % 14.9 14.9 15.3 15.5 14.9 14.7 14.4  Platelets 150 - 400 K/uL 273 284 295 279 318 309 284  nRBC 0.0 - 0.2 % 0.0 0.0 0.0 0.0 0.0 0.0 CM 0.0  Comment: Performed at [facility], [address] [place], [place] [address]  Neutrophils Relative %  40 R 45 R 39 R 35 R  35 R  Basophils Absolute  0.0 R 0.0 R 0.1 R 0.1 R  0.0 R  Immature Granulocytes  0 R 0 R 0 R 0 R  0 R  Abs Immature Granulocytes  0.01 R, CM 0.01 R, CM 0.02 R, CM 0.02 R, CM  0.02 R, CM  Neutro Abs  2.7 R 2.8 R 3.2 R 2.5 R  2.1 R  Lymphocytes Relative  47 R 40 R 47 R 52 R  50 R  Lymphs Abs  3.2 R 2.5 R 3.8 R 3.7 R  2.9 R  Monocytes Relative  10 R 11 R 10 R 10 R  10 R  Monocytes Absolute  0.7 R 0.7 R 0.8 R 0.7 R  0.6 R  Eosinophils Relative  3 R 3 R 3 R 2 R  4 R  Eosinophils Absolute  0.2 R 0.2 R 0.2 R 0.2 R  0.2 R  Basophils Relative  0 R 1 R 1 R 1 R  1 R  Resulting Agency CH CLIN LAB CH CLIN LAB CH CLIN LAB CH CLIN LAB CH CLIN LAB CH CLIN LAB CH CLIN LAB         Specimen Collected: 10/06/23 16:59 Last Resulted: 10/06/23 17:13    Basic Metabolic Panel Order: [id number] Component Ref Range & Units 1 mo ago  Sodium 136 - 145 mmol/L 142  Potassium 3.5 - 5.1 mmol/L 4.1  Comment: NO VISIBLE HEMOLYSIS   Chloride 98 - 107 mmol/L 103  CO2 21 - 31 mmol/L 32 High   Anion Gap 6 - 14 mmol/L 7  Glucose, Random 70 - 99 mg/dL 87  Blood Urea Nitrogen (BUN) 7 - 25 mg/dL 9  Creatinine 8.31 - 5.17 mg/dL 6.16  eGFR >07 PX/TGG/2.69S8 >90  Comment: GFR estimated by CKD-EPI equations(NKF 2021).  "Recommend confirmation of Cr-based eGFR by using Cys-based eGFR and other filtration markers (if applicable) in complex cases and clinical decision-making, as needed."  Calcium 8.6 - 10.3 mg/dL 8.7  BUN/Creatinine Ratio   Comment: Creatinine is normal, ratio is not clinically indicated.  Resulting Agency [facility] [place] [facility](CLIA# [id number])  Specimen Collected: 10/23/23 15:05   Performed by: [facility](CLIA# [id number]) Last Resulted: 10/23/23 19:49  Received From: [facility]  Result Received: 10/26/23 12:18

## 2023-11-23 NOTE — Progress Notes (Signed)
Specialty Pharmacy Ongoing Clinical Assessment Note  Carol Ortega is a 52 y.o. female who is being followed by the specialty pharmacy service for RxSp Oncology   Patient's specialty medication(s) reviewed today: Dasatinib   Missed doses in the last 4 weeks: 7 (Pt held dose while taking antibiotic)   Patient/Caregiver did not have any additional questions or concerns.   Therapeutic benefit summary: Unable to assess   Adverse events/side effects summary: No adverse events/side effects   Patient's therapy is appropriate to: Continue    Goals Addressed             This Visit's Progress    Stabilization of disease       Patient is on track. Patient will maintain adherence         Follow up:  6 months  Bobette Mo Specialty Pharmacist

## 2023-11-23 NOTE — Progress Notes (Signed)
Specialty Pharmacy Refill Coordination Note  Carol Ortega is a 52 y.o. female contacted today regarding refills of specialty medication(s) Dasatinib   Patient requested Delivery   Delivery date: 11/29/23   Verified address: [address]  [phone]   Medication will be filled on 11/28/23. Pending Refill Request.

## 2023-11-25 MED ORDER — SPRYCEL 100 MG PO TABS
100.0000 mg | ORAL_TABLET | Freq: Every day | ORAL | 0 refills | Status: DC
  Filled 2023-11-26: qty 30, 30d supply, fill #0

## 2023-11-26 ENCOUNTER — Other Ambulatory Visit: Payer: Self-pay

## 2023-11-26 ENCOUNTER — Other Ambulatory Visit (HOSPITAL_COMMUNITY): Payer: Self-pay

## 2023-11-28 ENCOUNTER — Other Ambulatory Visit: Payer: Self-pay

## 2023-11-29 ENCOUNTER — Encounter: Payer: Medicaid Other | Admitting: Gastroenterology

## 2023-12-04 ENCOUNTER — Other Ambulatory Visit (HOSPITAL_COMMUNITY): Payer: Self-pay

## 2023-12-06 DIAGNOSIS — R7303 Prediabetes: Secondary | ICD-10-CM | POA: Diagnosis not present

## 2023-12-06 DIAGNOSIS — I10 Essential (primary) hypertension: Secondary | ICD-10-CM | POA: Diagnosis not present

## 2023-12-06 DIAGNOSIS — M79672 Pain in left foot: Secondary | ICD-10-CM | POA: Diagnosis not present

## 2023-12-06 DIAGNOSIS — E538 Deficiency of other specified B group vitamins: Secondary | ICD-10-CM | POA: Diagnosis not present

## 2023-12-14 ENCOUNTER — Ambulatory Visit (AMBULATORY_SURGERY_CENTER): Payer: Medicaid Other | Admitting: *Deleted

## 2023-12-14 VITALS — Ht 66.0 in | Wt 308.0 lb

## 2023-12-14 DIAGNOSIS — Z1211 Encounter for screening for malignant neoplasm of colon: Secondary | ICD-10-CM

## 2023-12-14 MED ORDER — PEG 3350-KCL-NA BICARB-NACL 420 G PO SOLR: 4000.0000 mL | Freq: Once | ORAL | 0 refills | Status: AC

## 2023-12-14 NOTE — Progress Notes (Signed)
PATIENT HAS ANAPHYLACTIC REACTIONS WITH EGGS. PATIENT HAS RECEIVED PROPOFOL IN THE PAST WITHOUT INCIDENT. NO PRIOR ISSUES WITH ANESTHESIA.  Pre visit completed over telephone.  Instructions forwarded through MyChart. No issues known to pt with past sedation with any surgeries or procedures Patient denies ever being told they had issues or difficulty with intubation  No FH of Malignant Hyperthermia Pt is not on diet pills Pt is not on  home 02  Pt is not on blood thinners  Pt denies issues with constipation  No A fib or A flutter Have any cardiac testing pending--NO Pt instructed to use Singlecare.com or GoodRx for a price reduction on prep

## 2023-12-18 ENCOUNTER — Other Ambulatory Visit: Payer: Self-pay

## 2023-12-21 ENCOUNTER — Inpatient Hospital Stay: Payer: Medicaid Other

## 2023-12-24 ENCOUNTER — Other Ambulatory Visit: Payer: Self-pay | Admitting: Oncology

## 2023-12-24 ENCOUNTER — Other Ambulatory Visit: Payer: Self-pay

## 2023-12-24 ENCOUNTER — Other Ambulatory Visit (HOSPITAL_COMMUNITY): Payer: Self-pay

## 2023-12-24 DIAGNOSIS — C9210 Chronic myeloid leukemia, BCR/ABL-positive, not having achieved remission: Secondary | ICD-10-CM

## 2023-12-24 MED ORDER — SPRYCEL 100 MG PO TABS
100.0000 mg | ORAL_TABLET | Freq: Every day | ORAL | 0 refills | Status: DC
  Filled 2023-12-24: qty 30, 30d supply, fill #0

## 2023-12-24 NOTE — Progress Notes (Signed)
 Specialty Pharmacy Refill Coordination Note  Carol Ortega is a 53 y.o. female contacted today regarding refills of specialty medication(s) Dasatinib  (Sprycel )   Patient requested Delivery   Delivery date: 12/28/23   Verified address: [address]  **[phone]   Medication will be filled on 01.09.25.   Refill request pending

## 2023-12-24 NOTE — Telephone Encounter (Signed)
 CBC Order: [id number]  Status: Final result     Next appt: 12/25/2023 at 11:00 AM in Oncology ([facility])   Test Result Released: Yes (seen)   0 Result Notes          Component Ref Range & Units (hover) 2 mo ago 3 mo ago 5 mo ago 6 mo ago 8 mo ago 9 mo ago 10 mo ago  WBC 5.0 6.9 6.3 8.0 7.2 6.3 5.9  RBC 4.24 4.42 4.33 4.50 4.77 4.63 4.32  Hemoglobin 11.8 Low  12.2 12.0 12.4 13.2 12.8 12.1  HCT 37.7 40.1 38.7 39.8 42.5 41.8 39.5  MCV 88.9 90.7 89.4 88.4 89.1 90.3 91.4  MCH 27.8 27.6 27.7 27.6 27.7 27.6 28.0  MCHC 31.3 30.4 31.0 31.2 31.1 30.6 30.6  RDW 14.9 14.9 15.3 15.5 14.9 14.7 14.4  Platelets 273 284 295 279 318 309 284  nRBC 0.0 0.0 0.0 0.0 0.0 0.0 CM 0.0  Comment: Performed at [facility], [address] [place], [place] [address]  Neutrophils Relative %  40 R 45 R 39 R 35 R  35 R  Basophils Absolute  0.0 R 0.0 R 0.1 R 0.1 R  0.0 R  Immature Granulocytes  0 R 0 R 0 R 0 R  0 R  Abs Immature Granulocytes  0.01 R, CM 0.01 R, CM 0.02 R, CM 0.02 R, CM  0.02 R, CM  Neutro Abs  2.7 R 2.8 R 3.2 R 2.5 R  2.1 R  Lymphocytes Relative  47 R 40 R 47 R 52 R  50 R  Lymphs Abs  3.2 R 2.5 R 3.8 R 3.7 R  2.9 R  Monocytes Relative  10 R 11 R 10 R 10 R  10 R  Monocytes Absolute  0.7 R 0.7 R 0.8 R 0.7 R  0.6 R  Eosinophils Relative  3 R 3 R 3 R 2 R  4 R  Eosinophils Absolute  0.2 R 0.2 R 0.2 R 0.2 R  0.2 R  Basophils Relative  0 R 1 R 1 R 1 R  1 R  Resulting Agency [facility] CLIN LAB CH CLIN LAB CH CLIN LAB CH CLIN LAB CH CLIN LAB CH CLIN LAB CH CLIN LAB        Specimen Collected: 10/06/23 16:59 Last Resulted: 10/06/23 17:13  Basic Metabolic Panel Order: [id number] Component Ref Range & Units 2 mo ago  Sodium 136 - 145 mmol/L 142  Potassium 3.5 - 5.1 mmol/L 4.1  Comment: NO VISIBLE HEMOLYSIS  Chloride 98 - 107 mmol/L 103  CO2 21 - 31 mmol/L 32 High   Anion Gap 6 - 14 mmol/L 7  Glucose, Random 70 - 99 mg/dL 87  Blood Urea Nitrogen (BUN) 7 - 25 mg/dL 9  Creatinine 9.39  - 8.79 mg/dL 0.7  eGFR >40 fO/fpw/8.26f7 >90  Comment: GFR estimated by CKD-EPI equations(NKF 2021).  Recommend confirmation of Cr-based eGFR by using Cys-based eGFR and other filtration markers (if applicable) in complex cases and clinical decision-making, as needed.  Calcium  8.6 - 10.3 mg/dL 8.7  BUN/Creatinine Ratio   Comment: Creatinine is normal, ratio is not clinically indicated.  Resulting Agency [facility] [place] [facility](CLIA# [id number])   Specimen Collected: 10/23/23 15:05   Performed by: [facility](CLIA# [id number]) Last Resulted: 10/23/23 19:49  Received From: [facility]  Result Received: 12/14/23 07:38

## 2023-12-25 ENCOUNTER — Inpatient Hospital Stay: Payer: Medicaid Other | Attending: Oncology

## 2023-12-25 ENCOUNTER — Other Ambulatory Visit (HOSPITAL_COMMUNITY): Payer: Self-pay

## 2023-12-25 DIAGNOSIS — C9210 Chronic myeloid leukemia, BCR/ABL-positive, not having achieved remission: Secondary | ICD-10-CM | POA: Insufficient documentation

## 2023-12-25 DIAGNOSIS — Z79899 Other long term (current) drug therapy: Secondary | ICD-10-CM

## 2023-12-25 LAB — COMPREHENSIVE METABOLIC PANEL
ALT: 14 U/L (ref 0–44)
AST: 15 U/L (ref 15–41)
Albumin: 3.7 g/dL (ref 3.5–5.0)
Alkaline Phosphatase: 56 U/L (ref 38–126)
Anion gap: 10 (ref 5–15)
BUN: 13 mg/dL (ref 6–20)
CO2: 26 mmol/L (ref 22–32)
Calcium: 9 mg/dL (ref 8.9–10.3)
Chloride: 104 mmol/L (ref 98–111)
Creatinine, Ser: 0.65 mg/dL (ref 0.44–1.00)
GFR, Estimated: >60 mL/min (ref >60)
Glucose, Bld: 102 mg/dL — ABNORMAL HIGH (ref 70–99)
Potassium: 4.3 mmol/L (ref 3.5–5.1)
Sodium: 140 mmol/L (ref 135–145)
Total Bilirubin: 0.6 mg/dL (ref 0.0–1.2)
Total Protein: 7.2 g/dL (ref 6.5–8.1)

## 2023-12-25 LAB — CBC WITH DIFFERENTIAL/PLATELET
Abs Immature Granulocytes: 0.02 10*3/uL (ref 0.00–0.07)
Basophils Absolute: 0 10*3/uL (ref 0.0–0.1)
Basophils Relative: 1 %
Eosinophils Absolute: 0.2 10*3/uL (ref 0.0–0.5)
Eosinophils Relative: 4 %
HCT: 40.9 % (ref 36.0–46.0)
Hemoglobin: 12.8 g/dL (ref 12.0–15.0)
Immature Granulocytes: 0 %
Lymphocytes Relative: 41 %
Lymphs Abs: 2.6 10*3/uL (ref 0.7–4.0)
MCH: 28.2 pg (ref 26.0–34.0)
MCHC: 31.3 g/dL (ref 30.0–36.0)
MCV: 90.1 fL (ref 80.0–100.0)
Monocytes Absolute: 0.7 10*3/uL (ref 0.1–1.0)
Monocytes Relative: 12 %
Neutro Abs: 2.6 10*3/uL (ref 1.7–7.7)
Neutrophils Relative %: 42 %
Platelets: 297 10*3/uL (ref 150–400)
RBC: 4.54 MIL/uL (ref 3.87–5.11)
RDW: 14.9 % (ref 11.5–15.5)
WBC: 6.2 10*3/uL (ref 4.0–10.5)
nRBC: 0 % (ref 0.0–0.2)

## 2023-12-27 ENCOUNTER — Encounter: Payer: Medicaid Other | Admitting: Gastroenterology

## 2024-01-01 DIAGNOSIS — F4312 Post-traumatic stress disorder, chronic: Secondary | ICD-10-CM | POA: Diagnosis not present

## 2024-01-02 ENCOUNTER — Telehealth: Payer: Self-pay | Admitting: *Deleted

## 2024-01-02 ENCOUNTER — Encounter: Payer: Self-pay | Admitting: *Deleted

## 2024-01-02 LAB — BCR-ABL1, CML/ALL, PCR, QUANT
E1A2 Transcript: <0.0032 %
b2a2 transcript: 2.7404 %
b3a2 transcript: <0.0032 %

## 2024-01-02 NOTE — Telephone Encounter (Signed)
 We will discuss next steps at the time of her appointment. Megan if you can get her to see me next week I can do that as well

## 2024-01-02 NOTE — Telephone Encounter (Signed)
 Patient called asking for doctor or her nurse to call her back to go over her test results. Her next appointment is 01/14/24  CBC with Differential/Platelet Order: [id number]  Status: Final result     Next appt: 01/10/2024 at 01:30 PM in Podiatry Velma Ghazi, DPM)     Dx: CML (chronic myelocytic leukemia) (HC...   Test Result Released: Yes (seen)   0 Result Notes          Component Ref Range & Units (hover) 8 d ago 2 mo ago 3 mo ago 5 mo ago 7 mo ago 8 mo ago 9 mo ago  WBC 6.2 5.0 6.9 6.3 8.0 7.2 6.3  RBC 4.54 4.24 4.42 4.33 4.50 4.77 4.63  Hemoglobin 12.8 11.8 Low  12.2 12.0 12.4 13.2 12.8  HCT 40.9 37.7 40.1 38.7 39.8 42.5 41.8  MCV 90.1 88.9 90.7 89.4 88.4 89.1 90.3  MCH 28.2 27.8 27.6 27.7 27.6 27.7 27.6  MCHC 31.3 31.3 30.4 31.0 31.2 31.1 30.6  RDW 14.9 14.9 14.9 15.3 15.5 14.9 14.7  Platelets 297 273 284 295 279 318 309  nRBC 0.0 0.0 CM 0.0 0.0 0.0 0.0 0.0 CM  Neutrophils Relative % 42  40 45 39 35   Neutro Abs 2.6  2.7 2.8 3.2 2.5   Lymphocytes Relative 41  47 40 47 52   Lymphs Abs 2.6  3.2 2.5 3.8 3.7   Monocytes Relative 12  10 11 10 10    Monocytes Absolute 0.7  0.7 0.7 0.8 0.7   Eosinophils Relative 4  3 3 3 2    Eosinophils Absolute 0.2  0.2 0.2 0.2 0.2   Basophils Relative 1  0 1 1 1    Basophils Absolute 0.0  0.0 0.0 0.1 0.1   Immature Granulocytes 0  0 0 0 0   Abs Immature Granulocytes 0.02  0.01 CM 0.01 CM 0.02 CM 0.02 CM   Comment: Performed at [facility], [address] [place], [place] [address]  Resulting Agency [facility] CLIN LAB CH CLIN LAB CH CLIN LAB CH CLIN LAB CH CLIN LAB CH CLIN LAB CH CLIN LAB        Specimen Collected: 12/25/23 10:00 Last Resulted: 12/25/23 10:11      Lab Flowsheet      Order Details      View Encounter      Lab and Collection Details      Routing      Result History    View All Conversations on this Encounter    CM=Additional comments    Result Care Coordination   Patient Communication   Add Comments   Seen  Back to Top   Other Results from 12/25/2023   Contains abnormal data BCR-ABL1, CML/ALL, PCR, QUANT Order: [id number]  Status: Edited Result - FINAL     Next appt: 01/10/2024 at 01:30 PM in Podiatry Velma Ghazi, DPM)     Dx: CML (chronic myelocytic leukemia) (HC...     Test Result Released: Yes (seen)   0 Result Notes          Component Ref Range & Units (hover) 8 d ago 3 mo ago 7 mo ago 8 mo ago 9 mo ago 10 mo ago 1 yr ago  b2a2 transcript 2.7404 0.5522 0.3617 0.1092 1.1297 1.0713 0.2431  b3a2 transcript <0.0032 % <0.0032 % <0.0032 % Comment CM Comment CM Comment CM Comment CM  E1A2 Transcript <0.0032 % <0.0032 % <0.0032 % Comment CM Comment CM Comment CM Comment  CM  Interpretation [facility]): ABL1 e13a2 (b2a2, p210) fusion transcript. Abnormal  ABL1 e13a2 (b2a2, p210) fusion transcript. Abnormal  CM ABL1 e13a2 (b2a2, p210) fusion transcript. Abnormal  CM ABL1 e13a2 (b2a2, p210) fusion transcript. Abnormal  CM ABL1 e13a2 (b2a2, p210) fusion transcript. Abnormal  CM ABL1 e13a2 (b2a2, p210) fusion transcript. Abnormal  CM ABL1 e13a2 (b2a2, p210) fusion transcript. Abnormal  CM  Comment: Positive POSITIVE for the BCR  Director Review [facility]): Comment Comment CM Comment CM Comment CM Comment CM Comment CM Comment CM  Comment: (NOTE) Brendolyn Callas, PhD, [facility]    Director, Molecular Oncology    [facility]    Research [place], [place] [address]    [phone]  Background: Comment VC Comment VC, CM Comment VC, CM Comment VC, CM Comment VC, CM Comment VC, CM Comment VC, CM  Comment: (NOTE) This assay can detect three different types of BCR-ABL1 fusion transcripts associated with CML, ALL, and AML: e13a2 (previously b2a2) and e14a2 (previously b3a2) (major breakpoint, p210), as well as e1a2 (minor breakpoint, p190). The e13a2 and e14a2 transcript values are titrated to the current International Scale (IS). The standardized baseline is 100% BCR-ABL1 (IS) and  major molecular response (MMR) is equivalent to 0.1% BCR-ABL1 (IS) corresponding to a 3-log reduction. Results should be correlated with appropriate clinical and laboratory information as indicated.  Methodology Comment VC Comment VC, CM Comment VC, CM Comment VC, CM Comment VC, CM Comment VC, CM Comment VC, CM  Comment: (NOTE) Total RNA is isolated from the sample and subject to a real-time, reverse transcriptase polymerase chain reaction (RT-PCR). The PCR primers and probes are specific for BCR-ABL1 e13a2, e14a2 and e1a2 fusion transcripts. The ABL1 transcript is amplified as the control for cDNA quantity and quality. Serial dilutions of a validated positive control RNA with known t(9;22) BCR-ABL1 are used as reference for quantification of BCR-ABL1 relative to ABL1. The numeric BCR-ABL1 level is reported as % BCR-ABL1/ABL1 and the detection sensitivity is 4.5 log below the standard baseline (<0.0032%). This test was developed and its performance characteristics determined by [facility]. It has not been cleared or approved by the Food and Drug Administration. Performed At: [employer] [facility] [address] [place], [place] [id number] Adams Adams MDPhD VO:[id number] Performed At: [facility] [address] [place], [place] [id number] Adams Adams MDPhD FI:[id number]  Resulting Agency [facility] CLIN LAB CH CLIN LAB CH CLIN LAB CH CLIN LAB CH CLIN LAB CH CLIN LAB [facility] CLIN LAB        Specimen Collected: 12/25/23 10:00 Last Resulted: 01/02/24 03:35      Lab Flowsheet      Order Details      View Encounter      Lab and Collection Details      Routing      Result History    View All Conversations on this Encounter    VC=Value has a corrected status  CM=Additional comments    Result Care Coordination   Patient Communication   Add Comments   Seen Back to Top      Contains abnormal data Comprehensive metabolic panel Order: [id number]  Status: Final result     Next appt:  01/10/2024 at 01:30 PM in Podiatry Velma Ghazi, DPM)     Dx: CML (chronic myelocytic leukemia) (HC...     Test Result Released: Yes (seen)   0 Result Notes          Component Ref Range &  Units (hover) 8 d ago 2 mo ago 3 mo ago 5 mo ago 7 mo ago 8 mo ago 9 mo ago  Sodium 140 142 137 137 139 138 140  Potassium 4.3 3.9 3.9 3.5 3.8 3.6 3.9  Chloride 104 108 106 102 102 102 108  CO2 26 27 27 28 29 29 26   Glucose, Bld 102 High  106 High  CM 103 High  CM 112 High  CM 126 High  CM 115 High  CM 107 High  CM  Comment: Glucose reference range applies only to samples taken after fasting for at least 8 hours.  BUN 13 7 9 10 12 11 12   Creatinine, Ser 0.65 0.74 0.93 0.71 0.78 0.83 0.78  Calcium  9.0 8.8 Low  8.7 Low  8.7 Low  9.0 8.9 8.7 Low   Total Protein 7.2  7.3 7.8 8.0    Albumin 3.7  3.4 Low  3.7 3.8    AST 15  15 19 17     ALT 14  13 15 15     Alkaline Phosphatase 56  60 63 67    Total Bilirubin 0.6  0.3 R 0.3 R 0.4 R    GFR, Estimated >60 >60 CM  >60 CM >60 CM >60 CM >60 CM  Comment: (NOTE) Calculated using the CKD-EPI Creatinine Equation (2021)  Anion gap 10 7 CM 4 Low  CM 7 CM 8 CM 7 CM 6 CM  Comment: Performed at [facility], [address] [place], [place] [address]  Resulting Agency [facility] CLIN LAB CH CLIN LAB CH CLIN LAB CH CLIN LAB CH CLIN LAB CH CLIN LAB CH CLIN LAB        Specimen Collected: 12/25/23 10:00 Last Resulted: 12/25/23 10:24

## 2024-01-03 ENCOUNTER — Ambulatory Visit: Payer: Medicaid Other | Admitting: Podiatry

## 2024-01-09 ENCOUNTER — Other Ambulatory Visit: Payer: Self-pay

## 2024-01-09 ENCOUNTER — Telehealth: Payer: Self-pay

## 2024-01-09 ENCOUNTER — Inpatient Hospital Stay: Payer: Medicaid Other | Admitting: Oncology

## 2024-01-09 DIAGNOSIS — F4312 Post-traumatic stress disorder, chronic: Secondary | ICD-10-CM | POA: Diagnosis not present

## 2024-01-09 DIAGNOSIS — C9210 Chronic myeloid leukemia, BCR/ABL-positive, not having achieved remission: Secondary | ICD-10-CM

## 2024-01-09 DIAGNOSIS — Z79899 Other long term (current) drug therapy: Secondary | ICD-10-CM | POA: Diagnosis not present

## 2024-01-09 NOTE — Telephone Encounter (Signed)
Called patient to help her get set up for a virtual visit with Dr. Smith Robert as requested by Nehemiah Settle. Patient verbally confirmed that the link was sent to her phone and that she is on video waiting for Dr. Smith Robert to join.

## 2024-01-10 ENCOUNTER — Encounter: Payer: Self-pay | Admitting: Oncology

## 2024-01-10 ENCOUNTER — Encounter: Payer: Self-pay | Admitting: Podiatry

## 2024-01-10 ENCOUNTER — Inpatient Hospital Stay: Payer: Medicaid Other

## 2024-01-10 ENCOUNTER — Ambulatory Visit: Payer: Medicaid Other | Admitting: Podiatry

## 2024-01-10 DIAGNOSIS — M7752 Other enthesopathy of left foot: Secondary | ICD-10-CM

## 2024-01-10 NOTE — Progress Notes (Signed)
I connected with Carol Ortega on 01/09/2024 at 10:00 AM EST by video enabled telemedicine visit and verified that I am speaking with the correct person using two identifiers.   I discussed the limitations, risks, security and privacy concerns of performing an evaluation and management service by telemedicine and the availability of in-person appointments. I also discussed with the patient that there may be a patient responsible charge related to this service. The patient expressed understanding and agreed to proceed.  Other persons participating in the visit and their role in the encounter:  none  Patient's location:  home Provider's location:  home  Chief Complaint:  discuss further management of CML  History of present illness: Patient is a 53 year old African-American femaleWho presents to the ER with symptoms of left-sided flank pain.  CT abdomen and pelvis without contrast showed moderate left hydronephrosis secondary to a 1.1 x 0.9 x 1.3 cm calculus at the left ureteropelvic junction/proximal left ureter.  No other evidence of adenopathy and spleen appeared normal.  Incidentally patient was noted to have a white cell count of 138 which on repeat check was 133.  Platelet counts normal and hemoglobin was 12.1.  Last normal CBC was in April 2021 when white count was 7.2.  Presently differential mainly shows neutrophilia along with lymphocytosis monocytosis basophilia and eosinophilia.  Immature granulocytes were seen.  Pathology smear review shows findings concerning for myeloproliferative neoplasm.  Blasts possibly less than 20% and BCR ABL testing was recommended.Patient noted to have wbc of 18 in may 2022 again with predominant left shift.   BCR ABL FISH testing showed 100% nuclei positive for BCR-ABL gene fusion signals.  BCR ABL PCR testing showed B2 A2 transcript 847.313 and B3 A2 transcript less than 0.0032%.  E1 A2 transcript 0.5645%.  Peripheral blood flow cytometry showedAberrant  myeloblast population about 2% of the leukocytes.  Absolute neutrophilia eosinophilia and basophilia.  Patient started Dasatinib in early December 2022. Bone marrow biopsy on 01/25/2021 showed hypercellular bone marrow with pan myeloid proliferation.  No increased number of blasts seen.  There was a concern for increasing BCR-ABL transcripts in April 2024.  Mutational analysis showedp.([id number]) of uncertain significance.Repeat bone marrow biopsy did not reveal any evidence of progression.  Orderly maturation noted in erythroid and granulocytic precursors.  Megakaryocytes slightly increased in number with focal clustering and slight hyper lobulation.  Subsequently BCR-ABL transcripts decreased to less than 1% and therefore patient was continued on Dasatinib.    Interval history patient recovered from repeated bouts of vaginal yeast infections as well as an upper respiratory infection recently.  She reports being compliant with Sprycel and has not missed any doses.   Review of Systems  Constitutional:  Negative for chills, fever, malaise/fatigue and weight loss.  HENT:  Negative for congestion, ear discharge and nosebleeds.   Eyes:  Negative for blurred vision.  Respiratory:  Negative for cough, hemoptysis, sputum production, shortness of breath and wheezing.   Cardiovascular:  Negative for chest pain, palpitations, orthopnea and claudication.  Gastrointestinal:  Negative for abdominal pain, blood in stool, constipation, diarrhea, heartburn, melena, nausea and vomiting.  Genitourinary:  Negative for dysuria, flank pain, frequency, hematuria and urgency.  Musculoskeletal:  Negative for back pain, joint pain and myalgias.  Skin:  Negative for rash.  Neurological:  Negative for dizziness, tingling, focal weakness, seizures, weakness and headaches.  Endo/Heme/Allergies:  Does not bruise/bleed easily.  Psychiatric/Behavioral:  Negative for depression and suicidal ideas. The patient does not have  insomnia.  Allergies  Allergen Reactions   Bee Venom Anaphylaxis   Egg-Derived Products Anaphylaxis   Other Anaphylaxis    Surgical dye.. Pt states ok to take if previously taken benadryl or prednisone   Penicillins Hives and Other (See Comments)    Has patient had a PCN reaction causing immediate rash, facial/tongue/throat swelling, SOB or lightheadedness with hypotension: No Has patient had a PCN reaction causing severe rash involving mucus membranes or skin necrosis: No Has patient had a PCN reaction that required hospitalization No Has patient had a PCN reaction occurring within the last 10 years: No If all of the above answers are "NO", then may proceed with Cephalosporin use.   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   Complication of anesthesia 1999   lung collapse after surgery for gallbladder   Depression    Ectopic pregnancy    Headache    migraines   Hoarseness of voice    Hypertension    Kidney stone    Obesity    Parathyroid abnormality (HCC)    Pulmonary emboli (HCC) 2011   Pulmonary embolism (HCC)    Radiation    Brain tumor   Thyroid disease    UTI (urinary tract infection)    Vitamin D deficiency     Past Surgical History:  Procedure Laterality Date   CHOLECYSTECTOMY  1999   CYSTOSCOPY W/ RETROGRADES Left 03/01/2018   Procedure: CYSTOSCOPY WITH RETROGRADE PYELOGRAM;  Surgeon: Riki Altes, MD;  Location: [facility];  Service: Urology;  Laterality: Left;   CYSTOSCOPY W/ URETERAL STENT PLACEMENT Right 04/08/2017   Procedure: CYSTOSCOPY WITH RETROGRADE PYELOGRAM/URETERAL STENT PLACEMENT;  Surgeon: Barron Alvine, MD;  Location: [facility];  Service: Urology;  Laterality: Right;    CYSTOSCOPY W/ URETERAL STENT PLACEMENT Left 03/12/2018   Procedure: CYSTOSCOPY WITH STENT REPLACEMENT;  Surgeon: Riki Altes, MD;  Location: [facility];  Service: Urology;  Laterality: Left;   CYSTOSCOPY W/ URETEROSCOPY     CYSTOSCOPY WITH RETROGRADE PYELOGRAM, URETEROSCOPY AND STENT PLACEMENT Left 01/20/2014   Procedure: LEFT URETEROSCOPY WITH STENT PLACEMENT;  Surgeon: Bjorn Pippin, MD;  Location: [facility];  Service: Urology;  Laterality: Left;   CYSTOSCOPY WITH RETROGRADE PYELOGRAM, URETEROSCOPY AND STENT PLACEMENT Bilateral 04/23/2015   Procedure: CYSTOSCOPY WITH BILATERAL RETROGRADE PYELOGRAM, URETEROSCOPY AND STENT PLACEMENT;  Surgeon: Sebastian Ache, MD;  Location: [facility];  Service: Urology;  Laterality: Bilateral;   CYSTOSCOPY WITH RETROGRADE PYELOGRAM, URETEROSCOPY AND STENT PLACEMENT Right 04/13/2017   Procedure: CYSTOSCOPY WITH RETROGRADE PYELOGRAM, URETEROSCOPY AND STENT EXCHANGE;  Surgeon: Sebastian Ache, MD;  Location: [facility];  Service: Urology;  Laterality: Right;   CYSTOSCOPY WITH STENT PLACEMENT Left 01/12/2014   Procedure: CYSTOSCOPY WITH STENT PLACEMENT left retrograde;  Surgeon: Bjorn Pippin, MD;  Location: [facility];  Service: Urology;  Laterality: Left;   CYSTOSCOPY WITH STENT PLACEMENT Left 03/01/2018   Procedure: CYSTOSCOPY WITH STENT PLACEMENT;  Surgeon: Riki Altes, MD;  Location: [facility];  Service: Urology;  Laterality: Left;   CYSTOSCOPY/URETEROSCOPY/HOLMIUM LASER/STENT PLACEMENT Left 03/12/2018   Procedure: CYSTOSCOPY/URETEROSCOPY/HOLMIUM LASER;  Surgeon: Riki Altes, MD;  Location: [facility];  Service: Urology;  Laterality: Left;  CYSTOSCOPY/URETEROSCOPY/HOLMIUM LASER/STENT PLACEMENT Left 11/15/2021   Procedure: CYSTOSCOPY/URETEROSCOPY/HOLMIUM LASER/STENT PLACEMENT;  Surgeon: Riki Altes, MD;  Location: [facility];  Service: Urology;  Laterality: Left;   HOLMIUM LASER APPLICATION Left 01/20/2014   Procedure: HOLMIUM LASER APPLICATION;  Surgeon: Bjorn Pippin, MD;   Location: [facility];  Service: Urology;  Laterality: Left;   HOLMIUM LASER APPLICATION Bilateral 04/23/2015   Procedure: HOLMIUM LASER APPLICATION;  Surgeon: Sebastian Ache, MD;  Location: [facility];  Service: Urology;  Laterality: Bilateral;   HOLMIUM LASER APPLICATION Right 04/13/2017   Procedure: HOLMIUM LASER APPLICATION;  Surgeon: Sebastian Ache, MD;  Location: [facility];  Service: Urology;  Laterality: Right;   IR BONE MARROW BIOPSY & ASPIRATION  03/27/2023   kidney stone removal     LITHOTRIPSY     PARATHYROIDECTOMY Right 11/13/2016   Procedure: RIGHT SUPERIOR PARATHYROIDECTOMY;  Surgeon: Darnell Level, MD;  Location: [facility];  Service: General;  Laterality: Right;   surgery for,ectopic pregnancy  2011    1 fallopian tube rupture   TUBAL LIGATION  2011    Social History   Socioeconomic History   Marital status: Divorced    Spouse name: Not on file   Number of children: 4   Years of education: Not on file   Highest education level: Some college, no degree  Occupational History   Not on file  Tobacco Use   Smoking status: Never   Smokeless tobacco: Never  Vaping Use   Vaping status: Never Used  Substance and Sexual Activity   Alcohol use: Not Currently   Drug use: Yes    Types: Marijuana   Sexual activity: Yes  Other Topics Concern   Not on file  Social History Narrative   Lives at home with her son, independent at baselinepend   Social Drivers of Health   Financial Resource Strain: Medium Risk (09/11/2018)   Overall Financial Resource Strain (CARDIA)    Difficulty of Paying Living Expenses: Somewhat hard  Food Insecurity: Low Risk  (08/16/2023)   Received from [facility]   Hunger Vital Sign    Worried About Running Out of Food in the Last Year: Never true    Ran Out of Food in the Last Year: Never true  Transportation Needs: No Transportation Needs (08/16/2023)   Received from [facility]    In the past 12 months, has lack of reliable transportation kept  you from medical appointments, meetings, work or from getting things needed for daily living? : No  Physical Activity: Inactive (09/11/2018)   Exercise Vital Sign    Days of Exercise per Week: 0 days    Minutes of Exercise per Session: 0 min  Stress: Stress Concern Present (09/11/2018)   [employer] of Occupational Health - Occupational Stress Questionnaire    Feeling of Stress : Very much  Social Connections: Moderately Isolated (09/11/2018)   Social Connection and Isolation Panel [NHANES]    Frequency of Communication with Friends and Family: More than three times a week    Frequency of Social Gatherings with Friends and Family: More than three times a week    Attends Religious Services: Never    Database administrator or Organizations: No    Attends Banker Meetings: Never    Marital Status: Divorced  Catering manager Violence: Not At Risk (09/11/2018)   Humiliation, Afraid, Rape, and Kick questionnaire    Fear of Current or Ex-Partner: No    Emotionally Abused: No    Physically Abused:  No    Sexually Abused: No    Family History  Problem Relation Age of Onset   Colon polyps Mother    Bell's palsy Mother    Heart failure Mother    Hypertension Mother    Lupus Mother    Anxiety disorder Mother    Depression Mother    Stroke Mother    Asthma Father    Hypertension Father    Hyperlipidemia Father    Anxiety disorder Father    Depression Father    Anxiety disorder Sister    Depression Sister    Bipolar disorder Sister    Hypertension Maternal Grandmother    Diabetes Maternal Grandmother    Sarcoidosis Maternal Grandmother    Depression Son    Bipolar disorder Son    Colon cancer Neg Hx      Current Outpatient Medications:    acetaminophen (TYLENOL) 325 MG tablet, Take 650 mg by mouth every 6 (six) hours as needed for mild pain., Disp: , Rfl:    albuterol (PROVENTIL) (2.5 MG/3ML) 0.083% nebulizer solution, Take 3 mLs (2.5 mg total) by nebulization  every 6 (six) hours as needed for wheezing or shortness of breath., Disp: 75 mL, Rfl: 12   alclomethasone (ACLOVATE) 0.05 % cream, Apply topically., Disp: , Rfl:    cyanocobalamin (VITAMIN B12) 1000 MCG/ML injection, Inject 1,000 mcg into the muscle once., Disp: , Rfl:    dicyclomine (BENTYL) 10 MG capsule, Take 1 capsule (10 mg total) by mouth 4 (four) times daily -  before meals and at bedtime. (Patient not taking: Reported on 12/14/2023), Disp: 90 capsule, Rfl: 1   DULoxetine (CYMBALTA) 60 MG capsule, Take 1 capsule by mouth daily. (Patient not taking: Reported on 09/21/2023), Disp: , Rfl:    EPINEPHrine 0.3 mg/0.3 mL IJ SOAJ injection, Inject into the muscle., Disp: , Rfl:    fluconazole (DIFLUCAN) 150 MG tablet, Take by mouth. (Patient not taking: Reported on 12/14/2023), Disp: , Rfl:    hydrOXYzine (ATARAX) 10 MG tablet, Take 10 mg by mouth at bedtime. (Patient not taking: Reported on 09/21/2023), Disp: , Rfl:    nystatin ointment (MYCOSTATIN), Apply 1 Application topically 2 (two) times daily. Mix together with equal amount of triamcinolone ointment and apply to affected area., Disp: 30 g, Rfl: 0   ondansetron (ZOFRAN-ODT) 4 MG disintegrating tablet, Dis 1 T on the tongue every 6 hours prn, Disp: , Rfl:    Semaglutide-Weight Management (WEGOVY) 0.25 MG/0.5ML SOAJ, Inject into the skin., Disp: , Rfl:    SPRYCEL 100 MG tablet, Take 1 tablet (100 mg total) by mouth daily., Disp: 30 tablet, Rfl: 0   triamcinolone ointment (KENALOG) 0.5 %, Apply 1 Application topically 2 (two) times daily. Mix with equal parts nystatin ointment before applying., Disp: 30 g, Rfl: 0   VENTOLIN HFA 108 (90 Base) MCG/ACT inhaler, Inhale 2 puffs into the lungs every 6 (six) hours as needed., Disp: , Rfl:    Vitamin D, Ergocalciferol, (DRISDOL) 1.25 MG (50000 UNIT) CAPS capsule, Take 1 capsule by mouth once a week., Disp: , Rfl:   No results found.  No images are attached to the encounter.      Latest Ref Rng & Units  12/25/2023   10:00 AM  CMP  Glucose 70 - 99 mg/dL 213   BUN 6 - 20 mg/dL 13   Creatinine 0.86 - 1.00 mg/dL 5.78   Sodium 469 - 629 mmol/L 140   Potassium 3.5 - 5.1 mmol/L 4.3   Chloride  98 - 111 mmol/L 104   CO2 22 - 32 mmol/L 26   Calcium 8.9 - 10.3 mg/dL 9.0   Total Protein 6.5 - 8.1 g/dL 7.2   Total Bilirubin 0.0 - 1.2 mg/dL 0.6   Alkaline Phos 38 - 126 U/L 56   AST 15 - 41 U/L 15   ALT 0 - 44 U/L 14       Latest Ref Rng & Units 12/25/2023   10:00 AM  CBC  WBC 4.0 - 10.5 K/uL 6.2   Hemoglobin 12.0 - 15.0 g/dL 10.9   Hematocrit 32.3 - 46.0 % 40.9   Platelets 150 - 400 K/uL 297      Observation/objective: Appears in no acute distress over video visit today.  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  There was a concern for rising BCR-ABL transcripts in April 2024 when patient underwent a repeat bone marrow biopsy which did not show any evidence of progression.  Mutational analysis done back in April 2024 did not show any actionable mutations.  Her BCR-ABL transcript in April 2024 was 1.1% and later went down to 0.1%.  Decision was therefore made to continue with Dasatinib.  Over the last 6 months BCR-ABL B2 A2 transcript has gradually increased from 0.1-0 0.36-0.55 and presently to 2.7%.  Patient has been compliant with Dasatinib and she is not on any medications which would reduce the efficacy of Dasatinib.  I would like to repeat her BCR-ABL mutational analysis at this time.  I am not repeating a bone marrow biopsy.  If there are no actionable mutations noted I will plan to switch her from Dasatinib to nilotinib.  She does not have any pre-existing cardiac issues.  If she does not respond to nilotinib I will consider switching her to asciminib at  that time I will refer her to [facility] for second opinion.  Patient comprehends my plan well.  For now she will stay on Dasatinib and I will reach out to her after mutational analysis results are back  Follow-up instructions: As above  I discussed the assessment and treatment plan with the patient. The patient was provided an opportunity to ask questions and all were answered. The patient agreed with the plan and demonstrated an understanding of the instructions.   The patient was advised to call back or seek an in-person evaluation if the symptoms worsen or if the condition fails to improve as anticipated.  I provided 16 minutes of face-to-face video visit time during this encounter, and > 50% was spent counseling as documented under my assessment & plan.  Visit Diagnosis: 1. CML (chronic myelocytic leukemia) (HCC)     Dr. Owens Shark, MD, MPH [facility] at [facility] Tel- [phone] 01/10/2024 10:51 AM

## 2024-01-10 NOTE — Progress Notes (Signed)
Subjective:  Patient ID: Carol Ortega, female    DOB: [birth year],  MRN: [id number]  Chief Complaint  Patient presents with   Foot Pain    "I hurt my foot in August.  I had xrays, they didn't see anything wrong.  My foot hit the door frame.  I don't know if it's nerve damage or what." (Lateral left)    53 y.o. female presents with the above complaint.  Patient presents with left fifth metatarsophalangeal joint pain hurts with ambulation is with pressure she states she hit the door frame few months ago but is still hurting her she has not seen and was prior to seeing me pain scale 7 out of 10 dull achy in nature hurts with ambulation.  She has not immobilized in the past.   Review of Systems: Negative except as noted in the HPI. Denies N/V/F/Ch.  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   Complication of anesthesia 1999   lung collapse after surgery for gallbladder   Depression    Ectopic pregnancy    Headache    migraines   Hoarseness of voice    Hypertension    Kidney stone    Obesity    Parathyroid abnormality (HCC)    Pulmonary emboli (HCC) 2011   Pulmonary embolism (HCC)    Radiation    Brain tumor   Thyroid disease    UTI (urinary tract infection)    Vitamin D deficiency     Current Outpatient Medications:    acetaminophen (TYLENOL) 325 MG tablet, Take 650 mg by mouth every 6 (six) hours as needed for mild pain., Disp: , Rfl:    albuterol (PROVENTIL) (2.5 MG/3ML) 0.083% nebulizer solution, Take 3 mLs (2.5 mg total) by nebulization every 6 (six) hours as needed for wheezing or shortness of breath., Disp: 75 mL, Rfl: 12   alclomethasone (ACLOVATE) 0.05 % cream, Apply topically., Disp: , Rfl:    cyanocobalamin (VITAMIN B12) 1000 MCG/ML injection, Inject 1,000 mcg into the muscle once., Disp: , Rfl:     dicyclomine (BENTYL) 10 MG capsule, Take 1 capsule (10 mg total) by mouth 4 (four) times daily -  before meals and at bedtime. (Patient not taking: Reported on 09/21/2023), Disp: 90 capsule, Rfl: 1   DULoxetine (CYMBALTA) 60 MG capsule, Take 1 capsule by mouth daily. (Patient not taking: Reported on 09/21/2023), Disp: , Rfl:    EPINEPHrine 0.3 mg/0.3 mL IJ SOAJ injection, Inject into the muscle., Disp: , Rfl:    fluconazole (DIFLUCAN) 150 MG tablet, Take by mouth. (Patient not taking: Reported on 01/10/2024), Disp: , Rfl:    hydrOXYzine (ATARAX) 10 MG tablet, Take 10 mg by mouth at bedtime. (Patient not taking: Reported on 09/21/2023), Disp: , Rfl:    nystatin ointment (MYCOSTATIN), Apply 1 Application topically 2 (two) times daily. Mix together with equal amount of triamcinolone ointment and apply to affected area., Disp: 30 g, Rfl: 0   ondansetron (ZOFRAN-ODT) 4 MG disintegrating tablet, Dis 1 T on the tongue every 6 hours prn, Disp: , Rfl:    Semaglutide-Weight Management (WEGOVY) 0.25 MG/0.5ML SOAJ, Inject into the skin., Disp: , Rfl:    SPRYCEL 100 MG tablet, Take 1 tablet (100 mg total) by mouth daily., Disp: 30 tablet, Rfl: 0  triamcinolone ointment (KENALOG) 0.5 %, Apply 1 Application topically 2 (two) times daily. Mix with equal parts nystatin ointment before applying., Disp: 30 g, Rfl: 0   VENTOLIN HFA 108 (90 Base) MCG/ACT inhaler, Inhale 2 puffs into the lungs every 6 (six) hours as needed., Disp: , Rfl:    Vitamin D, Ergocalciferol, (DRISDOL) 1.25 MG (50000 UNIT) CAPS capsule, Take 1 capsule by mouth once a week., Disp: , Rfl:   Social History   Tobacco Use  Smoking Status Never  Smokeless Tobacco Never    Allergies  Allergen Reactions   Bee Venom Anaphylaxis   Egg-Derived Products Anaphylaxis   Other Anaphylaxis    Surgical dye.. Pt states ok to take if previously taken benadryl or prednisone   Penicillins Hives and Other (See Comments)    Has patient had a PCN reaction causing  immediate rash, facial/tongue/throat swelling, SOB or lightheadedness with hypotension: No Has patient had a PCN reaction causing severe rash involving mucus membranes or skin necrosis: No Has patient had a PCN reaction that required hospitalization No Has patient had a PCN reaction occurring within the last 10 years: No If all of the above answers are "NO", then may proceed with Cephalosporin use.   Wellbutrin [Bupropion] Other (See Comments)    irritiability   Latex Rash   Oxycodone-Acetaminophen Itching   Objective:  There were no vitals filed for this visit. There is no height or weight on file to calculate BMI. Constitutional Well developed. Well nourished.  Vascular Dorsalis pedis pulses palpable bilaterally. Posterior tibial pulses palpable bilaterally. Capillary refill normal to all digits.  No cyanosis or clubbing noted. Pedal hair growth normal.  Neurologic Normal speech. Oriented to person, place, and time. Epicritic sensation to light touch grossly present bilaterally.  Dermatologic Nails well groomed and normal in appearance. No open wounds. No skin lesions.  Orthopedic: Pain on palpation left submetatarsal 5 left fifth metatarsophalangeal joint pain on palpation to the joint pain with range of motion of the fifth metatarsophalangeal joint no hyperkeratotic lesion noted   Radiographs: FINDINGS: There is no evidence of fracture or dislocation. There is no evidence of arthropathy or other focal bone abnormality. Soft tissues are unremarkable.   IMPRESSION: Negative.   Assessment:   1. Capsulitis of metatarsophalangeal (MTP) joint of left foot    Plan:  Patient was evaluated and treated and all questions answered.  Left fifth metatarsophalangeal joint capsulitis -All questions and concerns were discussed with the patient in extensive detail given the amount of pain that she is having she will benefit from cam boot immobilization allow the soft tissue structure to  heal appropriately.  Patient agrees with plan like to proceed with cam boot -If there is still some residual pain we will discuss steroid injection  No follow-ups on file.

## 2024-01-12 ENCOUNTER — Other Ambulatory Visit: Payer: Self-pay

## 2024-01-12 ENCOUNTER — Emergency Department: Payer: Medicaid Other

## 2024-01-12 ENCOUNTER — Emergency Department
Admission: EM | Admit: 2024-01-12 | Discharge: 2024-01-12 | Disposition: A | Discharge status: Home / Self Care | Payer: Medicaid Other | Attending: Emergency Medicine | Admitting: Emergency Medicine

## 2024-01-12 DIAGNOSIS — W2203XA Walked into furniture, initial encounter: Secondary | ICD-10-CM | POA: Diagnosis not present

## 2024-01-12 DIAGNOSIS — S66912A Strain of unspecified muscle, fascia and tendon at wrist and hand level, left hand, initial encounter: Secondary | ICD-10-CM | POA: Diagnosis not present

## 2024-01-12 DIAGNOSIS — S6992XA Unspecified injury of left wrist, hand and finger(s), initial encounter: Secondary | ICD-10-CM | POA: Diagnosis present

## 2024-01-12 DIAGNOSIS — S63502A Unspecified sprain of left wrist, initial encounter: Secondary | ICD-10-CM | POA: Diagnosis not present

## 2024-01-12 DIAGNOSIS — M25532 Pain in left wrist: Secondary | ICD-10-CM | POA: Diagnosis not present

## 2024-01-12 NOTE — ED Provider Notes (Signed)
[facility] Provider Note    Event Date/Time   First MD Initiated Contact with Patient 01/12/24 1320     (approximate)   History   Wrist Pain   HPI  Carol Ortega is a 53 y.o. female with history of chronic anemia presents emergency department complaint of left wrist pain.  Patient states she hit her left hand on the base of the bed and continues to have pain in the left wrist.  Pain is right below the thumb.  No other injuries reported.  Symptoms for several days      Physical Exam   Triage Vital Signs: ED Triage Vitals  Encounter Vitals Group     BP 01/12/24 1210 (!) 161/101     Systolic BP Percentile --      Diastolic BP Percentile --      Pulse --      Resp 01/12/24 1210 19     Temp 01/12/24 1210 98.1 F (36.7 C)     Temp Source 01/12/24 1210 Oral     SpO2 01/12/24 1210 96 %     Weight 01/12/24 1217 (!) 304 lb (137.9 kg)     Height 01/12/24 1217 5\' 6"  (1.676 m)     Head Circumference --      Peak Flow --      Pain Score 01/12/24 1217 10     Pain Loc --      Pain Education --      Exclude from Growth Chart --     Most recent vital signs: Vitals:   01/12/24 1210  BP: (!) 161/101  Resp: 19  Temp: 98.1 F (36.7 C)  SpO2: 96%     General: Awake, no distress.   CV:  Good peripheral perfusion. regular rate and  rhythm Resp:  Normal effort.  Abd:  No distention.   Other:  Left wrist tender at the snuffbox, decreased range of motion secondary discomfort, fingers, metacarpals and forearm are not tender   ED Results / Procedures / Treatments   Labs (all labs ordered are listed, but only abnormal results are displayed) Labs Reviewed - No data to display   EKG     RADIOLOGY X-ray of the left wrist    PROCEDURES:   Procedures Chief Complaint  Patient presents with   Wrist Pain      MEDICATIONS ORDERED IN ED: Medications - No data to display   IMPRESSION / MDM / ASSESSMENT AND PLAN / ED COURSE  I reviewed  the triage vital signs and the nursing notes.                              Differential diagnosis includes, but is not limited to, sprain, strain, scaphoid fracture, fracture  Patient's presentation is most consistent with acute illness / injury with system symptoms.   X-ray of the left wrist independently reviewed interpreted by me as being negative for any acute abnormality.  Patient was placed in a wrist brace thumb ad.  She is to take Tylenol due to her chronic anemia she should not take ibuprofen.  Apply ice.  Elevate the area.  Follow-up with orthopedics.  We did discuss over-the-counter measures.  Discussed follow-up with orthopedics.  She is in agreement treatment plan.  Discharged stable condition.      FINAL CLINICAL IMPRESSION(S) / ED DIAGNOSES   Final diagnoses:  Sprain and strain of left wrist  Rx / DC Orders   ED Discharge Orders     None        Note:  This document was prepared using Dragon voice recognition software and may include unintentional dictation errors.    Faythe Ghee, PA-C 01/12/24 1335    Janith Lima, MD 01/12/24 [phone]

## 2024-01-12 NOTE — ED Triage Notes (Signed)
Pt reports left wrist pain. Pt reports she hit her left hand with base of bed. Pt talks in complete sentences no distress noted.

## 2024-01-12 NOTE — ED Provider Triage Note (Signed)
Emergency Medicine Provider Triage Evaluation Note  Carol Ortega , a 53 y.o. female  was evaluated in triage.  Pt complains of left hand pain, patient has pain and swelling, positive injury.  Review of Systems  Positive:  Negative:   Physical Exam  LMP 06/18/2019  Gen:   Awake, no distress   Resp:  Normal effort  MSK:   Moves extremities without difficulty, left wrist tender in the snuffbox, no metacarpal tenderness, no forearm tenderness Other:    Medical Decision Making  Medically screening exam initiated at 12:11 PM.  Appropriate orders placed.  Carol Ortega was informed that the remainder of the evaluation will be completed by another provider, this initial triage assessment does not replace that evaluation, and the importance of remaining in the ED until their evaluation is complete.     Faythe Ghee, PA-C 01/12/24 1212

## 2024-01-12 NOTE — Discharge Instructions (Signed)
Apply ice and elevate, wear the splint as much as possible Follow up with orthopedics Take tylenol for pain as needed

## 2024-01-14 ENCOUNTER — Encounter: Payer: Self-pay | Admitting: Emergency Medicine

## 2024-01-14 ENCOUNTER — Telehealth: Payer: Self-pay | Admitting: Oncology

## 2024-01-14 ENCOUNTER — Emergency Department: Payer: Medicaid Other

## 2024-01-14 ENCOUNTER — Other Ambulatory Visit: Payer: Self-pay

## 2024-01-14 ENCOUNTER — Ambulatory Visit: Payer: Medicaid Other | Admitting: Oncology

## 2024-01-14 ENCOUNTER — Emergency Department
Admission: EM | Admit: 2024-01-14 | Discharge: 2024-01-14 | Disposition: A | Discharge status: Home / Self Care | Payer: Medicaid Other | Attending: Emergency Medicine | Admitting: Emergency Medicine

## 2024-01-14 ENCOUNTER — Inpatient Hospital Stay: Payer: Medicaid Other

## 2024-01-14 ENCOUNTER — Other Ambulatory Visit: Payer: Medicaid Other

## 2024-01-14 DIAGNOSIS — C9210 Chronic myeloid leukemia, BCR/ABL-positive, not having achieved remission: Secondary | ICD-10-CM | POA: Diagnosis not present

## 2024-01-14 DIAGNOSIS — M25532 Pain in left wrist: Secondary | ICD-10-CM | POA: Diagnosis not present

## 2024-01-14 DIAGNOSIS — S62002A Unspecified fracture of navicular [scaphoid] bone of left wrist, initial encounter for closed fracture: Secondary | ICD-10-CM | POA: Insufficient documentation

## 2024-01-14 DIAGNOSIS — S6992XA Unspecified injury of left wrist, hand and finger(s), initial encounter: Secondary | ICD-10-CM | POA: Diagnosis not present

## 2024-01-14 DIAGNOSIS — W228XXA Striking against or struck by other objects, initial encounter: Secondary | ICD-10-CM | POA: Diagnosis not present

## 2024-01-14 DIAGNOSIS — M7989 Other specified soft tissue disorders: Secondary | ICD-10-CM | POA: Diagnosis not present

## 2024-01-14 LAB — CBC WITH DIFFERENTIAL (CANCER CENTER ONLY)
Abs Immature Granulocytes: 0.01 10*3/uL (ref 0.00–0.07)
Basophils Absolute: 0 10*3/uL (ref 0.0–0.1)
Basophils Relative: 1 %
Eosinophils Absolute: 0.2 10*3/uL (ref 0.0–0.5)
Eosinophils Relative: 2 %
HCT: 40.1 % (ref 36.0–46.0)
Hemoglobin: 12.7 g/dL (ref 12.0–15.0)
Immature Granulocytes: 0 %
Lymphocytes Relative: 36 %
Lymphs Abs: 2.3 10*3/uL (ref 0.7–4.0)
MCH: 28.2 pg (ref 26.0–34.0)
MCHC: 31.7 g/dL (ref 30.0–36.0)
MCV: 88.9 fL (ref 80.0–100.0)
Monocytes Absolute: 0.8 10*3/uL (ref 0.1–1.0)
Monocytes Relative: 12 %
Neutro Abs: 3.1 10*3/uL (ref 1.7–7.7)
Neutrophils Relative %: 49 %
Platelet Count: 282 10*3/uL (ref 150–400)
RBC: 4.51 MIL/uL (ref 3.87–5.11)
RDW: 14.5 % (ref 11.5–15.5)
WBC Count: 6.4 10*3/uL (ref 4.0–10.5)
nRBC: 0 % (ref 0.0–0.2)

## 2024-01-14 LAB — CMP (CANCER CENTER ONLY)
ALT: 12 U/L (ref 0–44)
AST: 15 U/L (ref 15–41)
Albumin: 3.5 g/dL (ref 3.5–5.0)
Alkaline Phosphatase: 66 U/L (ref 38–126)
Anion gap: 9 (ref 5–15)
BUN: 7 mg/dL (ref 6–20)
CO2: 24 mmol/L (ref 22–32)
Calcium: 8.9 mg/dL (ref 8.9–10.3)
Chloride: 104 mmol/L (ref 98–111)
Creatinine: 0.61 mg/dL (ref 0.44–1.00)
GFR, Estimated: >60 mL/min (ref >60)
Glucose, Bld: 146 mg/dL — ABNORMAL HIGH (ref 70–99)
Potassium: 3.7 mmol/L (ref 3.5–5.1)
Sodium: 137 mmol/L (ref 135–145)
Total Bilirubin: 0.6 mg/dL (ref 0.0–1.2)
Total Protein: 7.4 g/dL (ref 6.5–8.1)

## 2024-01-14 MED ORDER — OXYCODONE HCL 5 MG PO TABS
5.0000 mg | ORAL_TABLET | Freq: Once | ORAL | Status: AC
  Administered 2024-01-14: 5 mg via ORAL
  Filled 2024-01-14: qty 1

## 2024-01-14 MED ORDER — HYDROMORPHONE HCL 1 MG/ML IJ SOLN
0.5000 mg | Freq: Once | INTRAMUSCULAR | Status: AC
  Administered 2024-01-14: 0.5 mg via INTRAMUSCULAR
  Filled 2024-01-14: qty 0.5

## 2024-01-14 NOTE — Telephone Encounter (Signed)
Called to reschedule lab only appointment to later- appointment rescheduled as requested

## 2024-01-14 NOTE — ED Provider Triage Note (Signed)
Emergency Medicine Provider Triage Evaluation Note  Carol Ortega , a 53 y.o. female  was evaluated in triage.  Pt complains of left wrist pain. Patient was seen here 2 days ago and had a negative xray, based on chart review previous provider was suspicious of a scaphoid fracture. Patient says her pain is not improving and she is having numbness.  Review of Systems  Positive: Wrist pain Negative:   Physical Exam  BP (!) 143/93 (BP Location: Right Arm)   Pulse 84   Temp 98.4 F (36.9 C) (Oral)   Resp 20   Ht 5\' 6"  (1.676 m)   Wt (!) 137.9 kg   LMP 06/18/2019   SpO2 99%   BMI 49.07 kg/m  Gen:   Awake, no distress   Resp:  Normal effort  MSK:   Moves extremities without difficulty  Other:    Medical Decision Making  Medically screening exam initiated at 4:43 PM.  Appropriate orders placed.  Hali Balgobin was informed that the remainder of the evaluation will be completed by another provider, this initial triage assessment does not replace that evaluation, and the importance of remaining in the ED until their evaluation is complete.     Cameron Ali, PA-C 01/14/24 1645

## 2024-01-14 NOTE — ED Triage Notes (Signed)
Pt in via POV, reports being seen for left wrist injury a couple of days ago, dx w/ wrist sprain, advised to come back if became worse.    States her pain has intensified since then, states last night having some numbness/tingling to left hand w/ the pain.  Ambulatory to triage, NAD noted at this time.

## 2024-01-22 ENCOUNTER — Other Ambulatory Visit: Payer: Self-pay | Admitting: Nurse Practitioner

## 2024-01-22 ENCOUNTER — Other Ambulatory Visit: Payer: Self-pay

## 2024-01-22 DIAGNOSIS — S62002A Unspecified fracture of navicular [scaphoid] bone of left wrist, initial encounter for closed fracture: Secondary | ICD-10-CM | POA: Diagnosis not present

## 2024-01-22 DIAGNOSIS — R7303 Prediabetes: Secondary | ICD-10-CM | POA: Diagnosis not present

## 2024-01-22 DIAGNOSIS — S63502A Unspecified sprain of left wrist, initial encounter: Secondary | ICD-10-CM | POA: Diagnosis not present

## 2024-01-22 DIAGNOSIS — C9210 Chronic myeloid leukemia, BCR/ABL-positive, not having achieved remission: Secondary | ICD-10-CM

## 2024-01-22 NOTE — Progress Notes (Signed)
 Specialty Pharmacy Refill Coordination Note  Carol Ortega is a 53 y.o. female contacted today regarding refills of specialty medication(s) Dasatinib  (Sprycel )   Patient requested Delivery   Delivery date: 01/29/24   Verified address: [address]   [phone]   [place] [address]   Medication will be filled on 01/28/24.   Pending refill request  This fill date is pending response to refill request from provider. Patient is aware and if they have not received fill by intended date, they must follow up with pharmacy.

## 2024-01-23 ENCOUNTER — Other Ambulatory Visit: Payer: Self-pay

## 2024-01-23 MED ORDER — SPRYCEL 100 MG PO TABS
100.0000 mg | ORAL_TABLET | Freq: Every day | ORAL | 0 refills | Status: DC
  Filled 2024-01-23: qty 30, 30d supply, fill #0

## 2024-01-25 DIAGNOSIS — F4312 Post-traumatic stress disorder, chronic: Secondary | ICD-10-CM | POA: Diagnosis not present

## 2024-01-25 LAB — BCR-ABL1 KINASE DOMAIN MUTATION ANALYSIS

## 2024-01-28 ENCOUNTER — Other Ambulatory Visit: Payer: Self-pay

## 2024-01-29 ENCOUNTER — Telehealth: Payer: Self-pay

## 2024-01-29 DIAGNOSIS — C9210 Chronic myeloid leukemia, BCR/ABL-positive, not having achieved remission: Secondary | ICD-10-CM

## 2024-01-29 MED ORDER — NILOTINIB HCL 200 MG PO CAPS
400.0000 mg | ORAL_CAPSULE | Freq: Two times a day (BID) | ORAL | 0 refills | Status: DC
  Filled 2024-01-30: qty 112, 28d supply, fill #0

## 2024-01-29 NOTE — Telephone Encounter (Signed)
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  CBC and CMP from 01/14/24 assessed, no baseline dose adjustments required.  Current medication list in Epic reviewed, DDIs with nilotinib identified: Ondansetron: may enhance QTc prolonging effects in addition to nilotinib. Ensure routine monitoring with ECG upon initiation of nilotinib therapy. No modifications to therapy necessary at this time.  Evaluated chart and no patient barriers to medication adherence noted.   Prescription has been e-scribed to the [facility] for benefits analysis and approval.  Oral Oncology Clinic will continue to follow for insurance authorization, copayment issues, initial counseling and start date.  Jerry Caras, PharmD PGY2 Oncology Pharmacy Resident   01/29/2024 9:13 AM

## 2024-01-30 ENCOUNTER — Other Ambulatory Visit: Payer: Self-pay

## 2024-01-30 ENCOUNTER — Telehealth: Payer: Self-pay

## 2024-01-30 ENCOUNTER — Other Ambulatory Visit (HOSPITAL_COMMUNITY): Payer: Self-pay

## 2024-01-30 NOTE — Telephone Encounter (Signed)
Patient successfully OnBoarded and drug education provided by pharmacist. Medication scheduled to be shipped on Thursday, 01/31/24, for delivery on Friday, 02/01/24, from [facility] to patient's address. Patient also knows to call me at [phone] with any questions or concerns regarding receiving medication or if there is any unexpected change in co-pay.    Ardeen Fillers, CPhT Oncology Pharmacy Patient Advocate  [facility]  [phone] (phone) [phone] (fax) 01/30/2024 1:12 PM

## 2024-01-30 NOTE — Progress Notes (Signed)
Specialty Pharmacy Initial Fill Coordination Note  Lamiyah Schlotter is a 53 y.o. female contacted today regarding initial fill of specialty medication(s) Nilotinib HCl Fayrene Helper)  Patient requested Delivery   Delivery date: 02/01/24   Verified address: [address], [place], [place] [address]  Medication will be filled on 01/31/24.   Patient is aware of $4.00 copayment. Bill to [place].    Ardeen Fillers, CPhT Oncology Pharmacy Patient Advocate  [facility]  [phone] (phone) [phone] (fax) 01/30/2024 1:11 PM

## 2024-01-30 NOTE — Telephone Encounter (Signed)
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   Counseled patient on administration, dosing, side effects, monitoring, drug-food interactions, safe handling, storage, and disposal. Patient will take 2 capsules (400 mg total) by mouth every 12 (twelve) hours. She knows to take on an empty stomach, at least one hour before or two hours after food. .  Side effects include but not limited to: rash, headache, myalgias, and myelosuppression .    Reviewed with patient importance of keeping a medication schedule and plan for any missed doses.  After discussion with patient no patient barriers to medication adherence identified.   She voiced understanding and appreciation. All questions answered. Medication handout provided.  Provided patient with Oral Chemotherapy Navigation Clinic phone number. Patient knows to call the office with questions or concerns. Oral Chemotherapy Navigation Clinic will continue to follow.   Jerry Caras, PharmD PGY2 Oncology Pharmacy Resident   01/30/2024 1:08 PM

## 2024-01-30 NOTE — Progress Notes (Signed)
Patient education documented in EPIC note on 02/08/24.

## 2024-01-30 NOTE — Telephone Encounter (Signed)
Oral Oncology Patient Advocate Encounter  After completing a benefits investigation, prior authorization for Tasigna is not required at this time through [facility].  Patient's copay is $4.00.     Ardeen Fillers, CPhT Oncology Pharmacy Patient Advocate  [facility]  [phone] (phone) [phone] (fax) 01/30/2024 11:07 AM

## 2024-01-31 ENCOUNTER — Other Ambulatory Visit: Payer: Self-pay

## 2024-02-04 ENCOUNTER — Telehealth: Payer: Self-pay | Admitting: *Deleted

## 2024-02-04 NOTE — Telephone Encounter (Signed)
Pt takes 2 pills in am and then 12 hours another 2 tablets every day. Pt is ok now.

## 2024-02-11 DIAGNOSIS — S63502D Unspecified sprain of left wrist, subsequent encounter: Secondary | ICD-10-CM | POA: Diagnosis not present

## 2024-02-13 ENCOUNTER — Encounter: Payer: Medicaid Other | Admitting: Gastroenterology

## 2024-02-20 ENCOUNTER — Other Ambulatory Visit: Payer: Self-pay | Admitting: Oncology

## 2024-02-20 ENCOUNTER — Other Ambulatory Visit: Payer: Self-pay

## 2024-02-20 DIAGNOSIS — C9210 Chronic myeloid leukemia, BCR/ABL-positive, not having achieved remission: Secondary | ICD-10-CM

## 2024-02-22 ENCOUNTER — Other Ambulatory Visit: Payer: Self-pay

## 2024-02-22 DIAGNOSIS — Z79899 Other long term (current) drug therapy: Secondary | ICD-10-CM

## 2024-02-22 DIAGNOSIS — C9210 Chronic myeloid leukemia, BCR/ABL-positive, not having achieved remission: Secondary | ICD-10-CM

## 2024-02-22 MED ORDER — NILOTINIB HCL 200 MG PO CAPS
400.0000 mg | ORAL_CAPSULE | Freq: Two times a day (BID) | ORAL | 0 refills | Status: DC
  Filled 2024-02-22: qty 112, 28d supply, fill #0

## 2024-02-22 NOTE — Progress Notes (Signed)
 Specialty Pharmacy Ongoing Clinical Assessment Note  Carol Ortega is a 53 y.o. female who is being followed by the specialty pharmacy service for RxSp Oncology   Patient's specialty medication(s) reviewed today: Tasigna  Missed doses in the last 4 weeks: 0  Patient/Caregiver did not have any additional questions or concerns.     Goals Addressed             This Visit's Progress    Achieve or maintain remission       Patient is unable to be assessed as therapy was recently initiated. Patient will maintain adherence          Follow up:  3 months  Otto Herb Specialty Pharmacist

## 2024-02-22 NOTE — Progress Notes (Signed)
 Specialty Pharmacy Refill Coordination Note  Carol Ortega is a 53 y.o. female contacted today regarding refills of specialty medication(s) Tasigna  Patient requested Delivery  Delivery date: 02/26/24  Verified address: [address], [place]  Medication will be filled on 02/25/24.

## 2024-02-23 DIAGNOSIS — M79671 Pain in right foot: Secondary | ICD-10-CM | POA: Diagnosis not present

## 2024-02-25 ENCOUNTER — Other Ambulatory Visit: Payer: Self-pay

## 2024-02-29 ENCOUNTER — Inpatient Hospital Stay: Attending: Oncology

## 2024-02-29 DIAGNOSIS — C9210 Chronic myeloid leukemia, BCR/ABL-positive, not having achieved remission: Secondary | ICD-10-CM | POA: Diagnosis not present

## 2024-02-29 DIAGNOSIS — Z79899 Other long term (current) drug therapy: Secondary | ICD-10-CM | POA: Diagnosis not present

## 2024-02-29 LAB — CMP (CANCER CENTER ONLY)
ALT: 52 U/L — ABNORMAL HIGH (ref 0–44)
AST: 28 U/L (ref 15–41)
Albumin: 3.4 g/dL — ABNORMAL LOW (ref 3.5–5.0)
Alkaline Phosphatase: 78 U/L (ref 38–126)
Anion gap: 6 (ref 5–15)
BUN: 11 mg/dL (ref 6–20)
CO2: 28 mmol/L (ref 22–32)
Calcium: 8.8 mg/dL — ABNORMAL LOW (ref 8.9–10.3)
Chloride: 103 mmol/L (ref 98–111)
Creatinine: 0.76 mg/dL (ref 0.44–1.00)
GFR, Estimated: >60 mL/min (ref >60)
Glucose, Bld: 115 mg/dL — ABNORMAL HIGH (ref 70–99)
Potassium: 3.6 mmol/L (ref 3.5–5.1)
Sodium: 137 mmol/L (ref 135–145)
Total Bilirubin: 0.8 mg/dL (ref 0.0–1.2)
Total Protein: 7.2 g/dL (ref 6.5–8.1)

## 2024-02-29 LAB — CBC WITH DIFFERENTIAL (CANCER CENTER ONLY)
Abs Immature Granulocytes: 0.04 10*3/uL (ref 0.00–0.07)
Basophils Absolute: 0.1 10*3/uL (ref 0.0–0.1)
Basophils Relative: 1 %
Eosinophils Absolute: 0.3 10*3/uL (ref 0.0–0.5)
Eosinophils Relative: 3 %
HCT: 40.4 % (ref 36.0–46.0)
Hemoglobin: 12.6 g/dL (ref 12.0–15.0)
Immature Granulocytes: 1 %
Lymphocytes Relative: 27 %
Lymphs Abs: 2.2 10*3/uL (ref 0.7–4.0)
MCH: 28.2 pg (ref 26.0–34.0)
MCHC: 31.2 g/dL (ref 30.0–36.0)
MCV: 90.4 fL (ref 80.0–100.0)
Monocytes Absolute: 0.8 10*3/uL (ref 0.1–1.0)
Monocytes Relative: 10 %
Neutro Abs: 5 10*3/uL (ref 1.7–7.7)
Neutrophils Relative %: 58 %
Platelet Count: 289 10*3/uL (ref 150–400)
RBC: 4.47 MIL/uL (ref 3.87–5.11)
RDW: 14.5 % (ref 11.5–15.5)
WBC Count: 8.4 10*3/uL (ref 4.0–10.5)
nRBC: 0 % (ref 0.0–0.2)

## 2024-03-05 LAB — BCR-ABL1 FISH
Cells Analyzed: 200
Cells Counted: 200

## 2024-03-08 LAB — BCR-ABL1, CML/ALL, PCR, QUANT
E1A2 Transcript: <0.0032 %
b2a2 transcript: 0.2311 %
b3a2 transcript: <0.0032 %

## 2024-03-10 ENCOUNTER — Inpatient Hospital Stay (HOSPITAL_BASED_OUTPATIENT_CLINIC_OR_DEPARTMENT_OTHER): Admitting: Oncology

## 2024-03-10 ENCOUNTER — Encounter: Payer: Self-pay | Admitting: Oncology

## 2024-03-10 VITALS — BP 109/76 | HR 84 | Temp 96.1°F | Resp 16 | Wt 296.0 lb

## 2024-03-10 DIAGNOSIS — Z79899 Other long term (current) drug therapy: Secondary | ICD-10-CM | POA: Diagnosis not present

## 2024-03-10 DIAGNOSIS — C9210 Chronic myeloid leukemia, BCR/ABL-positive, not having achieved remission: Secondary | ICD-10-CM

## 2024-03-10 NOTE — Progress Notes (Signed)
 Hematology/Oncology Consult note [facility]  [phone] [phone]  Patient Care Team: Iona Hansen, NP as PCP - General (Nurse Practitioner) Creig Hines, MD as Consulting Physician (Oncology)   Name of the patient: Carol Ortega  [id number]  Mar 02, 1971   Date of visit: 03/10/24  Diagnosis-chronic phase CML  Chief complaint/ Reason for visit-routine follow-up of CML on nilotinib  Heme/Onc history: Patient is a 53 year old African-American femaleWho presents to the ER with symptoms of left-sided flank pain.  CT abdomen and pelvis without contrast showed moderate left hydronephrosis secondary to a 1.1 x 0.9 x 1.3 cm calculus at the left ureteropelvic junction/proximal left ureter.  No other evidence of adenopathy and spleen appeared normal.  Incidentally patient was noted to have a white cell count of 138 which on repeat check was 133.  Platelet counts normal and hemoglobin was 12.1.  Last normal CBC was in April 2021 when white count was 7.2.  Presently differential mainly shows neutrophilia along with lymphocytosis monocytosis basophilia and eosinophilia.  Immature granulocytes were seen.  Pathology smear review shows findings concerning for myeloproliferative neoplasm.  Blasts possibly less than 20% and BCR ABL testing was recommended.Patient noted to have wbc of 18 in may 2022 again with predominant left shift.   BCR ABL FISH testing showed 100% nuclei positive for BCR-ABL gene fusion signals.  BCR ABL PCR testing showed B2 A2 transcript 847.313 and B3 A2 transcript less than 0.0032%.  E1 A2 transcript 0.5645%.  Peripheral blood flow cytometry showedAberrant myeloblast population about 2% of the leukocytes.  Absolute neutrophilia eosinophilia and basophilia.  Patient started Dasatinib in early December 2022. Bone marrow biopsy on 01/25/2021 showed hypercellular bone marrow with pan myeloid proliferation.  No increased number of blasts seen.   There was a concern for increasing BCR-ABL transcripts in April 2024.  Mutational analysis showedp.([id number]) of uncertain significance.Repeat bone marrow biopsy did not reveal any evidence of progression.  Orderly maturation noted in erythroid and granulocytic precursors.  Megakaryocytes slightly increased in number with focal clustering and slight hyper lobulation.  Subsequently BCR-ABL transcripts decreased to less than 1% and therefore patient was continued on Dasatinib.  Due to persistent increase in BCR-ABL transcripts to more than 1% patient was switched to nilotinib in February 2025   Interval history-tolerating the Ladan it well without any significant side effects.  Occasional dizziness when she stands up.  Denies any chest pain or shortness of breath.  She has had a couple of episodes of interscapular pain which lasts for less than a minute and resolves on its own.  ECOG PS- 0 Pain scale- 0   Review of systems- Review of Systems  Constitutional:  Negative for chills, fever, malaise/fatigue and weight loss.  HENT:  Negative for congestion, ear discharge and nosebleeds.   Eyes:  Negative for blurred vision.  Respiratory:  Negative for cough, hemoptysis, sputum production, shortness of breath and wheezing.   Cardiovascular:  Negative for chest pain, palpitations, orthopnea and claudication.  Gastrointestinal:  Negative for abdominal pain, blood in stool, constipation, diarrhea, heartburn, melena, nausea and vomiting.  Genitourinary:  Negative for dysuria, flank pain, frequency, hematuria and urgency.  Musculoskeletal:  Negative for back pain, joint pain and myalgias.  Skin:  Negative for rash.  Neurological:  Negative for dizziness, tingling, focal weakness, seizures, weakness and headaches.  Endo/Heme/Allergies:  Does not bruise/bleed easily.  Psychiatric/Behavioral:  Negative for depression and suicidal ideas. The patient does not have insomnia.  Allergies  Allergen  Reactions   Bee Venom Anaphylaxis   Egg-Derived Products Anaphylaxis   Other Anaphylaxis    Surgical dye.. Pt states ok to take if previously taken benadryl or prednisone   Penicillins Hives and Other (See Comments)    Has patient had a PCN reaction causing immediate rash, facial/tongue/throat swelling, SOB or lightheadedness with hypotension: No Has patient had a PCN reaction causing severe rash involving mucus membranes or skin necrosis: No Has patient had a PCN reaction that required hospitalization No Has patient had a PCN reaction occurring within the last 10 years: No If all of the above answers are "NO", then may proceed with Cephalosporin use.   Latex Rash   Oxycodone-Acetaminophen Itching   Wellbutrin [Bupropion] Other (See Comments)    Irritiability     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   Complication of anesthesia 1999   lung collapse after surgery for gallbladder   Depression    Ectopic pregnancy    Headache    migraines   Hoarseness of voice    Hypertension    Kidney stone    Obesity    Parathyroid abnormality (HCC)    Pulmonary emboli (HCC) 2011   Pulmonary embolism (HCC)    Radiation    Brain tumor   Thyroid disease    UTI (urinary tract infection)    Vitamin D deficiency      Past Surgical History:  Procedure Laterality Date   CHOLECYSTECTOMY  1999   CYSTOSCOPY W/ RETROGRADES Left 03/01/2018   Procedure: CYSTOSCOPY WITH RETROGRADE PYELOGRAM;  Surgeon: Riki Altes, MD;  Location: [facility];  Service: Urology;  Laterality: Left;   CYSTOSCOPY W/ URETERAL STENT PLACEMENT Right 04/08/2017   Procedure: CYSTOSCOPY WITH RETROGRADE PYELOGRAM/URETERAL STENT PLACEMENT;  Surgeon: Barron Alvine, MD;  Location: [facility];  Service: Urology;  Laterality: Right;   CYSTOSCOPY W/ URETERAL STENT  PLACEMENT Left 03/12/2018   Procedure: CYSTOSCOPY WITH STENT REPLACEMENT;  Surgeon: Riki Altes, MD;  Location: [facility];  Service: Urology;  Laterality: Left;   CYSTOSCOPY W/ URETEROSCOPY     CYSTOSCOPY WITH RETROGRADE PYELOGRAM, URETEROSCOPY AND STENT PLACEMENT Left 01/20/2014   Procedure: LEFT URETEROSCOPY WITH STENT PLACEMENT;  Surgeon: Bjorn Pippin, MD;  Location: [facility];  Service: Urology;  Laterality: Left;   CYSTOSCOPY WITH RETROGRADE PYELOGRAM, URETEROSCOPY AND STENT PLACEMENT Bilateral 04/23/2015   Procedure: CYSTOSCOPY WITH BILATERAL RETROGRADE PYELOGRAM, URETEROSCOPY AND STENT PLACEMENT;  Surgeon: Sebastian Ache, MD;  Location: [facility];  Service: Urology;  Laterality: Bilateral;   CYSTOSCOPY WITH RETROGRADE PYELOGRAM, URETEROSCOPY AND STENT PLACEMENT Right 04/13/2017   Procedure: CYSTOSCOPY WITH RETROGRADE PYELOGRAM, URETEROSCOPY AND STENT EXCHANGE;  Surgeon: Sebastian Ache, MD;  Location: [facility];  Service: Urology;  Laterality: Right;   CYSTOSCOPY WITH STENT PLACEMENT Left 01/12/2014   Procedure: CYSTOSCOPY WITH STENT PLACEMENT left retrograde;  Surgeon: Bjorn Pippin, MD;  Location: [facility];  Service: Urology;  Laterality: Left;   CYSTOSCOPY WITH STENT PLACEMENT Left 03/01/2018   Procedure: CYSTOSCOPY WITH STENT PLACEMENT;  Surgeon: Riki Altes, MD;  Location: [facility];  Service: Urology;  Laterality: Left;   CYSTOSCOPY/URETEROSCOPY/HOLMIUM LASER/STENT PLACEMENT Left 03/12/2018   Procedure: CYSTOSCOPY/URETEROSCOPY/HOLMIUM LASER;  Surgeon: Riki Altes, MD;  Location: [facility];  Service: Urology;  Laterality:  Left;   CYSTOSCOPY/URETEROSCOPY/HOLMIUM LASER/STENT PLACEMENT Left 11/15/2021   Procedure: CYSTOSCOPY/URETEROSCOPY/HOLMIUM LASER/STENT PLACEMENT;  Surgeon: Riki Altes, MD;  Location: [facility];  Service: Urology;  Laterality: Left;   HOLMIUM LASER APPLICATION Left 01/20/2014   Procedure: HOLMIUM LASER APPLICATION;  Surgeon: Bjorn Pippin, MD;  Location: [facility];  Service: Urology;   Laterality: Left;   HOLMIUM LASER APPLICATION Bilateral 04/23/2015   Procedure: HOLMIUM LASER APPLICATION;  Surgeon: Sebastian Ache, MD;  Location: [facility];  Service: Urology;  Laterality: Bilateral;   HOLMIUM LASER APPLICATION Right 04/13/2017   Procedure: HOLMIUM LASER APPLICATION;  Surgeon: Sebastian Ache, MD;  Location: [facility];  Service: Urology;  Laterality: Right;   IR BONE MARROW BIOPSY & ASPIRATION  03/27/2023   kidney stone removal     LITHOTRIPSY     PARATHYROIDECTOMY Right 11/13/2016   Procedure: RIGHT SUPERIOR PARATHYROIDECTOMY;  Surgeon: Darnell Level, MD;  Location: [facility];  Service: General;  Laterality: Right;   surgery for,ectopic pregnancy  2011    1 fallopian tube rupture   TUBAL LIGATION  2011    Social History   Socioeconomic History   Marital status: Divorced    Spouse name: Not on file   Number of children: 4   Years of education: Not on file   Highest education level: Some college, no degree  Occupational History   Not on file  Tobacco Use   Smoking status: Never   Smokeless tobacco: Never  Vaping Use   Vaping status: Never Used  Substance and Sexual Activity   Alcohol use: Not Currently   Drug use: Not Currently    Types: Marijuana   Sexual activity: Yes  Other Topics Concern   Not on file  Social History Narrative   Lives at home with her son, independent at baselinepend   Social Drivers of Health   Financial Resource Strain: High Risk (01/22/2024)   Received from [facility]   Overall Financial Resource Strain (CARDIA)    Difficulty of Paying Living Expenses: Hard  Food Insecurity: Food Insecurity Present (01/22/2024)   Received from [facility]   Hunger Vital Sign    Worried About Running Out of Food in the Last Year: Sometimes true    Ran Out of Food in the Last Year: Sometimes true  Transportation Needs: No Transportation Needs (01/22/2024)   Received from [facility] - Transportation     In the past 12 months, has lack of transportation kept you from medical appointments or from getting medications?: No    Lack of Transportation (Non-Medical): No  Physical Activity: Inactive (09/11/2018)   Exercise Vital Sign    Days of Exercise per Week: 0 days    Minutes of Exercise per Session: 0 min  Stress: Stress Concern Present (09/11/2018)   [employer] of Occupational Health - Occupational Stress Questionnaire    Feeling of Stress : Very much  Social Connections: Moderately Isolated (09/11/2018)   Social Connection and Isolation Panel [NHANES]    Frequency of Communication with Friends and Family: More than three times a week    Frequency of Social Gatherings with Friends and Family: More than three times a week    Attends Religious Services: Never    Database administrator or Organizations: No    Attends Banker Meetings: Never    Marital Status: Divorced  Catering manager Violence: Not At Risk (09/11/2018)   Humiliation, Afraid, Rape, and Kick questionnaire  Fear of Current or Ex-Partner: No    Emotionally Abused: No    Physically Abused: No    Sexually Abused: No    Family History  Problem Relation Age of Onset   Colon polyps Mother    Bell's palsy Mother    Heart failure Mother    Hypertension Mother    Lupus Mother    Anxiety disorder Mother    Depression Mother    Stroke Mother    Asthma Father    Hypertension Father    Hyperlipidemia Father    Anxiety disorder Father    Depression Father    Anxiety disorder Sister    Depression Sister    Bipolar disorder Sister    Hypertension Maternal Grandmother    Diabetes Maternal Grandmother    Sarcoidosis Maternal Grandmother    Depression Son    Bipolar disorder Son    Colon cancer Neg Hx      Current Outpatient Medications:    acetaminophen (TYLENOL) 325 MG tablet, Take 650 mg by mouth every 6 (six) hours as needed for mild pain., Disp: , Rfl:    albuterol (PROVENTIL) (2.5 MG/3ML)  0.083% nebulizer solution, Take 3 mLs (2.5 mg total) by nebulization every 6 (six) hours as needed for wheezing or shortness of breath., Disp: 75 mL, Rfl: 12   alclomethasone (ACLOVATE) 0.05 % cream, Apply topically., Disp: , Rfl:    cyanocobalamin (VITAMIN B12) 1000 MCG/ML injection, Inject 1,000 mcg into the muscle once., Disp: , Rfl:    EPINEPHrine 0.3 mg/0.3 mL IJ SOAJ injection, Inject into the muscle., Disp: , Rfl:    nilotinib (TASIGNA) 200 MG capsule, Take 2 capsules (400 mg total) by mouth every 12 (twelve) hours. Take on an empty stomach, at least one hour before or two hours after food., Disp: 112 capsule, Rfl: 0   nystatin ointment (MYCOSTATIN), Apply 1 Application topically 2 (two) times daily. Mix together with equal amount of triamcinolone ointment and apply to affected area., Disp: 30 g, Rfl: 0   ondansetron (ZOFRAN-ODT) 4 MG disintegrating tablet, Dis 1 T on the tongue every 6 hours prn, Disp: , Rfl:    Semaglutide-Weight Management (WEGOVY) 0.25 MG/0.5ML SOAJ, Inject into the skin., Disp: , Rfl:    triamcinolone ointment (KENALOG) 0.5 %, Apply 1 Application topically 2 (two) times daily. Mix with equal parts nystatin ointment before applying., Disp: 30 g, Rfl: 0   VENTOLIN HFA 108 (90 Base) MCG/ACT inhaler, Inhale 2 puffs into the lungs every 6 (six) hours as needed., Disp: , Rfl:    Vitamin D, Ergocalciferol, (DRISDOL) 1.25 MG (50000 UNIT) CAPS capsule, Take 1 capsule by mouth once a week., Disp: , Rfl:    dicyclomine (BENTYL) 10 MG capsule, Take 1 capsule (10 mg total) by mouth 4 (four) times daily -  before meals and at bedtime. (Patient not taking: Reported on 03/10/2024), Disp: 90 capsule, Rfl: 1   DULoxetine (CYMBALTA) 60 MG capsule, Take 1 capsule by mouth daily. (Patient not taking: Reported on 09/21/2023), Disp: , Rfl:    fluconazole (DIFLUCAN) 150 MG tablet, Take by mouth. (Patient not taking: Reported on 12/14/2023), Disp: , Rfl:    hydrOXYzine (ATARAX) 10 MG tablet, Take 10  mg by mouth at bedtime. (Patient not taking: Reported on 09/21/2023), Disp: , Rfl:   Physical exam:  Vitals:   03/10/24 1449  BP: 109/76  Pulse: 84  Resp: 16  Temp: (!) 96.1 F (35.6 C)  TempSrc: Tympanic  SpO2: 99%  Weight: 296 lb (  134.3 kg)   Physical Exam Cardiovascular:     Rate and Rhythm: Normal rate and regular rhythm.     Heart sounds: Normal heart sounds.  Pulmonary:     Effort: Pulmonary effort is normal.     Breath sounds: Normal breath sounds.  Skin:    General: Skin is warm and dry.  Neurological:     Mental Status: She is alert and oriented to person, place, and time.         Latest Ref Rng & Units 02/29/2024    9:45 AM  CMP  Glucose 70 - 99 mg/dL 161   BUN 6 - 20 mg/dL 11   Creatinine 0.96 - 1.00 mg/dL 0.45   Sodium 409 - 811 mmol/L 137   Potassium 3.5 - 5.1 mmol/L 3.6   Chloride 98 - 111 mmol/L 103   CO2 22 - 32 mmol/L 28   Calcium 8.9 - 10.3 mg/dL 8.8   Total Protein 6.5 - 8.1 g/dL 7.2   Total Bilirubin 0.0 - 1.2 mg/dL 0.8   Alkaline Phos 38 - 126 U/L 78   AST 15 - 41 U/L 28   ALT 0 - 44 U/L 52       Latest Ref Rng & Units 02/29/2024    9:45 AM  CBC  WBC 4.0 - 10.5 K/uL 8.4   Hemoglobin 12.0 - 15.0 g/dL 91.4   Hematocrit 78.2 - 46.0 % 40.4   Platelets 150 - 400 K/uL 289      Assessment and plan- Patient is a 53 y.o. female with history of chronic phase CML here for a routine follow-up:  Patient was started on nilotinib in February 2025.   Her BCR-ABL PCR transcripts have come down from a prior value of 2.7% presently to 0.23%.  Plan is to continue nilotinib until progression or toxicity.  CBC with differential CMP in 1 month and 2 months and I will see her back in 3 months with CBC with differential CMP BCR ABL PCR testing and lipid profile.   Visit Diagnosis 1. CML (chronic myelocytic leukemia) (HCC)   2. High risk medication use      Dr. Owens Shark, MD, MPH [facility] at [facility] [phone] 03/10/2024 4:34  PM

## 2024-03-13 ENCOUNTER — Other Ambulatory Visit: Payer: Self-pay

## 2024-03-17 ENCOUNTER — Other Ambulatory Visit (HOSPITAL_COMMUNITY): Payer: Self-pay

## 2024-03-19 ENCOUNTER — Other Ambulatory Visit (HOSPITAL_COMMUNITY): Payer: Self-pay

## 2024-03-19 DIAGNOSIS — F4312 Post-traumatic stress disorder, chronic: Secondary | ICD-10-CM | POA: Diagnosis not present

## 2024-03-24 ENCOUNTER — Other Ambulatory Visit: Payer: Self-pay | Admitting: Oncology

## 2024-03-24 ENCOUNTER — Other Ambulatory Visit: Payer: Self-pay

## 2024-03-24 DIAGNOSIS — C9210 Chronic myeloid leukemia, BCR/ABL-positive, not having achieved remission: Secondary | ICD-10-CM

## 2024-03-24 NOTE — Progress Notes (Signed)
 Specialty Pharmacy Refill Coordination Note  Carol Ortega is a 53 y.o. female contacted today regarding refills of specialty medication(s) Nilotinib HCl Fayrene Helper)   Patient requested Delivery   Delivery date: 04/03/24   Verified address: [address], [place], [place] [address]   Medication will be filled on 04/02/24.

## 2024-03-25 ENCOUNTER — Other Ambulatory Visit: Payer: Self-pay

## 2024-03-25 MED ORDER — NILOTINIB HCL 200 MG PO CAPS
400.0000 mg | ORAL_CAPSULE | Freq: Two times a day (BID) | ORAL | 0 refills | Status: DC
  Filled 2024-03-25: qty 112, 28d supply, fill #0

## 2024-03-28 DIAGNOSIS — S62002D Unspecified fracture of navicular [scaphoid] bone of left wrist, subsequent encounter for fracture with routine healing: Secondary | ICD-10-CM | POA: Diagnosis not present

## 2024-04-02 ENCOUNTER — Other Ambulatory Visit: Payer: Self-pay

## 2024-04-08 ENCOUNTER — Inpatient Hospital Stay: Attending: Oncology

## 2024-04-08 DIAGNOSIS — C9210 Chronic myeloid leukemia, BCR/ABL-positive, not having achieved remission: Secondary | ICD-10-CM | POA: Insufficient documentation

## 2024-04-08 LAB — CMP (CANCER CENTER ONLY)
ALT: 38 U/L (ref 0–44)
AST: 25 U/L (ref 15–41)
Albumin: 3.4 g/dL — ABNORMAL LOW (ref 3.5–5.0)
Alkaline Phosphatase: 79 U/L (ref 38–126)
Anion gap: 9 (ref 5–15)
BUN: 8 mg/dL (ref 6–20)
CO2: 26 mmol/L (ref 22–32)
Calcium: 9 mg/dL (ref 8.9–10.3)
Chloride: 103 mmol/L (ref 98–111)
Creatinine: 0.84 mg/dL (ref 0.44–1.00)
GFR, Estimated: >60 mL/min
Glucose, Bld: 102 mg/dL — ABNORMAL HIGH (ref 70–99)
Potassium: 3.8 mmol/L (ref 3.5–5.1)
Sodium: 138 mmol/L (ref 135–145)
Total Bilirubin: 0.9 mg/dL (ref 0.0–1.2)
Total Protein: 7.2 g/dL (ref 6.5–8.1)

## 2024-04-08 LAB — CBC WITH DIFFERENTIAL (CANCER CENTER ONLY)
Abs Immature Granulocytes: 0.03 10*3/uL (ref 0.00–0.07)
Basophils Absolute: 0.1 10*3/uL (ref 0.0–0.1)
Basophils Relative: 1 %
Eosinophils Absolute: 0.2 10*3/uL (ref 0.0–0.5)
Eosinophils Relative: 3 %
HCT: 40.7 % (ref 36.0–46.0)
Hemoglobin: 12.9 g/dL (ref 12.0–15.0)
Immature Granulocytes: 0 %
Lymphocytes Relative: 34 %
Lymphs Abs: 2.5 10*3/uL (ref 0.7–4.0)
MCH: 28.1 pg (ref 26.0–34.0)
MCHC: 31.7 g/dL (ref 30.0–36.0)
MCV: 88.7 fL (ref 80.0–100.0)
Monocytes Absolute: 0.9 10*3/uL (ref 0.1–1.0)
Monocytes Relative: 12 %
Neutro Abs: 3.7 10*3/uL (ref 1.7–7.7)
Neutrophils Relative %: 50 %
Platelet Count: 308 10*3/uL (ref 150–400)
RBC: 4.59 MIL/uL (ref 3.87–5.11)
RDW: 14.2 % (ref 11.5–15.5)
WBC Count: 7.4 10*3/uL (ref 4.0–10.5)
nRBC: 0 % (ref 0.0–0.2)

## 2024-04-23 ENCOUNTER — Other Ambulatory Visit: Payer: Self-pay

## 2024-04-25 ENCOUNTER — Other Ambulatory Visit: Payer: Self-pay

## 2024-04-28 ENCOUNTER — Other Ambulatory Visit: Payer: Self-pay

## 2024-04-28 ENCOUNTER — Other Ambulatory Visit: Payer: Self-pay | Admitting: Pharmacy Technician

## 2024-04-28 ENCOUNTER — Other Ambulatory Visit (HOSPITAL_COMMUNITY): Payer: Self-pay

## 2024-04-28 ENCOUNTER — Other Ambulatory Visit: Payer: Self-pay | Admitting: Oncology

## 2024-04-28 DIAGNOSIS — C9210 Chronic myeloid leukemia, BCR/ABL-positive, not having achieved remission: Secondary | ICD-10-CM

## 2024-04-28 MED ORDER — NILOTINIB HCL 200 MG PO CAPS
400.0000 mg | ORAL_CAPSULE | Freq: Two times a day (BID) | ORAL | 0 refills | Status: DC
  Filled 2024-04-28 (×2): qty 112, 28d supply, fill #0

## 2024-04-28 NOTE — Progress Notes (Signed)
 Specialty Pharmacy Refill Coordination Note  Carol Ortega is a 53 y.o. female contacted today regarding refills of specialty medication(s) Nilotinib  HCl (TASIGNA )   Patient requested Delivery   Delivery date: 05/06/24   Verified address: [address]  **[phone]   Medication will be filled on 05/05/24.  This fill date is pending response to refill request from provider. Patient is aware and if they have not received fill by intended date they must follow up with pharmacy.

## 2024-04-28 NOTE — Telephone Encounter (Signed)
 Spoke with Carol Ortega at [facility] who confirmed she also did not see any changes to the previous script.

## 2024-05-01 DIAGNOSIS — I10 Essential (primary) hypertension: Secondary | ICD-10-CM | POA: Diagnosis not present

## 2024-05-01 DIAGNOSIS — F331 Major depressive disorder, recurrent, moderate: Secondary | ICD-10-CM | POA: Diagnosis not present

## 2024-05-01 DIAGNOSIS — R7989 Other specified abnormal findings of blood chemistry: Secondary | ICD-10-CM | POA: Diagnosis not present

## 2024-05-01 DIAGNOSIS — R7303 Prediabetes: Secondary | ICD-10-CM | POA: Diagnosis not present

## 2024-05-01 DIAGNOSIS — F411 Generalized anxiety disorder: Secondary | ICD-10-CM | POA: Diagnosis not present

## 2024-05-01 DIAGNOSIS — E538 Deficiency of other specified B group vitamins: Secondary | ICD-10-CM | POA: Diagnosis not present

## 2024-05-05 ENCOUNTER — Other Ambulatory Visit (HOSPITAL_COMMUNITY): Payer: Self-pay

## 2024-05-05 ENCOUNTER — Other Ambulatory Visit: Payer: Self-pay

## 2024-05-05 MED ORDER — TASIGNA 200 MG PO CAPS
400.0000 mg | ORAL_CAPSULE | Freq: Two times a day (BID) | ORAL | 0 refills | Status: DC
  Filled 2024-05-05: qty 112, 28d supply, fill #0

## 2024-05-05 NOTE — Progress Notes (Signed)
 Generic Tasigna  not available for order. Insurance prefers generic but due to availability call center required new rx for DAW1.

## 2024-05-05 NOTE — Telephone Encounter (Signed)
 Per [employer] (Specialty). Tasigna  is now generic but they can not yet order it. They are requesting a Brand DAW-1 rx be sent in.

## 2024-05-05 NOTE — Addendum Note (Signed)
 Addended by: Thaddeus Filippo on: 05/05/2024 01:47 PM   Modules accepted: Orders

## 2024-05-08 ENCOUNTER — Inpatient Hospital Stay

## 2024-05-22 ENCOUNTER — Telehealth: Payer: Self-pay | Admitting: Oncology

## 2024-05-22 ENCOUNTER — Inpatient Hospital Stay: Attending: Oncology

## 2024-05-22 DIAGNOSIS — Z79899 Other long term (current) drug therapy: Secondary | ICD-10-CM | POA: Insufficient documentation

## 2024-05-22 DIAGNOSIS — C9210 Chronic myeloid leukemia, BCR/ABL-positive, not having achieved remission: Secondary | ICD-10-CM | POA: Diagnosis present

## 2024-05-22 LAB — CMP (CANCER CENTER ONLY)
ALT: 34 U/L (ref 0–44)
AST: 25 U/L (ref 15–41)
Albumin: 3.3 g/dL — ABNORMAL LOW (ref 3.5–5.0)
Alkaline Phosphatase: 80 U/L (ref 38–126)
Anion gap: 8 (ref 5–15)
BUN: 11 mg/dL (ref 6–20)
CO2: 26 mmol/L (ref 22–32)
Calcium: 8.6 mg/dL — ABNORMAL LOW (ref 8.9–10.3)
Chloride: 104 mmol/L (ref 98–111)
Creatinine: 0.82 mg/dL (ref 0.44–1.00)
GFR, Estimated: >60 mL/min (ref >60)
Glucose, Bld: 111 mg/dL — ABNORMAL HIGH (ref 70–99)
Potassium: 3.8 mmol/L (ref 3.5–5.1)
Sodium: 138 mmol/L (ref 135–145)
Total Bilirubin: 1.3 mg/dL — ABNORMAL HIGH (ref 0.0–1.2)
Total Protein: 7.1 g/dL (ref 6.5–8.1)

## 2024-05-22 LAB — CBC WITH DIFFERENTIAL (CANCER CENTER ONLY)
Abs Immature Granulocytes: 0.03 10*3/uL (ref 0.00–0.07)
Basophils Absolute: 0.1 10*3/uL (ref 0.0–0.1)
Basophils Relative: 1 %
Eosinophils Absolute: 0.2 10*3/uL (ref 0.0–0.5)
Eosinophils Relative: 3 %
HCT: 39.6 % (ref 36.0–46.0)
Hemoglobin: 12.4 g/dL (ref 12.0–15.0)
Immature Granulocytes: 0 %
Lymphocytes Relative: 30 %
Lymphs Abs: 2.6 10*3/uL (ref 0.7–4.0)
MCH: 28 pg (ref 26.0–34.0)
MCHC: 31.3 g/dL (ref 30.0–36.0)
MCV: 89.4 fL (ref 80.0–100.0)
Monocytes Absolute: 0.9 10*3/uL (ref 0.1–1.0)
Monocytes Relative: 10 %
Neutro Abs: 4.8 10*3/uL (ref 1.7–7.7)
Neutrophils Relative %: 56 %
Platelet Count: 304 10*3/uL (ref 150–400)
RBC: 4.43 MIL/uL (ref 3.87–5.11)
RDW: 14.9 % (ref 11.5–15.5)
WBC Count: 8.6 10*3/uL (ref 4.0–10.5)
nRBC: 0 % (ref 0.0–0.2)

## 2024-05-22 NOTE — Telephone Encounter (Signed)
 Not sure who had started her on cymbalta but let her know to decrease cymbalta dosing to 30 mg daily

## 2024-05-22 NOTE — Telephone Encounter (Signed)
 She stopped by to see if you could check on the medication Duloxetine/Symbolta. She said PCP put her on it and she read it interacts with her Chemo medication. She was supposed to start last week but waited until today (she had labs) to see if she could speak to someone.    Please call her at [phone] to let her know what to do.

## 2024-05-23 ENCOUNTER — Encounter: Payer: Self-pay | Admitting: Oncology

## 2024-05-27 ENCOUNTER — Other Ambulatory Visit: Payer: Self-pay | Admitting: Oncology

## 2024-05-27 ENCOUNTER — Other Ambulatory Visit: Payer: Self-pay

## 2024-05-27 ENCOUNTER — Other Ambulatory Visit (HOSPITAL_COMMUNITY): Payer: Self-pay

## 2024-05-27 DIAGNOSIS — C9210 Chronic myeloid leukemia, BCR/ABL-positive, not having achieved remission: Secondary | ICD-10-CM

## 2024-05-27 DIAGNOSIS — F4312 Post-traumatic stress disorder, chronic: Secondary | ICD-10-CM | POA: Diagnosis not present

## 2024-05-27 MED ORDER — TASIGNA 200 MG PO CAPS
400.0000 mg | ORAL_CAPSULE | Freq: Two times a day (BID) | ORAL | 0 refills | Status: DC
Start: 2024-05-27 — End: 2024-06-23
  Filled 2024-05-27: qty 112, 28d supply, fill #0

## 2024-05-27 NOTE — Progress Notes (Signed)
 Specialty Pharmacy Refill Coordination Note  Carol Ortega is a 53 y.o. female contacted today regarding refills of specialty medication(s) Nilotinib  HCl (Tasigna )   Patient requested Delivery   Delivery date: 06/05/24   Verified address: [address]  **[phone]   Medication will be filled on 06/04/24. This fill date is pending response to refill request from provider. Patient is aware and if they have not received fill by intended date they must follow up with pharmacy.

## 2024-05-27 NOTE — Progress Notes (Signed)
 Specialty Pharmacy Ongoing Clinical Assessment Note  Carol Ortega is a 53 y.o. female who is being followed by the specialty pharmacy service for RxSp Oncology   Patient's specialty medication(s) reviewed today: Nilotinib  HCl (Tasigna )   Missed doses in the last 4 weeks: 2   Patient/Caregiver did not have any additional questions or concerns.   Therapeutic benefit summary: Patient is achieving benefit   Adverse events/side effects summary: No adverse events/side effects   Patient's therapy is appropriate to: Continue    Goals Addressed             This Visit's Progress    Achieve or maintain remission   On track    Patient is on track. Patient will maintain adherence. Per office visit on 03/10/24, patient's BCR-ABL PCR came down to 0.23% from prior value of 2.7%.           Follow up: 3 months  [facility]

## 2024-05-30 DIAGNOSIS — E538 Deficiency of other specified B group vitamins: Secondary | ICD-10-CM | POA: Diagnosis not present

## 2024-06-03 ENCOUNTER — Inpatient Hospital Stay

## 2024-06-03 DIAGNOSIS — C9210 Chronic myeloid leukemia, BCR/ABL-positive, not having achieved remission: Secondary | ICD-10-CM

## 2024-06-03 LAB — CBC WITH DIFFERENTIAL (CANCER CENTER ONLY)
Abs Immature Granulocytes: 0.01 10*3/uL (ref 0.00–0.07)
Basophils Absolute: 0.1 10*3/uL (ref 0.0–0.1)
Basophils Relative: 1 %
Eosinophils Absolute: 0.2 10*3/uL (ref 0.0–0.5)
Eosinophils Relative: 3 %
HCT: 41.5 % (ref 36.0–46.0)
Hemoglobin: 12.9 g/dL (ref 12.0–15.0)
Immature Granulocytes: 0 %
Lymphocytes Relative: 33 %
Lymphs Abs: 2.7 10*3/uL (ref 0.7–4.0)
MCH: 27.8 pg (ref 26.0–34.0)
MCHC: 31.1 g/dL (ref 30.0–36.0)
MCV: 89.4 fL (ref 80.0–100.0)
Monocytes Absolute: 0.7 10*3/uL (ref 0.1–1.0)
Monocytes Relative: 9 %
Neutro Abs: 4.6 10*3/uL (ref 1.7–7.7)
Neutrophils Relative %: 54 %
Platelet Count: 326 10*3/uL (ref 150–400)
RBC: 4.64 MIL/uL (ref 3.87–5.11)
RDW: 15 % (ref 11.5–15.5)
WBC Count: 8.3 10*3/uL (ref 4.0–10.5)
nRBC: 0 % (ref 0.0–0.2)

## 2024-06-03 LAB — CMP (CANCER CENTER ONLY)
ALT: 28 U/L (ref 0–44)
AST: 21 U/L (ref 15–41)
Albumin: 3.3 g/dL — ABNORMAL LOW (ref 3.5–5.0)
Alkaline Phosphatase: 86 U/L (ref 38–126)
Anion gap: 7 (ref 5–15)
BUN: 7 mg/dL (ref 6–20)
CO2: 26 mmol/L (ref 22–32)
Calcium: 8.9 mg/dL (ref 8.9–10.3)
Chloride: 105 mmol/L (ref 98–111)
Creatinine: 0.75 mg/dL (ref 0.44–1.00)
GFR, Estimated: >60 mL/min (ref >60)
Glucose, Bld: 100 mg/dL — ABNORMAL HIGH (ref 70–99)
Potassium: 4.1 mmol/L (ref 3.5–5.1)
Sodium: 138 mmol/L (ref 135–145)
Total Bilirubin: 1.1 mg/dL (ref 0.0–1.2)
Total Protein: 7.2 g/dL (ref 6.5–8.1)

## 2024-06-03 LAB — LIPID PANEL
Cholesterol: 255 mg/dL — ABNORMAL HIGH (ref 0–200)
HDL: 49 mg/dL (ref >40)
LDL Cholesterol: 186 mg/dL — ABNORMAL HIGH (ref 0–99)
Total CHOL/HDL Ratio: 5.2 ratio
Triglycerides: 98 mg/dL (ref <150)
VLDL: 20 mg/dL (ref 0–40)

## 2024-06-04 ENCOUNTER — Other Ambulatory Visit: Payer: Self-pay

## 2024-06-07 LAB — BCR-ABL1, CML/ALL, PCR, QUANT
E1A2 Transcript: <0.0032 %
b2a2 transcript: 0.1016 %
b3a2 transcript: <0.0032 %

## 2024-06-10 DIAGNOSIS — F4312 Post-traumatic stress disorder, chronic: Secondary | ICD-10-CM | POA: Diagnosis not present

## 2024-06-11 DIAGNOSIS — Z0000 Encounter for general adult medical examination without abnormal findings: Secondary | ICD-10-CM | POA: Diagnosis not present

## 2024-06-11 DIAGNOSIS — Z124 Encounter for screening for malignant neoplasm of cervix: Secondary | ICD-10-CM | POA: Diagnosis not present

## 2024-06-11 DIAGNOSIS — R7303 Prediabetes: Secondary | ICD-10-CM | POA: Diagnosis not present

## 2024-06-11 DIAGNOSIS — F411 Generalized anxiety disorder: Secondary | ICD-10-CM | POA: Diagnosis not present

## 2024-06-11 DIAGNOSIS — E213 Hyperparathyroidism, unspecified: Secondary | ICD-10-CM | POA: Diagnosis not present

## 2024-06-11 DIAGNOSIS — Z1231 Encounter for screening mammogram for malignant neoplasm of breast: Secondary | ICD-10-CM | POA: Diagnosis not present

## 2024-06-11 DIAGNOSIS — Z1211 Encounter for screening for malignant neoplasm of colon: Secondary | ICD-10-CM | POA: Diagnosis not present

## 2024-06-11 DIAGNOSIS — M2550 Pain in unspecified joint: Secondary | ICD-10-CM | POA: Diagnosis not present

## 2024-06-11 DIAGNOSIS — E538 Deficiency of other specified B group vitamins: Secondary | ICD-10-CM | POA: Diagnosis not present

## 2024-06-13 ENCOUNTER — Inpatient Hospital Stay (HOSPITAL_BASED_OUTPATIENT_CLINIC_OR_DEPARTMENT_OTHER): Admitting: Oncology

## 2024-06-13 ENCOUNTER — Encounter: Payer: Self-pay | Admitting: Oncology

## 2024-06-13 VITALS — BP 117/79 | HR 73 | Temp 98.3°F | Resp 18 | Ht 66.0 in | Wt 289.0 lb

## 2024-06-13 DIAGNOSIS — Z79899 Other long term (current) drug therapy: Secondary | ICD-10-CM

## 2024-06-13 DIAGNOSIS — C9210 Chronic myeloid leukemia, BCR/ABL-positive, not having achieved remission: Secondary | ICD-10-CM | POA: Diagnosis not present

## 2024-06-13 MED ORDER — METHOCARBAMOL 500 MG PO TABS: 500.0000 mg | ORAL_TABLET | Freq: Four times a day (QID) | ORAL | 0 refills | Status: DC

## 2024-06-13 MED ORDER — METHOCARBAMOL 500 MG PO TABS: 500.0000 mg | ORAL_TABLET | Freq: Four times a day (QID) | ORAL | 0 refills | Status: AC

## 2024-06-13 NOTE — Progress Notes (Signed)
 Patient slept wrong on her right arm about 2 nights ago and ever since she has been having severe pain in that arm, she wanted to know if she could get a muscle relaxer. Other than that she is doing ok.

## 2024-06-13 NOTE — Progress Notes (Signed)
 Hematology/Oncology Consult note [facility]  Telephone:([phone] [phone]  Patient Care Team: Joshua Santana CROME, NP as PCP - General (Nurse Practitioner) Melanee Annah BROCKS, MD as Consulting Physician (Oncology)   Name of the patient: Carol Ortega  [id number]  07/18/1971   Date of visit: 06/13/24  Diagnosis-chronic phase CML  Chief complaint/ Reason for visit-routine follow-up of CML on nilotinib   Heme/Onc history:  Patient is a 53 year old African-American femaleWho presents to the ER with symptoms of left-sided flank pain.  CT abdomen and pelvis without contrast showed moderate left hydronephrosis secondary to a 1.1 x 0.9 x 1.3 cm calculus at the left ureteropelvic junction/proximal left ureter.  No other evidence of adenopathy and spleen appeared normal.  Incidentally patient was noted to have a white cell count of 138 which on repeat check was 133.  Platelet counts normal and hemoglobin was 12.1.  Last normal CBC was in April 2021 when white count was 7.2.  Presently differential mainly shows neutrophilia along with lymphocytosis monocytosis basophilia and eosinophilia.  Immature granulocytes were seen.  Pathology smear review shows findings concerning for myeloproliferative neoplasm.  Blasts possibly less than 20% and BCR ABL testing was recommended.Patient noted to have wbc of 18 in may 2022 again with predominant left shift.   BCR ABL FISH testing showed 100% nuclei positive for BCR-ABL gene fusion signals.  BCR ABL PCR testing showed B2 A2 transcript 847.313 and B3 A2 transcript less than 0.0032%.  E1 A2 transcript 0.5645%.  Peripheral blood flow cytometry showedAberrant myeloblast population about 2% of the leukocytes.  Absolute neutrophilia eosinophilia and basophilia.  Patient started Dasatinib  in early December 2022. Bone marrow biopsy on 01/25/2021 showed hypercellular bone marrow with pan myeloid proliferation.  No increased number of blasts seen.   There was a concern for increasing BCR-ABL transcripts in April 2024.  Mutational analysis showedp.([id number]) of uncertain significance.Repeat bone marrow biopsy did not reveal any evidence of progression.  Orderly maturation noted in erythroid and granulocytic precursors.  Megakaryocytes slightly increased in number with focal clustering and slight hyper lobulation.  Subsequently BCR-ABL transcripts decreased to less than 1% and therefore patient was continued on Dasatinib .  Due to persistent increase in BCR-ABL transcripts to more than 1% patient was switched to nilotinib  in February 2025   Interval history-tolerating Nilotinib  without any significant side effects.  She recently had lipid panel checked by her primary care doctor and needs to follow-up with those results with her.  ECOG PS- 1 Pain scale- 0   Review of systems- Review of Systems  Constitutional:  Negative for chills, fever, malaise/fatigue and weight loss.  HENT:  Negative for congestion, ear discharge and nosebleeds.   Eyes:  Negative for blurred vision.  Respiratory:  Negative for cough, hemoptysis, sputum production, shortness of breath and wheezing.   Cardiovascular:  Negative for chest pain, palpitations, orthopnea and claudication.  Gastrointestinal:  Negative for abdominal pain, blood in stool, constipation, diarrhea, heartburn, melena, nausea and vomiting.  Genitourinary:  Negative for dysuria, flank pain, frequency, hematuria and urgency.  Musculoskeletal:  Negative for back pain, joint pain and myalgias.  Skin:  Negative for rash.  Neurological:  Negative for dizziness, tingling, focal weakness, seizures, weakness and headaches.  Endo/Heme/Allergies:  Does not bruise/bleed easily.  Psychiatric/Behavioral:  Negative for depression and suicidal ideas. The patient does not have insomnia.       Allergies  Allergen Reactions   Bee Venom Anaphylaxis   Egg-Derived Products Anaphylaxis   Other Anaphylaxis  Surgical dye.. Pt states ok to take if previously taken benadryl  or prednisone    Penicillins Hives and Other (See Comments)    Has patient had a PCN reaction causing immediate rash, facial/tongue/throat swelling, SOB or lightheadedness with hypotension: No Has patient had a PCN reaction causing severe rash involving mucus membranes or skin necrosis: No Has patient had a PCN reaction that required hospitalization No Has patient had a PCN reaction occurring within the last 10 years: No If all of the above answers are NO, then may proceed with Cephalosporin use.   Latex Rash   Oxycodone -Acetaminophen  Itching   Wellbutrin  [Bupropion ] Other (See Comments)    Irritiability     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   Complication of anesthesia 1999   lung collapse after surgery for gallbladder   Depression    Ectopic pregnancy    Headache    migraines   Hoarseness of voice    Hypertension    Kidney stone    Obesity    Parathyroid  abnormality (HCC)    Pulmonary emboli (HCC) 2011   Pulmonary embolism (HCC)    Radiation    Brain tumor   Thyroid  disease    UTI (urinary tract infection)    Vitamin D  deficiency      Past Surgical History:  Procedure Laterality Date   CHOLECYSTECTOMY  1999   CYSTOSCOPY W/ RETROGRADES Left 03/01/2018   Procedure: CYSTOSCOPY WITH RETROGRADE PYELOGRAM;  Surgeon: Twylla Glendia BROCKS, MD;  Location: [facility];  Service: Urology;  Laterality: Left;   CYSTOSCOPY W/ URETERAL STENT PLACEMENT Right 04/08/2017   Procedure: CYSTOSCOPY WITH RETROGRADE PYELOGRAM/URETERAL STENT PLACEMENT;  Surgeon: Alm Fragmin, MD;  Location: [facility];  Service: Urology;  Laterality: Right;   CYSTOSCOPY W/ URETERAL STENT PLACEMENT Left 03/12/2018   Procedure: CYSTOSCOPY WITH STENT REPLACEMENT;  Surgeon: Twylla Glendia BROCKS, MD;  Location: [facility];  Service: Urology;  Laterality: Left;   CYSTOSCOPY W/ URETEROSCOPY     CYSTOSCOPY WITH RETROGRADE PYELOGRAM, URETEROSCOPY AND STENT PLACEMENT Left 01/20/2014   Procedure: LEFT URETEROSCOPY WITH STENT PLACEMENT;  Surgeon: Norleen Seltzer, MD;  Location: [facility];  Service: Urology;  Laterality: Left;   CYSTOSCOPY WITH RETROGRADE PYELOGRAM, URETEROSCOPY AND STENT PLACEMENT Bilateral 04/23/2015   Procedure: CYSTOSCOPY WITH BILATERAL RETROGRADE PYELOGRAM, URETEROSCOPY AND STENT PLACEMENT;  Surgeon: Ricardo Likens, MD;  Location: [facility];  Service: Urology;  Laterality: Bilateral;   CYSTOSCOPY WITH RETROGRADE PYELOGRAM, URETEROSCOPY AND STENT PLACEMENT Right 04/13/2017   Procedure: CYSTOSCOPY WITH RETROGRADE PYELOGRAM, URETEROSCOPY AND STENT EXCHANGE;  Surgeon: Ricardo Likens, MD;  Location: [facility];  Service: Urology;  Laterality: Right;   CYSTOSCOPY WITH STENT PLACEMENT Left 01/12/2014   Procedure: CYSTOSCOPY WITH STENT PLACEMENT left retrograde;  Surgeon: Norleen Seltzer, MD;  Location: [facility];  Service: Urology;  Laterality: Left;   CYSTOSCOPY WITH STENT PLACEMENT Left 03/01/2018   Procedure: CYSTOSCOPY WITH STENT PLACEMENT;  Surgeon: Twylla Glendia BROCKS, MD;  Location: [facility];  Service: Urology;  Laterality: Left;   CYSTOSCOPY/URETEROSCOPY/HOLMIUM LASER/STENT PLACEMENT Left 03/12/2018   Procedure: CYSTOSCOPY/URETEROSCOPY/HOLMIUM LASER;  Surgeon: Twylla Glendia BROCKS, MD;  Location: [facility];  Service: Urology;  Laterality: Left;   CYSTOSCOPY/URETEROSCOPY/HOLMIUM LASER/STENT PLACEMENT Left 11/15/2021   Procedure: CYSTOSCOPY/URETEROSCOPY/HOLMIUM LASER/STENT PLACEMENT;  Surgeon: Twylla Glendia BROCKS, MD;  Location:  [facility];  Service: Urology;  Laterality: Left;   HOLMIUM LASER APPLICATION Left 01/20/2014   Procedure: HOLMIUM LASER APPLICATION;  Surgeon: Norleen Seltzer, MD;  Location: [facility];  Service: Urology;  Laterality: Left;   HOLMIUM LASER APPLICATION Bilateral 04/23/2015   Procedure: HOLMIUM LASER  APPLICATION;  Surgeon: Ricardo Likens, MD;  Location: [facility];  Service: Urology;  Laterality: Bilateral;   HOLMIUM LASER APPLICATION Right 04/13/2017   Procedure: HOLMIUM LASER APPLICATION;  Surgeon: Ricardo Likens, MD;  Location: [facility];  Service: Urology;  Laterality: Right;   IR BONE MARROW BIOPSY & ASPIRATION  03/27/2023   kidney stone removal     LITHOTRIPSY     PARATHYROIDECTOMY Right 11/13/2016   Procedure: RIGHT SUPERIOR PARATHYROIDECTOMY;  Surgeon: Krystal Spinner, MD;  Location: [facility];  Service: General;  Laterality: Right;   surgery for,ectopic pregnancy  2011    1 fallopian tube rupture   TUBAL LIGATION  2011    Social History   Socioeconomic History   Marital status: Divorced    Spouse name: Not on file   Number of children: 4   Years of education: Not on file   Highest education level: Some college, no degree  Occupational History   Not on file  Tobacco Use   Smoking status: Never   Smokeless tobacco: Never  Vaping Use   Vaping status: Never Used  Substance and Sexual Activity   Alcohol use: Not Currently   Drug use: Not Currently    Types: Marijuana   Sexual activity: Yes  Other Topics Concern   Not on file  Social History Narrative   Lives at home with her son, independent at baselinepend   Social Drivers of Health   Financial Resource Strain: High Risk (01/22/2024)   Received from [facility]   Overall Financial Resource Strain (CARDIA)    Difficulty of Paying Living Expenses: Hard  Food Insecurity: Food Insecurity Present (01/22/2024)   Received from [facility]   Hunger Vital Sign    Within the past 12 months, you worried that your food would run out before you got the money to buy more.: Sometimes true    Within the past 12 months, the food you bought just didn't last and you didn't have money to get more.: Sometimes true  Transportation Needs: No Transportation Needs (01/22/2024)   Received from [facility] - Transportation    In the past 12 months, has lack of transportation kept you from medical appointments or from getting medications?: No    Lack of Transportation (Non-Medical): No  Physical Activity: Inactive (09/11/2018)   Exercise Vital Sign    Days of Exercise per Week: 0 days    Minutes of Exercise per Session: 0 min  Stress: Stress Concern Present (09/11/2018)   [employer] of Occupational Health - Occupational Stress Questionnaire    Feeling of Stress : Very much  Social Connections: Moderately Isolated (09/11/2018)   Social Connection and Isolation Panel    Frequency of Communication with Friends and Family: More than three times a week    Frequency of Social Gatherings with Friends and Family: More than three times a week    Attends Religious Services: Never    Database administrator or Organizations: No    Attends Banker Meetings: Never    Marital Status: Divorced  Catering manager Violence: Not At Risk (09/11/2018)   Humiliation, Afraid, Rape, and Kick questionnaire  Fear of Current or Ex-Partner: No    Emotionally Abused: No    Physically Abused: No    Sexually Abused: No    Family History  Problem Relation Age of Onset   Colon polyps Mother    Bell's palsy Mother    Heart failure Mother    Hypertension Mother    Lupus Mother    Anxiety disorder Mother    Depression Mother    Stroke Mother    Asthma Father    Hypertension Father    Hyperlipidemia Father    Anxiety disorder Father    Depression Father    Anxiety disorder Sister    Depression Sister    Bipolar disorder Sister    Hypertension Maternal Grandmother    Diabetes Maternal Grandmother    Sarcoidosis Maternal Grandmother    Depression Son    Bipolar disorder Son    Colon cancer Neg Hx      Current Outpatient Medications:    acetaminophen  (TYLENOL ) 325 MG tablet, Take 650 mg by mouth every 6 (six) hours as needed for mild pain., Disp: , Rfl:    albuterol   (PROVENTIL ) (2.5 MG/3ML) 0.083% nebulizer solution, Take 3 mLs (2.5 mg total) by nebulization every 6 (six) hours as needed for wheezing or shortness of breath., Disp: 75 mL, Rfl: 12   alclomethasone (ACLOVATE) 0.05 % cream, Apply topically., Disp: , Rfl:    cyanocobalamin  (VITAMIN B12) 1000 MCG/ML injection, Inject 1,000 mcg into the muscle once., Disp: , Rfl:    dicyclomine  (BENTYL ) 10 MG capsule, Take 1 capsule (10 mg total) by mouth 4 (four) times daily -  before meals and at bedtime., Disp: 90 capsule, Rfl: 1   EPINEPHrine 0.3 mg/0.3 mL IJ SOAJ injection, Inject into the muscle., Disp: , Rfl:    hydrOXYzine  (ATARAX ) 10 MG tablet, Take 10 mg by mouth at bedtime., Disp: , Rfl:    nystatin  ointment (MYCOSTATIN ), Apply 1 Application topically 2 (two) times daily. Mix together with equal amount of triamcinolone  ointment and apply to affected area., Disp: 30 g, Rfl: 0   ondansetron  (ZOFRAN -ODT) 4 MG disintegrating tablet, Dis 1 T on the tongue every 6 hours prn, Disp: , Rfl:    Semaglutide-Weight Management (WEGOVY) 0.25 MG/0.5ML SOAJ, Inject into the skin., Disp: , Rfl:    TASIGNA  200 MG capsule, Take 2 capsules (400 mg total) by mouth every 12 (twelve) hours. Take on an empty stomach, at least one hour before or two hours after food., Disp: 112 capsule, Rfl: 0   triamcinolone  ointment (KENALOG ) 0.5 %, Apply 1 Application topically 2 (two) times daily. Mix with equal parts nystatin  ointment before applying., Disp: 30 g, Rfl: 0   VENTOLIN  HFA 108 (90 Base) MCG/ACT inhaler, Inhale 2 puffs into the lungs every 6 (six) hours as needed., Disp: , Rfl:    Vitamin D , Ergocalciferol , (DRISDOL ) 1.25 MG (50000 UNIT) CAPS capsule, Take 1 capsule by mouth once a week., Disp: , Rfl:    DULoxetine (CYMBALTA) 60 MG capsule, Take 1 capsule by mouth daily. (Patient not taking: Reported on 06/13/2024), Disp: , Rfl:    fluconazole  (DIFLUCAN ) 150 MG tablet, Take by mouth. (Patient not taking: Reported on 06/13/2024), Disp: ,  Rfl:    methocarbamol (ROBAXIN) 500 MG tablet, Take 1 tablet (500 mg total) by mouth 4 (four) times daily., Disp: 28 tablet, Rfl: 0  Physical exam:  Vitals:   06/13/24 0952  BP: 117/79  Pulse: 73  Resp: 18  Temp: 98.3 F (36.8 C)  TempSrc: Tympanic  SpO2: 99%  Weight: 289 lb (131.1 kg)  Height: 5' 6 (1.676 m)   Physical Exam  Cardiovascular:     Rate and Rhythm: Normal rate and regular rhythm.     Heart sounds: Normal heart sounds.  Pulmonary:     Effort: Pulmonary effort is normal.     Breath sounds: Normal breath sounds.  Abdominal:     General: Bowel sounds are normal.   Skin:    General: Skin is warm and dry.   Neurological:     Mental Status: She is alert and oriented to person, place, and time.      I have personally reviewed labs listed below:    Latest Ref Rng & Units 06/03/2024    9:26 AM  CMP  Glucose 70 - 99 mg/dL 899   BUN 6 - 20 mg/dL 7   Creatinine 9.55 - 8.99 mg/dL 9.24   Sodium 864 - 854 mmol/L 138   Potassium 3.5 - 5.1 mmol/L 4.1   Chloride 98 - 111 mmol/L 105   CO2 22 - 32 mmol/L 26   Calcium  8.9 - 10.3 mg/dL 8.9   Total Protein 6.5 - 8.1 g/dL 7.2   Total Bilirubin 0.0 - 1.2 mg/dL 1.1   Alkaline Phos 38 - 126 U/L 86   AST 15 - 41 U/L 21   ALT 0 - 44 U/L 28       Latest Ref Rng & Units 06/03/2024    9:26 AM  CBC  WBC 4.0 - 10.5 K/uL 8.3   Hemoglobin 12.0 - 15.0 g/dL 87.0   Hematocrit 63.9 - 46.0 % 41.5   Platelets 150 - 400 K/uL 326       Assessment and plan- Patient is a 53 y.o. female with history of chronic phase CML on nilotinib  here for routine follow-up    Patient was switched from Dasatinib  to nilotinib  in February 2025 due to persistence of BCR-ABL transcripts as well as progressive increase in the value over the last 1 year.  B2A2 transcript was 2.7% in January 2025 and since starting the [place] which has come down to 0.1%.  CBC and CMP are within normal limits.  Her recent lipid panel did show elevated LDL of 194 with a  total cholesterol of 262.  Nilotinib  can also cause hyper cholesterolemia and have asked her to get in touch with her primary care doctor and get started on cholesterol medication.  Overall she is responding to the erlotinib well which she will continue until progression or toxicity and I will see her back in 3 months with CBC with differential CMP and BCR-ABL PCR testing Visit Diagnosis 1. CML (chronic myelocytic leukemia) (HCC)   2. High risk medication use      Dr. Annah Skene, MD, MPH [facility] [phone] 06/13/2024 1:11 PM

## 2024-06-16 ENCOUNTER — Telehealth: Payer: Self-pay | Admitting: *Deleted

## 2024-06-16 DIAGNOSIS — I10 Essential (primary) hypertension: Secondary | ICD-10-CM

## 2024-06-16 NOTE — Progress Notes (Signed)
 Complex Care Management Note  Care Guide Note 06/16/2024 Name: Onetta Spainhower MRN: [id number] DOB: 1970-12-30  Lowanda Cashaw is a 53 y.o. year old female who sees Joshua Santana CROME, NP for primary care. I reached out to Kieth Hacker by phone today to offer complex care management services.  Ms. Modesitt was given information about Complex Care Management services today including:   The Complex Care Management services include support from the care team which includes your Nurse Care Manager, Clinical Social Worker, or Pharmacist.  The Complex Care Management team is here to help remove barriers to the health concerns and goals most important to you. Complex Care Management services are voluntary, and the patient may decline or stop services at any time by request to their care team member.   Complex Care Management Consent Status: Patient did not agree to participate in complex care management services at this time.  Follow up plan:  pcp is [facility] - pt aware to call and update health plan   Encounter Outcome:  Patient Refused  Thedford Franks, CMA [place]  [facility] Health Care Guide Direct Dial : [phone]  Fax: [phone] Website: [email or web address]

## 2024-06-19 ENCOUNTER — Encounter: Payer: Self-pay | Admitting: Nurse Practitioner

## 2024-06-23 ENCOUNTER — Other Ambulatory Visit: Payer: Self-pay | Admitting: Oncology

## 2024-06-23 ENCOUNTER — Other Ambulatory Visit: Payer: Self-pay

## 2024-06-23 DIAGNOSIS — C9210 Chronic myeloid leukemia, BCR/ABL-positive, not having achieved remission: Secondary | ICD-10-CM

## 2024-06-23 NOTE — Progress Notes (Signed)
 Specialty Pharmacy Refill Coordination Note  Carol Ortega is a 53 y.o. female contacted today regarding refills of specialty medication(s) Nilotinib  HCl (Tasigna )   Patient requested Delivery   Delivery date: 07/01/24   Verified address: [address]  **[phone]   Medication will be filled on 06/30/24.   This fill date is pending response to refill request from provider. Patient is aware and if they have not received fill by intended date they must follow up with pharmacy.

## 2024-06-24 ENCOUNTER — Telehealth: Payer: Self-pay

## 2024-06-24 ENCOUNTER — Other Ambulatory Visit: Payer: Self-pay

## 2024-06-24 DIAGNOSIS — Z1211 Encounter for screening for malignant neoplasm of colon: Secondary | ICD-10-CM

## 2024-06-24 MED ORDER — NA SULFATE-K SULFATE-MG SULF 17.5-3.13-1.6 GM/177ML PO SOLN: 1.0000 | Freq: Once | ORAL | 0 refills | Status: AC

## 2024-06-24 NOTE — Telephone Encounter (Signed)
 Gastroenterology Pre-Procedure Review  Request Date: 10/16 Requesting Physician: Dr. Jinny  PATIENT REVIEW QUESTIONS: The patient responded to the following health history questions as indicated:    1. Are you having any GI issues? no 2. Do you have a personal history of Polyps? no 3. Do you have a family history of Colon Cancer or Polyps? Mother colon polyps 4. Diabetes Mellitus? no 5. Joint replacements in the past 12 months?no 6. Major health problems in the past 3 months?no 7. Any artificial heart valves, MVP, or defibrillator?no 8. Weight mgmt meds? Yes Wegovy.  Has been advised to stop 7 days prior to colonoscopy    MEDICATIONS & ALLERGIES:    Patient reports the following regarding taking any anticoagulation/antiplatelet therapy:   Plavix, Coumadin, Eliquis, Xarelto , Lovenox , Pradaxa, Brilinta, or Effient? no Aspirin? no  Patient confirms/reports the following medications:  Current Outpatient Medications  Medication Sig Dispense Refill   acetaminophen  (TYLENOL ) 325 MG tablet Take 650 mg by mouth every 6 (six) hours as needed for mild pain.     albuterol  (PROVENTIL ) (2.5 MG/3ML) 0.083% nebulizer solution Take 3 mLs (2.5 mg total) by nebulization every 6 (six) hours as needed for wheezing or shortness of breath. 75 mL 12   alclomethasone (ACLOVATE) 0.05 % cream Apply topically.     cyanocobalamin  (VITAMIN B12) 1000 MCG/ML injection Inject 1,000 mcg into the muscle once.     dicyclomine  (BENTYL ) 10 MG capsule Take 1 capsule (10 mg total) by mouth 4 (four) times daily -  before meals and at bedtime. 90 capsule 1   DULoxetine (CYMBALTA) 60 MG capsule Take 1 capsule by mouth daily. (Patient not taking: Reported on 06/13/2024)     EPINEPHrine 0.3 mg/0.3 mL IJ SOAJ injection Inject into the muscle.     fluconazole  (DIFLUCAN ) 150 MG tablet Take by mouth. (Patient not taking: Reported on 06/13/2024)     hydrOXYzine  (ATARAX ) 10 MG tablet Take 10 mg by mouth at bedtime.     methocarbamol   (ROBAXIN ) 500 MG tablet Take 1 tablet (500 mg total) by mouth 4 (four) times daily. 28 tablet 0   nystatin  ointment (MYCOSTATIN ) Apply 1 Application topically 2 (two) times daily. Mix together with equal amount of triamcinolone  ointment and apply to affected area. 30 g 0   ondansetron  (ZOFRAN -ODT) 4 MG disintegrating tablet Dis 1 T on the tongue every 6 hours prn     Semaglutide-Weight Management (WEGOVY) 0.25 MG/0.5ML SOAJ Inject into the skin.     TASIGNA  200 MG capsule Take 2 capsules (400 mg total) by mouth every 12 (twelve) hours. Take on an empty stomach, at least one hour before or two hours after food. 112 capsule 0   triamcinolone  ointment (KENALOG ) 0.5 % Apply 1 Application topically 2 (two) times daily. Mix with equal parts nystatin  ointment before applying. 30 g 0   VENTOLIN  HFA 108 (90 Base) MCG/ACT inhaler Inhale 2 puffs into the lungs every 6 (six) hours as needed.     Vitamin D , Ergocalciferol , (DRISDOL ) 1.25 MG (50000 UNIT) CAPS capsule Take 1 capsule by mouth once a week.     No current facility-administered medications for this visit.    Patient confirms/reports the following allergies:  Allergies  Allergen Reactions   Bee Venom Anaphylaxis   Egg-Derived Products Anaphylaxis   Other Anaphylaxis    Surgical dye.. Pt states ok to take if previously taken benadryl  or prednisone    Penicillins Hives and Other (See Comments)    Has patient had a PCN reaction causing  immediate rash, facial/tongue/throat swelling, SOB or lightheadedness with hypotension: No Has patient had a PCN reaction causing severe rash involving mucus membranes or skin necrosis: No Has patient had a PCN reaction that required hospitalization No Has patient had a PCN reaction occurring within the last 10 years: No If all of the above answers are NO, then may proceed with Cephalosporin use.   Latex Rash   Oxycodone -Acetaminophen  Itching   Wellbutrin  [Bupropion ] Other (See Comments)    Irritiability     No orders of the defined types were placed in this encounter.   AUTHORIZATION INFORMATION Primary Insurance: 1D#: Group #:  Secondary Insurance: 1D#: Group #:  SCHEDULE INFORMATION: Date: 10/02/24 Time: Location: [facility]

## 2024-06-26 ENCOUNTER — Other Ambulatory Visit (HOSPITAL_COMMUNITY): Payer: Self-pay

## 2024-06-26 MED ORDER — TASIGNA 200 MG PO CAPS
400.0000 mg | ORAL_CAPSULE | Freq: Two times a day (BID) | ORAL | 0 refills | Status: DC
  Filled 2024-06-26: qty 112, 28d supply, fill #0

## 2024-06-27 ENCOUNTER — Other Ambulatory Visit: Payer: Self-pay

## 2024-07-08 NOTE — Telephone Encounter (Signed)
 Contacted patient to reschedule her colonoscopy due to Dr. Felicitas scope schedule being full (his preference is 4-5 procedures).  I asked patient if she would be okay with moving her procedure up to Wed 10/01/24 with a physician change to Dr. Marinda.  She said she is okay with her colonoscopy being moved from 10/02/24 with Dr. Jinny to 10/01/24 with Dr. Marinda.  Thanked her for her understanding.  Referral updated.  Instructions updated. Trish notified.  Rosaline, CMA

## 2024-07-21 ENCOUNTER — Other Ambulatory Visit: Payer: Self-pay

## 2024-07-21 ENCOUNTER — Other Ambulatory Visit (HOSPITAL_COMMUNITY): Payer: Self-pay

## 2024-07-21 ENCOUNTER — Other Ambulatory Visit: Payer: Self-pay | Admitting: Oncology

## 2024-07-21 DIAGNOSIS — C9210 Chronic myeloid leukemia, BCR/ABL-positive, not having achieved remission: Secondary | ICD-10-CM

## 2024-07-21 MED ORDER — TASIGNA 200 MG PO CAPS
400.0000 mg | ORAL_CAPSULE | Freq: Two times a day (BID) | ORAL | 0 refills | Status: DC
  Filled 2024-07-21: qty 120, 30d supply, fill #0
  Filled 2024-07-23: qty 112, 28d supply, fill #0
  Filled 2024-08-12: qty 120, 30d supply, fill #0

## 2024-07-23 ENCOUNTER — Other Ambulatory Visit (HOSPITAL_COMMUNITY): Payer: Self-pay

## 2024-07-23 ENCOUNTER — Other Ambulatory Visit: Payer: Self-pay

## 2024-07-25 ENCOUNTER — Other Ambulatory Visit: Payer: Self-pay

## 2024-08-07 DIAGNOSIS — F4312 Post-traumatic stress disorder, chronic: Secondary | ICD-10-CM | POA: Diagnosis not present

## 2024-08-08 ENCOUNTER — Telehealth: Payer: Self-pay | Admitting: *Deleted

## 2024-08-08 DIAGNOSIS — R102 Pelvic and perineal pain: Secondary | ICD-10-CM | POA: Diagnosis not present

## 2024-08-08 DIAGNOSIS — N760 Acute vaginitis: Secondary | ICD-10-CM | POA: Diagnosis not present

## 2024-08-08 DIAGNOSIS — J0101 Acute recurrent maxillary sinusitis: Secondary | ICD-10-CM | POA: Diagnosis not present

## 2024-08-08 DIAGNOSIS — N950 Postmenopausal bleeding: Secondary | ICD-10-CM | POA: Diagnosis not present

## 2024-08-08 DIAGNOSIS — E782 Mixed hyperlipidemia: Secondary | ICD-10-CM | POA: Diagnosis not present

## 2024-08-08 NOTE — Telephone Encounter (Signed)
 The patient went to her PCP and she told me that she got 3 prescriptions there and she wanted to make sure it is okay to take 2 meds while she taking .  [name]  The first 1 was Diflucan , on doxycycline, and her atorvastatin. I called Dr. Melanee she looked up all the medicines and yes they are all okay to take while she is taking Tasigna .  Patient is okay that

## 2024-08-12 ENCOUNTER — Other Ambulatory Visit: Payer: Self-pay

## 2024-08-12 ENCOUNTER — Other Ambulatory Visit: Payer: Self-pay | Admitting: Pharmacist

## 2024-08-12 DIAGNOSIS — C9210 Chronic myeloid leukemia, BCR/ABL-positive, not having achieved remission: Secondary | ICD-10-CM

## 2024-08-12 MED ORDER — NILOTINIB HCL 200 MG PO CAPS
400.0000 mg | ORAL_CAPSULE | Freq: Two times a day (BID) | ORAL | 1 refills | Status: DC
  Filled 2024-08-12: qty 120, 30d supply, fill #0

## 2024-08-12 MED ORDER — NILOTINIB HCL 200 MG PO CAPS
400.0000 mg | ORAL_CAPSULE | Freq: Two times a day (BID) | ORAL | 1 refills | Status: DC
  Filled 2024-08-12: qty 112, 28d supply, fill #0
  Filled 2024-09-15 – 2024-10-08 (×3): qty 112, 28d supply, fill #1

## 2024-08-12 NOTE — Progress Notes (Signed)
 Generic preferred by pharmacy, rx resent for generic

## 2024-08-12 NOTE — Addendum Note (Signed)
 Addended by: RODGERS RENAEE SAILOR on: 08/12/2024 03:37 PM   Modules accepted: Orders

## 2024-08-12 NOTE — Progress Notes (Signed)
 Specialty Pharmacy Refill Coordination Note  Carol Ortega is a 53 y.o. female contacted today regarding refills of specialty medication(s) Nilotinib  HCl (Tasigna )   Patient requested Delivery   Delivery date: 08/22/24   Verified address: [address]  **[phone]   Medication will be filled on 08/21/24.

## 2024-08-12 NOTE — Progress Notes (Signed)
 Specialty Pharmacy Ongoing Clinical Assessment Note  Carol Ortega is a 53 y.o. female who is being followed by the specialty pharmacy service for RxSp Oncology   Patient's specialty medication(s) reviewed today: Nilotinib  HCl (Tasigna )   Missed doses in the last 4 weeks: 0   Patient/Caregiver did not have any additional questions or concerns.   Therapeutic benefit summary: Patient is achieving benefit   Adverse events/side effects summary: No adverse events/side effects   Patient's therapy is appropriate to: Continue    Goals Addressed             This Visit's Progress    Achieve or maintain remission   On track    Patient is on track. Patient will maintain adherence. Per office visit on 06/13/24, patient continues to respond to therapy and her B2A2 transcript is now down to 0.1%.           Follow up: 3 months  Silvano LOISE Dolly Specialty Pharmacist    Clinical Intervention Note  Clinical Intervention Notes: The combination of metronidazole and Diflucan  with Tasigna  may increase the risk of QT interval prolongation. The patient is currently prescribed metronidazole for 7 days and does not intend to take Diflucan  unless a yeast infection develops after completing the antibiotic course. I reviewed the risk of QT prolongation with her, including symptoms to monitor and report immediately. She demonstrated understanding.  I also recommended that she start Florastor, a probiotic, while on her antibiotics and an additional 7 days after finishing treatment to help decrease upset stomach.   Clinical Intervention Outcomes: Prevention of an adverse drug event   Silvano LOISE Dolly Karel Santa

## 2024-08-21 ENCOUNTER — Other Ambulatory Visit: Payer: Self-pay

## 2024-09-10 ENCOUNTER — Other Ambulatory Visit (HOSPITAL_COMMUNITY): Payer: Self-pay

## 2024-09-10 DIAGNOSIS — N950 Postmenopausal bleeding: Secondary | ICD-10-CM | POA: Diagnosis not present

## 2024-09-12 ENCOUNTER — Inpatient Hospital Stay: Attending: Oncology

## 2024-09-12 DIAGNOSIS — C9210 Chronic myeloid leukemia, BCR/ABL-positive, not having achieved remission: Secondary | ICD-10-CM | POA: Diagnosis present

## 2024-09-12 DIAGNOSIS — Z79899 Other long term (current) drug therapy: Secondary | ICD-10-CM | POA: Insufficient documentation

## 2024-09-12 DIAGNOSIS — E785 Hyperlipidemia, unspecified: Secondary | ICD-10-CM | POA: Diagnosis not present

## 2024-09-12 LAB — CBC WITH DIFFERENTIAL (CANCER CENTER ONLY)
Abs Immature Granulocytes: 0.02 K/uL (ref 0.00–0.07)
Basophils Absolute: 0.1 K/uL (ref 0.0–0.1)
Basophils Relative: 1 %
Eosinophils Absolute: 0.3 K/uL (ref 0.0–0.5)
Eosinophils Relative: 3 %
HCT: 40.9 % (ref 36.0–46.0)
Hemoglobin: 13 g/dL (ref 12.0–15.0)
Immature Granulocytes: 0 %
Lymphocytes Relative: 28 %
Lymphs Abs: 2.5 K/uL (ref 0.7–4.0)
MCH: 27.8 pg (ref 26.0–34.0)
MCHC: 31.8 g/dL (ref 30.0–36.0)
MCV: 87.6 fL (ref 80.0–100.0)
Monocytes Absolute: 0.9 K/uL (ref 0.1–1.0)
Monocytes Relative: 10 %
Neutro Abs: 5.2 K/uL (ref 1.7–7.7)
Neutrophils Relative %: 58 %
Platelet Count: 298 K/uL (ref 150–400)
RBC: 4.67 MIL/uL (ref 3.87–5.11)
RDW: 14.5 % (ref 11.5–15.5)
WBC Count: 9 K/uL (ref 4.0–10.5)
nRBC: 0 % (ref 0.0–0.2)

## 2024-09-12 LAB — CMP (CANCER CENTER ONLY)
ALT: 20 U/L (ref 0–44)
AST: 19 U/L (ref 15–41)
Albumin: 3.4 g/dL — ABNORMAL LOW (ref 3.5–5.0)
Alkaline Phosphatase: 79 U/L (ref 38–126)
Anion gap: 7 (ref 5–15)
BUN: 8 mg/dL (ref 6–20)
CO2: 27 mmol/L (ref 22–32)
Calcium: 8.9 mg/dL (ref 8.9–10.3)
Chloride: 105 mmol/L (ref 98–111)
Creatinine: 0.68 mg/dL (ref 0.44–1.00)
GFR, Estimated: >60 mL/min (ref >60)
Glucose, Bld: 99 mg/dL (ref 70–99)
Potassium: 3.6 mmol/L (ref 3.5–5.1)
Sodium: 139 mmol/L (ref 135–145)
Total Bilirubin: 0.7 mg/dL (ref 0.0–1.2)
Total Protein: 7.4 g/dL (ref 6.5–8.1)

## 2024-09-15 ENCOUNTER — Other Ambulatory Visit: Payer: Self-pay

## 2024-09-17 ENCOUNTER — Other Ambulatory Visit: Payer: Self-pay

## 2024-09-18 LAB — BCR-ABL1, CML/ALL, PCR, QUANT
E1A2 Transcript: <0.0032 %
b2a2 transcript: 0.1154 %
b3a2 transcript: <0.0032 %

## 2024-09-26 ENCOUNTER — Encounter: Payer: Self-pay | Admitting: Oncology

## 2024-09-26 ENCOUNTER — Inpatient Hospital Stay: Attending: Oncology | Admitting: Oncology

## 2024-09-26 VITALS — BP 123/81 | HR 77 | Temp 96.0°F | Resp 19 | Ht 66.0 in | Wt 282.0 lb

## 2024-09-26 DIAGNOSIS — C9210 Chronic myeloid leukemia, BCR/ABL-positive, not having achieved remission: Secondary | ICD-10-CM | POA: Diagnosis not present

## 2024-09-26 DIAGNOSIS — Z79899 Other long term (current) drug therapy: Secondary | ICD-10-CM | POA: Diagnosis not present

## 2024-09-26 NOTE — Progress Notes (Signed)
 Hematology/Oncology Consult note [facility]  Telephone:([phone] [phone]  Patient Care Team: Joshua Santana CROME, NP as PCP - General (Nurse Practitioner) Melanee Annah BROCKS, MD as Consulting Physician (Oncology)   Name of the patient: Carol Ortega  [id number]  1971-03-20   Date of visit: 09/26/24  Diagnosis-chronic phase CML  Chief complaint/ Reason for visit-routine follow-up of CML presently on melatonin  Heme/Onc history: Patient is a 53 year old African-American femaleWho presents to the ER with symptoms of left-sided flank pain.  CT abdomen and pelvis without contrast showed moderate left hydronephrosis secondary to a 1.1 x 0.9 x 1.3 cm calculus at the left ureteropelvic junction/proximal left ureter.  No other evidence of adenopathy and spleen appeared normal.  Incidentally patient was noted to have a white cell count of 138 which on repeat check was 133.  Platelet counts normal and hemoglobin was 12.1.  Last normal CBC was in April 2021 when white count was 7.2.  Presently differential mainly shows neutrophilia along with lymphocytosis monocytosis basophilia and eosinophilia.  Immature granulocytes were seen.  Pathology smear review shows findings concerning for myeloproliferative neoplasm.  Blasts possibly less than 20% and BCR ABL testing was recommended.Patient noted to have wbc of 18 in may 2022 again with predominant left shift.   BCR ABL FISH testing showed 100% nuclei positive for BCR-ABL gene fusion signals.  BCR ABL PCR testing showed B2 A2 transcript 847.313 and B3 A2 transcript less than 0.0032%.  E1 A2 transcript 0.5645%.  Peripheral blood flow cytometry showedAberrant myeloblast population about 2% of the leukocytes.  Absolute neutrophilia eosinophilia and basophilia.  Patient started Dasatinib  in early December 2022. Bone marrow biopsy on 01/25/2021 showed hypercellular bone marrow with pan myeloid proliferation.  No increased number of  blasts seen.  There was a concern for increasing BCR-ABL transcripts in April 2024.  Mutational analysis showedp.([id number]) of uncertain significance.Repeat bone marrow biopsy did not reveal any evidence of progression.  Orderly maturation noted in erythroid and granulocytic precursors.  Megakaryocytes slightly increased in number with focal clustering and slight hyper lobulation.  Subsequently BCR-ABL transcripts decreased to less than 1% and therefore patient was continued on Dasatinib .  Due to persistent increase in BCR-ABL transcripts to more than 1% patient was switched to nilotinib  in February 2025   Interval history- Carol Ortega is a 53 year old female with chronic myeloid leukemia who presents for follow-up of her treatment response.  She was switched from dasatinib  to nilotinib  in February 2025 due to an increase in her BCR-ABL PCR numbers to 2.7% in January 2025. Since the switch, her BCR-ABL PCR levels have decreased to 0.2%, 0.101%, and currently 0.11%, all remaining below 1%. She has experienced no issues with nilotinib , including no nausea, vomiting, or diarrhea, and has been taking it regularly except for a few missed doses while on antibiotics for vaginal spotting. An endometrial biopsy showed no abnormalities, and a water ultrasound is scheduled to rule out polyps.  She was started on pravastatin after her physical in June 2025 due to an LDL level of 194. Her cholesterol levels will be re-evaluated in three months.   ECOG PS- 0 Pain scale- 0   Review of systems- Review of Systems  Constitutional:  Negative for chills, fever, malaise/fatigue and weight loss.  HENT:  Negative for congestion, ear discharge and nosebleeds.   Eyes:  Negative for blurred vision.  Respiratory:  Negative for cough, hemoptysis, sputum production, shortness of breath and wheezing.   Cardiovascular:  Negative for chest pain, palpitations, orthopnea and claudication.  Gastrointestinal:  Negative  for abdominal pain, blood in stool, constipation, diarrhea, heartburn, melena, nausea and vomiting.  Genitourinary:  Negative for dysuria, flank pain, frequency, hematuria and urgency.  Musculoskeletal:  Negative for back pain, joint pain and myalgias.  Skin:  Negative for rash.  Neurological:  Negative for dizziness, tingling, focal weakness, seizures, weakness and headaches.  Endo/Heme/Allergies:  Does not bruise/bleed easily.  Psychiatric/Behavioral:  Negative for depression and suicidal ideas. The patient does not have insomnia.       Allergies  Allergen Reactions   Bee Venom Anaphylaxis   Egg Protein-Containing Drug Products Anaphylaxis   Other Anaphylaxis    Surgical dye.. Pt states ok to take if previously taken benadryl  or prednisone    Penicillins Hives and Other (See Comments)    Has patient had a PCN reaction causing immediate rash, facial/tongue/throat swelling, SOB or lightheadedness with hypotension: No Has patient had a PCN reaction causing severe rash involving mucus membranes or skin necrosis: No Has patient had a PCN reaction that required hospitalization No Has patient had a PCN reaction occurring within the last 10 years: No If all of the above answers are NO, then may proceed with Cephalosporin use.   Latex Rash   Oxycodone -Acetaminophen  Itching   Wellbutrin  [Bupropion ] Other (See Comments)    Irritiability     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   Complication of anesthesia 1999   lung collapse after surgery for gallbladder   Depression    Ectopic pregnancy    Headache    migraines   Hoarseness of voice    Hypertension    Kidney stone    Obesity    Parathyroid  abnormality    Pulmonary emboli (HCC) 2011   Pulmonary embolism (HCC)    Radiation    Brain tumor   Thyroid  disease     UTI (urinary tract infection)    Vitamin D  deficiency      Past Surgical History:  Procedure Laterality Date   CHOLECYSTECTOMY  1999   CYSTOSCOPY W/ RETROGRADES Left 03/01/2018   Procedure: CYSTOSCOPY WITH RETROGRADE PYELOGRAM;  Surgeon: Twylla Glendia BROCKS, MD;  Location: [facility];  Service: Urology;  Laterality: Left;   CYSTOSCOPY W/ URETERAL STENT PLACEMENT Right 04/08/2017   Procedure: CYSTOSCOPY WITH RETROGRADE PYELOGRAM/URETERAL STENT PLACEMENT;  Surgeon: Alm Fragmin, MD;  Location: [facility];  Service: Urology;  Laterality: Right;   CYSTOSCOPY W/ URETERAL STENT PLACEMENT Left 03/12/2018   Procedure: CYSTOSCOPY WITH STENT REPLACEMENT;  Surgeon: Twylla Glendia BROCKS, MD;  Location: [facility];  Service: Urology;  Laterality: Left;   CYSTOSCOPY W/ URETEROSCOPY     CYSTOSCOPY WITH RETROGRADE PYELOGRAM, URETEROSCOPY AND STENT PLACEMENT Left 01/20/2014   Procedure: LEFT URETEROSCOPY WITH STENT PLACEMENT;  Surgeon: Norleen Seltzer, MD;  Location: [facility];  Service: Urology;  Laterality: Left;   CYSTOSCOPY WITH RETROGRADE PYELOGRAM, URETEROSCOPY AND STENT PLACEMENT Bilateral 04/23/2015   Procedure: CYSTOSCOPY WITH BILATERAL RETROGRADE PYELOGRAM, URETEROSCOPY AND STENT PLACEMENT;  Surgeon: Ricardo Likens, MD;  Location: [facility];  Service: Urology;  Laterality: Bilateral;   CYSTOSCOPY WITH RETROGRADE PYELOGRAM, URETEROSCOPY AND STENT PLACEMENT Right 04/13/2017   Procedure: CYSTOSCOPY WITH RETROGRADE PYELOGRAM, URETEROSCOPY AND STENT EXCHANGE;  Surgeon: Ricardo Likens, MD;  Location: [facility];  Service: Urology;  Laterality: Right;   CYSTOSCOPY WITH STENT PLACEMENT Left 01/12/2014   Procedure: CYSTOSCOPY WITH STENT PLACEMENT left retrograde;  Surgeon: Norleen Seltzer, MD;  Location: [facility];  Service: Urology;  Laterality: Left;   CYSTOSCOPY WITH STENT PLACEMENT Left 03/01/2018   Procedure: CYSTOSCOPY WITH STENT PLACEMENT;  Surgeon: Twylla Glendia BROCKS, MD;  Location: [facility];  Service: Urology;  Laterality: Left;    CYSTOSCOPY/URETEROSCOPY/HOLMIUM LASER/STENT PLACEMENT Left 03/12/2018   Procedure: CYSTOSCOPY/URETEROSCOPY/HOLMIUM LASER;  Surgeon: Twylla Glendia BROCKS, MD;  Location: [facility];  Service: Urology;  Laterality: Left;   CYSTOSCOPY/URETEROSCOPY/HOLMIUM LASER/STENT PLACEMENT Left 11/15/2021   Procedure: CYSTOSCOPY/URETEROSCOPY/HOLMIUM LASER/STENT PLACEMENT;  Surgeon: Twylla Glendia BROCKS, MD;  Location: [facility];  Service: Urology;  Laterality: Left;   HOLMIUM LASER APPLICATION Left 01/20/2014   Procedure: HOLMIUM LASER APPLICATION;  Surgeon: Norleen Seltzer, MD;  Location: [facility];  Service: Urology;  Laterality: Left;   HOLMIUM LASER APPLICATION Bilateral 04/23/2015   Procedure: HOLMIUM LASER APPLICATION;  Surgeon: Ricardo Likens, MD;  Location: [facility];  Service: Urology;  Laterality: Bilateral;   HOLMIUM LASER APPLICATION Right 04/13/2017   Procedure: HOLMIUM LASER APPLICATION;  Surgeon: Ricardo Likens, MD;  Location: [facility];  Service: Urology;  Laterality: Right;   IR BONE MARROW BIOPSY & ASPIRATION  03/27/2023   kidney stone removal     LITHOTRIPSY     PARATHYROIDECTOMY Right 11/13/2016   Procedure: RIGHT SUPERIOR PARATHYROIDECTOMY;  Surgeon: Krystal Spinner, MD;  Location: [facility];  Service: General;  Laterality: Right;   surgery for,ectopic pregnancy  2011    1 fallopian tube rupture   TUBAL LIGATION  2011    Social History   Socioeconomic History   Marital status: Divorced    Spouse name: Not on file   Number of children: 4   Years of education: Not on file   Highest education level: Some college, no degree  Occupational History   Not on file  Tobacco Use   Smoking status: Never   Smokeless tobacco: Never  Vaping Use   Vaping status: Never Used  Substance and Sexual Activity   Alcohol use: Not Currently   Drug use: Not Currently    Types: Marijuana   Sexual activity: Yes  Other Topics Concern   Not on file  Social History Narrative   Lives at home with her son, independent at baselinepend    Social Drivers of Health   Financial Resource Strain: High Risk (01/22/2024)   Received from [facility]   Overall Financial Resource Strain (CARDIA)    Difficulty of Paying Living Expenses: Hard  Food Insecurity: Food Insecurity Present (01/22/2024)   Received from [facility]   Hunger Vital Sign    Within the past 12 months, you worried that your food would run out before you got the money to buy more.: Sometimes true    Within the past 12 months, the food you bought just didn't last and you didn't have money to get more.: Sometimes true  Transportation Needs: No Transportation Needs (01/22/2024)   Received [facility] - Transportation    In the past 12 months, has lack of transportation kept you from medical appointments or from getting medications?: No    Lack of Transportation (Non-Medical): No  Physical Activity: Inactive (09/11/2018)   Exercise Vital Sign    Days of Exercise per Week: 0 days    Minutes of Exercise per Session: 0 min  Stress: Stress Concern Present (09/11/2018)  [employer] of Occupational [employer] - Occupational Stress Questionnaire    Feeling of Stress : Very much  Social Connections: Moderately Isolated (09/11/2018)   Social Connection and Isolation Panel    Frequency of Communication with Friends and Family: More than three times a week    Frequency of Social Gatherings with Friends and Family: More than three times a week    Attends Religious Services: Never    Database administrator or Organizations: No    Attends Banker Meetings: Never    Marital Status: Divorced  Catering manager Violence: Not At Risk (09/11/2018)   Humiliation, Afraid, Rape, and Kick questionnaire    Fear of Current or Ex-Partner: No    Emotionally Abused: No    Physically Abused: No    Sexually Abused: No    Family History  Problem Relation Age of Onset   Colon polyps Mother    Bell's palsy  Mother    Heart failure Mother    Hypertension Mother    Lupus Mother    Anxiety disorder Mother    Depression Mother    Stroke Mother    Asthma Father    Hypertension Father    Hyperlipidemia Father    Anxiety disorder Father    Depression Father    Anxiety disorder Sister    Depression Sister    Bipolar disorder Sister    Hypertension Maternal Grandmother    Diabetes Maternal Grandmother    Sarcoidosis Maternal Grandmother    Depression Son    Bipolar disorder Son    Colon cancer Neg Hx      Current Outpatient Medications:    acetaminophen  (TYLENOL ) 325 MG tablet, Take 650 mg by mouth every 6 (six) hours as needed for mild pain., Disp: , Rfl:    albuterol  (PROVENTIL ) (2.5 MG/3ML) 0.083% nebulizer solution, Take 3 mLs (2.5 mg total) by nebulization every 6 (six) hours as needed for wheezing or shortness of breath., Disp: 75 mL, Rfl: 12   alclomethasone (ACLOVATE) 0.05 % cream, Apply topically., Disp: , Rfl:    dicyclomine  (BENTYL ) 10 MG capsule, Take 1 capsule (10 mg total) by mouth 4 (four) times daily -  before meals and at bedtime., Disp: 90 capsule, Rfl: 1   EPINEPHrine 0.3 mg/0.3 mL IJ SOAJ injection, Inject into the muscle., Disp: , Rfl:    hydrOXYzine  (ATARAX ) 10 MG tablet, Take 10 mg by mouth at bedtime., Disp: , Rfl:    methocarbamol  (ROBAXIN ) 500 MG tablet, Take 1 tablet (500 mg total) by mouth 4 (four) times daily., Disp: 28 tablet, Rfl: 0   nilotinib  (TASIGNA ) 200 MG capsule, Take 2 capsules (400 mg total) by mouth every 12 (twelve) hours. Take on an empty stomach, at least one hour before or two hours after food., Disp: 120 capsule, Rfl: 1   nystatin  ointment (MYCOSTATIN ), Apply 1 Application topically 2 (two) times daily. Mix together with equal amount of triamcinolone  ointment and apply to affected area., Disp: 30 g, Rfl: 0   ondansetron  (ZOFRAN -ODT) 4 MG disintegrating tablet, Dis 1 T on the tongue every 6 hours prn, Disp: , Rfl:    pravastatin (PRAVACHOL) 10 MG  tablet, Take 10 mg by mouth daily., Disp: , Rfl:    Semaglutide-Weight Management (WEGOVY) 0.25 MG/0.5ML SOAJ, Inject into the skin., Disp: , Rfl:    VENTOLIN  HFA 108 (90 Base) MCG/ACT inhaler, Inhale 2 puffs into the lungs every 6 (six) hours as needed., Disp: , Rfl:    Vitamin D , Ergocalciferol , (  DRISDOL ) 1.25 MG (50000 UNIT) CAPS capsule, Take 1 capsule by mouth once a week., Disp: , Rfl:    DULoxetine (CYMBALTA) 60 MG capsule, Take 1 capsule by mouth daily. (Patient not taking: Reported on 06/24/2024), Disp: , Rfl:    fluconazole  (DIFLUCAN ) 150 MG tablet, Take by mouth. (Patient not taking: Reported on 06/24/2024), Disp: , Rfl:    triamcinolone  ointment (KENALOG ) 0.5 %, Apply 1 Application topically 2 (two) times daily. Mix with equal parts nystatin  ointment before applying., Disp: 30 g, Rfl: 0  Physical exam: There were no vitals filed for this visit. Physical Exam Cardiovascular:     Rate and Rhythm: Normal rate and regular rhythm.     Heart sounds: Normal heart sounds.  Pulmonary:     Effort: Pulmonary effort is normal.     Breath sounds: Normal breath sounds.  Abdominal:     General: Bowel sounds are normal.     Palpations: Abdomen is soft.  Skin:    General: Skin is warm and dry.  Neurological:     Mental Status: She is alert and oriented to person, place, and time.      I have personally reviewed labs listed below:    Latest Ref Rng & Units 09/12/2024    9:57 AM  CMP  Glucose 70 - 99 mg/dL 99   BUN 6 - 20 mg/dL 8   Creatinine 9.55 - 8.99 mg/dL 9.31   Sodium 864 - 854 mmol/L 139   Potassium 3.5 - 5.1 mmol/L 3.6   Chloride 98 - 111 mmol/L 105   CO2 22 - 32 mmol/L 27   Calcium  8.9 - 10.3 mg/dL 8.9   Total Protein 6.5 - 8.1 g/dL 7.4   Total Bilirubin 0.0 - 1.2 mg/dL 0.7   Alkaline Phos 38 - 126 U/L 79   AST 15 - 41 U/L 19   ALT 0 - 44 U/L 20       Latest Ref Rng & Units 09/12/2024    9:57 AM  CBC  WBC 4.0 - 10.5 K/uL 9.0   Hemoglobin 12.0 - 15.0 g/dL 86.9    Hematocrit 63.9 - 46.0 % 40.9   Platelets 150 - 400 K/uL 298      Assessment and plan- Patient is a 53 y.o. female here for routine follow-up of chronic phase CML presently on nilotinib   Chronic myeloid leukemia, BCR/ABL-positive, on nilotinib  therapy CML, BCR/ABL-positive, effectively controlled with nilotinib . Current BCR-ABL PCR level is 0.11%. No significant side effects from nilotinib . Discussed drug interactions and importance of informing providers about new medications. - Continue nilotinib  therapy as long as BCR-ABL PCR levels remain less than 1%. - Monitor BCR-ABL PCR levels every three months. - Check CBC with differential, liver function tests (CMP), and cholesterol levels in three months. - Ensure communication with healthcare providers regarding potential drug interactions with nilotinib .  Hyperlipidemia on pravastatin therapy Hyperlipidemia managed with pravastatin. Previous LDL level was 194 mg/dL. - Recheck cholesterol levels in three months. - If cholesterol levels remain abnormal, consider consulting with primary care provider.   Visit Diagnosis 1. High risk medication use   2. CML (chronic myelocytic leukemia) (HCC)      Dr. Annah Skene, MD, MPH [facility] [phone] 09/26/2024 10:24 AM

## 2024-09-26 NOTE — Progress Notes (Signed)
 Patient has no new or acute concerns at this time.

## 2024-09-30 ENCOUNTER — Telehealth: Payer: Self-pay

## 2024-09-30 NOTE — Telephone Encounter (Signed)
 Pt has requested to cancel her colonoscopy per Trish in Endo due to transportation.  Pt was scheduled with Dr. Marinda for routine screening colonoscopy 10/01/24.  Thanks,  Rosaline CMA

## 2024-10-01 ENCOUNTER — Ambulatory Visit: Admission: RE | Admit: 2024-10-01 | Source: Home / Self Care | Admitting: General Surgery

## 2024-10-01 SURGERY — COLONOSCOPY
Anesthesia: General

## 2024-10-08 ENCOUNTER — Other Ambulatory Visit (HOSPITAL_COMMUNITY): Payer: Self-pay

## 2024-10-08 ENCOUNTER — Other Ambulatory Visit: Payer: Self-pay

## 2024-10-08 NOTE — Progress Notes (Signed)
 Specialty Pharmacy Refill Coordination Note  Carol Ortega is a 53 y.o. female contacted today regarding refills of specialty medication(s) Nilotinib  HCl (TASIGNA )   Patient requested Delivery   Delivery date: 10/14/24   Verified address: [address]  **[phone]   Medication will be filled on 10/13/24.

## 2024-10-13 ENCOUNTER — Other Ambulatory Visit: Payer: Self-pay

## 2024-10-28 ENCOUNTER — Other Ambulatory Visit: Payer: Self-pay

## 2024-10-28 NOTE — Progress Notes (Signed)
 Specialty Pharmacy Ongoing Clinical Assessment Note  Carol Ortega is a 53 y.o. female who is being followed by the specialty pharmacy service for RxSp Oncology   Patient's specialty medication(s) reviewed today: Nilotinib  HCl (TASIGNA )   Missed doses in the last 4 weeks: 0   Patient/Caregiver did not have any additional questions or concerns.   Therapeutic benefit summary: Patient is achieving benefit   Adverse events/side effects summary: No adverse events/side effects   Patient's therapy is appropriate to: Continue    Goals Addressed             This Visit's Progress    Achieve or maintain remission   On track    Patient is on track. Patient will maintain adherence. Per office visit on 09/26/24, patient continues to respond to therapy and her B2A2 transcript is now down to 0.11%.           Follow up: 3 months  [facility]

## 2024-11-03 ENCOUNTER — Other Ambulatory Visit: Payer: Self-pay | Admitting: Oncology

## 2024-11-03 ENCOUNTER — Other Ambulatory Visit: Payer: Self-pay

## 2024-11-03 DIAGNOSIS — C9210 Chronic myeloid leukemia, BCR/ABL-positive, not having achieved remission: Secondary | ICD-10-CM

## 2024-11-03 MED ORDER — NILOTINIB HCL 200 MG PO CAPS
400.0000 mg | ORAL_CAPSULE | Freq: Two times a day (BID) | ORAL | 1 refills | Status: DC
  Filled 2024-11-03 – 2024-11-05 (×2): qty 120, 30d supply, fill #0
  Filled 2024-11-25: qty 112, 28d supply, fill #0
  Filled 2024-12-19: qty 112, 28d supply, fill #1

## 2024-11-05 ENCOUNTER — Other Ambulatory Visit: Payer: Self-pay

## 2024-11-05 ENCOUNTER — Other Ambulatory Visit (HOSPITAL_COMMUNITY): Payer: Self-pay

## 2024-11-07 ENCOUNTER — Other Ambulatory Visit: Payer: Self-pay

## 2024-11-12 DIAGNOSIS — F4312 Post-traumatic stress disorder, chronic: Secondary | ICD-10-CM | POA: Diagnosis not present

## 2024-11-18 ENCOUNTER — Telehealth: Payer: Self-pay

## 2024-11-18 NOTE — Telephone Encounter (Addendum)
 Patient calling to see if OK to take Tramadol  that she has at home from an old ankle injury with her Tasigna .    Having left leg pian from groin area down for the past 2 days, with no tenderness or discoloration.  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  Did trip over something but doesn't think she had a hard enough fall to injure her leg.  Took Ibuprofen  and muscle relaxer yesterday but pain was still there when she woke up from nap.

## 2024-11-18 NOTE — Telephone Encounter (Signed)
 The [employer] can increase serum concentrations of tramadol  but okay to take it for a short period of time less than 1 week.  If pain persists or gets worse she should go to PCP or she has an [facility]

## 2024-11-18 NOTE — Telephone Encounter (Signed)
 Patient informed and agrees with MD recommendation.

## 2024-11-20 ENCOUNTER — Emergency Department: Admission: EM | Admit: 2024-11-20 | Discharge: 2024-11-20 | Disposition: A | Discharge status: Home / Self Care

## 2024-11-20 ENCOUNTER — Other Ambulatory Visit: Payer: Self-pay

## 2024-11-20 ENCOUNTER — Emergency Department

## 2024-11-20 DIAGNOSIS — M5432 Sciatica, left side: Secondary | ICD-10-CM | POA: Diagnosis not present

## 2024-11-20 DIAGNOSIS — M5442 Lumbago with sciatica, left side: Secondary | ICD-10-CM | POA: Diagnosis not present

## 2024-11-20 DIAGNOSIS — Z856 Personal history of leukemia: Secondary | ICD-10-CM | POA: Diagnosis not present

## 2024-11-20 DIAGNOSIS — M79605 Pain in left leg: Secondary | ICD-10-CM | POA: Diagnosis present

## 2024-11-20 MED ORDER — OXYCODONE-ACETAMINOPHEN 5-325 MG PO TABS
1.0000 | ORAL_TABLET | Freq: Once | ORAL | Status: AC
  Administered 2024-11-20: 1 via ORAL
  Filled 2024-11-20: qty 1

## 2024-11-20 MED ORDER — METHOCARBAMOL 500 MG PO TABS: 500.0000 mg | ORAL_TABLET | Freq: Four times a day (QID) | ORAL | 0 refills | Status: AC | PRN

## 2024-11-20 MED ORDER — KETOROLAC TROMETHAMINE 15 MG/ML IJ SOLN
15.0000 mg | Freq: Once | INTRAMUSCULAR | Status: AC
  Administered 2024-11-20: 15 mg via INTRAMUSCULAR
  Filled 2024-11-20: qty 1

## 2024-11-20 MED ORDER — DIAZEPAM 5 MG PO TABS
5.0000 mg | ORAL_TABLET | Freq: Once | ORAL | Status: AC
  Administered 2024-11-20: 5 mg via ORAL
  Filled 2024-11-20: qty 1

## 2024-11-20 NOTE — ED Triage Notes (Signed)
 Pt reports left leg pain from her thigh to her lower leg for the past few days. Pt reports hx leukemia. Pt reports she fell this past Thursday but had no injury from fall.

## 2024-11-20 NOTE — ED Provider Notes (Signed)
 [facility] Provider Note    Event Date/Time   First MD Initiated Contact with Patient 11/20/24 [phone]     (approximate)   History   Leg Pain   HPI  Marline Morace is a 53 y.o. female who presents with left leg pain.  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  She presents today, she had a fall on Thanksgiving, denies any trauma to the head was able to get back up and was doing well, about 2 days ago developed some left leg pain, pain primarily along the lateral hip radiating along the lateral and anterior aspect of the upper leg, below the knee states that she has some pins and needle sensation primarily anteriorly across the lower leg.  She states that she has a history of sciatica, this does not feel like it to her.  She denies any associated back pain, loss of control of her bowel or bladder, or any other symptoms.  She has tried taking her home muscle relaxers as well as tramadol  and ibuprofen  at home without much relief of symptoms.  She states that she is having trouble getting around because of the severity of the pain.      Physical Exam   Triage Vital Signs: ED Triage Vitals  Encounter Vitals Group     BP 11/20/24 0341 (!) 147/84     Girls Systolic BP Percentile --      Girls Diastolic BP Percentile --      Boys Systolic BP Percentile --      Boys Diastolic BP Percentile --      Pulse Rate 11/20/24 0341 89     Resp 11/20/24 0340 18     Temp 11/20/24 0340 98.7 F (37.1 C)     Temp src --      SpO2 11/20/24 0341 99 %     Weight 11/20/24 0337 280 lb (127 kg)     Height 11/20/24 0337 5' 7 (1.702 m)     Head Circumference --      Peak Flow --      Pain Score 11/20/24 0336 10     Pain Loc --      Pain Education --      Exclude from Growth Chart --     Most recent vital  signs: Vitals:   11/20/24 0340 11/20/24 0341  BP:  (!) 147/84  Pulse:  89  Resp: 18   Temp: 98.7 F (37.1 C)   SpO2:  99%     General: Awake, tearful in exam room appears very uncomfortable  CV:  Good peripheral perfusion.  Appropriate pulses appreciated in the lower extremities Resp:  Normal effort.  Speaking full sentences Abd:  No distention.  Soft nontender MSK:  Pain elicited with both passive and active range of motion of the left lower extremity, I do not appreciate any cervical thoracic or lumbar spinal tenderness on my exam.  There is discomfort to palpation along the lateral aspect of the upper leg as well as the anterior upper leg, she seems to have appropriate sensation to touch along both lower extremities Other:     ED Results / Procedures / Treatments   Labs (all labs ordered are listed, but only abnormal results are displayed) Labs Reviewed - No data to display   EKG  RADIOLOGY   PROCEDURES:  Critical Care performed: No  Procedures   MEDICATIONS ORDERED IN ED: Medications  ketorolac  (TORADOL ) 15 MG/ML injection 15 mg (15 mg Intramuscular Given 11/20/24 0405)  diazepam  (VALIUM ) tablet 5 mg (5 mg Oral Given 11/20/24 0405)  oxyCODONE -acetaminophen  (PERCOCET/ROXICET) 5-325 MG per tablet 1 tablet (1 tablet Oral Given 11/20/24 0502)     IMPRESSION / MDM / ASSESSMENT AND PLAN / ED COURSE  I reviewed the triage vital signs and the nursing notes.                               Patient's presentation is most consistent with acute complicated illness / injury requiring diagnostic workup.  53 year old female who presents today with concern of leg pain.  She has a history of leukemia and a prior DVT, and is currently not anticoagulated.  Given the findings, believe reasonable to obtain ultrasound imaging to further assess for underlying DVT, however clinically I feel this is unlikely and her history and exam appears most consistent with sciatic pain.  She  does not have signs or symptoms of cord compression, we will attempt symptomatic management and determine safe disposition accordingly.   Clinical Course as of 11/20/24 0604  Thu Nov 20, 2024  0455 Patient appears much more comfortable, states that she still has some discomfort, she appears to have been able to raise her legs up now which seem to be very difficult for her, offered her a dose of oxycodone , she is requesting something IV, I explained to her that that would not be something I am comfortable with at this time.  Awaiting ultrasound results but I did review it myself, I do not see any evidence of blood clots.  Will give a dose of oxycodone  reassess patient's symptomatology I dissipate likely reasonable for discharge home. [SK]  0500 Her ultrasound result is reassuring, believe reasonable for discharge home at this time, we will encourage follow-up in the outpatient setting and discussed return precautions. [SK]    Clinical Course User Index [SK] Fernand Rossie HERO, MD     FINAL CLINICAL IMPRESSION(S) / ED DIAGNOSES   Final diagnoses:  Sciatica of left side     Rx / DC Orders   ED Discharge Orders          Ordered    methocarbamol  (ROBAXIN ) 500 MG tablet  Every 6 hours PRN        11/20/24 0510             Note:  This document was prepared using Dragon voice recognition software and may include unintentional dictation errors.   Fernand Rossie HERO, MD 11/20/24 [phone]

## 2024-11-20 NOTE — Discharge Instructions (Signed)
 You were seen today due to concern of leg pain.  At this time I have written for some medication for you to take, please take these as instructed.  I suspect her symptoms are likely due to compression of your sciatic nerve.  Please follow-up with your primary doctor and discuss possible physical therapy as this will likely help with management of your symptoms.  If you have any worsening of symptoms such as loss of control of your bowel, bladder, frequent falls, or any other symptoms you find concerning please return to the emergency department immediately for further medical management.

## 2024-11-25 ENCOUNTER — Other Ambulatory Visit: Payer: Self-pay | Admitting: Pharmacy Technician

## 2024-11-25 ENCOUNTER — Other Ambulatory Visit: Payer: Self-pay

## 2024-11-25 NOTE — Progress Notes (Signed)
 Specialty Pharmacy Refill Coordination Note  Carol Ortega is a 53 y.o. female contacted today regarding refills of specialty medication(s) Nilotinib  HCl (TASIGNA )   Patient requested Delivery   Delivery date: 11/27/24   Verified address: [address] [place]   Medication will be filled on: 11/26/24

## 2024-11-26 ENCOUNTER — Other Ambulatory Visit (HOSPITAL_COMMUNITY): Payer: Self-pay

## 2024-11-26 ENCOUNTER — Other Ambulatory Visit: Payer: Self-pay

## 2024-11-27 DIAGNOSIS — L249 Irritant contact dermatitis, unspecified cause: Secondary | ICD-10-CM | POA: Diagnosis not present

## 2024-11-27 DIAGNOSIS — H44002 Unspecified purulent endophthalmitis, left eye: Secondary | ICD-10-CM | POA: Diagnosis not present

## 2024-11-27 DIAGNOSIS — R7303 Prediabetes: Secondary | ICD-10-CM | POA: Diagnosis not present

## 2024-11-27 DIAGNOSIS — E538 Deficiency of other specified B group vitamins: Secondary | ICD-10-CM | POA: Diagnosis not present

## 2024-11-27 DIAGNOSIS — R253 Fasciculation: Secondary | ICD-10-CM | POA: Diagnosis not present

## 2024-11-27 DIAGNOSIS — H60502 Unspecified acute noninfective otitis externa, left ear: Secondary | ICD-10-CM | POA: Diagnosis not present

## 2024-11-27 DIAGNOSIS — F411 Generalized anxiety disorder: Secondary | ICD-10-CM | POA: Diagnosis not present

## 2024-12-02 ENCOUNTER — Telehealth

## 2024-12-02 DIAGNOSIS — R197 Diarrhea, unspecified: Secondary | ICD-10-CM | POA: Diagnosis not present

## 2024-12-02 MED ORDER — LOPERAMIDE HCL 2 MG PO TABS: 2.0000 mg | ORAL_TABLET | Freq: Four times a day (QID) | ORAL | 0 refills | Status: AC | PRN

## 2024-12-02 NOTE — Progress Notes (Signed)
 Virtual Visit Consent   Carol Ortega, you are scheduled for a virtual visit with a [facility] provider today. Just as with appointments in the office, your consent must be obtained to participate. Your consent will be active for this visit and any virtual visit you may have with one of our providers in the next 365 days. If you have a MyChart account, a copy of this consent can be sent to you electronically.  As this is a virtual visit, video technology does not allow for your provider to perform a traditional examination. This may limit your provider's ability to fully assess your condition. If your provider identifies any concerns that need to be evaluated in person or the need to arrange testing (such as labs, EKG, etc.), we will make arrangements to do so. Although advances in technology are sophisticated, we cannot ensure that it will always work on either your end or our end. If the connection with a video visit is poor, the visit may have to be switched to a telephone visit. With either a video or telephone visit, we are not always able to ensure that we have a secure connection.  By engaging in this virtual visit, you consent to the provision of healthcare and authorize for your insurance to be billed (if applicable) for the services provided during this visit. Depending on your insurance coverage, you may receive a charge related to this service.  I need to obtain your verbal consent now. Are you willing to proceed with your visit today? Carol Ortega has provided verbal consent on 12/02/2024 for a virtual visit (video or telephone). Carol CHRISTELLA Barefoot, NP  Date: 12/02/2024 9:01 AM   Virtual Visit via Video Note   I, Carol Ortega, connected with  Carol Ortega  ([id number], [birth year]) on 12/02/2024 at  9:00 AM EST by a video-enabled telemedicine application and verified that I am speaking with the correct person using two identifiers.  Location: Patient: Virtual Visit Location  Patient: Home Provider: Virtual Visit Location Provider: Home Office   I discussed the limitations of evaluation and management by telemedicine and the availability of in person appointments. The patient expressed understanding and agreed to proceed.    History of Present Illness: Carol Ortega is a 53 y.o. who identifies as a female who was assigned female at birth, and is being seen today for diarrhea  Onset was yesterday morning- has had several episodes of diarrhea- has been having it off and on and through the night. This morning she is having small bouts- but nothing large. Was able to keep 4 four crackers down this morning sips of fluids Associated symptoms are a lot of gas and gurgling the days prior. Unsure if related to food. She did eat some soup and some chicken night prior to onset. Did just stop wevogy a few months ago. Temp was 99 last night.  Modifying factors are is on leukemia medication  Denies chest pain, shortness of breath, fevers, chills, nausea.   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   Menorrhagia with irregular cycle 10/30/2019   Personal history of benign brain tumor 11/06/2018   Insomnia 07/30/2018   Chronic pain of both knees 07/30/2018   Morbid obesity due to excess calories (HCC) 05/09/2018   GAD (generalized  anxiety disorder) 05/09/2018   DVT (deep venous thrombosis) (HCC) 04/15/2018   Bladder spasms 03/12/2018   Ureteral stone with hydronephrosis 02/28/2018   Kidney stone 04/09/2017   H/O pulmonary embolus during pregnancy 08/28/2016   S/P laparoscopic cholecystectomy 08/28/2016   Hyperparathyroidism (HCC) 09/10/2015   Vitamin D  deficiency 06/25/2014   Hypercalcemia 02/09/2014   Essential hypertension 01/30/2014    Hyperlipidemia 01/30/2014   Nephrolithiasis, 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  Resting comfortably  at home.  Head is normocephalic, atraumatic.  No labored breathing.  Speech is clear and coherent with logical content.  Patient is alert and oriented at baseline.    Assessment and Plan:  1. Diarrhea, unspecified type (Primary)  - loperamide  (IMODIUM  A-D) 2 MG tablet; Take 1 tablet (2 mg total) by mouth 4 (four) times daily as needed for diarrhea or loose stools.  Dispense: 30 tablet; Refill: 0   Most cases of acute diarrhea are due to infections with virus and bacteria and are self-limited conditions lasting less than 14 days.  For your symptoms you may take Imodium  2 mg tablets that are over the counter at your local pharmacy. Take two tablet now and then one after each loose stool up to 6 a day.    HOME CARE We recommend changing your diet to help with your symptoms for the next few days. Drink plenty of fluids that contain water salt and sugar. Sports drinks such as Gatorade may help.  You may try broths, soups, bananas, applesauce, soft breads, mashed potatoes or crackers.  You are considered infectious for as long as the diarrhea continues. Hand washing or use of alcohol based hand sanitizers is recommend. It is best to stay out of work or school until your symptoms stop.   GET HELP RIGHT AWAY If you have dark yellow colored urine or do not pass urine  frequently you should drink more fluids.   If your symptoms worsen  If you feel like you are going to pass out (faint) You have a new problem  MAKE SURE YOU  Understand these instructions. Will watch your condition. Will get help right away if you are not doing well or get worse.     Reviewed side effects, risks and benefits of medication.    Patient acknowledged agreement and understanding of the plan.   Past Medical, Surgical, Social History, Allergies, and Medications have been Reviewed.    Follow Up Instructions: I discussed the assessment and treatment plan with the patient. The patient was provided an opportunity to ask questions and all were answered. The patient agreed with the plan and demonstrated an understanding of the instructions.  A copy of instructions were sent to the patient via MyChart unless otherwise noted below.    The patient was advised to call back or seek an in-person evaluation if the symptoms worsen or if the condition fails to improve as anticipated.    Carol CHRISTELLA Barefoot, NP     [1]  Allergies Allergen Reactions   Bee Venom Anaphylaxis  Egg Protein-Containing Drug Products Anaphylaxis   Other Anaphylaxis    Surgical dye.. Pt states ok to take if previously taken benadryl  or prednisone    Penicillins Hives and Other (See Comments)    Has patient had a PCN reaction causing immediate rash, facial/tongue/throat swelling, SOB or lightheadedness with hypotension: No Has patient had a PCN reaction causing severe rash involving mucus membranes or skin necrosis: No Has patient had a PCN reaction that required hospitalization No Has patient had a PCN reaction occurring within the last 10 years: No If all of the above answers are NO, then may proceed with Cephalosporin use.   Latex Rash   Oxycodone -Acetaminophen  Itching   Wellbutrin  [Bupropion ] Other (See Comments)    Irritiability  [2]  Current Outpatient Medications:    acetaminophen  (TYLENOL ) 325  MG tablet, Take 650 mg by mouth every 6 (six) hours as needed for mild pain., Disp: , Rfl:    albuterol  (PROVENTIL ) (2.5 MG/3ML) 0.083% nebulizer solution, Take 3 mLs (2.5 mg total) by nebulization every 6 (six) hours as needed for wheezing or shortness of breath., Disp: 75 mL, Rfl: 12   alclomethasone (ACLOVATE) 0.05 % cream, Apply topically., Disp: , Rfl:    dicyclomine  (BENTYL ) 10 MG capsule, Take 1 capsule (10 mg total) by mouth 4 (four) times daily -  before meals and at bedtime., Disp: 90 capsule, Rfl: 1   DULoxetine (CYMBALTA) 60 MG capsule, Take 1 capsule by mouth daily. (Patient not taking: Reported on 06/24/2024), Disp: , Rfl:    EPINEPHrine 0.3 mg/0.3 mL IJ SOAJ injection, Inject into the muscle., Disp: , Rfl:    fluconazole  (DIFLUCAN ) 150 MG tablet, Take by mouth. (Patient not taking: Reported on 06/24/2024), Disp: , Rfl:    hydrOXYzine  (ATARAX ) 10 MG tablet, Take 10 mg by mouth at bedtime., Disp: , Rfl:    methocarbamol  (ROBAXIN ) 500 MG tablet, Take 1 tablet (500 mg total) by mouth 4 (four) times daily., Disp: 28 tablet, Rfl: 0   methocarbamol  (ROBAXIN ) 500 MG tablet, Take 1 tablet (500 mg total) by mouth every 6 (six) hours as needed for muscle spasms., Disp: 15 tablet, Rfl: 0   nilotinib  (TASIGNA ) 200 MG capsule, Take 2 capsules (400 mg total) by mouth every 12 (twelve) hours. Take on an empty stomach, at least one hour before or two hours after food., Disp: 120 capsule, Rfl: 1   nystatin  ointment (MYCOSTATIN ), Apply 1 Application topically 2 (two) times daily. Mix together with equal amount of triamcinolone  ointment and apply to affected area., Disp: 30 g, Rfl: 0   ondansetron  (ZOFRAN -ODT) 4 MG disintegrating tablet, Dis 1 T on the tongue every 6 hours prn, Disp: , Rfl:    pravastatin (PRAVACHOL) 10 MG tablet, Take 10 mg by mouth daily., Disp: , Rfl:    Semaglutide-Weight Management (WEGOVY) 0.25 MG/0.5ML SOAJ, Inject into the skin., Disp: , Rfl:    triamcinolone  ointment (KENALOG ) 0.5 %,  Apply 1 Application topically 2 (two) times daily. Mix with equal parts nystatin  ointment before applying., Disp: 30 g, Rfl: 0   VENTOLIN  HFA 108 (90 Base) MCG/ACT inhaler, Inhale 2 puffs into the lungs every 6 (six) hours as needed., Disp: , Rfl:    Vitamin D , Ergocalciferol , (DRISDOL ) 1.25 MG (50000 UNIT) CAPS capsule, Take 1 capsule by mouth once a week., Disp: , Rfl:

## 2024-12-02 NOTE — Patient Instructions (Signed)
 Kieth Hacker, thank you for joining Carol CHRISTELLA Barefoot, NP for today's virtual visit.  While this provider is not your primary care provider (PCP), if your PCP is located in our provider database this encounter information will be shared with them immediately following your visit.   A [facility] MyChart account gives you access to today's visit and all your visits, tests, and labs performed at [facility]  click here if you don't have a [facility] MyChart account or go to [email or web address]  Consent: (Patient) Carol Ortega provided verbal consent for this virtual visit at the beginning of the encounter.  Current Medications:  Current Outpatient Medications:    loperamide  (IMODIUM  A-D) 2 MG tablet, Take 1 tablet (2 mg total) by mouth 4 (four) times daily as needed for diarrhea or loose stools., Disp: 30 tablet, Rfl: 0   acetaminophen  (TYLENOL ) 325 MG tablet, Take 650 mg by mouth every 6 (six) hours as needed for mild pain., Disp: , Rfl:    albuterol  (PROVENTIL ) (2.5 MG/3ML) 0.083% nebulizer solution, Take 3 mLs (2.5 mg total) by nebulization every 6 (six) hours as needed for wheezing or shortness of breath., Disp: 75 mL, Rfl: 12   alclomethasone (ACLOVATE) 0.05 % cream, Apply topically., Disp: , Rfl:    dicyclomine  (BENTYL ) 10 MG capsule, Take 1 capsule (10 mg total) by mouth 4 (four) times daily -  before meals and at bedtime., Disp: 90 capsule, Rfl: 1   DULoxetine (CYMBALTA) 60 MG capsule, Take 1 capsule by mouth daily. (Patient not taking: Reported on 06/24/2024), Disp: , Rfl:    EPINEPHrine 0.3 mg/0.3 mL IJ SOAJ injection, Inject into the muscle., Disp: , Rfl:    fluconazole  (DIFLUCAN ) 150 MG tablet, Take by mouth. (Patient not taking: Reported on 06/24/2024), Disp: , Rfl:    hydrOXYzine  (ATARAX ) 10 MG tablet, Take 10 mg by mouth at bedtime., Disp: , Rfl:    methocarbamol  (ROBAXIN ) 500 MG tablet, Take 1 tablet (500 mg total) by mouth 4 (four) times daily., Disp: 28  tablet, Rfl: 0   methocarbamol  (ROBAXIN ) 500 MG tablet, Take 1 tablet (500 mg total) by mouth every 6 (six) hours as needed for muscle spasms., Disp: 15 tablet, Rfl: 0   nilotinib  (TASIGNA ) 200 MG capsule, Take 2 capsules (400 mg total) by mouth every 12 (twelve) hours. Take on an empty stomach, at least one hour before or two hours after food., Disp: 120 capsule, Rfl: 1   nystatin  ointment (MYCOSTATIN ), Apply 1 Application topically 2 (two) times daily. Mix together with equal amount of triamcinolone  ointment and apply to affected area., Disp: 30 g, Rfl: 0   ondansetron  (ZOFRAN -ODT) 4 MG disintegrating tablet, Dis 1 T on the tongue every 6 hours prn, Disp: , Rfl:    pravastatin (PRAVACHOL) 10 MG tablet, Take 10 mg by mouth daily., Disp: , Rfl:    Semaglutide-Weight Management (WEGOVY) 0.25 MG/0.5ML SOAJ, Inject into the skin., Disp: , Rfl:    triamcinolone  ointment (KENALOG ) 0.5 %, Apply 1 Application topically 2 (two) times daily. Mix with equal parts nystatin  ointment before applying., Disp: 30 g, Rfl: 0   VENTOLIN  HFA 108 (90 Base) MCG/ACT inhaler, Inhale 2 puffs into the lungs every 6 (six) hours as needed., Disp: , Rfl:    Vitamin D , Ergocalciferol , (DRISDOL ) 1.25 MG (50000 UNIT) CAPS capsule, Take 1 capsule by mouth once a week., Disp: , Rfl:    Medications ordered in this encounter:  Meds ordered this encounter  Medications   loperamide  (IMODIUM  A-D)  2 MG tablet    Sig: Take 1 tablet (2 mg total) by mouth 4 (four) times daily as needed for diarrhea or loose stools.    Dispense:  30 tablet    Refill:  0    Supervising Provider:   LAMPTEY, PHILIP O [[id number]]     *If you need refills on other medications prior to your next appointment, please contact your pharmacy*  Follow-Up: Call back or seek an in-person evaluation if the symptoms worsen or if the condition fails to improve as anticipated.  [facility] [phone]  Other Instructions  Most cases of acute diarrhea  are due to infections with virus and bacteria and are self-limited conditions lasting less than 14 days.  For your symptoms you may take Imodium  2 mg tablets that are over the counter at your local pharmacy. Take two tablet now and then one after each loose stool up to 6 a day.    HOME CARE We recommend changing your diet to help with your symptoms for the next few days. Drink plenty of fluids that contain water salt and sugar. Sports drinks such as Gatorade may help.  You may try broths, soups, bananas, applesauce, soft breads, mashed potatoes or crackers.  You are considered infectious for as long as the diarrhea continues. Hand washing or use of alcohol based hand sanitizers is recommend. It is best to stay out of work or school until your symptoms stop.   GET HELP RIGHT AWAY If you have dark yellow colored urine or do not pass urine frequently you should drink more fluids.   If your symptoms worsen  If you feel like you are going to pass out (faint) You have a new problem  MAKE SURE YOU  Understand these instructions. Will watch your condition. Will get help right away if you are not doing well or get worse.     If you have been instructed to have an in-person evaluation today at a local Urgent Care facility, please use the link below. It will take you to a list of all of our available [facility], including address, phone number and hours of operation. Please do not delay care.  [facility]  If you or a family member do not have a primary care provider, use the link below to schedule a visit and establish care. When you choose a [facility] primary care physician or advanced practice provider, you gain a long-term partner in health. Find a Primary Care Provider  Learn more about [facility]'s in-office and virtual care options: [facility] - Get Care Now

## 2024-12-16 ENCOUNTER — Other Ambulatory Visit (HOSPITAL_COMMUNITY): Payer: Self-pay

## 2024-12-19 ENCOUNTER — Other Ambulatory Visit: Payer: Self-pay

## 2024-12-19 ENCOUNTER — Other Ambulatory Visit (HOSPITAL_COMMUNITY): Payer: Self-pay

## 2024-12-23 ENCOUNTER — Other Ambulatory Visit (HOSPITAL_COMMUNITY): Payer: Self-pay

## 2024-12-23 NOTE — Progress Notes (Signed)
 Specialty Pharmacy Refill Coordination Note  Evanthia Maund is a 54 y.o. female contacted today regarding refills of specialty medication(s) Nilotinib  HCl (TASIGNA )   Patient requested Delivery   Delivery date: 12/30/24   Verified address: [address] [place]   Medication will be filled on: 12/29/24

## 2024-12-26 ENCOUNTER — Inpatient Hospital Stay: Attending: Oncology

## 2024-12-26 DIAGNOSIS — C9210 Chronic myeloid leukemia, BCR/ABL-positive, not having achieved remission: Secondary | ICD-10-CM | POA: Insufficient documentation

## 2024-12-26 DIAGNOSIS — Z79899 Other long term (current) drug therapy: Secondary | ICD-10-CM | POA: Diagnosis not present

## 2024-12-26 LAB — CMP (CANCER CENTER ONLY)
ALT: 8 U/L (ref 0–44)
AST: 12 U/L — ABNORMAL LOW (ref 15–41)
Albumin: 3.6 g/dL (ref 3.5–5.0)
Alkaline Phosphatase: 87 U/L (ref 38–126)
Anion gap: 8 (ref 5–15)
BUN: 14 mg/dL (ref 6–20)
CO2: 27 mmol/L (ref 22–32)
Calcium: 9.1 mg/dL (ref 8.9–10.3)
Chloride: 105 mmol/L (ref 98–111)
Creatinine: 0.86 mg/dL (ref 0.44–1.00)
GFR, Estimated: >60 mL/min
Glucose, Bld: 86 mg/dL (ref 70–99)
Potassium: 3.8 mmol/L (ref 3.5–5.1)
Sodium: 140 mmol/L (ref 135–145)
Total Bilirubin: 0.5 mg/dL (ref 0.0–1.2)
Total Protein: 7.1 g/dL (ref 6.5–8.1)

## 2024-12-26 LAB — CBC WITH DIFFERENTIAL (CANCER CENTER ONLY)
Abs Immature Granulocytes: 0.03 K/uL (ref 0.00–0.07)
Basophils Absolute: 0.1 K/uL (ref 0.0–0.1)
Basophils Relative: 1 %
Eosinophils Absolute: 0.3 K/uL (ref 0.0–0.5)
Eosinophils Relative: 4 %
HCT: 40.5 % (ref 36.0–46.0)
Hemoglobin: 12.6 g/dL (ref 12.0–15.0)
Immature Granulocytes: 0 %
Lymphocytes Relative: 33 %
Lymphs Abs: 2.5 K/uL (ref 0.7–4.0)
MCH: 27.8 pg (ref 26.0–34.0)
MCHC: 31.1 g/dL (ref 30.0–36.0)
MCV: 89.2 fL (ref 80.0–100.0)
Monocytes Absolute: 0.9 K/uL (ref 0.1–1.0)
Monocytes Relative: 12 %
Neutro Abs: 3.6 K/uL (ref 1.7–7.7)
Neutrophils Relative %: 50 %
Platelet Count: 297 K/uL (ref 150–400)
RBC: 4.54 MIL/uL (ref 3.87–5.11)
RDW: 14.1 % (ref 11.5–15.5)
WBC Count: 7.4 K/uL (ref 4.0–10.5)
nRBC: 0 % (ref 0.0–0.2)

## 2024-12-26 LAB — LIPID PANEL
Cholesterol: 258 mg/dL — ABNORMAL HIGH (ref 0–200)
HDL: 50 mg/dL
LDL Cholesterol: 180 mg/dL — ABNORMAL HIGH (ref 0–99)
Total CHOL/HDL Ratio: 5.2 ratio
Triglycerides: 138 mg/dL
VLDL: 28 mg/dL (ref 0–40)

## 2024-12-29 ENCOUNTER — Other Ambulatory Visit: Payer: Self-pay

## 2024-12-29 LAB — BCR-ABL1 FISH
Cells Analyzed: 200
Cells Counted: 200

## 2025-01-02 LAB — BCR-ABL1, CML/ALL, PCR, QUANT
E1A2 Transcript: <0.0032 %
b2a2 transcript: 0.1221 %
b3a2 transcript: <0.0032 %

## 2025-01-08 ENCOUNTER — Other Ambulatory Visit: Payer: Self-pay

## 2025-01-08 DIAGNOSIS — E785 Hyperlipidemia, unspecified: Secondary | ICD-10-CM

## 2025-01-09 ENCOUNTER — Inpatient Hospital Stay: Admitting: Oncology

## 2025-01-09 ENCOUNTER — Telehealth: Payer: Self-pay | Admitting: Oncology

## 2025-01-09 NOTE — Telephone Encounter (Signed)
 Pt left vm to r/s appts due to being sick. I called pt back and r/s MD appt to next available. Date/time confirmed with pt

## 2025-01-13 ENCOUNTER — Encounter: Payer: Self-pay | Admitting: Emergency Medicine

## 2025-01-13 ENCOUNTER — Other Ambulatory Visit: Payer: Self-pay

## 2025-01-13 ENCOUNTER — Emergency Department

## 2025-01-13 ENCOUNTER — Telehealth: Payer: Self-pay | Admitting: *Deleted

## 2025-01-13 ENCOUNTER — Emergency Department
Admission: EM | Admit: 2025-01-13 | Discharge: 2025-01-13 | Disposition: A | Discharge status: Home / Self Care | Attending: Emergency Medicine | Admitting: Emergency Medicine

## 2025-01-13 DIAGNOSIS — I10 Essential (primary) hypertension: Secondary | ICD-10-CM | POA: Diagnosis not present

## 2025-01-13 DIAGNOSIS — Z856 Personal history of leukemia: Secondary | ICD-10-CM | POA: Insufficient documentation

## 2025-01-13 DIAGNOSIS — R0789 Other chest pain: Secondary | ICD-10-CM | POA: Insufficient documentation

## 2025-01-13 DIAGNOSIS — R079 Chest pain, unspecified: Secondary | ICD-10-CM

## 2025-01-13 DIAGNOSIS — M25512 Pain in left shoulder: Secondary | ICD-10-CM | POA: Diagnosis not present

## 2025-01-13 DIAGNOSIS — C9210 Chronic myeloid leukemia, BCR/ABL-positive, not having achieved remission: Secondary | ICD-10-CM

## 2025-01-13 LAB — CBC
HCT: 42.3 % (ref 36.0–46.0)
Hemoglobin: 13.4 g/dL (ref 12.0–15.0)
MCH: 28 pg (ref 26.0–34.0)
MCHC: 31.7 g/dL (ref 30.0–36.0)
MCV: 88.5 fL (ref 80.0–100.0)
Platelets: 296 10*3/uL (ref 150–400)
RBC: 4.78 MIL/uL (ref 3.87–5.11)
RDW: 13.8 % (ref 11.5–15.5)
WBC: 8.7 10*3/uL (ref 4.0–10.5)
nRBC: 0 % (ref 0.0–0.2)

## 2025-01-13 LAB — BASIC METABOLIC PANEL WITH GFR
Anion gap: 11 (ref 5–15)
BUN: 7 mg/dL (ref 6–20)
CO2: 28 mmol/L (ref 22–32)
Calcium: 9.1 mg/dL (ref 8.9–10.3)
Chloride: 104 mmol/L (ref 98–111)
Creatinine, Ser: 0.57 mg/dL (ref 0.44–1.00)
GFR, Estimated: >60 mL/min
Glucose, Bld: 132 mg/dL — ABNORMAL HIGH (ref 70–99)
Potassium: 3.7 mmol/L (ref 3.5–5.1)
Sodium: 143 mmol/L (ref 135–145)

## 2025-01-13 LAB — TROPONIN T, HIGH SENSITIVITY: Troponin T High Sensitivity: 13 ng/L (ref 0–19)

## 2025-01-13 MED ORDER — ALUM & MAG HYDROXIDE-SIMETH 200-200-20 MG/5ML PO SUSP
30.0000 mL | Freq: Once | ORAL | Status: AC
  Administered 2025-01-13: 30 mL via ORAL
  Filled 2025-01-13: qty 30

## 2025-01-13 MED ORDER — LIDOCAINE VISCOUS HCL 2 % MT SOLN
15.0000 mL | Freq: Once | OROMUCOSAL | Status: AC
  Administered 2025-01-13: 15 mL via ORAL
  Filled 2025-01-13: qty 15

## 2025-01-13 MED ORDER — ONDANSETRON HCL 4 MG/2ML IJ SOLN
4.0000 mg | Freq: Once | INTRAMUSCULAR | Status: AC
  Administered 2025-01-13: 4 mg via INTRAVENOUS
  Filled 2025-01-13: qty 2

## 2025-01-13 MED ORDER — HYDROCODONE-ACETAMINOPHEN 5-325 MG PO TABS
1.0000 | ORAL_TABLET | Freq: Once | ORAL | Status: AC
  Administered 2025-01-13: 1 via ORAL
  Filled 2025-01-13: qty 1

## 2025-01-13 MED ORDER — HYDROCODONE-ACETAMINOPHEN 5-325 MG PO TABS: 1.0000 | ORAL_TABLET | ORAL | 0 refills | Status: AC | PRN

## 2025-01-13 MED ORDER — MORPHINE SULFATE (PF) 4 MG/ML IV SOLN
4.0000 mg | Freq: Once | INTRAVENOUS | Status: AC
  Administered 2025-01-13: 4 mg via INTRAVENOUS
  Filled 2025-01-13: qty 1

## 2025-01-13 NOTE — Telephone Encounter (Signed)
 Per Dr. Melanee- we can refill her zofran  for nausea. it is a little odd that she is having nausea with Tasigna  that was started a year ago and was doing well until recently. If symptoms persist despite maalox and prn zofran  we will consider GI referral.  I returned pt's phone call with provider's recommendation. Ok to proceed with Maalox. She declines RF on zofran . Her nausea has resolved. Patient stated that she had her first dose a few mins ago and went to lay back down and experienced severe heart burn again. She stated that she may go to the ER to be further evaluated if she does not get further relief.

## 2025-01-13 NOTE — ED Triage Notes (Signed)
 Pt c/o mid chest pain x3 days that feels like heartburn. Pt has taken pepcid and tums without relief. Pt reports feeling immediate relief after getting the nitroglycerin with EMS. Pt reports pain worsens with walking. Pt also reports 2 nights ago, she sat up in bed due to the chest pain and her L shoulder became stiff and she hasn't been able to move her shoulder. Pt tender to palpation over posterior aspect of glenohumeral joint.

## 2025-01-13 NOTE — Telephone Encounter (Signed)
 "    NURSING SYMPTOM TRIAGE ASSESSMENT & DISPOSITION  Mode of interaction:  Telephone Date of symptom triage interaction:  01/13/25 Time of symptom triage interaction:  1000  Cancer diagnosis:  CML (chronic myelocytic leukemia) (HCC)  Oral chemotherapy:  Yes, prescription for Tasigna  with last dose taken 01/11/2025.  Symptom Triage Protocol:  Nausea and Vomiting and heartburn  History of the problem:  Nausea Severity: 0-10 scale with 0 as not severe and 10 as unimaginably severe 4 - 6 = Moderate  Precipitating factors: none  Onset: Last 48 hours  Duration: Last 48 hours  Relieving factors: Sitting up   Nonpharmacologic interventions: Tums, Ginger Snaps.  Effectiveness:  some relief with the Ginger Snaps.  Food and fluid intake over past 24 hours: Patient has had limited intake in the last 24 hours due to heartburn and nausea. Reports 1 episodes of heart burn/reflux. Reports that heartburn is worse at bedtime when lying down. No history of hypertension, but she has been under some stress.   No h/o heart disease. No h/o prolonged QT on EKG- RN reviewed last EKG in 2024 per chart.  Denies any chest pain in her arms, jaw or back. She stated that when she was on Sprycel  in the past that she had a horrible case of heart burn.  She has used Tums and ginger snaps and only experienced minimal relief with the heart burn.  I offered [facility] in person today, however patient does not have transportation today. She has community transportation and it takes a few days to coordinate.. She stated that she would try arranging a door dash to bring her Mylanta or Maaloxx from store. She would like to try this first to see if she an get relief. RN reviewed with patient that she would have to take the antiacids- either hours before or 2 hours after your Tasigna  dose.  Patient held her Monday's dose of Tasigna  due to symptoms.     Associated symptoms: Reflux, heartburn  Nausea Grade: 1 = Loss of  appetite without alteration in eating habits 2 = Oral intake decreased without significant weight loss, dehydration, or malnutrition 3 = Inadequate oral caloric or fluid intake; tube feeding, TPN, or hospitalization indicated     History of the problem:  Vomiting Character, color, force, quality of vomit:  1 episode of acute vomiting  Other household members experiencing similar symptoms:  none  Are there other GI symptoms:  none  Severity: 0-10 scale with 0 as not severe and 10 as unimaginably severe 1 - 3 = Mild  Precipitating factors:  See rn assessment above  Onset:  See rn assessment above  Duration:  1 episode of vomiting only  Relieving factors:  See rn assessment above  Nonpharmacologic interventions:  See assessment above  Food and Fluid intake over past 24 hours:  Patient reports   Associated symptoms:  heartburn  Vomiting Grade:   1 = Intervention not indicated  2 = Outpatient IV hydration; medical intervention indicated  3 = Tube feeding, TPN, or hospitalization indicated  4 = Life-threatening consequences  5 = Death  1      Triage Nurse Guidance Signs and Symptoms Action  Grossly bloody stool   Signs of severe dehydration  Severe lethargy or weakness  Heart palpitations  Decreased urine output  Sunken eyes  Orthostatic hypotension  Dizziness   Temperature higher than 100.4 F WITH suspect neutropenia   Symptoms of immune-mediated colitis, such as diarrhea accompanied by blood in the  stool, severe abdominal pain, or tenderness   Symptoms of bowel perforation, such as diarrhea associated with severe abdominal pain, fever chills, nausea, and vomiting  Seek emergency care.  Call an ambulance immediately.   Excessive thirst, dry mouth  Temperature higher than 100.4 F WITHOUT suspect neutropenia  Diarrhea for more than five (5) days  More than four to six stools above baseline per day for two days  Swollen or painful abdomen  More than 10 stools per day   Weight loss of more than five (5) pounds since diarrhea began  Continued diarrhea despite antidiarrheal treatment  Decreased turgor; pinched skin does not spring back  Grade 3 or 4 nausea and vomiting  Seek urgent care within 24 hours.   Less than four (4) stools per day  Chronic diarrhea  Other family members with diarrhea  Recent travel to a foreign country  New prescription  Follow homecare instructions.  Notify MD if no improvement.    Patient Instruction  Disclaimer:  Patient specific and [employer] instruction provided verbally and sent through MyChart for active users.    Review with healthcare provider the prescribed antiemetic therapy, dose schedule, and route. Correctly and regularly comply with prescribed medication. Take frequent small sips of fluids.  Eat small amounts of food frequently.  Supplement diet with ginger or foods containing ginger.  Take an antiemetic 20 minutes prior to meals.  Monitor for signs of dehydration. Use distraction therapies (example:  music, breathing techniques, etc.)  Contact a healthcare provider during clinic hours if symptoms persist or become worse. If the patient is compliant with the antiemetic medication, contact the healthcare provider to receive an alternative antiemetic prescription  Seek Emergency Care Immediately if Any of the Following Occurs: Evidence of dehydration  Unable to eat or drink for 24 hours  Treatment change not effective within six hours  Teach Back Method used:  Yes      Provider Consulted  Provider name and credentials: Dr. Melanee and [facility] teams  Provider instruction: May take zofran  and antiacids as directed If symptoms persist, we may need to consider GI referral.     Nurse Triage Priority:  Non-urgent (review and reinforce self-care strategies)  Barriers to Care:  See [facility] Flowsheet  Nurse Triage Disposition:  Home care, return call if no improvement within 24 hours  Protocol Source:  Ruthine HERO., &  Eldonna CANDIE Pee.). (2019). Telephone triage for oncology nurses (3rd ed.). Oncology Nursing Society.  "

## 2025-01-13 NOTE — Progress Notes (Signed)
 Specialty Pharmacy Ongoing Clinical Assessment Note  Taletha Twiford is a 54 y.o. female who is being followed by the specialty pharmacy service for [facility]Sp Oncology   Patient's specialty medication(s) reviewed today: Nilotinib  HCl (TASIGNA )   Missed doses in the last 4 weeks: 2 (has missed the past two days due to current stomach issues, she is being evaluated on Friday)   Patient/Caregiver did not have any additional questions or concerns.   Therapeutic benefit summary: Patient is achieving benefit   Adverse events/side effects summary: Experienced adverse events/side effects (Patient is having nausea, vomiting, and severe heartburn that is new--it is likely unrelated to the Tasigna  due to the suddeness of her symptoms.  She has an appointment to be evalutated on Friday, but she might try to get this moved up.)   Patient's therapy is appropriate to: Continue    Goals Addressed             This Visit's Progress    Achieve or maintain remission   On track    Patient is on track. Patient will maintain adherence. Per office visit on 09/26/24, patient continues to respond to therapy and her B2A2 transcript is now down to 0.11%.           Follow up: 3 months  Silvano LOISE Dolly Specialty Pharmacist

## 2025-01-13 NOTE — ED Triage Notes (Signed)
 First nurse note: pt to ED [facility] from home for chest pain x2 days. 1 spray nitroglycerin given PTA with relief. VSS with EMS Describes left arm feels stiff

## 2025-01-13 NOTE — ED Provider Notes (Signed)
 "  [facility] Provider Note    Event Date/Time   First MD Initiated Contact with Patient 01/13/25 1533     (approximate)  History   Chief Complaint: Shoulder Pain and Chest Pain  HPI  Carol Ortega is a 54 y.o. female with a past medical history of anemia, anxiety, CML, hypertension, presents to the emergency department for chest discomfort and shoulder discomfort.  According to the patient over the last 2 days she has been experiencing a discomfort in the center of her chest that she believes was reflux/indigestion.  Has tried some over-the-counter medications without much relief.  Patient states the other night she slept on her left shoulder and since that time she has been experiencing pain in the left shoulder, worse with movement.  Patient states she has had similar pains in the leg that seem to happen and then resolved after a few days.  Patient denies any other injuries to the left shoulder.  No weakness in the arm states pain in left shoulder with range of motion.  No fever.  Physical Exam   Triage Vital Signs: ED Triage Vitals  Encounter Vitals Group     BP 01/13/25 1514 120/80     Girls Systolic BP Percentile --      Girls Diastolic BP Percentile --      Boys Systolic BP Percentile --      Boys Diastolic BP Percentile --      Pulse Rate 01/13/25 1514 79     Resp 01/13/25 1514 20     Temp 01/13/25 1514 98 F (36.7 C)     Temp Source 01/13/25 1514 Oral     SpO2 01/13/25 1514 97 %     Weight 01/13/25 1517 280 lb (127 kg)     Height 01/13/25 1517 5' 6 (1.676 m)     Head Circumference --      Peak Flow --      Pain Score 01/13/25 1517 7     Pain Loc --      Pain Education --      Exclude from Growth Chart --     Most recent vital signs: Vitals:   01/13/25 1514  BP: 120/80  Pulse: 79  Resp: 20  Temp: 98 F (36.7 C)  SpO2: 97%    General: Awake, no distress.  CV:  Good peripheral perfusion.  Regular rate and rhythm  Resp:  Normal  effort.  Equal breath sounds bilaterally.  Abd:  No distention.  Soft, nontender.  No rebound or guarding. Other:  Moderate left shoulder tenderness palpation moderate pain with range of motion active and passive.  Neurovascularly intact distally.  No edema.  No erythema.   ED Results / Procedures / Treatments   EKG  EKG viewed and interpreted by myself shows a normal sinus rhythm at 73 bpm with a narrow QRS, normal axis, normal intervals, no concerning ST changes.  RADIOLOGY  I have reviewed and interpreted the chest x-ray images.  No obvious consolidation on my evaluation.    MEDICATIONS ORDERED IN ED: Medications  morphine  (PF) 4 MG/ML injection 4 mg (has no administration in time range)  ondansetron  (ZOFRAN ) injection 4 mg (has no administration in time range)     IMPRESSION / MDM / ASSESSMENT AND PLAN / ED COURSE  I reviewed the triage vital signs and the nursing notes.  Patient's presentation is most consistent with acute presentation with potential threat to life or bodily function.  Patient presents  the emergency department for central chest discomfort for last 2 days described more as an indigestion type sensation.  Will dose a GI cocktail in the emergency department the patient states symptoms have largely resolved.  For the last 1 day patient has been experiencing moderate pain in the left shoulder worse with range of motion.  Patient has good range of motion although with pain in the emergency department no concern for fracture or dislocation.  Denies trauma.  Neurovascularly intact distally.  Patient's lab work today shows a reassuring CBC reassuring chemistry normal troponin.  Chest x-ray appears clear.  EKG shows no concerning findings.  Will dose a GI cocktail in the emergency department will dose pain medication.  Will reassess after GI cocktail.  Patient's workup is reassuring with a normal CBC normal chemistry negative troponin.  Chest x-ray is clear.  Shoulder x-ray  is negative besides calcifications possibly representing calcific tendinitis.  Will discharge with short course of pain medication and have the patient follow-up with her doctor.  Patient denies any chest pain currently.  Discussed my typical chest pain return precautions.  FINAL CLINICAL IMPRESSION(S) / ED DIAGNOSES   Chest pain Left shoulder pain   Note:  This document was prepared using Dragon voice recognition software and may include unintentional dictation errors.   Dorothyann Drivers, MD 01/13/25 1707  "

## 2025-01-19 ENCOUNTER — Other Ambulatory Visit (HOSPITAL_COMMUNITY): Payer: Self-pay

## 2025-01-19 ENCOUNTER — Other Ambulatory Visit: Payer: Self-pay | Admitting: Oncology

## 2025-01-19 DIAGNOSIS — C9210 Chronic myeloid leukemia, BCR/ABL-positive, not having achieved remission: Secondary | ICD-10-CM

## 2025-01-20 ENCOUNTER — Other Ambulatory Visit: Payer: Self-pay

## 2025-01-20 ENCOUNTER — Other Ambulatory Visit (HOSPITAL_COMMUNITY): Payer: Self-pay

## 2025-01-20 MED ORDER — NILOTINIB HCL 200 MG PO CAPS
400.0000 mg | ORAL_CAPSULE | Freq: Two times a day (BID) | ORAL | 1 refills | Status: AC
  Filled 2025-01-20: qty 120, 30d supply, fill #0
  Filled 2025-01-20: qty 112, 28d supply, fill #0

## 2025-01-21 ENCOUNTER — Telehealth: Payer: Self-pay | Admitting: Oncology

## 2025-01-21 ENCOUNTER — Inpatient Hospital Stay: Admitting: Oncology

## 2025-01-21 NOTE — Telephone Encounter (Signed)
 Pt called to r/s her appt today 2/4 for the second time. Pt stated that her transportation canceled on her. I confirmed the next available appt with pt.

## 2025-01-22 ENCOUNTER — Other Ambulatory Visit: Payer: Self-pay

## 2025-01-22 ENCOUNTER — Other Ambulatory Visit (HOSPITAL_COMMUNITY): Payer: Self-pay

## 2025-01-22 ENCOUNTER — Other Ambulatory Visit: Payer: Self-pay | Admitting: Pharmacy Technician

## 2025-01-22 NOTE — Progress Notes (Signed)
 Specialty Pharmacy Refill Coordination Note  Carol Ortega is a 54 y.o. female contacted today regarding refills of specialty medication(s) Nilotinib  HCl (TASIGNA )   Patient requested Delivery   Delivery date: 01/28/25   Verified address: [address] [place]   Medication will be filled on: 01/27/25

## 2025-03-16 ENCOUNTER — Inpatient Hospital Stay: Admitting: Oncology
